# Patient Record
Sex: Female | Born: 1985 | Race: Black or African American | Hispanic: No | Marital: Married | Smoking: Never smoker
Health system: Southern US, Community
[De-identification: ages and names within clinical notes are randomized; demographics above are authoritative.]

## PROBLEM LIST (undated history)

## (undated) ENCOUNTER — Inpatient Hospital Stay: Payer: Self-pay

## (undated) ENCOUNTER — Ambulatory Visit

## (undated) DIAGNOSIS — Z8759 Personal history of other complications of pregnancy, childbirth and the puerperium: Secondary | ICD-10-CM

## (undated) DIAGNOSIS — Z9289 Personal history of other medical treatment: Secondary | ICD-10-CM

## (undated) DIAGNOSIS — D5932 Hereditary hemolytic-uremic syndrome: Secondary | ICD-10-CM

## (undated) DIAGNOSIS — N189 Chronic kidney disease, unspecified: Secondary | ICD-10-CM

## (undated) DIAGNOSIS — Z992 Dependence on renal dialysis: Secondary | ICD-10-CM

## (undated) DIAGNOSIS — E119 Type 2 diabetes mellitus without complications: Secondary | ICD-10-CM

## (undated) DIAGNOSIS — D649 Anemia, unspecified: Secondary | ICD-10-CM

## (undated) DIAGNOSIS — R112 Nausea with vomiting, unspecified: Secondary | ICD-10-CM

## (undated) DIAGNOSIS — T8859XA Other complications of anesthesia, initial encounter: Secondary | ICD-10-CM

## (undated) DIAGNOSIS — I10 Essential (primary) hypertension: Secondary | ICD-10-CM

## (undated) DIAGNOSIS — Z9889 Other specified postprocedural states: Secondary | ICD-10-CM

## (undated) HISTORY — PX: DILATION AND CURETTAGE OF UTERUS: SHX78

## (undated) HISTORY — DX: Essential (primary) hypertension: I10

## (undated) HISTORY — PX: TUBAL LIGATION: SHX77

## (undated) HISTORY — DX: Personal history of other complications of pregnancy, childbirth and the puerperium: Z87.59

## (undated) HISTORY — DX: Chronic kidney disease, unspecified: N18.9

---

## 2013-05-26 DIAGNOSIS — E103219 Type 1 diabetes mellitus with mild nonproliferative diabetic retinopathy with macular edema, unspecified eye: Secondary | ICD-10-CM | POA: Insufficient documentation

## 2013-07-13 DIAGNOSIS — Z8481 Family history of carrier of genetic disease: Secondary | ICD-10-CM | POA: Insufficient documentation

## 2013-12-18 ENCOUNTER — Encounter: Payer: Self-pay | Admitting: Obstetrics and Gynecology

## 2013-12-19 ENCOUNTER — Emergency Department: Payer: Self-pay | Admitting: Emergency Medicine

## 2013-12-25 ENCOUNTER — Encounter: Payer: Self-pay | Admitting: Maternal & Fetal Medicine

## 2013-12-25 LAB — PROTEIN / CREATININE RATIO, URINE
Creatinine, Urine: 216.4 mg/dL — ABNORMAL HIGH (ref 30.0–125.0)
Protein, Random Urine: 59 mg/dL — ABNORMAL HIGH (ref 0–12)
Protein/Creat. Ratio: 273 mg/gCREAT — ABNORMAL HIGH (ref 0–200)

## 2013-12-25 LAB — HEMOGLOBIN A1C: Hemoglobin A1C: 8 % — ABNORMAL HIGH (ref 4.2–6.3)

## 2013-12-25 LAB — COMPREHENSIVE METABOLIC PANEL
Albumin: 3.3 g/dL — ABNORMAL LOW (ref 3.4–5.0)
Alkaline Phosphatase: 65 U/L
Anion Gap: 8 (ref 7–16)
BUN: 10 mg/dL (ref 7–18)
Bilirubin,Total: 0.2 mg/dL (ref 0.2–1.0)
Calcium, Total: 8.7 mg/dL (ref 8.5–10.1)
Chloride: 104 mmol/L (ref 98–107)
Co2: 24 mmol/L (ref 21–32)
Creatinine: 0.59 mg/dL — ABNORMAL LOW (ref 0.60–1.30)
EGFR (African American): >60
EGFR (Non-African Amer.): >60
Glucose: 113 mg/dL — ABNORMAL HIGH (ref 65–99)
Osmolality: 272 (ref 275–301)
Potassium: 3.6 mmol/L (ref 3.5–5.1)
SGOT(AST): 13 U/L — ABNORMAL LOW (ref 15–37)
SGPT (ALT): 14 U/L
Sodium: 136 mmol/L (ref 136–145)
Total Protein: 7.6 g/dL (ref 6.4–8.2)

## 2013-12-25 LAB — TSH: Thyroid Stimulating Horm: 1.83 u[IU]/mL

## 2013-12-26 ENCOUNTER — Encounter: Payer: Self-pay | Admitting: Maternal & Fetal Medicine

## 2014-01-08 ENCOUNTER — Ambulatory Visit: Payer: Self-pay | Admitting: Obstetrics and Gynecology

## 2014-01-25 ENCOUNTER — Ambulatory Visit: Payer: Self-pay | Admitting: Obstetrics and Gynecology

## 2014-02-19 ENCOUNTER — Encounter: Payer: Self-pay | Admitting: Maternal & Fetal Medicine

## 2014-03-20 ENCOUNTER — Encounter: Payer: Self-pay | Admitting: Pediatric Cardiology

## 2014-04-27 DIAGNOSIS — Z8759 Personal history of other complications of pregnancy, childbirth and the puerperium: Secondary | ICD-10-CM

## 2014-04-27 HISTORY — DX: Personal history of other complications of pregnancy, childbirth and the puerperium: Z87.59

## 2014-06-07 ENCOUNTER — Observation Stay: Payer: Self-pay | Admitting: Obstetrics and Gynecology

## 2014-06-17 ENCOUNTER — Observation Stay: Payer: Self-pay | Admitting: Obstetrics and Gynecology

## 2014-06-26 ENCOUNTER — Inpatient Hospital Stay: Payer: Self-pay | Admitting: Obstetrics and Gynecology

## 2014-06-26 DIAGNOSIS — O1400 Mild to moderate pre-eclampsia, unspecified trimester: Secondary | ICD-10-CM

## 2014-08-18 NOTE — Consult Note (Signed)
Referral Information:  Reason for Referral diabetes in pregnancy- PT RESCHEDULED - SKELETON NOTE LEFT IN [place]   Referring Physician [facility]- Dr Rubie Maid   Prenatal Hx 29 yo AAF [phone] with LMP 10/11/13 [facility] 07/18/13 now at 9w 5 d  Pt has type 1 DM diagnosed at age 61 Stopped OCPs this spring as they made her feel poorly  Uses Novolog 70/30 (due to expense of levemir) last HGB A1c was 10, switched to NPH and regular at [facility]   Past Obstetrical Hx 1- female 5 lbs 5 oz spontaneous vaginal delivery 2-TAB 3-TAB 4- TAB 5- TAB 9- current   Perinatal Consult:  LMP 11-Oct-2013   PGyn Hx regular menses  came off OCPs   Past Medical History cont'd Tyoe 1 DM last ophth exam   Soc Hx single   Impression/Recommendations:  Impression NOTE LEFT in [place]  IUP at 9 w 5d  type 1 DM on NPH and regularm elevated hgb A1c at conception   Recommendations Detailed anatomy scan at 18 weeks Fetal echo at 22 weeks Check P/C ratio on spot urine for baseline  Check TSH  Review risk of hypoglycemia and hyperglycemia /DKA  discussneed for consistent diet (Lifestyle referral  ) and insulin use Monitoirng with thrid trimester monthly growth scan, twice weekly NSTs and delivery at 37-39 weeks depending on control   Plan:  Genetic Counseling yes   Prenatal Diagnosis Options Level II [place], fetal echo   Ultrasound at what gestational ages Monthly >24 weeks   Antepartum Testing Twice weekly, Starting at 32 weeks   Delivery Mode Vaginal   Additional Testing Thyroid panel, HbA1C   Delivery at what gestational age [redacted] weeks, earlier if fetopathy   Coding Description: MATERNAL CONDITIONS/HISTORY INDICATION(S).   Abnormal glucose tolerance - if known by anatomy scan.   Diabetes - DM.  Electronic Signatures: Sharyn Creamer (MD)  (Signed 24-Aug-15 15:50)  Authored: Referral, Home Medications, Consult, Impression, Plan, Coding Description   Last Updated: 24-Aug-15 15:50  by Sharyn Creamer (MD)

## 2014-08-18 NOTE — Consult Note (Signed)
Referral Information:  Reason for Referral 29 yo AAF [phone] with LMP 10/11/13 [facility] 07/18/13 now at [redacted]w[redacted]d gestation is referred for recommendations regarding management of type 1 DM in pregnancy   Referring Physician [facility]- Dr Rubie Maid   Prenatal Hx Pt has type 1 DM diagnosed at age 21 Stopped OCPs this spring as they made her feel poorly  Used Novolog 70/30 (due to expense of levemir) last HGB A1c was 10, switched to NPH and regular at [facility] Current regimen is: AM 24N/11R PM 8R HS 10N   Past Obstetrical Hx 1- female 5 lbs 5 oz spontaneous vaginal delivery at [redacted] weeks gestation 2-TAB 3-TAB 4- TAB 5- TAB 9- current   Home Medications: Medication Instructions Status  Prenatal Multivitamins Prenatal Multivitamins oral tablet 1 tab(s) orally once a day Active  HumuLIN N human recombinant 100 units/mL subcutaneous suspension 24 unit(s) subcutaneous once a day before breakfast Active  HumuLIN N human recombinant 100 units/mL subcutaneous suspension 10 unit(s) subcutaneous once a day (at bedtime) Active  HumuLIN R human recombinant 100 units/mL injectable solution 11 unit(s) injectable once a day before breakfast Active  HumuLIN R human recombinant 100 units/mL injectable solution 8 unit(s) injectable once a day before dinner Active   Allergies:   Other -Explain in Comment: Anaphylaxis  Morphine: Itching, Hallucinations  Bactrim: Hives  Vital Signs/Notes:  Nursing Vital Signs: **Vital Signs.:   31-Aug-15 15:52  Vital Signs Type Routine  Temperature Temperature (F) 97.2  Celsius 36.2  Temperature Source oral  Pulse Pulse 87  Respirations Respirations 18  Systolic BP Systolic BP [id number]  Diastolic BP (mmHg) Diastolic BP (mmHg) 68  Pulse Ox % Pulse Ox % 99  Pulse Ox Activity Level  At rest  Oxygen Delivery Room Air/ 21 %   Perinatal Consult:  LMP 11-Oct-2013   PGyn Hx regular menses  came off OCPs   Past Medical History cont'd Type 1 DM - diagnosed at age 42 at  school.  Was hospitalized for initial control.  Has never been admitted subsequently.  No episodes of DKA.  Has seen an internist periodically in [place].  Has not been followed by endocrinology. 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   Soc Hx single, no substances   Review Of Systems:  Fever/Chills No   Cough No   Abdominal Pain No   Diarrhea No   Constipation No   Nausea/Vomiting No   SOB/DOE No   Chest Pain No   Dysuria No   Tolerating Diet Yes   Medications/Allergies Reviewed Medications/Allergies reviewed   Exam:  Today's Weight 164lbs; BMI 26    Additional Lab/Radiology Notes Bedside US today - FHR 163  Blood sugar review: FBSs  100-285  12/12 > 95 2 hr PP breakfast  43-182   4/12 > 120 2 hr PP lunch 80-364  9/12 > 120 2 hr PP dinner 37-150  3/7 > 120 3 episodes of hypoglycemia (< 60) in last 3 weeks   Impression/Recommendations:  Impression 29 yo AAF [phone] with LMP 10/11/13 [facility] 07/18/13 now at [redacted]w[redacted]d gestation is referred for recommendations regarding management of type 1 DM in pregnancy. Hb A1c > 10 at conception.  Poor control on present regimen which patient believes is better control than she has had in  a long time.   Recommendations 1.  Detailed anatomy scan at 18 weeks - she was scheduled here at [facility] for this 2.  Fetal echo at 22 weeks 3.  P/C ratio today on spot urine for baseline  4. TSH today 5. CMP today 6.  Counseled about  hypoglycemia and hyperglycemia/DKA .   7. Because of hypoglycemia, I would not want to increase her insulin.  She believes her control is better now than on previous regimens, but we talked about smoothing out the variability with a steady basal dose and using a short acting insulin.   8.  We discussed the need for consistent  diet.  Lifestyle referral.  We are initiating. 9.  I am referring her to Dr. Gabriel Carina - endocrinologist at [facility] and hope that she can see the patient ASAP.10.  10. ASA 81 mg per day starting now - patient counseled about the safety and rationale. 11. Fetal surveillance  with third trimester monthly growth scan, twice weekly NSTs, weekly AFIs and delivery at 37-39 weeks depending on fetal growth and maternal control.  See below.   Plan:  Genetic Counseling Has appt for first trimester screening this Friday.   Prenatal Diagnosis Options Level II [place], plus fetal echo   Ultrasound at what gestational ages Monthly > 28 weeks   Antepartum Testing Twice weekly, Starting at 32 weeks   Delivery Mode Vaginal   Additional Testing Thyroid panel, HbA1C, P/C ratio, CMP - ordered today   Delivery at what gestational age [redacted] weeks, earlier if fetopathy or poor control   Comment/Plan Thank you for allowing [place] to participate in her care.    Total Time Spent with Patient 45 minutes   >50% of visit spent in couseling/coordination of care yes   Office Use Only [id number]  Level 3 (69min) NEW office consult detailed   Coding Description: MATERNAL CONDITIONS/HISTORY INDICATION(S).   Diabetes - DM.  Electronic Signatures: Dellia Nims (MD)  (Signed 31-Aug-15 17:37)  Authored: Referral, Home Medications, Allergies, Vital Signs/Notes, Consult, Exam, Lab/Radiology Notes, Impression, Plan, Billing, Coding Description   Last Updated: 31-Aug-15 17:37 by Dellia Nims (MD)

## 2014-08-20 LAB — SURGICAL PATHOLOGY

## 2014-09-04 NOTE — H&P (Signed)
L&D Evaluation:  History:  HPI Ms. Kelly Huff is a 29 y.o. [phone] single AAF, LMP 10/12/14, EDD 07/18/14, aat 37.0 weeks who presents for scheduled IOL for Type 1 DM (poorly controlled), and pre-eclampsia (without severe features).   Patient's Medical History Diabetes  Pre-eclampsia (without severe features), headaches,, UTI in pregnancy   Patient's Surgical History D&C  x 4   Medications Pre Natal Vitamins  Aspirin 81 mg, Fiorcet prn, NPH 20U in a.m., 6U in p.m., Humalog slidin scale   Allergies NKDA   Social History none   Family History Non-Contributory   ROS:  ROS All systems were reviewed.  HEENT, CNS, GI, GU, Respiratory, CV, Renal and Musculoskeletal systems were found to be normal.   Exam:  Vital Signs BPs 130s-150s/80s-90s   Urine Protein not completed   General no apparent distress   Mental Status clear   Chest clear   Heart normal sinus rhythm   Abdomen gravid, non-tender   Estimated Fetal Weight Average for gestational age   Fetal Position cephalic   Back no CVAT   Edema 2+  Pitting  up to mid shin bilaterally   Reflexes 2+   Pelvic no external lesions, 2/30/-3   Mebranes Intact   FHT normal rate with no decels, (however 2 decels noted from baseline to 120s lasting 30 secs ~ 1 hr after cytotec placement overnight at midnight)   Fetal Heart Rate 146   Ucx irregular, infrequent, q 15 min   Ucx Pain Scale 5   Skin no rashes   Lymph no lymphadenopathy   Other B+/-/HIV-/ND/NR/RI/VZI/GC-/Cl-/GBS-        Normal 1st trimester screen.  06/26/14: 7.9>9.7/29.3<189    LFTs wnl, uric acid 4.4.  Cr 0.71.  Pending Protein/Cr ratio Last HgbA1c 04/2014: 6.1   Impression:  Impression early labor   Plan:  Plan EFM/NST, monitor contractions and for cervical change, monitor BP, fluids   Comments - Admitted for IOL for pre-eclampsia (without severe features), and Type 1 DM (poorly controlled, with daily BS averaging from 40s to 200s, however HgbA1c  improved since onset of pregnancy).  - Initiate insulin drip protocol while in labor.  Accuchecks q 2 hrs, goal of BS<120. Most recent BS 137.  - Foley bulb placed this a.m., will begin on low dose pitocin.  - PIH labs overall wnl, still pending urine protein studies. Continue to monitor for s/s of severe pre-eclamspia. BPs labile.  - Stadol for pain until epidural desired.   Electronic Signatures: Augusto Gamble (MD)  (Signed [phone] 21:53)  Authored: L&D Evaluation   Last Updated: 02-Mar-16 21:53 by Augusto Gamble (MD)

## 2014-11-13 ENCOUNTER — Encounter: Payer: Self-pay | Admitting: Obstetrics and Gynecology

## 2014-12-09 ENCOUNTER — Other Ambulatory Visit: Payer: Self-pay | Admitting: Obstetrics and Gynecology

## 2015-04-09 ENCOUNTER — Ambulatory Visit: Payer: Self-pay | Admitting: Obstetrics and Gynecology

## 2015-08-13 ENCOUNTER — Emergency Department (HOSPITAL_COMMUNITY): Payer: Self-pay

## 2015-08-13 ENCOUNTER — Encounter (HOSPITAL_COMMUNITY): Payer: Self-pay | Admitting: Emergency Medicine

## 2015-08-13 ENCOUNTER — Emergency Department (HOSPITAL_COMMUNITY)
Admission: EM | Admit: 2015-08-13 | Discharge: 2015-08-13 | Disposition: A | Discharge status: Home / Self Care | Payer: Self-pay | Attending: Emergency Medicine | Admitting: Emergency Medicine

## 2015-08-13 DIAGNOSIS — S92352D Displaced fracture of fifth metatarsal bone, left foot, subsequent encounter for fracture with routine healing: Secondary | ICD-10-CM | POA: Insufficient documentation

## 2015-08-13 DIAGNOSIS — X58XXXD Exposure to other specified factors, subsequent encounter: Secondary | ICD-10-CM | POA: Insufficient documentation

## 2015-08-13 DIAGNOSIS — S92302D Fracture of unspecified metatarsal bone(s), left foot, subsequent encounter for fracture with routine healing: Secondary | ICD-10-CM

## 2015-08-13 DIAGNOSIS — E119 Type 2 diabetes mellitus without complications: Secondary | ICD-10-CM | POA: Insufficient documentation

## 2015-08-13 HISTORY — DX: Type 2 diabetes mellitus without complications: E11.9

## 2015-08-13 MED ORDER — OXYCODONE-ACETAMINOPHEN 5-325 MG PO TABS: 1.0000 | ORAL_TABLET | Freq: Three times a day (TID) | ORAL | Status: DC | PRN

## 2015-08-13 NOTE — ED Notes (Signed)
Left foot fx while in [place] 4/12 and was told she had a fracture. Told to f/u with ortho. Pt reports pain has gotten increasingly worse.

## 2015-08-13 NOTE — Discharge Instructions (Signed)
Please read and follow all provided instructions.  Your diagnoses today include:  1. Fracture of 5th metatarsal, left, with routine healing, subsequent encounter    Tests performed today include:  Vital signs. See below for your results today.   Medications prescribed:   Take as prescribed  You have been prescribed a narcotic medication on an "as needed" basis. Take only as prescribed. Do not drive, operate any machinery or make any important decisions while taking this medication as it is sedating. It may cause constipation take over the counter stool softeners or add fiber to your diet to treat this (Metamucil, Psyllium Fiber, Colace, Miralax) Further refills will need to be obtained from your primary care doctor and will not be prescribed through the Emergency Department. You will test positive on most drug tests while taking this medciation.   Home care instructions:  Follow any educational materials contained in this packet.  Follow-up instructions: Please follow-up with Orthopedics for further evaluation of symptoms and treatment   Return instructions:   Please return to the Emergency Department if you do not get better, if you get worse, or new symptoms OR  - Fever (temperature greater than 101.37F)  - Bleeding that does not stop with holding pressure to the area    -Severe pain (please note that you may be more sore the day after your accident)  - Chest Pain  - Difficulty breathing  - Severe nausea or vomiting  - Inability to tolerate food and liquids  - Passing out  - Skin becoming red around your wounds  - Change in mental status (confusion or lethargy)  - New numbness or weakness     Please return if you have any other emergent concerns.  Additional Information:  Your vital signs today were: BP 153/89 mmHg   Pulse 96   Temp(Src) 98.1 F (36.7 C) (Oral)   Resp 18   Ht 5\' 7"  (1.702 m)   Wt 72.576 kg   BMI 25.05 kg/m2   SpO2 100%   LMP 06/26/2015 If your blood  pressure (BP) was elevated above 135/85 this visit, please have this repeated by your doctor within one month. ---------------

## 2015-08-13 NOTE — ED Provider Notes (Signed)
History  By signing my name below, I, Marlowe Kays, attest that this documentation has been prepared under the direction and in the presence of Shary Decamp, PA-C. Electronically Signed: Marlowe Kays, ED Scribe. 08/13/2015. 1:48 PM.  No chief complaint on file.  The history is provided by the patient and medical records. No language interpreter was used.    HPI Comments:  Kelly Huff is a 30 y.o. female with PMHx of DM who presents to the Emergency Department complaining of a left ankle injury that occurred six days ago in [place]. She states she was visiting and was seen in an ED there and was told the foot was fractured. She rates the pain at 10/10. A splint was applied and crutches were provided and she reports she has been nonweightbearing. Pt has been taking Ibuprofen with minimal relief of the pain. She denies modifying factors. She denies numbness, tingling or weakness of the left foot or LLE, SOB, CP, nausea, vomiting, fever or chills. Pt was told to follow up with an orthopedist but she has not done so yet because her referral is for a specialist in [place] and she needs a local referral.  Past Medical History  Diagnosis Date  . Diabetes mellitus without complication ([place])    History reviewed. No pertinent past surgical history. No family history on file. Social History  Substance Use Topics  . Smoking status: Never Smoker   . Smokeless tobacco: None  . Alcohol Use: No   OB History    No data available     Review of Systems A complete 10 system review of systems was obtained and all systems are negative except as noted in the HPI and PMH.   Allergies  Biaxin; Hydrocodone; and Morphine and related  Home Medications   Prior to Admission medications   Medication Sig Start Date End Date Taking? Authorizing Provider  HYDROcodone-acetaminophen (NORCO) 10-325 MG tablet Take 1 tablet by mouth every 6 (six) hours as needed.   Yes Historical Provider, MD   Triage  Vitals: BP 153/89 mmHg  Pulse 96  Temp(Src) 98.1 F (36.7 C) (Oral)  Resp 18  Ht 5\' 7"  (1.702 m)  Wt 160 lb (72.576 kg)  BMI 25.05 kg/m2  SpO2 100%  Physical Exam  Constitutional: She is oriented to person, place, and time. She appears well-developed and well-nourished.  HENT:  Head: Normocephalic and atraumatic.  Eyes: EOM are normal.  Neck: Normal range of motion.  Cardiovascular: Normal rate and regular rhythm.   Pulmonary/Chest: Effort normal.  Abdominal: Soft.  Musculoskeletal: Normal range of motion.  Left ankle placed in split. Cap refill intact. Motor/sensation intact. TTP along 5th metatarsal.   Neurological: She is alert and oriented to person, place, and time.  Skin: Skin is warm and dry.  Psychiatric: She has a normal mood and affect. Her behavior is normal. Thought content normal.  Nursing note and vitals reviewed.  ED Course  Procedures (including critical care time) DIAGNOSTIC STUDIES: Oxygen Saturation is 100% on RA, normal by my interpretation.   COORDINATION OF CARE: 1:46 PM- Will wait for X-Ray to result and refer to local orthopedist. Pt verbalizes understanding and agrees to plan.  Medications - No data to display  Labs Review Labs Reviewed - No data to display  Imaging Review Dg Foot Complete Left  08/13/2015  CLINICAL DATA:  Fracture of left foot 6 days ago with worsening pain. EXAM: LEFT FOOT - COMPLETE 3+ VIEW COMPARISON:  None. FINDINGS: Fine bony detail obscured  by overlying fiberglass. Fracture involving the base of the fifth metatarsal is evident. No other acute fracture is noted on the current study. No worrisome lytic or sclerotic osseous abnormality. IMPRESSION: Fracture involving the base of the fifth metatarsal. Electronically Signed   By: Misty Stanley M.D.   On: 08/13/2015 13:46   I have personally reviewed and evaluated these images and lab results as part of my medical decision-making.   EKG Interpretation None      MDM  I have  reviewed and evaluated the relevant laboratory values. I have reviewed and evaluated the relevant imaging studies. I have reviewed the relevant previous healthcare records. I obtained HPI from historian.  ED Course:  Assessment: Pt is a 29yF who presents with left foot pain since previous accident. Seen in ED in [place] and had fracture. Here today because she needs referral to Ortho as they referred her to Ortho in [place]. On exam, pt in NAD. Nontoxic/nonseptic appearing. VS. Afebrile. Pt in splint from previous ED. Cap refill intact. Sensation intact. XR shows fx involving base of 5th metatarsal. Continue NWB with crutches. Given analgesia. Plan is to follow up with orthopedics. At time of discharge, Patient is in no acute distress. Vital Signs are stable. Patient is able to ambulate. Patient able to tolerate PO.    Disposition/Plan:  DC Home Additional Verbal discharge instructions given and discussed with patient.  Return precautions given Pt acknowledges and agrees with plan  Supervising Physician Deno Etienne, DO   Final diagnoses:  Fracture of 5th metatarsal, left, with routine healing, subsequent encounter    I personally performed the services described in this documentation, which was scribed in my presence. The recorded information has been reviewed and is accurate.   Shary Decamp, PA-C 08/13/15 [place], DO 08/13/15 1438

## 2015-10-31 ENCOUNTER — Other Ambulatory Visit: Payer: Self-pay | Admitting: Sports Medicine

## 2015-10-31 DIAGNOSIS — S92355D Nondisplaced fracture of fifth metatarsal bone, left foot, subsequent encounter for fracture with routine healing: Secondary | ICD-10-CM

## 2015-11-06 ENCOUNTER — Ambulatory Visit
Admission: RE | Admit: 2015-11-06 | Discharge: 2015-11-06 | Disposition: A | Discharge status: Home / Self Care | Payer: Medicaid Other | Source: Ambulatory Visit | Attending: Sports Medicine | Admitting: Sports Medicine

## 2015-11-06 DIAGNOSIS — S92355D Nondisplaced fracture of fifth metatarsal bone, left foot, subsequent encounter for fracture with routine healing: Secondary | ICD-10-CM

## 2015-11-20 ENCOUNTER — Ambulatory Visit (INDEPENDENT_AMBULATORY_CARE_PROVIDER_SITE_OTHER): Payer: Medicaid Other | Admitting: Obstetrics and Gynecology

## 2015-11-20 ENCOUNTER — Encounter: Payer: Self-pay | Admitting: Obstetrics and Gynecology

## 2015-11-20 VITALS — BP 137/73 | HR 109 | Ht 67.0 in | Wt 161.0 lb

## 2015-11-20 DIAGNOSIS — Z3046 Encounter for surveillance of implantable subdermal contraceptive: Secondary | ICD-10-CM

## 2015-11-20 NOTE — Progress Notes (Signed)
     GYNECOLOGY CLINIC PROCEDURE NOTE  Kelly Huff is a 30 y.o. [phone] here for Nexplanon removal.  Concerning side effects include weight gain, heavier irregular menses, mood changes, and soreness in arm.  Last pap smear was on 12/2013 (?) and was normal.  No other gynecologic concerns. Patient's last menstrual period was 11/06/2015 (approximate).  Nexplanon Removal Patient identified, informed consent performed, consent signed.   Appropriate time out taken. Nexplanon site identified.  Area prepped in usual sterile fashon. One ml of 1% lidocaine was used to anesthetize the area at the distal end of the implant. A small stab incision was made right beside the implant on the distal portion.  The Nexplanon rod was grasped using hemostats and removed without difficulty.  There was minimal blood loss. There were no complications.  2 ml of 1% lidocaine was injected around the incision for post-procedure analgesia.  Steri-strips were applied over the small incision.  A pressure bandage was applied to reduce any bruising.  The patient tolerated the procedure well and was given post procedure instructions.  Patient desires to be hormone free for 1-3 months to allow body to self regulate, and then is unsure of future contraceptive plans (but considering OCPs).   Rubie Maid, MD [facility]

## 2016-01-29 ENCOUNTER — Ambulatory Visit (INDEPENDENT_AMBULATORY_CARE_PROVIDER_SITE_OTHER): Payer: Medicaid Other | Admitting: Obstetrics and Gynecology

## 2016-01-29 ENCOUNTER — Encounter: Payer: Self-pay | Admitting: Obstetrics and Gynecology

## 2016-01-29 VITALS — BP 134/85 | HR 86 | Ht 67.0 in | Wt 179.6 lb

## 2016-01-29 DIAGNOSIS — Z01419 Encounter for gynecological examination (general) (routine) without abnormal findings: Secondary | ICD-10-CM

## 2016-01-29 DIAGNOSIS — E109 Type 1 diabetes mellitus without complications: Secondary | ICD-10-CM

## 2016-01-29 DIAGNOSIS — E8770 Fluid overload, unspecified: Secondary | ICD-10-CM | POA: Insufficient documentation

## 2016-01-29 DIAGNOSIS — Z0001 Encounter for general adult medical examination with abnormal findings: Secondary | ICD-10-CM | POA: Diagnosis not present

## 2016-01-29 DIAGNOSIS — R6889 Other general symptoms and signs: Secondary | ICD-10-CM | POA: Insufficient documentation

## 2016-01-29 DIAGNOSIS — Z113 Encounter for screening for infections with a predominantly sexual mode of transmission: Secondary | ICD-10-CM

## 2016-01-29 DIAGNOSIS — Z3009 Encounter for other general counseling and advice on contraception: Secondary | ICD-10-CM

## 2016-01-29 NOTE — Progress Notes (Signed)
GYNECOLOGY ANNUAL PHYSICAL EXAM PROGRESS NOTE  Subjective:    Kelly Huff is a 30 y.o. [phone] female who presents for an annual exam. The patient is not currently sexually active. The patient wears seatbelts: yes. The patient participates in regular exercise: no. Has the patient ever been transfused or tattooed?: yes, tattoos. The patient reports that there is not domestic violence in her life.   The patient wishes to address the following today:  1. Notes that she needs a referral to another Endocrinologist.  Notes that she was dismissed from previous practice at [facility] due to having to reschedule too many appointments.  2. Patient desires to discuss her options for contraception.  Currently not sexually active, but plans to be in the near future. Also notes that she desires STD testing as she found out that her last partner was unfaithful.   Gynecologic History Menarche age: 89 Patient's last menstrual period was 01/22/2016 (approximate). Contraception: condoms (however uses inconsistently) History of STI's: Denies Last Pap: 12/2013. Results were: normal.  Denies h/o abnormal pap smears.    Obstetric History   G6   P2   T2   P0   A4   L0    SAB4   TAB0   Ectopic0   Multiple0   Live Births2     # Outcome Date GA Lbr Len/2nd Weight Sex Delivery Anes PTL Lv  6 Term 06/2014 [redacted]w[redacted]d    Vag-Spont        Complications: Mild pre-eclampsia  5 TAB           4 TAB           3 TAB           2 TAB           1 Term               Past Medical History:  Diagnosis Date  . Diabetes mellitus without complication [facility])     Past Surgical History:  Procedure Laterality Date  . DILATION AND CURETTAGE OF UTERUS     x 4    Family History  Problem Relation Age of Onset  . Hypertension Mother   . Cancer Mother     Uterine vs cervical (pt unsure),   . Schizophrenia Brother     Social History   Social History  . Marital status: Single    Spouse name: N/A  . Number of  children: N/A  . Years of education: N/A   Occupational History  . Not on file.   Social History Main Topics  . Smoking status: Never Smoker  . Smokeless tobacco: Never Used  . Alcohol use No  . Drug use: No  . Sexual activity: Yes    Birth control/ protection: Implant   Other Topics Concern  . Not on file   Social History Narrative  . No narrative on file    No current outpatient prescriptions on file prior to visit.   No current facility-administered medications on file prior to visit.     Allergies  Allergen Reactions  . Morphine     Other reaction(s): Hallucination  . Sulfamethoxazole-Trimethoprim Hives  . Biaxin [Clarithromycin]   . Hydrocodone Itching  . Morphine And Related     Review of Systems Constitutional: negative for chills, fatigue, fevers and sweats Eyes: negative for irritation, redness and visual disturbance Ears, nose, mouth, throat, and face: negative for hearing loss, nasal congestion, snoring and tinnitus Respiratory: negative for asthma,  cough, sputum Cardiovascular: negative for chest pain, dyspnea, exertional chest pressure/discomfort, irregular heart beat, palpitations and syncope Gastrointestinal: negative for abdominal pain, change in bowel habits, nausea and vomiting Genitourinary: negative for abnormal menstrual periods, genital lesions, sexual problems and vaginal discharge, dysuria and urinary incontinence Integument/breast: negative for breast lump, breast tenderness and nipple discharge Hematologic/lymphatic: negative for bleeding and easy bruising Musculoskeletal:negative for back pain and muscle weakness Neurological: negative for dizziness, headaches, vertigo and weakness Endocrine: negative for diabetic symptoms including polydipsia, polyuria and skin dryness Allergic/Immunologic: negative for hay fever and urticaria       Objective:  Blood pressure 134/85, pulse 86, height 5\' 7"  (1.702 m), weight 179 lb 9.6 oz (81.5 kg),  last menstrual period 01/22/2016. Body mass index is 28.13 kg/m.  General Appearance:    Alert, cooperative, no distress, appears stated age  Head:    Normocephalic, without obvious abnormality, atraumatic  Eyes:    PERRL, conjunctiva/corneas clear, EOM's intact, both eyes  Ears:    Normal external ear canals, both ears  Nose:   Nares normal, septum midline, mucosa normal, no drainage or sinus tenderness  Throat:   Lips, mucosa, and tongue normal; teeth and gums normal  Neck:   Supple, symmetrical, trachea midline, no adenopathy; thyroid: no enlargement/tenderness/nodules; no carotid bruit or JVD  Back:     Symmetric, no curvature, ROM normal, no CVA tenderness  Lungs:     Clear to auscultation bilaterally, respirations unlabored  Chest Wall:    No tenderness or deformity   Heart:    Regular rate and rhythm, S1 and S2 normal, no murmur, rub or gallop  Breast Exam:    No tenderness, masses, or nipple abnormality  Abdomen:     Soft, non-tender, bowel sounds active all four quadrants, no masses, no organomegaly.    Genitalia:    Pelvic:external genitalia normal, vagina without lesions, discharge, or tenderness, rectovaginal septum  normal. Cervix normal in appearance, no cervical motion tenderness, no adnexal masses or tenderness.  Uterus normal size, shape, mobile, regular contours, nontender.  Rectal:    Normal external sphincter.  No hemorrhoids appreciated. Internal exam not done.   Extremities:   Extremities normal, atraumatic, no cyanosis or edema  Pulses:   2+ and symmetric all extremities  Skin:   Skin color, texture, turgor normal, no rashes or lesions  Lymph nodes:   Cervical, supraclavicular, and axillary nodes normal  Neurologic:   CNII-XII intact, normal strength, sensation and reflexes throughout   .  Labs:  No results found for: WBC, HGB, HCT, MCV, PLT  Lab Results  Component Value Date   CREATININE 0.59 (L) 12/25/2013   BUN 10 12/25/2013   NA 136 12/25/2013   K 3.6  12/25/2013   CL 104 12/25/2013   CO2 24 12/25/2013    Lab Results  Component Value Date   ALT 14 12/25/2013   AST 13 (L) 12/25/2013   ALKPHOS 65 12/25/2013   BILITOT 0.2 12/25/2013    Lab Results  Component Value Date   TSH 1.83 12/25/2013     Assessment:   Healthy female exam.   Type 1 DM Desires STD screening Contraception counseling Family h/o cancer   Plan:    - Blood tests: CBC with diff, Comprehensive metabolic panel and [id number]. - Breast self exam technique reviewed and patient encouraged to perform self-exam monthly. - Contraception: currently abstinent, was using condoms but desires to discuss contraception. Education given regarding options for contraception, including injectable contraception, IUD placement, oral contraceptives,  Nuvaring, Implanon. Patient unsure, would like to start OCPs for now until final decision was made.  Given samples of [place]. To begin on Sunday of next menses. - Discussed healthy lifestyle modifications. - Pap smear up to date.  Next pap smear due next year.  - Desires to postpone flu vaccine. May get it at work.  - Referral placed for new Endocrinologist for management of Type 1 DM.  - STD screening performed today at patient's request.  - Patient uncertain which type of cancer her mother has, however is a reproductive cancer, currently having a recurrence requiring a mastectomy.  Discussed genetic cancer testing, encouraged patient to find out which type her mother has to see if her mother or patient is a candidate for testing.     Rubie Maid, MD [facility]

## 2016-01-30 LAB — COMPREHENSIVE METABOLIC PANEL
ALT: 11 IU/L (ref 0–32)
AST: 14 IU/L (ref 0–40)
Albumin/Globulin Ratio: 1.2 (ref 1.2–2.2)
Albumin: 3.9 g/dL (ref 3.5–5.5)
Alkaline Phosphatase: 112 IU/L (ref 39–117)
BUN/Creatinine Ratio: 21 (ref 9–23)
BUN: 20 mg/dL (ref 6–20)
Bilirubin Total: <0.2 mg/dL (ref 0.0–1.2)
CO2: 22 mmol/L (ref 18–29)
Calcium: 9.2 mg/dL (ref 8.7–10.2)
Chloride: 102 mmol/L (ref 96–106)
Creatinine, Ser: 0.95 mg/dL (ref 0.57–1.00)
GFR calc Af Amer: 92 mL/min/{1.73_m2} (ref >59)
GFR calc non Af Amer: 80 mL/min/{1.73_m2} (ref >59)
Globulin, Total: 3.3 g/dL (ref 1.5–4.5)
Glucose: 176 mg/dL — ABNORMAL HIGH (ref 65–99)
Potassium: 4.1 mmol/L (ref 3.5–5.2)
Sodium: 139 mmol/L (ref 134–144)
Total Protein: 7.2 g/dL (ref 6.0–8.5)

## 2016-01-30 LAB — HEPATITIS B SURFACE ANTIGEN: Hepatitis B Surface Ag: NEGATIVE

## 2016-01-30 LAB — HEMOGLOBIN A1C
Est. average glucose Bld gHb Est-mCnc: 212 mg/dL
Hgb A1c MFr Bld: 9 % — ABNORMAL HIGH (ref 4.8–5.6)

## 2016-01-30 LAB — CBC
Hematocrit: 31.1 % — ABNORMAL LOW (ref 34.0–46.6)
Hemoglobin: 10.3 g/dL — ABNORMAL LOW (ref 11.1–15.9)
MCH: 27.7 pg (ref 26.6–33.0)
MCHC: 33.1 g/dL (ref 31.5–35.7)
MCV: 84 fL (ref 79–97)
Platelets: 376 10*3/uL (ref 150–379)
RBC: 3.72 x10E6/uL — ABNORMAL LOW (ref 3.77–5.28)
RDW: 13.3 % (ref 12.3–15.4)
WBC: 5 10*3/uL (ref 3.4–10.8)

## 2016-01-30 LAB — HIV ANTIBODY (ROUTINE TESTING W REFLEX): HIV Screen 4th Generation wRfx: NONREACTIVE

## 2016-01-30 LAB — MICROALBUMIN / CREATININE URINE RATIO
Creatinine, Urine: 139.7 mg/dL
Microalb/Creat Ratio: 633.9 mg/g creat — ABNORMAL HIGH (ref 0.0–30.0)
Microalbumin, Urine: 885.6 ug/mL

## 2016-01-30 LAB — RPR: RPR Ser Ql: NONREACTIVE

## 2016-01-31 ENCOUNTER — Encounter: Payer: Self-pay | Admitting: Obstetrics and Gynecology

## 2016-02-01 LAB — GC/CHLAMYDIA PROBE AMP
Chlamydia trachomatis, NAA: NEGATIVE
Neisseria gonorrhoeae by PCR: NEGATIVE

## 2016-02-04 ENCOUNTER — Telehealth: Payer: Self-pay

## 2016-02-04 DIAGNOSIS — R7309 Other abnormal glucose: Secondary | ICD-10-CM

## 2016-02-04 NOTE — Telephone Encounter (Signed)
Called pt, no answer. LM for pt informing her of information below. Referral placed.

## 2016-02-04 NOTE — Telephone Encounter (Signed)
-----   Message from Rubie Maid, MD sent at 02/02/2016 12:48 AM EDT ----- Please inform patient of the following lab abnormalities:  1) Hgb A1c is elevated (currently at 9.0). Please f/u with referral for new endocrinologist.  2) Hgb is borderline low (she is slightly anemic). Would recommend a daily iron supplement for 2 months.   All other labs, including STD screening is normal.

## 2017-01-05 ENCOUNTER — Ambulatory Visit (INDEPENDENT_AMBULATORY_CARE_PROVIDER_SITE_OTHER): Payer: Medicaid Other | Admitting: Obstetrics and Gynecology

## 2017-01-05 ENCOUNTER — Encounter: Payer: Self-pay | Admitting: Obstetrics and Gynecology

## 2017-01-05 VITALS — BP 139/83 | HR 89 | Ht 67.0 in | Wt 199.4 lb

## 2017-01-05 DIAGNOSIS — Z202 Contact with and (suspected) exposure to infections with a predominantly sexual mode of transmission: Secondary | ICD-10-CM

## 2017-01-05 DIAGNOSIS — B9689 Other specified bacterial agents as the cause of diseases classified elsewhere: Secondary | ICD-10-CM

## 2017-01-05 DIAGNOSIS — N760 Acute vaginitis: Secondary | ICD-10-CM | POA: Diagnosis not present

## 2017-01-05 DIAGNOSIS — N898 Other specified noninflammatory disorders of vagina: Secondary | ICD-10-CM

## 2017-01-05 MED ORDER — METRONIDAZOLE 0.75 % VA GEL: 1.0000 | Freq: Every day | VAGINAL | 0 refills | Status: DC

## 2017-01-05 NOTE — Progress Notes (Signed)
Chief complaint: 1. STD exposure 2. Vaginal discharge  Patient recently broke up with her fianc because of infidelity. Patient now notes vaginal discharge without significant itching burning or odor.  Past medical history, past surgical history, problem list, medications, and allergies are reviewed  Review of Systems  Constitutional: Negative for chills, diaphoresis, fever and malaise/fatigue.  Respiratory: Negative.   Cardiovascular: Negative.   Gastrointestinal: Negative.  Negative for abdominal pain.  Genitourinary: Negative for dysuria and urgency.  Musculoskeletal: Negative.   Skin: Negative for itching and rash.  Neurological: Negative.   Endo/Heme/Allergies: Negative.   Psychiatric/Behavioral: Negative.    OBJECTIVE: BP 139/83   Pulse 89   Ht 5\' 7"  (1.702 m)   Wt 199 lb 6.4 oz (90.4 kg)   LMP 11/29/2016 (Approximate)   BMI 31.23 kg/m  Pleasant female in no acute distress. Slightly anxious. Abdomen: Soft, nontender Pelvic exam: External genitalia-normal BUS-normal Vagina-minimal white secretions; no lesions Cervix-no lesions; no cervical motion tenderness Uterus-midplane, nontender, mobile Adnexa-nonpalpable and nontender Rectovaginal-normal external exam  PROCEDURE: Wet prep KOH-negative for yeast; positive whiff test Normal saline-clue cells present; no significant white blood cells; no Trichomonas   ASSESSMENT: 1. STD exposure 2. Suspected bacterial vaginosis  PLAN: 1. Nu swab plus 2. Wet prep is noted 3. Blood STI screen 4. MetroGel intravaginal daily 5 days 5. Follow-up as needed 6. Questions regarding STDs addressed including signs and symptoms of herpes.  A total of 15 minutes were spent face-to-face with the patient during this encounter and over half of that time dealt with counseling and coordination of care.  Brayton Mars, MD  Note: This dictation was prepared with Dragon dictation along with smaller phrase technology. Any  transcriptional errors that result from this process are unintentional.

## 2017-01-05 NOTE — Patient Instructions (Addendum)
1. STD testing is completed today 2. Wet prep is suspicious for bacterial vaginosis 3. MetroGel cream intravaginal daily for 5 days is prescribed 4. Labs will be made available to patient  Sexually Transmitted Disease A sexually transmitted disease (STD) is a disease or infection that may be passed (transmitted) from person to person, usually during sexual activity. This may happen by way of saliva, semen, blood, vaginal mucus, or urine. Common STDs include:  Gonorrhea.  Chlamydia.  Syphilis.  HIV and AIDS.  Genital herpes.  Hepatitis B and C.  Trichomonas.  Human papillomavirus (HPV).  Pubic lice.  Scabies.  Mites.  Bacterial vaginosis.  What are the causes? An STD may be caused by bacteria, a virus, or parasites. STDs are often transmitted during sexual activity if one person is infected. However, they may also be transmitted through nonsexual means. STDs may be transmitted after:  Sexual intercourse with an infected person.  Sharing sex toys with an infected person.  Sharing needles with an infected person or using unclean piercing or tattoo needles.  Having intimate contact with the genitals, mouth, or rectal areas of an infected person.  Exposure to infected fluids during birth.  What are the signs or symptoms? Different STDs have different symptoms. Some people may not have any symptoms. If symptoms are present, they may include:  Painful or bloody urination.  Pain in the pelvis, abdomen, vagina, anus, throat, or eyes.  A skin rash, itching, or irritation.  Growths, ulcerations, blisters, or sores in the genital and anal areas.  Abnormal vaginal discharge with or without bad odor.  Penile discharge in men.  Fever.  Pain or bleeding during sexual intercourse.  Swollen glands in the groin area.  Yellow skin and eyes (jaundice). This is seen with hepatitis.  Swollen testicles.  Infertility.  Sores and blisters in the mouth.  How is this  diagnosed? To make a diagnosis, your health care provider may:  Take a medical history.  Perform a physical exam.  Take a sample of any discharge to examine.  Swab the throat, cervix, opening to the penis, rectum, or vagina for testing.  Test a sample of your first morning urine.  Perform blood tests.  Perform a Pap test, if this applies.  Perform a colposcopy.  Perform a laparoscopy.  How is this treated? Treatment depends on the STD. Some STDs may be treated but not cured.  Chlamydia, gonorrhea, trichomonas, and syphilis can be cured with antibiotic medicine.  Genital herpes, hepatitis, and HIV can be treated, but not cured, with prescribed medicines. The medicines lessen symptoms.  Genital warts from HPV can be treated with medicine or by freezing, burning (electrocautery), or surgery. Warts may come back.  HPV cannot be cured with medicine or surgery. However, abnormal areas may be removed from the cervix, vagina, or vulva.  If your diagnosis is confirmed, your recent sexual partners need treatment. This is true even if they are symptom-free or have a negative culture or evaluation. They should not have sex until their health care providers say it is okay.  Your health care provider may test you for infection again 3 months after treatment.  How is this prevented? Take these steps to reduce your risk of getting an STD:  Use latex condoms, dental dams, and water-soluble lubricants during sexual activity. Do not use petroleum jelly or oils.  Avoid having multiple sex partners.  Do not have sex with someone who has other sex partners.  Do not have sex with anyone  you do not know or who is at high risk for an STD.  Avoid risky sex practices that can break your skin.  Do not have sex if you have open sores on your mouth or skin.  Avoid drinking too much alcohol or taking illegal drugs. Alcohol and drugs can affect your judgment and put you in a vulnerable  position.  Avoid engaging in oral and anal sex acts.  Get vaccinated for HPV and hepatitis. If you have not received these vaccines in the past, talk to your health care provider about whether one or both might be right for you.  If you are at risk of being infected with HIV, it is recommended that you take a prescription medicine daily to prevent HIV infection. This is called pre-exposure prophylaxis (PrEP). You are considered at risk if: ? You are a man who has sex with other men (MSM). ? You are a heterosexual man or woman and are sexually active with more than one partner. ? You take drugs by injection. ? You are sexually active with a partner who has HIV.  Talk with your health care provider about whether you are at high risk of being infected with HIV. If you choose to begin PrEP, you should first be tested for HIV. You should then be tested every 3 months for as long as you are taking PrEP.  Contact a health care provider if:  See your health care provider.  Tell your sexual partner(s). They should be tested and treated for any STDs.  Do not have sex until your health care provider says it is okay. Get help right away if: Contact your health care provider right away if:  You have severe abdominal pain.  You are a man and notice swelling or pain in your testicles.  You are a woman and notice swelling or pain in your vagina.  This information is not intended to replace advice given to you by your health care provider. Make sure you discuss any questions you have with your health care provider. Document Released: 07/04/2002 Document Revised: 11/01/2015 Document Reviewed: 11/01/2012 Elsevier Interactive Patient Education  2018 [employer].

## 2017-01-06 LAB — HSV(HERPES SIMPLEX VRS) I + II AB-IGG
HSV 1 Glycoprotein G Ab, IgG: 49.7 index — ABNORMAL HIGH (ref 0.00–0.90)
HSV 2 IgG, Type Spec: 12.4 index — ABNORMAL HIGH (ref 0.00–0.90)

## 2017-01-06 LAB — HEPATITIS C ANTIBODY: Hep C Virus Ab: 0.2 s/co ratio (ref 0.0–0.9)

## 2017-01-06 LAB — HIV ANTIBODY (ROUTINE TESTING W REFLEX): HIV Screen 4th Generation wRfx: NONREACTIVE

## 2017-01-06 LAB — HEPATITIS B SURFACE ANTIGEN: Hepatitis B Surface Ag: NEGATIVE

## 2017-01-06 LAB — RPR: RPR Ser Ql: NONREACTIVE

## 2017-01-07 ENCOUNTER — Encounter: Payer: Self-pay | Admitting: Obstetrics and Gynecology

## 2017-01-07 LAB — NUSWAB VAGINITIS PLUS (VG+)
Atopobium vaginae: HIGH Score — AB
BVAB 2: HIGH Score — AB
Candida albicans, NAA: NEGATIVE
Candida glabrata, NAA: NEGATIVE
Chlamydia trachomatis, NAA: NEGATIVE
Megasphaera 1: HIGH Score — AB
Neisseria gonorrhoeae, NAA: NEGATIVE
Trich vag by NAA: NEGATIVE

## 2017-01-19 ENCOUNTER — Encounter: Payer: Medicaid Other | Admitting: Obstetrics and Gynecology

## 2017-01-21 ENCOUNTER — Encounter: Payer: Self-pay | Admitting: Obstetrics and Gynecology

## 2017-01-21 ENCOUNTER — Ambulatory Visit (INDEPENDENT_AMBULATORY_CARE_PROVIDER_SITE_OTHER): Payer: Medicaid Other | Admitting: Obstetrics and Gynecology

## 2017-01-21 VITALS — BP 143/93 | HR 83 | Ht 67.0 in | Wt 198.0 lb

## 2017-01-21 DIAGNOSIS — E109 Type 1 diabetes mellitus without complications: Secondary | ICD-10-CM | POA: Diagnosis not present

## 2017-01-21 DIAGNOSIS — E669 Obesity, unspecified: Secondary | ICD-10-CM | POA: Diagnosis not present

## 2017-01-21 DIAGNOSIS — Z124 Encounter for screening for malignant neoplasm of cervix: Secondary | ICD-10-CM

## 2017-01-21 DIAGNOSIS — N938 Other specified abnormal uterine and vaginal bleeding: Secondary | ICD-10-CM | POA: Diagnosis not present

## 2017-01-21 DIAGNOSIS — Z0000 Encounter for general adult medical examination without abnormal findings: Secondary | ICD-10-CM | POA: Diagnosis not present

## 2017-01-21 DIAGNOSIS — I10 Essential (primary) hypertension: Secondary | ICD-10-CM

## 2017-01-21 DIAGNOSIS — Z01419 Encounter for gynecological examination (general) (routine) without abnormal findings: Secondary | ICD-10-CM | POA: Diagnosis not present

## 2017-01-21 MED ORDER — TRANEXAMIC ACID 650 MG PO TABS: 1300.0000 mg | ORAL_TABLET | Freq: Three times a day (TID) | ORAL | 2 refills | Status: DC

## 2017-01-21 NOTE — Addendum Note (Signed)
Addended by: Donalee Citrin on: 01/21/2017 09:41 AM   Modules accepted: Orders

## 2017-01-21 NOTE — Progress Notes (Signed)
GYNECOLOGY ANNUAL PHYSICAL EXAM PROGRESS NOTE  Subjective:    Kelly Huff is a 31 y.o. [phone] female who presents for an annual exam. The patient is not currently sexually active. The patient wears seatbelts: yes. The patient participates in regular exercise: no. Has the patient ever been transfused or tattooed?: yes, tattoos. The patient reports that there is not domestic violence in her life.   The patient wishes to address the following today:  1. Patient complains of heavy periods, with passage of clots.  This has been ongoing for the past year (after Nexplanon was removed).  Periods also tend to be shorter. Last period also had what looked like "tissue".  Unsure if this could have been a miscarriage. Periods are still regular, occurring ~ every 28-35 days.  Gynecologic History Menarche age: 19 Patient's last menstrual period was 01/14/2017. Contraception: none  History of STI's: Denies Last Pap: 12/2013. Results were: normal.  Denies h/o abnormal pap smears.    Obstetric History   G6   P2   T2   P0   A4   L2    SAB0   TAB4   Ectopic0   Multiple0   Live Births2     # Outcome Date GA Lbr Len/2nd Weight Sex Delivery Anes PTL Lv  6 Term 06/2014 [redacted]w[redacted]d   M Vag-Spont   LIV     Complications: Mild pre-eclampsia  5 TAB           4 TAB           3 TAB           2 TAB           1 Term     M Vag-Spont   LIV      Past Medical History:  Diagnosis Date  . Diabetes mellitus without complication ([place])   . History of pre-eclampsia 2016   mild  . Hypertension     Past Surgical History:  Procedure Laterality Date  . DILATION AND CURETTAGE OF UTERUS     x 4    Family History  Problem Relation Age of Onset  . Hypertension Mother   . Cancer Mother        Uterine vs cervical (pt unsure),   . Schizophrenia Brother     Social History   Social History  . Marital status: Single    Spouse name: N/A  . Number of children: N/A  . Years of education: N/A   Occupational  History  . Not on file.   Social History Main Topics  . Smoking status: Never Smoker  . Smokeless tobacco: Never Used  . Alcohol use No  . Drug use: No  . Sexual activity: Yes    Birth control/ protection: None   Other Topics Concern  . Not on file   Social History Narrative  . No narrative on file    Current Outpatient Prescriptions on File Prior to Visit  Medication Sig Dispense Refill  . metoprolol tartrate (LOPRESSOR) 25 MG tablet Take 25 mg by mouth 2 (two) times daily.    . metroNIDAZOLE (METROGEL VAGINAL) 0.75 % vaginal gel Place 1 Applicatorful vaginally at bedtime. For 5 nights 70 g 0  . NOVOLOG FLEXPEN 100 UNIT/ML FlexPen TAKE 3-10 UNITS UP TO 4 TIMES DAILY AS DIRECTED  12   No current facility-administered medications on file prior to visit.     Allergies  Allergen Reactions  . Morphine     Other reaction(s):  Hallucination  . Sulfamethoxazole-Trimethoprim Hives  . Biaxin [Clarithromycin]   . Hydrocodone Itching  . Morphine And Related     Review of Systems Constitutional: negative for chills, fatigue, fevers and sweats Eyes: negative for irritation, redness and visual disturbance Ears, nose, mouth, throat, and face: negative for hearing loss, nasal congestion, snoring and tinnitus Respiratory: negative for asthma, cough, sputum Cardiovascular: negative for chest pain, dyspnea, exertional chest pressure/discomfort, irregular heart beat, palpitations and syncope Gastrointestinal: negative for abdominal pain, change in bowel habits, nausea and vomiting Genitourinary: positive for abnormal menstrual periods (see HPI). Negative for genital lesions, sexual problems and vaginal discharge, dysuria and urinary incontinence Integument/breast: negative for breast lump, breast tenderness and nipple discharge Hematologic/lymphatic: negative for bleeding and easy bruising Musculoskeletal:negative for back pain and muscle weakness Neurological: negative for dizziness,  headaches, vertigo and weakness Endocrine: negative for diabetic symptoms including polydipsia, polyuria and skin dryness Allergic/Immunologic: negative for hay fever and urticaria       Objective:  Blood pressure (!) 143/93, pulse 83, height 5\' 7"  (1.702 m), weight 198 lb (89.8 kg), last menstrual period 01/14/2017. Body mass index is 31.01 kg/m.  General Appearance:    Alert, cooperative, no distress, appears stated age, mild obesity  Head:    Normocephalic, without obvious abnormality, atraumatic  Eyes:    PERRL, conjunctiva/corneas clear, EOM's intact, both eyes  Ears:    Normal external ear canals, both ears  Nose:   Nares normal, septum midline, mucosa normal, no drainage or sinus tenderness  Throat:   Lips, mucosa, and tongue normal; teeth and gums normal  Neck:   Supple, symmetrical, trachea midline, no adenopathy; thyroid: no enlargement/tenderness/nodules; no carotid bruit or JVD  Back:     Symmetric, no curvature, ROM normal, no CVA tenderness  Lungs:     Clear to auscultation bilaterally, respirations unlabored  Chest Wall:    No tenderness or deformity   Heart:    Regular rate and rhythm, S1 and S2 normal, no murmur, rub or gallop  Breast Exam:    No tenderness, masses, or nipple abnormality  Abdomen:     Soft, non-tender, bowel sounds active all four quadrants, no masses, no organomegaly.    Genitalia:    Pelvic:external genitalia normal, vagina without lesions, discharge, or tenderness, rectovaginal septum  normal. Cervix normal in appearance, no cervical motion tenderness, no adnexal masses or tenderness.  Uterus normal size, shape, mobile, regular contours, nontender.  Rectal:    Normal external sphincter.  No hemorrhoids appreciated. Internal exam not done.   Extremities:   Extremities normal, atraumatic, no cyanosis or edema  Pulses:   2+ and symmetric all extremities  Skin:   Skin color, texture, turgor normal, no rashes or lesions  Lymph nodes:   Cervical,  supraclavicular, and axillary nodes normal  Neurologic:   CNII-XII intact, normal strength, sensation and reflexes throughout   .  Labs:  Lab Results  Component Value Date   WBC 5.0 01/29/2016   HGB 10.3 (L) 01/29/2016   HCT 31.1 (L) 01/29/2016   MCV 84 01/29/2016   PLT 376 01/29/2016    Lab Results  Component Value Date   CREATININE 0.95 01/29/2016   BUN 20 01/29/2016   NA 139 01/29/2016   K 4.1 01/29/2016   CL 102 01/29/2016   CO2 22 01/29/2016    Lab Results  Component Value Date   ALT 11 01/29/2016   AST 14 01/29/2016   ALKPHOS 112 01/29/2016   BILITOT <0.2 01/29/2016  Lab Results  Component Value Date   TSH 1.83 12/25/2013     Assessment:   Routine gynecologic exam.   Type 1 DM HTN Family planning DUB Family h/o cancer   Plan:    - Blood tests: CBC with diff, Comprehensive metabolic panel and Lipid Panel and TSH. - Breast self exam technique reviewed and patient encouraged to perform self-exam monthly. - Contraception: none. She and partner are considering conceiving. Notes that they have not been using protection for the past 6-8 months, is worried about possible problems with conception due to her changing periods, and also wonders if her partner should be tested (not the father of her other 2 children). Discussed workup usually indicated for infertility > 1 year, but patient insistent.  Will give instructions on semen analysis for partner to collect sample. Also advised patient on ovulation app on phone as she does not keep up with her cycles, and timed coitus.  - Discussed healthy lifestyle modifications. - Pap smear performed today. - Desires to postpone flu vaccine here. Will get it at work.  -  Endocrinologist for management of Type 1 DM (Dr. Jackson Latino in [place]). Last HgbA1c 8.5 several months ago. Discussed improving her health could have a positive impact on her fertility.  - Patient uncertain which type of cancer her mother has, however  is a reproductive cancer, currently having a recurrence requiring a mastectomy.  Discussed genetic cancer testing, encouraged patient to find out which type her mother has to see if her mother or patient is a candidate for testing.  - DUB over several months.  Patient desiring conception, declines hormonal management with OCPs. Will prescribe Lysteda.  Will also order HCG level to assess if most recent period could have possibly been a miscarriage due to description of bleeding (lasted only 1-2 days, heavy bleeding, passage of clots and possible "tissue").  HTN managed by PCP.  - Follow up in 3 months if no success with Lysteda, or in 1 year if all resolves for next annual exam.    Rubie Maid, MD [facility]

## 2017-01-26 LAB — IGP, COBASHPV16/18
HPV 16: NEGATIVE
HPV 18: NEGATIVE
HPV other hr types: NEGATIVE
PAP Smear Comment: 0

## 2017-04-13 ENCOUNTER — Inpatient Hospital Stay (HOSPITAL_COMMUNITY)
Admission: EM | Admit: 2017-04-13 | Discharge: 2017-04-16 | DRG: 638 | Disposition: A | Discharge status: Home / Self Care | Payer: Medicaid Other | Attending: Internal Medicine | Admitting: Internal Medicine

## 2017-04-13 ENCOUNTER — Other Ambulatory Visit: Payer: Self-pay

## 2017-04-13 ENCOUNTER — Encounter (HOSPITAL_COMMUNITY): Payer: Self-pay | Admitting: Emergency Medicine

## 2017-04-13 DIAGNOSIS — E138 Other specified diabetes mellitus with unspecified complications: Secondary | ICD-10-CM | POA: Diagnosis not present

## 2017-04-13 DIAGNOSIS — E119 Type 2 diabetes mellitus without complications: Secondary | ICD-10-CM

## 2017-04-13 DIAGNOSIS — E10649 Type 1 diabetes mellitus with hypoglycemia without coma: Secondary | ICD-10-CM | POA: Diagnosis not present

## 2017-04-13 DIAGNOSIS — E876 Hypokalemia: Secondary | ICD-10-CM | POA: Diagnosis present

## 2017-04-13 DIAGNOSIS — Z8481 Family history of carrier of genetic disease: Secondary | ICD-10-CM

## 2017-04-13 DIAGNOSIS — Z794 Long term (current) use of insulin: Secondary | ICD-10-CM

## 2017-04-13 DIAGNOSIS — E870 Hyperosmolality and hypernatremia: Secondary | ICD-10-CM | POA: Diagnosis present

## 2017-04-13 DIAGNOSIS — E103219 Type 1 diabetes mellitus with mild nonproliferative diabetic retinopathy with macular edema, unspecified eye: Secondary | ICD-10-CM | POA: Diagnosis present

## 2017-04-13 DIAGNOSIS — I10 Essential (primary) hypertension: Secondary | ICD-10-CM | POA: Diagnosis present

## 2017-04-13 DIAGNOSIS — Z23 Encounter for immunization: Secondary | ICD-10-CM

## 2017-04-13 DIAGNOSIS — E860 Dehydration: Secondary | ICD-10-CM | POA: Diagnosis present

## 2017-04-13 DIAGNOSIS — R739 Hyperglycemia, unspecified: Secondary | ICD-10-CM | POA: Diagnosis not present

## 2017-04-13 DIAGNOSIS — E103213 Type 1 diabetes mellitus with mild nonproliferative diabetic retinopathy with macular edema, bilateral: Secondary | ICD-10-CM | POA: Diagnosis present

## 2017-04-13 DIAGNOSIS — N179 Acute kidney failure, unspecified: Secondary | ICD-10-CM | POA: Diagnosis not present

## 2017-04-13 DIAGNOSIS — Z9114 Patient's other noncompliance with medication regimen: Secondary | ICD-10-CM

## 2017-04-13 DIAGNOSIS — Z882 Allergy status to sulfonamides status: Secondary | ICD-10-CM

## 2017-04-13 DIAGNOSIS — E1069 Type 1 diabetes mellitus with other specified complication: Principal | ICD-10-CM | POA: Diagnosis present

## 2017-04-13 DIAGNOSIS — E785 Hyperlipidemia, unspecified: Secondary | ICD-10-CM | POA: Diagnosis present

## 2017-04-13 DIAGNOSIS — Z885 Allergy status to narcotic agent status: Secondary | ICD-10-CM

## 2017-04-13 DIAGNOSIS — Z79899 Other long term (current) drug therapy: Secondary | ICD-10-CM

## 2017-04-13 LAB — CBC
HCT: 31.5 % — ABNORMAL LOW (ref 36.0–46.0)
Hemoglobin: 10.9 g/dL — ABNORMAL LOW (ref 12.0–15.0)
MCH: 27.7 pg (ref 26.0–34.0)
MCHC: 34.6 g/dL (ref 30.0–36.0)
MCV: 80.2 fL (ref 78.0–100.0)
Platelets: 328 10*3/uL (ref 150–400)
RBC: 3.93 MIL/uL (ref 3.87–5.11)
RDW: 14.2 % (ref 11.5–15.5)
WBC: 7.3 10*3/uL (ref 4.0–10.5)

## 2017-04-13 LAB — COMPREHENSIVE METABOLIC PANEL
ALT: 10 U/L — ABNORMAL LOW (ref 14–54)
AST: 12 U/L — ABNORMAL LOW (ref 15–41)
Albumin: 3.8 g/dL (ref 3.5–5.0)
Alkaline Phosphatase: 196 U/L — ABNORMAL HIGH (ref 38–126)
Anion gap: 14 (ref 5–15)
BUN: 26 mg/dL — ABNORMAL HIGH (ref 6–20)
CO2: 20 mmol/L — ABNORMAL LOW (ref 22–32)
Calcium: 9 mg/dL (ref 8.9–10.3)
Chloride: 88 mmol/L — ABNORMAL LOW (ref 101–111)
Creatinine, Ser: 2.81 mg/dL — ABNORMAL HIGH (ref 0.44–1.00)
GFR calc Af Amer: 24 mL/min — ABNORMAL LOW (ref >60)
GFR calc non Af Amer: 21 mL/min — ABNORMAL LOW (ref >60)
Glucose, Bld: 789 mg/dL (ref 65–99)
Potassium: 4.7 mmol/L (ref 3.5–5.1)
Sodium: 122 mmol/L — ABNORMAL LOW (ref 135–145)
Total Bilirubin: 1 mg/dL (ref 0.3–1.2)
Total Protein: 8.1 g/dL (ref 6.5–8.1)

## 2017-04-13 LAB — CBG MONITORING, ED: Glucose-Capillary: >600 mg/dL (ref 65–99)

## 2017-04-13 LAB — LIPASE, BLOOD: Lipase: 26 U/L (ref 11–51)

## 2017-04-13 LAB — I-STAT BETA HCG BLOOD, ED (MC, WL, AP ONLY): I-stat hCG, quantitative: <5 m[IU]/mL (ref <5)

## 2017-04-13 MED ORDER — DEXTROSE-NACL 5-0.45 % IV SOLN: INTRAVENOUS | Status: DC

## 2017-04-13 MED ORDER — SODIUM CHLORIDE 0.9 % IV BOLUS (SEPSIS)
1000.0000 mL | Freq: Once | INTRAVENOUS | Status: AC
  Administered 2017-04-13: 1000 mL via INTRAVENOUS

## 2017-04-13 MED ORDER — SODIUM CHLORIDE 0.9 % IV SOLN
INTRAVENOUS | Status: DC
  Administered 2017-04-13: 5 [IU]/h via INTRAVENOUS
  Filled 2017-04-13: qty 1

## 2017-04-13 NOTE — ED Notes (Signed)
Pt's CBG "HI:  Greater than 600."  Informed Cindy, RN.

## 2017-04-13 NOTE — ED Notes (Signed)
Glucose

## 2017-04-13 NOTE — H&P (Signed)
History and Physical    Kelly Huff WJX:[id number] DOB: 1986-01-30 DOA: 04/13/2017  PCP: Lin Landsman, MD  Patient coming from:  home  Chief Complaint:   Nausea, high sugar  HPI: Kelly Huff is a 31 y.o. female with medical history significant of type 1 DM since age 56 years old no previous hospitalizations for DKA in the past and reports she was changed from her lantus/novolog to trulicity only 2 weeks ago.  Her glucose has been high since.  She actually lost her glucometer and is in the process of getting another one but her insurance company is giving problems for paying for it and she might get one of Friday (today is Tuesday).  She denies any fevers.  She has been having a foul taste to her mouth.  No vomiting but naseated.  No urinary symtpoms.  Pt found to have glucose over 700 with normal gap and referred for admission for uncontrolled diabetes.  Review of Systems: As per HPI otherwise 10 point review of systems negative.   Past Medical History:  Diagnosis Date  . Diabetes mellitus without complication ([place])   . History of pre-eclampsia 2016   mild  . Hypertension     Past Surgical History:  Procedure Laterality Date  . DILATION AND CURETTAGE OF UTERUS     x 4     reports that  has never smoked. she has never used smokeless tobacco. She reports that she does not drink alcohol or use drugs.  Allergies  Allergen Reactions  . Morphine     Other reaction(s): Hallucination  . Sulfamethoxazole-Trimethoprim Hives    Family History  Problem Relation Age of Onset  . Hypertension Mother   . Cancer Mother        Uterine vs cervical (pt unsure),   . Schizophrenia Brother     Prior to Admission medications   Medication Sig Start Date End Date Taking? Authorizing Provider  cyclobenzaprine (FLEXERIL) 10 MG tablet Take 10 mg by mouth 3 (three) times daily as needed for muscle spasms.   Yes [provider]  LANTUS SOLOSTAR 100 UNIT/ML Solostar Pen Inject 60  Units into the skin at bedtime. 03/09/17  Yes [provider]  metoprolol tartrate (LOPRESSOR) 25 MG tablet Take 25 mg by mouth 2 (two) times daily.   Yes [provider]  NOVOLOG FLEXPEN 100 UNIT/ML FlexPen TAKE 3-10 UNITS UP TO 4 TIMES DAILY AS DIRECTED 12/24/15  Yes [provider]  ranitidine (ZANTAC) 150 MG capsule Take 150 mg by mouth every evening.   Yes [provider]  metroNIDAZOLE (METROGEL VAGINAL) 0.75 % vaginal gel Place 1 Applicatorful vaginally at bedtime. For 5 nights Patient not taking: Reported on 04/13/2017 01/05/17   Defrancesco, Alanda Slim, MD  tranexamic acid (LYSTEDA) 650 MG TABS tablet Take 2 tablets (1,300 mg total) by mouth 3 (three) times daily. Take during menses for a maximum of five days Patient not taking: Reported on 04/13/2017 01/21/17   Rubie Maid, MD    Physical Exam: Vitals:   04/13/17 1843 04/13/17 2219 04/13/17 2301  BP: (!) 151/103 (!) 151/112 (!) 172/97  Pulse: (!) 116 (!) 110 (!) 104  Resp: '16 13 14  ' Temp: 98 F (36.7 C)    TempSrc: Oral    SpO2: 99% 100% 100%      Constitutional: NAD, calm, comfortable Vitals:   04/13/17 1843 04/13/17 2219 04/13/17 2301  BP: (!) 151/103 (!) 151/112 (!) 172/97  Pulse: (!) 116 (!) 110 (!) 104  Resp: '16 13 14  ' Temp: 98 F (36.7 C)    TempSrc: Oral    SpO2: 99% 100% 100%   Eyes: PERRL, lids and conjunctivae normal ENMT: Mucous membranes are moist. Posterior pharynx clear of any exudate or lesions.Normal dentition.  Neck: normal, supple, no masses, no thyromegaly Respiratory: clear to auscultation bilaterally, no wheezing, no crackles. Normal respiratory effort. No accessory muscle use.  Cardiovascular: Regular rate and rhythm, no murmurs / rubs / gallops. No extremity edema. 2+ pedal pulses. No carotid bruits.  Abdomen: no tenderness, no masses palpated. No hepatosplenomegaly. Bowel sounds positive.  Musculoskeletal: no clubbing / cyanosis. No joint deformity upper and  lower extremities. Good ROM, no contractures. Normal muscle tone.  Skin: no rashes, lesions, ulcers. No induration Neurologic: CN 2-12 grossly intact. Sensation intact, DTR normal. Strength 5/5 in all 4.  Psychiatric: Normal judgment and insight. Alert and oriented x 3. Normal mood.    Labs on Admission: I have personally reviewed following labs and imaging studies  CBC: Recent Labs  Lab 04/13/17 1853  WBC 7.3  HGB 10.9*  HCT 31.5*  MCV 80.2  PLT 182   Basic Metabolic Panel: Recent Labs  Lab 04/13/17 1853  NA 122*  K 4.7  CL 88*  CO2 20*  GLUCOSE 789*  BUN 26*  CREATININE 2.81*  CALCIUM 9.0   GFR: CrCl cannot be calculated (Unknown ideal weight.). Liver Function Tests: Recent Labs  Lab 04/13/17 1853  AST 12*  ALT 10*  ALKPHOS 196*  BILITOT 1.0  PROT 8.1  ALBUMIN 3.8   Recent Labs  Lab 04/13/17 1853  LIPASE 26   No results for input(s): AMMONIA in the last 168 hours. Coagulation Profile: No results for input(s): INR, PROTIME in the last 168 hours. Cardiac Enzymes: No results for input(s): CKTOTAL, CKMB, CKMBINDEX, TROPONINI in the last 168 hours. BNP (last 3 results) No results for input(s): PROBNP in the last 8760 hours. HbA1C: No results for input(s): HGBA1C in the last 72 hours. CBG: Recent Labs  Lab 04/13/17 1849  GLUCAP >600*   Lipid Profile: No results for input(s): CHOL, HDL, LDLCALC, TRIG, CHOLHDL, LDLDIRECT in the last 72 hours. Thyroid Function Tests: No results for input(s): TSH, T4TOTAL, FREET4, T3FREE, THYROIDAB in the last 72 hours. Anemia Panel: No results for input(s): VITAMINB12, FOLATE, FERRITIN, TIBC, IRON, RETICCTPCT in the last 72 hours. Urine analysis: No results found for: COLORURINE, APPEARANCEUR, LABSPEC, PHURINE, GLUCOSEU, HGBUR, BILIRUBINUR, KETONESUR, PROTEINUR, UROBILINOGEN, NITRITE, LEUKOCYTESUR Sepsis Labs: !!!!!!!!!!!!!!!!!!!!!!!!!!!!!!!!!!!!!!!!!!!! '@LABRCNTIP' (procalcitonin:4,lacticidven:4) )No results found for  this or any previous visit (from the past 240 hour(s)).   Radiological Exams on Admission: No results found.  Old chart reviewed Case discussed with edp  Assessment/Plan 31 yo female with recent changes in her diabetic medications comes in with glucose over 700  Principal Problem:   Diabetes ([place])- just switched to trulicity 2 weeks ago and off her insulin.  She was previously on lantus 60 units qhs and novolog with meals.  Will place on insulin drip at 5 units/hour.  ivf at ns 125cc/hour.  Ck hga1c. Not acidotic, with normal gap and normal co2 but with some kidney injury.  Hourly glucose cks.  Perhaps we can also aid in getting her a glucometer before she goes home.  obs on stepdown while on drip.  Can likely transition to ssi in the next couple of hours.  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  She is interested in getting a pump and needs to be set up with an endocrinologist.     DVT prophylaxis:  scds Code Status:  full Family Communication:  husband Disposition Plan:  Per day team Consults called:  none Admission status:  obs   Cassiopeia Florentino A MD [facility]  If 7PM-7AM, please contact night-coverage [email or web address] Password [facility]  04/13/2017, 11:33 PM

## 2017-04-13 NOTE — ED Notes (Signed)
Pt reports taking 10 units of novolog in waiting room

## 2017-04-13 NOTE — ED Provider Notes (Signed)
[facility] EMERGENCY DEPARTMENT Provider Note   CSN: [id number] Arrival date & time: 04/13/17  1755     History   Chief Complaint Chief Complaint  Patient presents with  . Hyperglycemia  . Emesis    HPI Kelly Huff is a 31 y.o. female.  Kelly Huff is a 31 y.o. Female with a history of type one diabetes who presents to the ED leaning of dry mouth and feeling weak.  She is noted to have hyperglycemia in triage.  Patient reports that about 2 weeks ago she was started on a new insulin called Trulicity by her PCP Dr. Jackson Latino.  She reports this is a once weekly medication for diabetes.  She reports for the past 2 weeks since starting this medication she has not been feeling well.  She reports when she eats she feels nauseated and throws up.  She denies having any abdominal pain.  She tells me she has been keeping down liquids, but is unable to eat much of anything.  She reports she no longer has a glucometer and has not been checking her blood sugars.  She reports having a dry mouth.  She also reports she has forgotten to take her metoprolol for the past week.  She denies previous abdominal surgeries.  She denies fevers, hematemesis, diarrhea, urinary symptoms, rashes, chest pain, coughing, shortness of breath, syncope. She has been on novolog and lantus before.    The history is provided by the patient, medical records and the spouse. No language interpreter was used.  Hyperglycemia  Associated symptoms: fatigue, nausea and vomiting   Associated symptoms: no abdominal pain, no chest pain, no dysuria, no fever and no shortness of breath   Emesis   Pertinent negatives include no abdominal pain, no chills, no cough, no diarrhea, no fever and no headaches.    Past Medical History:  Diagnosis Date  . Diabetes mellitus without complication ([place])   . History of pre-eclampsia 2016   mild  . Hypertension     Patient Active Problem List   Diagnosis Date  Noted  . Obesity (BMI 30.0-34.9) 01/21/2017  . Other nonspecific abnormal finding 01/29/2016  . Family history of BRCA gene positive 07/13/2013  . 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 DILATION AND CURETTAGE OF UTERUS     x 4    OB History    Gravida Para Term Preterm AB Living   '6 2 2   4 2   ' SAB TAB Ectopic Multiple Live Births     4     2       Home Medications    Prior to Admission medications   Medication Sig Start Date End Date Taking? Authorizing Provider  cyclobenzaprine (FLEXERIL) 10 MG tablet Take 10 mg by mouth 3 (three) times daily as needed for muscle spasms.   Yes [provider]  LANTUS SOLOSTAR 100 UNIT/ML Solostar Pen Inject 60 Units into the skin at bedtime. 03/09/17  Yes [provider]  metoprolol tartrate (LOPRESSOR) 25 MG tablet Take 25 mg by mouth 2 (two) times daily.   Yes [provider]  NOVOLOG FLEXPEN 100 UNIT/ML FlexPen TAKE 3-10 UNITS UP TO 4 TIMES DAILY AS DIRECTED 12/24/15  Yes [provider]  ranitidine (ZANTAC) 150 MG capsule Take 150 mg by mouth every evening.   Yes [provider]  metroNIDAZOLE (METROGEL VAGINAL) 0.75 %  vaginal gel Place 1 Applicatorful vaginally at bedtime. For 5 nights Patient not taking: Reported on 04/13/2017 01/05/17   Defrancesco, Alanda Slim, MD  tranexamic acid (LYSTEDA) 650 MG TABS tablet Take 2 tablets (1,300 mg total) by mouth 3 (three) times daily. Take during menses for a maximum of five days Patient not taking: Reported on 04/13/2017 01/21/17   Rubie Maid, MD    Family History Family History  Problem Relation Age of Onset  . Hypertension Mother   . Cancer Mother        Uterine vs cervical (pt unsure),   . Schizophrenia Brother     Social History Social History   Tobacco Use  . Smoking status: Never Smoker  . Smokeless tobacco: Never Used  Substance Use  Topics  . Alcohol use: No  . Drug use: No     Allergies   Morphine and Sulfamethoxazole-trimethoprim   Review of Systems Review of Systems  Constitutional: Positive for fatigue. Negative for chills and fever.  HENT: Negative for congestion and sore throat.   Eyes: Negative for visual disturbance.  Respiratory: Negative for cough and shortness of breath.   Cardiovascular: Negative for chest pain.  Gastrointestinal: Positive for nausea and vomiting. Negative for abdominal pain, blood in stool and diarrhea.  Genitourinary: Negative for difficulty urinating, dysuria, flank pain and frequency.  Musculoskeletal: Negative for back pain and neck pain.  Skin: Negative for rash.  Neurological: Negative for headaches.     Physical Exam Updated Vital Signs BP (!) 151/112   Pulse (!) 110   Temp 98 F (36.7 C) (Oral)   Resp 13   SpO2 100%   Physical Exam  Constitutional: She is oriented to person, place, and time. She appears well-developed and well-nourished. No distress.  HENT:  Head: Normocephalic and atraumatic.  Mouth/Throat: Oropharynx is clear and moist.  Eyes: Conjunctivae are normal. Pupils are equal, round, and reactive to light. Right eye exhibits no discharge. Left eye exhibits no discharge.  Neck: Neck supple.  Cardiovascular: Regular rhythm, normal heart sounds and intact distal pulses. Exam reveals no gallop and no friction rub.  No murmur heard. HR 112.   Pulmonary/Chest: Effort normal and breath sounds normal. No respiratory distress. She has no wheezes. She has no rales.  Abdominal: Soft. Bowel sounds are normal. She exhibits no mass. There is no tenderness. There is no rebound and no guarding.  Abdomen is soft nontender to palpation.  Musculoskeletal: She exhibits no edema or tenderness.  Lymphadenopathy:    She has no cervical adenopathy.  Neurological: She is alert and oriented to person, place, and time. Coordination normal.  Skin: Skin is warm and dry. No  rash noted. She is not diaphoretic. No erythema. No pallor.  Psychiatric: She has a normal mood and affect. Her behavior is normal.  Nursing note and vitals reviewed.    ED Treatments / Results  Labs (all labs ordered are listed, but only abnormal results are displayed) Labs Reviewed  COMPREHENSIVE METABOLIC PANEL - Abnormal; Notable for the following components:      Result Value   Sodium 122 (*)    Chloride 88 (*)    CO2 20 (*)    Glucose, Bld 789 (*)    BUN 26 (*)    Creatinine, Ser 2.81 (*)    AST 12 (*)    ALT 10 (*)    Alkaline Phosphatase 196 (*)    GFR calc non Af Amer 21 (*)    GFR calc Af Wyvonnia Lora  24 (*)    All other components within normal limits  CBC - Abnormal; Notable for the following components:   Hemoglobin 10.9 (*)    HCT 31.5 (*)    All other components within normal limits  CBG MONITORING, ED - Abnormal; Notable for the following components:   Glucose-Capillary >600 (*)    All other components within normal limits  LIPASE, BLOOD  URINALYSIS, ROUTINE W REFLEX MICROSCOPIC  I-STAT BETA HCG BLOOD, ED ([facility], [facility], [facility] ONLY)  CBG MONITORING, ED    EKG  EKG Interpretation None       Radiology No results found.  Procedures Procedures (including critical care time)  CRITICAL CARE Performed by: Hanley Hays   Total critical care time: 35  minutes  Critical care time was exclusive of separately billable procedures and treating other patients.  Critical care was necessary to treat or prevent imminent or life-threatening deterioration.  Critical care was time spent personally by me on the following activities: development of treatment plan with patient and/or surrogate as well as nursing, discussions with consultants, evaluation of patient's response to treatment, examination of patient, obtaining history from patient or surrogate, ordering and performing treatments and interventions, ordering and review of laboratory studies, ordering and review of  radiographic studies, pulse oximetry and re-evaluation of patient's condition.   Medications Ordered in ED Medications  dextrose 5 %-0.45 % sodium chloride infusion (not administered)  insulin regular (NOVOLIN R,HUMULIN R) 100 Units in sodium chloride 0.9 % 100 mL (1 Units/mL) infusion (not administered)  sodium chloride 0.9 % bolus 1,000 mL (1,000 mLs Intravenous New Bag/Given 04/13/17 2231)     Initial Impression / Assessment and Plan / ED Course  I have reviewed the triage vital signs and the nursing notes.  Pertinent labs & imaging results that were available during my care of the patient were reviewed by me and considered in my medical decision making (see chart for details).     This  is a 31 y.o. Female with a history of type one diabetes who presents to the ED leaning of dry mouth and feeling weak.  She is noted to have hyperglycemia in triage.  Patient reports that about 2 weeks ago she was started on a new insulin called Trulicity by her PCP Dr. Jackson Latino.  She reports this is a once weekly medication for diabetes.  She reports for the past 2 weeks since starting this medication she has not been feeling well.  She reports when she eats she feels nauseated and throws up.  She denies having any abdominal pain.  She tells me she has been keeping down liquids, but is unable to eat much of anything.  She reports she no longer has a glucometer and has not been checking her blood sugars.  She reports having a dry mouth.  She also reports she has forgotten to take her metoprolol for the past week.  She denies previous abdominal surgeries. She has been on novolog and lantus before.  On exam the patient is afebrile nontoxic-appearing.  She is tachycardic with a heart rate of 112.  Mucous membranes appear slightly dry.  Abdomen is soft and nontender to palpation.  CMP is remarkable for a glucose of 789 with a normal anion gap and a creatinine of 2.81.  CBC and lipase are unremarkable. Patient with  acute renal failure with creatinine of 2.81 and a baseline of 0.95.  Patient also with significant hyperglycemia with a sugar of 789.  We will start  the patient on an insulin drip and provide with fluids.  She will need admission for continued hydration as well as recheck of labs and management of her hyperglycemia.  Patient agrees with plan for admission.  I consulted with [facility] hospitalist Dr. Shanon Brow who accepted the patient for admission.   This patient was discussed with Dr. Oleta Mouse who agrees with assessment and plan.    Final Clinical Impressions(s) / ED Diagnoses   Final diagnoses:  Hyperglycemia  Dehydration  Acute renal failure, unspecified acute renal failure type [facility])    ED Discharge Orders    None       Waynetta Pean, PA-C 04/13/17 2313    Forde Dandy, MD 04/14/17 1323

## 2017-04-13 NOTE — ED Notes (Signed)
Dr. Shanon Brow is at the patients bedside. Per a verbal order, no gluco stabilizer at this time. Please run the insulin at 5 units an hour for 3 hours with hourly checks and notify MD after 3 hours of current glucose level. NO D5 1/2NS when patient drops below 250.
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
## 2017-04-14 ENCOUNTER — Other Ambulatory Visit: Payer: Self-pay

## 2017-04-14 ENCOUNTER — Encounter (HOSPITAL_COMMUNITY): Payer: Self-pay | Admitting: General Practice

## 2017-04-14 DIAGNOSIS — E1069 Type 1 diabetes mellitus with other specified complication: Secondary | ICD-10-CM | POA: Diagnosis present

## 2017-04-14 DIAGNOSIS — Z79899 Other long term (current) drug therapy: Secondary | ICD-10-CM | POA: Diagnosis not present

## 2017-04-14 DIAGNOSIS — E108 Type 1 diabetes mellitus with unspecified complications: Secondary | ICD-10-CM

## 2017-04-14 DIAGNOSIS — Z882 Allergy status to sulfonamides status: Secondary | ICD-10-CM | POA: Diagnosis not present

## 2017-04-14 DIAGNOSIS — E1065 Type 1 diabetes mellitus with hyperglycemia: Secondary | ICD-10-CM | POA: Diagnosis not present

## 2017-04-14 DIAGNOSIS — E1101 Type 2 diabetes mellitus with hyperosmolarity with coma: Secondary | ICD-10-CM | POA: Diagnosis not present

## 2017-04-14 DIAGNOSIS — E10649 Type 1 diabetes mellitus with hypoglycemia without coma: Secondary | ICD-10-CM | POA: Diagnosis not present

## 2017-04-14 DIAGNOSIS — E876 Hypokalemia: Secondary | ICD-10-CM

## 2017-04-14 DIAGNOSIS — Z9114 Patient's other noncompliance with medication regimen: Secondary | ICD-10-CM | POA: Diagnosis not present

## 2017-04-14 DIAGNOSIS — Z23 Encounter for immunization: Secondary | ICD-10-CM | POA: Diagnosis not present

## 2017-04-14 DIAGNOSIS — I10 Essential (primary) hypertension: Secondary | ICD-10-CM | POA: Diagnosis present

## 2017-04-14 DIAGNOSIS — E103213 Type 1 diabetes mellitus with mild nonproliferative diabetic retinopathy with macular edema, bilateral: Secondary | ICD-10-CM | POA: Diagnosis present

## 2017-04-14 DIAGNOSIS — E785 Hyperlipidemia, unspecified: Secondary | ICD-10-CM | POA: Diagnosis present

## 2017-04-14 DIAGNOSIS — E860 Dehydration: Secondary | ICD-10-CM | POA: Diagnosis present

## 2017-04-14 DIAGNOSIS — E870 Hyperosmolality and hypernatremia: Secondary | ICD-10-CM | POA: Diagnosis present

## 2017-04-14 DIAGNOSIS — N179 Acute kidney failure, unspecified: Secondary | ICD-10-CM | POA: Diagnosis present

## 2017-04-14 DIAGNOSIS — Z885 Allergy status to narcotic agent status: Secondary | ICD-10-CM | POA: Diagnosis not present

## 2017-04-14 DIAGNOSIS — E7849 Other hyperlipidemia: Secondary | ICD-10-CM | POA: Diagnosis not present

## 2017-04-14 LAB — BASIC METABOLIC PANEL
Anion gap: 7 (ref 5–15)
Anion gap: 7 (ref 5–15)
BUN: 21 mg/dL — ABNORMAL HIGH (ref 6–20)
BUN: 19 mg/dL (ref 6–20)
CO2: 24 mmol/L (ref 22–32)
CO2: 20 mmol/L — ABNORMAL LOW (ref 22–32)
Calcium: 8.9 mg/dL (ref 8.9–10.3)
Calcium: 8.4 mg/dL — ABNORMAL LOW (ref 8.9–10.3)
Chloride: 103 mmol/L (ref 101–111)
Chloride: 100 mmol/L — ABNORMAL LOW (ref 101–111)
Creatinine, Ser: 2.26 mg/dL — ABNORMAL HIGH (ref 0.44–1.00)
Creatinine, Ser: 1.98 mg/dL — ABNORMAL HIGH (ref 0.44–1.00)
GFR calc Af Amer: 32 mL/min — ABNORMAL LOW (ref >60)
GFR calc Af Amer: 37 mL/min — ABNORMAL LOW (ref >60)
GFR calc non Af Amer: 27 mL/min — ABNORMAL LOW (ref >60)
GFR calc non Af Amer: 32 mL/min — ABNORMAL LOW (ref >60)
Glucose, Bld: 76 mg/dL (ref 65–99)
Glucose, Bld: 255 mg/dL — ABNORMAL HIGH (ref 65–99)
Potassium: 3.2 mmol/L — ABNORMAL LOW (ref 3.5–5.1)
Potassium: 3.4 mmol/L — ABNORMAL LOW (ref 3.5–5.1)
Sodium: 134 mmol/L — ABNORMAL LOW (ref 135–145)
Sodium: 127 mmol/L — ABNORMAL LOW (ref 135–145)

## 2017-04-14 LAB — LIPID PANEL
Cholesterol: 175 mg/dL (ref 0–200)
HDL: 32 mg/dL — ABNORMAL LOW (ref >40)
LDL Cholesterol: 100 mg/dL — ABNORMAL HIGH (ref 0–99)
Total CHOL/HDL Ratio: 5.5 RATIO
Triglycerides: 214 mg/dL — ABNORMAL HIGH (ref <150)
VLDL: 43 mg/dL — ABNORMAL HIGH (ref 0–40)

## 2017-04-14 LAB — CBC
HCT: 28.5 % — ABNORMAL LOW (ref 36.0–46.0)
Hemoglobin: 10.1 g/dL — ABNORMAL LOW (ref 12.0–15.0)
MCH: 27.3 pg (ref 26.0–34.0)
MCHC: 35.4 g/dL (ref 30.0–36.0)
MCV: 77 fL — ABNORMAL LOW (ref 78.0–100.0)
Platelets: 295 10*3/uL (ref 150–400)
RBC: 3.7 MIL/uL — ABNORMAL LOW (ref 3.87–5.11)
RDW: 13.8 % (ref 11.5–15.5)
WBC: 7 10*3/uL (ref 4.0–10.5)

## 2017-04-14 LAB — URINALYSIS, ROUTINE W REFLEX MICROSCOPIC
Bilirubin Urine: NEGATIVE
Glucose, UA: >=500 mg/dL — AB
Ketones, ur: 5 mg/dL — AB
Nitrite: NEGATIVE
Protein, ur: >=300 mg/dL — AB
Specific Gravity, Urine: 1.012 (ref 1.005–1.030)
pH: 5 (ref 5.0–8.0)

## 2017-04-14 LAB — CBG MONITORING, ED
Glucose-Capillary: 106 mg/dL — ABNORMAL HIGH (ref 65–99)
Glucose-Capillary: 125 mg/dL — ABNORMAL HIGH (ref 65–99)
Glucose-Capillary: 130 mg/dL — ABNORMAL HIGH (ref 65–99)
Glucose-Capillary: 195 mg/dL — ABNORMAL HIGH (ref 65–99)
Glucose-Capillary: 230 mg/dL — ABNORMAL HIGH (ref 65–99)
Glucose-Capillary: 244 mg/dL — ABNORMAL HIGH (ref 65–99)
Glucose-Capillary: 267 mg/dL — ABNORMAL HIGH (ref 65–99)
Glucose-Capillary: 419 mg/dL — ABNORMAL HIGH (ref 65–99)
Glucose-Capillary: 78 mg/dL (ref 65–99)

## 2017-04-14 LAB — GLUCOSE, CAPILLARY
Glucose-Capillary: 279 mg/dL — ABNORMAL HIGH (ref 65–99)
Glucose-Capillary: 213 mg/dL — ABNORMAL HIGH (ref 65–99)

## 2017-04-14 LAB — HEMOGLOBIN A1C
Hgb A1c MFr Bld: 9.8 % — ABNORMAL HIGH (ref 4.8–5.6)
Mean Plasma Glucose: 234.56 mg/dL

## 2017-04-14 LAB — MAGNESIUM: Magnesium: 1.7 mg/dL (ref 1.7–2.4)

## 2017-04-14 MED ORDER — ENSURE ENLIVE PO LIQD
237.0000 mL | Freq: Two times a day (BID) | ORAL | Status: DC
  Administered 2017-04-16: 237 mL via ORAL

## 2017-04-14 MED ORDER — METOPROLOL TARTRATE 50 MG PO TABS
50.0000 mg | ORAL_TABLET | Freq: Two times a day (BID) | ORAL | Status: DC
  Administered 2017-04-14 – 2017-04-15 (×2): 50 mg via ORAL
  Filled 2017-04-14 (×2): qty 1

## 2017-04-14 MED ORDER — INSULIN ASPART 100 UNIT/ML ~~LOC~~ SOLN: 3.0000 [IU] | Freq: Three times a day (TID) | SUBCUTANEOUS | Status: DC

## 2017-04-14 MED ORDER — POTASSIUM CHLORIDE CRYS ER 20 MEQ PO TBCR: 40.0000 meq | EXTENDED_RELEASE_TABLET | Freq: Once | ORAL | Status: DC

## 2017-04-14 MED ORDER — METOPROLOL TARTRATE 25 MG PO TABS
25.0000 mg | ORAL_TABLET | Freq: Two times a day (BID) | ORAL | Status: DC
  Administered 2017-04-14: 25 mg via ORAL
  Filled 2017-04-14: qty 1

## 2017-04-14 MED ORDER — INSULIN ASPART 100 UNIT/ML ~~LOC~~ SOLN: 0.0000 [IU] | SUBCUTANEOUS | Status: DC

## 2017-04-14 MED ORDER — ENOXAPARIN SODIUM 40 MG/0.4ML ~~LOC~~ SOLN
40.0000 mg | SUBCUTANEOUS | Status: DC
  Filled 2017-04-14 (×3): qty 0.4

## 2017-04-14 MED ORDER — CYCLOBENZAPRINE HCL 10 MG PO TABS: 10.0000 mg | ORAL_TABLET | Freq: Three times a day (TID) | ORAL | Status: DC | PRN

## 2017-04-14 MED ORDER — INSULIN GLARGINE 100 UNIT/ML ~~LOC~~ SOLN
30.0000 [IU] | Freq: Every day | SUBCUTANEOUS | Status: DC
  Administered 2017-04-14: 30 [IU] via SUBCUTANEOUS
  Filled 2017-04-14 (×2): qty 0.3

## 2017-04-14 MED ORDER — SODIUM CHLORIDE 0.9 % IV SOLN
INTRAVENOUS | Status: DC
  Administered 2017-04-14 – 2017-04-15 (×4): via INTRAVENOUS

## 2017-04-14 MED ORDER — INSULIN ASPART 100 UNIT/ML ~~LOC~~ SOLN: 0.0000 [IU] | Freq: Three times a day (TID) | SUBCUTANEOUS | Status: DC

## 2017-04-14 MED ORDER — INSULIN ASPART 100 UNIT/ML ~~LOC~~ SOLN
0.0000 [IU] | SUBCUTANEOUS | Status: DC
  Administered 2017-04-14: 3 [IU] via SUBCUTANEOUS
  Administered 2017-04-14: 5 [IU] via SUBCUTANEOUS
  Administered 2017-04-15: 3 [IU] via SUBCUTANEOUS
  Administered 2017-04-15: 1 [IU] via SUBCUTANEOUS
  Administered 2017-04-15 (×2): 2 [IU] via SUBCUTANEOUS
  Administered 2017-04-16: 1 [IU] via SUBCUTANEOUS

## 2017-04-14 MED ORDER — INFLUENZA VAC SPLIT QUAD 0.5 ML IM SUSY
0.5000 mL | PREFILLED_SYRINGE | INTRAMUSCULAR | Status: AC
  Administered 2017-04-15: 0.5 mL via INTRAMUSCULAR
  Filled 2017-04-14: qty 0.5

## 2017-04-14 MED ORDER — ONDANSETRON HCL 4 MG PO TABS: 4.0000 mg | ORAL_TABLET | Freq: Four times a day (QID) | ORAL | Status: DC | PRN

## 2017-04-14 MED ORDER — INSULIN GLARGINE 100 UNIT/ML ~~LOC~~ SOLN: 30.0000 [IU] | Freq: Every day | SUBCUTANEOUS | Status: DC

## 2017-04-14 MED ORDER — HYDRALAZINE HCL 25 MG PO TABS: 25.0000 mg | ORAL_TABLET | Freq: Three times a day (TID) | ORAL | Status: DC

## 2017-04-14 MED ORDER — ONDANSETRON HCL 4 MG/2ML IJ SOLN
4.0000 mg | Freq: Four times a day (QID) | INTRAMUSCULAR | Status: DC | PRN
Start: 2017-04-14 — End: 2017-04-16

## 2017-04-14 MED ORDER — METOPROLOL TARTRATE 5 MG/5ML IV SOLN
5.0000 mg | Freq: Once | INTRAVENOUS | Status: AC
  Administered 2017-04-14: 5 mg via INTRAVENOUS
  Filled 2017-04-14: qty 5

## 2017-04-14 MED ORDER — INSULIN GLARGINE 100 UNIT/ML ~~LOC~~ SOLN
45.0000 [IU] | Freq: Every day | SUBCUTANEOUS | Status: DC
  Administered 2017-04-14: 45 [IU] via SUBCUTANEOUS
  Filled 2017-04-14: qty 0.45

## 2017-04-14 NOTE — Progress Notes (Signed)
Inpatient Diabetes Program Recommendations  AACE/ADA: New Consensus Statement on Inpatient Glycemic Control (2015)  Target Ranges:  Prepandial:   less than 140 mg/dL      Peak postprandial:   less than 180 mg/dL (1-2 hours)      Critically ill patients:  140 - 180 mg/dL   Lab Results  Component Value Date   GLUCAP 195 (H) 04/14/2017   HGBA1C 9.8 (H) 04/14/2017    Review of Glycemic Control Results for GARNETT, REKOWSKI (MRN [id number]) as of 04/14/2017 11:59  Ref. Range 04/14/2017 02:13 04/14/2017 03:01 04/14/2017 03:58 04/14/2017 07:17 04/14/2017 09:30  Glucose-Capillary Latest Ref Range: 65 - 99 mg/dL 130 (H) 78 125 (H) 106 (H) 195 (H)   Diabetes history: DM1 Outpatient Diabetes medications: Lantus 60 units qd + Novolog 3-10 units tid meal coverage Current orders for Inpatient glycemic control: Lantus 30 units qd + Novolog correction moderate tid  Inpatient Diabetes Program Recommendations:   Spoke with patient @ length by phone. Patient no longer sees Dr. Mickie Kay, endocrinologist due to being dismissed for missing too many appointments. States she was placed on Trulicity by Dr. Jackson Latino. Patient discontinued her insulin after starting Trulicity due to thinking Trulicity was also an insulin and her CBGs were running lower than usual. Explained to patient that Trulicity was not an insulin and she as a type 1 has to be on insulin. Patient verbalized understanding and agrees to get her glucometer prescription filled and check CBGs more frequently. Reviewed 15:15 rule with patient and patient verbalized understanding. Patient expressed she would like a referral to an endocrinologist. Also reviewed with patient to call her physician if her CBGs are running hypoglycemic and discuss change in insulin dosing rather than stopping insulin. Patient acknowledges understanding. -Referral to endocrinologist -Novolog 3 units tid meal coverage if eats 50% -Decrease Novolog correction to sensitive tid  + hs 0-5  Thank you, [name]. Aissa Lisowski, RN, MSN, CDE  Diabetes Coordinator Inpatient Glycemic Control Team Team Pager [phone] (8am-5pm) 04/14/2017 12:24 PM

## 2017-04-14 NOTE — ED Notes (Signed)
Spoke with dr. Tamala Julian, notified him that pt glucose is now 130. New order to d/c insulin and he will place order for sliding scale insulin and down grade pt to tele bed. Give patient lantus dose as scheduled.

## 2017-04-14 NOTE — ED Notes (Signed)
Spoke with kirby (hosptialist provider) pt glucose is 75, per her orders, continue insulin gtt at 1unit/hr for 1 hour after admin of lantus. Currently awaiting lantus dose from pharmacy.

## 2017-04-14 NOTE — Progress Notes (Signed)
ANTICOAGULATION CONSULT NOTE - Initial Consult  Pharmacy Consult for Lovenox Indication: VTE prophylaxis  Allergies  Allergen Reactions  . Morphine     Other reaction(s): Hallucination  . Sulfamethoxazole-Trimethoprim Hives    Patient Measurements:    Vital Signs: BP: 136/92 (12/19 1500) Pulse Rate: 95 (12/19 1500)  Labs: Recent Labs    04/13/17 1853 04/14/17 0250 04/14/17 1152  HGB 10.9* 10.1*  --   HCT 31.5* 28.5*  --   PLT 328 295  --   CREATININE 2.81* 2.26* 1.98*    CrCl cannot be calculated (Unknown ideal weight.).   Medical History: Past Medical History:  Diagnosis Date  . Diabetes mellitus without complication ([place])   . History of pre-eclampsia 2016   mild  . Hypertension    Will start Lovenox 40 mg SQ daily. nCrCl ~ 45 mL/min. Pharmacy to sign off.   Albertina Parr, PharmD., BCPS Clinical Pharmacist Pager [phone]

## 2017-04-14 NOTE — ED Notes (Signed)
Heart healthy carb modified lunch tray ordered 1031

## 2017-04-14 NOTE — Progress Notes (Signed)
PROGRESS NOTE    Kelly Huff  BWG:[id number] DOB: 1986-01-09 DOA: 04/13/2017 PCP: Lin Landsman, MD   Brief Narrative:   31 y.o. BF PMHx HTN, Preeclampsia, DM type 1 since age 13 years old no previous hospitalizations for DKA in the past and reports she was changed from her lantus/novolog to trulicity only 2 weeks ago.  Her glucose has been high since.  She actually lost her glucometer and is in the process of getting another one but her insurance company is giving problems for paying for it and she might get one of Friday (today is Tuesday).  She denies any fevers.  She has been having a foul taste to her mouth.  No vomiting but naseated.  No urinary symtpoms.  Pt found to have glucose over 700 with normal gap and referred for admission for uncontrolled diabetes.     Subjective: 12/19 A/O 4, negative CP, negative SOB, negative abdominal pain, negative N/V. States has a glucometer waiting at the pharmacy for but needs to pick it up. Misplaced her meter ~ 1 month ago. Was checking her sugars school [employer]). 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 Although AG closed electrolytes improved patient still has acute renal failure. -Patient without endocrine follow-up: See noncompliance with medication. -Increase Lantus to 45 units daily -sensitive SSI (would correspond with patient's NovoLog 3-10 units QAC; Patient does not know parameters) CBG Q 4hr -Lipid panel pending -schedule establish care appointment with Dr.Jeffrey Buddy Duty  Endocrinologist in 1-2 weeks diabetes type 1 uncontrolled with complication  Noncompliance with medication -Patient dismissed from Dr. Mickie Kay, practice endocrinologist due to missing too many appointments(per patient secondary to childbirth and starting school). -diabetic coordinator has spoken with patient at length. Stated placed on tremor this study by Dr. Jackson Latino which is why she discontinued using her insulin  -nutrition consult placed; Uncontrolled diabetic needs training in diabetic diet  Essential hypertension -increase Metoprolol 50 mg BID -Hold ACEI/ARB secondary to acute renal failure. If necessary add Hydralazine -  Acute renal failure -Beginning to resolve with resolution of HHNC -Trend creatinine -Avoid nephrotoxic medication  Hypokalemia -K-Dur 40 mEq     DVT prophylaxis: lovenox Code Status: full Family Communication: family at bedside for discussion of plan care Disposition Plan: expect discharge on 12/20   Consultants:  Diabetic coordinator Diabetic nutritionist   Procedures/Significant Events:  none   I have personally reviewed and interpreted all radiology studies and my findings are as above.  VENTILATOR SETTINGS: none   Cultures one  Antimicrobials:    Devices    LINES / TUBES:      Continuous Infusions: . sodium chloride 125 mL/hr at 04/14/17 0315     Objective: Vitals:   04/14/17 0530 04/14/17 0600 04/14/17 0630 04/14/17 0700  BP:    (!) 154/94  Pulse: 99 99 (!) 105 97  Resp: '16 18 18 12  ' Temp:      TempSrc:      SpO2: 100% 100% 100% 100%    Intake/Output Summary (Last 24 hours) at 04/14/2017 0829 Last data filed at 04/13/2017 2314  Gross per 24 hour  Intake 1000 ml  Output -  Net 1000 ml   There were no vitals filed for this visit.  Examination:  General: A/O 4,No acute respiratory distress Neck:  Negative scars, masses, torticollis, lymphadenopathy, JVD Lungs: Clear to auscultation bilaterally without  wheezes or crackles Cardiovascular: Regular rate and rhythm without murmur gallop or rub normal S1 and S2 Abdomen: negative abdominal pain, nondistended, positive soft, bowel sounds, no rebound, no ascites, no appreciable mass Extremities: No significant cyanosis, clubbing, or edema bilateral lower extremities Skin: Negative rashes, lesions, ulcers Psychiatric:  Negative depression, negative anxiety, negative fatigue, negative mania  Central nervous system:  Cranial nerves II through XII intact, tongue/uvula midline, all extremities muscle strength 5/5, sensation intact throughout,negative dysarthria, negative expressive aphasia, negative receptive aphasia.  .     Data Reviewed: Care during the described time interval was provided by me .  I have reviewed this patient's available data, including medical history, events of note, physical examination, and all test results as part of my evaluation.   CBC: Recent Labs  Lab 04/13/17 1853 04/14/17 0250  WBC 7.3 7.0  HGB 10.9* 10.1*  HCT 31.5* 28.5*  MCV 80.2 77.0*  PLT 328 194   Basic Metabolic Panel: Recent Labs  Lab 04/13/17 1853 04/14/17 0250  NA 122* 134*  K 4.7 3.2*  CL 88* 103  CO2 20* 24  GLUCOSE 789* 76  BUN 26* 21*  CREATININE 2.81* 2.26*  CALCIUM 9.0 8.9   GFR: CrCl cannot be calculated (Unknown ideal weight.). Liver Function Tests: Recent Labs  Lab 04/13/17 1853  AST 12*  ALT 10*  ALKPHOS 196*  BILITOT 1.0  PROT 8.1  ALBUMIN 3.8   Recent Labs  Lab 04/13/17 1853  LIPASE 26   No results for input(s): AMMONIA in the last 168 hours. Coagulation Profile: No results for input(s): INR, PROTIME in the last 168 hours. Cardiac Enzymes: No results for input(s): CKTOTAL, CKMB, CKMBINDEX, TROPONINI in the last 168 hours. BNP (last 3 results) No results for input(s): PROBNP in the last 8760 hours. HbA1C: Recent Labs    04/14/17 0252  HGBA1C 9.8*   CBG: Recent Labs  Lab 04/14/17 0105 04/14/17 0213  04/14/17 0301 04/14/17 0358 04/14/17 0717  GLUCAP 230* 130* 78 125* 106*   Lipid Profile: No results for input(s): CHOL, HDL, LDLCALC, TRIG, CHOLHDL, LDLDIRECT in the last 72 hours. Thyroid Function Tests: No results for input(s): TSH, T4TOTAL, FREET4, T3FREE, THYROIDAB in the last 72 hours. Anemia Panel: No results for input(s): VITAMINB12, FOLATE, FERRITIN, TIBC, IRON, RETICCTPCT in the last 72 hours. Urine analysis: No results found for: COLORURINE, APPEARANCEUR, LABSPEC, PHURINE, GLUCOSEU, HGBUR, BILIRUBINUR, KETONESUR, PROTEINUR, UROBILINOGEN, NITRITE, LEUKOCYTESUR Sepsis Labs: '@[email or web address]' (procalcitonin:4,lacticidven:4)  )No results found for this or any previous visit (from the past 240 hour(s)).       Radiology Studies: No results found.      Scheduled Meds: . insulin aspart  0-15 Units Subcutaneous TID WC  . insulin glargine  30 Units Subcutaneous QHS  . metoprolol tartrate  25 mg Oral BID   Continuous Infusions: . sodium chloride 125 mL/hr at 04/14/17 0315     LOS: 0 days    Time spent: 40 minutes    WOODS, Geraldo Docker, MD [facility] Pager [phone]   If 7PM-7AM, please contact night-coverage [email or web address] Password [email or web address] 04/14/2017, 8:29 AM

## 2017-04-15 DIAGNOSIS — E7849 Other hyperlipidemia: Secondary | ICD-10-CM

## 2017-04-15 LAB — BASIC METABOLIC PANEL
Anion gap: 5 (ref 5–15)
BUN: 16 mg/dL (ref 6–20)
CO2: 21 mmol/L — ABNORMAL LOW (ref 22–32)
Calcium: 8.6 mg/dL — ABNORMAL LOW (ref 8.9–10.3)
Chloride: 112 mmol/L — ABNORMAL HIGH (ref 101–111)
Creatinine, Ser: 1.75 mg/dL — ABNORMAL HIGH (ref 0.44–1.00)
GFR calc Af Amer: 43 mL/min — ABNORMAL LOW (ref >60)
GFR calc non Af Amer: 37 mL/min — ABNORMAL LOW (ref >60)
Glucose, Bld: 45 mg/dL — ABNORMAL LOW (ref 65–99)
Potassium: 3.4 mmol/L — ABNORMAL LOW (ref 3.5–5.1)
Sodium: 138 mmol/L (ref 135–145)

## 2017-04-15 LAB — GLUCOSE, CAPILLARY
Glucose-Capillary: 140 mg/dL — ABNORMAL HIGH (ref 65–99)
Glucose-Capillary: 158 mg/dL — ABNORMAL HIGH (ref 65–99)
Glucose-Capillary: 181 mg/dL — ABNORMAL HIGH (ref 65–99)
Glucose-Capillary: 195 mg/dL — ABNORMAL HIGH (ref 65–99)
Glucose-Capillary: 213 mg/dL — ABNORMAL HIGH (ref 65–99)
Glucose-Capillary: 37 mg/dL — CL (ref 65–99)
Glucose-Capillary: 42 mg/dL — CL (ref 65–99)
Glucose-Capillary: 67 mg/dL (ref 65–99)
Glucose-Capillary: 73 mg/dL (ref 65–99)
Glucose-Capillary: 94 mg/dL (ref 65–99)

## 2017-04-15 LAB — HCG, QUANTITATIVE, PREGNANCY: hCG, Beta Chain, Quant, S: <1 m[IU]/mL (ref <5)

## 2017-04-15 LAB — MAGNESIUM: Magnesium: 1.6 mg/dL — ABNORMAL LOW (ref 1.7–2.4)

## 2017-04-15 MED ORDER — POTASSIUM CHLORIDE CRYS ER 20 MEQ PO TBCR
50.0000 meq | EXTENDED_RELEASE_TABLET | Freq: Once | ORAL | Status: AC
  Administered 2017-04-15: 50 meq via ORAL
  Filled 2017-04-15: qty 1

## 2017-04-15 MED ORDER — INSULIN GLARGINE 100 UNIT/ML ~~LOC~~ SOLN
20.0000 [IU] | Freq: Every day | SUBCUTANEOUS | Status: DC
  Administered 2017-04-15: 20 [IU] via SUBCUTANEOUS
  Filled 2017-04-15: qty 0.2

## 2017-04-15 MED ORDER — METOPROLOL TARTRATE 50 MG PO TABS
75.0000 mg | ORAL_TABLET | Freq: Two times a day (BID) | ORAL | Status: DC
  Administered 2017-04-15 – 2017-04-16 (×2): 75 mg via ORAL
  Filled 2017-04-15 (×2): qty 1

## 2017-04-15 MED ORDER — ATORVASTATIN CALCIUM 20 MG PO TABS
20.0000 mg | ORAL_TABLET | Freq: Every day | ORAL | Status: DC
  Administered 2017-04-15: 20 mg via ORAL
  Filled 2017-04-15: qty 1

## 2017-04-15 MED ORDER — INSULIN GLARGINE 100 UNIT/ML ~~LOC~~ SOLN
40.0000 [IU] | Freq: Every day | SUBCUTANEOUS | Status: DC
  Filled 2017-04-15: qty 0.4

## 2017-04-15 NOTE — Progress Notes (Signed)
Hypoglycemic Event  CBG: 35/42  Treatment: 15 GM carbohydrate snack  Symptoms: Sweaty and Nervous/irritable  Follow-up CBG: Time:0430 CBG Result:67  Follow-up CBG: Time: 0523 CBG Result: 94  Possible Reasons for Event: Medication regimen: novolog at midnight and lantus HS no snack after midnight dose  Comments/MD notified: K.Kirby night coverage notified.     Kelly Huff

## 2017-04-15 NOTE — Progress Notes (Addendum)
Inpatient Diabetes Program Recommendations  AACE/ADA: New Consensus Statement on Inpatient Glycemic Control (2015)  Target Ranges:  Prepandial:   less than 140 mg/dL      Peak postprandial:   less than 180 mg/dL (1-2 hours)      Critically ill patients:  140 - 180 mg/dL   Lab Results  Component Value Date   GLUCAP 140 (H) 04/15/2017   HGBA1C 9.8 (H) 04/14/2017    Review of Glycemic Control Results for Kelly Huff, Kelly Huff (MRN [id number]) as of 04/15/2017 13:23  Ref. Range 04/15/2017 05:23 04/15/2017 06:55 04/15/2017 07:18 04/15/2017 10:20 04/15/2017 12:24  Glucose-Capillary Latest Ref Range: 65 - 99 mg/dL 94 37 (LL) 73 195 (H) 140 (H)    Diabetes history: DM1 Outpatient Diabetes medications: Lantus 60 units qd + Novolog 3-10 units tid meal coverage Current orders for Inpatient glycemic control: Lantus 45 units qd + Novolog correction sensitive q 4 hrs.  Inpatient Diabetes Program Recommendations:    -Decrease Lantus to 20 units daily -Change Novolog correction to tid -Novolog 3 units tid meal coverage if eats 50%  Spoke with patient today and patient states she was taking as Dr. Phylliss Bob office note reveals only Lantus 18 units daily, but increased after going to Dr. Jackson Latino to 60 units daily. Patient does admit to having low CBGs while on Lantus 60 and not taking meal coverage very often due to having lows. Reviewed with patient Lantus is to cover basal insulin needs and Novolog to coverage meal coverage and correction.  Thank you, [name]. Ernesha Ramone, RN, MSN, CDE  Diabetes Coordinator Inpatient Glycemic Control Team Team Pager [phone] (8am-5pm) 04/15/2017 1:43 PM

## 2017-04-15 NOTE — Progress Notes (Signed)
Initial Nutrition Assessment  DOCUMENTATION CODES:   Not applicable  INTERVENTION:   Continue Ensure Enlive po BID, each supplement provides 350 kcal and 20 grams of protein  Provided nutrition education regarding diabetes  NUTRITION DIAGNOSIS:   Unintentional weight loss related to other (see comment), poor appetite(Uncontrolled type I DM) as evidenced by per patient/family report.  GOAL:   Patient will meet greater than or equal to 90% of their needs  MONITOR:   PO intake, Supplement acceptance, Weight trends, I & O's  REASON FOR ASSESSMENT:   Malnutrition Screening Tool, Consult Diet education  ASSESSMENT:   Pt with PMH of diabetes and HTN presents with nausea and high blood sugar   Pt endorses weight loss, chart reveals 7.6% body weight loss in 3 months. Pt reports a fluctuating appetite the past couple of weeks.  Pt with multiple hypoglycemic events. Pt reports she has these often at baseline. Reviewed ways to manage these.   RD consulted for nutrition education regarding uncontrolled type I diabetes. Pt was diagnosed with type I diabetes at 31 years old so received prior education but states it is nice to receive a refresher.   Lab Results  Component Value Date   HGBA1C 9.8 (H) 04/14/2017   RD provided "Type I Diabetes Nutrition Therapy" handout from the Academy of [employer]. Discussed different food groups and their effects on blood sugar, emphasizing carbohydrate-containing foods. Provided list of carbohydrates and recommended serving sizes of common foods.  Discussed importance of controlled and consistent carbohydrate intake throughout the day. Provided examples of ways to balance meals/snacks and encouraged intake of high-fiber, whole grain complex carbohydrates. Teach back method used.  Per chart, pt changed diabetes medication 2 weeks ago and her glucose has been elevated since.   Expect fair compliance. Recommend pt follow-up with  endocrinologist and outpatient RD center to adjust her care plan   Labs reviewed; CBG 35-279, K 3.4, Magnesium 1.6,  Medications reviewed; sliding scale insulin, Lantus  NUTRITION - FOCUSED PHYSICAL EXAM:    Most Recent Value  Orbital Region  No depletion  Upper Arm Region  No depletion  Thoracic and Lumbar Region  No depletion  Buccal Region  No depletion  Temple Region  No depletion  Clavicle Bone Region  No depletion  Clavicle and Acromion Bone Region  No depletion  Scapular Bone Region  No depletion  Dorsal Hand  No depletion  Patellar Region  No depletion  Anterior Thigh Region  No depletion  Posterior Calf Region  No depletion  Edema (RD Assessment)  None       Diet Order:  Diet heart healthy/carb modified Room service appropriate? Yes; Fluid consistency: Thin  EDUCATION NEEDS:   No education needs have been identified at this time  Skin:  Skin Assessment: Reviewed RN Assessment  Last BM:  04/14/17  Height:   Ht Readings from Last 1 Encounters:  04/14/17 5\' 7"  (1.702 m)    Weight:   Wt Readings from Last 1 Encounters:  04/14/17 184 lb 4.9 oz (83.6 kg)    Ideal Body Weight:  61.4 kg  BMI:  Body mass index is 28.87 kg/m.  Estimated Nutritional Needs:   Kcal:  2000-2200  Protein:  100-110 grams  Fluid:  >/= 2 L/d  Parks Ranger, MS, RDN, LDN 04/15/2017 1:01 PM

## 2017-04-15 NOTE — Progress Notes (Signed)
Hypoglycemic Event  CBG: 37  Treatment: 15 GM carbohydrate snack  Symptoms: Sweaty and Shaky  Follow-up CBG: Time:0718 CBG Result:73  Possible Reasons for Event: Inadequate meal intake and Other: patient states this happens just before six every morning  Comments/MD notified: Patient communicated she was feeling her blood sugar level decreased again. Notified incoming nurse.    Maricela Schreur

## 2017-04-15 NOTE — Progress Notes (Signed)
PROGRESS NOTE    Kelly Huff  GNF:[id number] DOB: 02-09-1986 DOA: 04/13/2017 PCP: Lin Landsman, MD   Brief Narrative:   31 y.o. BF PMHx HTN, Preeclampsia, DM type 1 since age 8 years old no previous hospitalizations for DKA in the past and reports she was changed from her lantus/novolog to trulicity only 2 weeks ago.  Her glucose has been high since.  She actually lost her glucometer and is in the process of getting another one but her insurance company is giving problems for paying for it and she might get one of Friday (today is Tuesday).  She denies any fevers.  She has been having a foul taste to her mouth.  No vomiting but naseated.  No urinary symtpoms.  Pt found to have glucose over 700 with normal gap and referred for admission for uncontrolled diabetes.     Subjective: 12/20  A/O 4, negative CP, negative SOB, negative abdominal pain. Negative N/V. States woke up this a.m. shaky positive diaphoresis and she was hypoglycemic. Per patient and husband will wake up when hypoglycemic therefore able to take corrective action.    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 Patient had not been taking as much Lantus as first indicated and had not been taking NovoLog for meal coverage.  -12/20 decrease Lantus to 20 units QHS; if patient has controlled CBG overnight discharge in a.m.  -Sensitive SSI. No meal coverage. -Discuss patient with  Dr.Jeffrey Louanna Raw Endocrinologist who agreed to see patient on to January _0   establish care appointment.  HLD -Lipid panel not within ADA guidelines -Start Lipitor 20 mg daily  Noncompliance with medication -Patient dismissed from Dr. Mickie Kay, practice endocrinologist due to missing too many appointments(per patient secondary to childbirth and starting school). -diabetic coordinator has spoken with patient at length. Stated placed on Trulicity by Dr. Jackson Latino which is why she discontinued using her insulin  -nutrition consult placed; Uncontrolled diabetic needs training in diabetic diet  Essential hypertension -12/20 increase increase Metoprolol 75 mg BID -Hold ACEI/ARB secondary to acute renal failure. If necessary add Hydralazine  Acute renal failure -Beginning to resolve with resolution of HHNC -Trend creatinine Recent Labs  Lab 04/13/17 1853 04/14/17 0250 04/14/17 1152 04/15/17 0348  CREATININE 2.81* 2.26* 1.98* 1.75*  -Avoid nephrotoxic medication  Hypokalemia -K-Dur 37mq     DVT prophylaxis: lovenox Code Status: full Family Communication: family at bedside for discussion of plan care Disposition Plan: expect discharge on 12/20   Consultants:  Diabetic coordinator Diabetic nutritionist   Procedures/Significant Events:  none   I have personally reviewed and interpreted all radiology studies and my findings are as above.  VENTILATOR SETTINGS: none   Cultures one  Antimicrobials:    Devices    LINES / TUBES:      Continuous Infusions: . sodium chloride 125 mL/hr at 04/15/17 0200     Objective: Vitals:   04/14/17 1647 04/14/17 1726 04/14/17 2132 04/15/17 0403  BP: (!) 164/94 (!) 156/96 (!) 148/94 (!) 154/96  Pulse: 99 100 97 (!) 105  Resp: _1 Temp:  98.2 F (36.8 C)  97.7 F (36.5 C) 98.1 F (36.7 C)  TempSrc: Oral  Oral Oral  SpO2: 100%  100% 100%  Weight: 184 lb 4.9 oz (83.6 kg)     Height: _0  (1.702 m)       Intake/Output Summary (Last 24 hours) at 04/15/2017 0851 Last data filed at  04/15/2017 0405 Gross per 24 hour  Intake 1664.45 ml  Output 800 ml  Net 864.45 ml   Filed Weights   04/14/17 1647  Weight: 184 lb 4.9 oz (83.6 kg)    Physical Exam:  General: A/O 4, No acute respiratory distress Neck:  Negative scars, masses, torticollis, lymphadenopathy, JVD Lungs: Clear to auscultation bilaterally without wheezes or crackles Cardiovascular: Regular rate and rhythm without murmur gallop or rub normal S1 and S2 Abdomen: negative abdominal pain, nondistended, positive soft, bowel sounds, no rebound, no ascites, no appreciable mass Extremities: No significant cyanosis, clubbing, or edema bilateral lower extremities Skin: Negative rashes, lesions, ulcers Psychiatric:  Negative depression, negative anxiety, negative fatigue, negative mania  Central nervous system:  Cranial nerves II through XII intact, tongue/uvula midline, all extremities muscle strength 5/5, sensation intact throughout, f negative dysarthria, negative expressive aphasia, negative receptive aphasia. .     Data Reviewed: Care during the described time interval was provided by me .  I have reviewed this patient's available data, including medical history, events of note, physical examination, and all test results as part of my evaluation.   CBC: Recent Labs  Lab 04/13/17 1853 04/14/17 0250  WBC 7.3 7.0  HGB 10.9* 10.1*  HCT 31.5* 28.5*  MCV 80.2 77.0*  PLT 328 119   Basic Metabolic Panel: Recent Labs  Lab 04/13/17 1853 04/14/17 0250 04/14/17 1152 04/15/17 0348  NA 122* 134* 127* 138  K 4.7 3.2* 3.4* 3.4*  CL 88* 103 100* 112*  CO2 20* 24 20* 21*  GLUCOSE 789* 76 255* 45*  BUN 26* 21* 19 16  CREATININE 2.81* 2.26* 1.98* 1.75*  CALCIUM 9.0 8.9 8.4* 8.6*  MG  --   --  1.7 1.6*   GFR: Estimated Creatinine Clearance: 50.8 mL/min (A) (by C-G formula based on SCr of 1.75 mg/dL (H)). Liver Function Tests: Recent Labs  Lab 04/13/17 1853  AST 12*  ALT 10*  ALKPHOS 196*  BILITOT 1.0   PROT 8.1  ALBUMIN 3.8   Recent Labs  Lab 04/13/17 1853  LIPASE 26   No results for input(s): AMMONIA in the last 168 hours. Coagulation Profile: No results for input(s): INR, PROTIME in the last 168 hours. Cardiac Enzymes: No results for input(s): CKTOTAL, CKMB, CKMBINDEX, TROPONINI in the last 168 hours. BNP (last 3 results) No results for input(s): PROBNP in the last 8760 hours. HbA1C: Recent Labs    04/14/17 0252  HGBA1C 9.8*   CBG: Recent Labs  Lab 04/15/17 0400 04/15/17 0427 04/15/17 0523 04/15/17 0655 04/15/17 0718  GLUCAP 42* 67 94 37* 73   Lipid Profile: Recent Labs    04/14/17 1152  CHOL 175  HDL 32*  LDLCALC 100*  TRIG 214*  CHOLHDL 5.5   Thyroid Function Tests: No results for input(s): TSH, T4TOTAL, FREET4, T3FREE, THYROIDAB in the last 72 hours. Anemia Panel: No results for input(s): VITAMINB12, FOLATE, FERRITIN, TIBC, IRON, RETICCTPCT in the last 72 hours. Urine analysis:    Component Value Date/Time   COLORURINE YELLOW 04/13/2017 1846   APPEARANCEUR CLOUDY (A) 04/13/2017 1846   LABSPEC 1.012 04/13/2017 1846   PHURINE 5.0 04/13/2017  1846   GLUCOSEU >=500 (A) 04/13/2017 1846   HGBUR SMALL (A) 04/13/2017 1846   BILIRUBINUR NEGATIVE 04/13/2017 1846   KETONESUR 5 (A) 04/13/2017 1846   PROTEINUR >=300 (A) 04/13/2017 1846   NITRITE NEGATIVE 04/13/2017 1846   LEUKOCYTESUR SMALL (A) 04/13/2017 1846   Sepsis Labs: _0 (procalcitonin:4,lacticidven:4)  )No results found for this or any previous visit (from the past 240 hour(s)).       Radiology Studies: No results found.      Scheduled Meds: . enoxaparin (LOVENOX) injection  40 mg Subcutaneous Q24H  . feeding supplement (ENSURE ENLIVE)  237 mL Oral BID BM  . Influenza vac split quadrivalent PF  0.5 mL Intramuscular Tomorrow-1000  . insulin aspart  0-9 Units Subcutaneous Q4H  . insulin glargine  40 Units Subcutaneous QHS  . metoprolol tartrate  50 mg Oral BID  . potassium  chloride  40 mEq Oral Once   Continuous Infusions: . sodium chloride 125 mL/hr at 04/15/17 0200     LOS: 1 day    Time spent: 40 minutes    Barrett Holthaus, Geraldo Docker, MD [facility] Pager [phone]   If 7PM-7AM, please contact night-coverage [email or web address] Password [facility] 04/15/2017, 8:51 AM

## 2017-04-16 DIAGNOSIS — E785 Hyperlipidemia, unspecified: Secondary | ICD-10-CM

## 2017-04-16 LAB — BASIC METABOLIC PANEL
Anion gap: 3 — ABNORMAL LOW (ref 5–15)
BUN: 14 mg/dL (ref 6–20)
CO2: 22 mmol/L (ref 22–32)
Calcium: 8.6 mg/dL — ABNORMAL LOW (ref 8.9–10.3)
Chloride: 111 mmol/L (ref 101–111)
Creatinine, Ser: 1.7 mg/dL — ABNORMAL HIGH (ref 0.44–1.00)
GFR calc Af Amer: 45 mL/min — ABNORMAL LOW (ref >60)
GFR calc non Af Amer: 39 mL/min — ABNORMAL LOW (ref >60)
Glucose, Bld: 83 mg/dL (ref 65–99)
Potassium: 4 mmol/L (ref 3.5–5.1)
Sodium: 136 mmol/L (ref 135–145)

## 2017-04-16 LAB — GLUCOSE, CAPILLARY
Glucose-Capillary: 110 mg/dL — ABNORMAL HIGH (ref 65–99)
Glucose-Capillary: 128 mg/dL — ABNORMAL HIGH (ref 65–99)
Glucose-Capillary: 154 mg/dL — ABNORMAL HIGH (ref 65–99)
Glucose-Capillary: 35 mg/dL — CL (ref 65–99)
Glucose-Capillary: 63 mg/dL — ABNORMAL LOW (ref 65–99)
Glucose-Capillary: 88 mg/dL (ref 65–99)

## 2017-04-16 LAB — MAGNESIUM: Magnesium: 1.5 mg/dL — ABNORMAL LOW (ref 1.7–2.4)

## 2017-04-16 MED ORDER — INSULIN GLARGINE 100 UNIT/ML ~~LOC~~ SOLN: 18.0000 [IU] | Freq: Every day | SUBCUTANEOUS | Status: DC

## 2017-04-16 MED ORDER — INSULIN ASPART 100 UNIT/ML ~~LOC~~ SOLN
0.0000 [IU] | Freq: Three times a day (TID) | SUBCUTANEOUS | Status: DC
  Administered 2017-04-16: 2 [IU] via SUBCUTANEOUS

## 2017-04-16 MED ORDER — LANTUS SOLOSTAR 100 UNIT/ML ~~LOC~~ SOPN: 20.0000 [IU] | PEN_INJECTOR | Freq: Every day | SUBCUTANEOUS | 0 refills | Status: DC

## 2017-04-16 MED ORDER — METOPROLOL TARTRATE 75 MG PO TABS: 75.0000 mg | ORAL_TABLET | Freq: Two times a day (BID) | ORAL | 0 refills | Status: DC

## 2017-04-16 MED ORDER — NOVOLOG FLEXPEN 100 UNIT/ML ~~LOC~~ SOPN: PEN_INJECTOR | SUBCUTANEOUS | 0 refills | Status: DC

## 2017-04-16 MED ORDER — ATORVASTATIN CALCIUM 20 MG PO TABS: 20.0000 mg | ORAL_TABLET | Freq: Every day | ORAL | 0 refills | Status: DC

## 2017-04-16 MED ORDER — ISOSORB DINITRATE-HYDRALAZINE 20-37.5 MG PO TABS: 1.0000 | ORAL_TABLET | Freq: Two times a day (BID) | ORAL | 0 refills | Status: DC

## 2017-04-16 MED ORDER — ISOSORB DINITRATE-HYDRALAZINE 20-37.5 MG PO TABS
1.0000 | ORAL_TABLET | Freq: Two times a day (BID) | ORAL | Status: DC
  Administered 2017-04-16: 1 via ORAL
  Filled 2017-04-16: qty 1

## 2017-04-16 MED ORDER — INSULIN GLARGINE 100 UNIT/ML ~~LOC~~ SOLN: 20.0000 [IU] | Freq: Every day | SUBCUTANEOUS | Status: DC

## 2017-04-16 NOTE — Discharge Summary (Addendum)
DISCHARGE SUMMARY  Kelly Huff  MR#: [id number]  DOB:1985-08-27  Date of Admission: 04/13/2017 Date of Discharge: 04/16/2017  Attending Physician:Luan Urbani T Betzalel Umbarger  Patient's MVH:QIONG, Zadie Cleverly, MD  Consults: none  Disposition: D/C home   Follow-up Appts: Follow-up Information    Delrae Rend, MD. Schedule an appointment as soon as possible for a visit on 04/28/2017.   Specialty:  Endocrinology Why:   Veto [place] Endocrinologist  @  2 pm Contact information: [address] [place] [address] [phone]        Follow up Follow up.   Why:  NS spoke with scheduler TJ, she stated this is the 1st visit for the patient, Dr. Dellis Filbert would need to "REVIEW" patient before an appt, and records would need to be faxed (year of labs, diagnosis) [id number]. NS alert RN of barrier on 04/14/17 @ 1643. Contact information: [place]       Follow up Follow up.   Why:  NS Spoke with TJ again and suggested the urgency of the appt. NS alert RN of the requested paperwork RN request disclosure from patient. Gave TJ Dr. Seward Speck pager [phone] so the two may connect directly. NS alert RN of barrier on 04/15/17 1444.        Lin Landsman, MD. Schedule an appointment as soon as possible for a visit in 1 week(s).   Specialty:  Family Medicine Contact information: [place] [address] [phone]           Tests Needing Follow-up: -assess CBG control -assess BP control  -check renal function  -check LFTs and lipid levels w/ pt newly started on Lipitor   Discharge Diagnoses: DM1 uncontrolled - Hyperosmolarity w/o coma  HLD HTN Acute renal failure Hypokalemia  Initial presentation: 31 y.o.F w/ a Hxof HTN, Preeclampsia, and DM1 since age 72 who was changed from her lantus/novolog to trulicity 2 weeks prior to admission. Her CBGs had been high since. She then lost her glucometer and was in the process of getting  another one, though she encountered insurance red tape. She presented with an initial CBG >700.    Hospital Course:  DM1 uncontrolled - HONK Transitioned off insulin drip - diabetic coordinator investigated and reported outpt tx plan:  Lantus 60 units qd + Novolog 3-10 units tid meal coverage - suffered numerous hypoglycemic events on Lantus at 60U - 12/20 decreased Lantus to 20 unitsQHS + sensitive SSI w/ no meal coverage - Dr.Lexy Meininger KerrEagleEndocrinologistagreed to see patient on January 2 @1400to  establish care - pt confirms she has CBG meter at home - feels comfortable monitoring her values at home and requests d/c home   HLD Lipid panel not within ADA guidelines - started Lipitor20 mg daily  HTN no ACEI/ARB secondary to acute renal failure - discussed need for better BP control w/ pt, especially in setting of DM - she voiced understanding - begin BiDil as ACEi currently not safe to initiate   Acute renal failure crt improved with resolution of HHNC - will need outpt f/u   Hypokalemia Corrected w/ supplementation   Allergies as of 04/16/2017      Reactions   Morphine    Other reaction(s): Hallucination   Sulfamethoxazole-trimethoprim Hives      Medication List    STOP taking these medications   metroNIDAZOLE 0.75 % vaginal gel Commonly known as:  METROGEL VAGINAL   tranexamic acid 650 MG Tabs tablet Commonly known as:  LYSTEDA  TAKE these medications   atorvastatin 20 MG tablet Commonly known as:  LIPITOR Take 1 tablet (20 mg total) by mouth daily at 6 PM.   cyclobenzaprine 10 MG tablet Commonly known as:  FLEXERIL Take 10 mg by mouth 3 (three) times daily as needed for muscle spasms.   isosorbide-hydrALAZINE 20-37.5 MG tablet Commonly known as:  BIDIL Take 1 tablet by mouth 2 (two) times daily.   LANTUS SOLOSTAR 100 UNIT/ML Solostar Pen Generic drug:  Insulin Glargine Inject 20 Units into the skin at bedtime. What changed:  how much to take     Metoprolol Tartrate 75 MG Tabs Take 75 mg by mouth 2 (two) times daily. What changed:    medication strength  how much to take   NOVOLOG FLEXPEN 100 UNIT/ML FlexPen Generic drug:  insulin aspart 0-9 units injected 3 times daily with meals according to the following scale: CBG 70-120 = 0 units CBG 121-150 = 1 unit CBG 151-200 = 2 units CBG 201-250 = 3 units CBG 251-300 = 5 units CBG 301-350 = 7 units CBG 351-400 = 9 units CBG >400 = call MD for advice What changed:  See the new instructions.   ranitidine 150 MG capsule Commonly known as:  ZANTAC Take 150 mg by mouth every evening.       Day of Discharge BP (!) 157/97   Pulse 96   Temp 98.3 F (36.8 C)   Resp 16   Ht 5\' 7"  (1.702 m)   Wt 83.6 kg (184 lb 4.9 oz)   LMP 04/03/2017   SpO2 100%   BMI 28.87 kg/m   Physical Exam: General: No acute respiratory distress Lungs: Clear to auscultation bilaterally without wheezes or crackles Cardiovascular: Regular rate and rhythm without murmur gallop or rub normal S1 and S2 Abdomen: Nontender, nondistended, soft, bowel sounds positive, no rebound, no ascites, no appreciable mass Extremities: No significant cyanosis, clubbing, or edema bilateral lower extremities  Basic Metabolic Panel: Recent Labs  Lab 04/13/17 1853 04/14/17 0250 04/14/17 1152 04/15/17 0348 04/16/17 0625  NA 122* 134* 127* 138 136  K 4.7 3.2* 3.4* 3.4* 4.0  CL 88* 103 100* 112* 111  CO2 20* 24 20* 21* 22  GLUCOSE 789* 76 255* 45* 83  BUN 26* 21* 19 16 14   CREATININE 2.81* 2.26* 1.98* 1.75* 1.70*  CALCIUM 9.0 8.9 8.4* 8.6* 8.6*  MG  --   --  1.7 1.6* 1.5*    Liver Function Tests: Recent Labs  Lab 04/13/17 1853  AST 12*  ALT 10*  ALKPHOS 196*  BILITOT 1.0  PROT 8.1  ALBUMIN 3.8   Recent Labs  Lab 04/13/17 1853  LIPASE 26    CBC: Recent Labs  Lab 04/13/17 1853 04/14/17 0250  WBC 7.3 7.0  HGB 10.9* 10.1*  HCT 31.5* 28.5*  MCV 80.2 77.0*  PLT 328 295    CBG: Recent Labs   Lab 04/15/17 2017 04/16/17 0022 04/16/17 0427 04/16/17 0733 04/16/17 0816  GLUCAP 181* 128* 110* 63* 88    Time spent in discharge (includes decision making & examination of pt): 35 minutes  04/16/2017, 11:36 AM   Cherene Altes, MD [facility] Office  [phone] Pager [phone]  On-Call/Text Page:      [email or web address]      password [facility]

## 2017-04-16 NOTE — Progress Notes (Signed)
Handoff given to [employer] . Patient for potential discharge today.

## 2017-04-16 NOTE — Plan of Care (Signed)
progressing 

## 2017-04-16 NOTE — Discharge Instructions (Signed)
Insulin Treatment for Diabetes Diabetes (diabetes mellitus) is a long-term (chronic) disease. It occurs when the body does not properly use sugar (glucose) that is released from food after digestion. Glucose levels are controlled by a hormone called insulin, which is made in the pancreas.  If you have type 1 diabetes, the pancreas does not make any insulin, so you must take insulin.  If you have type 2 diabetes, you might need to take insulin along with other medicines. In type 2 diabetes, one or both of these problems may be present: ? The pancreas does not make enough insulin. ? Cells in the body do not respond properly to insulin that the body makes (insulin resistance).  You must use insulin correctly to control your diabetes. You must have some insulin in your body at all times. Insulin treatment varies depending on your type of diabetes, your treatment goals, and your medical history. It is important for you to understand your insulin treatment plan so you can be an active partner in managing your diabetes. How is insulin given? Insulin can only be given through a shot (injection). It is injected using a syringe and needle, an insulin pen, a pump, or a jet injector. Your health care provider will:  Prescribe the amount and type of insulin that you need.  Tell you when you should inject your insulin.  Where on the body should insulin be injected? Insulin is injected into a layer of fatty tissue under the skin. Good places to inject insulin include:  Abdomen. Generally, the abdomen is the best place to inject insulin. However, you should avoid any area that is less than 2 inches (5 cm) from the belly button (navel).  Front and outer area of the upper thighs.  The back of the upper arms.  Upper buttocks.  It is important to:  Give your injection in a slightly different place each time. This helps to prevent irritation and improve absorption.  Avoid injecting into areas that have  scar tissue.  Usually, you will give yourself insulin injections. Others can also be taught how to give you injections. You will use a special type of syringe that is made only for insulin. Some people may have an insulin pump that delivers insulin steadily through a tube (cannula) that is placed under the skin. What are the different types of insulin? The following information is a general guide to different types of insulin. Specifics vary depending on the insulin product that your health care provider prescribes.  Rapid-acting insulin: ? Starts working quickly, in as little as 5 minutes. ? Can last for 4-6 hours, or sometimes longer. ? Works well when taken right before a meal to quickly lower blood glucose.  Short-acting insulin: ? Starts working in about 30 minutes. ? Can last for 6-10 hours. ? Should be taken about 30 minutes before you start eating a meal.  Intermediate-acting insulin: ? Starts working in 1-2 hours. ? Lasts for about 10-18 hours. ? Lowers your blood glucose for a longer period of time but is not as effective for lowering blood glucose right after a meal.  Long-acting insulin: ? Mimics the small amount of insulin that your pancreas usually produces throughout the day. ? Should be used either one or two times a day. ? Is usually used in combination with other types of insulin or other medicines.  Concentrated insulin, or U-500 insulin: ? Contains a higher dose of insulin than most rapid-acting insulins. U-500 insulin has 5 times the amount  of insulin per 1 mL. ? Should only be used with the special U-500 syringe or U-500 insulin pen. It is dangerous to use the wrong type of syringe with this insulin.  What are the side effects of insulin? Possible side effects of insulin treatment include:  Low blood glucose (hypoglycemia).  Weight gain.  High blood glucose (hyperglycemia).  Skin injury or irritation.  Some of these side effects can be caused by using  improper injection technique. It is important to learn to inject insulin properly. What are common terms associated with insulin treatment? Some terms that you might hear include:  Basal insulin, or basal rate. This is the constant amount of insulin that needs to be present in your body to stabilize your blood glucose levels. People who have type 1 diabetes need basal insulin in a steady (continuous) dose 24 hours a day. ? Usually, intermediate-acting or long-acting insulin is used one or two times a day to manage basal insulin levels. ? Medicines that are taken by mouth may also be recommended to manage basal insulin levels.  Prandial insulin. This refers to meal-related insulin. ? Blood glucose rises quickly after a meal (postprandial). Rapid-acting or short-acting insulin can be used right before a meal (preprandial) to quickly lower blood glucose. ? You may be instructed to adjust the amount of prandial insulin that you take depending on how much carbohydrate (starch) is in your meal.  Corrective insulin. This may also be called a correction dose or supplemental dose. This is a small amount of rapid-acting or short-acting insulin that can be used to lower blood glucose if it is too high. You may be instructed to check your blood glucose at certain times of the day and use corrective insulin as needed.  Tight control, or intensive therapy. This means keeping your blood glucose as close to your target as possible, and preventing it from getting too high after meals. People who have tight control of their diabetes have fewer long-term problems caused by diabetes.  General instructions  Talk with your health care provider or pharmacist about the type of insulin you should take and when you should take it. You should know when your insulin peaks and when it wears off. You need this information so you can plan your meals and exercise. You also need to work with your health care provider to:  Check  your blood glucose every day. Your health care provider will tell you how often and when you should do this.  Manage your: ? Weight. ? Blood pressure. ? Cholesterol. ? Stress.  Eat a healthy diet.  Exercise regularly.  This information is not intended to replace advice given to you by your health care provider. Make sure you discuss any questions you have with your health care provider. Document Released: 07/10/2008 Document Revised: 09/19/2015 Document Reviewed: 05/17/2015 Elsevier Interactive Patient Education  [employer].

## 2017-05-04 ENCOUNTER — Other Ambulatory Visit: Payer: Self-pay

## 2017-05-04 ENCOUNTER — Emergency Department (HOSPITAL_COMMUNITY)
Admission: EM | Admit: 2017-05-04 | Discharge: 2017-05-05 | Disposition: A | Discharge status: Home / Self Care | Payer: Medicaid Other | Attending: Emergency Medicine | Admitting: Emergency Medicine

## 2017-05-04 ENCOUNTER — Encounter (HOSPITAL_COMMUNITY): Payer: Self-pay | Admitting: Emergency Medicine

## 2017-05-04 DIAGNOSIS — Z5321 Procedure and treatment not carried out due to patient leaving prior to being seen by health care provider: Secondary | ICD-10-CM | POA: Diagnosis not present

## 2017-05-04 DIAGNOSIS — R109 Unspecified abdominal pain: Secondary | ICD-10-CM | POA: Insufficient documentation

## 2017-05-04 LAB — COMPREHENSIVE METABOLIC PANEL
ALT: 16 U/L (ref 14–54)
AST: 23 U/L (ref 15–41)
Albumin: 3.1 g/dL — ABNORMAL LOW (ref 3.5–5.0)
Alkaline Phosphatase: 118 U/L (ref 38–126)
Anion gap: 7 (ref 5–15)
BUN: 30 mg/dL — ABNORMAL HIGH (ref 6–20)
CO2: 22 mmol/L (ref 22–32)
Calcium: 8.6 mg/dL — ABNORMAL LOW (ref 8.9–10.3)
Chloride: 104 mmol/L (ref 101–111)
Creatinine, Ser: 1.94 mg/dL — ABNORMAL HIGH (ref 0.44–1.00)
GFR calc Af Amer: 38 mL/min — ABNORMAL LOW (ref >60)
GFR calc non Af Amer: 33 mL/min — ABNORMAL LOW (ref >60)
Glucose, Bld: 134 mg/dL — ABNORMAL HIGH (ref 65–99)
Potassium: 4.1 mmol/L (ref 3.5–5.1)
Sodium: 133 mmol/L — ABNORMAL LOW (ref 135–145)
Total Bilirubin: 0.6 mg/dL (ref 0.3–1.2)
Total Protein: 6.8 g/dL (ref 6.5–8.1)

## 2017-05-04 LAB — URINALYSIS, ROUTINE W REFLEX MICROSCOPIC
Bilirubin Urine: NEGATIVE
Glucose, UA: 50 mg/dL — AB
Hgb urine dipstick: NEGATIVE
Ketones, ur: NEGATIVE mg/dL
Nitrite: NEGATIVE
Protein, ur: 100 mg/dL — AB
Specific Gravity, Urine: 1.012 (ref 1.005–1.030)
pH: 6 (ref 5.0–8.0)

## 2017-05-04 LAB — CBC
HCT: 29.6 % — ABNORMAL LOW (ref 36.0–46.0)
Hemoglobin: 9.2 g/dL — ABNORMAL LOW (ref 12.0–15.0)
MCH: 25.6 pg — ABNORMAL LOW (ref 26.0–34.0)
MCHC: 31.1 g/dL (ref 30.0–36.0)
MCV: 82.5 fL (ref 78.0–100.0)
Platelets: 288 10*3/uL (ref 150–400)
RBC: 3.59 MIL/uL — ABNORMAL LOW (ref 3.87–5.11)
RDW: 14.2 % (ref 11.5–15.5)
WBC: 7.9 10*3/uL (ref 4.0–10.5)

## 2017-05-04 LAB — I-STAT BETA HCG BLOOD, ED (MC, WL, AP ONLY): I-stat hCG, quantitative: <5 m[IU]/mL (ref <5)

## 2017-05-04 LAB — LIPASE, BLOOD: Lipase: 33 U/L (ref 11–51)

## 2017-05-04 MED ORDER — FENTANYL CITRATE (PF) 100 MCG/2ML IJ SOLN
50.0000 ug | Freq: Once | INTRAMUSCULAR | Status: AC
  Administered 2017-05-04: 50 ug via NASAL
  Filled 2017-05-04: qty 2

## 2017-05-04 MED ORDER — ONDANSETRON 4 MG PO TBDP
4.0000 mg | ORAL_TABLET | Freq: Once | ORAL | Status: AC | PRN
  Administered 2017-05-04: 4 mg via ORAL
  Filled 2017-05-04: qty 1

## 2017-05-04 NOTE — ED Notes (Signed)
Pt stated that she wanted to leave.  This tech advised the pt that her labs were not ideal and it would be best to stay.  After consulting with Jessica-triage nurse the pt was advised to stay but that we could not move her ahead of others.  PT LWBS

## 2017-05-04 NOTE — ED Notes (Signed)
Lab work, radiology results and vital signs reviewed, no critical results at this time, no change in acuity indicated.  

## 2017-05-04 NOTE — ED Triage Notes (Signed)
Pt presents to ED for RUQ pain starting this morning and worsening throughout the day.  Patient tearful in triage.  Pt c/o a "hot, leaking" sensation.  Pt still has gallbladder and appendix.

## 2017-05-18 ENCOUNTER — Encounter: Payer: Medicaid Other | Attending: Internal Medicine | Admitting: *Deleted

## 2017-05-18 DIAGNOSIS — Z713 Dietary counseling and surveillance: Secondary | ICD-10-CM | POA: Insufficient documentation

## 2017-05-18 DIAGNOSIS — E1065 Type 1 diabetes mellitus with hyperglycemia: Secondary | ICD-10-CM | POA: Insufficient documentation

## 2017-05-18 DIAGNOSIS — E103219 Type 1 diabetes mellitus with mild nonproliferative diabetic retinopathy with macular edema, unspecified eye: Secondary | ICD-10-CM

## 2017-05-25 NOTE — Progress Notes (Signed)
Insulin Pump and / or CGM Evaluation Visit:  Date: 05/18/2017   Appt start time: 1530 end time:  1700.  Assessment:  This patient has DM 1 and their primary concerns today: to learn more about pump and CGM options.  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, full time between both.Patient states knowledge of Carb Counting is 5 on a scale of 1-10 Patient states they are currently testing BG more than 4 times per day Last A1c was 9.5%   Current barriers to managing their diabetes: Patient states:  Time consuming  MEDICATIONS: Basal Insulin: 20 units of Lantus at night via pen  Bolus Insulin: variable units of Novlog at each meal based on BG reading via pen  Total Daily Dose of insulin per day 40 Other diabetes medications: no  Progress Towards Obtaining an Insulin Pump Goal(s):   Patient states their expectations of pump therapy include: better BG control and more information about BG patterns with use of CGM.    Intervention:    Taught difference between delivery of insulin via syringe/pen compared to insulin pump.  Demonstrated improved insulin delivery via pump due to improved accuracy of dose and flexibility of adjusting bolus insulin based on carb intake and BG correction.  Showed patient the following pumps: Medtronic, OmniPod  Showed patient the following CGM: Medtronic, Dexcom and Libre  Demonstrated pump, insulin reservoir and infusion set options, and button pushing for bolus delivery of insulin through the pump  Explained importance of testing BG at least 4 times per day for appropriate correction of high BG and prevention of DKA as applicable.  Emphasized importance of follow up after Pump Start for appropriate pump setting adjustments and on-going training on more advanced features.  Discussed current Continuous Glucose Monitoring options including difference between [place] and other CGM in terms of no  transmitter, so no alarms, but less expensive  Discussed Carb Counting by [employer] as back up to label reading and APPS  Handouts given during visit include:  Introduction to Pump Therapy Handout  DM 1/Pump Support Group Flyer  Insulin Pump and /or CGM Packet from Omni pod and Libre  Carb Counting handout   Monitoring/Evaluation:    Patient does  want to continue with pursuit of Omnipod insulin pump.  Patient does  want to continue with pursuit of Libre CGM to see what cost will be as Medicaid does not cover Dexcom yet.    Patient to contact local [place] so they can start the process of obtaining the pump. Contact information provided to the patient.  Patient instructed to go to that pump web-site to complete any learning module on insulin pump / CGM prior to next visit  Pump training to be done by pump company, I am available for setting adjustments if needed.

## 2017-06-10 ENCOUNTER — Encounter: Payer: Self-pay | Admitting: Obstetrics and Gynecology

## 2017-06-10 ENCOUNTER — Ambulatory Visit (INDEPENDENT_AMBULATORY_CARE_PROVIDER_SITE_OTHER): Payer: Medicaid Other | Admitting: Obstetrics and Gynecology

## 2017-06-10 VITALS — BP 156/87 | HR 92 | Ht 67.0 in | Wt 191.3 lb

## 2017-06-10 DIAGNOSIS — O24011 Pre-existing diabetes mellitus, type 1, in pregnancy, first trimester: Secondary | ICD-10-CM

## 2017-06-10 DIAGNOSIS — Z3687 Encounter for antenatal screening for uncertain dates: Secondary | ICD-10-CM

## 2017-06-10 DIAGNOSIS — N926 Irregular menstruation, unspecified: Secondary | ICD-10-CM

## 2017-06-10 DIAGNOSIS — O09291 Supervision of pregnancy with other poor reproductive or obstetric history, first trimester: Secondary | ICD-10-CM

## 2017-06-10 DIAGNOSIS — O10911 Unspecified pre-existing hypertension complicating pregnancy, first trimester: Secondary | ICD-10-CM

## 2017-06-10 LAB — POCT URINE PREGNANCY: Preg Test, Ur: POSITIVE — AB

## 2017-06-10 MED ORDER — LABETALOL HCL 200 MG PO TABS: 200.0000 mg | ORAL_TABLET | Freq: Two times a day (BID) | ORAL | 3 refills | Status: DC

## 2017-06-10 NOTE — Progress Notes (Signed)
   GYNECOLOGY CLINIC PROGRESS NOTE  Subjective:    Kelly Huff is a 32 y.o. female who presents for evaluation of menstrual irregularity. She believes she could be pregnant. Pregnancy is desired. Sexual Activity: single partner, contraception: none. Current symptoms also include: fatigue, nausea and positive home pregnancy test. Last period was abnormal.  Had 2 cycles in December.    Patient's last menstrual period was 04/23/2017 (lmp unknown). The following portions of the patient's history were reviewed and updated as appropriate: allergies, current medications, past family history, past medical history, past social history, past surgical history and problem list.  Review of Systems Pertinent items noted in HPI and remainder of comprehensive ROS otherwise negative.     Objective:    BP (!) 156/87   Pulse 92   Ht 5\' 7"  (1.702 m)   Wt 191 lb 4.8 oz (86.8 kg)   LMP 04/23/2017 (LMP Unknown)   BMI 29.96 kg/m  General: alert, no distress and no acute distress    Lab Review Urine HCG: positive    Assessment:    Irregular menstruation.    Positive pregnancy test H/o DM Type 1 Pre-existing HTN H/o pre-eclampsia in prior pregnancy  Plan:   - Pregnancy Test: Positive: EDC: unknown as patient with abnormal period in January (2 cycles). Briefly discussed pre-natal care options. Pregnancy, Childbirth and the Newborn book given. Encouraged well-balanced diet, plenty of rest when needed, pre-natal vitamins daily and walking for exercise. Discussed self-help for nausea, avoiding OTC medications until consulting provider or pharmacist, other than Tylenol as needed, minimal caffeine (1-2 cups daily) and avoiding alcohol. She will schedule her initial OB visit in the next month with her PCP or OB provider. Feel free to call with any questions.    - H/o Type I DM.  Patient currently being managed by Dr. Berkley Harvey in [place] (Endocrinologist). - Feb 4th trying to get authorization for  insulin pump. Was admitted to the hospital in December for hyperglycemia. Will also likely need f/u with [facility] during the pregnancy for high risk scans.  - Pre-existing HTN, patient had pre-eclampsia last pregnancy with residual high BPs postpartum.  Is still taking Metoprolol prescribed at that time.  Notes that she did not f/u with a PCP as recommended after postpartum visit.  Will change her Metoprolol to Labetalol. Started at 200 mg BID and will titrate accordingly. Will order baseline PIH labs at NOB intake.  Will also need to begin daily baby aspirin starting at [redacted] weeks gestation. Patient understands need for antenatal surveillance during high risk pregnancy.  - Patient with h/o pre-eclampsia in prior pregnancy, at risk for development again in this pregnancy. Will get baseline PIH labs and to start aspirin at 12 weeks.    A total of 15 minutes were spent face-to-face with the patient during this encounter and over half of that time dealt with counseling and coordination of care.  Rubie Maid, MD [facility]

## 2017-06-12 ENCOUNTER — Encounter: Payer: Self-pay | Admitting: Obstetrics and Gynecology

## 2017-06-12 DIAGNOSIS — O09291 Supervision of pregnancy with other poor reproductive or obstetric history, first trimester: Secondary | ICD-10-CM | POA: Insufficient documentation

## 2017-06-14 ENCOUNTER — Ambulatory Visit (INDEPENDENT_AMBULATORY_CARE_PROVIDER_SITE_OTHER): Payer: Medicaid Other

## 2017-06-14 DIAGNOSIS — Z3687 Encounter for antenatal screening for uncertain dates: Secondary | ICD-10-CM | POA: Diagnosis not present

## 2017-06-23 ENCOUNTER — Telehealth: Payer: Self-pay | Admitting: Obstetrics and Gynecology

## 2017-06-23 MED ORDER — ONDANSETRON 4 MG PO TBDP: 4.0000 mg | ORAL_TABLET | Freq: Four times a day (QID) | ORAL | 0 refills | Status: DC | PRN

## 2017-06-23 NOTE — Telephone Encounter (Signed)
Patient complains of nausea/vomiting for two days. She is unable to keep food or liquids down. She is currently approximately eight weeks pregnant. She also has a history of type one diabetes. Patient notes that her blood sugars have arranged with in the 200 to 300's which is normal for her. She did take her insulin today. I have called in a prescription for Zofran for her nausea and vomiting up early pregnancy, however if her vomiting does not resolve by in the morning with Zofran, patient was instructed that she needs to follow up in the hospital via the emergency room for further management and treatment as her nausea and vomiting may be related to her diabetes and not her pregnancy. Patient notes understanding.

## 2017-06-25 ENCOUNTER — Ambulatory Visit (INDEPENDENT_AMBULATORY_CARE_PROVIDER_SITE_OTHER): Payer: Medicaid Other | Admitting: Obstetrics and Gynecology

## 2017-06-25 VITALS — BP 139/87 | HR 102 | Ht 67.0 in | Wt 188.9 lb

## 2017-06-25 DIAGNOSIS — O24011 Pre-existing diabetes mellitus, type 1, in pregnancy, first trimester: Secondary | ICD-10-CM

## 2017-06-25 DIAGNOSIS — O10911 Unspecified pre-existing hypertension complicating pregnancy, first trimester: Secondary | ICD-10-CM

## 2017-06-25 DIAGNOSIS — O0991 Supervision of high risk pregnancy, unspecified, first trimester: Secondary | ICD-10-CM

## 2017-06-25 NOTE — Progress Notes (Signed)
Kelly Huff presents for NOB nurse interview visit. Pregnancy confirmation done here at [facility]. G- 7.  P-2    . Pregnancy education material explained and given. _No cats in the home. NOB labs ordered. , (sickle cell). HIV labs and Drug screen were explained optional and she did not decline. Drug screen ordered. PNV encouraged. Genetic screening options discussed.unsure.  Pt may discuss with provider. Pt. To follow up with provider in _3_ weeks for NOB physical.  All questions answered.

## 2017-06-25 NOTE — Patient Instructions (Signed)
First Trimester of Pregnancy The first trimester of pregnancy is from week 1 until the end of week 13 (months 1 through 3). During this time, your baby will begin to develop inside you. At 6-8 weeks, the eyes and face are formed, and the heartbeat can be seen on ultrasound. At the end of 12 weeks, all the baby's organs are formed. Prenatal care is all the medical care you receive before the birth of your baby. Make sure you get good prenatal care and follow all of your doctor's instructions. Follow these instructions at home: Medicines  Take over-the-counter and prescription medicines only as told by your doctor. Some medicines are safe and some medicines are not safe during pregnancy.  Take a prenatal vitamin that contains at least 600 micrograms (mcg) of folic acid.  If you have trouble pooping (constipation), take medicine that will make your stool soft (stool softener) if your doctor approves. Eating and drinking  Eat regular, healthy meals.  Your doctor will tell you the amount of weight gain that is right for you.  Avoid raw meat and uncooked cheese.  If you feel sick to your stomach (nauseous) or throw up (vomit): ? Eat 4 or 5 small meals a day instead of 3 large meals. ? Try eating a few soda crackers. ? Drink liquids between meals instead of during meals.  To prevent constipation: ? Eat foods that are high in fiber, like fresh fruits and vegetables, whole grains, and beans. ? Drink enough fluids to keep your pee (urine) clear or pale yellow. Activity  Exercise only as told by your doctor. Stop exercising if you have cramps or pain in your lower belly (abdomen) or low back.  Do not exercise if it is too hot, too humid, or if you are in a place of great height (high altitude).  Try to avoid standing for long periods of time. Move your legs often if you must stand in one place for a long time.  Avoid heavy lifting.  Wear low-heeled shoes. Sit and stand up straight.  You  can have sex unless your doctor tells you not to. Relieving pain and discomfort  Wear a good support bra if your breasts are sore.  Take warm water baths (sitz baths) to soothe pain or discomfort caused by hemorrhoids. Use hemorrhoid cream if your doctor says it is okay.  Rest with your legs raised if you have leg cramps or low back pain.  If you have puffy, bulging veins (varicose veins) in your legs: ? Wear support hose or compression stockings as told by your doctor. ? Raise (elevate) your feet for 15 minutes, 3-4 times a day. ? Limit salt in your food. Prenatal care  Schedule your prenatal visits by the twelfth week of pregnancy.  Write down your questions. Take them to your prenatal visits.  Keep all your prenatal visits as told by your doctor. This is important. Safety  Wear your seat belt at all times when driving.  Make a list of emergency phone numbers. The list should include numbers for family, friends, the hospital, and police and fire departments. General instructions  Ask your doctor for a referral to a local prenatal class. Begin classes no later than at the start of month 6 of your pregnancy.  Ask for help if you need counseling or if you need help with nutrition. Your doctor can give you advice or tell you where to go for help.  Do not use hot tubs, steam rooms, or   saunas.  Do not douche or use tampons or scented sanitary pads.  Do not cross your legs for long periods of time.  Avoid all herbs and alcohol. Avoid drugs that are not approved by your doctor.  Do not use any tobacco products, including cigarettes, chewing tobacco, and electronic cigarettes. If you need help quitting, ask your doctor. You may get counseling or other support to help you quit.  Avoid cat litter boxes and soil used by cats. These carry germs that can cause birth defects in the baby and can cause a loss of your baby (miscarriage) or stillbirth.  Visit your dentist. At home, brush  your teeth with a soft toothbrush. Be gentle when you floss. Contact a doctor if:  You are dizzy.  You have mild cramps or pressure in your lower belly.  You have a nagging pain in your belly area.  You continue to feel sick to your stomach, you throw up, or you have watery poop (diarrhea).  You have a bad smelling fluid coming from your vagina.  You have pain when you pee (urinate).  You have increased puffiness (swelling) in your face, hands, legs, or ankles. Get help right away if:  You have a fever.  You are leaking fluid from your vagina.  You have spotting or bleeding from your vagina.  You have very bad belly cramping or pain.  You gain or lose weight rapidly.  You throw up blood. It may look like coffee grounds.  You are around people who have German measles, fifth disease, or chickenpox.  You have a very bad headache.  You have shortness of breath.  You have any kind of trauma, such as from a fall or a car accident. Summary  The first trimester of pregnancy is from week 1 until the end of week 13 (months 1 through 3).  To take care of yourself and your unborn baby, you will need to eat healthy meals, take medicines only if your doctor tells you to do so, and do activities that are safe for you and your baby.  Keep all follow-up visits as told by your doctor. This is important as your doctor will have to ensure that your baby is healthy and growing well. This information is not intended to replace advice given to you by your health care provider. Make sure you discuss any questions you have with your health care provider. Document Released: 09/30/2007 Document Revised: 04/21/2016 Document Reviewed: 04/21/2016 Elsevier Interactive Patient Education  2017 [employer]  

## 2017-06-26 ENCOUNTER — Observation Stay
Admission: AD | Admit: 2017-06-26 | Discharge: 2017-06-26 | Disposition: A | Discharge status: Home / Self Care | Payer: Medicaid Other | Source: Ambulatory Visit | Attending: Obstetrics and Gynecology | Admitting: Obstetrics and Gynecology

## 2017-06-26 DIAGNOSIS — Z794 Long term (current) use of insulin: Secondary | ICD-10-CM | POA: Insufficient documentation

## 2017-06-26 DIAGNOSIS — D649 Anemia, unspecified: Secondary | ICD-10-CM | POA: Insufficient documentation

## 2017-06-26 DIAGNOSIS — O99011 Anemia complicating pregnancy, first trimester: Principal | ICD-10-CM | POA: Insufficient documentation

## 2017-06-26 DIAGNOSIS — Z3A08 8 weeks gestation of pregnancy: Secondary | ICD-10-CM | POA: Diagnosis not present

## 2017-06-26 DIAGNOSIS — Z79899 Other long term (current) drug therapy: Secondary | ICD-10-CM | POA: Insufficient documentation

## 2017-06-26 DIAGNOSIS — D509 Iron deficiency anemia, unspecified: Secondary | ICD-10-CM | POA: Insufficient documentation

## 2017-06-26 LAB — CBC
HCT: 17.6 % — ABNORMAL LOW (ref 35.0–47.0)
Hemoglobin: 5.9 g/dL — ABNORMAL LOW (ref 12.0–16.0)
MCH: 27.2 pg (ref 26.0–34.0)
MCHC: 33.2 g/dL (ref 32.0–36.0)
MCV: 81.9 fL (ref 80.0–100.0)
Platelets: 162 10*3/uL (ref 150–440)
RBC: 2.15 MIL/uL — ABNORMAL LOW (ref 3.80–5.20)
RDW: 15.7 % — ABNORMAL HIGH (ref 11.5–14.5)
WBC: 6.1 10*3/uL (ref 3.6–11.0)

## 2017-06-26 LAB — MICROSCOPIC EXAMINATION

## 2017-06-26 LAB — URINALYSIS, ROUTINE W REFLEX MICROSCOPIC
Bilirubin, UA: NEGATIVE
Ketones, UA: NEGATIVE
Nitrite, UA: NEGATIVE
Specific Gravity, UA: 1.021 (ref 1.005–1.030)
Urobilinogen, Ur: 1 mg/dL (ref 0.2–1.0)
pH, UA: 8 — ABNORMAL HIGH (ref 5.0–7.5)

## 2017-06-26 LAB — PREPARE RBC (CROSSMATCH)

## 2017-06-26 LAB — ABO/RH: ABO/RH(D): B POS

## 2017-06-26 LAB — GLUCOSE, CAPILLARY: Glucose-Capillary: 157 mg/dL — ABNORMAL HIGH (ref 65–99)

## 2017-06-26 MED ORDER — SODIUM CHLORIDE 0.9 % IV SOLN
INTRAVENOUS | Status: DC
Start: ? — End: 2017-06-26

## 2017-06-26 MED ORDER — SODIUM CHLORIDE 0.9 % IV SOLN
Freq: Once | INTRAVENOUS | Status: AC
  Administered 2017-06-26: 17:00:00 via INTRAVENOUS

## 2017-06-26 MED ORDER — SODIUM CHLORIDE 0.9 % IV SOLN: Freq: Once | INTRAVENOUS | Status: DC

## 2017-06-26 MED ORDER — DIPHENHYDRAMINE HCL 25 MG PO CAPS
25.0000 mg | ORAL_CAPSULE | Freq: Once | ORAL | Status: AC
  Administered 2017-06-26: 25 mg via ORAL
  Filled 2017-06-26: qty 1

## 2017-06-26 MED ORDER — ACETAMINOPHEN 325 MG PO TABS
650.0000 mg | ORAL_TABLET | ORAL | Status: DC | PRN
  Administered 2017-06-26: 650 mg via ORAL
  Filled 2017-06-26 (×2): qty 2

## 2017-06-26 NOTE — Discharge Summary (Signed)
L&D OB Triage Note  SUBJECTIVE Kelly Huff is a 32 y.o. [id number] female at [redacted]w[redacted]d, [place] Estimated Date of Delivery: 02/04/18 who presented to triage with complaints of anemia.  She had presented to [facility] yesterday with complaint of feeling tired often short of breath and occasionally lightheaded.  At that time she was diagnosed with anemia and was recommended that she return for blood transfusion.  They also performed somewhat of a workup regarding  "blood studies ".  When she returned today for her transfusion they suggested that she go to a hospital where obstetric care was delivered and refused to give her the transfusion.  She presented to [facility]  At the time of presentation she had a headache but otherwise had no other symptoms other than feeling generally tired.  An H&H was obtained and found to be 5.9/17.6  Obstetric History   G7   P2   T2   P0   A4   L2    SAB0   TAB4   Ectopic0   Multiple0   Live Births2     # Outcome Date GA Lbr Len/2nd Weight Sex Delivery Anes PTL Lv  7 Current           6 Term 06/2014 [redacted]w[redacted]d   M Vag-Spont   LIV     Complications: Mild pre-eclampsia  5 TAB           4 TAB           3 TAB           2 TAB           1 Term     M Vag-Spont   LIV      Medications Prior to Admission  Medication Sig Dispense Refill Last Dose  . isosorbide-hydrALAZINE (BIDIL) 20-37.5 MG tablet Take 1 tablet by mouth 2 (two) times daily. (Patient not taking: Reported on 06/10/2017) 60 tablet 0 Not Taking  . labetalol (NORMODYNE) 200 MG tablet Take 1 tablet (200 mg total) by mouth 2 (two) times daily. 60 tablet 3 Taking  . LANTUS SOLOSTAR 100 UNIT/ML Solostar Pen Inject 20 Units into the skin at bedtime. 15 mL 0 Taking  . metoprolol tartrate 75 MG TABS Take 75 mg by mouth 2 (two) times daily. (Patient not taking: Reported on 06/25/2017) 60 tablet 0 Not Taking  . NOVOLOG FLEXPEN 100 UNIT/ML FlexPen 0-9 units injected 3 times daily with meals according to the following  scale: CBG 70-120 = 0 units CBG 121-150 = 1 unit CBG 151-200 = 2 units CBG 201-250 = 3 units CBG 251-300 = 5 units CBG 301-350 = 7 units CBG 351-400 = 9 units CBG >400 = call MD for advice 15 mL 0 Taking  . ondansetron (ZOFRAN ODT) 4 MG disintegrating tablet Take 1 tablet (4 mg total) by mouth every 6 (six) hours as needed for nausea. 20 tablet 0 Taking  . ranitidine (ZANTAC) 150 MG capsule Take 150 mg by mouth every evening.   Not Taking     OBJECTIVE  Nursing Evaluation:   BP (!) 145/80   Pulse 90   Temp 99.2 F (37.3 C) (Oral)   Resp 16   LMP 04/30/2017   SpO2 100%    Findings:   Symptomatic anemia-at this time unexplained.     Patient was given 1 unit of packed red cells  ASSESSMENT Impression:  1.  Pregnancy:  [id number] at [redacted]w[redacted]d , EDD Estimated Date of Delivery: 02/04/18 2.  Symptomatic  anemia  PLAN 1. Reassurance given 2. Discharge home with standard labor precautions given to return to L&D or call the office for problems. 3  Plan outpatient workup for anemia during pregnancy.  Consider hematology consult.  Consider iron infusions for IM iron as outpatient. 4.  Patient to return to [facility] on Tuesday. 5.  Patient to resume oral iron as previously prescribed but recommend 3 times daily with meals.

## 2017-06-26 NOTE — Progress Notes (Signed)
Pt. D/C to home with D/C instructions. V/O of F/U appointment and all other D/C instructions including S/S anemia. Pt. Denies C/O and is alert and oriented with aprop. Affect. Color good, skin w&d. No vaginal bleeding or C/O cramping at [redacted] weeks Gestation.

## 2017-06-27 LAB — BPAM RBC
Blood Product Expiration Date: 201903142359
ISSUE DATE / TIME: 201903021610
Unit Type and Rh: 7300

## 2017-06-27 LAB — TYPE AND SCREEN
ABO/RH(D): B POS
Antibody Screen: NEGATIVE
Unit division: 0

## 2017-06-27 LAB — ANTIBODY SCREEN: Antibody Screen: NEGATIVE

## 2017-06-27 LAB — CBC WITH DIFFERENTIAL/PLATELET
Basophils Absolute: 0 10*3/uL (ref 0.0–0.2)
Basos: 0 %
EOS (ABSOLUTE): 0.2 10*3/uL (ref 0.0–0.4)
Eos: 3 %
Hematocrit: 20.7 % — ABNORMAL LOW (ref 34.0–46.6)
Hemoglobin: 6.8 g/dL — CL (ref 11.1–15.9)
Immature Grans (Abs): 0 10*3/uL (ref 0.0–0.1)
Immature Granulocytes: 0 %
Lymphocytes Absolute: 1.3 10*3/uL (ref 0.7–3.1)
Lymphs: 18 %
MCH: 27.9 pg (ref 26.6–33.0)
MCHC: 32.9 g/dL (ref 31.5–35.7)
MCV: 85 fL (ref 79–97)
Monocytes Absolute: 0.4 10*3/uL (ref 0.1–0.9)
Monocytes: 6 %
Neutrophils Absolute: 5.1 10*3/uL (ref 1.4–7.0)
Neutrophils: 73 %
Platelets: 191 10*3/uL (ref 150–379)
RBC: 2.44 x10E6/uL — CL (ref 3.77–5.28)
RDW: 15.5 % — ABNORMAL HIGH (ref 12.3–15.4)
WBC: 7 10*3/uL (ref 3.4–10.8)

## 2017-06-27 LAB — URINE CULTURE

## 2017-06-27 LAB — ABO AND RH: Rh Factor: POSITIVE

## 2017-06-27 LAB — SICKLE CELL SCREEN: Sickle Cell Screen: NEGATIVE

## 2017-06-27 LAB — HIV ANTIBODY (ROUTINE TESTING W REFLEX): HIV Screen 4th Generation wRfx: NONREACTIVE

## 2017-06-27 LAB — RPR: RPR Ser Ql: NONREACTIVE

## 2017-06-27 LAB — RUBELLA SCREEN: Rubella Antibodies, IGG: 1.2 index (ref >0.99)

## 2017-06-27 LAB — HEPATITIS B SURFACE ANTIGEN: Hepatitis B Surface Ag: NEGATIVE

## 2017-06-27 LAB — VARICELLA ZOSTER ANTIBODY, IGG: Varicella zoster IgG: 686 index (ref >165)

## 2017-06-28 ENCOUNTER — Telehealth: Payer: Self-pay | Admitting: Obstetrics and Gynecology

## 2017-06-28 LAB — MONITOR DRUG PROFILE 14(MW)
Amphetamine Scrn, Ur: NEGATIVE ng/mL
BARBITURATE SCREEN URINE: NEGATIVE ng/mL
BENZODIAZEPINE SCREEN, URINE: NEGATIVE ng/mL
Buprenorphine, Urine: NEGATIVE ng/mL
CANNABINOIDS UR QL SCN: NEGATIVE ng/mL
Cocaine (Metab) Scrn, Ur: NEGATIVE ng/mL
Creatinine(Crt), U: 165.9 mg/dL (ref 20.0–300.0)
Fentanyl, Urine: NEGATIVE pg/mL
Meperidine Screen, Urine: NEGATIVE ng/mL
Methadone Screen, Urine: NEGATIVE ng/mL
OXYCODONE+OXYMORPHONE UR QL SCN: NEGATIVE ng/mL
Opiate Scrn, Ur: NEGATIVE ng/mL
Ph of Urine: 7.7 (ref 4.5–8.9)
Phencyclidine Qn, Ur: NEGATIVE ng/mL
Propoxyphene Scrn, Ur: NEGATIVE ng/mL
SPECIFIC GRAVITY: 1.016
Tramadol Screen, Urine: NEGATIVE ng/mL

## 2017-06-28 LAB — GC/CHLAMYDIA PROBE AMP
Chlamydia trachomatis, NAA: NEGATIVE
Neisseria gonorrhoeae by PCR: NEGATIVE

## 2017-06-28 NOTE — Telephone Encounter (Signed)
The patient called and stated that she would like to have Dr. Marcelline Mates or her nurse call her back as soon as possible. No other information was disclosed. Please advise.

## 2017-06-28 NOTE — Progress Notes (Signed)
I have reviewed the record and concur with patient management and plan.  Patient is high risk OB, with h/o Type 1 DM, chronic HTN, h/o pre-eclampsia in prior pregnancy. Will be followed by Endocrinologist, [facility], and [facility].

## 2017-06-29 ENCOUNTER — Encounter: Payer: Self-pay | Admitting: Obstetrics and Gynecology

## 2017-06-29 ENCOUNTER — Ambulatory Visit (INDEPENDENT_AMBULATORY_CARE_PROVIDER_SITE_OTHER): Payer: Medicaid Other | Admitting: Obstetrics and Gynecology

## 2017-06-29 ENCOUNTER — Other Ambulatory Visit: Payer: Self-pay

## 2017-06-29 VITALS — BP 154/79 | HR 87 | Wt 193.1 lb

## 2017-06-29 DIAGNOSIS — O10919 Unspecified pre-existing hypertension complicating pregnancy, unspecified trimester: Secondary | ICD-10-CM | POA: Diagnosis not present

## 2017-06-29 DIAGNOSIS — Z9289 Personal history of other medical treatment: Secondary | ICD-10-CM

## 2017-06-29 DIAGNOSIS — O09291 Supervision of pregnancy with other poor reproductive or obstetric history, first trimester: Secondary | ICD-10-CM

## 2017-06-29 DIAGNOSIS — Z1329 Encounter for screening for other suspected endocrine disorder: Secondary | ICD-10-CM

## 2017-06-29 DIAGNOSIS — O99011 Anemia complicating pregnancy, first trimester: Secondary | ICD-10-CM

## 2017-06-29 DIAGNOSIS — O0991 Supervision of high risk pregnancy, unspecified, first trimester: Secondary | ICD-10-CM

## 2017-06-29 DIAGNOSIS — R8271 Bacteriuria: Secondary | ICD-10-CM

## 2017-06-29 DIAGNOSIS — O24019 Pre-existing diabetes mellitus, type 1, in pregnancy, unspecified trimester: Secondary | ICD-10-CM | POA: Diagnosis not present

## 2017-06-29 DIAGNOSIS — O99891 Other specified diseases and conditions complicating pregnancy: Secondary | ICD-10-CM

## 2017-06-29 DIAGNOSIS — O9989 Other specified diseases and conditions complicating pregnancy, childbirth and the puerperium: Secondary | ICD-10-CM

## 2017-06-29 LAB — POCT URINALYSIS DIPSTICK
Bilirubin, UA: NEGATIVE
Glucose, UA: NEGATIVE
Ketones, UA: NEGATIVE
Leukocytes, UA: NEGATIVE
Nitrite, UA: NEGATIVE
Spec Grav, UA: 1.02 (ref 1.010–1.025)
Urobilinogen, UA: 0.2 E.U./dL
pH, UA: 6 (ref 5.0–8.0)

## 2017-06-29 MED ORDER — ONDANSETRON 4 MG PO TBDP: 4.0000 mg | ORAL_TABLET | Freq: Three times a day (TID) | ORAL | 2 refills | Status: DC | PRN

## 2017-06-29 MED ORDER — NIFEDIPINE ER OSMOTIC RELEASE 30 MG PO TB24: 30.0000 mg | ORAL_TABLET | Freq: Every day | ORAL | 4 refills | Status: DC

## 2017-06-29 MED ORDER — DOCUSATE SODIUM 100 MG PO CAPS: 100.0000 mg | ORAL_CAPSULE | Freq: Two times a day (BID) | ORAL | 2 refills | Status: DC | PRN

## 2017-06-29 MED ORDER — NITROFURANTOIN MONOHYD MACRO 100 MG PO CAPS: 100.0000 mg | ORAL_CAPSULE | Freq: Two times a day (BID) | ORAL | 0 refills | Status: DC

## 2017-06-29 NOTE — Progress Notes (Signed)
GYNECOLOGY PROGRESS NOTE  Subjective:    Patient ID: Kelly Huff, female    DOB: [birth year], 32 y.o.   MRN: [id number]  HPI  Patient is a 32 y.o. [id number] female with Type 1 DM, cHTN in pregnancy, currently at [redacted]w[redacted]d gestation who presents for f/u after recent hospitalization for symptomatic anemia.  Patient was noted to have increased work of breathing with climbing stairs at work on Friday, along with some mild dizziness and noting fatigue in the past few days prior.  She was seen at a hospital in [place], with Hgb of ~ 6.6 noted on labs.  She was told that she needed a blood transfusion and was instructed to go to the nearest hospital with OB care as they did not have one.  Patient came to [facility] and received 1 unit PRBCs.  She today notes feeling a little better.  Still noting some fatigue.  She has been taking her iron pill daily as previously prescribed.   Of note, she also notes that she needs a refill on her Zofran.  States that it has been helping her. Additionally, she is complaining of some mild constipation, and desires a stool softener.  Lastly she notes that the newly prescribed Labetalol for her Graylon Good is causing her to feel lethargic and has GI upset.  She wonders if there is something else she can take.   The following portions of the patient's history were reviewed and updated as appropriate: allergies, current medications, past family history, past medical history, past social history, past surgical history and problem list.  Review of Systems Pertinent items noted in HPI and remainder of comprehensive ROS otherwise negative.   Objective:   Blood pressure (!) 154/79, pulse 87, weight 193 lb 1.6 oz (87.6 kg), last menstrual period 04/30/2017. General appearance: alert and no distress Abdomen: soft, non-tender; bowel sounds normal; no masses,  no organomegaly Remainder of exam deferred.    Labs:  Results for orders placed or performed in visit on 06/29/17  POCT  urinalysis dipstick  Result Value Ref Range   Color, UA yellow    Clarity, UA clear    Glucose, UA neg    Bilirubin, UA neg    Ketones, UA neg    Spec Grav, UA 1.020 1.010 - 1.025   Blood, UA large    pH, UA 6.0 5.0 - 8.0   Protein, UA 2,000+    Urobilinogen, UA 0.2 0.2 or 1.0 E.U./dL   Nitrite, UA neg    Leukocytes, UA Negative Negative   Appearance yellow    Odor      CBC Latest Ref Rng & Units 06/26/2017 06/25/2017 05/04/2017  WBC 3.6 - 11.0 K/uL 6.1 7.0 7.9  Hemoglobin 12.0 - 16.0 g/dL 5.9(L) 6.8(LL) 9.2(L)  Hematocrit 35.0 - 47.0 % 17.6(L) 20.7(L) 29.6(L)  Platelets 150 - 440 K/uL 162 191 288    Assessment:   8 weeks pregnancy Symptomatic anemia s/p blood transfusion cHTN in pregnancy, with h/o pre-eclampsia in prior pregnancy Type 1 DM Proteinuria Asymptomatic bacteriuria Mild constipation   Plan:   - Symptomatic anemia of unknown cause. Patient with no prior history of anemia (except recently noted a Hgb of ~ 9 two months ago when hospitalized for diabetes). Possible causes include anemia of pregnancy, renal disease. S/p 1 unit of PRBCs over the weekend. Repeat H/H ordered today, along with iron studies and Cr to assess renal function. Referral placed to Hematology.  - cHTN in pregnancy, changed from labetalol to Procardia  due to intolerance of medication..  Baseline PIH labs performed, as patient also with h/o pre-eclampsia in prior pregnancy - Type 1 DM, patient still working with endocrinologist to get insuling pump.  Notes blood sugars are improving.  Have been in the high 100s.  Will also refer to [place] when appropriate for fetal sonograms.  - Significant proteinuria noted today.  Unsure if it is due to dehydration and lack of much PO intake with nausea/vomiting (although ketones negative), or renal dysfunction. Labs ordered.  - Patient called in prescription for Macrobid for asymptomatic bacteriuria noted on OB labs (E. Faecalis).  - Patient to return on Friday for  desired genetic testing in pregnancy.  Can also check BP at that time.  - Constipation, prescribed Colace twice daily.    A total of 20 minutes were spent face-to-face with the patient during this encounter and over half of that time involved counseling and coordination of care.    Rubie Maid, MD [facility]

## 2017-06-29 NOTE — Telephone Encounter (Signed)
Pt was called back and she has an appt this evening.

## 2017-06-29 NOTE — Progress Notes (Signed)
Pt is doing a little better since her blood tran.on Saturday June 26, 2017.

## 2017-06-30 ENCOUNTER — Other Ambulatory Visit: Payer: Self-pay | Admitting: Obstetrics and Gynecology

## 2017-06-30 DIAGNOSIS — N189 Chronic kidney disease, unspecified: Secondary | ICD-10-CM

## 2017-06-30 DIAGNOSIS — R7989 Other specified abnormal findings of blood chemistry: Secondary | ICD-10-CM

## 2017-06-30 DIAGNOSIS — O24011 Pre-existing diabetes mellitus, type 1, in pregnancy, first trimester: Secondary | ICD-10-CM

## 2017-06-30 DIAGNOSIS — O10911 Unspecified pre-existing hypertension complicating pregnancy, first trimester: Secondary | ICD-10-CM

## 2017-06-30 DIAGNOSIS — N289 Disorder of kidney and ureter, unspecified: Principal | ICD-10-CM

## 2017-06-30 DIAGNOSIS — D649 Anemia, unspecified: Secondary | ICD-10-CM

## 2017-06-30 LAB — COMPREHENSIVE METABOLIC PANEL
ALT: 8 IU/L (ref 0–32)
AST: 11 IU/L (ref 0–40)
Albumin/Globulin Ratio: 1 — ABNORMAL LOW (ref 1.2–2.2)
Albumin: 3.1 g/dL — ABNORMAL LOW (ref 3.5–5.5)
Alkaline Phosphatase: 102 IU/L (ref 39–117)
BUN/Creatinine Ratio: 13 (ref 9–23)
BUN: 28 mg/dL — ABNORMAL HIGH (ref 6–20)
Bilirubin Total: 0.3 mg/dL (ref 0.0–1.2)
CO2: 19 mmol/L — ABNORMAL LOW (ref 20–29)
Calcium: 8.3 mg/dL — ABNORMAL LOW (ref 8.7–10.2)
Chloride: 103 mmol/L (ref 96–106)
Creatinine, Ser: 2.19 mg/dL — ABNORMAL HIGH (ref 0.57–1.00)
GFR calc Af Amer: 33 mL/min/{1.73_m2} — ABNORMAL LOW (ref >59)
GFR calc non Af Amer: 29 mL/min/{1.73_m2} — ABNORMAL LOW (ref >59)
Globulin, Total: 3 g/dL (ref 1.5–4.5)
Glucose: 217 mg/dL — ABNORMAL HIGH (ref 65–99)
Potassium: 4.7 mmol/L (ref 3.5–5.2)
Sodium: 133 mmol/L — ABNORMAL LOW (ref 134–144)
Total Protein: 6.1 g/dL (ref 6.0–8.5)

## 2017-06-30 LAB — HEMOGLOBIN AND HEMATOCRIT, BLOOD
Hematocrit: 20.5 % — ABNORMAL LOW (ref 34.0–46.6)
Hemoglobin: 6.9 g/dL — CL (ref 11.1–15.9)

## 2017-06-30 LAB — IRON,TIBC AND FERRITIN PANEL
Ferritin: 477 ng/mL — ABNORMAL HIGH (ref 15–150)
Iron Saturation: 21 % (ref 15–55)
Iron: 48 ug/dL (ref 27–159)
Total Iron Binding Capacity: 225 ug/dL — ABNORMAL LOW (ref 250–450)
UIBC: 177 ug/dL (ref 131–425)

## 2017-06-30 LAB — HEMOGLOBIN A1C
Est. average glucose Bld gHb Est-mCnc: 140 mg/dL
Hgb A1c MFr Bld: 6.5 % — ABNORMAL HIGH (ref 4.8–5.6)

## 2017-06-30 LAB — TSH: TSH: 0.371 u[IU]/mL — ABNORMAL LOW (ref 0.450–4.500)

## 2017-06-30 LAB — PROTEIN / CREATININE RATIO, URINE
Creatinine, Urine: 131.7 mg/dL
Protein, Ur: 914.7 mg/dL
Protein/Creat Ratio: 6945 mg/g creat — ABNORMAL HIGH (ref 0–200)

## 2017-07-01 ENCOUNTER — Telehealth: Payer: Self-pay | Admitting: Obstetrics and Gynecology

## 2017-07-01 DIAGNOSIS — O24011 Pre-existing diabetes mellitus, type 1, in pregnancy, first trimester: Secondary | ICD-10-CM

## 2017-07-01 DIAGNOSIS — D649 Anemia, unspecified: Secondary | ICD-10-CM

## 2017-07-01 DIAGNOSIS — O10911 Unspecified pre-existing hypertension complicating pregnancy, first trimester: Secondary | ICD-10-CM

## 2017-07-01 DIAGNOSIS — N289 Disorder of kidney and ureter, unspecified: Secondary | ICD-10-CM

## 2017-07-01 DIAGNOSIS — O26831 Pregnancy related renal disease, first trimester: Secondary | ICD-10-CM

## 2017-07-01 NOTE — Telephone Encounter (Signed)
Patient called requesting to speak with Dr Marcelline Mates directly regarding renal failure and how it affects the baby. Thanks

## 2017-07-02 ENCOUNTER — Other Ambulatory Visit: Payer: Medicaid Other

## 2017-07-02 DIAGNOSIS — O09291 Supervision of pregnancy with other poor reproductive or obstetric history, first trimester: Secondary | ICD-10-CM

## 2017-07-02 DIAGNOSIS — R7989 Other specified abnormal findings of blood chemistry: Secondary | ICD-10-CM

## 2017-07-02 DIAGNOSIS — O10911 Unspecified pre-existing hypertension complicating pregnancy, first trimester: Secondary | ICD-10-CM

## 2017-07-02 NOTE — Telephone Encounter (Signed)
Contacted patient regarding her concerns of her elevated Creatinine.  Answered all questions regarding current kidney function and that her referral to Nephrology has been placed.  Patient wonders if she needs to terminate the pregnancy due to her current health status. Will refer to [facility] to also further weigh in on health status and if medical termination should be considered.   Rubie Maid, MD [facility]

## 2017-07-02 NOTE — Addendum Note (Signed)
Addended by: Edwyna Shell on: 07/02/2017 11:09 AM   Modules accepted: Orders

## 2017-07-03 ENCOUNTER — Encounter: Payer: Self-pay | Admitting: Emergency Medicine

## 2017-07-03 ENCOUNTER — Other Ambulatory Visit: Payer: Self-pay

## 2017-07-03 ENCOUNTER — Emergency Department
Admission: EM | Admit: 2017-07-03 | Discharge: 2017-07-03 | Disposition: A | Discharge status: Home / Self Care | Payer: Medicaid Other | Attending: Emergency Medicine | Admitting: Emergency Medicine

## 2017-07-03 ENCOUNTER — Emergency Department: Payer: Medicaid Other

## 2017-07-03 DIAGNOSIS — Z79899 Other long term (current) drug therapy: Secondary | ICD-10-CM | POA: Diagnosis not present

## 2017-07-03 DIAGNOSIS — O10011 Pre-existing essential hypertension complicating pregnancy, first trimester: Secondary | ICD-10-CM | POA: Insufficient documentation

## 2017-07-03 DIAGNOSIS — Z3A09 9 weeks gestation of pregnancy: Secondary | ICD-10-CM | POA: Insufficient documentation

## 2017-07-03 DIAGNOSIS — O161 Unspecified maternal hypertension, first trimester: Secondary | ICD-10-CM

## 2017-07-03 DIAGNOSIS — Z794 Long term (current) use of insulin: Secondary | ICD-10-CM | POA: Diagnosis not present

## 2017-07-03 DIAGNOSIS — R519 Headache, unspecified: Secondary | ICD-10-CM

## 2017-07-03 DIAGNOSIS — R42 Dizziness and giddiness: Secondary | ICD-10-CM | POA: Diagnosis not present

## 2017-07-03 DIAGNOSIS — E103219 Type 1 diabetes mellitus with mild nonproliferative diabetic retinopathy with macular edema, unspecified eye: Secondary | ICD-10-CM | POA: Diagnosis not present

## 2017-07-03 DIAGNOSIS — O9989 Other specified diseases and conditions complicating pregnancy, childbirth and the puerperium: Secondary | ICD-10-CM | POA: Insufficient documentation

## 2017-07-03 DIAGNOSIS — R51 Headache: Secondary | ICD-10-CM | POA: Diagnosis not present

## 2017-07-03 LAB — THYROID PANEL WITH TSH
Free Thyroxine Index: 2.6 (ref 1.2–4.9)
T3 Uptake Ratio: 20 % — ABNORMAL LOW (ref 24–39)
T4, Total: 13 ug/dL — ABNORMAL HIGH (ref 4.5–12.0)
TSH: 0.332 u[IU]/mL — ABNORMAL LOW (ref 0.450–4.500)

## 2017-07-03 LAB — URINALYSIS, COMPLETE (UACMP) WITH MICROSCOPIC
Bilirubin Urine: NEGATIVE
Glucose, UA: 50 mg/dL — AB
Hgb urine dipstick: NEGATIVE
Ketones, ur: NEGATIVE mg/dL
Leukocytes, UA: NEGATIVE
Nitrite: NEGATIVE
Protein, ur: >=300 mg/dL — AB
Specific Gravity, Urine: 1.008 (ref 1.005–1.030)
pH: 6 (ref 5.0–8.0)

## 2017-07-03 LAB — BASIC METABOLIC PANEL
Anion gap: 7 (ref 5–15)
BUN: 25 mg/dL — ABNORMAL HIGH (ref 6–20)
CO2: 20 mmol/L — ABNORMAL LOW (ref 22–32)
Calcium: 8.3 mg/dL — ABNORMAL LOW (ref 8.9–10.3)
Chloride: 105 mmol/L (ref 101–111)
Creatinine, Ser: 2.2 mg/dL — ABNORMAL HIGH (ref 0.44–1.00)
GFR calc Af Amer: 33 mL/min — ABNORMAL LOW (ref >60)
GFR calc non Af Amer: 28 mL/min — ABNORMAL LOW (ref >60)
Glucose, Bld: 150 mg/dL — ABNORMAL HIGH (ref 65–99)
Potassium: 4.2 mmol/L (ref 3.5–5.1)
Sodium: 132 mmol/L — ABNORMAL LOW (ref 135–145)

## 2017-07-03 LAB — GLUCOSE, CAPILLARY
Glucose-Capillary: 140 mg/dL — ABNORMAL HIGH (ref 65–99)
Glucose-Capillary: 94 mg/dL (ref 65–99)

## 2017-07-03 LAB — HEPATIC FUNCTION PANEL
ALT: 9 U/L — ABNORMAL LOW (ref 14–54)
AST: 20 U/L (ref 15–41)
Albumin: 2.9 g/dL — ABNORMAL LOW (ref 3.5–5.0)
Alkaline Phosphatase: 92 U/L (ref 38–126)
Bilirubin, Direct: <0.1 mg/dL — ABNORMAL LOW (ref 0.1–0.5)
Total Bilirubin: 0.7 mg/dL (ref 0.3–1.2)
Total Protein: 6.8 g/dL (ref 6.5–8.1)

## 2017-07-03 LAB — CBC
HCT: 20.7 % — ABNORMAL LOW (ref 35.0–47.0)
Hemoglobin: 7.2 g/dL — ABNORMAL LOW (ref 12.0–16.0)
MCH: 28.6 pg (ref 26.0–34.0)
MCHC: 34.6 g/dL (ref 32.0–36.0)
MCV: 82.9 fL (ref 80.0–100.0)
Platelets: 182 10*3/uL (ref 150–440)
RBC: 2.5 MIL/uL — ABNORMAL LOW (ref 3.80–5.20)
RDW: 15.6 % — ABNORMAL HIGH (ref 11.5–14.5)
WBC: 8 10*3/uL (ref 3.6–11.0)

## 2017-07-03 LAB — INFLUENZA PANEL BY PCR (TYPE A & B)
Influenza A By PCR: NEGATIVE
Influenza B By PCR: NEGATIVE

## 2017-07-03 LAB — URIC ACID: Uric Acid: 4.6 mg/dL (ref 2.5–7.1)

## 2017-07-03 MED ORDER — MECLIZINE HCL 25 MG PO TABS: 25.0000 mg | ORAL_TABLET | Freq: Three times a day (TID) | ORAL | 1 refills | Status: DC | PRN

## 2017-07-03 MED ORDER — NIFEDIPINE ER OSMOTIC RELEASE 30 MG PO TB24: 60.0000 mg | ORAL_TABLET | Freq: Every day | ORAL | 0 refills | Status: DC

## 2017-07-03 MED ORDER — SODIUM CHLORIDE 0.9 % IV BOLUS (SEPSIS)
1000.0000 mL | Freq: Once | INTRAVENOUS | Status: AC
  Administered 2017-07-03: 1000 mL via INTRAVENOUS

## 2017-07-03 MED ORDER — ACETAMINOPHEN 500 MG PO TABS
1000.0000 mg | ORAL_TABLET | Freq: Once | ORAL | Status: AC
  Administered 2017-07-03: 1000 mg via ORAL
  Filled 2017-07-03: qty 2

## 2017-07-03 MED ORDER — NIFEDIPINE ER 30 MG PO TB24
30.0000 mg | ORAL_TABLET | Freq: Once | ORAL | Status: AC
  Administered 2017-07-03: 30 mg via ORAL
  Filled 2017-07-03: qty 1

## 2017-07-03 MED ORDER — METOCLOPRAMIDE HCL 5 MG/ML IJ SOLN
10.0000 mg | Freq: Once | INTRAMUSCULAR | Status: AC
  Administered 2017-07-03: 10 mg via INTRAVENOUS
  Filled 2017-07-03: qty 2

## 2017-07-03 MED ORDER — MECLIZINE HCL 25 MG PO TABS
25.0000 mg | ORAL_TABLET | Freq: Once | ORAL | Status: AC
  Administered 2017-07-03: 25 mg via ORAL
  Filled 2017-07-03: qty 1

## 2017-07-03 NOTE — Progress Notes (Signed)
Case discussed with Dr. Alfred Levins regarding progressive worsening of renal function Urinalysis shows proteinuria We will obtain ANA, ANCA, complements, urine protein to creatinine ratio Our office will call the patient and her cell phone [phone] for outpatient evaluation.

## 2017-07-03 NOTE — ED Notes (Signed)
Attempted doppler = pt is [redacted] weeks pregnant and unable to obtain fetal heart tones. Pt aware and will try.

## 2017-07-03 NOTE — ED Provider Notes (Signed)
[facility] Emergency Department Provider Note  ____________________________________________  Time seen: Approximately 3:25 PM  I have reviewed the triage vital signs and the nursing notes.   HISTORY  Chief Complaint Headache   HPI Kelly Huff is a 32 y.o. female  [phone] female with Type 1 DM and chronic HTN currently at [redacted] weeks GA who presents for evaluation of HA. Patient reports that she has been having daily HAs for several weeks. She has recently been diagnosed with with elevated creatinine in 03/2017 and anemia. Received a blood transfusion one week ago. Currently on nifedipine for BP control. Patient reports that yesterday while she was at work the headache was severe and associated with vertigo. She thought her BG was elevated and checked but it was 68. She felt better after she ate. She had pre-eclampsia with prior pregnancy. The headache is located on the right side of her head, sharp, constant for most days for several weeks, currently very mild. No changes in visual or visual spots, no fever or chills, no URI symptoms, no diarrhea, no chest pain or shortness of breath, no abdominal pain, no vaginal bleeding. She endorses compliance with her medications. Has been having NBNB emesis this week, total of 3- 4 episodes. She thinks she is dehydrated. She is worried about her kidney function. She has been referred to Nephrology for further evaluation by her ObGYN.  Past Medical History:  Diagnosis Date  . Diabetes mellitus without complication ([place])   . History of pre-eclampsia 2016   mild  . Hypertension     Patient Active Problem List   Diagnosis Date Noted  . History of pre-eclampsia in prior pregnancy, currently pregnant in first trimester 06/12/2017  . Diabetes ([place]) 04/13/2017  . Acute renal failure ([place])   . Dehydration   . Obesity (BMI 30.0-34.9) 01/21/2017  . Other nonspecific abnormal finding 01/29/2016  . Family history of BRCA gene  positive 07/13/2013  . 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 DILATION AND CURETTAGE OF UTERUS     x 4    Prior to Admission medications   Medication Sig Start Date End Date Taking? Authorizing Provider  docusate sodium (COLACE) 100 MG capsule Take 1 capsule (100 mg total) by mouth 2 (two) times daily as needed. 06/29/17   Rubie Maid, MD  ferrous sulfate 325 (65 FE) MG tablet Take 325 mg by mouth 3 (three) times daily.    [provider]  LANTUS SOLOSTAR 100 UNIT/ML Solostar Pen Inject 20 Units into the skin at bedtime. 04/16/17   Cherene Altes, MD  meclizine (ANTIVERT) 25 MG tablet Take 1 tablet (25 mg total) by mouth 3 (three) times daily as needed. 07/03/17   Rudene Re, MD  NIFEdipine (PROCARDIA-XL/ADALAT-CC/NIFEDICAL-XL) 30 MG 24 hr tablet Take 2 tablets (60 mg total) by mouth daily. 07/03/17   Rudene Re, MD  nitrofurantoin, macrocrystal-monohydrate, (MACROBID) 100 MG capsule Take 1 capsule (100 mg total) by mouth 2 (two) times daily. 06/29/17   Rubie Maid, MD  NOVOLOG FLEXPEN 100 UNIT/ML FlexPen 0-9 units injected 3 times daily with meals according to the following scale: CBG 70-120 = 0 units CBG 121-150 = 1 unit CBG 151-200 = 2 units CBG 201-250 = 3 units CBG 251-300 = 5 units CBG 301-350 = 7 units CBG 351-400 = 9 units CBG >400 = call MD for advice 04/16/17   Cherene Altes,  MD  ondansetron (ZOFRAN ODT) 4 MG disintegrating tablet Take 1 tablet (4 mg total) by mouth every 8 (eight) hours as needed for nausea. 06/29/17   Rubie Maid, MD  Prenatal Vit-Fe Fumarate-FA (MULTIVITAMIN-PRENATAL) 27-0.8 MG TABS tablet Take 1 tablet by mouth daily at 12 noon.    [provider]    Allergies Morphine and Sulfamethoxazole-trimethoprim  Family History  Problem Relation Age of Onset  . Hypertension Mother   . Cancer Mother        Uterine vs  cervical (pt unsure),   . Schizophrenia Brother     Social History Social History   Tobacco Use  . Smoking status: Never Smoker  . Smokeless tobacco: Never Used  Substance Use Topics  . Alcohol use: No  . Drug use: No    Review of Systems  Constitutional: Negative for fever. Eyes: Negative for visual changes. ENT: Negative for sore throat. Neck: No neck pain  Cardiovascular: Negative for chest pain. Respiratory: Negative for shortness of breath. Gastrointestinal: Negative for abdominal pain or diarrhea. + vomiting Genitourinary: Negative for dysuria. Musculoskeletal: Negative for back pain. Skin: Negative for rash. Neurological: Negative for  weakness or numbness. + HA Psych: No SI or HI  ____________________________________________   PHYSICAL EXAM:  VITAL SIGNS: ED Triage Vitals  Enc Vitals Group     BP 07/03/17 1242 (!) 142/82     Pulse Rate 07/03/17 1242 99     Resp 07/03/17 1242 16     Temp 07/03/17 1242 99.1 F (37.3 C)     Temp Source 07/03/17 1242 Oral     SpO2 07/03/17 1242 100 %     Weight 07/03/17 1243 193 lb (87.5 kg)     Height 07/03/17 1243 _0  (1.702 m)     Head Circumference --      Peak Flow --      Pain Score 07/03/17 1243 3     Pain Loc --      Pain Edu? --      Excl. in [place]? --     Constitutional: Alert and oriented. Well appearing and in no apparent distress. HEENT:      Head: Normocephalic and atraumatic.         Eyes: Conjunctivae are normal. Sclera is non-icteric.       Mouth/Throat: Mucous membranes are moist.       Neck: Supple with no signs of meningismus. Cardiovascular: Regular rate and rhythm. No murmurs, gallops, or rubs. 2+ symmetrical distal pulses are present in all extremities. No JVD. Respiratory: Normal respiratory effort. Lungs are clear to auscultation bilaterally. No wheezes, crackles, or rhonchi.  Gastrointestinal: Soft, non tender, and non distended with positive bowel sounds. No rebound or  guarding. Musculoskeletal: Nontender with normal range of motion in all extremities. No edema, cyanosis, or erythema of extremities. Neurologic: Normal speech and language. A & O x3, PERRL, EOMI, no nystagmus, CN II-XII intact, motor testing reveals good tone and bulk throughout. There is no evidence of pronator drift or dysmetria. Muscle strength is 5/5 throughout. Sensory examination is intact. Gait is normal. Skin: Skin is warm, dry and intact. No rash noted. Psychiatric: Mood and affect are normal. Speech and behavior are normal.  ____________________________________________   LABS (all labs ordered are listed, but only abnormal results are displayed)  Labs Reviewed  BASIC METABOLIC PANEL - Abnormal; Notable for the following components:      Result Value   Sodium 132 (*)    CO2 20 (*)  Glucose, Bld 150 (*)    BUN 25 (*)    Creatinine, Ser 2.20 (*)    Calcium 8.3 (*)    GFR calc non Af Amer 28 (*)    GFR calc Af Amer 33 (*)    All other components within normal limits  CBC - Abnormal; Notable for the following components:   RBC 2.50 (*)    Hemoglobin 7.2 (*)    HCT 20.7 (*)    RDW 15.6 (*)    All other components within normal limits  URINALYSIS, COMPLETE (UACMP) WITH MICROSCOPIC - Abnormal; Notable for the following components:   Color, Urine YELLOW (*)    APPearance HAZY (*)    Glucose, UA 50 (*)    Protein, ur >=300 (*)    Bacteria, UA RARE (*)    Squamous Epithelial / LPF 0-5 (*)    All other components within normal limits  GLUCOSE, CAPILLARY - Abnormal; Notable for the following components:   Glucose-Capillary 140 (*)    All other components within normal limits  HEPATIC FUNCTION PANEL - Abnormal; Notable for the following components:   Albumin 2.9 (*)    ALT 9 (*)    Bilirubin, Direct <0.1 (*)    All other components within normal limits  INFLUENZA PANEL BY PCR (TYPE A & B)  GLUCOSE, CAPILLARY  SICKLE CELL SCREEN  PROTEIN / CREATININE RATIO, URINE  C3  COMPLEMENT  C4 COMPLEMENT  ANCA TITERS  ANA W/REFLEX IF POSITIVE  CBG MONITORING, ED   ____________________________________________  EKG  ED ECG REPORT I, Rudene Re, the attending physician, personally viewed and interpreted this ECG.  Normal sinus rhythm, rate of 99, normal intervals, normal axis, no ST elevations or depressions. Normal EKG. ____________________________________________  RADIOLOGY  I have personally reviewed the images performed during this visit and I agree with the Radiologist's read.   Interpretation by Radiologist:  Mr Brain Wo Contrast  Result Date: 07/03/2017 CLINICAL DATA:  Headache with dizziness and weakness. Nine weeks pregnant. Type 1 diabetes. Renal failure. EXAM: MRI HEAD WITHOUT CONTRAST MRV HEAD WITHOUT CONTRAST TECHNIQUE: Multiplanar, multiecho pulse sequences of the brain and surrounding structures were obtained without intravenous contrast. Angiographic images of the intracranial venous structures were obtained using MRV technique without intravenous contrast. COMPARISON:  None. FINDINGS: Brain MRI: Brain: No evidence of infarct, hemorrhage, hydrocephalus, collection, or mass. No white matter disease. Vascular: Major flow voids are preserved. Skull and cervical spine: Normal marrow signal. Orbits and sinuses: Negative.  No sinusitis to explain headache. Other: Large bilateral cervical lymph nodes that are nonspecific. MRV: No dural venous sinus thrombosis. The right transverse and sigmoid system is dominant. The deep veins are symmetrically visualized. No evidence of cortical vein thrombosis. IMPRESSION: 1. Normal brain MRI and MRV.  No dural venous sinus thrombosis. 2. Enlarged bilateral cervical nodes. Please correlate with neck exam and for URI symptoms. Electronically Signed   By: Monte Fantasia M.D.   On: 07/03/2017 20:24   Mr Annell Greening Head  Result Date: 07/03/2017 CLINICAL DATA:  Headache with dizziness and weakness. Nine weeks pregnant.  Type 1 diabetes. Renal failure. EXAM: MRI HEAD WITHOUT CONTRAST MRV HEAD WITHOUT CONTRAST TECHNIQUE: Multiplanar, multiecho pulse sequences of the brain and surrounding structures were obtained without intravenous contrast. Angiographic images of the intracranial venous structures were obtained using MRV technique without intravenous contrast. COMPARISON:  None. FINDINGS: Brain MRI: Brain: No evidence of infarct, hemorrhage, hydrocephalus, collection, or mass. No white matter disease. Vascular: Major flow voids are preserved.  Skull and cervical spine: Normal marrow signal. Orbits and sinuses: Negative.  No sinusitis to explain headache. Other: Large bilateral cervical lymph nodes that are nonspecific. MRV: No dural venous sinus thrombosis. The right transverse and sigmoid system is dominant. The deep veins are symmetrically visualized. No evidence of cortical vein thrombosis. IMPRESSION: 1. Normal brain MRI and MRV.  No dural venous sinus thrombosis. 2. Enlarged bilateral cervical nodes. Please correlate with neck exam and for URI symptoms. Electronically Signed   By: Monte Fantasia M.D.   On: 07/03/2017 20:24     ____________________________________________   PROCEDURES  Procedure(s) performed: None Procedures Critical Care performed:  None ____________________________________________   INITIAL IMPRESSION / ASSESSMENT AND PLAN / ED COURSE  32 y.o. female  [phone] female with Type 1 DM and chronic HTN currently at [redacted] weeks GA who presents for evaluation of daily HAs for several weeks. Had more severe episode yesterday associated with vertigo. Patient was worried that she was dehydrated or her anemia was worse which prompted her visit to the ER. Patient is well-appearing, neurologically intact, she is hypertensive with systolics up to 785Y, remaining of her physical exam is within normal limits. We'll increase her dose of nifedipine form 30 daily to 60. We'll give her an extra dose here in the  emergency room.  Labs showing a stable anemia since her transfusion with a hemoglobin of 7.2 (6.9 after transfusion on 3/2). Creatinine has been between 1.7-2.19, currently 2.20, will give IVF. HA is now mild, will give tylenol and reglan. Ddx HA from elevated BP vs migraine, no clinical signs of meningitis, SAH. Neuro intact, no changes in vision, no pain with EOMI, currently mild HA therefore low suspicion for CVT at this time. Will consult obgyn.  _________________________ 3:58 PM on 07/03/2017 -----------------------------------------  Spoke with patient's obgyn, Dr. Marcelline Mates who agrees with plan of increasing nifedipine, hydration, and if patient remains well appearing dc with close outpatient f/u Monday at the office. She requested that I consult nephrology to try to expedite patient getting in for evaluation of new kidney disease. I spoke with Dr. Candiss Norse who requested additional labs and will see patient in the office on Monday.     Clinical Course as of Jul 03 2140  Sat Jul 03, 2017  1733 Patient continues to complain of HA. Slightly orthostatic. 2nd bolus ordered. Will pursue MRI/MRV to rule out CVT.   [CV]    Clinical Course User Index [CV] Alfred Levins, [place], MD     _________________________ 9:43 PM on 07/03/2017 -----------------------------------------  MRI MRV and negative for any acute findings. Patient's vertigo is better with meclizine. HA has resolved. BP improved. We'll provide a prescription. Patient's nifedipine dose was increased per recommendation of OB/GYN. All labs recommended by nephrologist are pending at this time. Patient to follow up with OB/GYN and nephrology on Monday. POCUS showing IUP with FHR 162. Discussed return precautions with patient.  As part of my medical decision making, I reviewed the following data within the [place] notes reviewed and incorporated, Labs reviewed , EKG interpreted , Radiograph reviewed , A consult was  requested and obtained from this/these consultant(s) Obgyn and nephrology, Notes from prior ED visits and [place] Controlled Substance Database    Pertinent labs & imaging results that were available during my care of the patient were reviewed by me and considered in my medical decision making (see chart for details).    ____________________________________________   FINAL CLINICAL IMPRESSION(S) / ED DIAGNOSES  Final diagnoses:  Acute nonintractable headache, unspecified headache type  Vertigo  Elevated blood pressure affecting pregnancy in first trimester, antepartum      NEW MEDICATIONS STARTED DURING THIS VISIT:  ED Discharge Orders        Ordered    meclizine (ANTIVERT) 25 MG tablet  3 times daily PRN     07/03/17 2140    NIFEdipine (PROCARDIA-XL/ADALAT-CC/NIFEDICAL-XL) 30 MG 24 hr tablet  Daily     07/03/17 2140       Note:  This document was prepared using Dragon voice recognition software and may include unintentional dictation errors.    Rudene Re, MD 07/03/17 2328

## 2017-07-03 NOTE — ED Triage Notes (Addendum)
Pt to ed with c/o dizziness/weakness and headache that started last night.  Pt is approx [redacted] weeks pregnant. Denies vaginal bleeding, denies abd pain.

## 2017-07-03 NOTE — Discharge Instructions (Signed)

## 2017-07-03 NOTE — ED Notes (Signed)
CBG 94 

## 2017-07-05 ENCOUNTER — Encounter: Payer: Medicaid Other | Admitting: Oncology

## 2017-07-05 ENCOUNTER — Other Ambulatory Visit: Payer: Self-pay | Admitting: Internal Medicine

## 2017-07-05 DIAGNOSIS — R944 Abnormal results of kidney function studies: Secondary | ICD-10-CM

## 2017-07-05 DIAGNOSIS — I10 Essential (primary) hypertension: Secondary | ICD-10-CM

## 2017-07-05 LAB — C4 COMPLEMENT: Complement C4, Body Fluid: 41 mg/dL (ref 14–44)

## 2017-07-05 LAB — C3 COMPLEMENT: C3 Complement: 148 mg/dL (ref 82–167)

## 2017-07-06 ENCOUNTER — Inpatient Hospital Stay: Payer: Medicaid Other

## 2017-07-06 ENCOUNTER — Inpatient Hospital Stay: Payer: Medicaid Other | Attending: Oncology | Admitting: Internal Medicine

## 2017-07-06 ENCOUNTER — Other Ambulatory Visit: Payer: Self-pay

## 2017-07-06 DIAGNOSIS — D649 Anemia, unspecified: Secondary | ICD-10-CM | POA: Insufficient documentation

## 2017-07-06 DIAGNOSIS — E119 Type 2 diabetes mellitus without complications: Secondary | ICD-10-CM | POA: Diagnosis not present

## 2017-07-06 DIAGNOSIS — O99011 Anemia complicating pregnancy, first trimester: Secondary | ICD-10-CM | POA: Diagnosis present

## 2017-07-06 DIAGNOSIS — D509 Iron deficiency anemia, unspecified: Secondary | ICD-10-CM | POA: Insufficient documentation

## 2017-07-06 DIAGNOSIS — O99019 Anemia complicating pregnancy, unspecified trimester: Secondary | ICD-10-CM | POA: Insufficient documentation

## 2017-07-06 LAB — LACTATE DEHYDROGENASE: LDH: 372 U/L — ABNORMAL HIGH (ref 98–192)

## 2017-07-06 LAB — RETICULOCYTES
RBC.: 3.66 MIL/uL — ABNORMAL LOW (ref 3.80–5.20)
Retic Count, Absolute: 179.3 10*3/uL (ref 19.0–183.0)
Retic Ct Pct: 4.9 % — ABNORMAL HIGH (ref 0.4–3.1)

## 2017-07-06 LAB — SAMPLE TO BLOOD BANK

## 2017-07-06 LAB — IRON AND TIBC
Iron: 55 ug/dL (ref 28–170)
Saturation Ratios: 21 % (ref 10.4–31.8)
TIBC: 263 ug/dL (ref 250–450)
UIBC: 208 ug/dL

## 2017-07-06 LAB — CBC WITH DIFFERENTIAL/PLATELET
Basophils Absolute: 0 10*3/uL (ref 0–0.1)
Basophils Relative: 1 %
Eosinophils Absolute: 0.1 10*3/uL (ref 0–0.7)
Eosinophils Relative: 3 %
HCT: 19.6 % — ABNORMAL LOW (ref 35.0–47.0)
Hemoglobin: 6.8 g/dL — ABNORMAL LOW (ref 12.0–16.0)
Lymphocytes Relative: 20 %
Lymphs Abs: 1.1 10*3/uL (ref 1.0–3.6)
MCH: 28.5 pg (ref 26.0–34.0)
MCHC: 34.6 g/dL (ref 32.0–36.0)
MCV: 82.5 fL (ref 80.0–100.0)
Monocytes Absolute: 0.3 10*3/uL (ref 0.2–0.9)
Monocytes Relative: 5 %
Neutro Abs: 4.2 10*3/uL (ref 1.4–6.5)
Neutrophils Relative %: 71 %
Platelets: 178 10*3/uL (ref 150–440)
RBC: 2.38 MIL/uL — ABNORMAL LOW (ref 3.80–5.20)
RDW: 15.7 % — ABNORMAL HIGH (ref 11.5–14.5)
WBC: 5.8 10*3/uL (ref 3.6–11.0)

## 2017-07-06 LAB — FOLATE: Folate: 47.9 ng/mL (ref >5.9)

## 2017-07-06 LAB — FERRITIN: Ferritin: 447 ng/mL — ABNORMAL HIGH (ref 11–307)

## 2017-07-06 LAB — ANCA TITERS
Atypical P-ANCA titer: <1:20 {titer}
C-ANCA: <1:20 {titer}
P-ANCA: <1:20 {titer}

## 2017-07-06 LAB — ANA W/REFLEX IF POSITIVE: Anti Nuclear Antibody(ANA): NEGATIVE

## 2017-07-06 LAB — SICKLE CELL SCREEN: Sickle Cell Screen: NEGATIVE

## 2017-07-06 LAB — VITAMIN B12: Vitamin B-12: 376 pg/mL (ref 180–914)

## 2017-07-06 MED ORDER — FERROUS FUM-IRON POLYSACCH 162-115.2 MG PO CAPS: 1.0000 | ORAL_CAPSULE | Freq: Every day | ORAL | 6 refills | Status: DC

## 2017-07-06 NOTE — Progress Notes (Signed)
[place] NOTE  Patient Care Team: Lin Landsman, MD as PCP - General (Family Medicine)  CHIEF COMPLAINTS/PURPOSE OF CONSULTATION: Anemia  # Severe anemia [nadir 5.8- Feb 2019]  # March 2019- [redacted] weeks Pregnant [Dr.Cherry/Ob]  # AKI/CKD [name] 2018; Dr.Singh]; IDDM [since age of 71- Dr.Kerr,/Endo]    No history exists.     HISTORY OF PRESENTING ILLNESS:  Kelly Huff 32 y.o.  female IDDM long-standing history with prior poor control-has been referred to [place] for further evaluation recommendations for worsening anemia; of note patient is in her trimester pregnancy 10 weeks.  Patient states that she was admitted to the hospital in December 2018 with nausea vomiting/acute renal failure-noted to have elevated blood sugars.  Patient states this was likely from [place].   Of note patient noted to have worsening hemoglobin over the last few months-approximately 9-10; however more recently hemoglobin has been dropped to a nadir of 5.8 when she received PRBC blood transfusion.  Patient is symptomatic-with exertional dyspnea.   Patient denies any history of vaginal bleeding.  Denies any history of any blood per rectum.  Denies any black colored stools.  Patient also denies any bleeding in urine.  Patient states that she did not have any issues with anemia in the past; and also did not need any transfusions with prior pregnancies.  Patient denies any history of blood clots.  Denies abdominal pain.   ROS: A complete 10 point review of system is done which is negative except mentioned above in history of present illness  MEDICAL HISTORY:  Past Medical History:  Diagnosis Date  . Diabetes mellitus without complication ([place])   . History of pre-eclampsia 2016   mild  . Hypertension     SURGICAL HISTORY: Past Surgical History:  Procedure Laterality Date  . DILATION AND CURETTAGE OF UTERUS     x 4    SOCIAL HISTORY: Social History   Socioeconomic History  .  Marital status: Single    Spouse name: Not on file  . Number of children: Not on file  . Years of education: Not on file  . Highest education level: Not on file  Social Needs  . Financial resource strain: Not on file  . Food insecurity - worry: Not on file  . Food insecurity - inability: Not on file  . Transportation needs - medical: Not on file  . Transportation needs - non-medical: Not on file  Occupational History  . Not on file  Tobacco Use  . Smoking status: Never Smoker  . Smokeless tobacco: Never Used  Substance and Sexual Activity  . Alcohol use: No  . Drug use: No  . Sexual activity: Yes    Birth control/protection: None  Other Topics Concern  . Not on file  Social History Narrative  . Not on file    FAMILY HISTORY: Family History  Problem Relation Age of Onset  . Hypertension Mother   . Cancer Mother        Uterine vs cervical (pt unsure),   . Schizophrenia Brother     ALLERGIES:  is allergic to morphine and sulfamethoxazole-trimethoprim.  MEDICATIONS:  Current Outpatient Medications  Medication Sig Dispense Refill  . docusate sodium (COLACE) 100 MG capsule Take 1 capsule (100 mg total) by mouth 2 (two) times daily as needed. 30 capsule 2  . LANTUS SOLOSTAR 100 UNIT/ML Solostar Pen Inject 20 Units into the skin at bedtime. 15 mL 0  . meclizine (ANTIVERT) 25 MG tablet Take 1 tablet (  25 mg total) by mouth 3 (three) times daily as needed. 30 tablet 1  . NIFEdipine (PROCARDIA-XL/ADALAT-CC/NIFEDICAL-XL) 30 MG 24 hr tablet Take 2 tablets (60 mg total) by mouth daily. 30 tablet 0  . NOVOLOG FLEXPEN 100 UNIT/ML FlexPen 0-9 units injected 3 times daily with meals according to the following scale: CBG 70-120 = 0 units CBG 121-150 = 1 unit CBG 151-200 = 2 units CBG 201-250 = 3 units CBG 251-300 = 5 units CBG 301-350 = 7 units CBG 351-400 = 9 units CBG >400 = call MD for advice 15 mL 0  . ondansetron (ZOFRAN ODT) 4 MG disintegrating tablet Take 1 tablet (4 mg  total) by mouth every 8 (eight) hours as needed for nausea. 30 tablet 2  . Prenatal Vit-Fe Fumarate-FA (MULTIVITAMIN-PRENATAL) 27-0.8 MG TABS tablet Take 1 tablet by mouth daily at 12 noon.    . ferrous fumarate-iron polysaccharide complex (TANDEM) 162-115.2 MG CAPS capsule Take 1 capsule by mouth daily with breakfast. 30 capsule 6  . ferrous sulfate 325 (65 FE) MG tablet Take 325 mg by mouth 3 (three) times daily.     No current facility-administered medications for this visit.       Marland Kitchen  PHYSICAL EXAMINATION: ECOG PERFORMANCE STATUS: 1 - Symptomatic but completely ambulatory  Vitals:   07/06/17 1055 07/06/17 1100  BP: (!) 156/108 (!) 153/99  Pulse:  90  Resp: 20 20  Temp:  97.6 F (36.4 C)   Filed Weights   07/06/17 1055  Weight: 205 lb 0.4 oz (93 kg)    GENERAL: Well-nourished well-developed; Alert, no distress and comfortable.   Alone.  EYES: positive for pallor; No icterus OROPHARYNX: no thrush or ulceration; good dentition  NECK: supple, no masses felt LYMPH:  no palpable lymphadenopathy in the cervical, axillary or inguinal regions LUNGS: clear to auscultation and  No wheeze or crackles HEART/CVS: regular rate & rhythm and no murmurs; No lower extremity edema ABDOMEN: abdomen soft, non-tender and normal bowel sounds Musculoskeletal:no cyanosis of digits and no clubbing  PSYCH: alert & oriented x 3 with fluent speech NEURO: no focal motor/sensory deficits SKIN:  no rashes or significant lesions  LABORATORY DATA:  I have reviewed the data as listed Lab Results  Component Value Date   WBC 5.8 07/06/2017   HGB 6.8 (L) 07/06/2017   HCT 19.6 (L) 07/06/2017   MCV 82.5 07/06/2017   PLT 178 07/06/2017   Recent Labs    05/04/17 1923 06/29/17 1701 07/03/17 1303  NA 133* 133* 132*  K 4.1 4.7 4.2  CL 104 103 105  CO2 22 19* 20*  GLUCOSE 134* 217* 150*  BUN 30* 28* 25*  CREATININE 1.94* 2.19* 2.20*  CALCIUM 8.6* 8.3* 8.3*  GFRNONAA 33* 29* 28*  GFRAA 38* 33* 33*   PROT 6.8 6.1 6.8  ALBUMIN 3.1* 3.1* 2.9*  AST 23 11 20   ALT 16 8 9*  ALKPHOS 118 102 92  BILITOT 0.6 0.3 0.7  BILIDIR  --   --  <0.1*  IBILI  --   --  NOT CALCULATED    RADIOGRAPHIC STUDIES: I have personally reviewed the radiological images as listed and agreed with the findings in the report. Mr Brain Wo Contrast  Result Date: 07/03/2017 CLINICAL DATA:  Headache with dizziness and weakness. Nine weeks pregnant. Type 1 diabetes. Renal failure. EXAM: MRI HEAD WITHOUT CONTRAST MRV HEAD WITHOUT CONTRAST TECHNIQUE: Multiplanar, multiecho pulse sequences of the brain and surrounding structures were obtained without intravenous contrast. Angiographic images  of the intracranial venous structures were obtained using MRV technique without intravenous contrast. COMPARISON:  None. FINDINGS: Brain MRI: Brain: No evidence of infarct, hemorrhage, hydrocephalus, collection, or mass. No white matter disease. Vascular: Major flow voids are preserved. Skull and cervical spine: Normal marrow signal. Orbits and sinuses: Negative.  No sinusitis to explain headache. Other: Large bilateral cervical lymph nodes that are nonspecific. MRV: No dural venous sinus thrombosis. The right transverse and sigmoid system is dominant. The deep veins are symmetrically visualized. No evidence of cortical vein thrombosis. IMPRESSION: 1. Normal brain MRI and MRV.  No dural venous sinus thrombosis. 2. Enlarged bilateral cervical nodes. Please correlate with neck exam and for URI symptoms. Electronically Signed   By: Monte Fantasia M.D.   On: 07/03/2017 20:24   Mr Annell Greening Head  Result Date: 07/03/2017 CLINICAL DATA:  Headache with dizziness and weakness. Nine weeks pregnant. Type 1 diabetes. Renal failure. EXAM: MRI HEAD WITHOUT CONTRAST MRV HEAD WITHOUT CONTRAST TECHNIQUE: Multiplanar, multiecho pulse sequences of the brain and surrounding structures were obtained without intravenous contrast. Angiographic images of the intracranial  venous structures were obtained using MRV technique without intravenous contrast. COMPARISON:  None. FINDINGS: Brain MRI: Brain: No evidence of infarct, hemorrhage, hydrocephalus, collection, or mass. No white matter disease. Vascular: Major flow voids are preserved. Skull and cervical spine: Normal marrow signal. Orbits and sinuses: Negative.  No sinusitis to explain headache. Other: Large bilateral cervical lymph nodes that are nonspecific. MRV: No dural venous sinus thrombosis. The right transverse and sigmoid system is dominant. The deep veins are symmetrically visualized. No evidence of cortical vein thrombosis. IMPRESSION: 1. Normal brain MRI and MRV.  No dural venous sinus thrombosis. 2. Enlarged bilateral cervical nodes. Please correlate with neck exam and for URI symptoms. Electronically Signed   By: Monte Fantasia M.D.   On: 07/03/2017 20:24   US Ob Transvaginal  Result Date: 06/15/2017 ULTRASOUND REPORT Location: [facility] Date of Service:  06/14/2017 Indications: Dating/Viability Findings: Nelda Marseille intrauterine pregnancy is visualized with a CRL consistent with 6 3/[redacted] weeks gestation, giving an (U/S) EDD of 02/04/18. The (U/S) EDD IS NOT consistent with the clinically established (LMP) EDD of 01/28/18. FHR: 157 BPM CRL measurement: 5.8 mm Yolk sac and early anatomy is normal. Right Ovary measures 2.6 x 2.2 x 2.1 cm. It is normal in appearance. Left Ovary measures 3.6 x 2.4 x 2.0 cm. It is normal appearance. There is evidence of a corpus luteal cyst in the left ovary. Survey of the adnexa demonstrates no adnexal masses. There is no free peritoneal fluid in the cul de sac. Impression: 1. 6 3/7 week Viable Singleton Intrauterine pregnancy by U/S. 2. (U/S) EDD IS NOT consistent with Clinically established (LMP) EDD of 01/28/18. 3. EDD should be adjusted to match today's ultrasound. Recommendations: 1.Clinical correlation with the patient's History and Physical Exam. 2. EDD should be adjusted  to match today's ultrasound of 02/04/18. Dario Ave, RDMS I have reviewed this study and agree with documented findings. Rubie Maid, MD [facility]    ASSESSMENT & PLAN:   Iron deficiency anemia during pregnancy # Anemia sec to ? Iron def- ? CKD versus other causes-like hemolysis [PO iron constipation/bloated].  Patient is in first trimester pregnancy-would recommend holding IV iron at this time.  Recommend tandem pills.  Check repeat labs-CBC; LDH heptoglobin folic acid J62; PNH for flow cytometry.   # ? CKD/AKI- ? Poorly related DM vs others-autoimmune workup in progress.  Discussed with Dr. Candiss Norse.  #  IDDM [Dr.Kerr; [facility] physcian; ] Thyroid- TSH slightly low [as per pt okay]   # follow up in 4 weeks/labs-cbc; HOLD TUBE. Discussed with Dr.Singh.   Thank you Dr.Cherry for allowing me to participate in the care of your pleasant patient. Please do not hesitate to contact me with questions or concerns in the interim.  Addendum: Patient's hemoglobin 6.8; LDH 324; haptoglobin-less than 10-suggestive of hemolysis.  Elevated retic.  etiology unclear-check DAT/question empiric steroids.   CC;Dr.Singh; Dr.Cherry; Dr.Kerr; Jacqulynn Cadet   All questions were answered. The patient knows to call the clinic with any problems, questions or concerns.    Cammie Sickle, MD 07/07/2017 7:04 AM

## 2017-07-06 NOTE — Assessment & Plan Note (Addendum)
#   Anemia sec to ? Iron def- ? CKD versus other causes-like hemolysis [PO iron constipation/bloated].  Patient is in first trimester pregnancy-would recommend holding IV iron at this time.  Recommend tandem pills.  Check repeat labs-CBC; LDH heptoglobin folic acid B33; PNH for flow cytometry.   # ? CKD/AKI- ? Poorly related DM vs others-autoimmune workup in progress.  Discussed with Dr. Candiss Norse.  # IDDM [Dr.Kerr; [facility] physcian; ] Thyroid- TSH slightly low [as per pt okay]   # follow up in 4 weeks/labs-cbc; HOLD TUBE. Discussed with Dr.Singh.   Thank you Dr.Cherry for allowing me to participate in the care of your pleasant patient. Please do not hesitate to contact me with questions or concerns in the interim.  Addendum: Patient's hemoglobin 6.8; LDH 324; haptoglobin-less than 10-suggestive of hemolysis.  Elevated retic.  etiology unclear-check DAT/question empiric steroids.   CC;Dr.Singh; Dr.Cherry; Dr.Kerr; Jacqulynn Cadet

## 2017-07-06 NOTE — Progress Notes (Signed)
Patient here for iron def. Anemia. She received 1 unit of blood last week.  Pt reports fatigue and dizziness and shortness of breath with prolonged movement and climbing stairs. Patient reports that she is [redacted] weeks pregnant. She reports a h/o elevated creat level. Patient is a travel CNA. She would like to "resolve the anemia" due to nursing school classes. She starts back to school again in April.

## 2017-07-07 ENCOUNTER — Telehealth: Payer: Self-pay | Admitting: Internal Medicine

## 2017-07-07 ENCOUNTER — Other Ambulatory Visit: Payer: Self-pay | Admitting: *Deleted

## 2017-07-07 ENCOUNTER — Inpatient Hospital Stay: Payer: Medicaid Other

## 2017-07-07 DIAGNOSIS — D590 Drug-induced autoimmune hemolytic anemia: Secondary | ICD-10-CM

## 2017-07-07 DIAGNOSIS — O99019 Anemia complicating pregnancy, unspecified trimester: Principal | ICD-10-CM

## 2017-07-07 DIAGNOSIS — D509 Iron deficiency anemia, unspecified: Secondary | ICD-10-CM

## 2017-07-07 DIAGNOSIS — O99011 Anemia complicating pregnancy, first trimester: Secondary | ICD-10-CM | POA: Diagnosis not present

## 2017-07-07 LAB — RENAL FUNCTION PANEL
Albumin: 3 g/dL — ABNORMAL LOW (ref 3.5–5.0)
Anion gap: 10 (ref 5–15)
BUN: 24 mg/dL — ABNORMAL HIGH (ref 6–20)
CO2: 19 mmol/L — ABNORMAL LOW (ref 22–32)
Calcium: 8.5 mg/dL — ABNORMAL LOW (ref 8.9–10.3)
Chloride: 106 mmol/L (ref 101–111)
Creatinine, Ser: 2.01 mg/dL — ABNORMAL HIGH (ref 0.44–1.00)
GFR calc Af Amer: 36 mL/min — ABNORMAL LOW (ref >60)
GFR calc non Af Amer: 31 mL/min — ABNORMAL LOW (ref >60)
Glucose, Bld: 46 mg/dL — ABNORMAL LOW (ref 65–99)
Phosphorus: 4.1 mg/dL (ref 2.5–4.6)
Potassium: 3.7 mmol/L (ref 3.5–5.1)
Sodium: 135 mmol/L (ref 135–145)

## 2017-07-07 LAB — HAPTOGLOBIN: Haptoglobin: <10 mg/dL — ABNORMAL LOW (ref 34–200)

## 2017-07-07 LAB — PREPARE RBC (CROSSMATCH)

## 2017-07-07 LAB — DAT, POLYSPECIFIC AHG (ARMC ONLY): Polyspecific AHG test: NEGATIVE

## 2017-07-07 NOTE — Telephone Encounter (Signed)
I spoke with patient. She states that she is already eating and her blood sugar is back to normal. She inquired if a blood transfusion can be scheduled tom given her low hgb. Spoke with Dr. Jacinto Reap. Arranged for 1 unit of blood. Orders released and blood bank notified.

## 2017-07-07 NOTE — Telephone Encounter (Signed)
Please inform patient that her blood sugar was low at 46; recommend eating or drinking ASAP if not already.  please inform her rest of her testing is still pending. Will await for further results for new recommendations. Also let her know that I have spoken to Dr.Cherry.

## 2017-07-07 NOTE — Telephone Encounter (Signed)
Spoke to patient regarding concern for hemolysis/possible need for steroids.  Recommend DAT testing.  She will be here around noon [after her appointment with Dr. Candiss Norse; nephrology today].   Please schedule lab appt- THx

## 2017-07-08 ENCOUNTER — Inpatient Hospital Stay: Payer: Medicaid Other

## 2017-07-08 DIAGNOSIS — O99019 Anemia complicating pregnancy, unspecified trimester: Principal | ICD-10-CM

## 2017-07-08 DIAGNOSIS — D509 Iron deficiency anemia, unspecified: Secondary | ICD-10-CM

## 2017-07-08 DIAGNOSIS — O99011 Anemia complicating pregnancy, first trimester: Secondary | ICD-10-CM | POA: Diagnosis not present

## 2017-07-08 LAB — HEMOGLOBINOPATHY EVALUATION
Hgb A2 Quant: 2.6 % (ref 1.8–3.2)
Hgb A: 97.4 % (ref 96.4–98.8)
Hgb C: 0 %
Hgb F Quant: 0 % (ref 0.0–2.0)
Hgb S Quant: 0 %
Hgb Variant: 0 %

## 2017-07-08 LAB — HAPTOGLOBIN: Haptoglobin: <10 mg/dL — ABNORMAL LOW (ref 34–200)

## 2017-07-08 MED ORDER — ACETAMINOPHEN 325 MG PO TABS
650.0000 mg | ORAL_TABLET | Freq: Once | ORAL | Status: AC
  Administered 2017-07-08: 650 mg via ORAL
  Filled 2017-07-08: qty 2

## 2017-07-08 MED ORDER — DIPHENHYDRAMINE HCL 25 MG PO CAPS
25.0000 mg | ORAL_CAPSULE | Freq: Once | ORAL | Status: AC
  Administered 2017-07-08: 25 mg via ORAL
  Filled 2017-07-08: qty 1

## 2017-07-08 MED ORDER — SODIUM CHLORIDE 0.9 % IV SOLN
250.0000 mL | Freq: Once | INTRAVENOUS | Status: AC
  Administered 2017-07-08: 250 mL via INTRAVENOUS
  Filled 2017-07-08: qty 250

## 2017-07-09 ENCOUNTER — Telehealth: Payer: Self-pay | Admitting: Internal Medicine

## 2017-07-09 LAB — PNH PROFILE (-HIGH SENSITIVITY): Viability:: 99

## 2017-07-09 LAB — MATERNIT 21 PLUS CORE, BLOOD
Chromosome 13: NEGATIVE
Chromosome 18: NEGATIVE
Chromosome 21: NEGATIVE
Y Chromosome: NOT DETECTED

## 2017-07-09 LAB — TYPE AND SCREEN
ABO/RH(D): B POS
Antibody Screen: NEGATIVE
Unit division: 0
Unit division: 0

## 2017-07-09 LAB — BPAM RBC
Blood Product Expiration Date: 201903262359
Blood Product Expiration Date: 201904052359
ISSUE DATE / TIME: 201903141008
Unit Type and Rh: 1700
Unit Type and Rh: 7300

## 2017-07-09 LAB — HEMOGLOBIN FREE, PLASMA: Hgb, Plasma: 1.2 mg/dL (ref 0.0–4.9)

## 2017-07-09 NOTE — Telephone Encounter (Signed)
I spoke to patient regarding hemolysis as the cause of her anemia.  DAT negative-however I would recommend a trial of prednisone    Patient concerned regarding trying steroids because of first trimester pregnancy [10 weeks]; also poorly controlled blood sugars; weight gain. [Question risk of cleft palate/preterm/low birthweight-data is unclear-regarding the use of steroids in pregnancy especially first trimester]  She states that she will speak to her OB/and also to her endocrinologist-if she is interested in proceeding with steroids.   I spoke to patient's endocrinologist [id number] kindly agreed to manage her blood sugars if she went on steroids.  Also left a message for Dr. Marcelline Mates.

## 2017-07-10 LAB — HEMOSIDERIN, URINE: Hemosiderin Qual, Ur: NEGATIVE

## 2017-07-13 ENCOUNTER — Encounter: Payer: Self-pay | Admitting: Obstetrics and Gynecology

## 2017-07-14 ENCOUNTER — Ambulatory Visit
Admission: RE | Admit: 2017-07-14 | Discharge: 2017-07-14 | Disposition: A | Discharge status: Home / Self Care | Payer: Medicaid Other | Source: Ambulatory Visit | Attending: Internal Medicine | Admitting: Internal Medicine

## 2017-07-14 DIAGNOSIS — I10 Essential (primary) hypertension: Secondary | ICD-10-CM

## 2017-07-14 DIAGNOSIS — R944 Abnormal results of kidney function studies: Secondary | ICD-10-CM

## 2017-07-19 ENCOUNTER — Ambulatory Visit
Admission: RE | Admit: 2017-07-19 | Discharge: 2017-07-19 | Disposition: A | Discharge status: Home / Self Care | Payer: Medicaid Other | Source: Ambulatory Visit | Attending: Maternal & Fetal Medicine | Admitting: Maternal & Fetal Medicine

## 2017-07-19 VITALS — BP 142/82 | HR 98 | Temp 97.9°F | Resp 20 | Ht 67.0 in | Wt 190.8 lb

## 2017-07-19 DIAGNOSIS — Z794 Long term (current) use of insulin: Secondary | ICD-10-CM

## 2017-07-19 DIAGNOSIS — IMO0001 Reserved for inherently not codable concepts without codable children: Secondary | ICD-10-CM

## 2017-07-19 DIAGNOSIS — E1022 Type 1 diabetes mellitus with diabetic chronic kidney disease: Secondary | ICD-10-CM | POA: Insufficient documentation

## 2017-07-19 DIAGNOSIS — Z8249 Family history of ischemic heart disease and other diseases of the circulatory system: Secondary | ICD-10-CM | POA: Diagnosis not present

## 2017-07-19 DIAGNOSIS — Z79899 Other long term (current) drug therapy: Secondary | ICD-10-CM | POA: Insufficient documentation

## 2017-07-19 DIAGNOSIS — O24011 Pre-existing diabetes mellitus, type 1, in pregnancy, first trimester: Secondary | ICD-10-CM | POA: Diagnosis present

## 2017-07-19 DIAGNOSIS — O10211 Pre-existing hypertensive chronic kidney disease complicating pregnancy, first trimester: Secondary | ICD-10-CM | POA: Diagnosis present

## 2017-07-19 DIAGNOSIS — O10019 Pre-existing essential hypertension complicating pregnancy, unspecified trimester: Secondary | ICD-10-CM

## 2017-07-19 DIAGNOSIS — E119 Type 2 diabetes mellitus without complications: Secondary | ICD-10-CM

## 2017-07-19 DIAGNOSIS — Z3A11 11 weeks gestation of pregnancy: Secondary | ICD-10-CM | POA: Diagnosis not present

## 2017-07-19 DIAGNOSIS — N189 Chronic kidney disease, unspecified: Secondary | ICD-10-CM

## 2017-07-19 LAB — GLUCOSE, CAPILLARY: Glucose-Capillary: 121 mg/dL — ABNORMAL HIGH (ref 65–99)

## 2017-07-19 NOTE — Progress Notes (Signed)
MFM Consultation  32 yo [place] at [place] 3 days by  US performed at [facility]; measurements were consistent with 6 weeks 3 days and EDD of 02/04/18).  She is referred for consultation due to history of Type 1 DM and Chronic Kidney disease. She has had IDDM since age 21 and has recently been diagnosed with CKD.  In 03/2017 she was admitted to [facility] with hyperglycemia--she reports starting a new medication, Trulicity, which she took weekly.  She did not check blood glucose values for approx 2 weeks and was noted to have blood glucose of 600 as well as Cr of 2.8mg /dL.  In 2017 Cr was 0.95 and p:c ratio 273, GFR was 92 mL/min/1.73.    History is also significant for chronic HTN (on procardia 30mg  bid presently)  Her most recent labs: 07/03/17: GFR 33 07/14/17: Normal renal US and normal renal artery dopplers  She is currently on an insulin (novolog) pump and maintained on basal rate and boluses with meals.  She sees her Endocrinologist Insurance claims handler in [place] for adjustments/evaluation.  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     Past medical history  IDDM, as above  Vertigo---diagnosed due to 'room spinning', 06/2017--neg head MRI   Past Surgical History:  Procedure Laterality Date  . DILATION AND CURETTAGE OF UTERUS     x 4    OB History  Gravida Para Term Preterm AB Living  7 2 2   4 2   SAB TAB Ectopic Multiple Live Births    0     2    # Outcome Date GA Lbr Len/2nd Weight Sex Delivery Anes PTL Lv  7 Current           6 Term 06/2014 [redacted]w[redacted]d   M Vag-Spont   LIV     Complications: Mild pre-eclampsia  5 AB           4 AB           3 AB           2 AB           1 Term     M Vag-Spont   LIV   Current Outpatient Medications on File Prior to Encounter  Medication Sig Dispense Refill  . meclizine (ANTIVERT) 25  MG tablet Take 1 tablet (25 mg total) by mouth 3 (three) times daily as needed. 30 tablet 1  . NIFEdipine (PROCARDIA-XL/ADALAT-CC/NIFEDICAL-XL) 30 MG 24 hr tablet Take 2 tablets (60 mg total) by mouth daily. 30 tablet 0  . NOVOLOG FLEXPEN 100 UNIT/ML FlexPen 0-9 units injected 3 times daily with meals according to the following scale: CBG 70-120 = 0 units CBG 121-150 = 1 unit CBG 151-200 = 2 units CBG 201-250 = 3 units CBG 251-300 = 5 units CBG 301-350 = 7 units CBG 351-400 = 9 units CBG >400 = call MD for advice 15 mL 0  . Prenatal Vit-Fe Fumarate-FA (MULTIVITAMIN-PRENATAL) 27-0.8 MG TABS tablet Take 1 tablet by mouth daily at 12 noon.    . docusate sodium (COLACE) 100 MG capsule Take 1 capsule (100 mg total) by mouth 2 (two) times daily as needed. (Patient not taking: Reported on 07/19/2017) 30 capsule 2  . ferrous fumarate-iron polysaccharide complex (TANDEM) 162-115.2 MG CAPS capsule Take 1 capsule by mouth daily with  breakfast. (Patient not taking: Reported on 07/19/2017) 30 capsule 6  . ferrous sulfate 325 (65 FE) MG tablet Take 325 mg by mouth 3 (three) times daily.    Marland Kitchen LANTUS SOLOSTAR 100 UNIT/ML Solostar Pen Inject 20 Units into the skin at bedtime. (Patient not taking: Reported on 07/19/2017) 15 mL 0  . ondansetron (ZOFRAN ODT) 4 MG disintegrating tablet Take 1 tablet (4 mg total) by mouth every 8 (eight) hours as needed for nausea. (Patient not taking: Reported on 07/19/2017) 30 tablet 2   No current facility-administered medications on file prior to encounter.    Family history: Mother --HTN, Father, Brothers all alive and well, sister with paranoid schitzophrenia, no family history of birth defects.   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 No tobacco, etoh, ilicit drug use.   Allergies  Allergen Reactions  . Morphine     Other reaction(s): Hallucination  . Sulfamethoxazole-Trimethoprim Hives    Lab Results  Component Value Date    WBC 5.8 07/06/2017   HGB 6.8 (L) 07/06/2017   HCT 19.6 (L) 07/06/2017   MCV 82.5 07/06/2017   PLT 178 07/06/2017    Lab Results  Component Value Date   FOLATE 47.9 07/06/2017   Lab Results  Component Value Date   IRON 55 07/06/2017   TIBC 263 07/06/2017   FERRITIN 447 (H) 07/06/2017   Negative ANA, C-ANCA, P-ANCA, DAT, PNH panel  Lab Results  Component Value Date   RETICCTPCT 4.9 (H) 07/06/2017    Lab Results  Component Value Date   CREATININE 2.01 (H) 07/07/2017   GFR 33 on 07/03/17  Lab Results  Component Value Date   ALT 9 (L) 07/03/2017   AST 20 07/03/2017   ALKPHOS 92 07/03/2017   BILITOT 0.7 07/03/2017   Lab Results  Component Value Date   TSH 0.332 (L) 07/02/2017   Lab Results  Component Value Date   HGBA1C 6.5 (H) 06/29/2017   Type 1 DM with Renal Insufficiency/CKD Concerns related to diabetes in pregnancy are largely due to complications associated with poor glycemic control.  Early fetal concerns are related to possible fetal malformations---cardiac malformations are the most common major malformation associated with poor glycemic control. Later in gestation, hyperglycemia may lead to fetal macrosomia, increased operative/delivery birth injury, intrauterine fetal demise and post natal metabolic derangements/need for neonatal intensive care unit management of hypoglycemia. Close monitoring and management of glycemic control can decrease these risks.    We addressed the increase in pregnancy related risks (iugr, preeclampsia) due to renal insufficiency.  Her renal disease is considered moderate to severe; a Cr>=2 and GFR less than 60  Place her at higher risk for worsening renal disease in pregnancy (~20%), although these changes may not be permanent.  Hypertension/preeclampsia may mediate these deteriorations and poorly controlled hypertension may worsen the progression to end stage renal disease.  Moderate to severe renal disease further increased the risk for   for preterm preeclampsia and/or IUGR (~40-60%).  Given these concerns, we even addressed continuation of pregnancy---she has considered this but strongly desires to continue.   We reviewed testing approaches during pregnancy and importance of postprandial targets to avoid many of the DM related fetal adverse outcomes. Fasting and postprandial 1 or 2 hour checks. I gave her  goals of <100, 130 and 120 mg/dL respectively, for these time points.    Currently, she does not have wide variations--hypo/hyperglycemic episodes but is working on improving postprandial glycemic control.  Recommendations:  --She will continue to follow up with her  Endocrinologist weekly for insulin pump adjustments.    --We will see her for comanagement (she strongly desires to remain with your practice) but also addressed low threshold for transfer to tertiary care setting if warranted (eg. should she develop early preterm preeclampsia or worsening renal failure)  --Ophthalmology examination--she is planning follow up next week.   --Detailed fetal anatomic survey 18 weeks (scheduled). We have also scheduled a follow up MFM consultation to review glycemic management and available labs at that time.   --Recommend follow up creatinine/metabolic panel  and Thyroid panel in second trimester  Fetal echo ~20-22 weeks with pediatric cardiology Follow up growth ~28 and 32-34 weeks Antenatal testing weekly at 32 weeks and twice weekly at 34 weeks. (may be indicated sooner) Plan delivery 37 th week, sooner if clinically indicated.   Anemia Normal hemoglobin electrophoresis, no h/o bleeding. Neg DAT, Neg PNH. No evidence of hemolysis, FE normal Has seen Hemagology (Dr. Cheri Fowler poss due to CKD S/p 2 u PRBC Continue close surveillance --S/p 2u PRBCs during pregnancy, unclear etiology.  She has follow up with hematology--advised to continue iron replacement.

## 2017-07-20 ENCOUNTER — Other Ambulatory Visit: Payer: Self-pay | Admitting: Internal Medicine

## 2017-07-20 ENCOUNTER — Telehealth: Payer: Self-pay | Admitting: Internal Medicine

## 2017-07-20 DIAGNOSIS — O99019 Anemia complicating pregnancy, unspecified trimester: Principal | ICD-10-CM

## 2017-07-20 DIAGNOSIS — D509 Iron deficiency anemia, unspecified: Secondary | ICD-10-CM

## 2017-07-20 NOTE — Telephone Encounter (Signed)
Please inform patient that I would recommend checking CBC on a weekly basis; CBC/hold tube/LDH-starting either today or tomorrow; next week; and also on April 9 the patient is supposed to see me again.  Please order labs [[place] or [place] is okay]/schedule. Thx

## 2017-07-20 NOTE — Telephone Encounter (Signed)
Orders have been entered 

## 2017-07-21 ENCOUNTER — Encounter: Payer: Self-pay | Admitting: Obstetrics and Gynecology

## 2017-07-21 ENCOUNTER — Ambulatory Visit (INDEPENDENT_AMBULATORY_CARE_PROVIDER_SITE_OTHER): Payer: Medicaid Other | Admitting: Obstetrics and Gynecology

## 2017-07-21 VITALS — BP 125/83 | HR 118 | Ht 67.0 in | Wt 189.0 lb

## 2017-07-21 DIAGNOSIS — O0991 Supervision of high risk pregnancy, unspecified, first trimester: Secondary | ICD-10-CM

## 2017-07-21 DIAGNOSIS — O99891 Other specified diseases and conditions complicating pregnancy: Secondary | ICD-10-CM

## 2017-07-21 DIAGNOSIS — O24011 Pre-existing diabetes mellitus, type 1, in pregnancy, first trimester: Secondary | ICD-10-CM

## 2017-07-21 DIAGNOSIS — E663 Overweight: Secondary | ICD-10-CM

## 2017-07-21 DIAGNOSIS — O9989 Other specified diseases and conditions complicating pregnancy, childbirth and the puerperium: Secondary | ICD-10-CM

## 2017-07-21 DIAGNOSIS — D631 Anemia in chronic kidney disease: Secondary | ICD-10-CM

## 2017-07-21 DIAGNOSIS — R8271 Bacteriuria: Secondary | ICD-10-CM

## 2017-07-21 DIAGNOSIS — O10911 Unspecified pre-existing hypertension complicating pregnancy, first trimester: Secondary | ICD-10-CM

## 2017-07-21 DIAGNOSIS — O1211 Gestational proteinuria, first trimester: Secondary | ICD-10-CM

## 2017-07-21 DIAGNOSIS — O09299 Supervision of pregnancy with other poor reproductive or obstetric history, unspecified trimester: Secondary | ICD-10-CM

## 2017-07-21 DIAGNOSIS — R7989 Other specified abnormal findings of blood chemistry: Secondary | ICD-10-CM

## 2017-07-21 DIAGNOSIS — N189 Chronic kidney disease, unspecified: Secondary | ICD-10-CM

## 2017-07-21 LAB — POCT URINALYSIS DIPSTICK
Bilirubin, UA: NEGATIVE
Glucose, UA: NEGATIVE
Ketones, UA: NEGATIVE
Nitrite, UA: NEGATIVE
Spec Grav, UA: 1.015 (ref 1.010–1.025)
Urobilinogen, UA: 0.2 E.U./dL
pH, UA: 7 (ref 5.0–8.0)

## 2017-07-21 MED ORDER — ASPIRIN EC 81 MG PO TBEC: 81.0000 mg | DELAYED_RELEASE_TABLET | Freq: Every day | ORAL | 2 refills | Status: DC

## 2017-07-21 NOTE — Progress Notes (Signed)
ROB-Pt is doing well.

## 2017-07-21 NOTE — Patient Instructions (Signed)
Food Basics for Chronic Kidney Disease When your kidneys are not working well, they cannot remove waste and excess substances from your blood as effectively as they did before. This can lead to a buildup and imbalance of these substances, which can worsen kidney damage and affect how your body functions. Certain foods lead to a buildup of these substances in the body. By changing your diet as recommended by your diet and nutrition specialist (dietitian) or health care provider, you could help prevent further kidney damage and delay or prevent the need for dialysis. What are tips for following this plan? General instructions  Work with your health care provider and dietitian to develop a meal plan that is right for you. Foods you can eat, limit, or avoid will be different for each person depending on the stage of kidney disease and any other existing health conditions.  Talk with your health care provider about whether you should take a vitamin and mineral supplement.  Use standard measuring cups and spoons to measure servings of foods. Use a kitchen scale to measure portions of protein foods.  If directed by your health care provider, avoid drinking too much fluid. Measure and count all liquids, including water, ice, soups, flavored gelatin, and frozen desserts such as popsicles or ice cream. Reading food labels  Check the amount of sodium in foods. Choose foods that have less than 300 milligrams (mg) per serving.  Check the ingredient list for phosphorus or potassium-based additives or preservatives.  Check the amount of saturated and trans fat. Limit or avoid these fats as told by your dietitian. Shopping  Avoid buying foods that are: ? Processed, frozen, or prepackaged. ? Calcium-enriched or fortified.  Do not buy foods that have salt or sodium listed among the first five ingredients.  Do not buy canned vegetables. Cooking  Replace animal proteins, such as meat, fish, eggs, or dairy,  with plant proteins from beans, nuts, and soy. ? Use soy milk instead of cow's milk. ? Add beans or tofu to soups, casseroles, or pasta dishes instead of meat.  Soak vegetables, such as potatoes, before cooking to reduce potassium. To do this: ? Peel and cut into small pieces. ? Soak in warm water for at least 2 hours. For every 1 cup of vegetables, use 10 cups of water. ? Drain and rinse with warm water. ? Boil for at least 5 minutes. Meal planning  Limit the amount of protein from plant and animal sources you eat each day.  Do not add salt to food when cooking or before eating.  Eat meals and snacks at around the same time each day. If you have diabetes:  If you have diabetes (diabetes mellitus) and chronic kidney disease, it is important to keep your blood glucose in the target range recommended by your health care provider. Follow your diabetes management plan. This may include: ? Checking your blood glucose regularly. ? Taking oral medicines, insulin, or both. ? Exercising for at least 30 minutes on 5 or more days each week, or as told by your health care provider. ? Tracking how many servings of carbohydrates you eat at each meal.  You may be given specific guidelines on how much of certain foods and nutrients you may eat, depending on your stage of kidney disease and whether you have high blood pressure (hypertension). Follow your meal plan as told by your dietitian. What nutrients should be limited? The items listed are not a complete list. Talk with your dietitian about  what dietary choices are best for you. Potassium Potassium affects how steadily your heart beats. If too much potassium builds up in your blood, it can cause an irregular heartbeat or even a heart attack. You may need to eat less potassium, depending on your blood potassium levels and the stage of kidney disease. Talk to your dietitian about how much potassium you may have each day. You may need to limit or  avoid foods that are high in potassium, such as:  Milk and soy milk.  Fruits, such as bananas, papaya, apricots, nectarines, melon, prunes, raisins, kiwi, and oranges.  Vegetables, such as potatoes, sweet potatoes, yams, tomatoes, leafy greens, beets, okra, avocado, pumpkin, and winter squash.  White and lima beans.  Phosphorus Phosphorus is a mineral found in your bones. A balance between calcium and phosphorous is needed to build and maintain healthy bones. Too much phosphorus pulls calcium from your bones. This can make your bones weak and more likely to break. Too much phosphorus can also make your skin itch. You may need to eat less phosphorus depending on your blood phosphorus levels and the stage of kidney disease. Talk to your dietitian about how much potassium you may have each day. You may need to take medicine to lower your blood phosphorus levels if diet changes do not help. You may need to limit or avoid foods that are high in phosphorus, such as:  Milk and dairy products.  Dried beans and peas.  Tofu, soy milk, and other soy-based meat replacements.  Colas.  Nuts and peanut butter.  Meat, poultry, and fish.  Bran cereals and oatmeals.  Protein Protein helps you to make and keep muscle. It also helps in the repair of your body's cells and tissues. One of the natural breakdown products of protein is a waste product called urea. When your kidneys are not working properly, they cannot remove wastes, such as urea, like they did before you developed chronic kidney disease. Reducing how much protein you eat can help prevent a buildup of urea in your blood. Depending on your stage of kidney disease, you may need to limit foods that are high in protein. Sources of animal protein include:  Meat (all types).  Fish and seafood.  Poultry.  Eggs.  Dairy.  Other protein foods include:  Beans and legumes.  Nuts and nut butter.  Soy and tofu.  Sodium Sodium, which is  found in salt, helps maintain a healthy balance of fluids in your body. Too much sodium can increase your blood pressure and have a negative effect on the function of your heart and lungs. Too much sodium can also cause your body to retain too much fluid, making your kidneys work harder. Most people should have less than 2,300 milligrams (mg) of sodium each day. If you have hypertension, you may need to limit your sodium to 1,500 mg each day. Talk to your dietitian about how much sodium you may have each day. You may need to limit or avoid foods that are high in sodium, such as:  Salt seasonings.  Soy sauce.  Cured and processed meats.  Salted crackers and snack foods.  Fast food.  Canned soups and most canned foods.  Pickled foods.  Vegetable juice.  Boxed mixes or ready-to-eat boxed meals and side dishes.  Bottled dressings, sauces, and marinades.  Summary  Chronic kidney disease can lead to a buildup and imbalance of waste and excess substances in the body. Certain foods lead to a buildup of  as:  · Salt seasonings.  · Soy sauce.  · Cured and processed meats.  · Salted crackers and snack foods.  · Fast food.  · Canned soups and most canned foods.  · Pickled foods.  · Vegetable juice.  · Boxed mixes or ready-to-eat boxed meals and side dishes.  · Bottled dressings, sauces, and marinades.    Summary  · Chronic kidney disease can lead to a buildup and imbalance of waste and excess substances in the body. Certain foods lead to a buildup of these substances. By adjusting your intake of these foods, you could help prevent more kidney damage and delay or prevent the need for dialysis.  · Food adjustments are different for each person with chronic kidney disease. Work with a dietitian to set up nutrient goals and a meal plan that is right for you.  · If you have diabetes and chronic kidney disease, it is important to keep your blood glucose in the target range recommended by your health care provider.  This information is not intended to replace advice given to you by your health care provider. Make sure you discuss any questions you have with your health care provider.  Document Released: 07/04/2002 Document Revised: 04/08/2016 Document Reviewed: 04/08/2016  Elsevier Interactive Patient Education © 2018 [employer]

## 2017-07-22 NOTE — Progress Notes (Signed)
OBSTETRIC INITIAL PRENATAL VISIT  Subjective:    Kelly Huff is being seen today for her first obstetrical visit.  This is a planned pregnancy. She is a [id number] female at [redacted]w[redacted]d gestation, Estimated Date of Delivery: 02/04/18 with last menstrual period 04/30/2017, inconsistent with 6 week sono. Her obstetrical history is significant for Type 1 DM, cHTN, chronic renal insufficiency, severe anemia requiring blood transfusion in early pregnancy, h/o pre-eclampsia in prior pregnancy. Relationship with FOB: significant other, living together. Patient does intend to breast feed. Pregnancy history fully reviewed.    OB History  Gravida Para Term Preterm AB Living  7 2 2  0 4 2  SAB TAB Ectopic Multiple Live Births  0 0 0 0 2    # Outcome Date GA Lbr Len/2nd Weight Sex Delivery Anes PTL Lv  7 Current           6 Term 06/2014 [redacted]w[redacted]d   M Vag-Spont   LIV     Complications: Mild pre-eclampsia  5 AB           4 AB           3 AB           2 AB           1 Term     M Vag-Spont   LIV    Gynecologic History:  Last pap smear was 12/2016.  Results were normal.  Denies h/o abnormal pap smears iin the past.  Denies history of STIs.    Past Medical History:  Diagnosis Date  . Diabetes mellitus without complication ([place])   . History of pre-eclampsia 2016   mild  . Hypertension      Family History  Problem Relation Age of Onset  . Hypertension Mother   . Cancer Mother        Uterine vs cervical (pt unsure),   . Schizophrenia Brother      Past Surgical History:  Procedure Laterality Date  . DILATION AND CURETTAGE OF UTERUS     x 4     Social History   Socioeconomic History  . Marital status: Single    Spouse name: Not on file  . Number of children: Not on file  . Years of education: Not on file  . Highest education level: Not on file  Occupational History  . Not on file  Social Needs  . Financial resource strain: Not on file  . Food insecurity:    Worry: Not on file      Inability: Not on file  . Transportation needs:    Medical: Not on file    Non-medical: Not on file  Tobacco Use  . Smoking status: Never Smoker  . Smokeless tobacco: Never Used  Substance and Sexual Activity  . Alcohol use: No  . Drug use: No  . Sexual activity: Yes    Birth control/protection: None  Lifestyle  . Physical activity:    Days per week: Not on file    Minutes per session: Not on file  . Stress: Not on file  Relationships  . Social connections:    Talks on phone: Not on file    Gets together: Not on file    Attends religious service: Not on file    Active member of club or organization: Not on file    Attends meetings of clubs or organizations: Not on file    Relationship status: Not on file  . Intimate partner violence:  Fear of current or ex partner: Not on file    Emotionally abused: Not on file    Physically abused: Not on file    Forced sexual activity: Not on file  Other Topics Concern  . Not on file  Social History Narrative  . Not on file     Current Outpatient Medications on File Prior to Visit  Medication Sig Dispense Refill  . cyclobenzaprine (FLEXERIL) 10 MG tablet Take 10 mg by mouth 3 (three) times daily as needed for muscle spasms.    . meclizine (ANTIVERT) 25 MG tablet Take 1 tablet (25 mg total) by mouth 3 (three) times daily as needed. 30 tablet 1  . NIFEdipine (PROCARDIA-XL/ADALAT-CC/NIFEDICAL-XL) 30 MG 24 hr tablet Take 2 tablets (60 mg total) by mouth daily. 30 tablet 0  . NOVOLOG FLEXPEN 100 UNIT/ML FlexPen 0-9 units injected 3 times daily with meals according to the following scale: CBG 70-120 = 0 units CBG 121-150 = 1 unit CBG 151-200 = 2 units CBG 201-250 = 3 units CBG 251-300 = 5 units CBG 301-350 = 7 units CBG 351-400 = 9 units CBG >400 = call MD for advice 15 mL 0  . Prenatal Vit-Fe Fumarate-FA (MULTIVITAMIN-PRENATAL) 27-0.8 MG TABS tablet Take 1 tablet by mouth daily at 12 noon.    . docusate sodium (COLACE) 100 MG  capsule Take 1 capsule (100 mg total) by mouth 2 (two) times daily as needed. (Patient not taking: Reported on 07/19/2017) 30 capsule 2  . ferrous fumarate-iron polysaccharide complex (TANDEM) 162-115.2 MG CAPS capsule Take 1 capsule by mouth daily with breakfast. (Patient not taking: Reported on 07/19/2017) 30 capsule 6  . ferrous sulfate 325 (65 FE) MG tablet Take 325 mg by mouth 3 (three) times daily.    Marland Kitchen LANTUS SOLOSTAR 100 UNIT/ML Solostar Pen Inject 20 Units into the skin at bedtime. (Patient not taking: Reported on 07/19/2017) 15 mL 0  . ondansetron (ZOFRAN ODT) 4 MG disintegrating tablet Take 1 tablet (4 mg total) by mouth every 8 (eight) hours as needed for nausea. (Patient not taking: Reported on 07/19/2017) 30 tablet 2   No current facility-administered medications on file prior to visit.     Allergies  Allergen Reactions  . Morphine     Other reaction(s): Hallucination  . Sulfamethoxazole-Trimethoprim Hives     Review of Systems General:Not Present- Fever, Weight Loss and Weight Gain. Skin:Not Present- Rash. HEENT:Not Present- Blurred Vision, Headache and Bleeding Gums. Respiratory:Not Present- Cough. Present - dyspnea on exertion Breast:Not Present- Breast Mass. Cardiovascular:Not Present- Chest Pain, Fainting / Blacking Out and Shortness of Breath. Gastrointestinal:Not Present- Abdominal Pain, Constipation, Nausea and Vomiting (however has improved significantly over past few weeks with use of Zofran). Female Genitourinary:Not Present- Frequency, Painful Urination, Pelvic Pain, Vaginal Bleeding, Vaginal Discharge, Contractions, regular, Fetal Movements Decreased, Urinary Complaints and Vaginal Fluid. Musculoskeletal:Not Present- Back Pain and Leg Cramps. Neurological:Not Present- Dizziness. Psychiatric:Not Present- Depression.     Objective:   Blood pressure 125/83, pulse (!) 118, weight 189 lb (85.7 kg), last menstrual period 04/30/2017.  Body mass index is  29.6 kg/m.  General Appearance:    Alert, cooperative, no distress, appears stated age, overweight  Head:    Normocephalic, without obvious abnormality, atraumatic  Eyes:    PERRL, conjunctiva/corneas clear, EOM's intact, both eyes  Ears:    Normal external ear canals, both ears  Nose:   Nares normal, septum midline, mucosa normal, no drainage or sinus tenderness  Throat:   Lips, mucosa, and tongue  normal; teeth and gums normal  Neck:   Supple, symmetrical, trachea midline, no adenopathy; thyroid: no enlargement/tenderness/nodules; no carotid bruit or JVD  Back:     Symmetric, no curvature, ROM normal, no CVA tenderness  Lungs:     Clear to auscultation bilaterally, respirations unlabored  Chest Wall:    No tenderness or deformity   Heart:    Regular rate and rhythm, S1 and S2 normal, no murmur, rub or gallop  Breast Exam:    No tenderness, masses, or nipple abnormality  Abdomen:     Soft, non-tender, bowel sounds active all four quadrants, no masses, no organomegaly.  FHT 162  bpm.  Genitalia:    Pelvic:external genitalia normal, vagina without lesions, discharge, or tenderness, rectovaginal septum  normal. Cervix normal in appearance, no cervical motion tenderness, no adnexal masses or tenderness.  Pregnancy positive findings: uterine enlargement: 12 wk size, nontender.   Rectal:    Normal external sphincter.  No hemorrhoids appreciated. Internal exam not done.   Extremities:   Extremities normal, atraumatic, no cyanosis or edema  Pulses:   2+ and symmetric all extremities  Skin:   Skin color, texture, turgor normal, no rashes or lesions  Lymph nodes:   Cervical, supraclavicular, and axillary nodes normal  Neurologic:   CNII-XII intact, normal strength, sensation and reflexes throughout    Assessment:    Pregnancy, supervision, high-risk, first trimester Type 1 diabetes mellitus affecting pregnancy in first trimester, antepartum Chronic renal impairment, unspecified CKD stage Chronic  hypertension with exacerbation during pregnancy in first trimester History of pre-eclampsia in prior pregnancy, currently pregnant Asymptomatic bacteriuria during pregnancy in first trimester Anemia due to chronic kidney disease, unspecified CKD stage Abnormal thyroid blood test Overweight (BMI 25.0-29.9)  Plan:   1. Pregnancy at 11.5 weeks (high risk) - Initial labs reviewed. - Pap smear up to date - Prenatal vitamins encouraged. - Problem list reviewed and updated. - New OB counseling:  The patient has been given an overview regarding routine prenatal care. - Prenatal testing, optional genetic testing, and ultrasound use in pregnancy were reviewed.  Patient has had normal MaterniT21 testing. Will need spina bifida screen after 16 weeks.  - Benefits of Breast Feeding were discussed. The patient is encouraged to consider nursing her baby post partum. - Patient has been seen by [facility] yesterday.  Notes that discussion was had on high risk pregnancy, options of termination vs continuing pregnancy under strict supervision were given. Patient does not desire to terminate.  Understands the high risk nature of her pregnancy.  Will continue to f/u with [facility] for now, and if patient's condition worsens, will be transferred to [facility] for remainder of her care. Patient will have her ultrasounds performed at [facility], beginning at 16-18 weeks for detailed anatomy scan.    2. Type 1 diabetes mellitus affecting pregnancy in first trimester, antepartum - Patient recently started on Insulin pump earlier this month. Is currently being managed by Endocrinologist. Notes that her blood sugars have been much better controlled, all less than 120s.  Most recent HgbA1c has decreased from 9.8 to 6.5.  - Patient will need to begin growth scans and antenatal testing in 3rd trimester - Will likely need to be delivered by no later than [redacted] weeks gestation if not sooner due to  medical comorbidites (CKD, CHTN, Type 1 DM).   3. Chronic renal impairment, unspecified CKD stage - Patient has been seen by Nephrologist.  No new recommendations at this time  as patient's Creatinine level has stabilized. Will follow up if condition continues to worsen.  - Will follow Creatinine each trimester, or more frequently if condition worsens.   4. Chronic hypertension with exacerbation during pregnancy in first trimester - CHTN developed over past 2 years after last pregnancy was complicated pre-eclampsia. Was placed on Metoprolol after last pregnancy but did not have any further follow up.  - Baseline PIH labs have been reviewed.  - Patient already with significant proteinuria secondary to chronic kidney disease. - Was changed from Labetalol to Procardia earlier in the pregnancy due to side effects. Currently BPs better controlled.  - Patient has not yet initiated daily aspirin 81 mg.  Will prescribe.   5. History of pre-eclampsia in prior pregnancy, currently pregnant - Baseline PIH labs have been reviewed. - Discussed that patient was at high risk for reoccurrence during current pregnancy, and possibly at even an earlier gestation. Will monitor closely during late second and into 3rd trimester.   6. Asymptomatic bacteriuria during pregnancy in first trimester - Treated with Macrobid. Will need repeat urine culture.   7. Anemia due to chronic kidney disease, unspecified CKD stage - Patient has been seen by Nephrologist and Hematologist.  Patient notes that Hematologist recommends corticosteroids to help treat the anemia.  Patient has been very hesitant thus far due to the risk of congenital defects (cleft lip/palate risk increases slightly when used in 1st trimester), weight gain, as well as interference with her blood sugars as she is just getting diabetes under control. Patient currently notes that she only feels her anemia with exertion (going up and down stairs at work) but not at  rest.  - Patient has received a blood transfusion (1 unit) earlier during the pregnancy (around 7 weeks).  - Discussed risks vs benefits of corticosteriods in pregnancy, vs requiring future transfusions in pregnancy.  Patient desires to get out of the first trimester before considering use of steroids. Will revisit conversation in second trimester. Will recheck levels in 2nd trimester.  - She is not taking iron therapy anymore due to constipation.  I encouraged patient that although her anemia mainly stems from her kidney disease, she is still at risk for iron deficiency due to the pregnancy. Encouraged to decrease use to once daily (previously prescribed for 3 x daily) to relieve side effects.   8. Abnormal thyroid blood test - Patient with abnormal thyroid studies on NOB labs, however had significant nausea/vomiting during early pregnancy as well.  Will repeat levels.    9. Overweight (BMI 25.0-29.9)  - Recommendations regarding diet, weight gain, and exercise in pregnancy were given. - Patient given referral to Nutritionist as she desires assistance with creating a healthy diet during the pregnancy that takes into account her other medical comorbidities.   Follow up in 2 weeks.  Discussed need for closer monitoring currently, will likely have visits q 2-3 weeks until more stable in the pregnancy.    The patient has Medicaid.  [facility] Medicaid Risk Screening Form completed today.    >50% of 30 min visit spent on counseling and coordination of care.     Rubie Maid, MD [facility]

## 2017-07-27 ENCOUNTER — Encounter: Payer: Self-pay | Admitting: Dietician

## 2017-07-27 ENCOUNTER — Inpatient Hospital Stay: Payer: Medicaid Other | Attending: Internal Medicine

## 2017-07-27 ENCOUNTER — Encounter: Payer: Medicaid Other | Attending: Obstetrics and Gynecology | Admitting: Dietician

## 2017-07-27 VITALS — BP 136/78 | Ht 67.0 in | Wt 192.1 lb

## 2017-07-27 DIAGNOSIS — Z3A11 11 weeks gestation of pregnancy: Secondary | ICD-10-CM | POA: Insufficient documentation

## 2017-07-27 DIAGNOSIS — O10311 Pre-existing hypertensive heart and chronic kidney disease complicating pregnancy, first trimester: Secondary | ICD-10-CM | POA: Insufficient documentation

## 2017-07-27 DIAGNOSIS — E1022 Type 1 diabetes mellitus with diabetic chronic kidney disease: Secondary | ICD-10-CM | POA: Insufficient documentation

## 2017-07-27 DIAGNOSIS — O99012 Anemia complicating pregnancy, second trimester: Secondary | ICD-10-CM | POA: Diagnosis present

## 2017-07-27 DIAGNOSIS — I1310 Hypertensive heart and chronic kidney disease without heart failure, with stage 1 through stage 4 chronic kidney disease, or unspecified chronic kidney disease: Secondary | ICD-10-CM | POA: Diagnosis not present

## 2017-07-27 DIAGNOSIS — D589 Hereditary hemolytic anemia, unspecified: Secondary | ICD-10-CM | POA: Diagnosis present

## 2017-07-27 DIAGNOSIS — R8271 Bacteriuria: Secondary | ICD-10-CM

## 2017-07-27 DIAGNOSIS — N189 Chronic kidney disease, unspecified: Secondary | ICD-10-CM | POA: Diagnosis not present

## 2017-07-27 DIAGNOSIS — O10911 Unspecified pre-existing hypertension complicating pregnancy, first trimester: Secondary | ICD-10-CM | POA: Insufficient documentation

## 2017-07-27 DIAGNOSIS — D509 Iron deficiency anemia, unspecified: Secondary | ICD-10-CM

## 2017-07-27 DIAGNOSIS — O99019 Anemia complicating pregnancy, unspecified trimester: Secondary | ICD-10-CM

## 2017-07-27 DIAGNOSIS — E119 Type 2 diabetes mellitus without complications: Secondary | ICD-10-CM | POA: Diagnosis not present

## 2017-07-27 DIAGNOSIS — O24011 Pre-existing diabetes mellitus, type 1, in pregnancy, first trimester: Secondary | ICD-10-CM | POA: Insufficient documentation

## 2017-07-27 DIAGNOSIS — O9989 Other specified diseases and conditions complicating pregnancy, childbirth and the puerperium: Secondary | ICD-10-CM

## 2017-07-27 DIAGNOSIS — O99891 Other specified diseases and conditions complicating pregnancy: Secondary | ICD-10-CM

## 2017-07-27 LAB — CBC WITH DIFFERENTIAL/PLATELET
Basophils Absolute: 0 10*3/uL (ref 0–0.1)
Basophils Relative: 1 %
Eosinophils Absolute: 0.2 10*3/uL (ref 0–0.7)
Eosinophils Relative: 2 %
HCT: 19.9 % — ABNORMAL LOW (ref 35.0–47.0)
Hemoglobin: 6.9 g/dL — ABNORMAL LOW (ref 12.0–16.0)
Lymphocytes Relative: 12 %
Lymphs Abs: 1 10*3/uL (ref 1.0–3.6)
MCH: 29.4 pg (ref 26.0–34.0)
MCHC: 34.7 g/dL (ref 32.0–36.0)
MCV: 84.9 fL (ref 80.0–100.0)
Monocytes Absolute: 0.5 10*3/uL (ref 0.2–0.9)
Monocytes Relative: 6 %
Neutro Abs: 6.9 10*3/uL — ABNORMAL HIGH (ref 1.4–6.5)
Neutrophils Relative %: 79 %
Platelets: 213 10*3/uL (ref 150–440)
RBC: 2.35 MIL/uL — ABNORMAL LOW (ref 3.80–5.20)
RDW: 14.9 % — ABNORMAL HIGH (ref 11.5–14.5)
WBC: 8.6 10*3/uL (ref 3.6–11.0)

## 2017-07-27 LAB — LACTATE DEHYDROGENASE: LDH: 302 U/L — ABNORMAL HIGH (ref 98–192)

## 2017-07-27 LAB — SAMPLE TO BLOOD BANK

## 2017-07-27 NOTE — Patient Instructions (Signed)
  Check blood sugars  before each meal and 2 hr after each meal  every day and record  Bring blood sugar records to the next appointment  Eat 3 meals/day and 3 snacks/day  Space meals and snacks  2-3 hours apart  Eat 30 grams carbohydrates for breakfast and 45 grams carbohydrates for lunch and for supper + protein  Eat 15 grams carbohydrates/snack + protein  Avoid sugar sweetened drinks (soda, tea, coffee, sports drinks, juices)  Limit intake of sweets and fried foods  Complete 3 Day Food Record and estimate carb grams-bring to next appt with dietitian  Carry fast acting glucose and a snack at all times  Rotate  sites for Omnipod insulin pump  Take Novolog 15-20 min. before meals and snacks when able  Carry medical alert ID at all times  Walk 20 min. daily if permitted  by MD  Return for appointment/classes on:  08-11-17

## 2017-07-27 NOTE — Progress Notes (Signed)
Diabetes Self-Management Education  Visit Type: First/Initial  Appt. Start Time: 1500 Appt. End Time: 1600  07/27/2017  Kelly Huff, identified by name and date of birth, is a 32 y.o. 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   ASSESSMENT  Blood pressure 136/78, height 5\' 7"  (1.702 m), weight 192 lb 1.6 oz (87.1 kg), last menstrual period 04/30/2017. Body mass index is 30.09 kg/m.  Diabetes Self-Management Education - 07/27/17 1630      Visit Information   Visit Type  First/Initial      Initial Visit   Diabetes Type  Type 1 complicated by pregnancy      Health Coping   How would you rate your overall health?  Poor      Psychosocial Assessment   Patient Belief/Attitude about Diabetes  Motivated to manage diabetes    Self-care barriers  None    Self-management support  Doctor's office;Family    Other persons present  Patient    Patient Concerns  Nutrition/Meal planning;Glycemic Control;Weight Control;Medication become more fit, prevent complications    Special Needs  None    Preferred Learning Style  Hands on    Learning Readiness  Ready    What is the last grade level you completed in school?  presently in nursing school      Pre-Education Assessment   Patient understands the diabetes disease and treatment process.  Demonstrates understanding / competency    Patient understands incorporating nutritional management into lifestyle.  Needs Review    Patient undertands incorporating physical activity into lifestyle.  Needs Review    Patient understands using medications safely.  Demonstrates understanding / competency    Patient understands monitoring blood glucose, interpreting and using results  Demonstrates understanding / competency    Patient understands prevention, detection, and treatment of acute complications.  Demonstrates understanding / competency    Patient understands prevention, detection, and treatment of chronic  complications.  Demonstrates understanding / competency    Patient understands how to develop strategies to address psychosocial issues.  Demonstrates understanding / competency    Patient understands how to develop strategies to promote health/change behavior.  Demonstrates understanding / competency      Complications   Last HgB A1C per patient/outside source  6.5 % 06-29-17    How often do you check your blood sugar?  > 4 times/day    Postprandial Blood glucose range (mg/dL)  <70 x2 past 2-3 wk since starting on Omnipod pump-30's x1 and 50's x1    Have you had a dilated eye exam in the past 12 months?  No 06-2016    Have you had a dental exam in the past 12 months?  No 5 years ago    Are you checking your feet?  Yes    How many days per week are you checking your feet?  7      Dietary Intake   Breakfast  eats breakfast at 6-7a=pt carb counts using ICR 1:10 (wears Omnipod pump with Novolog-started pump 3 wks ago)  often eats only banana with fruit juice for breakfast     Snack (morning)  eats fruit or crackers with peanut butter for snack about 9-10a    Lunch  eats lunch at 11a-12 p    Snack (afternoon)  none    Dinner  eats supper 5p-8p    Snack (evening)  eats snack at 8p (cold cereal and milk)    Beverage(s)  drinks water 4-5x/day, crystal light 6-7x/day and milk  2-3x/day      Exercise   Exercise Type  ADL's      Patient Education   Previous Diabetes Education Yes (please comment)    Nutrition management  Food label reading, portion sizes and measuring food;Carbohydrate counting-advised to eat 30 grams carbs/breakfast and 45 grams carbs/lunch and supper + protein; eat 15 grams carbs/snack + protein; eat 3 meals and 3 snacks/day-eat meals and snacks every 2-3 hours    Medications Reviewed patients medication for diabetes, action, purpose, timing of dose and side effects.;Taught/reviewed insulin injection, site rotation, insulin storage and needle disposal. uses Novolog in Omnipod pump  with basal rate 12a=0.7, ICR 1:10, ISF 35, target BG 95-120, active insulin time 4 hours    Monitoring Identified appropriate SMBG and/or A1C goals.;Taught/discussed recording of test results and interpretation of SMBG.;Purpose and frequency of SMBG.    Acute complications Taught treatment of hypoglycemia - the 15 rule.;Discussed and identified patients' treatment of hyperglycemia; reviewed how to manage high BG's with insulin pump-always have back up Novolog and Lantus  pens in case of pump failure.    Chronic complications Relationship between chronic complications and blood glucose control    Preconception care Pregnancy and GDM  Role of pre-pregnancy blood glucose control on the development of the fetus;Reviewed with patient blood glucose goals with pregnancy;Role of family planning for patients with diabetes; discussed how pregnancy can cause worsening complications during pregnancy   Personal strategies to promote health  Lifestyle issues that need to be addressed for better diabetes care;Helped patient develop diabetes management plan for (enter comment)      Outcomes   Expected Outcomes  Demonstrated limited interest in learning.  Expect minimal changes       Individualized Plan for Diabetes Self-Management Training:   Learning Objective:  Patient will have a greater understanding of diabetes self-management. Patient education plan is to attend individual and/or group sessions per assessed needs and concerns.   Plan:   Patient Instructions   Check blood sugars  before each meal and 2 hr after each meal  every day and record  Bring blood sugar records to the next appointment  Eat 3 meals/day and 3 snacks/day  Space meals and snacks  2-3 hours apart  Eat 30 grams carbohydrates for breakfast and 45 grams carbohydrates for lunch and for supper + protein  Eat 15 grams carbohydrates/snack + protein  Avoid sugar sweetened drinks (soda, sweetened  Tea/ coffee, sports drinks, fruit  juices)  Limit intake of sweets and fried foods  Complete 3 Day Food Record and estimate carb grams-bring to next appt with dietitian  Carry fast acting glucose and a snack at all times  Rotate  sites for Omnipod insulin pump  Take Novolog 15-20 min. before meals and snacks when able  Carry medical alert ID at all times  Walk 20 min. daily if permitted  by MD  Return for appointment/classes on:  08-11-17   Expected Outcomes:  Demonstrated limited interest in learning.  Expect minimal changes  Education material provided: Diabetes and Mothers To Be booklet, A1C handout, medical alert ID card, coupon for medical alert  ID necklace  If problems or questions, patient to contact team via:  [phone]  Future DSME appointment:  08-11-17

## 2017-07-28 ENCOUNTER — Ambulatory Visit
Admission: RE | Admit: 2017-07-28 | Discharge: 2017-07-28 | Disposition: A | Discharge status: Home / Self Care | Payer: Medicaid Other | Source: Ambulatory Visit | Attending: Internal Medicine | Admitting: Internal Medicine

## 2017-07-28 ENCOUNTER — Other Ambulatory Visit: Payer: Self-pay | Admitting: *Deleted

## 2017-07-28 ENCOUNTER — Telehealth: Payer: Self-pay | Admitting: Internal Medicine

## 2017-07-28 DIAGNOSIS — O99019 Anemia complicating pregnancy, unspecified trimester: Principal | ICD-10-CM

## 2017-07-28 DIAGNOSIS — Z3A00 Weeks of gestation of pregnancy not specified: Secondary | ICD-10-CM | POA: Insufficient documentation

## 2017-07-28 DIAGNOSIS — D509 Iron deficiency anemia, unspecified: Secondary | ICD-10-CM | POA: Insufficient documentation

## 2017-07-28 LAB — PREPARE RBC (CROSSMATCH)

## 2017-07-28 MED ORDER — ACETAMINOPHEN 325 MG PO TABS
ORAL_TABLET | ORAL | Status: AC
  Filled 2017-07-28: qty 2

## 2017-07-28 MED ORDER — SODIUM CHLORIDE 0.9 % IV SOLN
250.0000 mL | Freq: Once | INTRAVENOUS | Status: AC
  Administered 2017-07-28: 250 mL via INTRAVENOUS

## 2017-07-28 MED ORDER — DIPHENHYDRAMINE HCL 25 MG PO CAPS
25.0000 mg | ORAL_CAPSULE | Freq: Once | ORAL | Status: AC
  Administered 2017-07-28: 25 mg via ORAL

## 2017-07-28 MED ORDER — ACETAMINOPHEN 325 MG PO TABS
650.0000 mg | ORAL_TABLET | Freq: Once | ORAL | Status: AC
  Administered 2017-07-28: 650 mg via ORAL

## 2017-07-28 MED ORDER — DIPHENHYDRAMINE HCL 25 MG PO CAPS
ORAL_CAPSULE | ORAL | Status: AC
  Filled 2017-07-28: qty 1

## 2017-07-28 NOTE — Telephone Encounter (Signed)
Contacted patient with an apt. Patient declined apt in cancer center. She works at a school. She states that due to her schedule unable to receive any blood unit until after 3pm. She would be able to come today after 2pm. I explained to be able to receive blood in the outpatient cancer center the last apts for the day are around 1:30pm. She states that Dr. Cherry/Dr. B said that she could always get blood in [facility] to accomodates her schedule. I reassured patient that I would f/u on the details of this request.  I contacted Cindy in [place] and also blood bank. Patient may be accommodated after 2pm in [facility] today. Pt would need to arrive in medical mall at 2pm and pt would need to check in at registration first. Spoke with patient. She is agreeable. Blood orders entered. Per blood bank, she should be able to get blood ready for patient today as long as no antibodies are identified.

## 2017-07-28 NOTE — Telephone Encounter (Signed)
Colette, please schedule pt for 1 unit of blood tomorrow. Contact patient with apt.

## 2017-07-28 NOTE — Telephone Encounter (Signed)
Heather/Brooke- Please inform pt that her hemoglobin is 6.9; recommend 1 unit of PRBC transfusion. Follow up as planned next week.

## 2017-07-28 NOTE — Progress Notes (Signed)
Blood transfusion complete , pt. Tolerated well , IV left hand d/c"d . Pt discharged.

## 2017-07-28 NOTE — Progress Notes (Signed)
Pt has insulin pump 

## 2017-07-29 ENCOUNTER — Ambulatory Visit: Payer: Medicaid Other

## 2017-07-29 LAB — BPAM RBC
Blood Product Expiration Date: 201904192359
ISSUE DATE / TIME: 201904031431
Unit Type and Rh: 1700

## 2017-07-29 LAB — TYPE AND SCREEN
ABO/RH(D): B POS
Antibody Screen: NEGATIVE
Unit division: 0

## 2017-08-03 ENCOUNTER — Ambulatory Visit: Payer: Medicaid Other | Admitting: Internal Medicine

## 2017-08-03 ENCOUNTER — Other Ambulatory Visit: Payer: Medicaid Other

## 2017-08-04 ENCOUNTER — Inpatient Hospital Stay (HOSPITAL_BASED_OUTPATIENT_CLINIC_OR_DEPARTMENT_OTHER): Payer: Medicaid Other | Admitting: Internal Medicine

## 2017-08-04 ENCOUNTER — Inpatient Hospital Stay: Payer: Medicaid Other

## 2017-08-04 ENCOUNTER — Encounter: Payer: Medicaid Other | Admitting: Obstetrics and Gynecology

## 2017-08-04 ENCOUNTER — Other Ambulatory Visit: Payer: Self-pay | Admitting: *Deleted

## 2017-08-04 ENCOUNTER — Encounter: Payer: Self-pay | Admitting: Internal Medicine

## 2017-08-04 VITALS — BP 149/89 | HR 90 | Temp 97.1°F | Resp 16 | Wt 193.2 lb

## 2017-08-04 DIAGNOSIS — O99012 Anemia complicating pregnancy, second trimester: Secondary | ICD-10-CM

## 2017-08-04 DIAGNOSIS — D509 Iron deficiency anemia, unspecified: Secondary | ICD-10-CM

## 2017-08-04 DIAGNOSIS — E119 Type 2 diabetes mellitus without complications: Secondary | ICD-10-CM | POA: Diagnosis not present

## 2017-08-04 DIAGNOSIS — D589 Hereditary hemolytic anemia, unspecified: Secondary | ICD-10-CM

## 2017-08-04 DIAGNOSIS — O99019 Anemia complicating pregnancy, unspecified trimester: Principal | ICD-10-CM

## 2017-08-04 DIAGNOSIS — N189 Chronic kidney disease, unspecified: Secondary | ICD-10-CM

## 2017-08-04 DIAGNOSIS — D599 Acquired hemolytic anemia, unspecified: Secondary | ICD-10-CM | POA: Insufficient documentation

## 2017-08-04 LAB — CBC WITH DIFFERENTIAL/PLATELET
Basophils Absolute: 0.1 10*3/uL (ref 0–0.1)
Basophils Relative: 1 %
Eosinophils Absolute: 0.2 10*3/uL (ref 0–0.7)
Eosinophils Relative: 3 %
HCT: 19.7 % — ABNORMAL LOW (ref 35.0–47.0)
Hemoglobin: 7.1 g/dL — ABNORMAL LOW (ref 12.0–16.0)
Lymphocytes Relative: 13 %
Lymphs Abs: 1.1 10*3/uL (ref 1.0–3.6)
MCH: 30.1 pg (ref 26.0–34.0)
MCHC: 36 g/dL (ref 32.0–36.0)
MCV: 83.6 fL (ref 80.0–100.0)
Monocytes Absolute: 0.4 10*3/uL (ref 0.2–0.9)
Monocytes Relative: 5 %
Neutro Abs: 6.7 10*3/uL — ABNORMAL HIGH (ref 1.4–6.5)
Neutrophils Relative %: 78 %
Platelets: 172 10*3/uL (ref 150–440)
RBC: 2.35 MIL/uL — ABNORMAL LOW (ref 3.80–5.20)
RDW: 14.8 % — ABNORMAL HIGH (ref 11.5–14.5)
WBC: 8.5 10*3/uL (ref 3.6–11.0)

## 2017-08-04 LAB — SAMPLE TO BLOOD BANK

## 2017-08-04 LAB — LACTATE DEHYDROGENASE: LDH: 288 U/L — ABNORMAL HIGH (ref 98–192)

## 2017-08-04 MED ORDER — FOLIC ACID 1 MG PO TABS: 1.0000 mg | ORAL_TABLET | Freq: Every day | ORAL | 1 refills | Status: DC

## 2017-08-04 MED ORDER — PREDNISONE 20 MG PO TABS: ORAL_TABLET | ORAL | 0 refills | Status: DC

## 2017-08-04 NOTE — Assessment & Plan Note (Addendum)
#  Hemolytic anemia- [elevated LDH ~350; low haptoglobin]; DAT negative x2/PNH negative/no evidence of extravascular hemolysis.    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  I recommend prednisone 60 mg a day times 2 weeks; and taper based upon hemoglobin response.  Recommend adding folic acid 1 mg a day.  #Discussed the multiple side effects-elevated blood sugars [my biggest concern]; recommend calling her endocrinologist prior to starting the steroids [patient has appointment on April 12].  Also discussed regarding fluid retention/elevated blood pressure/anxiety. However given the risks versus benefits-I think is reasonable to give a trial of prednisone.  # ? CKD/AKI-followed by Dr. Candiss Norse.  # IDDM [Dr.Kerr; [facility] physcian; ]-see discussion above.  I had spoke with Dr. Buddy Duty my plan for steroids previously.   # follow up in 3 weeks/ hold tube; weekly cbc/hold tube.   CC;Dr.Singh; Dr.Cherry; Dr.Kerr; Jacqulynn Cadet; Dr. Diamantina Monks

## 2017-08-04 NOTE — Patient Instructions (Signed)
#   Watch out for blood sugars while on prednisone; please inform your endocrinologist prior to starting prednisone-to help adjust her insulin

## 2017-08-04 NOTE — Progress Notes (Signed)
[place] [facility] [place] NOTE  Patient Care Team: Lin Landsman, MD as PCP - General (Family Medicine)  CHIEF COMPLAINTS/PURPOSE OF CONSULTATION: Anemia  # Severe anemia [nadir 5.8- Feb 2019]; HEMOLYTIC [? Auto-immune; DAT x2 NEG];   # March 2019- [redacted] weeks Pregnant [Dr.Cherry/Ob]  # AKI/CKD [dec 2018; Dr.Singh]; IDDM [since age of 41- Dr.Kerr,/Endo]    No history exists.     HISTORY OF PRESENTING ILLNESS:  Kelly Huff 32 y.o.  female with long-standing history of diabetes insulin-dependent/chronic kidney disease/and also currently pregnant [starting 14th week April 12th]-is here for follow-up of her anemia.  Patient was diagnosed with hemolytic anemia at last visit.  I recommended starting prednisone-however after lengthy discussion patient declined starting medication given the concerns of side effects.  Patient had at least 2 blood transfusions in the last 1 month.  Her last blood transfusion was approximately a week ago.   Patient continues to complain of mild shortness of breath; overall energy levels improved.  Patient denies any unusual swelling of the legs.  No nausea no vomiting.   ROS: A complete 10 point review of system is done which is negative except mentioned above in history of present illness  MEDICAL HISTORY:  Past Medical History:  Diagnosis Date  . Chronic kidney disease   . Diabetes mellitus without complication ([place])   . History of pre-eclampsia 2016   mild  . Hypertension     SURGICAL HISTORY: Past Surgical History:  Procedure Laterality Date  . DILATION AND CURETTAGE OF UTERUS     x 4    SOCIAL HISTORY: Social History   Socioeconomic History  . Marital status: Single    Spouse name: Not on file  . Number of children: Not on file  . Years of education: Not on file  . Highest education level: Not on file  Occupational History  . Not on file  Social Needs  . Financial resource strain: Not on file  . Food insecurity:     Worry: Not on file    Inability: Not on file  . Transportation needs:    Medical: Not on file    Non-medical: Not on file  Tobacco Use  . Smoking status: Never Smoker  . Smokeless tobacco: Never Used  Substance and Sexual Activity  . Alcohol use: No  . Drug use: No  . Sexual activity: Yes    Birth control/protection: None  Lifestyle  . Physical activity:    Days per week: Not on file    Minutes per session: Not on file  . Stress: Not on file  Relationships  . Social connections:    Talks on phone: Not on file    Gets together: Not on file    Attends religious service: Not on file    Active member of club or organization: Not on file    Attends meetings of clubs or organizations: Not on file    Relationship status: Not on file  . Intimate partner violence:    Fear of current or ex partner: Not on file    Emotionally abused: Not on file    Physically abused: Not on file    Forced sexual activity: Not on file  Other Topics Concern  . Not on file  Social History Narrative  . Not on file    FAMILY HISTORY: Family History  Problem Relation Age of Onset  . Hypertension Mother   . Cancer Mother        Uterine vs cervical (pt  unsure),   . Schizophrenia Brother     ALLERGIES:  is allergic to morphine; sulfamethoxazole-trimethoprim; and trulicity [dulaglutide].  MEDICATIONS:  Current Outpatient Medications  Medication Sig Dispense Refill  . aspirin EC 81 MG tablet Take 1 tablet (81 mg total) by mouth daily. Take after 12 weeks for prevention of preeclampssia later in pregnancy 300 tablet 2  . NIFEdipine (PROCARDIA-XL/ADALAT-CC/NIFEDICAL-XL) 30 MG 24 hr tablet Take 2 tablets (60 mg total) by mouth daily. (Patient taking differently: Take 30 mg by mouth 2 (two) times daily. ) 30 tablet 0  . NOVOLOG FLEXPEN 100 UNIT/ML FlexPen 0-9 units injected 3 times daily with meals according to the following scale: CBG 70-120 = 0 units CBG 121-150 = 1 unit CBG 151-200 = 2 units CBG  201-250 = 3 units CBG 251-300 = 5 units CBG 301-350 = 7 units CBG 351-400 = 9 units CBG >400 = call MD for advice 15 mL 0  . ondansetron (ZOFRAN ODT) 4 MG disintegrating tablet Take 1 tablet (4 mg total) by mouth every 8 (eight) hours as needed for nausea. 30 tablet 2  . Prenatal Vit-Fe Fumarate-FA (MULTIVITAMIN-PRENATAL) 27-0.8 MG TABS tablet Take 1 tablet by mouth daily at 12 noon.    . cyclobenzaprine (FLEXERIL) 10 MG tablet Take 10 mg by mouth 3 (three) times daily as needed for muscle spasms.    . folic acid (FOLVITE) 1 MG tablet Take 1 tablet (1 mg total) by mouth daily. 90 tablet 1  . meclizine (ANTIVERT) 25 MG tablet Take 1 tablet (25 mg total) by mouth 3 (three) times daily as needed. (Patient not taking: Reported on 07/27/2017) 30 tablet 1  . predniSONE (DELTASONE) 20 MG tablet Take 3 pills once a day x 2 weeks; and will taper based on response to hemoglobin. 80 tablet 0   No current facility-administered medications for this visit.       Marland Kitchen  PHYSICAL EXAMINATION: ECOG PERFORMANCE STATUS: 1 - Symptomatic but completely ambulatory  Vitals:   08/04/17 1405 08/04/17 1408  BP:  (!) 149/89  Pulse:  90  Resp: 16 16  Temp: 98.1 F (36.7 C) (!) 97.1 F (36.2 C)   Filed Weights   08/04/17 1405  Weight: 193 lb 3.2 oz (87.6 kg)    GENERAL: Well-nourished well-developed; Alert, no distress and comfortable.   Alone.  EYES: positive for pallor; No icterus OROPHARYNX: no thrush or ulceration; good dentition  NECK: supple, no masses felt LYMPH:  no palpable lymphadenopathy in the cervical, axillary or inguinal regions LUNGS: clear to auscultation and  No wheeze or crackles HEART/CVS: regular rate & rhythm and no murmurs; No lower extremity edema ABDOMEN: abdomen soft, non-tender and normal bowel sounds Musculoskeletal:no cyanosis of digits and no clubbing  PSYCH: alert & oriented x 3 with fluent speech NEURO: no focal motor/sensory deficits SKIN:  no rashes or significant  lesions  LABORATORY DATA:  I have reviewed the data as listed Lab Results  Component Value Date   WBC 8.5 08/04/2017   HGB 7.1 (L) 08/04/2017   HCT 19.7 (L) 08/04/2017   MCV 83.6 08/04/2017   PLT 172 08/04/2017   Recent Labs    05/04/17 1923 06/29/17 1701 07/03/17 1303 07/07/17 1210  NA 133* 133* 132* 135  K 4.1 4.7 4.2 3.7  CL 104 103 105 106  CO2 22 19* 20* 19*  GLUCOSE 134* 217* 150* 46*  BUN 30* 28* 25* 24*  CREATININE 1.94* 2.19* 2.20* 2.01*  CALCIUM 8.6* 8.3* 8.3* 8.5*  GFRNONAA 33* 29* 28* 31*  GFRAA 38* 33* 33* 36*  PROT 6.8 6.1 6.8  --   ALBUMIN 3.1* 3.1* 2.9* 3.0*  AST 23 11 20   --   ALT 16 8 9*  --   ALKPHOS 118 102 92  --   BILITOT 0.6 0.3 0.7  --   BILIDIR  --   --  <0.1*  --   IBILI  --   --  NOT CALCULATED  --     RADIOGRAPHIC STUDIES: I have personally reviewed the radiological images as listed and agreed with the findings in the report. US Renal Artery Duplex Complete  Result Date: 07/14/2017 CLINICAL DATA:  Primary hypertension EXAM: RENAL DUPLEX DOPPLER ULTRASOUND COMPARISON:  None. FINDINGS: Right Kidney: Length: 11.4 cm. Echogenicity within normal limits. No mass or hydronephrosis visualized. Left Kidney: Length: 12.3 cm. Echogenicity within normal limits. No mass or hydronephrosis visualized. Bladder:  Within normal limits. RENAL DUPLEX ULTRASOUND Right Renal Artery Velocities: Origin:  172 cm/sec Mid:  115 cm/sec [place]:  133 cm/sec Interlobar:  73 cm/sec Arcuate:  25 cm/sec Left Renal Artery Velocities: Origin:  103 cm/sec Mid:  131 cm/sec [place]:  100 cm/sec Interlobar:  68 cm/sec Arcuate:  31 cm/sec Aortic Velocity:  147 cm/sec Right Renal-Aortic Ratios: Origin: 1.2 Mid:  0.8 [place]: 0.9 Interlobar: 0.5 Arcuate: 0.2 Left Renal-Aortic Ratios: Origin: 0.7 Mid: 0.9 Hilum: 0.7 Interlobar: 0.5 Arcuate: 0.2 Renal veins are patent bilaterally. Doppler analysis demonstrates low resistance arterial waveforms bilaterally. IMPRESSION: No evidence of significant  renal artery stenosis. Electronically Signed   By: Marybelle Killings M.D.   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  I recommend prednisone 60 mg a day times 2 weeks; and taper based upon hemoglobin response.  Recommend adding folic acid 1 mg a day.  #Discussed the multiple side effects-elevated blood sugars [my biggest concern]; recommend calling her endocrinologist prior to starting the steroids [patient has appointment on April 12].  Also discussed regarding fluid retention/elevated blood pressure/anxiety. However given the risks versus benefits-I think is reasonable to give a trial of prednisone.  # ? CKD/AKI-followed by Dr. Candiss Norse.  # IDDM [Dr.Kerr; [facility] physcian; ]-see discussion above.  I had spoke with Dr. Buddy Duty my plan for steroids previously.   # follow up in 3 weeks/ hold tube; weekly cbc/hold tube.   CC;Dr.Singh; Dr.Cherry; Dr.Kerr; Jacqulynn Cadet; Dr. Diamantina Monks   All questions were answered. The patient knows to call the clinic with any problems, questions or concerns.    Cammie Sickle, MD 08/04/2017 4:13 PM

## 2017-08-05 ENCOUNTER — Other Ambulatory Visit: Payer: Self-pay

## 2017-08-05 ENCOUNTER — Other Ambulatory Visit: Payer: Medicaid Other

## 2017-08-05 ENCOUNTER — Ambulatory Visit (INDEPENDENT_AMBULATORY_CARE_PROVIDER_SITE_OTHER): Payer: Medicaid Other | Admitting: Obstetrics and Gynecology

## 2017-08-05 ENCOUNTER — Encounter: Payer: Self-pay | Admitting: Obstetrics and Gynecology

## 2017-08-05 VITALS — BP 156/90 | HR 108 | Wt 195.2 lb

## 2017-08-05 DIAGNOSIS — O99891 Other specified diseases and conditions complicating pregnancy: Secondary | ICD-10-CM

## 2017-08-05 DIAGNOSIS — O0992 Supervision of high risk pregnancy, unspecified, second trimester: Secondary | ICD-10-CM

## 2017-08-05 DIAGNOSIS — D631 Anemia in chronic kidney disease: Secondary | ICD-10-CM

## 2017-08-05 DIAGNOSIS — O09291 Supervision of pregnancy with other poor reproductive or obstetric history, first trimester: Secondary | ICD-10-CM

## 2017-08-05 DIAGNOSIS — N189 Chronic kidney disease, unspecified: Secondary | ICD-10-CM

## 2017-08-05 DIAGNOSIS — O10911 Unspecified pre-existing hypertension complicating pregnancy, first trimester: Secondary | ICD-10-CM

## 2017-08-05 DIAGNOSIS — R7989 Other specified abnormal findings of blood chemistry: Secondary | ICD-10-CM

## 2017-08-05 DIAGNOSIS — O9989 Other specified diseases and conditions complicating pregnancy, childbirth and the puerperium: Secondary | ICD-10-CM

## 2017-08-05 DIAGNOSIS — R8271 Bacteriuria: Secondary | ICD-10-CM

## 2017-08-05 LAB — POCT URINALYSIS DIPSTICK
Bilirubin, UA: NEGATIVE
Ketones, UA: NEGATIVE
Leukocytes, UA: NEGATIVE
Nitrite, UA: NEGATIVE
Spec Grav, UA: 1.015 (ref 1.010–1.025)
Urobilinogen, UA: 0.2 E.U./dL
pH, UA: 6 (ref 5.0–8.0)

## 2017-08-05 NOTE — Progress Notes (Signed)
ROB-pt is having some cramping.

## 2017-08-05 NOTE — Progress Notes (Signed)
ROB: Patient got steroid injections and is getting them daily.  This likely causing significant glucosuria.  BUN/creatinine TSH today.  Move [facility] referral to sooner date.  Urine culture today.  afp next visit

## 2017-08-06 LAB — COMPREHENSIVE METABOLIC PANEL
ALT: 10 IU/L (ref 0–32)
AST: 18 IU/L (ref 0–40)
Albumin/Globulin Ratio: 1 — ABNORMAL LOW (ref 1.2–2.2)
Albumin: 3.2 g/dL — ABNORMAL LOW (ref 3.5–5.5)
Alkaline Phosphatase: 121 IU/L — ABNORMAL HIGH (ref 39–117)
BUN/Creatinine Ratio: 15 (ref 9–23)
BUN: 36 mg/dL — ABNORMAL HIGH (ref 6–20)
Bilirubin Total: <0.2 mg/dL (ref 0.0–1.2)
CO2: 16 mmol/L — ABNORMAL LOW (ref 20–29)
Calcium: 8.7 mg/dL (ref 8.7–10.2)
Chloride: 100 mmol/L (ref 96–106)
Creatinine, Ser: 2.41 mg/dL — ABNORMAL HIGH (ref 0.57–1.00)
GFR calc Af Amer: 30 mL/min/{1.73_m2} — ABNORMAL LOW (ref >59)
GFR calc non Af Amer: 26 mL/min/{1.73_m2} — ABNORMAL LOW (ref >59)
Globulin, Total: 3.1 g/dL (ref 1.5–4.5)
Glucose: 216 mg/dL — ABNORMAL HIGH (ref 65–99)
Potassium: 4.5 mmol/L (ref 3.5–5.2)
Sodium: 131 mmol/L — ABNORMAL LOW (ref 134–144)
Total Protein: 6.3 g/dL (ref 6.0–8.5)

## 2017-08-06 LAB — THYROID PANEL WITH TSH
Free Thyroxine Index: 3.2 (ref 1.2–4.9)
T3 Uptake Ratio: 20 % — ABNORMAL LOW (ref 24–39)
T4, Total: 16.1 ug/dL — ABNORMAL HIGH (ref 4.5–12.0)
TSH: 0.044 u[IU]/mL — ABNORMAL LOW (ref 0.450–4.500)

## 2017-08-11 ENCOUNTER — Ambulatory Visit: Payer: Medicaid Other | Admitting: Dietician

## 2017-08-11 ENCOUNTER — Inpatient Hospital Stay: Payer: Medicaid Other

## 2017-08-11 DIAGNOSIS — O99019 Anemia complicating pregnancy, unspecified trimester: Principal | ICD-10-CM

## 2017-08-11 DIAGNOSIS — O99012 Anemia complicating pregnancy, second trimester: Secondary | ICD-10-CM | POA: Diagnosis not present

## 2017-08-11 DIAGNOSIS — D509 Iron deficiency anemia, unspecified: Secondary | ICD-10-CM

## 2017-08-11 LAB — CBC WITH DIFFERENTIAL/PLATELET
Basophils Absolute: 0 10*3/uL (ref 0–0.1)
Basophils Relative: 0 %
Eosinophils Absolute: 0 10*3/uL (ref 0–0.7)
Eosinophils Relative: 0 %
HCT: 21 % — ABNORMAL LOW (ref 35.0–47.0)
Hemoglobin: 7.5 g/dL — ABNORMAL LOW (ref 12.0–16.0)
Lymphocytes Relative: 4 %
Lymphs Abs: 0.9 10*3/uL — ABNORMAL LOW (ref 1.0–3.6)
MCH: 30.2 pg (ref 26.0–34.0)
MCHC: 35.5 g/dL (ref 32.0–36.0)
MCV: 85.2 fL (ref 80.0–100.0)
Monocytes Absolute: 1 10*3/uL — ABNORMAL HIGH (ref 0.2–0.9)
Monocytes Relative: 4 %
Neutro Abs: 21.8 10*3/uL — ABNORMAL HIGH (ref 1.4–6.5)
Neutrophils Relative %: 92 %
Platelets: 218 10*3/uL (ref 150–440)
RBC: 2.47 MIL/uL — ABNORMAL LOW (ref 3.80–5.20)
RDW: 15.3 % — ABNORMAL HIGH (ref 11.5–14.5)
WBC: 23.8 10*3/uL — ABNORMAL HIGH (ref 3.6–11.0)

## 2017-08-11 LAB — LACTATE DEHYDROGENASE: LDH: 454 U/L — ABNORMAL HIGH (ref 98–192)

## 2017-08-11 LAB — SAMPLE TO BLOOD BANK

## 2017-08-12 ENCOUNTER — Observation Stay
Admission: EM | Admit: 2017-08-12 | Discharge: 2017-08-12 | Disposition: A | Discharge status: Home / Self Care | Payer: Medicaid Other | Attending: Obstetrics and Gynecology | Admitting: Obstetrics and Gynecology

## 2017-08-12 ENCOUNTER — Telehealth: Payer: Self-pay | Admitting: Internal Medicine

## 2017-08-12 ENCOUNTER — Encounter: Payer: Self-pay | Admitting: Obstetrics and Gynecology

## 2017-08-12 ENCOUNTER — Telehealth: Payer: Self-pay | Admitting: *Deleted

## 2017-08-12 DIAGNOSIS — Z3A15 15 weeks gestation of pregnancy: Secondary | ICD-10-CM

## 2017-08-12 DIAGNOSIS — Z79899 Other long term (current) drug therapy: Secondary | ICD-10-CM | POA: Diagnosis not present

## 2017-08-12 DIAGNOSIS — Z3A14 14 weeks gestation of pregnancy: Secondary | ICD-10-CM | POA: Diagnosis not present

## 2017-08-12 DIAGNOSIS — Z7982 Long term (current) use of aspirin: Secondary | ICD-10-CM | POA: Insufficient documentation

## 2017-08-12 DIAGNOSIS — O162 Unspecified maternal hypertension, second trimester: Secondary | ICD-10-CM | POA: Diagnosis present

## 2017-08-12 MED ORDER — LABETALOL HCL 100 MG PO TABS
200.0000 mg | ORAL_TABLET | Freq: Once | ORAL | Status: AC
  Administered 2017-08-12: 200 mg via ORAL

## 2017-08-12 MED ORDER — LABETALOL HCL 100 MG PO TABS
ORAL_TABLET | ORAL | Status: AC
  Filled 2017-08-12: qty 2

## 2017-08-12 NOTE — Telephone Encounter (Signed)
FYI - I spoke to patient regarding Hb- 7.5; slightly improved from 7.1; although LDH is slightly elevated at 450.  Continue taking prednisone 60 mg a day.   #Leg swelling recommend leg elevation while resting/compression stockings.  #Keep lab appointments as planned

## 2017-08-12 NOTE — Telephone Encounter (Signed)
md attempted to reach patient - no answer. md left msg.

## 2017-08-12 NOTE — Telephone Encounter (Signed)
Called pt/to discuss results of the blood work.  Left a voicemail to call us back

## 2017-08-12 NOTE — Progress Notes (Signed)
Marya Amsler RN, charge nurse in ED notified of patient's arrival to [facility], elevated BP's treated with PO Labetalol and plan to send patient to ED to be evaluated further.

## 2017-08-12 NOTE — OB Triage Note (Signed)
Pt wheeled down to ED lobby to be seen by ED.

## 2017-08-12 NOTE — Progress Notes (Signed)
Dr. Amalia Hailey notified of patient's arrival in triage stating she was told by the doctor's office to "come to the hospital" for elevated blood pressures.  Patient arrived to the floor without going through the ED. RN's unaware of gestational age of 14w 6d upon arrival. Once it was apparent that she was less than 20 weeks, doppler heart tones obtained as charted.  BP's elevated, meeting criteria for treatment. Orders received for PO labetalol, then to send patient to the ED for further evaluation.

## 2017-08-12 NOTE — Telephone Encounter (Signed)
-----   Message from Secundino Ginger sent at 08/12/2017 12:02 PM EDT ----- Regarding: lab results Contact: [phone] Patient called for lab results. Will you call her please.

## 2017-08-13 ENCOUNTER — Other Ambulatory Visit: Payer: Self-pay

## 2017-08-13 ENCOUNTER — Telehealth: Payer: Self-pay | Admitting: *Deleted

## 2017-08-13 ENCOUNTER — Observation Stay
Admission: EM | Admit: 2017-08-13 | Discharge: 2017-08-16 | Disposition: A | Discharge status: Home / Self Care | Payer: Medicaid Other | Attending: Obstetrics and Gynecology | Admitting: Obstetrics and Gynecology

## 2017-08-13 DIAGNOSIS — N189 Chronic kidney disease, unspecified: Secondary | ICD-10-CM | POA: Diagnosis not present

## 2017-08-13 DIAGNOSIS — D631 Anemia in chronic kidney disease: Secondary | ICD-10-CM | POA: Diagnosis not present

## 2017-08-13 DIAGNOSIS — E103219 Type 1 diabetes mellitus with mild nonproliferative diabetic retinopathy with macular edema, unspecified eye: Secondary | ICD-10-CM | POA: Diagnosis not present

## 2017-08-13 DIAGNOSIS — O10212 Pre-existing hypertensive chronic kidney disease complicating pregnancy, second trimester: Secondary | ICD-10-CM | POA: Diagnosis not present

## 2017-08-13 DIAGNOSIS — R748 Abnormal levels of other serum enzymes: Secondary | ICD-10-CM | POA: Insufficient documentation

## 2017-08-13 DIAGNOSIS — R778 Other specified abnormalities of plasma proteins: Secondary | ICD-10-CM

## 2017-08-13 DIAGNOSIS — O1202 Gestational edema, second trimester: Secondary | ICD-10-CM | POA: Insufficient documentation

## 2017-08-13 DIAGNOSIS — E1022 Type 1 diabetes mellitus with diabetic chronic kidney disease: Secondary | ICD-10-CM | POA: Insufficient documentation

## 2017-08-13 DIAGNOSIS — O24912 Unspecified diabetes mellitus in pregnancy, second trimester: Secondary | ICD-10-CM | POA: Insufficient documentation

## 2017-08-13 DIAGNOSIS — N183 Chronic kidney disease, stage 3 unspecified: Secondary | ICD-10-CM | POA: Diagnosis present

## 2017-08-13 DIAGNOSIS — O99412 Diseases of the circulatory system complicating pregnancy, second trimester: Secondary | ICD-10-CM | POA: Diagnosis not present

## 2017-08-13 DIAGNOSIS — D72829 Elevated white blood cell count, unspecified: Secondary | ICD-10-CM | POA: Insufficient documentation

## 2017-08-13 DIAGNOSIS — O99012 Anemia complicating pregnancy, second trimester: Secondary | ICD-10-CM | POA: Diagnosis not present

## 2017-08-13 DIAGNOSIS — I10 Essential (primary) hypertension: Secondary | ICD-10-CM | POA: Diagnosis present

## 2017-08-13 DIAGNOSIS — O10919 Unspecified pre-existing hypertension complicating pregnancy, unspecified trimester: Secondary | ICD-10-CM | POA: Diagnosis present

## 2017-08-13 DIAGNOSIS — D599 Acquired hemolytic anemia, unspecified: Secondary | ICD-10-CM | POA: Diagnosis present

## 2017-08-13 DIAGNOSIS — O10912 Unspecified pre-existing hypertension complicating pregnancy, second trimester: Secondary | ICD-10-CM

## 2017-08-13 DIAGNOSIS — Z3A15 15 weeks gestation of pregnancy: Secondary | ICD-10-CM | POA: Diagnosis not present

## 2017-08-13 DIAGNOSIS — R7989 Other specified abnormal findings of blood chemistry: Secondary | ICD-10-CM | POA: Diagnosis present

## 2017-08-13 DIAGNOSIS — R000 Tachycardia, unspecified: Secondary | ICD-10-CM | POA: Insufficient documentation

## 2017-08-13 DIAGNOSIS — O99019 Anemia complicating pregnancy, unspecified trimester: Secondary | ICD-10-CM

## 2017-08-13 DIAGNOSIS — O99119 Other diseases of the blood and blood-forming organs and certain disorders involving the immune mechanism complicating pregnancy, unspecified trimester: Secondary | ICD-10-CM | POA: Insufficient documentation

## 2017-08-13 DIAGNOSIS — Z794 Long term (current) use of insulin: Secondary | ICD-10-CM | POA: Insufficient documentation

## 2017-08-13 DIAGNOSIS — D509 Iron deficiency anemia, unspecified: Secondary | ICD-10-CM | POA: Insufficient documentation

## 2017-08-13 DIAGNOSIS — E10649 Type 1 diabetes mellitus with hypoglycemia without coma: Secondary | ICD-10-CM | POA: Diagnosis not present

## 2017-08-13 DIAGNOSIS — D649 Anemia, unspecified: Secondary | ICD-10-CM | POA: Diagnosis not present

## 2017-08-13 DIAGNOSIS — R609 Edema, unspecified: Secondary | ICD-10-CM

## 2017-08-13 DIAGNOSIS — O24012 Pre-existing diabetes mellitus, type 1, in pregnancy, second trimester: Secondary | ICD-10-CM | POA: Diagnosis not present

## 2017-08-13 DIAGNOSIS — I340 Nonrheumatic mitral (valve) insufficiency: Secondary | ICD-10-CM | POA: Diagnosis not present

## 2017-08-13 LAB — URINALYSIS, COMPLETE (UACMP) WITH MICROSCOPIC
Bilirubin Urine: NEGATIVE
Glucose, UA: 150 mg/dL — AB
Ketones, ur: NEGATIVE mg/dL
Leukocytes, UA: NEGATIVE
Nitrite: NEGATIVE
Protein, ur: >=300 mg/dL — AB
Specific Gravity, Urine: 1.014 (ref 1.005–1.030)
pH: 5 (ref 5.0–8.0)

## 2017-08-13 LAB — CBC
HCT: 20.7 % — ABNORMAL LOW (ref 35.0–47.0)
Hemoglobin: 7 g/dL — ABNORMAL LOW (ref 12.0–16.0)
MCH: 29.6 pg (ref 26.0–34.0)
MCHC: 33.9 g/dL (ref 32.0–36.0)
MCV: 87.3 fL (ref 80.0–100.0)
Platelets: 186 10*3/uL (ref 150–440)
RBC: 2.38 MIL/uL — ABNORMAL LOW (ref 3.80–5.20)
RDW: 16.6 % — ABNORMAL HIGH (ref 11.5–14.5)
WBC: 23.5 10*3/uL — ABNORMAL HIGH (ref 3.6–11.0)

## 2017-08-13 LAB — COMPREHENSIVE METABOLIC PANEL
ALT: 19 U/L (ref 14–54)
AST: 26 U/L (ref 15–41)
Albumin: 2.6 g/dL — ABNORMAL LOW (ref 3.5–5.0)
Alkaline Phosphatase: 78 U/L (ref 38–126)
Anion gap: 7 (ref 5–15)
BUN: 53 mg/dL — ABNORMAL HIGH (ref 6–20)
CO2: 17 mmol/L — ABNORMAL LOW (ref 22–32)
Calcium: 8.2 mg/dL — ABNORMAL LOW (ref 8.9–10.3)
Chloride: 110 mmol/L (ref 101–111)
Creatinine, Ser: 2.1 mg/dL — ABNORMAL HIGH (ref 0.44–1.00)
GFR calc Af Amer: 35 mL/min — ABNORMAL LOW (ref >60)
GFR calc non Af Amer: 30 mL/min — ABNORMAL LOW (ref >60)
Glucose, Bld: 133 mg/dL — ABNORMAL HIGH (ref 65–99)
Potassium: 4 mmol/L (ref 3.5–5.1)
Sodium: 134 mmol/L — ABNORMAL LOW (ref 135–145)
Total Bilirubin: 0.4 mg/dL (ref 0.3–1.2)
Total Protein: 6.1 g/dL — ABNORMAL LOW (ref 6.5–8.1)

## 2017-08-13 LAB — TROPONIN I
Troponin I: 0.22 ng/mL (ref <0.03)
Troponin I: 0.15 ng/mL (ref <0.03)

## 2017-08-13 LAB — GLUCOSE, CAPILLARY: Glucose-Capillary: 144 mg/dL — ABNORMAL HIGH (ref 65–99)

## 2017-08-13 LAB — PREPARE RBC (CROSSMATCH)

## 2017-08-13 MED ORDER — INSULIN ASPART 100 UNIT/ML FLEXPEN: 67.0000 [IU] | PEN_INJECTOR | Freq: Every day | SUBCUTANEOUS | Status: DC

## 2017-08-13 MED ORDER — FOLIC ACID 1 MG PO TABS
1.0000 mg | ORAL_TABLET | Freq: Every day | ORAL | Status: DC
  Administered 2017-08-14 – 2017-08-16 (×3): 1 mg via ORAL
  Filled 2017-08-13 (×4): qty 1

## 2017-08-13 MED ORDER — INSULIN ASPART 100 UNIT/ML ~~LOC~~ SOLN
0.0000 [IU] | Freq: Three times a day (TID) | SUBCUTANEOUS | Status: DC
  Administered 2017-08-15: 9 [IU] via SUBCUTANEOUS
  Filled 2017-08-13: qty 1

## 2017-08-13 MED ORDER — LABETALOL HCL 5 MG/ML IV SOLN
10.0000 mg | INTRAVENOUS | Status: DC | PRN
  Filled 2017-08-13: qty 4

## 2017-08-13 MED ORDER — ONDANSETRON 4 MG PO TBDP
4.0000 mg | ORAL_TABLET | Freq: Three times a day (TID) | ORAL | Status: DC | PRN
  Filled 2017-08-13: qty 1

## 2017-08-13 MED ORDER — PREDNISONE 20 MG PO TABS
40.0000 mg | ORAL_TABLET | Freq: Every day | ORAL | Status: DC
  Filled 2017-08-13: qty 2

## 2017-08-13 MED ORDER — NIFEDIPINE ER 30 MG PO TB24
30.0000 mg | ORAL_TABLET | Freq: Two times a day (BID) | ORAL | Status: DC
  Administered 2017-08-14 – 2017-08-16 (×5): 30 mg via ORAL
  Filled 2017-08-13 (×11): qty 1

## 2017-08-13 MED ORDER — ZOLPIDEM TARTRATE 5 MG PO TABS: 5.0000 mg | ORAL_TABLET | Freq: Every evening | ORAL | Status: DC | PRN

## 2017-08-13 MED ORDER — ASPIRIN EC 81 MG PO TBEC
81.0000 mg | DELAYED_RELEASE_TABLET | Freq: Every day | ORAL | Status: DC
  Administered 2017-08-14 – 2017-08-16 (×3): 81 mg via ORAL
  Filled 2017-08-13 (×4): qty 1

## 2017-08-13 MED ORDER — PRENATAL MULTIVITAMIN CH
1.0000 | ORAL_TABLET | Freq: Every day | ORAL | Status: DC
  Administered 2017-08-14 – 2017-08-16 (×2): 1 via ORAL
  Filled 2017-08-13 (×3): qty 1

## 2017-08-13 MED ORDER — INSULIN PUMP
SUBCUTANEOUS | Status: AC
  Administered 2017-08-13: via SUBCUTANEOUS
  Filled 2017-08-13: qty 1

## 2017-08-13 MED ORDER — PREDNISONE 20 MG PO TABS
20.0000 mg | ORAL_TABLET | Freq: Two times a day (BID) | ORAL | Status: DC
  Administered 2017-08-13 – 2017-08-16 (×6): 20 mg via ORAL
  Filled 2017-08-13 (×6): qty 1

## 2017-08-13 MED ORDER — SODIUM CHLORIDE 0.9 % IV SOLN
Freq: Once | INTRAVENOUS | Status: AC
  Administered 2017-08-13: 22:00:00 via INTRAVENOUS

## 2017-08-13 MED ORDER — METHYLDOPA 250 MG PO TABS
250.0000 mg | ORAL_TABLET | Freq: Two times a day (BID) | ORAL | Status: DC
  Administered 2017-08-13: 250 mg via ORAL
  Filled 2017-08-13: qty 1

## 2017-08-13 MED ORDER — DOCUSATE SODIUM 100 MG PO CAPS
100.0000 mg | ORAL_CAPSULE | Freq: Every day | ORAL | Status: DC
  Administered 2017-08-14 – 2017-08-16 (×3): 100 mg via ORAL
  Filled 2017-08-13 (×3): qty 1

## 2017-08-13 MED ORDER — INSULIN ASPART 100 UNIT/ML ~~LOC~~ SOLN
0.0000 [IU] | Freq: Every day | SUBCUTANEOUS | Status: DC
  Administered 2017-08-15: 4 [IU] via SUBCUTANEOUS
  Filled 2017-08-13: qty 1

## 2017-08-13 MED ORDER — CALCIUM CARBONATE ANTACID 500 MG PO CHEW
2.0000 | CHEWABLE_TABLET | ORAL | Status: DC | PRN
  Filled 2017-08-13: qty 2

## 2017-08-13 MED ORDER — LABETALOL HCL 200 MG PO TABS
200.0000 mg | ORAL_TABLET | Freq: Two times a day (BID) | ORAL | Status: DC
  Administered 2017-08-13 – 2017-08-14 (×3): 200 mg via ORAL
  Filled 2017-08-13 (×4): qty 1

## 2017-08-13 MED ORDER — ACETAMINOPHEN 325 MG PO TABS
650.0000 mg | ORAL_TABLET | Freq: Four times a day (QID) | ORAL | Status: DC | PRN
  Filled 2017-08-13: qty 2

## 2017-08-13 NOTE — Telephone Encounter (Signed)
Called Dr Janese Banks early this AM stating that the prednisone is giving her palpatations and increased bp. Per VO Dr Janese Banks after discussing with Dr Rogue Bussing  Patient is to reduce prednisone to 40 mg daily. I attempted to call patient with these directions and had to leave a voice mail for her to return my call and I also left the directions t o reduce her dose to 40 mg (22 tabs) per day

## 2017-08-13 NOTE — ED Provider Notes (Addendum)
[facility] Emergency Department Provider Note  Time seen: 5:27 PM  I have reviewed the triage vital signs and the nursing notes.   HISTORY  Chief Complaint Hypertension    HPI Kelly Huff is a 32 y.o. female with a past medical history of CKD, diabetes, hypertension, now [redacted] weeks pregnant presents to the emergency department for worsening hypertension, anemia and lower extremity swelling.  According to the patient she is approximately [redacted] weeks pregnant, this is her fifth pregnancy she has had 2 children as well as 2 abortions.  States with her last pregnancy she had issues with lower extremity swelling and high blood pressure very late in the pregnancy but no issues early in the pregnancy.  States she has been anemic all of her life but has never required blood transfusion until this pregnancy she has now had 3 blood transfusions during this pregnancy.  Patient is currently taking prednisone, is diabetic using an insulin pump but states her blood sugars have been well controlled.  [facility] but has recently been referred to [facility] high risk but is not yet had her first appointment.  Patient states she is taking her medications as prescribed but continues to have significant hypertension currently 189/109, is tachycardic around 115-130 and is expensing lower extremity edema.  Patient denies any chest pain.  Denies any shortness of breath.  Overall appears well, calm, cooperative, no distress largely negative review of systems.   Past Medical History:  Diagnosis Date  . Chronic kidney disease   . Diabetes mellitus without complication ([place])   . History of pre-eclampsia 2016   mild  . Hypertension     Patient Active Problem List   Diagnosis Date Noted  . Labor and delivery, indication for care 08/12/2017  . 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 Iron deficiency anemia during pregnancy 07/06/2017  . History of pre-eclampsia in  prior pregnancy, currently pregnant in first trimester 06/12/2017  . Diabetes ([place]) 04/13/2017  . Acute renal failure ([place])   . Dehydration   . Other nonspecific abnormal finding 01/29/2016  . Family history of BRCA gene positive 07/13/2013  . 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 DILATION AND CURETTAGE OF UTERUS     x 4    Prior to Admission medications   Medication Sig Start Date End Date Taking? Authorizing Provider  aspirin EC 81 MG tablet Take 1 tablet (81 mg total) by mouth daily. Take after 12 weeks for prevention of preeclampssia later in pregnancy 07/21/17   Rubie Maid, MD  cyclobenzaprine (FLEXERIL) 10 MG tablet Take 10 mg by mouth 3 (three) times daily as needed for muscle spasms.    [provider]  folic acid (FOLVITE) 1 MG tablet Take 1 tablet (1 mg total) by mouth daily. 08/04/17   Cammie Sickle, MD  meclizine (ANTIVERT) 25 MG tablet Take 1 tablet (25 mg total) by mouth 3 (three) times daily as needed. Patient not taking: Reported on 07/27/2017 07/03/17   Rudene Re, MD  NIFEdipine (PROCARDIA-XL/ADALAT-CC/NIFEDICAL-XL) 30 MG 24 hr tablet Take 2 tablets (60 mg total) by mouth daily. Patient taking differently: Take 30 mg by mouth 2 (two) times daily.  07/03/17   Rudene Re, MD  NOVOLOG FLEXPEN 100 UNIT/ML FlexPen 0-9 units injected 3 times daily with meals according to the following scale: CBG 70-120 = 0 units CBG 121-150 = 1  unit CBG 151-200 = 2 units CBG 201-250 = 3 units CBG 251-300 = 5 units CBG 301-350 = 7 units CBG 351-400 = 9 units CBG >400 = call MD for advice 04/16/17   Cherene Altes, MD  ondansetron (ZOFRAN ODT) 4 MG disintegrating tablet Take 1 tablet (4 mg total) by mouth every 8 (eight) hours as needed for nausea. Patient not taking: Reported on 08/12/2017 06/29/17   Rubie Maid, MD  predniSONE (DELTASONE) 20 MG tablet  Take 3 pills once a day x 2 weeks; and will taper based on response to hemoglobin. 08/04/17   Cammie Sickle, MD  Prenatal Vit-Fe Fumarate-FA (MULTIVITAMIN-PRENATAL) 27-0.8 MG TABS tablet Take 1 tablet by mouth daily at 12 noon.    [provider]    Allergies  Allergen Reactions  . Morphine     Other reaction(s): Hallucination  . Sulfamethoxazole-Trimethoprim Hives  . Trulicity [Dulaglutide]     Family History  Problem Relation Age of Onset  . Hypertension Mother   . Cancer Mother        Uterine vs cervical (pt unsure),   . Schizophrenia Brother     Social History Social History   Tobacco Use  . Smoking status: Never Smoker  . Smokeless tobacco: Never Used  Substance Use Topics  . Alcohol use: No  . Drug use: No    Review of Systems Constitutional: Negative for fever. Eyes: Negative for visual complaints ENT: Negative for recent illness/congestion Cardiovascular: Negative for chest pain.  Does feel palpitations at times which she describes as her heart beating hard in her chest. Respiratory: Negative for shortness of breath. Gastrointestinal: Negative for abdominal pain, vomiting  Genitourinary: Negative for urinary compaints Musculoskeletal: Lower extremity edema Skin: Negative for skin complaints  Neurological: Negative for headache All other ROS negative  ____________________________________________   PHYSICAL EXAM:  VITAL SIGNS: ED Triage Vitals  Enc Vitals Group     BP 08/13/17 1708 (!) 189/109     Pulse Rate 08/13/17 1708 (!) 127     Resp 08/13/17 1708 18     Temp 08/13/17 1708 99 F (37.2 C)     Temp Source 08/13/17 1708 Oral     SpO2 08/13/17 1708 100 %     Weight 08/13/17 1716 202 lb (91.6 kg)     Height 08/13/17 1716 _0  (1.702 m)     Head Circumference --      Peak Flow --      Pain Score 08/13/17 1716 0     Pain Loc --      Pain Edu? --      Excl. in [place]? --     Constitutional: Alert and oriented. Well appearing and in  no distress. Eyes: Normal exam ENT   Head: Normocephalic and atraumatic.   Mouth/Throat: Mucous membranes are moist. Cardiovascular: Normal rate, regular rhythm. Respiratory: Normal respiratory effort without tachypnea nor retractions. Breath sounds are clear Gastrointestinal: Soft and nontender. No distention.   Musculoskeletal: Nontender with normal range of motion in all extremities.  1+ lower extremity edema, equal bilaterally. Neurologic:  Normal speech and language. No gross focal neurologic deficits  Skin:  Skin is warm, dry and intact.  Psychiatric: Mood and affect are normal.   ____________________________________________    EKG  EKG reviewed and interpreted by myself shows sinus tachycardia 108 bpm, narrow QRS, normal axis, normal intervals, no concerning ST changes.  ____________________________________________   INITIAL IMPRESSION / ASSESSMENT AND PLAN / ED COURSE  Pertinent labs &  imaging results that were available during my care of the patient were reviewed by me and considered in my medical decision making (see chart for details).  Patient presents to the emergency department for elevated blood pressure lower extremity edema and anemia.  We will check labs including LFTs, CBC, troponin.  Will obtain an EKG.  We will discussed with OB/GYN once results are known.  Differential at this time would include hypertension of pregnancy, help syndrome, cardiomyopathy of pregnancy, peripheral edema.  Labs have resulted showing a significant leukocytosis likely related to her current prednisone use.  Hemoglobin is 7.0.  Discussed patient with Dr. Marcelline Mates will be/GYN, she states that her goal is a hemoglobin of 7-8 and does not recommend transfusion at this time.  Patient's creatinine is 2.1 which is largely baseline for the patient.  Her troponin is elevated 0.22.  Given the elevated troponin and continued elevated blood pressure I believe the patient warrants admission to the  hospital for further workup and possible echo cardiogram.  I discussed the patient with medicine who will be consulting, I will discussed the patient with Dr. Marcelline Mates for admission.  Dr. Marcelline Mates recommends starting methyldopa 250 mg twice daily, have ordered this for the patient.  CRITICAL CARE Performed by: Harvest Dark   Total critical care time: 30 minutes  Critical care time was exclusive of separately billable procedures and treating other patients.  Critical care was necessary to treat or prevent imminent or life-threatening deterioration.  Critical care was time spent personally by me on the following activities: development of treatment plan with patient and/or surrogate as well as nursing, discussions with consultants, evaluation of patient's response to treatment, examination of patient, obtaining history from patient or surrogate, ordering and performing treatments and interventions, ordering and review of laboratory studies, ordering and review of radiographic studies, pulse oximetry and re-evaluation of patient's condition.   ____________________________________________   FINAL CLINICAL IMPRESSION(S) / ED DIAGNOSES  Elevated troponin Peripheral edema Uncontrolled hypertension    Harvest Dark, MD 08/13/17 Junious Dresser    Harvest Dark, MD 08/22/17 2044

## 2017-08-13 NOTE — H&P (Addendum)
Reason for Consult: Uncontrolled HTN in pregnancy, elevated troponin Referring Physician: Dr. Harvest Dark (ER Physician)   HPI:  Kelly Huff is an 32 y.o. [phone] female currently at 50w0dgestation, with Estimated Date of Delivery: 02/04/18 who presented to the Emergency Room with complaints of elevated blood pressure for the past several days, lower extremity edema and anemia. She has a PMH that includes Type 1 DM, chronic renal disease, and anemia (of undetermined cause, but suspected to be hemolytic), s/p 3 blood transfusions this pregnancy so far.  Of note, patient states she is taking her blood pressure medications as prescribed.  Denies any SOB or chest pain.  She was also seen yesterday on Labor and Delivery for the same complaint, was given a dose of PO Labetalol, and was instructed to complete her triage in the Emergency Room, however patient left prior to being seen.  She does have a history of pre-eclampsia in prior pregnancy.   Pertinent Gynecological History: Menses: None. Patient is currently pregnant Blood transfusions: history of 3 blood transfusions during current pregnancy Patient's last menstrual period was 04/30/2017.     OB History  Gravida Para Term Preterm AB Living  '7 2 2   4 2  ' SAB TAB Ectopic Multiple Live Births    0     2    # Outcome Date GA Lbr Len/2nd Weight Sex Delivery Anes PTL Lv  7 Current           6 Term 06/2014 337w0d M Vag-Spont   LIV     Complications: Mild pre-eclampsia  5 AB           4 AB           3 AB           2 AB           1 Term     M Vag-Spont   LIV    Past Medical History:  Diagnosis Date  . Chronic kidney disease   . Diabetes mellitus without complication ([place]  . History of pre-eclampsia 2016   mild  . Hypertension     Past Surgical History:  Procedure Laterality Date  . DILATION AND CURETTAGE OF UTERUS     x 4    Family History  Problem Relation Age of Onset  . Hypertension Mother   . Cancer Mother         Uterine vs cervical (pt unsure),   . Schizophrenia Brother     Social History:  reports that she has never smoked. She has never used smokeless tobacco. She reports that she does not drink alcohol or use drugs.  Allergies:  Allergies  Allergen Reactions  . Morphine     Other reaction(s): Hallucination  . Sulfamethoxazole-Trimethoprim Hives  . Trulicity [Dulaglutide]     Medications: Prior to Admission:  No current facility-administered medications on file prior to encounter.    Current Outpatient Medications on File Prior to Encounter  Medication Sig Dispense Refill  . aspirin EC 81 MG tablet Take 1 tablet (81 mg total) by mouth daily. Take after 12 weeks for prevention of preeclampssia later in pregnancy 30272ablet 2  . folic acid (FOLVITE) 1 MG tablet Take 1 tablet (1 mg total) by mouth daily. 90 tablet 1  . NIFEdipine (PROCARDIA-XL/ADALAT-CC/NIFEDICAL-XL) 30 MG 24 hr tablet Take 2 tablets (60 mg total) by mouth daily. (Patient taking differently: Take 30 mg by mouth 2 (two) times daily. ) 30 tablet  0  . NOVOLOG FLEXPEN 100 UNIT/ML FlexPen 0-9 units injected 3 times daily with meals according to the following scale: CBG 70-120 = 0 units CBG 121-150 = 1 unit CBG 151-200 = 2 units CBG 201-250 = 3 units CBG 251-300 = 5 units CBG 301-350 = 7 units CBG 351-400 = 9 units CBG >400 = call MD for advice 15 mL 0  . predniSONE (DELTASONE) 20 MG tablet Take 3 pills once a day x 2 weeks; and will taper based on response to hemoglobin. 80 tablet 0  . Prenatal Vit-Fe Fumarate-FA (MULTIVITAMIN-PRENATAL) 27-0.8 MG TABS tablet Take 1 tablet by mouth daily at 12 noon.    . meclizine (ANTIVERT) 25 MG tablet Take 1 tablet (25 mg total) by mouth 3 (three) times daily as needed. (Patient not taking: Reported on 07/27/2017) 30 tablet 1  . ondansetron (ZOFRAN ODT) 4 MG disintegrating tablet Take 1 tablet (4 mg total) by mouth every 8 (eight) hours as needed for nausea. (Patient not taking: Reported  on 08/12/2017) 30 tablet 2    Review of Systems  Constitutional: Positive for malaise/fatigue. Negative for chills, fever and weight loss.  HENT: Negative.   Eyes: Negative.   Respiratory: Negative for cough, shortness of breath and wheezing.   Cardiovascular: Positive for palpitations and leg swelling. Negative for chest pain, orthopnea, claudication and PND.  Gastrointestinal: Negative.   Genitourinary: Negative.   Musculoskeletal: Negative.   Skin: Negative.  Negative for itching and rash.  Neurological: Negative.   Endo/Heme/Allergies: Negative.   Psychiatric/Behavioral: Negative.     Blood pressure (!) 163/90, pulse (!) 112, temperature 99 F (37.2 C), temperature source Oral, resp. rate (!) 7, height '5\' 7"'  (1.702 m), weight 202 lb (91.6 kg), last menstrual period 04/30/2017, SpO2 100 %. Physical Exam  Constitutional: She is oriented to person, place, and time. She appears well-developed and well-nourished. No distress.  HENT:  Head: Normocephalic and atraumatic.  Nose: Nose normal.  Mouth/Throat: Oropharynx is clear and moist.  Eyes: Pupils are equal, round, and reactive to light. EOM are normal. Right eye exhibits no discharge. Left eye exhibits no discharge. No scleral icterus.  Neck: Normal range of motion. Neck supple. No thyromegaly present.  Cardiovascular: Normal rate and normal heart sounds. Exam reveals no gallop and no friction rub.  No murmur heard. Tachycardia present  Respiratory: Effort normal and breath sounds normal. No respiratory distress. She has no wheezes. She exhibits no tenderness.  GI: Soft. Bowel sounds are normal. She exhibits no distension. There is no tenderness.  Gravid  Genitourinary:  Genitourinary Comments: Deferred  Musculoskeletal: Normal range of motion. She exhibits no edema, tenderness or deformity.  Lower extremity swelling, without edema  Neurological: She is alert and oriented to person, place, and time.  Skin: Skin is warm and dry.   Psychiatric: She has a normal mood and affect. Her behavior is normal. Thought content normal.    Results for orders placed or performed during the hospital encounter of 08/13/17 (from the past 48 hour(s))  CBC     Status: Abnormal   Collection Time: 08/13/17  5:26 PM  Result Value Ref Range   WBC 23.5 (H) 3.6 - 11.0 K/uL   RBC 2.38 (L) 3.80 - 5.20 MIL/uL   Hemoglobin 7.0 (L) 12.0 - 16.0 g/dL   HCT 20.7 (L) 35.0 - 47.0 %   MCV 87.3 80.0 - 100.0 fL   MCH 29.6 26.0 - 34.0 pg   MCHC 33.9 32.0 - 36.0 g/dL  RDW 16.6 (H) 11.5 - 14.5 %   Platelets 186 150 - 440 K/uL    Comment: Performed at [facility], [address] [place], [place] [address]  Comprehensive metabolic panel     Status: Abnormal   Collection Time: 08/13/17  5:26 PM  Result Value Ref Range   Sodium 134 (L) 135 - 145 mmol/L   Potassium 4.0 3.5 - 5.1 mmol/L   Chloride 110 101 - 111 mmol/L   CO2 17 (L) 22 - 32 mmol/L   Glucose, Bld 133 (H) 65 - 99 mg/dL   BUN 53 (H) 6 - 20 mg/dL   Creatinine, Ser 2.10 (H) 0.44 - 1.00 mg/dL   Calcium 8.2 (L) 8.9 - 10.3 mg/dL   Total Protein 6.1 (L) 6.5 - 8.1 g/dL   Albumin 2.6 (L) 3.5 - 5.0 g/dL   AST 26 15 - 41 U/L   ALT 19 14 - 54 U/L   Alkaline Phosphatase 78 38 - 126 U/L   Total Bilirubin 0.4 0.3 - 1.2 mg/dL   GFR calc non Af Amer 30 (L) >60 mL/min   GFR calc Af Amer 35 (L) >60 mL/min    Comment: (NOTE) The eGFR has been calculated using the CKD EPI equation. This calculation has not been validated in all clinical situations. eGFR's persistently <60 mL/min signify possible Chronic Kidney Disease.    Anion gap 7 5 - 15    Comment: Performed at [facility], [address] [place], [place] [address]  Urinalysis, Complete w Microscopic     Status: Abnormal   Collection Time: 08/13/17  5:26 PM  Result Value Ref Range   Color, Urine YELLOW (A) YELLOW   APPearance HAZY (A) CLEAR   Specific Gravity, Urine 1.014 1.005 - 1.030   pH 5.0 5.0 - 8.0   Glucose,  UA 150 (A) NEGATIVE mg/dL   Hgb urine dipstick MODERATE (A) NEGATIVE   Bilirubin Urine NEGATIVE NEGATIVE   Ketones, ur NEGATIVE NEGATIVE mg/dL   Protein, ur >=300 (A) NEGATIVE mg/dL   Nitrite NEGATIVE NEGATIVE   Leukocytes, UA NEGATIVE NEGATIVE   RBC / HPF 6-30 0 - 5 RBC/hpf   WBC, UA 0-5 0 - 5 WBC/hpf   Bacteria, UA RARE (A) NONE SEEN   Squamous Epithelial / LPF 0-5 (A) NONE SEEN   Mucus PRESENT    Granular Casts, UA PRESENT     Comment: Performed at [facility], [address] [place], [place] [address]  Troponin I     Status: Abnormal   Collection Time: 08/13/17  5:26 PM  Result Value Ref Range   Troponin I 0.22 (HH) <0.03 ng/mL    Comment: CRITICAL RESULT CALLED TO, READ BACK BY AND VERIFIED WITH KAILEY WALKER AT 1810 ON 08/13/2017 JJB Performed at [facility], [address] [place], [place] [address]      EKG: .normal EKG, normal sinus rhythm, there are no previous tracings available for comparison    Assessment/Plan: 1. Uncontrolled HTN in pregnancy - patient currently on Procardia 30 mg BID for HTN, was previously controlled until several days ago when she began noting elevations in BP and tachycardia.  This also coincides with her recently initiating prednisone course for her anemia.  Elevated BPs may be secondary to steroid course.  Patient is too early in gestation for pre-eclampsia/HEELP. However today also with elevated Troponin.  Patient currently without complaints of chest pain or SOB.  EKG with no significant findings concerning for cardiac event.  Will admit and monitor with serial enzymes. Hospitalist also consulted.  She is currently taking Procardia at max dosing, will add Methyldopa 250 mg BID to regimen and titrate up as needed.  Patient has tried Labetalol in early pregnancy and discontinued to side effects, however will use IV dosing prn for severe range BPs that persist despite use of oral medications.  2. Type 1 DM - patient currently  being managed with insulin pump (sees Endocrinologist for management of DM during current pregnancy). Blood sugars have been elevated over the past week due to prednisone dosing. Will adjust insulin coverage accordingly. Continue to monitor blood sugars 4 x daily. Hospitalist can also assist with management while inpatient.  Diabetic diet ordered.  3. Anemia - currently undergoing workup for suspected hemolytic anemia.  Patient has received 3 blood transfusions during the pregnancy to date. Current Hgb is at 7 (which has been the norm for her during the pregnancy).  Is asymptomatic except with moderate exertion at work (i.e. Copy stairs). Has recently started on prednisone course to help with anemia.  Will transfuse as needed, however patient currently at baseline.  4. CKD - patient followed by Nephrology, with no current recommendations during the pregnancy. Will continue to monitor patient's renal function during the pregnancy, but has remained relatively stable since ~ [redacted] weeks gestation. Most recent Cr today is 2.10. We continue to expect changes in Creatinine with advancing gestational age and will manage accordingly.  5. Pregnancy at 16 weeks - patient is currently pre-viable. Will manage pregnancy accordingly. Will check FHT daily. Has appointment with [facility] on Monday due to complicated medical history in pregnancy.  6. DVT prophylaxis - patient is ambulatory, but will place Ted hose.    Rubie Maid, MD [facility] 08/13/2017     Addendum 10:27 PM  Hospitalist has evaluated the patient, given her a dose of Labetalol PO which she tolerated.  Will discontinue Methyldopa and remain on labetalol 200 mg BID. Will also help with patient's palpitations.   Rubie Maid, MD [facility]

## 2017-08-13 NOTE — Consult Note (Signed)
[facility] NAME: Kelly Huff    MR#:  [id number]  DATE OF BIRTH:  10/30/1985  DATE OF ADMISSION:  08/13/2017  PRIMARY CARE PHYSICIAN: Lin Landsman, MD   REQUESTING/REFERRING PHYSICIAN: Dr. Harvest Dark  CHIEF COMPLAINT:   Chief Complaint  Patient presents with  . Hypertension    HISTORY OF PRESENT ILLNESS:  Kelly Huff  is a 32 y.o. female with a known history of insulin-dependent diabetes mellitus, hypertension, CKD stage III, anemia during this pregnancy requiring blood transfusions was brought in secondary to worsening weakness and also palpitations. Patient is well educated, going to nursing school.  She has had 2 prior pregnancies without any anemia or complications.  She is 15 weeks according to her recent ultrasound.  She has had transfusions for hemoglobin as low as 5.8 during this pregnancy.  Has been following up with hematology.  In spite of recent transfusion her blood counts did not improve, her haptoglobin was low and LDH was elevated so there was a concern for hemolytic anemia and she was just started on prednisone about a week ago.  Patient feels like she is being having palpitations since starting prednisone.  Her heart rate has been elevated, blood pressures are elevated and also sugars are elevated.  In the ER she is tachycardic with heart rate between 120-130 range, blood pressure has been significantly elevated.  Hemoglobin is at 7.0.  She was asked to decrease her prednisone to 40 mg instead of 60 mg, just  2 days ago due to complaining of palpitations.  Her troponin is noted to be elevated at 0.22.  Medical consult requested for the same.  Being admitted to OB/GYN service  PAST MEDICAL HISTORY:   Past Medical History:  Diagnosis Date  . Chronic kidney disease   . Diabetes mellitus without complication ([place])   . History of pre-eclampsia 2016   mild  . Hypertension     PAST SURGICAL  HISTOIRY:   Past Surgical History:  Procedure Laterality Date  . DILATION AND CURETTAGE OF UTERUS     x 4    SOCIAL HISTORY:   Social History   Tobacco Use  . Smoking status: Never Smoker  . Smokeless tobacco: Never Used  Substance Use Topics  . Alcohol use: No    FAMILY HISTORY:   Family History  Problem Relation Age of Onset  . Hypertension Mother   . Cancer Mother        Uterine vs cervical (pt unsure),   . Schizophrenia Brother     DRUG ALLERGIES:   Allergies  Allergen Reactions  . Morphine     Other reaction(s): Hallucination  . Sulfamethoxazole-Trimethoprim Hives  . Trulicity [Dulaglutide]     REVIEW OF SYSTEMS:   Review of Systems  Constitutional: Positive for malaise/fatigue. Negative for chills, fever and weight loss.  HENT: Negative for ear discharge, ear pain, hearing loss and nosebleeds.   Eyes: Negative for blurred vision, double vision and photophobia.  Respiratory: Negative for cough, hemoptysis, shortness of breath and wheezing.   Cardiovascular: Positive for palpitations, orthopnea and leg swelling. Negative for chest pain.  Gastrointestinal: Negative for abdominal pain, constipation, diarrhea, melena, nausea and vomiting.  Genitourinary: Negative for dysuria, frequency, hematuria and urgency.  Musculoskeletal: Negative for back pain, myalgias and neck pain.  Skin: Negative for rash.  Neurological: Negative for dizziness, tingling, sensory change, speech change, focal weakness and headaches.  Endo/Heme/Allergies: Does not bruise/bleed easily.  Psychiatric/Behavioral: Negative  for depression.   MEDICATIONS AT HOME:   Prior to Admission medications   Medication Sig Start Date End Date Taking? Authorizing Provider  aspirin EC 81 MG tablet Take 1 tablet (81 mg total) by mouth daily. Take after 12 weeks for prevention of preeclampssia later in pregnancy 07/21/17  Yes Rubie Maid, MD  folic acid (FOLVITE) 1 MG tablet Take 1 tablet (1 mg total)  by mouth daily. 08/04/17  Yes Cammie Sickle, MD  NIFEdipine (PROCARDIA-XL/ADALAT-CC/NIFEDICAL-XL) 30 MG 24 hr tablet Take 2 tablets (60 mg total) by mouth daily. Patient taking differently: Take 30 mg by mouth 2 (two) times daily.  07/03/17  Yes Alfred Levins, [place], MD  NOVOLOG FLEXPEN 100 UNIT/ML FlexPen 0-9 units injected 3 times daily with meals according to the following scale: CBG 70-120 = 0 units CBG 121-150 = 1 unit CBG 151-200 = 2 units CBG 201-250 = 3 units CBG 251-300 = 5 units CBG 301-350 = 7 units CBG 351-400 = 9 units CBG >400 = call MD for advice 04/16/17  Yes Cherene Altes, MD  predniSONE (DELTASONE) 20 MG tablet Take 3 pills once a day x 2 weeks; and will taper based on response to hemoglobin. 08/04/17  Yes Cammie Sickle, MD  Prenatal Vit-Fe Fumarate-FA (MULTIVITAMIN-PRENATAL) 27-0.8 MG TABS tablet Take 1 tablet by mouth daily at 12 noon.   Yes [provider]  meclizine (ANTIVERT) 25 MG tablet Take 1 tablet (25 mg total) by mouth 3 (three) times daily as needed. Patient not taking: Reported on 07/27/2017 07/03/17   Rudene Re, MD  ondansetron (ZOFRAN ODT) 4 MG disintegrating tablet Take 1 tablet (4 mg total) by mouth every 8 (eight) hours as needed for nausea. Patient not taking: Reported on 08/12/2017 06/29/17   Rubie Maid, MD      VITAL SIGNS:  Blood pressure (!) 144/77, pulse (!) 113, temperature 99 F (37.2 C), temperature source Oral, resp. rate (!) 22, height 5\' 7"  (1.702 m), weight 91.6 kg (202 lb), last menstrual period 04/30/2017, SpO2 100 %.  PHYSICAL EXAMINATION:   Physical Exam  GENERAL:  32 y.o.-year-old obese patient lying in the bed with no acute distress.  EYES: Pupils equal, round, reactive to light and accommodation. No scleral icterus. Extraocular muscles intact.  HEENT: Head atraumatic, normocephalic. Oropharynx and nasopharynx clear.  NECK:  Supple, no jugular venous distention. No thyroid enlargement, no tenderness.   LUNGS: Normal breath sounds bilaterally, no wheezing, rales,rhonchi or crepitation. No use of accessory muscles of respiration.  CARDIOVASCULAR: S1, S2 normal. Rapid heart rate. No  rubs, or gallops. 2/6 systolic murmur present ABDOMEN: Soft, nontender, nondistended. Bowel sounds present. No organomegaly or mass.  EXTREMITIES: No pedal edema, cyanosis, or clubbing.  NEUROLOGIC: Cranial nerves II through XII are intact. Muscle strength 5/5 in all extremities. Sensation intact. Gait not checked.  PSYCHIATRIC: The patient is alert and oriented x 3.  SKIN: No obvious rash, lesion, or ulcer.   LABORATORY PANEL:   CBC Recent Labs  Lab 08/13/17 1726  WBC 23.5*  HGB 7.0*  HCT 20.7*  PLT 186   ------------------------------------------------------------------------------------------------------------------  Chemistries  Recent Labs  Lab 08/13/17 1726  NA 134*  K 4.0  CL 110  CO2 17*  GLUCOSE 133*  BUN 53*  CREATININE 2.10*  CALCIUM 8.2*  AST 26  ALT 19  ALKPHOS 78  BILITOT 0.4   ------------------------------------------------------------------------------------------------------------------  Cardiac Enzymes Recent Labs  Lab 08/13/17 1726  TROPONINI 0.22*   ------------------------------------------------------------------------------------------------------------------  RADIOLOGY:  No results found.  EKG:   Orders placed or performed during the hospital encounter of 08/13/17  . ED EKG  . ED EKG    IMPRESSION AND PLAN:   Kelly Huff  is a 32 y.o. female with a known history of insulin-dependent diabetes mellitus, hypertension, CKD stage III, anemia during this pregnancy requiring blood transfusions was brought in secondary to worsening weakness and also palpitations  1. Palpitations and elevated troponin-elevated troponin could be demand ischemia from her anemia, palpitations and also for clearance from CKD. -EKG without any acute ST-T changes. -Patient  denies any active chest pain. -admit, tele monitor, troponin trend- due to anemia- hold off aspirin and heparin for now - ECHO, cards consult - labetolol added -Heart rate persistently elevated-we will advised transfer to medical service and transfer to telemetry  2. Hypertension- too early for pre-eclampsia. Likely from recent prednisone use -On alpha methyldopa and nifedipine. -Labetalol added  3.  Anemia-since this pregnancy over the last 3 months requiring transfusions. -Recent thoughts about possible hemolytic anemia and started on prednisone. -Continue prednisone, hematology consulted -Due to demand ischemia and palpitations-we will transfuse 1 unit for now.  Would advise maintaining hb around 8  4. Leukocytosis-likely from recent steroids.  Urine analysis with no infection  5.  Diabetes mellitus-sliding scale insulin and continue home medication  6.  CKD-stable with baseline creatinine around 2  7.  DVT prophylaxis-teds and SCDs   All the records are reviewed and case discussed with Consulting provider. Management plans discussed with the patient, family and they are in agreement.  CODE STATUS:  Full Code  TOTAL TIME TAKING CARE OF THIS PATIENT: 55 minutes.    Gladstone Lighter M.D on 08/13/2017 at 8:46 PM  Between 7am to 6pm - Pager - [phone]  After 6pm go to [email or web address] - password [email or web address] [facility]  Office  [phone]  CC: Primary care Physician: Lin Landsman, MD

## 2017-08-13 NOTE — ED Triage Notes (Addendum)
Pt states that she has been on prednisone for the past week for low hgb, pt states that she was seen yesterday for swelling, pt cont to have worsening swelling, palpitations and htn. Pt is [redacted] weeks pregnant and reports that she has had 3 units of blood

## 2017-08-13 NOTE — Telephone Encounter (Signed)
Patient returned my call and still concerned about her bp and heart rate, I advised she call her OB or PCP

## 2017-08-14 ENCOUNTER — Observation Stay (HOSPITAL_BASED_OUTPATIENT_CLINIC_OR_DEPARTMENT_OTHER)
Admit: 2017-08-14 | Discharge: 2017-08-14 | Disposition: A | Discharge status: Home / Self Care | Payer: Medicaid Other | Attending: Internal Medicine | Admitting: Internal Medicine

## 2017-08-14 DIAGNOSIS — R000 Tachycardia, unspecified: Secondary | ICD-10-CM

## 2017-08-14 DIAGNOSIS — O99012 Anemia complicating pregnancy, second trimester: Secondary | ICD-10-CM | POA: Diagnosis not present

## 2017-08-14 DIAGNOSIS — N183 Chronic kidney disease, stage 3 unspecified: Secondary | ICD-10-CM | POA: Diagnosis present

## 2017-08-14 DIAGNOSIS — R778 Other specified abnormalities of plasma proteins: Secondary | ICD-10-CM | POA: Diagnosis present

## 2017-08-14 DIAGNOSIS — I10 Essential (primary) hypertension: Secondary | ICD-10-CM

## 2017-08-14 DIAGNOSIS — Z3A15 15 weeks gestation of pregnancy: Secondary | ICD-10-CM

## 2017-08-14 DIAGNOSIS — R079 Chest pain, unspecified: Secondary | ICD-10-CM | POA: Diagnosis not present

## 2017-08-14 DIAGNOSIS — D649 Anemia, unspecified: Secondary | ICD-10-CM | POA: Diagnosis not present

## 2017-08-14 DIAGNOSIS — N189 Chronic kidney disease, unspecified: Secondary | ICD-10-CM | POA: Diagnosis not present

## 2017-08-14 DIAGNOSIS — R748 Abnormal levels of other serum enzymes: Secondary | ICD-10-CM | POA: Diagnosis not present

## 2017-08-14 DIAGNOSIS — R7989 Other specified abnormal findings of blood chemistry: Secondary | ICD-10-CM | POA: Diagnosis present

## 2017-08-14 DIAGNOSIS — E109 Type 1 diabetes mellitus without complications: Secondary | ICD-10-CM | POA: Diagnosis not present

## 2017-08-14 LAB — CBC WITH DIFFERENTIAL/PLATELET
Band Neutrophils: 4 %
Basophils Absolute: 0 10*3/uL (ref 0–0.1)
Basophils Relative: 0 %
Blasts: 0 %
Eosinophils Absolute: 0 10*3/uL (ref 0–0.7)
Eosinophils Relative: 0 %
HCT: 22.2 % — ABNORMAL LOW (ref 35.0–47.0)
Hemoglobin: 7.7 g/dL — ABNORMAL LOW (ref 12.0–16.0)
Lymphocytes Relative: 8 %
Lymphs Abs: 1.3 10*3/uL (ref 1.0–3.6)
MCH: 30.2 pg (ref 26.0–34.0)
MCHC: 34.7 g/dL (ref 32.0–36.0)
MCV: 87.2 fL (ref 80.0–100.0)
Metamyelocytes Relative: 1 %
Monocytes Absolute: 0.2 10*3/uL (ref 0.2–0.9)
Monocytes Relative: 1 %
Myelocytes: 1 %
Neutro Abs: 14.9 10*3/uL — ABNORMAL HIGH (ref 1.4–6.5)
Neutrophils Relative %: 85 %
Other: 0 %
Platelets: 146 10*3/uL — ABNORMAL LOW (ref 150–440)
Promyelocytes Relative: 0 %
RBC: 2.55 MIL/uL — ABNORMAL LOW (ref 3.80–5.20)
RDW: 16 % — ABNORMAL HIGH (ref 11.5–14.5)
WBC: 16.4 10*3/uL — ABNORMAL HIGH (ref 3.6–11.0)
nRBC: 0 /100 WBC

## 2017-08-14 LAB — GLUCOSE, CAPILLARY
Glucose-Capillary: 173 mg/dL — ABNORMAL HIGH (ref 65–99)
Glucose-Capillary: 101 mg/dL — ABNORMAL HIGH (ref 65–99)

## 2017-08-14 LAB — ECHOCARDIOGRAM COMPLETE
Height: 67 in
Weight: 3232 oz

## 2017-08-14 LAB — LACTATE DEHYDROGENASE: LDH: 434 U/L — ABNORMAL HIGH (ref 98–192)

## 2017-08-14 LAB — RETICULOCYTES
RBC.: 2.74 MIL/uL — ABNORMAL LOW (ref 3.80–5.20)
Retic Count, Absolute: 219.2 10*3/uL — ABNORMAL HIGH (ref 19.0–183.0)
Retic Ct Pct: 8 % — ABNORMAL HIGH (ref 0.4–3.1)

## 2017-08-14 LAB — TROPONIN I: Troponin I: 0.12 ng/mL (ref <0.03)

## 2017-08-14 MED ORDER — SODIUM CHLORIDE 0.9% FLUSH
3.0000 mL | INTRAVENOUS | Status: AC | PRN
  Administered 2017-08-14: 3 mL

## 2017-08-14 MED ORDER — SODIUM CHLORIDE 0.9% FLUSH
3.0000 mL | INTRAVENOUS | Status: AC | PRN
  Administered 2017-08-15: 3 mL

## 2017-08-14 NOTE — Consult Note (Signed)
ELECTROPHYSIOLOGY Cardiology NOTE  Patient ID: Kelly Huff, MRN: [id number], DOB/AGE: 1985/11/19 32 y.o. Admit date: 08/13/2017 Date of Consult: 08/14/2017  Primary Physician: Lin Landsman, MD Primary Cardiologist: Kelly Huff is a 32 y.o. female who is being seen today for the evaluation of elevated troponin and tachycardia at the request of Dr Posey Pronto.      HPI Kelly Huff is a 32 y.o. female  [place] P2-0-4-2 presented with complaints of edema, palpitations worsening hypertension and anemia.  The latter has been thought possibly to be related to hemolysis for which she was 4/10  started on prednisone.  Her symptoms have all occurred in the context of her prednisone.  They were mitigated initially by down titration from 60--40 but then returned "with a vengeance ".  She has history of type 1 diabetes.  On arrival her blood pressure was 189/109 with heart rates in the 120 range.  Since initiation of the prednisone blood pressures have been elevated significantly.  Her heart rates are also significantly higher.  V  She was noted to have positive troponins which are gradually decreasing.   I discussed with Dr. Marcelline Mates who does not feel that pulmonary embolism is a concern   Past Medical History:  Diagnosis Date  . Chronic kidney disease   . Diabetes mellitus without complication ([place])   . History of pre-eclampsia 2016   mild  . Hypertension       Surgical History:  Past Surgical History:  Procedure Laterality Date  . DILATION AND CURETTAGE OF UTERUS     x 4     Home Meds: Prior to Admission medications   Medication Sig Start Date End Date Taking? Authorizing Provider  aspirin EC 81 MG tablet Take 1 tablet (81 mg total) by mouth daily. Take after 12 weeks for prevention of preeclampssia later in pregnancy 07/21/17  Yes Rubie Maid, MD  folic acid (FOLVITE) 1 MG tablet Take 1 tablet (1 mg total) by mouth daily. 08/04/17  Yes Cammie Sickle, MD    NIFEdipine (PROCARDIA-XL/ADALAT-CC/NIFEDICAL-XL) 30 MG 24 hr tablet Take 2 tablets (60 mg total) by mouth daily. Patient taking differently: Take 30 mg by mouth 2 (two) times daily.  07/03/17  Yes Alfred Levins, [place], MD  NOVOLOG FLEXPEN 100 UNIT/ML FlexPen 0-9 units injected 3 times daily with meals according to the following scale: CBG 70-120 = 0 units CBG 121-150 = 1 unit CBG 151-200 = 2 units CBG 201-250 = 3 units CBG 251-300 = 5 units CBG 301-350 = 7 units CBG 351-400 = 9 units CBG >400 = call MD for advice 04/16/17  Yes Cherene Altes, MD  predniSONE (DELTASONE) 20 MG tablet Take 3 pills once a day x 2 weeks; and will taper based on response to hemoglobin. 08/04/17  Yes Cammie Sickle, MD  Prenatal Vit-Fe Fumarate-FA (MULTIVITAMIN-PRENATAL) 27-0.8 MG TABS tablet Take 1 tablet by mouth daily at 12 noon.   Yes [provider]  meclizine (ANTIVERT) 25 MG tablet Take 1 tablet (25 mg total) by mouth 3 (three) times daily as needed. Patient not taking: Reported on 07/27/2017 07/03/17   Rudene Re, MD  ondansetron (ZOFRAN ODT) 4 MG disintegrating tablet Take 1 tablet (4 mg total) by mouth every 8 (eight) hours as needed for nausea. Patient not taking: Reported on 08/12/2017 06/29/17   Rubie Maid, MD    Inpatient Medications:  . aspirin EC  81 mg Oral Daily  . docusate sodium  100 mg Oral Daily  .  folic acid  1 mg Oral Daily  . insulin aspart  0-5 Units Subcutaneous QHS  . insulin aspart  0-9 Units Subcutaneous TID WC  . insulin pump   Subcutaneous 1 day or 1 dose  . labetalol  200 mg Oral BID  . NIFEdipine  30 mg Oral BID  . predniSONE  20 mg Oral BID WC  . prenatal multivitamin  1 tablet Oral Q1200      Allergies:  Allergies  Allergen Reactions  . Morphine     Other reaction(s): Hallucination  . Sulfamethoxazole-Trimethoprim Hives  . Trulicity [Dulaglutide]     Social History   Socioeconomic History  . Marital status: Single    Spouse name: Not on  file  . Number of children: Not on file  . Years of education: Not on file  . Highest education level: Not on file  Occupational History  . Not on file  Social Needs  . Financial resource strain: Not on file  . Food insecurity:    Worry: Not on file    Inability: Not on file  . Transportation needs:    Medical: Not on file    Non-medical: Not on file  Tobacco Use  . Smoking status: Never Smoker  . Smokeless tobacco: Never Used  Substance and Sexual Activity  . Alcohol use: No  . Drug use: No  . Sexual activity: Yes    Birth control/protection: None  Lifestyle  . Physical activity:    Days per week: Not on file    Minutes per session: Not on file  . Stress: Not on file  Relationships  . Social connections:    Talks on phone: Not on file    Gets together: Not on file    Attends religious service: Not on file    Active member of club or organization: Not on file    Attends meetings of clubs or organizations: Not on file    Relationship status: Not on file  . Intimate partner violence:    Fear of current or ex partner: Not on file    Emotionally abused: Not on file    Physically abused: Not on file    Forced sexual activity: Not on file  Other Topics Concern  . Not on file  Social History Narrative  . Not on file     Family History  Problem Relation Age of Onset  . Hypertension Mother   . Cancer Mother        Uterine vs cervical (pt unsure),   . Schizophrenia Brother      ROS:  Please see the history of present illness.     All other systems reviewed and negative.    Physical Exam:  Blood pressure (!) 142/85, pulse (!) 103, temperature 98.4 F (36.9 C), temperature source Oral, resp. rate 20, height 5\' 7"  (1.702 m), weight 202 lb (91.6 kg), last menstrual period 04/30/2017, SpO2 100 %. General: Well developed, well nourished female in no acute distress. Head: Normocephalic, atraumatic, sclera non-icteric, no xanthomas, nares are without discharge. EENT:  normal Lymph Nodes:  none Back: without scoliosis/kyphosis   no CVA tendersness Neck: Negative for carotid bruits. JVD not elevated. broad right carotid pulsation  Lungs: Clear bilaterally to auscultation without wheezes, rales, or rhonchi. Breathing is unlabored. Heart: Rapid but RRR with S1 S2.  2/6 systolic murmur , rubs, or gallops appreciated. Abdomen: Soft, non-tender, non-distended with normoactive bowel sounds. No hepatomegaly. No rebound/guarding. No obvious abdominal masses. Msk:  Strength and  tone appear normal for age. Extremities: No clubbing or cyanosis.  2+  edema.  Distal pedal pulses are 2+ and equal bilaterally. Skin: Warm and Dry Neuro: Alert and oriented X 3. CN III-XII intact Grossly normal sensory and motor function . Psych:  Responds to questions appropriately with a normal affect.      Labs: Cardiac Enzymes Recent Labs    08/13/17 1726 08/13/17 2319 08/14/17 0447  TROPONINI 0.22* 0.15* 0.12*   CBC Lab Results  Component Value Date   WBC 16.4 (H) 08/14/2017   HGB 7.7 (L) 08/14/2017   HCT 22.2 (L) 08/14/2017   MCV 87.2 08/14/2017   PLT 146 (L) 08/14/2017   PROTIME: No results for input(s): LABPROT, INR in the last 72 hours. Chemistry  Recent Labs  Lab 08/13/17 1726  NA 134*  K 4.0  CL 110  CO2 17*  BUN 53*  CREATININE 2.10*  CALCIUM 8.2*  PROT 6.1*  BILITOT 0.4  ALKPHOS 78  ALT 19  AST 26  GLUCOSE 133*   Lipids Lab Results  Component Value Date   CHOL 175 04/14/2017   HDL 32 (L) 04/14/2017   LDLCALC 100 (H) 04/14/2017   TRIG 214 (H) 04/14/2017   BNP No results found for: PROBNP Thyroid Function Tests: No results for input(s): TSH, T4TOTAL, T3FREE, THYROIDAB in the last 72 hours.  Invalid input(s): FREET3    Miscellaneous No results found for: DDIMER  Radiology/Studies:  No results found.  EKG: Personally reviewed sinus rhythm with appropriate looking P wave   Assessment and Plan:  Troponin  elevation  Tachycardia-sinus   Pregnancy-15 weeks   hypertension  Renal insufficiency acute  Anemia-severe question hemolytic  Steroid therapy for the above  Question carotid artery aneurysm   The patient has an elevated troponin which is trending down.  It is unlikely that she has cardiac disease and is much more likely to be related to demand in the context of her hypertension, tachycardia, and severe anemia.  She had dyspnea.  This was not impressive to Dr. Marcelline Mates.  I discussed with her the possibility of pulmonary embolism; she thinks it is unlikely.  She notes that D-dimers are typically elevated so were not so useful here.  Her renal function and pregnancy makes imaging problematic.  Temporally, the blood pressure and heart rate seem related to the introduction of the prednisone.  Labetalol has helped both.  She also has significant edema which will be watched I guess as the concerns of worsening renal function.  Echo is pending.  She has a broad carotid pulse.  It may be related, I guess, to superficial path; however, it is quite unusual.  Have ordered a carotid vascular study.  We will review it but otherwise we will see her as needed.    Virl Axe

## 2017-08-14 NOTE — Progress Notes (Signed)
Antenatal Progress Note  Subjective:     Patient ID: Kelly Huff is a 32 y.o. female at [redacted]w[redacted]d gestation, Estimated Date of Delivery: 02/04/18 by last menstrual period of 04/30/2017 consistent with 1st trimester sono who was admitted for uncontrolled cHTN in pregnancy, palpitations and leg swelling.  Also with PMH of CKD, Type 1 DM.  HD# 2.   Subjective:  Patient denies major complaints today.  Continues to deny chest pain. Received blood transfusion of 1 unit PRBCs overnight.   Review of Systems Denies pelvic pain, vaginal bleeding.     Objective:   Vitals:   08/13/17 2340 08/14/17 0130 08/14/17 0445 08/14/17 0738  BP: 134/87 134/77 (!) 149/83 (!) 146/75  Pulse: (!) 101 (!) 108 (!) 108 97  Resp: 20 18 18 17   Temp: 98.5 F (36.9 C) 98.3 F (36.8 C) 98.1 F (36.7 C) 98.1 F (36.7 C)  TempSrc: Oral Oral Oral Oral  SpO2: 100% 100% 100% 100%  Weight:      Height:        General appearance: alert and no distress Lungs: clear to auscultation bilaterally Heart: regular rhythm, S1, S2 normal, no murmur, click, rub or gallop. Borderline tachycardia Abdomen: soft, non-tender; bowel sounds normal; no masses,  no organomegaly. Gravid.  Pelvic: deferred Extremities: extremities normal, atraumatic, no cyanosis or pitting edema. Mild swelling of bilateral lower extremities.   FHT appreciated: 147 bpm.   Labs:  Results for orders placed or performed during the hospital encounter of 08/13/17  CBC  Result Value Ref Range   WBC 23.5 (H) 3.6 - 11.0 K/uL   RBC 2.38 (L) 3.80 - 5.20 MIL/uL   Hemoglobin 7.0 (L) 12.0 - 16.0 g/dL   HCT 20.7 (L) 35.0 - 47.0 %   MCV 87.3 80.0 - 100.0 fL   MCH 29.6 26.0 - 34.0 pg   MCHC 33.9 32.0 - 36.0 g/dL   RDW 16.6 (H) 11.5 - 14.5 %   Platelets 186 150 - 440 K/uL  Comprehensive metabolic panel  Result Value Ref Range   Sodium 134 (L) 135 - 145 mmol/L   Potassium 4.0 3.5 - 5.1 mmol/L   Chloride 110 101 - 111 mmol/L   CO2 17 (L) 22 - 32 mmol/L   Glucose, Bld 133 (H) 65 - 99 mg/dL   BUN 53 (H) 6 - 20 mg/dL   Creatinine, Ser 2.10 (H) 0.44 - 1.00 mg/dL   Calcium 8.2 (L) 8.9 - 10.3 mg/dL   Total Protein 6.1 (L) 6.5 - 8.1 g/dL   Albumin 2.6 (L) 3.5 - 5.0 g/dL   AST 26 15 - 41 U/L   ALT 19 14 - 54 U/L   Alkaline Phosphatase 78 38 - 126 U/L   Total Bilirubin 0.4 0.3 - 1.2 mg/dL   GFR calc non Af Amer 30 (L) >60 mL/min   GFR calc Af Amer 35 (L) >60 mL/min   Anion gap 7 5 - 15  Urinalysis, Complete w Microscopic  Result Value Ref Range   Color, Urine YELLOW (A) YELLOW   APPearance HAZY (A) CLEAR   Specific Gravity, Urine 1.014 1.005 - 1.030   pH 5.0 5.0 - 8.0   Glucose, UA 150 (A) NEGATIVE mg/dL   Hgb urine dipstick MODERATE (A) NEGATIVE   Bilirubin Urine NEGATIVE NEGATIVE   Ketones, ur NEGATIVE NEGATIVE mg/dL   Protein, ur >=300 (A) NEGATIVE mg/dL   Nitrite NEGATIVE NEGATIVE   Leukocytes, UA NEGATIVE NEGATIVE   RBC / HPF 6-30 0 - 5  RBC/hpf   WBC, UA 0-5 0 - 5 WBC/hpf   Bacteria, UA RARE (A) NONE SEEN   Squamous Epithelial / LPF 0-5 (A) NONE SEEN   Mucus PRESENT    Granular Casts, UA PRESENT   Troponin I  Result Value Ref Range   Troponin I 0.22 (HH) <0.03 ng/mL  Troponin I  Result Value Ref Range   Troponin I 0.15 (HH) <0.03 ng/mL  Troponin I  Result Value Ref Range   Troponin I 0.12 (HH) <0.03 ng/mL  Glucose, capillary  Result Value Ref Range   Glucose-Capillary 144 (H) 65 - 99 mg/dL  CBC with Differential/Platelet  Result Value Ref Range   WBC 16.4 (H) 3.6 - 11.0 K/uL   RBC 2.55 (L) 3.80 - 5.20 MIL/uL   Hemoglobin 7.7 (L) 12.0 - 16.0 g/dL   HCT 22.2 (L) 35.0 - 47.0 %   MCV 87.2 80.0 - 100.0 fL   MCH 30.2 26.0 - 34.0 pg   MCHC 34.7 32.0 - 36.0 g/dL   RDW 16.0 (H) 11.5 - 14.5 %   Platelets 146 (L) 150 - 440 K/uL   Neutrophils Relative % 85 %   Lymphocytes Relative 8 %   Monocytes Relative 1 %   Eosinophils Relative 0 %   Basophils Relative 0 %   Band Neutrophils 4 %   Metamyelocytes Relative 1 %    Myelocytes 1 %   Promyelocytes Relative 0 %   Blasts 0 %   nRBC 0 0 /100 WBC   Other 0 %   Neutro Abs 14.9 (H) 1.4 - 6.5 K/uL   Lymphs Abs 1.3 1.0 - 3.6 K/uL   Monocytes Absolute 0.2 0.2 - 0.9 K/uL   Eosinophils Absolute 0.0 0 - 0.7 K/uL   Basophils Absolute 0.0 0 - 0.1 K/uL  ECHOCARDIOGRAM COMPLETE  Result Value Ref Range   Weight 3,232 oz   Height 67 in   BP 146/75 mmHg  Type and screen [place]  Result Value Ref Range   ABO/RH(D) B POS    Antibody Screen NEG    Sample Expiration 08/16/2017    Unit Number [id number]    Blood Component Type RED CELLS,LR    Unit division 00    Status of Unit ISSUED,FINAL    Transfusion Status OK TO TRANSFUSE    Crossmatch Result      Compatible Performed at [facility], [address], [place], [place] [address]   Prepare RBC  Result Value Ref Range   Order Confirmation      ORDER PROCESSED BY BLOOD BANK Performed at [facility], [address], [place], [place] [address]   BPAM [facility]  Result Value Ref Range   ISSUE DATE / TIME [id number]    Blood Product Unit Number [id number]    PRODUCT CODE [id number]    Unit Type and Rh 7300    Blood Product Expiration Date [id number]     Assessment/Plan:  32 y.o. female [redacted]w[redacted]d, Estimated Date of Delivery: 02/04/18 with:     1. Uncontrolled HTN in pregnancy - patient currently on Procardia 30 mg BID for HTN, was initiated on Labetalol 200 mg BID last night. Also received one dose of Methyldopa in the ER last night prior to admission but was discontinued as patient was able to tolerate the Labetalol without side effects (had issues with labetalol in early pregnancy and had to discontinue use).  BPs better controlled today, in acceptable range for cHTN in pregnancy. Will continue  current regimen. Likely will need to continue current regimen as long as she is on the prednisone as this has led to some elevations in her BP since initiation.   2.  Elevated troponins - likely due to cardiac strain from elevated BPs and chronic anemia. Troponins are trending down (currently 0.12, down from 0.22 at admission). Cardiology consulted.  Is s/p echocardiogram this morning, pending results.  Has had no cardiac events thus far noted on telemetry. Remains asymptomatic.   3. Type 1 DM - patient currently being managed with insulin pump (sees Endocrinologist for management of DM during current pregnancy). Blood sugars have been elevated over the past week due to prednisone dosing. Will adjust insulin coverage accordingly. Continue to monitor blood sugars 4 x daily. Hospitalist assisting with management while inpatient.  Currently on diabetic diet.  4. Anemia - currently undergoing workup for suspected hemolytic anemia with Hematology as outpatient.  Patient has received 3 blood transfusions during the pregnancy to date, now s/p 4th transfusion of 1 unit PRBCs.  Current Hgb is at 7.7 (up from 7.0 on admission)  Is asymptomatic except with moderate exertion at work (i.e. Copy stairs). Continue course of prednisone.   5. CKD - patient followed by Nephrology, with no current active recommendations during the pregnancy thus far. Will continue to monitor patient's renal function during the pregnancy, but has remained relatively stable since ~ [redacted] weeks gestation. Most recent Cr this admission is 2.10. We continue to expect changes in Creatinine with advancing gestational age and will manage accordingly.  No issues with urine output.   6. Pregnancy at 15 weeks - patient is currently pre-viable. Will manage pregnancy accordingly. Will check FHT daily. Has appointment with [facility] on Monday due to complicated medical history in pregnancy.   7. DVT prophylaxis - patient is very ambulatory, also has Ted hose.     Rubie Maid, MD [facility]

## 2017-08-14 NOTE — Consult Note (Signed)
Hematology/Oncology Consult note [facility] [phone] [phone]  Patient Care Team: Kelly Landsman, MD as PCP - General (Family Medicine)   Name of the patient: Kelly Huff  [id number]  [birth year]    Reason for referral- anemia   Referring physician- Dr. Marcelline Huff  Date of visit: 08/14/2017    History of presenting illness- Patient is a 32 yr old african [place] female currently [redacted] weeks pregnant. She sees Dr. Rogue Huff as an outpatient. He has been following her for her anemia since march 2019. Her hb since then has been around 7 but at times has dropped to 5-6. Extensive anemia work up including iron studies, b12, folate PNH normal. She was found to have elevated LDH and low haptoglobin. Therefore hemolytic anemia was suspected. She chose not to start steroids until she enters second trimester and started taking her steroids about a week ago.   She also has a h/o CKD for which she sees DR. Candiss Huff as an outpatient. She has type 1DM and did not have any issues with her prior 2 pregnancies. She was admitted to hospital in dec 2018 when her creatinine was found to be 2. She has had work up done by Dr. Candiss Huff. She has not had kidney biopsy. Cause of her CKD is unclear possible due to DM. However patient states that she follows up with endocrinology as well and her blood sugars are well controlled and her hb A1c was close to 6. However UA checked yesterday still shows significant proteinuria and microscopic hematuria. She has hypoalbuminemia, HTN and edema  ECOG PS- 0  Pain scale- 0   Review of systems- Review of Systems  Constitutional: Negative for chills, fever, malaise/fatigue and weight loss.  HENT: Negative for congestion, ear discharge and nosebleeds.   Eyes: Negative for blurred vision.  Respiratory: Negative for cough, hemoptysis, sputum production, shortness of breath and wheezing.   Cardiovascular: Negative for chest pain,  palpitations, orthopnea and claudication.  Gastrointestinal: Negative for abdominal pain, blood in stool, constipation, diarrhea, heartburn, melena, nausea and vomiting.  Genitourinary: Negative for dysuria, flank pain, frequency, hematuria and urgency.  Musculoskeletal: Negative for back pain, joint pain and myalgias.  Skin: Negative for rash.  Neurological: Negative for dizziness, tingling, focal weakness, seizures, weakness and headaches.  Endo/Heme/Allergies: Does not bruise/bleed easily.  Psychiatric/Behavioral: Negative for depression and suicidal ideas. The patient does not have insomnia.     Allergies  Allergen Reactions  . Morphine     Other reaction(s): Hallucination  . Sulfamethoxazole-Trimethoprim Hives  . Trulicity [Dulaglutide]     Patient Active Problem List   Diagnosis Date Noted  . Elevated troponin 08/14/2017  . CKD (chronic kidney disease) stage 3, GFR 30-59 ml/min (HCC) 08/14/2017  . Chronic hypertension affecting pregnancy 08/13/2017  . Labor and delivery, indication for care 08/12/2017  . 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 Iron deficiency anemia during pregnancy 07/06/2017  . History of pre-eclampsia in prior pregnancy, currently pregnant in first trimester 06/12/2017  . Diabetes ([place]) 04/13/2017  . Acute renal failure ([place])   . Dehydration   . Other nonspecific abnormal finding 01/29/2016  . Family history of BRCA gene positive 07/13/2013  . 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 Chronic kidney disease   . Diabetes mellitus without complication ([place])   . History of pre-eclampsia 2016   mild  . Hypertension  Past Surgical History:  Procedure Laterality Date  . DILATION AND CURETTAGE OF UTERUS     x 4    Social History   Socioeconomic History  . Marital status: Single    Spouse name: Not on file  . Number of children: Not on file    . Years of education: Not on file  . Highest education level: Not on file  Occupational History  . Not on file  Social Needs  . Financial resource strain: Not on file  . Food insecurity:    Worry: Not on file    Inability: Not on file  . Transportation needs:    Medical: Not on file    Non-medical: Not on file  Tobacco Use  . Smoking status: Never Smoker  . Smokeless tobacco: Never Used  Substance and Sexual Activity  . Alcohol use: No  . Drug use: No  . Sexual activity: Yes    Birth control/protection: None  Lifestyle  . Physical activity:    Days per week: Not on file    Minutes per session: Not on file  . Stress: Not on file  Relationships  . Social connections:    Talks on phone: Not on file    Gets together: Not on file    Attends religious service: Not on file    Active member of club or organization: Not on file    Attends meetings of clubs or organizations: Not on file    Relationship status: Not on file  . Intimate partner violence:    Fear of current or ex partner: Not on file    Emotionally abused: Not on file    Physically abused: Not on file    Forced sexual activity: Not on file  Other Topics Concern  . Not on file  Social History Narrative  . Not on file     Family History  Problem Relation Age of Onset  . Hypertension Mother   . Cancer Mother        Uterine vs cervical (pt unsure),   . Schizophrenia Brother      Current Facility-Administered Medications:  .  acetaminophen (TYLENOL) tablet 650 mg, 650 mg, Oral, Q6H PRN, Rubie Maid, MD .  aspirin EC tablet 81 mg, 81 mg, Oral, Daily, Rubie Maid, MD, 81 mg at 08/14/17 1005 .  calcium carbonate (TUMS - dosed in mg elemental calcium) chewable tablet 400 mg of elemental calcium, 2 tablet, Oral, Q4H PRN, Rubie Maid, MD .  docusate sodium (COLACE) capsule 100 mg, 100 mg, Oral, Daily, Rubie Maid, MD, 100 mg at 08/14/17 1005 .  folic acid (FOLVITE) tablet 1 mg, 1 mg, Oral, Daily, Rubie Maid, MD, 1 mg at 08/14/17 1005 .  insulin aspart (novoLOG) injection 0-5 Units, 0-5 Units, Subcutaneous, QHS, Kalisetti, Radhika, MD .  insulin aspart (novoLOG) injection 0-9 Units, 0-9 Units, Subcutaneous, TID WC, Kalisetti, Radhika, MD .  insulin pump, , Subcutaneous, 1 day or 1 dose, Rubie Maid, MD .  labetalol (NORMODYNE) tablet 200 mg, 200 mg, Oral, BID, Gladstone Lighter, MD, 200 mg at 08/14/17 1005 .  labetalol (NORMODYNE,TRANDATE) injection 10 mg, 10 mg, Intravenous, Q2H PRN, Rubie Maid, MD .  NIFEdipine (PROCARDIA-XL/ADALAT CC) 24 hr tablet 30 mg, 30 mg, Oral, BID, Rubie Maid, MD, 30 mg at 08/14/17 0705 .  ondansetron (ZOFRAN-ODT) disintegrating tablet 4 mg, 4 mg, Oral, Q8H PRN, Rubie Maid, MD .  predniSONE (DELTASONE) tablet 20 mg, 20 mg, Oral, BID WC, Rubie Maid, MD, 20 mg  at 08/14/17 0736 .  prenatal multivitamin tablet 1 tablet, 1 tablet, Oral, Q1200, Rubie Maid, MD, 1 tablet at 08/14/17 1328 .  sodium chloride flush (NS) 0.9 % injection 3 mL, 3 mL, Intracatheter, PRN, Charlaine Dalton R, MD .  sodium chloride flush (NS) 0.9 % injection 3 mL, 3 mL, Intracatheter, PRN, Charlaine Dalton R, MD .  zolpidem (AMBIEN) tablet 5 mg, 5 mg, Oral, QHS PRN, Rubie Maid, MD   Physical exam:  Vitals:   08/13/17 2340 08/14/17 0130 08/14/17 0445 08/14/17 0738  BP: 134/87 134/77 (!) 149/83 (!) 146/75  Pulse: (!) 101 (!) 108 (!) 108 97  Resp: '20 18 18 17  ' Temp: 98.5 F (36.9 C) 98.3 F (36.8 C) 98.1 F (36.7 C) 98.1 F (36.7 C)  TempSrc: Oral Oral Oral Oral  SpO2: 100% 100% 100% 100%  Weight:      Height:       Physical Exam  Constitutional: She is oriented to person, place, and time.  HENT:  Head: Normocephalic and atraumatic.  Eyes: Pupils are equal, round, and reactive to light. EOM are normal.  Neck: Normal range of motion.  Cardiovascular: Regular rhythm and normal heart sounds.  tachycardic  Pulmonary/Chest: Effort normal and breath sounds normal.    Abdominal: Soft. Bowel sounds are normal.  gravid  Musculoskeletal: She exhibits edema (b/l pitting +1).  Neurological: She is alert and oriented to person, place, and time.  Skin: Skin is warm and dry.       CMP Latest Ref Rng & Units 08/13/2017  Glucose 65 - 99 mg/dL 133(H)  BUN 6 - 20 mg/dL 53(H)  Creatinine 0.44 - 1.00 mg/dL 2.10(H)  Sodium 135 - 145 mmol/L 134(L)  Potassium 3.5 - 5.1 mmol/L 4.0  Chloride 101 - 111 mmol/L 110  CO2 22 - 32 mmol/L 17(L)  Calcium 8.9 - 10.3 mg/dL 8.2(L)  Total Protein 6.5 - 8.1 g/dL 6.1(L)  Total Bilirubin 0.3 - 1.2 mg/dL 0.4  Alkaline Phos 38 - 126 U/L 78  AST 15 - 41 U/L 26  ALT 14 - 54 U/L 19   CBC Latest Ref Rng & Units 08/14/2017  WBC 3.6 - 11.0 K/uL 16.4(H)  Hemoglobin 12.0 - 16.0 g/dL 7.7(L)  Hematocrit 35.0 - 47.0 % 22.2(L)  Platelets 150 - 440 K/uL 146(L)    '@IMAGES' @  No results found.  Assessment and plan- Patient is a 32 y.o. female with normocytic anemia type 1 DM and CKD  I have reviewed patients prior anemia work up. I have also spoken to Dr. Rogue Huff over the phone today. On review of prior and current labs- there is some component of hemolysis given elevated LDH and low haptoglobin in the past. hapto from today pending. However she has a negative coombs, normal bilirubin and her retic count although high, her RPI today is 2 which is normal response for her degree of anemia.   There may be some component of hemolysis contributing to her anemia. She has been started on steroids a week ago and I would give it 1 more week to see if she responds to it. Continue current dose of steroids and supportive transfusions if hb <7.  I do feel that her anemia is also due to her chronic kidney disease. She has persistent proteinuria, hypoalbuminemia, microscopic hematuria, edema and HTN which needs to be evaluated by nephrology especially since her blood sugars are relatively well controlled per patient.   Procrit has been used in anemia  of chronic kidney disease  but does carry risks in pregnancy which I discussed with the patient. She will discuss this further with Dr. Rogue Huff. EPO levels from today are pending  Patient is seeing [facility] on Monday  Patient had elevated troponin of 0.22 which is coming down to 0.12 today. Cardiology has been consulted   Thank you for this kind referral and the opportunity to participate in the care of this patient   Visit Diagnosis 1. Hypertension, unspecified type   2. Elevated troponin   3. Peripheral edema   4. Iron deficiency anemia during pregnancy     Dr. Randa Evens, MD, MPH [facility] [phone] 08/14/2017 2:45 PM

## 2017-08-14 NOTE — Progress Notes (Signed)
[place] at [facility]                                                                                                                                                                                  Patient Demographics   Kelly Huff, is a 32 y.o. female, DOB - [birth year], HDQ:[id number]  Admit date - 08/13/2017   Admitting Physician Rubie Maid, MD  Outpatient Primary MD for the patient is Lin Landsman, MD   LOS - 0  Subjective: Pt feeling better, palpations improved    Review of Systems:   CONSTITUTIONAL: No documented fever. No fatigue, weakness. No weight gain, no weight loss.  EYES: No blurry or double vision.  ENT: No tinnitus. No postnasal drip. No redness of the oropharynx.  RESPIRATORY: No cough, no wheeze, no hemoptysis. No dyspnea.  CARDIOVASCULAR: No chest pain. No orthopnea. No palpitations. No syncope.  GASTROINTESTINAL: No nausea, no vomiting or diarrhea. No abdominal pain. No melena or hematochezia.  GENITOURINARY: No dysuria or hematuria.  ENDOCRINE: No polyuria or nocturia. No heat or cold intolerance.  HEMATOLOGY: No anemia. No bruising. No bleeding.  INTEGUMENTARY: No rashes. No lesions.  MUSCULOSKELETAL: No arthritis. No swelling. No gout.  NEUROLOGIC: No numbness, tingling, or ataxia. No seizure-type activity.  PSYCHIATRIC: No anxiety. No insomnia. No ADD.    Vitals:   Vitals:   08/13/17 2340 08/14/17 0130 08/14/17 0445 08/14/17 0738  BP: 134/87 134/77 (!) 149/83 (!) 146/75  Pulse: (!) 101 (!) 108 (!) 108 97  Resp: 20 18 18 17   Temp: 98.5 F (36.9 C) 98.3 F (36.8 C) 98.1 F (36.7 C) 98.1 F (36.7 C)  TempSrc: Oral Oral Oral Oral  SpO2: 100% 100% 100% 100%  Weight:      Height:        Wt Readings from Last 3 Encounters:  08/13/17 91.6 kg (202 lb)  08/05/17 88.5 kg (195 lb 3.2 oz)  08/04/17 87.6 kg (193 lb 3.2 oz)     Intake/Output Summary (Last 24 hours) at 08/14/2017 1432 Last data filed at 08/14/2017  1200 Gross per 24 hour  Intake 950 ml  Output 1600 ml  Net -650 ml    Physical Exam:   GENERAL: Pleasant-appearing in no apparent distress.  HEAD, EYES, EARS, NOSE AND THROAT: Atraumatic, normocephalic. Extraocular muscles are intact. Pupils equal and reactive to light. Sclerae anicteric. No conjunctival injection. No oro-pharyngeal erythema.  NECK: Supple. There is no jugular venous distention. No bruits, no lymphadenopathy, no thyromegaly.  HEART: Regular rate and rhythm,. No murmurs, no rubs, no clicks.  LUNGS: Clear to auscultation bilaterally. No rales or rhonchi. No wheezes.  ABDOMEN: Soft, flat, nontender,  nondistended. Has good bowel sounds. No hepatosplenomegaly appreciated.  EXTREMITIES: No evidence of any cyanosis, clubbing, or peripheral edema.  +2 pedal and radial pulses bilaterally.  NEUROLOGIC: The patient is alert, awake, and oriented x3 with no focal motor or sensory deficits appreciated bilaterally.  SKIN: Moist and warm with no rashes appreciated.  Psych: Not anxious, depressed LN: No inguinal LN enlargement    Antibiotics   Anti-infectives (From admission, onward)   None      Medications   Scheduled Meds: . aspirin EC  81 mg Oral Daily  . docusate sodium  100 mg Oral Daily  . folic acid  1 mg Oral Daily  . insulin aspart  0-5 Units Subcutaneous QHS  . insulin aspart  0-9 Units Subcutaneous TID WC  . insulin pump   Subcutaneous 1 day or 1 dose  . labetalol  200 mg Oral BID  . NIFEdipine  30 mg Oral BID  . predniSONE  20 mg Oral BID WC  . prenatal multivitamin  1 tablet Oral Q1200   Continuous Infusions: PRN Meds:.acetaminophen, calcium carbonate, labetalol, ondansetron, sodium chloride flush, sodium chloride flush, zolpidem   Data Review:   Micro Results No results found for this or any previous visit (from the past 240 hour(s)).  Radiology Reports No results found.   CBC Recent Labs  Lab 08/11/17 1146 08/13/17 1726 08/14/17 0447  WBC  23.8* 23.5* 16.4*  HGB 7.5* 7.0* 7.7*  HCT 21.0* 20.7* 22.2*  PLT 218 186 146*  MCV 85.2 87.3 87.2  MCH 30.2 29.6 30.2  MCHC 35.5 33.9 34.7  RDW 15.3* 16.6* 16.0*  LYMPHSABS 0.9*  --  1.3  MONOABS 1.0*  --  0.2  EOSABS 0.0  --  0.0  BASOSABS 0.0  --  0.0    Chemistries  Recent Labs  Lab 08/13/17 1726  NA 134*  K 4.0  CL 110  CO2 17*  GLUCOSE 133*  BUN 53*  CREATININE 2.10*  CALCIUM 8.2*  AST 26  ALT 19  ALKPHOS 78  BILITOT 0.4   ------------------------------------------------------------------------------------------------------------------ estimated creatinine clearance is 44.3 mL/min (A) (by C-G formula based on SCr of 2.1 mg/dL (H)). ------------------------------------------------------------------------------------------------------------------ No results for input(s): HGBA1C in the last 72 hours. ------------------------------------------------------------------------------------------------------------------ No results for input(s): CHOL, HDL, LDLCALC, TRIG, CHOLHDL, LDLDIRECT in the last 72 hours. ------------------------------------------------------------------------------------------------------------------ No results for input(s): TSH, T4TOTAL, T3FREE, THYROIDAB in the last 72 hours.  Invalid input(s): FREET3 ------------------------------------------------------------------------------------------------------------------ Recent Labs    08/14/17 1158  RETICCTPCT 8.0*    Coagulation profile No results for input(s): INR, PROTIME in the last 168 hours.  No results for input(s): DDIMER in the last 72 hours.  Cardiac Enzymes Recent Labs  Lab 08/13/17 1726 08/13/17 2319 08/14/17 0447  TROPONINI 0.22* 0.15* 0.12*   ------------------------------------------------------------------------------------------------------------------ Invalid input(s): POCBNP    Assessment & Plan   1. Uncontrolled HTN in pregnancy -  Continue labetalol and  procardia   2. Elevated troponins - likely due to cardiac strain from elevated BPs and chronic anemia. Troponins are trending down (currently 0.12, down from 0.22 at admission). Cardiology consulted.    Echocardiogram done results currently pending  3. Type 1 DM - patient currently being managed with insulin pump (sees Endocrinologist for management of DM during current pregnancy).  Blood sugars currently stable  4. Anemia - currently undergoing workup for suspected hemolytic anemia with Hematology as outpatient. Continue prednisone hematology evaluation pending   5. CKD -continue to monitor  6. Pregnancy at 15 weeks - patient is  currently pre-viable. Will manage pregnancy accordingly. Will check FHT daily.Has appointment with [facility] on Monday due to complicated medical history in pregnancy.  7. DVT prophylaxis - patient is very ambulatory, also has Ted hose.           Code Status Orders  (From admission, onward)        Start     Ordered   08/13/17 1955  Full code  Continuous     08/13/17 2007    Code Status History    Date Active Date Inactive Code Status Order ID Comments User Context   04/14/2017 0151 04/16/2017 1622 Full Code [id number]  Phillips Grout, MD ED             DVT Prophylaxis SCDs  Lab Results  Component Value Date   PLT 146 (L) 08/14/2017     Time Spent in minutes   73min Greater than 50% of time spent in care coordination and counseling patient regarding the condition and plan of care.   Dustin Flock M.D on 08/14/2017 at 2:32 PM  Between 7am to 6pm - Pager - [phone]  After 6pm go to [email or web address] - Proofreader  [employer]   Office  [phone]

## 2017-08-15 DIAGNOSIS — D649 Anemia, unspecified: Secondary | ICD-10-CM | POA: Diagnosis not present

## 2017-08-15 DIAGNOSIS — O99012 Anemia complicating pregnancy, second trimester: Secondary | ICD-10-CM | POA: Diagnosis not present

## 2017-08-15 DIAGNOSIS — E109 Type 1 diabetes mellitus without complications: Secondary | ICD-10-CM | POA: Diagnosis not present

## 2017-08-15 DIAGNOSIS — N189 Chronic kidney disease, unspecified: Secondary | ICD-10-CM | POA: Diagnosis not present

## 2017-08-15 LAB — CBC
HCT: 19.2 % — ABNORMAL LOW (ref 35.0–47.0)
Hemoglobin: 6.6 g/dL — ABNORMAL LOW (ref 12.0–16.0)
MCH: 30.2 pg (ref 26.0–34.0)
MCHC: 34.4 g/dL (ref 32.0–36.0)
MCV: 87.6 fL (ref 80.0–100.0)
Platelets: 134 10*3/uL — ABNORMAL LOW (ref 150–440)
RBC: 2.19 MIL/uL — ABNORMAL LOW (ref 3.80–5.20)
RDW: 16.1 % — ABNORMAL HIGH (ref 11.5–14.5)
WBC: 18.5 10*3/uL — ABNORMAL HIGH (ref 3.6–11.0)

## 2017-08-15 LAB — GLUCOSE, CAPILLARY
Glucose-Capillary: 161 mg/dL — ABNORMAL HIGH (ref 65–99)
Glucose-Capillary: 39 mg/dL — CL (ref 65–99)
Glucose-Capillary: 45 mg/dL — ABNORMAL LOW (ref 65–99)
Glucose-Capillary: 139 mg/dL — ABNORMAL HIGH (ref 65–99)
Glucose-Capillary: 164 mg/dL — ABNORMAL HIGH (ref 65–99)
Glucose-Capillary: 374 mg/dL — ABNORMAL HIGH (ref 65–99)
Glucose-Capillary: 331 mg/dL — ABNORMAL HIGH (ref 65–99)
Glucose-Capillary: 304 mg/dL — ABNORMAL HIGH (ref 65–99)

## 2017-08-15 LAB — BASIC METABOLIC PANEL
Anion gap: 3 — ABNORMAL LOW (ref 5–15)
BUN: 54 mg/dL — ABNORMAL HIGH (ref 6–20)
CO2: 19 mmol/L — ABNORMAL LOW (ref 22–32)
Calcium: 7.7 mg/dL — ABNORMAL LOW (ref 8.9–10.3)
Chloride: 112 mmol/L — ABNORMAL HIGH (ref 101–111)
Creatinine, Ser: 2.3 mg/dL — ABNORMAL HIGH (ref 0.44–1.00)
GFR calc Af Amer: 31 mL/min — ABNORMAL LOW (ref >60)
GFR calc non Af Amer: 27 mL/min — ABNORMAL LOW (ref >60)
Glucose, Bld: 112 mg/dL — ABNORMAL HIGH (ref 65–99)
Potassium: 4.1 mmol/L (ref 3.5–5.1)
Sodium: 134 mmol/L — ABNORMAL LOW (ref 135–145)

## 2017-08-15 LAB — HEMOGLOBIN AND HEMATOCRIT, BLOOD
HCT: 23.9 % — ABNORMAL LOW (ref 35.0–47.0)
Hemoglobin: 8.2 g/dL — ABNORMAL LOW (ref 12.0–16.0)

## 2017-08-15 LAB — ERYTHROPOIETIN: Erythropoietin: 7 m[IU]/mL (ref 2.6–18.5)

## 2017-08-15 LAB — PREPARE RBC (CROSSMATCH)

## 2017-08-15 MED ORDER — SODIUM CHLORIDE 0.9 % IV SOLN: Freq: Once | INTRAVENOUS | Status: DC

## 2017-08-15 MED ORDER — FAMOTIDINE 20 MG PO TABS
10.0000 mg | ORAL_TABLET | Freq: Two times a day (BID) | ORAL | Status: DC
  Administered 2017-08-15 – 2017-08-16 (×3): 10 mg via ORAL
  Filled 2017-08-15 (×3): qty 1

## 2017-08-15 MED ORDER — METOCLOPRAMIDE HCL 5 MG PO TABS
10.0000 mg | ORAL_TABLET | Freq: Three times a day (TID) | ORAL | Status: DC
  Administered 2017-08-15 – 2017-08-16 (×4): 10 mg via ORAL
  Filled 2017-08-15: qty 1
  Filled 2017-08-15: qty 2
  Filled 2017-08-15: qty 1
  Filled 2017-08-15: qty 2

## 2017-08-15 MED ORDER — LABETALOL HCL 300 MG PO TABS
300.0000 mg | ORAL_TABLET | Freq: Three times a day (TID) | ORAL | Status: DC
  Administered 2017-08-15 (×3): 300 mg via ORAL
  Filled 2017-08-15 (×6): qty 1

## 2017-08-15 NOTE — Progress Notes (Addendum)
Antenatal Progress Note  Subjective:     Patient ID: Kelly Huff is a 32 y.o. female at [redacted]w[redacted]d gestation, Estimated Date of Delivery: 02/04/18 by last menstrual period of 04/30/2017 consistent with 1st trimester sono who was admitted for uncontrolled cHTN in pregnancy, palpitations and leg swelling, and elevated troponin level.  Also with PMH of CKD, Type 1 DM.  HD# 2.   Subjective:  Patient notes feeling bloated, but overall doing ok. Is ambulating, voiding without difficulty, and tolerating diet. Does note an episode of hypoglycemia this morning.   Review of Systems Denies pelvic pain, vaginal bleeding.     Objective:   Vitals:   08/14/17 2300 08/14/17 2315 08/15/17 0317 08/15/17 0759  BP: (!) 166/84 132/63 (!) 154/77 (!) 152/88  Pulse: 97 97 95 97  Resp: 20 20 18 18   Temp: 98.6 F (37 C)  98.3 F (36.8 C) 98.2 F (36.8 C)  TempSrc: Oral  Oral Oral  SpO2: 100%  100% 100%  Weight:      Height:        General appearance: alert and no distress Lungs: clear to auscultation bilaterally Heart: regular rate and rhythm, S1, S2 normal, no murmur, click, rub or gallop.  Abdomen: soft, non-tender; bowel sounds normal; no masses,  no organomegaly. Gravid.  Pelvic: deferred Extremities: extremities normal, atraumatic, no cyanosis or pitting edema. Mild swelling of bilateral lower extremities. Ted hose on.   FHT appreciated: 152 bpm.   Labs:   CBC Latest Ref Rng & Units 08/15/2017 08/14/2017 08/13/2017  WBC 3.6 - 11.0 K/uL 18.5(H) 16.4(H) 23.5(H)  Hemoglobin 12.0 - 16.0 g/dL 6.6(L) 7.7(L) 7.0(L)  Hematocrit 35.0 - 47.0 % 19.2(L) 22.2(L) 20.7(L)  Platelets 150 - 440 K/uL 134(L) 146(L) 186   Results for ISABELLY, KOBLER (MRN [id number]) as of 08/15/2017 10:13  Ref. Range 08/13/2017 17:26 08/15/2017 05:04  COMPREHENSIVE METABOLIC PANEL Unknown Rpt (A)   Sodium Latest Ref Range: 135 - 145 mmol/L 134 (L) 134 (L)  Potassium Latest Ref Range: 3.5 - 5.1 mmol/L 4.0 4.1  Chloride Latest  Ref Range: 101 - 111 mmol/L 110 112 (H)  CO2 Latest Ref Range: 22 - 32 mmol/L 17 (L) 19 (L)  Glucose Latest Ref Range: 65 - 99 mg/dL 133 (H) 112 (H)  BUN Latest Ref Range: 6 - 20 mg/dL 53 (H) 54 (H)  Creatinine Latest Ref Range: 0.44 - 1.00 mg/dL 2.10 (H) 2.30 (H)  Calcium Latest Ref Range: 8.9 - 10.3 mg/dL 8.2 (L) 7.7 (L)  Anion gap Latest Ref Range: 5 - 15  7 3  (L)  Alkaline Phosphatase Latest Ref Range: 38 - 126 U/L 78   Albumin Latest Ref Range: 3.5 - 5.0 g/dL 2.6 (L)   AST Latest Ref Range: 15 - 41 U/L 26   ALT Latest Ref Range: 14 - 54 U/L 19   Total Protein Latest Ref Range: 6.5 - 8.1 g/dL 6.1 (L)   Total Bilirubin Latest Ref Range: 0.3 - 1.2 mg/dL 0.4   GFR, Est Non African American Latest Ref Range: >60 mL/min 30 (L) 27 (L)  GFR, Est African American Latest Ref Range: >60 mL/min 35 (L) 31 (L)     08/13/2017 EKG: normal EKG, normal sinus rhythm.  08/14/2017 Echocardiogram:  Study Conclusions  - Left ventricle: The cavity size was normal. Systolic function was   normal. The estimated ejection fraction was in the range of 60%   to 65%. Wall motion was normal; there were no regional wall   motion abnormalities.  Doppler parameters are consistent with   abnormal left ventricular relaxation (grade 1 diastolic   dysfunction). - Mitral valve: There was mild regurgitation. - Left atrium: The atrium was normal in size. - Right ventricle: Systolic function was normal. - Pulmonary arteries: Systolic pressure was within the normal   range.   Assessment/Plan:  32 y.o. female [redacted]w[redacted]d, Estimated Date of Delivery: 02/04/18 with:     1. Chronic HTN in pregnancy - patient currently on Procardia 30 mg BID for HTN and initiated on Labetalol 200 mg BID during this admission. BPs overall better controlled (was 180s/100s at admission, now down to 140s-150s/80s-90s).  Goal range is to keep BPs less than 160s/100s during pregnancy, preferably 150s/90s or less. Continue current management, and can  titrate Labetalol up as needed. Prednisone likely contributing to some elevations in BP as elevations coincided with initiation of steroids. BPs were previously well controlled.    2. Elevated troponins - likely due to cardiac strain from elevated BPs and chronic anemia. Troponins trended downward over 24 hrs. Cardiology on board, is s/p overall normal echocardiogram (EF 65%, with mild mitral regurgitation which is a common finding in pregnancy).   Has had no cardiac events thus far noted on telemetry. Remains asymptomatic.  Tachycardia overall well controlled, likely with Labetalol.  Cardiologist recommends a carotid vascular study due to findings of a broad carotid pulse.  Unsure if this will be done today as it has not been ordered as urgent or stat over this weekend. May likely be done tomorrow.   3. Type 1 DM - patient currently being managed with insulin pump (sees Endocrinologist for management of DM during current pregnancy). Had hypoglycemic episode this morning, however better now.  Continue to monitor blood sugars closely.  Currently on diabetic diet.  4. Anemia - currently undergoing workup for suspected hemolytic anemia with Hematology as outpatient.  Patient has received several blood transfusion during the pregnancy so far  (3 prior to current admission). Received one unit of PRBCs on day of admission with Hgb improving to 7.7 (up from 7.0 on admission).  Today however, Hgb is now back down to 6.6.  Unsure if this is related to hemolysis, or if patient was hemo-concentrated and the initial CBC was not true value.  Is asymptomatic except with moderate exertion. Will see if recommendations for an additional unit are warranted by Hematology/Cardiology. Hematology also considering recommendations for Procrit, however can have risks in pregnancy. Awaiting further lab evaluation, including epo levels.  5. CKD - patient followed by Nephrology, with no current active recommendations during the  pregnancy thus far.  Creatinine has been slightly up and down over the past 2 weeks but remains at relative level (was 2.41 on 4/11, 2.10 on admission of 4/19, and today is 2.30).  No issues with urine output, and electrolytes remain relatively stable (borderline low sodium and borderline elevated chloride).  Calcium has decreased from 8.2 to 7.7, may benefit from some oral supplementation.   6. Pregnancy at 15 weeks - patient is currently pre-viable. Will manage pregnancy accordingly. Continue to check FHT daily. Is scheduled for next appointment with [facility] on Monday due to complicated medical history in pregnancy, however if still inpatient, can be seen on the floor.   7. DVT prophylaxis - patient is very ambulatory, also has Ted hose.     Rubie Maid, MD [facility]

## 2017-08-15 NOTE — Progress Notes (Signed)
[place] at [facility]                                                                                                                                                                                  Patient Demographics   Kelly Huff, is a 32 y.o. female, DOB - 18-Jul-1985, XTG:[id number]  Admit date - 08/13/2017   Admitting Physician Rubie Maid, MD  Outpatient Primary MD for the patient is Lin Landsman, MD   LOS - 0  Subjective: She had had a hypoglycemia earlier this morning now sugars improved.  Hemoglobin did drop again    Review of Systems:   CONSTITUTIONAL: No documented fever.  Positive fatigue, weakness. No weight gain, no weight loss.  EYES: No blurry or double vision.  ENT: No tinnitus. No postnasal drip. No redness of the oropharynx.  RESPIRATORY: No cough, no wheeze, no hemoptysis. No dyspnea.  CARDIOVASCULAR: No chest pain. No orthopnea. No palpitations. No syncope.  GASTROINTESTINAL: No nausea, no vomiting or diarrhea. No abdominal pain. No melena or hematochezia.  GENITOURINARY: No dysuria or hematuria.  ENDOCRINE: No polyuria or nocturia. No heat or cold intolerance.  HEMATOLOGY: No anemia. No bruising. No bleeding.  INTEGUMENTARY: No rashes. No lesions.  MUSCULOSKELETAL: No arthritis. No swelling. No gout.  NEUROLOGIC: No numbness, tingling, or ataxia. No seizure-type activity.  PSYCHIATRIC: No anxiety. No insomnia. No ADD.    Vitals:   Vitals:   08/15/17 0317 08/15/17 0759 08/15/17 1130 08/15/17 1210  BP: (!) 154/77 (!) 152/88 (!) 165/86 (!) 161/82  Pulse: 95 97 91 92  Resp: 18 18 18 16   Temp: 98.3 F (36.8 C) 98.2 F (36.8 C) 97.7 F (36.5 C)   TempSrc: Oral Oral Oral   SpO2: 100% 100% 100% 100%  Weight:      Height:        Wt Readings from Last 3 Encounters:  08/13/17 91.6 kg (202 lb)  08/05/17 88.5 kg (195 lb 3.2 oz)  08/04/17 87.6 kg (193 lb 3.2 oz)     Intake/Output Summary (Last 24 hours) at 08/15/2017  1348 Last data filed at 08/15/2017 1130 Gross per 24 hour  Intake 20 ml  Output 2300 ml  Net -2280 ml    Physical Exam:   GENERAL: Pleasant-appearing in no apparent distress.  HEAD, EYES, EARS, NOSE AND THROAT: Atraumatic, normocephalic. Extraocular muscles are intact. Pupils equal and reactive to light. Sclerae anicteric. No conjunctival injection. No oro-pharyngeal erythema.  NECK: Supple. There is no jugular venous distention. No bruits, no lymphadenopathy, no thyromegaly.  HEART: Regular rate and rhythm,. No murmurs, no rubs, no clicks.  LUNGS: Clear to auscultation bilaterally. No rales or  rhonchi. No wheezes.  ABDOMEN: Soft, flat, nontender, nondistended. Has good bowel sounds. No hepatosplenomegaly appreciated.  EXTREMITIES: No evidence of any cyanosis, clubbing, or peripheral edema.  +2 pedal and radial pulses bilaterally.  NEUROLOGIC: The patient is alert, awake, and oriented x3 with no focal motor or sensory deficits appreciated bilaterally.  SKIN: Moist and warm with no rashes appreciated.  Psych: Not anxious, depressed LN: No inguinal LN enlargement    Antibiotics   Anti-infectives (From admission, onward)   None      Medications   Scheduled Meds: . aspirin EC  81 mg Oral Daily  . docusate sodium  100 mg Oral Daily  . famotidine  10 mg Oral BID  . folic acid  1 mg Oral Daily  . insulin aspart  0-5 Units Subcutaneous QHS  . insulin aspart  0-9 Units Subcutaneous TID WC  . labetalol  300 mg Oral TID  . metoCLOPramide  10 mg Oral TID AC  . NIFEdipine  30 mg Oral BID  . predniSONE  20 mg Oral BID WC  . prenatal multivitamin  1 tablet Oral Q1200   Continuous Infusions: . sodium chloride     PRN Meds:.acetaminophen, calcium carbonate, labetalol, ondansetron, sodium chloride flush, zolpidem   Data Review:   Micro Results No results found for this or any previous visit (from the past 240 hour(s)).  Radiology Reports No results found.   CBC Recent Labs   Lab 08/11/17 1146 08/13/17 1726 08/14/17 0447 08/15/17 0504  WBC 23.8* 23.5* 16.4* 18.5*  HGB 7.5* 7.0* 7.7* 6.6*  HCT 21.0* 20.7* 22.2* 19.2*  PLT 218 186 146* 134*  MCV 85.2 87.3 87.2 87.6  MCH 30.2 29.6 30.2 30.2  MCHC 35.5 33.9 34.7 34.4  RDW 15.3* 16.6* 16.0* 16.1*  LYMPHSABS 0.9*  --  1.3  --   MONOABS 1.0*  --  0.2  --   EOSABS 0.0  --  0.0  --   BASOSABS 0.0  --  0.0  --     Chemistries  Recent Labs  Lab 08/13/17 1726 08/15/17 0504  NA 134* 134*  K 4.0 4.1  CL 110 112*  CO2 17* 19*  GLUCOSE 133* 112*  BUN 53* 54*  CREATININE 2.10* 2.30*  CALCIUM 8.2* 7.7*  AST 26  --   ALT 19  --   ALKPHOS 78  --   BILITOT 0.4  --    ------------------------------------------------------------------------------------------------------------------ estimated creatinine clearance is 40.4 mL/min (A) (by C-G formula based on SCr of 2.3 mg/dL (H)). ------------------------------------------------------------------------------------------------------------------ No results for input(s): HGBA1C in the last 72 hours. ------------------------------------------------------------------------------------------------------------------ No results for input(s): CHOL, HDL, LDLCALC, TRIG, CHOLHDL, LDLDIRECT in the last 72 hours. ------------------------------------------------------------------------------------------------------------------ No results for input(s): TSH, T4TOTAL, T3FREE, THYROIDAB in the last 72 hours.  Invalid input(s): FREET3 ------------------------------------------------------------------------------------------------------------------ Recent Labs    08/14/17 1158  RETICCTPCT 8.0*    Coagulation profile No results for input(s): INR, PROTIME in the last 168 hours.  No results for input(s): DDIMER in the last 72 hours.  Cardiac Enzymes Recent Labs  Lab 08/13/17 1726 08/13/17 2319 08/14/17 0447  TROPONINI 0.22* 0.15* 0.12*    ------------------------------------------------------------------------------------------------------------------ Invalid input(s): POCBNP    Assessment & Plan   1. Uncontrolled HTN in pregnancy -  I will increase labetalol to 300 mg 3 times daily   2. Elevated troponins - likely due to cardiac strain from elevated BPs and chronic anemia. Troponins are trending down (currently 0.12, down from 0.22 at admission). Cardiology consulted.    Echocardiogram  shows normal EF  3. Type 1 DM - patient currently being managed with insulin pump (sees Endocrinologist for management of DM during current pregnancy).  Blood glucose labile I have encouraged patient to eat snack before going to bed tonight  4. Anemia - currently undergoing workup for suspected hemolytic anemia with Hematology  Hemoglobin did drop again we will transfuse 1 more unit  5. CKD -continue to monitor  6. Pregnancy at 15 weeks - patient is currently pre-viable. Will manage pregnancy accordingly. Will check FHT daily.  [facility] will see here in the hospital tomorrow  7. DVT prophylaxis - patient is very ambulatory, also has Ted hose.           Code Status Orders  (From admission, onward)        Start     Ordered   08/13/17 1955  Full code  Continuous     08/13/17 2007    Code Status History    Date Active Date Inactive Code Status Order ID Comments User Context   04/14/2017 0151 04/16/2017 1622 Full Code [id number]  Phillips Grout, MD ED             DVT Prophylaxis SCDs  Lab Results  Component Value Date   PLT 134 (L) 08/15/2017     Time Spent in minutes   50min Greater than 50% of time spent in care coordination and counseling patient regarding the condition and plan of care.   Dustin Flock M.D on 08/15/2017 at 1:48 PM  Between 7am to 6pm - Pager - [phone]  After 6pm go to [email or web address] - Proofreader  [employer]   Office  [phone]

## 2017-08-15 NOTE — Progress Notes (Signed)
Will see again in am following carotid study  Continue meds per Dr Marcelline Mates

## 2017-08-15 NOTE — Progress Notes (Signed)
Hematology/Oncology Consult note [facility]  Telephone:([phone] [phone]  Patient Care Team: Lin Landsman, MD as PCP - General (Family Medicine)   Name of the patient: Kelly Huff  [id number]  [birth year]   Date of visit: 08/15/2017   Interval history- she feels well other than some abdominal bloating. Leg swelling is better. She does not report any chest pain or sob. Blood sugars were low- 30's-40's this morning  Review of systems- Review of Systems  Constitutional: Negative for chills, fever, malaise/fatigue and weight loss.  HENT: Negative for congestion, ear discharge and nosebleeds.   Eyes: Negative for blurred vision.  Respiratory: Negative for cough, hemoptysis, sputum production, shortness of breath and wheezing.   Cardiovascular: Negative for chest pain, palpitations, orthopnea and claudication.  Gastrointestinal: Negative for abdominal pain, blood in stool, constipation, diarrhea, heartburn, melena, nausea and vomiting.       Abdominal bloating  Genitourinary: Negative for dysuria, flank pain, frequency, hematuria and urgency.  Musculoskeletal: Negative for back pain, joint pain and myalgias.  Skin: Negative for rash.  Neurological: Negative for dizziness, tingling, focal weakness, seizures, weakness and headaches.  Endo/Heme/Allergies: Does not bruise/bleed easily.  Psychiatric/Behavioral: Negative for depression and suicidal ideas. The patient does not have insomnia.        Allergies  Allergen Reactions  . Morphine     Other reaction(s): Hallucination  . Sulfamethoxazole-Trimethoprim Hives  . Trulicity [Dulaglutide]      Past Medical History:  Diagnosis Date  . Chronic kidney disease   . Diabetes mellitus without complication ([place])   . History of pre-eclampsia 2016   mild  . Hypertension      Past Surgical History:  Procedure Laterality Date  . DILATION AND CURETTAGE OF UTERUS     x 4    Social  History   Socioeconomic History  . Marital status: Single    Spouse name: Not on file  . Number of children: Not on file  . Years of education: Not on file  . Highest education level: Not on file  Occupational History  . Not on file  Social Needs  . Financial resource strain: Not on file  . Food insecurity:    Worry: Not on file    Inability: Not on file  . Transportation needs:    Medical: Not on file    Non-medical: Not on file  Tobacco Use  . Smoking status: Never Smoker  . Smokeless tobacco: Never Used  Substance and Sexual Activity  . Alcohol use: No  . Drug use: No  . Sexual activity: Yes    Birth control/protection: None  Lifestyle  . Physical activity:    Days per week: Not on file    Minutes per session: Not on file  . Stress: Not on file  Relationships  . Social connections:    Talks on phone: Not on file    Gets together: Not on file    Attends religious service: Not on file    Active member of club or organization: Not on file    Attends meetings of clubs or organizations: Not on file    Relationship status: Not on file  . Intimate partner violence:    Fear of current or ex partner: Not on file    Emotionally abused: Not on file    Physically abused: Not on file    Forced sexual activity: Not on file  Other Topics Concern  . Not on file  Social History Narrative  .  Not on file    Family History  Problem Relation Age of Onset  . Hypertension Mother   . Cancer Mother        Uterine vs cervical (pt unsure),   . Schizophrenia Brother      Current Facility-Administered Medications:  .  0.9 %  sodium chloride infusion, , Intravenous, Once, Dustin Flock, MD .  acetaminophen (TYLENOL) tablet 650 mg, 650 mg, Oral, Q6H PRN, Rubie Maid, MD .  aspirin EC tablet 81 mg, 81 mg, Oral, Daily, Rubie Maid, MD, 81 mg at 08/15/17 1053 .  calcium carbonate (TUMS - dosed in mg elemental calcium) chewable tablet 400 mg of elemental calcium, 2 tablet, Oral,  Q4H PRN, Rubie Maid, MD .  docusate sodium (COLACE) capsule 100 mg, 100 mg, Oral, Daily, Rubie Maid, MD, 100 mg at 08/15/17 1051 .  famotidine (PEPCID) tablet 10 mg, 10 mg, Oral, BID, Rubie Maid, MD, 10 mg at 08/15/17 1409 .  folic acid (FOLVITE) tablet 1 mg, 1 mg, Oral, Daily, Rubie Maid, MD, 1 mg at 08/15/17 1053 .  insulin aspart (novoLOG) injection 0-5 Units, 0-5 Units, Subcutaneous, QHS, Kalisetti, Radhika, MD .  insulin aspart (novoLOG) injection 0-9 Units, 0-9 Units, Subcutaneous, TID WC, Gladstone Lighter, MD, 9 Units at 08/15/17 1713 .  labetalol (NORMODYNE) tablet 300 mg, 300 mg, Oral, TID, Dustin Flock, MD, 300 mg at 08/15/17 1705 .  labetalol (NORMODYNE,TRANDATE) injection 10 mg, 10 mg, Intravenous, Q2H PRN, Rubie Maid, MD .  metoCLOPramide (REGLAN) tablet 10 mg, 10 mg, Oral, TID AC, Rubie Maid, MD, 10 mg at 08/15/17 1705 .  NIFEdipine (PROCARDIA-XL/ADALAT CC) 24 hr tablet 30 mg, 30 mg, Oral, BID, Rubie Maid, MD, 30 mg at 08/15/17 2020 .  ondansetron (ZOFRAN-ODT) disintegrating tablet 4 mg, 4 mg, Oral, Q8H PRN, Rubie Maid, MD .  predniSONE (DELTASONE) tablet 20 mg, 20 mg, Oral, BID WC, Rubie Maid, MD, 20 mg at 08/15/17 1053 .  prenatal multivitamin tablet 1 tablet, 1 tablet, Oral, Q1200, Rubie Maid, MD, 1 tablet at 08/14/17 1328 .  zolpidem (AMBIEN) tablet 5 mg, 5 mg, Oral, QHS PRN, Rubie Maid, MD  Physical exam:  Vitals:   08/15/17 1210 08/15/17 1418 08/15/17 1930 08/15/17 2000  BP: (!) 161/82 (!) 152/92 (!) 145/87   Pulse: 92 99 100 94  Resp: 16 18 20    Temp: 97.7 F (36.5 C) 98.2 F (36.8 C) 98.2 F (36.8 C)   TempSrc: Oral Oral Oral   SpO2: 100% 100% 100%   Weight:      Height:       Physical Exam  Constitutional: She is oriented to person, place, and time.  HENT:  Head: Normocephalic and atraumatic.  Eyes: Pupils are equal, round, and reactive to light. EOM are normal.  Neck: Normal range of motion.  Cardiovascular: Regular  rhythm and normal heart sounds.  tachycardic  Pulmonary/Chest: Effort normal and breath sounds normal.  Abdominal: Soft. Bowel sounds are normal.  Gravid  Musculoskeletal: She exhibits edema (trace. improved).  Neurological: She is alert and oriented to person, place, and time.  Skin: Skin is warm and dry.     CMP Latest Ref Rng & Units 08/15/2017  Glucose 65 - 99 mg/dL 112(H)  BUN 6 - 20 mg/dL 54(H)  Creatinine 0.44 - 1.00 mg/dL 2.30(H)  Sodium 135 - 145 mmol/L 134(L)  Potassium 3.5 - 5.1 mmol/L 4.1  Chloride 101 - 111 mmol/L 112(H)  CO2 22 - 32 mmol/L 19(L)  Calcium 8.9 - 10.3 mg/dL  7.7(L)  Total Protein 6.5 - 8.1 g/dL -  Total Bilirubin 0.3 - 1.2 mg/dL -  Alkaline Phos 38 - 126 U/L -  AST 15 - 41 U/L -  ALT 14 - 54 U/L -   CBC Latest Ref Rng & Units 08/15/2017  WBC 3.6 - 11.0 K/uL -  Hemoglobin 12.0 - 16.0 g/dL 8.2(L)  Hematocrit 35.0 - 47.0 % 23.9(L)  Platelets 150 - 440 K/uL -    Assessment and plan- Patient is a 32 y.o. 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 Hb dropped to 6.6 this morning and she is received 1 unit of prbc transfusion. She continues to be on prednisone 40 mg daily. Continue steroids. Haptoglobin pending. EPO level is 7. IVIG can help with hemolytic anemia and is safe in pregnancy. This is certainly a consideration at this time. I will defer this decision to Dr. Rogue Bussing in am.   2. 24 hour urine for urine protein may help to quantify degree of proteinuria since she has some features of nephritic syndrome.   Visit Diagnosis 1. Hypertension, unspecified type   2. Elevated troponin   3. Peripheral edema   4. Iron deficiency anemia during pregnancy      Dr. Randa Evens, MD, MPH [facility] [phone] 08/15/2017 9:54 PM

## 2017-08-16 ENCOUNTER — Observation Stay: Payer: Medicaid Other

## 2017-08-16 ENCOUNTER — Ambulatory Visit: Payer: Medicaid Other

## 2017-08-16 DIAGNOSIS — D599 Acquired hemolytic anemia, unspecified: Secondary | ICD-10-CM | POA: Diagnosis not present

## 2017-08-16 DIAGNOSIS — N183 Chronic kidney disease, stage 3 (moderate): Secondary | ICD-10-CM | POA: Diagnosis not present

## 2017-08-16 DIAGNOSIS — O99112 Other diseases of the blood and blood-forming organs and certain disorders involving the immune mechanism complicating pregnancy, second trimester: Secondary | ICD-10-CM | POA: Diagnosis not present

## 2017-08-16 DIAGNOSIS — D510 Vitamin B12 deficiency anemia due to intrinsic factor deficiency: Secondary | ICD-10-CM

## 2017-08-16 DIAGNOSIS — O10912 Unspecified pre-existing hypertension complicating pregnancy, second trimester: Secondary | ICD-10-CM | POA: Diagnosis not present

## 2017-08-16 DIAGNOSIS — O24012 Pre-existing diabetes mellitus, type 1, in pregnancy, second trimester: Secondary | ICD-10-CM | POA: Diagnosis not present

## 2017-08-16 DIAGNOSIS — E109 Type 1 diabetes mellitus without complications: Secondary | ICD-10-CM | POA: Diagnosis not present

## 2017-08-16 DIAGNOSIS — N189 Chronic kidney disease, unspecified: Secondary | ICD-10-CM | POA: Diagnosis not present

## 2017-08-16 LAB — BASIC METABOLIC PANEL
Anion gap: 5 (ref 5–15)
BUN: 56 mg/dL — ABNORMAL HIGH (ref 6–20)
CO2: 19 mmol/L — ABNORMAL LOW (ref 22–32)
Calcium: 8 mg/dL — ABNORMAL LOW (ref 8.9–10.3)
Chloride: 111 mmol/L (ref 101–111)
Creatinine, Ser: 2.17 mg/dL — ABNORMAL HIGH (ref 0.44–1.00)
GFR calc Af Amer: 33 mL/min — ABNORMAL LOW (ref >60)
GFR calc non Af Amer: 29 mL/min — ABNORMAL LOW (ref >60)
Glucose, Bld: 59 mg/dL — ABNORMAL LOW (ref 65–99)
Potassium: 3.9 mmol/L (ref 3.5–5.1)
Sodium: 135 mmol/L (ref 135–145)

## 2017-08-16 LAB — BPAM RBC
Blood Product Expiration Date: 201905092359
Blood Product Expiration Date: 201905142359
ISSUE DATE / TIME: 201904192317
ISSUE DATE / TIME: 201904211136
Unit Type and Rh: 7300
Unit Type and Rh: 7300

## 2017-08-16 LAB — TYPE AND SCREEN
ABO/RH(D): B POS
Antibody Screen: NEGATIVE
Unit division: 0
Unit division: 0

## 2017-08-16 LAB — GLUCOSE, CAPILLARY
Glucose-Capillary: 36 mg/dL — CL (ref 65–99)
Glucose-Capillary: 94 mg/dL (ref 65–99)
Glucose-Capillary: 47 mg/dL — ABNORMAL LOW (ref 65–99)
Glucose-Capillary: 64 mg/dL — ABNORMAL LOW (ref 65–99)
Glucose-Capillary: 86 mg/dL (ref 65–99)
Glucose-Capillary: 59 mg/dL — ABNORMAL LOW (ref 65–99)
Glucose-Capillary: 90 mg/dL (ref 65–99)
Glucose-Capillary: 136 mg/dL — ABNORMAL HIGH (ref 65–99)

## 2017-08-16 LAB — CBC
HCT: 23.5 % — ABNORMAL LOW (ref 35.0–47.0)
Hemoglobin: 8.1 g/dL — ABNORMAL LOW (ref 12.0–16.0)
MCH: 29.9 pg (ref 26.0–34.0)
MCHC: 34.5 g/dL (ref 32.0–36.0)
MCV: 86.5 fL (ref 80.0–100.0)
Platelets: 128 10*3/uL — ABNORMAL LOW (ref 150–440)
RBC: 2.71 MIL/uL — ABNORMAL LOW (ref 3.80–5.20)
RDW: 16.5 % — ABNORMAL HIGH (ref 11.5–14.5)
WBC: 18.9 10*3/uL — ABNORMAL HIGH (ref 3.6–11.0)

## 2017-08-16 LAB — PATHOLOGIST SMEAR REVIEW

## 2017-08-16 LAB — HAPTOGLOBIN: Haptoglobin: <10 mg/dL — ABNORMAL LOW (ref 34–200)

## 2017-08-16 MED ORDER — LABETALOL HCL 300 MG PO TABS: 300.0000 mg | ORAL_TABLET | Freq: Three times a day (TID) | ORAL | 1 refills | Status: DC

## 2017-08-16 MED ORDER — INSULIN PUMP
Freq: Every day | SUBCUTANEOUS | Status: DC
  Administered 2017-08-16: 14:00:00 1 via SUBCUTANEOUS
  Filled 2017-08-16: qty 1

## 2017-08-16 MED ORDER — IMMUNE GLOBULIN (HUMAN) 10 GM/100ML IV SOLN
1.0000 g/kg | INTRAVENOUS | Status: DC
  Administered 2017-08-16: 90 g via INTRAVENOUS
  Filled 2017-08-16: qty 900
  Filled 2017-08-16: qty 600

## 2017-08-16 MED ORDER — LABETALOL HCL 100 MG PO TABS
300.0000 mg | ORAL_TABLET | Freq: Three times a day (TID) | ORAL | Status: DC
  Administered 2017-08-16: 300 mg via ORAL
  Filled 2017-08-16 (×2): qty 3

## 2017-08-16 MED ORDER — METOCLOPRAMIDE HCL 10 MG PO TABS: 10.0000 mg | ORAL_TABLET | Freq: Three times a day (TID) | ORAL | 3 refills | Status: DC

## 2017-08-16 NOTE — Plan of Care (Signed)
Patient's vital signs stable; CBG ranged from 331 to 36 this shift (see CBG results for details); saline lock intact; site clear;  Voiding; tolerating modified carb controlled diabetic diet well; good po fluids; son at bedside during night; patient slept well at very long intervals tonight.

## 2017-08-16 NOTE — Progress Notes (Signed)
Kelly Huff   DOB:05/24/1985   QI#:[id number]    Subjective: Hemolytic anemia  Patient status post PRBC blood transfusion hemoglobin 8.1.  Complaints of mild shortness of breath with exertion.  Overall improved since admission.  No swelling in the legs.  Palpitations improved.  Objective:  Vitals:   08/16/17 1417 08/16/17 1450  BP: (!) 163/93 (!) 151/86  Pulse: (!) 103 99  Resp:    Temp:    SpO2: 100% 100%     Intake/Output Summary (Last 24 hours) at 08/16/2017 1717 Last data filed at 08/16/2017 1353 Gross per 24 hour  Intake 1200 ml  Output 3100 ml  Net -1900 ml    GENERAL Alert, no distress and comfortable.  EYES: no pallor or icterus OROPHARYNX: no thrush or ulceration. NECK: supple, no masses felt LYMPH:  no palpable lymphadenopathy in the cervical, axillary or inguinal regions LUNGS: decreased breath sounds to auscultation at bases and  No wheeze or crackles HEART/CVS: regular rate & rhythm and no murmurs; No lower extremity edema ABDOMEN: abdomen soft, tender  on deep palpation. and normal bowel sounds Musculoskeletal:no cyanosis of digits and no clubbing  PSYCH: alert & oriented x 3 with fluent speech NEURO: no focal motor/sensory deficits SKIN:  no rashes or significant lesions   Labs:  Lab Results  Component Value Date   WBC 18.9 (H) 08/16/2017   HGB 8.1 (L) 08/16/2017   HCT 23.5 (L) 08/16/2017   MCV 86.5 08/16/2017   PLT 128 (L) 08/16/2017   NEUTROABS 14.9 (H) 08/14/2017    Lab Results  Component Value Date   NA 135 08/16/2017   K 3.9 08/16/2017   CL 111 08/16/2017   CO2 19 (L) 08/16/2017    Studies:  No results found.  Assessment & Plan:   #32 year old female patient with severe anemia/chronic kidney disease; insulin-dependent diabetes second trimester pregnancy is admitted the hospital for worsening anemia  #Worsening of chronic anemia-the etiology is likely multifactorial/secondary to chronic kidney disease; and also hemolysis [less than  10 haptoglobin/mildly elevated LDH].  Interestingly patient's [place] test again come back negative.  However I do not suspect any nonimmune because of hemolysis.  I do not suspect any rapid acute hemolysis-given the absence of elevated bilirubin.  Poor tolerance to steroids 60 mg a day of prednisone [leg swelling/palpitations; started approximately week ago].  I recommend continued prednisone at 40 mg a day; however I would recommend starting IVIG 1 g/kg infusion x1 today; and based on hemoglobin plan 1 infusion again tomorrow.  Patient understands treatments are long/and there is a rare side effects of renal dysfunction headaches infusion reaction etc.  Generally thought to be safe in pregnancy.  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  Followed by hospitalists service.  #Platelets slightly low at 128-?  Immune related phenomenon; I do not suspect any microangiopathic hemolytic anemia.  Await possible response from IVIG.   #The above plan of care was discussed with the patient in detail.  She agrees.  Also informed Dr.Cherry.   Cammie Sickle, MD 08/16/2017  5:17 PM

## 2017-08-16 NOTE — Discharge Summary (Signed)
L&D OB Triage Note  SUBJECTIVE Kelly Huff is a 32 y.o. [id number] female at [redacted]w[redacted]d, EDD Estimated Date of Delivery: 02/04/18 who presented to triage with complaints of increased blood pressure and swelling..   OB History  Gravida Para Term Preterm AB Living  7 2 2  0 4 2  SAB TAB Ectopic Multiple Live Births  0 0 0 0 2    # Outcome Date GA Lbr Len/2nd Weight Sex Delivery Anes PTL Lv  7 Current           6 Term 06/2014 [redacted]w[redacted]d   M Vag-Spont   LIV     Complications: Mild pre-eclampsia  5 AB           4 AB           3 AB           2 AB           1 Term     M Vag-Spont   LIV    Medications Prior to Admission  Medication Sig Dispense Refill Last Dose  . aspirin EC 81 MG tablet Take 1 tablet (81 mg total) by mouth daily. Take after 12 weeks for prevention of preeclampssia later in pregnancy 300 tablet 2 08/13/2017 at 0800  . folic acid (FOLVITE) 1 MG tablet Take 1 tablet (1 mg total) by mouth daily. 90 tablet 1 08/13/2017 at 0800  . NIFEdipine (PROCARDIA-XL/ADALAT-CC/NIFEDICAL-XL) 30 MG 24 hr tablet Take 2 tablets (60 mg total) by mouth daily. (Patient taking differently: Take 30 mg by mouth 2 (two) times daily. ) 30 tablet 0 08/13/2017 at 1700  . NOVOLOG FLEXPEN 100 UNIT/ML FlexPen 0-9 units injected 3 times daily with meals according to the following scale: CBG 70-120 = 0 units CBG 121-150 = 1 unit CBG 151-200 = 2 units CBG 201-250 = 3 units CBG 251-300 = 5 units CBG 301-350 = 7 units CBG 351-400 = 9 units CBG >400 = call MD for advice 15 mL 0 As directed at As directed  . predniSONE (DELTASONE) 20 MG tablet Take 3 pills once a day x 2 weeks; and will taper based on response to hemoglobin. 80 tablet 0 08/13/2017 at 0800  . Prenatal Vit-Fe Fumarate-FA (MULTIVITAMIN-PRENATAL) 27-0.8 MG TABS tablet Take 1 tablet by mouth daily at 12 noon.   08/13/2017 at 0800  . meclizine (ANTIVERT) 25 MG tablet Take 1 tablet (25 mg total) by mouth 3 (three) times daily as needed. (Patient not taking:  Reported on 07/27/2017) 30 tablet 1 Not Taking at Unknown time  . ondansetron (ZOFRAN ODT) 4 MG disintegrating tablet Take 1 tablet (4 mg total) by mouth every 8 (eight) hours as needed for nausea. (Patient not taking: Reported on 08/12/2017) 30 tablet 2 Not Taking at Unknown time     OBJECTIVE  Nursing Evaluation:   BP (!) 175/88   Pulse (!) 107   LMP 04/30/2017    Findings:   Patient was found to be [redacted] weeks pregnant and had presented directly to the emergency department.  NST was performed and has been reviewed by me.  NST INTERPRETATION: Fetal heart tones noted appropriate for gestational age.  Mode: Doppler Baseline Rate (A): 155 bpm(fht)              ASSESSMENT Impression:  1.  Pregnancy:  [id number] at [redacted]w[redacted]d , EDD Estimated Date of Delivery: 02/04/18 2.   Hypertension patient triaged -acutely treated for hypertension and taken to the emergency department to be  seen.  PLAN 1.  Patient triaged to the emergency department after finding that she was previable.

## 2017-08-16 NOTE — Progress Notes (Signed)
Antenatal Progress Note  Subjective:     Patient ID: Kelly Huff is a 32 y.o. female at [redacted]w[redacted]d gestation, Estimated Date of Delivery: 02/04/18 by last menstrual period of 04/30/2017 consistent with 1st trimester sono who was admitted for uncontrolled cHTN in pregnancy, palpitations and leg swelling, and elevated troponin level.  Also with PMH of CKD, Type 1 DM.  HD# 3.   Subjective:  Patient reports she had an episode of hypoglycemia overnight.  She was hyperglycemic (blood sugar 330) last evening due to being without her insulin pump for several hours (had an empty cartridge and took several hours to get refilled).  She then received 4 units based on sliding scale until her cartridge arrived, then patient self-dosed an additional 9 units to cover remaining needs. Had hypoglycemic episode (47) at approximately 4 a.m. She was given juice which improved blood sugars.   Patient also states that she is ready to go home. 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   Review of Systems: Denies pelvic pain, vaginal bleeding.     Objective:   Vitals:   08/15/17 2000 08/15/17 2330 08/16/17 0430 08/16/17 0733  BP:  (!) 144/76 (!) 146/75 (!) 142/82  Pulse: 94 97 90 100  Resp:  20 20 18   Temp:  98.6 F (37 C) 98.5 F (36.9 C) 98.7 F (37.1 C)  TempSrc:  Oral Oral Oral  SpO2:  100% 100% 100%  Weight:      Height:        General appearance: alert and no distress Lungs: clear to auscultation bilaterally Heart: regular rate and rhythm, S1, S2 normal, no murmur, click, rub or gallop.  Abdomen: soft, non-tender; bowel sounds normal; no masses,  no organomegaly. Gravid.  Pelvic: deferred Extremities: extremities normal, atraumatic, no cyanosis or edema.  Ted hose on.   FHT appreciated: 158 bpm.   Labs:   CBC Latest Ref Rng & Units 08/16/2017 08/15/2017 08/15/2017  WBC 3.6 - 11.0 K/uL 18.9(H) - 18.5(H)  Hemoglobin 12.0 - 16.0 g/dL 8.1(L)  8.2(L) 6.6(L)  Hematocrit 35.0 - 47.0 % 23.5(L) 23.9(L) 19.2(L)  Platelets 150 - 440 K/uL 128(L) - 134(L)     Results for Kelly Huff (MRN [id number]) as of 08/16/2017 09:59  Ref. Range 08/16/2017 04:48  Sodium Latest Ref Range: 135 - 145 mmol/L 135  Potassium Latest Ref Range: 3.5 - 5.1 mmol/L 3.9  Chloride Latest Ref Range: 101 - 111 mmol/L 111  CO2 Latest Ref Range: 22 - 32 mmol/L 19 (L)  Glucose Latest Ref Range: 65 - 99 mg/dL 59 (L)  BUN Latest Ref Range: 6 - 20 mg/dL 56 (H)  Creatinine Latest Ref Range: 0.44 - 1.00 mg/dL 2.17 (H)  Calcium Latest Ref Range: 8.9 - 10.3 mg/dL 8.0 (L)  Anion gap Latest Ref Range: 5 - 15  5  GFR, Est Non African American Latest Ref Range: >60 mL/min 29 (L)  GFR, Est African American Latest Ref Range: >60 mL/min 33 (L)     08/13/2017 EKG: normal EKG, normal sinus rhythm.  08/14/2017 Echocardiogram:  Study Conclusions  - Left ventricle: The cavity size was normal. Systolic function was   normal. The estimated ejection fraction was in the range of 60%   to 65%. Wall motion was normal; there were no regional wall   motion abnormalities. Doppler parameters are consistent with   abnormal left ventricular relaxation (grade 1 diastolic   dysfunction). - Mitral valve: There was mild regurgitation. -  Left atrium: The atrium was normal in size. - Right ventricle: Systolic function was normal. - Pulmonary arteries: Systolic pressure was within the normal   range.   Assessment/Plan:  32 y.o. female [redacted]w[redacted]d, Estimated Date of Delivery: 02/04/18 with:     1. Chronic HTN in pregnancy - patient currently on Procardia 30 mg BID for HTN and initiated on Labetalol  during this admission. Currently Labetalol at 300 mg BID. BPs overall better controlled (was 180s/100s at admission, now down to 140s-150s/80s-90s).  Goal range is to keep BPs less than 160s/100s during pregnancy, preferably 150s/90s or less. Continue current management, and can titrate Labetalol  up as needed. Prednisone likely contributing to some elevations in BP as elevations coincided with initiation of steroids. BPs were previously well controlled on Procardia alone.    2. Elevated troponins - likely due to cardiac strain from elevated BPs and chronic anemia. Troponins trended downward over 24 hrs. Cardiology on board, is s/p overall normal echocardiogram (EF 65%, with mild mitral regurgitation which is a common finding in pregnancy).   Continues to have uneventful telemetry activity.  Remains asymptomatic.  Tachycardia overall well controlled, likely with Labetalol.  Cardiologist recommends a carotid vascular study due to findings of a broad carotid pulse.  To be performed this morning.   3. Type 1 DM - patient currently being managed with insulin pump (sees Endocrinologist for management of DM during current pregnancy). Had hypoglycemic episode this morning, preceded by hyperglycemia due to being without insulin pump. Patient has been seen by diabetes coordinator this morning. Recommends discontinuation of previously ordered sliding scale.  Continue to monitor blood sugars closely.  Currently on diabetic diet.  4. Anemia - currently undergoing workup for suspected hemolytic anemia with Hematology as outpatient.  Patient has received several blood transfusion during the pregnancy so far  (3 prior to current admission). Has since received 2 units during current admission. Hgb up to 8.  Continues to be relatively asymptomatic except with moderate exertion. Continue Prednisone. Hematology desires to start IVIG this morning.  Will require transfer to Medicine floor. Will arrange.  Hematology also considering recommendations for Procrit, however can have risks in pregnancy. Awaiting further lab evaluation, including epo levels.  5. CKD - patient followed by Nephrology, with no current active recommendations during the pregnancy thus far, will continue to closely monitor.  Creatinine has been  slightly up and down over the past 2 weeks but remains at relatively steady level.   6. Pregnancy at 15 weeks - patient is currently pre-viable. Will manage pregnancy accordingly. Continue to check FHT daily. For [facility] consultation today.   7. DVT prophylaxis - patient is very ambulatory, also has Ted hose.    Dispo: possible d/c home this evening, or in a.m. after IVIG treatment as blood count has remained stable and BPs better controlled. Will await final cardiology recommendations after carotid study.   Rubie Maid, MD [facility]

## 2017-08-16 NOTE — Progress Notes (Signed)
Diabetic Nurse Educator here to talk to patient and develop plan for continued care.  Insulin pump contract signed and BS worksheet given.

## 2017-08-16 NOTE — Progress Notes (Signed)
Talked with nurse in [facility] and Ultrasound tech on upcoming evaluations for today.  Both departments verified appointments and that they would take place today.  Pt verb u/o.

## 2017-08-16 NOTE — Progress Notes (Addendum)
Inpatient Diabetes Program Recommendations  Diabetes Treatment Program Recommendations  ADA Standards of Care 2019 Diabetes in Pregnancy Target Glucose Ranges:  Fasting: 60 - 90 mg/dL Preprandial: 60 - 105 mg/dL 1 hr postprandial: Less than 140mg /dL (from first bite of meal) 2 hr postprandial: Less than 120 mg/dL (from first bite of meal)    Results for SHARLON, PFOHL (MRN [id number]) as of 08/16/2017 07:34  Ref. Range 08/15/2017 08:09 08/15/2017 08:14 08/15/2017 09:13 08/15/2017 11:28 08/15/2017 17:03 08/15/2017 22:03 08/15/2017 23:26 08/16/2017 04:31 08/16/2017 05:10 08/16/2017 07:35  Glucose-Capillary Latest Ref Range: 65 - 99 mg/dL 45 (L) 39 (LL) 139 (H) 164 (H) 374 (H)  Novolog 9 units 331 (H)  Novolog 4 units  304 (H) 36 (LL) 94 47 (L)   Review of Glycemic Control  Diabetes history: DM1 (makes no insulin; requires basal, correction, and meal coverage insulin) Outpatient Diabetes medications: Insulin Pump with Novolog  Current orders for Inpatient glycemic control: Novolog 0-9 units TID with meals, Novolog 0-5 units QHS  Inpatient Diabetes Program Recommendations:  Insulin Pump: Please order Insulin Pump order set with CBGs and insulin pump frequecny of 5 times per day (fasting, 2 hours post prandial, and 2am). Correction (SSI): Please discontinue Novolog correction scale since patient is using her insulin pump for glycemic control.  NOTE: Spoke with Coralyn Mark, RN regarding glycemic control. Was informed patient's insulin pump pod ran out yesterday afternoon and patient went several hours without her insulin pump and glucose went up to 374 mg/dl by 17:03 on 08/15/17 so SQ insulin injection of 9 units was given. Patient resumed her insulin pump around 8pm last night and glucose noted to be 36 mg/dl this morning at 4:31 and 47 mg/dl at 7:35 am today. Noted fasting glucose low yesterday morning as well. Patient will likely need to have early morning basal rates adjusted which will need to come  directly from her Endocrinologist. Recommend discontinuing Novolog correction scale since patient has new pod and is using her insulin pump for glycemic control at this time. Will plan to talk with patient today.  Addendum 08/16/17@10 :40-Spoke with patient regarding diabetes and home regimen for diabetes management.  Patient states that she is followed by Dr. Buddy Duty for diabetes management and she last seen Dr. Buddy Duty this past Thursday on 08/12/17. Patient uses an Omni Pod insulin pump with Novolog insulin as an outpatient and is currently using insulin pump for inpatient glycemic control. Patient states that she has been on the Onmi Pod insulin pump for about 1 month and prior to that she was doing SQ insulin injections. Patient confirms that yesterday her pod ran out of insulin and she went a few hours without her insulin until her fiance brought in her new insulin pump pod and she restarted her insulin pump around 8pm last night. Inquired about hypoglycemia as an outpatient and patient states that she does not typically have hypoglycemia at home but feels that her glucose is getting low here at times because her carbohydrates are being limited. Patient was able to find insulin pump settings which were reviewed and are as follow:   Basal insulin  12A 1.35 units/hour Total daily basal insulin: 32.4 units/24 hours  Carb Coverage 1:4 1 unit for every 4 grams of carbohydrates  Insulin Sensitivity 1:10 1 unit drops blood glucose 10 mg/dl  Patient reports that when she seen Dr. Buddy Duty on 08/12/17, her basal rate was increased by 50% to current rate of 1.35 units/hour. Explained that due to early morning hypoglycemia for the  past 2 days, she may need to have basal rates adjusted during times her glucose is getting low. Discussed insulin pump policy while inpatient and explained that Inpatient Diabetes team will follow while inpatient and if she continues to experience hypoglycemia she will need to call Dr. Buddy Duty for  recommendations for insulin pump setting changes.  Explained that [place] will be checked by nursing staff 5 times per day (2am, 6am, 2 hour post prandial) with the hospital glucometer and informed patient she could also check with her personal glucometer if she would like as well. Patient reports that she is being transferred to room 114 today.  Patient verbalized understanding of information discussed and states that she does not have any further questions related to diabetes at this time. Talked with Shirlean Mylar, RN that currently has patient; printed insulin pump contract and flow sheet and asked that the contract be read and signed by patient and placed in chart. Explained that the printed insulin pump flow sheet was for patient to use at bedside and RNs will use to document in Epic. Also talked with Meghan, RN on 1C that will be receiving patient when she transfers to room 114.   NURSING: Once insulin pump order set is ordered please print off the Patient insulin pump contract and flow sheet. The insulin pump contract should be signed by the patient and then placed in the chart. The patient insulin pump flow sheet will be completed by the patient at the bedside and the RN caring for the patient will use the patient's flow sheet to document in the [facility]. RN will need to complete the Nursing Insulin Pump Flowsheet at least once a shift. Patient will need to keep extra insulin pump supplies at the bedside at all times.    Thanks, Barnie Alderman, RN, MSN, CDE Diabetes Coordinator Inpatient Diabetes Program [phone] (Team Pager from 8am to 5pm)

## 2017-08-16 NOTE — Progress Notes (Signed)
[place] [facility]                                                                                                                                                                                  Patient Demographics   Kelly Huff, is a 32 y.o. female, DOB - [birth year], SAY:[id number]  Admit date - 08/13/2017   Admitting Physician Rubie Maid, MD  Outpatient Primary MD for the patient is Lin Landsman, MD   LOS - 0  Subjective: Patient is feeling better and noted to have hypoglycemia  Review of Systems:   CONSTITUTIONAL: No documented fever.  Positive fatigue, weakness. No weight gain, no weight loss.  EYES: No blurry or double vision.  ENT: No tinnitus. No postnasal drip. No redness of the oropharynx.  RESPIRATORY: No cough, no wheeze, no hemoptysis. No dyspnea.  CARDIOVASCULAR: No chest pain. No orthopnea. No palpitations. No syncope.  GASTROINTESTINAL: No nausea, no vomiting or diarrhea. No abdominal pain. No melena or hematochezia.  GENITOURINARY: No dysuria or hematuria.  ENDOCRINE: No polyuria or nocturia. No heat or cold intolerance.  HEMATOLOGY: No anemia. No bruising. No bleeding.  INTEGUMENTARY: No rashes. No lesions.  MUSCULOSKELETAL: No arthritis. No swelling. No gout.  NEUROLOGIC: No numbness, tingling, or ataxia. No seizure-type activity.  PSYCHIATRIC: No anxiety. No insomnia. No ADD.    Vitals:   Vitals:   08/15/17 2330 08/16/17 0430 08/16/17 0733 08/16/17 1107  BP: (!) 144/76 (!) 146/75 (!) 142/82 (!) 160/89  Pulse: 97 90 100 (!) 101  Resp: 20 20 18    Temp: 98.6 F (37 C) 98.5 F (36.9 C) 98.7 F (37.1 C) 97.8 F (36.6 C)  TempSrc: Oral Oral Oral Oral  SpO2: 100% 100% 100% 100%  Weight:      Height:        Wt Readings from Last 3 Encounters:  08/13/17 91.6 kg (202 lb)  08/05/17 88.5 kg (195 lb 3.2 oz)  08/04/17 87.6 kg (193 lb 3.2 oz)     Intake/Output Summary (Last 24 hours) at 08/16/2017 1145 Last data  filed at 08/16/2017 1050 Gross per 24 hour  Intake 1280 ml  Output 3100 ml  Net -1820 ml    Physical Exam:   GENERAL: Pleasant-appearing in no apparent distress.  HEAD, EYES, EARS, NOSE AND THROAT: Atraumatic, normocephalic. Extraocular muscles are intact. Pupils equal and reactive to light. Sclerae anicteric. No conjunctival injection. No oro-pharyngeal erythema.  NECK: Supple. There is no jugular venous distention. No bruits, no lymphadenopathy, no thyromegaly.  HEART: Regular rate and rhythm,. No murmurs, no rubs, no clicks.  LUNGS: Clear to auscultation bilaterally. No rales or rhonchi. No wheezes.  ABDOMEN:  Soft, flat, nontender, nondistended. Has good bowel sounds. No hepatosplenomegaly appreciated.  EXTREMITIES: No evidence of any cyanosis, clubbing, or peripheral edema.  +2 pedal and radial pulses bilaterally.  NEUROLOGIC: The patient is alert, awake, and oriented x3 with no focal motor or sensory deficits appreciated bilaterally.  SKIN: Moist and warm with no rashes appreciated.  Psych: Not anxious, depressed LN: No inguinal LN enlargement    Antibiotics   Anti-infectives (From admission, onward)   None      Medications   Scheduled Meds: . aspirin EC  81 mg Oral Daily  . docusate sodium  100 mg Oral Daily  . famotidine  10 mg Oral BID  . folic acid  1 mg Oral Daily  . insulin pump   Subcutaneous 5 X Daily  . labetalol  300 mg Oral TID  . metoCLOPramide  10 mg Oral TID AC  . NIFEdipine  30 mg Oral BID  . predniSONE  20 mg Oral BID WC  . prenatal multivitamin  1 tablet Oral Q1200   Continuous Infusions: . sodium chloride    . IMMUNE GLOBULIN 10% (HUMAN) IV - For Fluid Restriction Only     PRN Meds:.acetaminophen, calcium carbonate, labetalol, ondansetron, zolpidem   Data Review:   Micro Results No results found for this or any previous visit (from the past 240 hour(s)).  Radiology Reports No results found.   CBC Recent Labs  Lab 08/11/17 1146  08/13/17 1726 08/14/17 0447 08/15/17 0504 08/15/17 1830 08/16/17 0448  WBC 23.8* 23.5* 16.4* 18.5*  --  18.9*  HGB 7.5* 7.0* 7.7* 6.6* 8.2* 8.1*  HCT 21.0* 20.7* 22.2* 19.2* 23.9* 23.5*  PLT 218 186 146* 134*  --  128*  MCV 85.2 87.3 87.2 87.6  --  86.5  MCH 30.2 29.6 30.2 30.2  --  29.9  MCHC 35.5 33.9 34.7 34.4  --  34.5  RDW 15.3* 16.6* 16.0* 16.1*  --  16.5*  LYMPHSABS 0.9*  --  1.3  --   --   --   MONOABS 1.0*  --  0.2  --   --   --   EOSABS 0.0  --  0.0  --   --   --   BASOSABS 0.0  --  0.0  --   --   --     Chemistries  Recent Labs  Lab 08/13/17 1726 08/15/17 0504 08/16/17 0448  NA 134* 134* 135  K 4.0 4.1 3.9  CL 110 112* 111  CO2 17* 19* 19*  GLUCOSE 133* 112* 59*  BUN 53* 54* 56*  CREATININE 2.10* 2.30* 2.17*  CALCIUM 8.2* 7.7* 8.0*  AST 26  --   --   ALT 19  --   --   ALKPHOS 78  --   --   BILITOT 0.4  --   --    ------------------------------------------------------------------------------------------------------------------ estimated creatinine clearance is 42.8 mL/min (A) (by C-G formula based on SCr of 2.17 mg/dL (H)). ------------------------------------------------------------------------------------------------------------------ No results for input(s): HGBA1C in the last 72 hours. ------------------------------------------------------------------------------------------------------------------ No results for input(s): CHOL, HDL, LDLCALC, TRIG, CHOLHDL, LDLDIRECT in the last 72 hours. ------------------------------------------------------------------------------------------------------------------ No results for input(s): TSH, T4TOTAL, T3FREE, THYROIDAB in the last 72 hours.  Invalid input(s): FREET3 ------------------------------------------------------------------------------------------------------------------ Recent Labs    08/14/17 1158  RETICCTPCT 8.0*    Coagulation profile No results for input(s): INR, PROTIME in the last 168  hours.  No results for input(s): DDIMER in the last 72 hours.  Cardiac Enzymes Recent Labs  Lab 08/13/17  1726 08/13/17 2319 08/14/17 0447  TROPONINI 0.22* 0.15* 0.12*   ------------------------------------------------------------------------------------------------------------------ Invalid input(s): POCBNP    Assessment & Plan   1. Uncontrolled HTN in pregnancy -  Continue labetalol 300 3 times daily she will need to go home on that blood pressure heart rate stable Continue Procardia  2. Elevated troponins - likely due to cardiac strain from elevated BPs and chronic anemia.  Normal ejection fraction no further work-up needed  3. Type 1 DM - patient currently being managed with insulin pump (sees Endocrinologist for management of DM during current pregnancy).  Patient states that she will be managing her pump now we will not give her any sliding scale insulin  4. 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 CKD -continue to monitor  6. Pregnancy at 15 weeks -per her primary OB doctor  7. DVT prophylaxis - patient is very ambulatory, also has Ted hose.           Code Status Orders  (From admission, onward)        Start     Ordered   08/13/17 1955  Full code  Continuous     08/13/17 2007    Code Status History    Date Active Date Inactive Code Status Order ID Comments User Context   04/14/2017 0151 04/16/2017 1622 Full Code [id number]  Phillips Grout, MD ED             DVT Prophylaxis SCDs  Lab Results  Component Value Date   PLT 128 (L) 08/16/2017     Time Spent in minutes   39min Greater than 50% of time spent in care coordination and counseling patient regarding the condition and plan of care.   Dustin Flock M.D on 08/16/2017 at 11:45 AM  Between 7am to 6pm - Pager - [phone]  After 6pm go to [email or web address] - [employer]  [employer]   Office  [phone]

## 2017-08-17 ENCOUNTER — Telehealth: Payer: Self-pay | Admitting: Internal Medicine

## 2017-08-17 ENCOUNTER — Telehealth: Payer: Self-pay | Admitting: Obstetrics and Gynecology

## 2017-08-17 LAB — DAT, POLYSPECIFIC AHG (ARMC ONLY): Polyspecific AHG test: NEGATIVE

## 2017-08-17 NOTE — Telephone Encounter (Signed)
The patient called and stated that she would like to speak with Dr. Marcelline Mates or her nurse in regards to her HCG levels being off/too high. Please advise.

## 2017-08-17 NOTE — Telephone Encounter (Signed)
Spoke with Dr. Marcelline Mates regarding the patient.  Patient is very complicated with multiple medical issues-severe anemia -multifactorial including hemolytic anemia [?]/CKD; brittle diabetes IDDM; chronic kidney disease unclear etiology.  Patient currently on prednisone/also status post IVIG x1 [1gm/k on 4/22].  I would also like to reach out to high risk maternal fetal medicine/[facility]-regarding getting II opinion with [facility] hematology. Pt sees MFM on 4/25.

## 2017-08-18 ENCOUNTER — Encounter: Payer: Self-pay | Admitting: Obstetrics and Gynecology

## 2017-08-18 ENCOUNTER — Telehealth: Payer: Self-pay | Admitting: Obstetrics and Gynecology

## 2017-08-18 ENCOUNTER — Inpatient Hospital Stay: Payer: Medicaid Other

## 2017-08-18 NOTE — Telephone Encounter (Signed)
Pt was called and informed that her message was given to Dr. Marcelline Mates and she was going to review her labs. Pt stated that she was scheduled to come in tomorrow for an appt.

## 2017-08-18 NOTE — Telephone Encounter (Signed)
Please see another encounter.

## 2017-08-18 NOTE — Telephone Encounter (Signed)
I'm not sure if this message was for this patient or if there was a misunderstanding of what labs the patient was asking for.  She has not undergone any labs recently that involve an HCG level.  I will f/u with her tomorrow during her visit.

## 2017-08-18 NOTE — Telephone Encounter (Signed)
The patient called and stated that she sent a message back yesterday in regards to her HCG levels and would like to speak with a nurse as soon as possible being that the patient is in a lot of discomfort. Please advise.

## 2017-08-19 ENCOUNTER — Institutional Professional Consult (permissible substitution): Payer: Medicaid Other

## 2017-08-19 ENCOUNTER — Other Ambulatory Visit: Payer: Self-pay | Admitting: Obstetrics and Gynecology

## 2017-08-19 ENCOUNTER — Ambulatory Visit
Admit: 2017-08-19 | Discharge: 2017-08-19 | Disposition: A | Discharge status: Home / Self Care | Payer: Medicaid Other | Attending: Obstetrics and Gynecology | Admitting: Obstetrics and Gynecology

## 2017-08-19 ENCOUNTER — Ambulatory Visit
Admission: RE | Admit: 2017-08-19 | Discharge: 2017-08-19 | Disposition: A | Discharge status: Home / Self Care | Payer: Medicaid Other | Source: Ambulatory Visit | Attending: Obstetrics and Gynecology | Admitting: Obstetrics and Gynecology

## 2017-08-19 ENCOUNTER — Encounter: Payer: Medicaid Other | Admitting: Obstetrics and Gynecology

## 2017-08-19 VITALS — BP 177/99 | HR 98 | Wt 212.6 lb

## 2017-08-19 DIAGNOSIS — D599 Acquired hemolytic anemia, unspecified: Secondary | ICD-10-CM | POA: Diagnosis not present

## 2017-08-19 DIAGNOSIS — I129 Hypertensive chronic kidney disease with stage 1 through stage 4 chronic kidney disease, or unspecified chronic kidney disease: Secondary | ICD-10-CM | POA: Diagnosis not present

## 2017-08-19 DIAGNOSIS — O161 Unspecified maternal hypertension, first trimester: Secondary | ICD-10-CM

## 2017-08-19 DIAGNOSIS — D589 Hereditary hemolytic anemia, unspecified: Secondary | ICD-10-CM | POA: Diagnosis not present

## 2017-08-19 DIAGNOSIS — R0602 Shortness of breath: Secondary | ICD-10-CM | POA: Diagnosis not present

## 2017-08-19 DIAGNOSIS — R7989 Other specified abnormal findings of blood chemistry: Secondary | ICD-10-CM | POA: Insufficient documentation

## 2017-08-19 DIAGNOSIS — O10919 Unspecified pre-existing hypertension complicating pregnancy, unspecified trimester: Secondary | ICD-10-CM | POA: Diagnosis not present

## 2017-08-19 DIAGNOSIS — O10212 Pre-existing hypertensive chronic kidney disease complicating pregnancy, second trimester: Secondary | ICD-10-CM | POA: Insufficient documentation

## 2017-08-19 DIAGNOSIS — Z3A15 15 weeks gestation of pregnancy: Secondary | ICD-10-CM

## 2017-08-19 DIAGNOSIS — E1022 Type 1 diabetes mellitus with diabetic chronic kidney disease: Secondary | ICD-10-CM | POA: Diagnosis not present

## 2017-08-19 DIAGNOSIS — R748 Abnormal levels of other serum enzymes: Secondary | ICD-10-CM | POA: Insufficient documentation

## 2017-08-19 DIAGNOSIS — E0590 Thyrotoxicosis, unspecified without thyrotoxic crisis or storm: Secondary | ICD-10-CM | POA: Insufficient documentation

## 2017-08-19 DIAGNOSIS — N189 Chronic kidney disease, unspecified: Secondary | ICD-10-CM | POA: Insufficient documentation

## 2017-08-19 DIAGNOSIS — O24019 Pre-existing diabetes mellitus, type 1, in pregnancy, unspecified trimester: Secondary | ICD-10-CM

## 2017-08-19 DIAGNOSIS — O99012 Anemia complicating pregnancy, second trimester: Secondary | ICD-10-CM | POA: Diagnosis not present

## 2017-08-19 DIAGNOSIS — O99282 Endocrine, nutritional and metabolic diseases complicating pregnancy, second trimester: Secondary | ICD-10-CM | POA: Diagnosis not present

## 2017-08-19 DIAGNOSIS — O24012 Pre-existing diabetes mellitus, type 1, in pregnancy, second trimester: Secondary | ICD-10-CM | POA: Insufficient documentation

## 2017-08-19 DIAGNOSIS — O162 Unspecified maternal hypertension, second trimester: Secondary | ICD-10-CM | POA: Insufficient documentation

## 2017-08-19 DIAGNOSIS — M3119 Other thrombotic microangiopathy: Secondary | ICD-10-CM | POA: Insufficient documentation

## 2017-08-19 NOTE — Discharge Summary (Signed)
Physician Discharge Summary  Patient ID: Kelly Huff MRN: [id number] DOB/AGE: 34/13/1987 34 y.o.  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 [phone] female  Who presented to the Emergency Room at  [redacted] weeks gestation with complaint of peripheral swelling, tachycardia, and elevated blood pressures, symptoms that had been ongoing for approximately 1 week since starting her prednisone. She had a PMH significant for CKD, hemolytic anemia (had recently initiated prednisone this week for treatment), chronic hypertension in pregnancy (on Procardia 30 mg BID), and Type 1 DM (on insulin pump).  Upon initial evaluation her blood pressure was noted tobe 189/109, she was tachycardia around 115-130, and mild lower extremity edema.  Her labs were significant for an elevated troponin of 0.22. She also was noted to have a Hgb of 7.0.  She was admitted to the Gynecology service and consulted by the Hospitalist and Cardiologist.  Patient's troponins trended downward by early HD#2.  The patient had an echocardiogram which noted a normal ejection fraction (65%) with only mild mitral regurgitation present.  She received a blood transfusion of 1 unit of PRBCs on recommendations of the Hematology service who was also consulted, which improved Hgb to 7.7. Patient managed her blood sugars with her insulin pump. She was initiated on Labetalol for better control of her blood pressures.  Her prednisone  course was continued throughout her hospitalization. On HD#3, her anemia worsened (trended downward to 6.6), and recommendations were for her to receive a second unit of PRBCs which she received.  Cardiologist recommended carotid doppler studies due to findings of a broad carotid pulse, which was performed on HD#4.  She was also recommended to begin a course of IVIG on HD#4.  After completion of her IVIG treatment, she was able to be discharged home.   Consults: cardiology, hematology/oncology and Internal medicine  Significant Diagnostic Studies: labs, and cardiac graphics: Echocardiogram: normal  Results for orders placed or performed during the hospital encounter of 08/13/17  CBC  Result Value Ref Range   WBC 23.5 (H) 3.6 - 11.0 K/uL   RBC 2.38 (L) 3.80 - 5.20 MIL/uL   Hemoglobin 7.0 (L) 12.0 - 16.0 g/dL   HCT 20.7 (L) 35.0 - 47.0 %   MCV 87.3 80.0 - 100.0 fL   MCH 29.6 26.0 - 34.0 pg   MCHC 33.9 32.0 - 36.0 g/dL   RDW 16.6 (H) 11.5 - 14.5 %   Platelets 186 150 - 440 K/uL  Comprehensive metabolic panel  Result Value Ref Range   Sodium 134 (L) 135 - 145 mmol/L   Potassium 4.0 3.5 - 5.1 mmol/L   Chloride 110 101 - 111 mmol/L   CO2 17 (L) 22 - 32 mmol/L   Glucose, Bld 133 (H) 65 - 99 mg/dL   BUN 53 (H) 6 - 20 mg/dL   Creatinine, Ser 2.10 (H) 0.44 - 1.00 mg/dL   Calcium 8.2 (L) 8.9 - 10.3 mg/dL   Total Protein 6.1 (L) 6.5 - 8.1 g/dL   Albumin 2.6 (L) 3.5 - 5.0 g/dL  AST 26 15 - 41 U/L   ALT 19 14 - 54 U/L   Alkaline Phosphatase 78 38 - 126 U/L   Total Bilirubin 0.4 0.3 - 1.2 mg/dL   GFR calc non Af Amer 30 (L) >60 mL/min   GFR calc Af Amer 35 (L) >60 mL/min   Anion gap 7 5 - 15  Urinalysis, Complete w Microscopic  Result Value Ref Range   Color, Urine YELLOW (A) YELLOW   APPearance HAZY (A) CLEAR   Specific Gravity, Urine 1.014 1.005 - 1.030   pH 5.0 5.0 - 8.0   Glucose, UA 150 (A) NEGATIVE mg/dL   Hgb urine dipstick MODERATE (A) NEGATIVE   Bilirubin Urine NEGATIVE  NEGATIVE   Ketones, ur NEGATIVE NEGATIVE mg/dL   Protein, ur >=300 (A) NEGATIVE mg/dL   Nitrite NEGATIVE NEGATIVE   Leukocytes, UA NEGATIVE NEGATIVE   RBC / HPF 6-30 0 - 5 RBC/hpf   WBC, UA 0-5 0 - 5 WBC/hpf   Bacteria, UA RARE (A) NONE SEEN   Squamous Epithelial / LPF 0-5 (A) NONE SEEN   Mucus PRESENT    Granular Casts, UA PRESENT   Troponin I  Result Value Ref Range   Troponin I 0.22 (HH) <0.03 ng/mL  Troponin I  Result Value Ref Range   Troponin I 0.15 (HH) <0.03 ng/mL  Troponin I  Result Value Ref Range   Troponin I 0.12 (HH) <0.03 ng/mL  Glucose, capillary  Result Value Ref Range   Glucose-Capillary 144 (H) 65 - 99 mg/dL  CBC with Differential/Platelet  Result Value Ref Range   WBC 16.4 (H) 3.6 - 11.0 K/uL   RBC 2.55 (L) 3.80 - 5.20 MIL/uL   Hemoglobin 7.7 (L) 12.0 - 16.0 g/dL   HCT 22.2 (L) 35.0 - 47.0 %   MCV 87.2 80.0 - 100.0 fL   MCH 30.2 26.0 - 34.0 pg   MCHC 34.7 32.0 - 36.0 g/dL   RDW 16.0 (H) 11.5 - 14.5 %   Platelets 146 (L) 150 - 440 K/uL   Neutrophils Relative % 85 %   Lymphocytes Relative 8 %   Monocytes Relative 1 %   Eosinophils Relative 0 %   Basophils Relative 0 %   Band Neutrophils 4 %   Metamyelocytes Relative 1 %   Myelocytes 1 %   Promyelocytes Relative 0 %   Blasts 0 %   nRBC 0 0 /100 WBC   Other 0 %   Neutro Abs 14.9 (H) 1.4 - 6.5 K/uL   Lymphs Abs 1.3 1.0 - 3.6 K/uL   Monocytes Absolute 0.2 0.2 - 0.9 K/uL   Eosinophils Absolute 0.0 0 - 0.7 K/uL   Basophils Absolute 0.0 0 - 0.1 K/uL  Pathologist smear review  Result Value Ref Range   Path Review      Smear review shows normocytic anemia, leukocytosis and mild thrombocytopenia. Negative for increases schistocytes. Mild left shift in granulocytes and hypersegmented neutrophils. Dr. Luana Shu.  Lactate dehydrogenase  Result Value Ref Range   LDH 434 (H) 98 - 192 U/L  Reticulocytes  Result Value Ref Range   Retic Ct Pct 8.0 (H) 0.4 - 3.1 %   RBC. 2.74 (L) 3.80 - 5.20 MIL/uL   Retic  Count, Absolute 219.2 (H) 19.0 - 183.0 K/uL  Haptoglobin  Result Value Ref Range   Haptoglobin <10 (L) 34 - 200 mg/dL  Erythropoietin  Result Value Ref Range   Erythropoietin 7.0 2.6 - 18.5 mIU/mL  Glucose, capillary  Result  Value Ref Range   Glucose-Capillary 173 (H) 65 - 99 mg/dL  CBC  Result Value Ref Range   WBC 18.5 (H) 3.6 - 11.0 K/uL   RBC 2.19 (L) 3.80 - 5.20 MIL/uL   Hemoglobin 6.6 (L) 12.0 - 16.0 g/dL   HCT 19.2 (L) 35.0 - 47.0 %   MCV 87.6 80.0 - 100.0 fL   MCH 30.2 26.0 - 34.0 pg   MCHC 34.4 32.0 - 36.0 g/dL   RDW 16.1 (H) 11.5 - 14.5 %   Platelets 134 (L) 150 - 440 K/uL  Basic metabolic panel  Result Value Ref Range   Sodium 134 (L) 135 - 145 mmol/L   Potassium 4.1 3.5 - 5.1 mmol/L   Chloride 112 (H) 101 - 111 mmol/L   CO2 19 (L) 22 - 32 mmol/L   Glucose, Bld 112 (H) 65 - 99 mg/dL   BUN 54 (H) 6 - 20 mg/dL   Creatinine, Ser 2.30 (H) 0.44 - 1.00 mg/dL   Calcium 7.7 (L) 8.9 - 10.3 mg/dL   GFR calc non Af Amer 27 (L) >60 mL/min   GFR calc Af Amer 31 (L) >60 mL/min   Anion gap 3 (L) 5 - 15  Glucose, capillary  Result Value Ref Range   Glucose-Capillary 101 (H) 65 - 99 mg/dL  Glucose, capillary  Result Value Ref Range   Glucose-Capillary 161 (H) 65 - 99 mg/dL  Glucose, capillary  Result Value Ref Range   Glucose-Capillary 45 (L) 65 - 99 mg/dL  Glucose, capillary  Result Value Ref Range   Glucose-Capillary 39 (LL) 65 - 99 mg/dL   Comment 1 Notify RN   Glucose, capillary  Result Value Ref Range   Glucose-Capillary 139 (H) 65 - 99 mg/dL  Glucose, capillary  Result Value Ref Range   Glucose-Capillary 164 (H) 65 - 99 mg/dL  CBC  Result Value Ref Range   WBC 18.9 (H) 3.6 - 11.0 K/uL   RBC 2.71 (L) 3.80 - 5.20 MIL/uL   Hemoglobin 8.1 (L) 12.0 - 16.0 g/dL   HCT 23.5 (L) 35.0 - 47.0 %   MCV 86.5 80.0 - 100.0 fL   MCH 29.9 26.0 - 34.0 pg   MCHC 34.5 32.0 - 36.0 g/dL   RDW 16.5 (H) 11.5 - 14.5 %   Platelets 128 (L) 150 - 440 K/uL  Basic metabolic panel   Result Value Ref Range   Sodium 135 135 - 145 mmol/L   Potassium 3.9 3.5 - 5.1 mmol/L   Chloride 111 101 - 111 mmol/L   CO2 19 (L) 22 - 32 mmol/L   Glucose, Bld 59 (L) 65 - 99 mg/dL   BUN 56 (H) 6 - 20 mg/dL   Creatinine, Ser 2.17 (H) 0.44 - 1.00 mg/dL   Calcium 8.0 (L) 8.9 - 10.3 mg/dL   GFR calc non Af Amer 29 (L) >60 mL/min   GFR calc Af Amer 33 (L) >60 mL/min   Anion gap 5 5 - 15  Hemoglobin and hematocrit, blood  Result Value Ref Range   Hemoglobin 8.2 (L) 12.0 - 16.0 g/dL   HCT 23.9 (L) 35.0 - 47.0 %  Glucose, capillary  Result Value Ref Range   Glucose-Capillary 374 (H) 65 - 99 mg/dL  Glucose, capillary  Result Value Ref Range   Glucose-Capillary 331 (H) 65 - 99 mg/dL   Comment 1 Notify RN    Comment 2 Document in Chart    Comment 3 Call MD NNP PA CNM  Glucose, capillary  Result Value Ref Range   Glucose-Capillary 304 (H) 65 - 99 mg/dL  Glucose, capillary  Result Value Ref Range   Glucose-Capillary 36 (LL) 65 - 99 mg/dL   Comment 1 Notify RN    Comment 2 Document in Chart    Comment 3 Call MD NNP PA CNM   Glucose, capillary  Result Value Ref Range   Glucose-Capillary 94 65 - 99 mg/dL  Glucose, capillary  Result Value Ref Range   Glucose-Capillary 47 (L) 65 - 99 mg/dL  Glucose, capillary  Result Value Ref Range   Glucose-Capillary 64 (L) 65 - 99 mg/dL   Comment 1 Notify RN   Glucose, capillary  Result Value Ref Range   Glucose-Capillary 86 65 - 99 mg/dL   Comment 1 Notify RN   Glucose, capillary  Result Value Ref Range   Glucose-Capillary 59 (L) 65 - 99 mg/dL  Glucose, capillary  Result Value Ref Range   Glucose-Capillary 90 65 - 99 mg/dL  Glucose, capillary  Result Value Ref Range   Glucose-Capillary 136 (H) 65 - 99 mg/dL  ECHOCARDIOGRAM COMPLETE  Result Value Ref Range   Weight 3,232 oz   Height 67 in   BP 146/75 mmHg  Type and screen [place]  Result Value Ref Range   ABO/RH(D) B POS    Antibody Screen NEG    Sample  Expiration 08/16/2017    Unit Number [id number]    Blood Component Type RED CELLS,LR    Unit division 00    Status of Unit ISSUED,FINAL    Transfusion Status OK TO TRANSFUSE    Crossmatch Result Compatible    Unit Number [id number]    Blood Component Type RED CELLS,LR    Unit division 00    Status of Unit ISSUED,FINAL    Transfusion Status OK TO TRANSFUSE    Crossmatch Result      Compatible Performed at [facility], [address], [place], [place] [address]   Prepare [facility]  Result Value Ref Range   Order Confirmation      ORDER PROCESSED BY BLOOD BANK Performed at [facility], [address], [place], [place] [address]   DAT, polyspecific, AHG [facility] only)  Result Value Ref Range   Polyspecific AHG test      NEG Performed at [facility], [address] [place], [place] [address]    Antibody ID,T [place]      NO ANTIBODY DEMONSTRATED IN ELU[place] Performed at [facility], [address] [place], [place] [address]   Prepare RBC  Result Value Ref Range   Order Confirmation      ORDER PROCESSED BY BLOOD BANK Performed at [facility], [place], [place], [place] [address]   BPAM [facility]  Result Value Ref Range   ISSUE DATE / TIME [id number]    Blood Product Unit Number [id number]    PRODUCT CODE [id number]    Unit Type and Rh 7300    Blood Product Expiration Date [id number]    ISSUE DATE / TIME [id number]    Blood Product Unit Number [id number]    PRODUCT CODE [id number]    Unit Type and Rh 7300    Blood Product Expiration Date [id number]       Imaging:   08/13/2017 EKG: normal EKG, normal sinus rhythm, there are no previous tracings available for comparison.  Echocardiogram Study Conclusions (08/14/2017)  - Left ventricle: The cavity size was normal. Systolic function was  normal. The estimated ejection fraction was in the range of 60%   to 65%. Wall motion was normal; there were no regional  wall   motion abnormalities. Doppler parameters are consistent with   abnormal left ventricular relaxation (grade 1 diastolic   dysfunction). - Mitral valve: There was mild regurgitation. - Left atrium: The atrium was normal in size. - Right ventricle: Systolic function was normal. - Pulmonary arteries: Systolic pressure was within the normal   range.    08/16/2017 Carotid Dopplers RIGHT CAROTID ARTERY: No focal plaque identified. No evidence of carotid stenosis in the right neck.  RIGHT VERTEBRAL ARTERY: Antegrade flow with normal waveform and velocity.  LEFT CAROTID ARTERY: No focal plaque identified. No evidence of carotid stenosis in the left neck.  LEFT VERTEBRAL ARTERY: Antegrade flow with normal waveform and velocity.  IMPRESSION: Normal carotid duplex ultrasound demonstrating no evidence of bilateral carotid plaque or stenosis.    Treatments: cardiac meds: labetolol and nifedipine, insulin: Humalog, and blood transfusion, 2 units PRBCs, and IVIG; corticosteroids  Discharge Exam: Blood pressure (!) 151/86, pulse 99, temperature 97.8 F (36.6 C), temperature source Oral, resp. rate 18, height 5\' 7"  (1.702 m), weight 202 lb (91.6 kg), last menstrual period 04/30/2017, SpO2 100 %. General appearance: alert and no distress Neck: no adenopathy, no carotid bruit, no JVD, supple, symmetrical, trachea midline and thyroid not enlarged, symmetric, no tenderness/mass/nodules Resp: clear to auscultation bilaterally Chest wall: no tenderness Cardio: regular rate and rhythm, S1, S2 normal, no murmur, click, rub or gallop GI: soft, non-tender; bowel sounds normal; no masses,  no organomegaly Pelvic: deferred Extremities: extremities normal, atraumatic, no cyanosis or edema Pulses: 2+ and symmetric Skin: Skin color, texture, turgor normal. No rashes or lesions Neurologic: Grossly normal  Disposition:   Discharge Instructions    Complete patient signature process for  consent form   Complete by:  Aug 13, 2017    Practitioner attestation of consent   Complete by:  Aug 13, 2017    I, the ordering practitioner, attest that I have discussed with the patient the benefits, risks, side effects, alternatives, likelihood of achieving goals and potential problems during recovery for the procedure listed.   Procedure:  Blood Product(s)   Care order/instruction   Complete by:  As directed    Transfuse Parameters     Allergies as of 08/16/2017      Reactions   Morphine    Other reaction(s): Hallucination   Sulfamethoxazole-trimethoprim Hives   Trulicity [dulaglutide]       Medication List    STOP taking these medications   meclizine 25 MG tablet Commonly known as:  ANTIVERT   ondansetron 4 MG disintegrating tablet Commonly known as:  ZOFRAN ODT     TAKE these medications   aspirin EC 81 MG tablet Take 1 tablet (81 mg total) by mouth daily. Take after 12 weeks for prevention of preeclampssia later in pregnancy   folic acid 1 MG tablet Commonly known as:  FOLVITE Take 1 tablet (1 mg total) by mouth daily.   labetalol 300 MG tablet Commonly known as:  NORMODYNE Take 1 tablet (300 mg total) by mouth 3 (three) times daily.   metoCLOPramide 10 MG tablet Commonly known as:  REGLAN Take 1 tablet (10 mg total) by mouth 3 (three) times daily before meals.   multivitamin-prenatal 27-0.8 MG Tabs tablet Take 1 tablet by mouth daily at 12 noon.   NIFEdipine 30 MG 24 hr tablet Commonly known as:  PROCARDIA-XL/ADALAT-CC/NIFEDICAL-XL Take 2  tablets (60 mg total) by mouth daily. What changed:    how much to take  when to take this   NOVOLOG FLEXPEN 100 UNIT/ML FlexPen Generic drug:  insulin aspart 0-9 units injected 3 times daily with meals according to the following scale: CBG 70-120 = 0 units CBG 121-150 = 1 unit CBG 151-200 = 2 units CBG 201-250 = 3 units CBG 251-300 = 5 units CBG 301-350 = 7 units CBG 351-400 = 9 units CBG >400 = call MD for  advice   predniSONE 20 MG tablet Commonly known as:  DELTASONE Take 3 pills once a day x 2 weeks; and will taper based on response to hemoglobin.      Follow-up Information    Cammie Sickle, MD Follow up.   Specialties:  Internal Medicine, Oncology Why:  Followup for IVIG Infusion outpatient Please call for follow up appointment  Contact information: [place] [address] [phone]        Rubie Maid, MD Follow up.   Specialties:  Obstetrics and Gynecology, Radiology Why:  Already has appointment this week.  Contact information: [address] [place] [address] [phone]        [place] Follow up.   Specialty:  Perinatology Why:  Has appointment on Thursday, 08/19/2017 3PM Contact information: [place] [phone]          Signed: Rubie Maid 08/19/2017, 10:16 PM

## 2017-08-19 NOTE — Progress Notes (Signed)
MFM Follow up Consult  CC: Shortness of breath HPI:  32 y.o. [id number] @ [redacted]w[redacted]d (by [redacted]w[redacted]d [place] on 06/14/17). Pregnancy c/b: IDDM (on pump), abnormal TFTs (no medication), HTN (requiring 2 agents), hemolytic anemia diagnosed with onset of the pregnancy after an evaluation for dizziness (s/p 5 single unit transfusions, prednisone and IVIG ), elevated troponin with normal EKG and maternal echocardiogram, chronic kidney disease (diagnosed with acute kidney disease after starting Trulicity in December 6333 but without recovery; creatinine remains >2).  Presents for follow up visit to review her medications and multiple co-morbidities, the majority of which have been diagnosed or exacerbated since her pregnancy.  Her main complaint is her shortness of breath and inability to walk, lie flat or climb stairs without being uncomfortable breathing.     PMHx: Patient  has a past medical history of Chronic kidney disease, Diabetes mellitus without complication ([place]) on pump, History of pre-eclampsia without severe features (2016), Hypertension, Hyperthyroidism, hemolytic anemia and elevated troponin levels.  PSHx: She  has a past surgical history that includes Dilation and curettage of uterus x 2 (elective abortions) Medications:  Labetalol 300mg  TID Procardia 30mg  XL BID Folic acid 1 mg PNV ASA 81 mg daily Reglan (PRN N/V) Flexeril (PRN) Insulin pump Prednisone 20mg  BID Allergies: is allergic to morphine; sulfamethoxazole-trimethoprim; and trulicity [dulaglutide]. OBHx:  2004 NVD (spontaneous labor) at 73 weeks, female, 5#5oz 2006 TAB (D&C) 2008 TAB (D&C) 2016 NVD (IOL at 37 weeks for diabetes and HTN), female 5#15oz 2019 current (different FOB) GYNHx: History of abnormal pap smears: No.       FHx: family history includes Cancer in her mother; Hypertension in her mother; Schizophrenia in her brother. Soc Hx:  reports that she has never smoked. She has never used smokeless tobacco. She reports that she does not  drink alcohol or use drugs.     FOB is involved with the pregnancy  Objective:  BP (!) 177/99 (BP Location: Right Arm)   Pulse 98   Wt 212 lb 9.6 oz (96.4 kg)   LMP 04/30/2017   SpO2 100%   BMI 33.30 kg/m   Repeat BP 170/101 General: Well nourished, well developed female in mild discomfort, cannot lie flat for [place].  Skin:  Warm and dry, increase in acne since pregnancy, LE edema (unable to wear normal shoes) Cardiovascular:Regular rate and rhythm, mild tachycardia. Respiratory:  Clear to auscultation bilateral. Normal respiratory effort Abdomen: soft, NT, gravid Neuro/Psych:  Normal mood and affect.   [place] today demonstrates single IUP with unremarkable posterior placenta, composite biometry c/w 17 weeks 0 days.  A1C 4.7 08/12/17 (Dr. Cindra Eves office; [facility], [facility]) Creatinine 2.17 (4/22) H/H 8.1/23.5 (4/22) Plts 128 (4/22)  Erythropoietin 7 (nl) 4/20 Haptoglobin <10 (low) 4/20 LDH 434 (high) 4/20 DAT neg (4/20) Peripheral smear:  Normocytic anemia, mild thrombocytopenia, negative for increase in schistocytes, mild left shift in granulocytes and hypersegmented neutrophils Troponin I 0.12 (high) 4/20 (down from 0.22 on 4/19) TSH 0.044 (low) 4/11 T4, total 16.1 (high) T3 uptake ratio 20 (low) Free thyroxine index 3.2 (nl)  Also underwent iron, TIBC, ferritin, folate, B12 which were normal in March, 2019. ANA and C4 and C3 were also negative March, 2019  Hemoglobin electrophoresis was normal  Cell free fetal DNA screen was normal  Perinatal info:  B POS/ R imm / TDaP not yet given / RPR NR / HIV NR/ HBsAg neg  Assessment & Plan:  32 y.o. [id number] @ [redacted]w[redacted]d (by [redacted]w[redacted]d [place]) with multiple medical issues.  1.  HTN - recently increased labetalol to 300mg  TID and procardia to 30mg  XL BID - BP 170/102 and 177/99 today.  - recommend evaluation of proteinuria with 24hr urine or p/c ratio  - s/p normal maternal echo and EKG  - elevated troponins have been attributed to her  kidney disease and HTN but given increasing shortness of breath, recommend repeat evaluation  - continue ASA 2.  IDDM - well controlled on insulin pump  - cared for by Dr. Delrae Rend, [place], [facility] (phone # [phone])  - recommend detailed anatomy US at 18 weeks  - recommend fetal echo at 22 weeks  - serial [place] for growth after 28 weeks  - did not ask about most recent optho exam  - had a maternal echo 3.  Abnormal TFTs  - has not previously been diagnosed with hyperthyroidism  - Thyroid stimulating immunoglobulin, thyrotropin receptor Ab were done by Dr. Buddy Duty and were negative  - may be contributing to some of her symptoms - consider trial of methimazole given she is in her 2nd trimester (already on beta-blocker) 4.  Hemolytic anemia (autoimmune?)  - has had an evaluation by Hematology at [facility]- started on prednisone and recently started on IVIG (this past Saturday).  Normal hemoglobin electrophoresis and iron studies, has low haptoglobin and elevated LDH, negative complement levels.  - s/p 5 single unit blood transfusions.  Presented with hemoglobin of 5.9.  - plt now also low at 128 5.  Chronic kidney disease with creatinine >2  - diagnosed with acute kidney disease in December after being prescribed Trulicity by PCP for diabetes.  Patient experienced hypoglycemia so stopped the insulin she was taking at the time and experienced BS in the 700s - was admitted and diagnosed with kidney failure which hasn't completely resolved.  Diabetes control much improved since starting on a pump in February.  - chronic kidney disease can worsen during pregnancy and can be associated with fetal growth restriction, HTN and development of preeclampsia, any of which may lead to preterm delivery or stillbirth 6.  Elevated troponin  - had carotid duplex and echocardiogram and EKG which were normal; attributed to HTN and chronic kidney disease by cardiology at [facility]  Given complicated medical  history requiring multiple subspecialists, and onset occurring with the diagnosis of the pregnancy, bedside US performed to rule out molar pregnancy.  No sonographic evidence of mole and cell free fetal DNA was reported to be normal.  Recommend admission to [facility] for further evaluation of HTN, kidney disease, thyroid disease and hemolytic anemia.  Also recommend further evaluation of shortness of breath.  We discussed Ms. Barksdale's desire for this pregnancy; if not deemed a threat to maternal life and her multiple medical issues may be managed, she would very much like to continue the pregnancy.  However, she is very pragmatic and would consider termination if recommended or the pregnancy was considered a threat to her life.

## 2017-08-20 ENCOUNTER — Other Ambulatory Visit: Payer: Self-pay | Admitting: Obstetrics and Gynecology

## 2017-08-20 MED ORDER — NIFEDIPINE ER OSMOTIC RELEASE 30 MG PO TB24: 30.0000 mg | ORAL_TABLET | Freq: Two times a day (BID) | ORAL | 6 refills | Status: DC

## 2017-08-23 ENCOUNTER — Ambulatory Visit: Payer: Medicaid Other

## 2017-08-24 ENCOUNTER — Telehealth: Payer: Self-pay | Admitting: Dietician

## 2017-08-24 NOTE — Telephone Encounter (Signed)
Called patient, as we have not heard back from her since she cancelled her 08/11/17 RD appointment. She is unable to reschedule at this time. Will notify referring provider.

## 2017-08-25 ENCOUNTER — Other Ambulatory Visit: Payer: Self-pay | Admitting: *Deleted

## 2017-08-25 ENCOUNTER — Inpatient Hospital Stay: Payer: Medicaid Other | Admitting: Internal Medicine

## 2017-08-25 ENCOUNTER — Encounter: Payer: Self-pay | Admitting: Dietician

## 2017-08-25 ENCOUNTER — Inpatient Hospital Stay: Payer: Medicaid Other

## 2017-08-25 DIAGNOSIS — D5932 Hereditary hemolytic-uremic syndrome: Secondary | ICD-10-CM

## 2017-08-25 HISTORY — DX: Hereditary hemolytic-uremic syndrome: D59.32

## 2017-08-25 MED ORDER — LIDOCAINE HCL 1 % IJ SOLN
0.50 | INTRAMUSCULAR | Status: DC
Start: ? — End: 2017-08-25

## 2017-08-25 MED ORDER — NIFEDIPINE ER OSMOTIC RELEASE 90 MG PO TB24
90.00 | ORAL_TABLET | ORAL | Status: DC
Start: 2017-08-25 — End: 2017-08-25

## 2017-08-25 MED ORDER — GLUCAGON HCL RDNA (DIAGNOSTIC) 1 MG IJ SOLR
1.00 | INTRAMUSCULAR | Status: DC
Start: ? — End: 2017-08-25

## 2017-08-25 MED ORDER — DOCUSATE SODIUM 100 MG PO CAPS
100.00 | ORAL_CAPSULE | ORAL | Status: DC
Start: ? — End: 2017-08-25

## 2017-08-25 MED ORDER — MELATONIN 3 MG PO TABS
3.00 | ORAL_TABLET | ORAL | Status: DC
Start: 2017-08-25 — End: 2017-08-25

## 2017-08-25 MED ORDER — GENERIC EXTERNAL MEDICATION
60.00 | Status: DC
Start: 2017-08-26 — End: 2017-08-25

## 2017-08-25 MED ORDER — POLYETHYLENE GLYCOL 3350 17 G PO PACK
17.00 | PACK | ORAL | Status: DC
Start: 2017-08-26 — End: 2017-08-25

## 2017-08-25 MED ORDER — MAGNESIUM HYDROXIDE 400 MG/5ML PO SUSP
15.00 | ORAL | Status: DC
Start: ? — End: 2017-08-25

## 2017-08-25 MED ORDER — PANTOPRAZOLE SODIUM 40 MG PO TBEC
40.00 | DELAYED_RELEASE_TABLET | ORAL | Status: DC
Start: 2017-08-26 — End: 2017-08-25

## 2017-08-25 MED ORDER — PNV PRENATAL PLUS MULTIVITAMIN 27-1 MG PO TABS
1.00 | ORAL_TABLET | ORAL | Status: DC
Start: 2017-08-26 — End: 2017-08-25

## 2017-08-25 MED ORDER — DEXTROSE 50 % IV SOLN
12.50 | INTRAVENOUS | Status: DC
Start: ? — End: 2017-08-25

## 2017-08-25 MED ORDER — ALBUMIN HUMAN 5 % IV SOLN
500.00 | INTRAVENOUS | Status: DC
Start: ? — End: 2017-08-25

## 2017-08-25 MED ORDER — AZITHROMYCIN 250 MG PO TABS
250.00 | ORAL_TABLET | ORAL | Status: DC
Start: 2017-08-26 — End: 2017-08-25

## 2017-08-25 MED ORDER — LABETALOL HCL 200 MG PO TABS
800.00 | ORAL_TABLET | ORAL | Status: DC
Start: 2017-08-25 — End: 2017-08-25

## 2017-08-25 MED ORDER — GENERIC EXTERNAL MEDICATION
1.00 | Status: DC
Start: ? — End: 2017-08-25

## 2017-08-25 MED ORDER — GENERIC EXTERNAL MEDICATION
Status: DC
Start: ? — End: 2017-08-25

## 2017-08-25 MED ORDER — HYDRALAZINE HCL 10 MG PO TABS
10.00 | ORAL_TABLET | ORAL | Status: DC
Start: ? — End: 2017-08-25

## 2017-08-25 MED ORDER — METHYLDOPA 250 MG PO TABS
250.00 | ORAL_TABLET | ORAL | Status: DC
Start: 2017-08-25 — End: 2017-08-25

## 2017-08-25 NOTE — Assessment & Plan Note (Deleted)
#  Hemolytic anemia- [elevated LDH ~350; low haptoglobin]; DAT negative x2/PNH negative/no evidence of extravascular hemolysis.    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  I recommend prednisone 60 mg a day times 2 weeks; and taper based upon hemoglobin response.  Recommend adding folic acid 1 mg a day.  #Discussed the multiple side effects-elevated blood sugars [my biggest concern]; recommend calling her endocrinologist prior to starting the steroids [patient has appointment on April 12].  Also discussed regarding fluid retention/elevated blood pressure/anxiety. However given the risks versus benefits-I think is reasonable to give a trial of prednisone.  # ? CKD/AKI-followed by Dr. Candiss Norse.  # IDDM [Dr.Kerr; [facility] physcian; ]-see discussion above.  I had spoke with Dr. Buddy Duty my plan for steroids previously.   # follow up in 3 weeks/ hold tube; weekly cbc/hold tube.   CC;Dr.Singh; Dr.Cherry; Dr.Kerr; Jacqulynn Cadet; Dr. Diamantina Monks

## 2017-08-25 NOTE — Progress Notes (Signed)
Sent discharge letter to referring provider, as patient is unable to complete the program at this time.

## 2017-08-25 NOTE — Progress Notes (Deleted)
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     HISTORY OF PRESENTING ILLNESS:  Kelly Huff 32 y.o.  female with long-standing history of diabetes insulin-dependent/chronic kidney disease/and also currently pregnant [starting 14th week April 12th]-is here for follow-up of her anemia.  Patient was diagnosed with hemolytic anemia at last visit.  I recommended starting prednisone-however after lengthy discussion patient declined starting medication given the concerns of side effects.  Patient had at least 2 blood transfusions in the last 1 month.  Her last blood transfusion was approximately a week ago.   Patient continues to complain of mild shortness of breath; overall energy levels improved.  Patient denies any unusual swelling of the legs.  No nausea no vomiting.   ROS: A complete 10 point review of system is done which is negative except mentioned above in history of present illness  MEDICAL HISTORY:  Past Medical History:  Diagnosis Date  . Chronic kidney disease   . Diabetes mellitus without complication ([place])   . History of pre-eclampsia 2016   mild  . Hypertension     SURGICAL HISTORY: Past Surgical History:  Procedure Laterality Date  . DILATION AND CURETTAGE OF UTERUS     x 4    SOCIAL HISTORY: Social History   Socioeconomic History  . Marital status: Single    Spouse name: Not on file  . Number of children: Not on file  . Years of education: Not on file  . Highest education level: Not on file  Occupational History  . Not on file  Social Needs  . Financial resource strain: Not on file  . Food insecurity:     Worry: Not on file    Inability: Not on file  . Transportation needs:    Medical: Not on file    Non-medical: Not on file  Tobacco Use  . Smoking status: Never Smoker  . Smokeless tobacco: Never Used  Substance and Sexual Activity  . Alcohol use: No  . Drug use: No  . Sexual activity: Yes    Birth control/protection: None  Lifestyle  . Physical activity:    Days per week: Not on file    Minutes per session: Not on file  . Stress: Not on file  Relationships  . Social connections:    Talks on phone: Not on file    Gets together: Not on file    Attends religious service: Not on file    Active member of club or organization: Not on file    Attends meetings of clubs or organizations: Not on file    Relationship status: Not on file  . Intimate partner violence:    Fear of current or ex partner: Not on file    Emotionally abused: Not on file    Physically abused: Not on file    Forced sexual activity: Not on file  Other Topics Concern  . Not on file  Social History Narrative  . Not on file    FAMILY HISTORY: Family History  Problem Relation Age of Onset  . Hypertension Mother   . Cancer Mother        Uterine vs cervical (pt  unsure),   . Schizophrenia Brother     ALLERGIES:  is allergic to morphine; sulfamethoxazole-trimethoprim; and trulicity [dulaglutide].  MEDICATIONS:  Current Outpatient Medications  Medication Sig Dispense Refill  . aspirin EC 81 MG tablet Take 1 tablet (81 mg total) by mouth daily. Take after 12 weeks for prevention of preeclampssia later in pregnancy 518 tablet 2  . folic acid (FOLVITE) 1 MG tablet Take 1 tablet (1 mg total) by mouth daily. 90 tablet 1  . labetalol (NORMODYNE) 300 MG tablet Take 1 tablet (300 mg total) by mouth 3 (three) times daily. 100 tablet 1  . metoCLOPramide (REGLAN) 10 MG tablet Take 1 tablet (10 mg total) by mouth 3 (three) times daily before meals. 90 tablet 3  . NIFEdipine (PROCARDIA-XL/ADALAT-CC/NIFEDICAL-XL) 30 MG 24  hr tablet Take 1 tablet (30 mg total) by mouth 2 (two) times daily. 60 tablet 6  . NOVOLOG FLEXPEN 100 UNIT/ML FlexPen 0-9 units injected 3 times daily with meals according to the following scale: CBG 70-120 = 0 units CBG 121-150 = 1 unit CBG 151-200 = 2 units CBG 201-250 = 3 units CBG 251-300 = 5 units CBG 301-350 = 7 units CBG 351-400 = 9 units CBG >400 = call MD for advice 15 mL 0  . predniSONE (DELTASONE) 20 MG tablet Take 3 pills once a day x 2 weeks; and will taper based on response to hemoglobin. 80 tablet 0  . Prenatal Vit-Fe Fumarate-FA (MULTIVITAMIN-PRENATAL) 27-0.8 MG TABS tablet Take 1 tablet by mouth daily at 12 noon.     No current facility-administered medications for this visit.       Marland Kitchen  PHYSICAL EXAMINATION: ECOG PERFORMANCE STATUS: 1 - Symptomatic but completely ambulatory  There were no vitals filed for this visit. There were no vitals filed for this visit.  GENERAL: Well-nourished well-developed; Alert, no distress and comfortable.   Alone.  EYES: positive for pallor; No icterus OROPHARYNX: no thrush or ulceration; good dentition  NECK: supple, no masses felt LYMPH:  no palpable lymphadenopathy in the cervical, axillary or inguinal regions LUNGS: clear to auscultation and  No wheeze or crackles HEART/CVS: regular rate & rhythm and no murmurs; No lower extremity edema ABDOMEN: abdomen soft, non-tender and normal bowel sounds Musculoskeletal:no cyanosis of digits and no clubbing  PSYCH: alert & oriented x 3 with fluent speech NEURO: no focal motor/sensory deficits SKIN:  no rashes or significant lesions  LABORATORY DATA:  I have reviewed the data as listed Lab Results  Component Value Date   WBC 18.9 (H) 08/16/2017   HGB 8.1 (L) 08/16/2017   HCT 23.5 (L) 08/16/2017   MCV 86.5 08/16/2017   PLT 128 (L) 08/16/2017   Recent Labs    07/03/17 1303 07/07/17 1210 08/05/17 1622 08/13/17 1726 08/15/17 0504 08/16/17 0448  NA 132* 135 131* 134* 134* 135   K 4.2 3.7 4.5 4.0 4.1 3.9  CL 105 106 100 110 112* 111  CO2 20* 19* 16* 17* 19* 19*  GLUCOSE 150* 46* 216* 133* 112* 59*  BUN 25* 24* 36* 53* 54* 56*  CREATININE 2.20* 2.01* 2.41* 2.10* 2.30* 2.17*  CALCIUM 8.3* 8.5* 8.7 8.2* 7.7* 8.0*  GFRNONAA 28* 31* 26* 30* 27* 29*  GFRAA 33* 36* 30* 35* 31* 33*  PROT 6.8  --  6.3 6.1*  --   --   ALBUMIN 2.9* 3.0* 3.2* 2.6*  --   --   AST 20  --  18 26  --   --  ALT 9*  --  10 19  --   --   ALKPHOS 92  --  121* 78  --   --   BILITOT 0.7  --  <0.2 0.4  --   --   BILIDIR <0.1*  --   --   --   --   --   IBILI NOT CALCULATED  --   --   --   --   --     RADIOGRAPHIC STUDIES: I have personally reviewed the radiological images as listed and agreed with the findings in the report. US Carotid Bilateral  Result Date: 08/16/2017 CLINICAL DATA:  Uncontrolled hypertension during pregnancy and diabetes. EXAM: BILATERAL CAROTID DUPLEX ULTRASOUND TECHNIQUE: Pearline Cables scale imaging, color Doppler and duplex ultrasound were performed of bilateral carotid and vertebral arteries in the neck. COMPARISON:  None. FINDINGS: Criteria: Quantification of carotid stenosis is based on velocity parameters that correlate the residual internal carotid diameter with NASCET-based stenosis levels, using the diameter of the distal internal carotid lumen as the denominator for stenosis measurement. The following velocity measurements were obtained: RIGHT ICA:  118/35 cm/sec CCA:  409/81 cm/sec SYSTOLIC ICA/CCA RATIO:  0.9 ECA:  177 cm/sec LEFT ICA:  108/33 cm/sec CCA:  191/47 cm/sec SYSTOLIC ICA/CCA RATIO:  0.8 ECA:  173 cm/sec RIGHT CAROTID ARTERY: No focal plaque identified. No evidence of carotid stenosis in the right neck. RIGHT VERTEBRAL ARTERY: Antegrade flow with normal waveform and velocity. LEFT CAROTID ARTERY: No focal plaque identified. No evidence of carotid stenosis in the left neck. LEFT VERTEBRAL ARTERY: Antegrade flow with normal waveform and velocity. IMPRESSION: Normal carotid  duplex ultrasound demonstrating no evidence of bilateral carotid plaque or stenosis. Electronically Signed   By: Aletta Edouard M.D.   On: 08/16/2017 20:39   [place] Mfm Ob Limited  Result Date: 08/19/2017 ----------------------------------------------------------------------  OBSTETRICS REPORT                      (Signed Final 08/19/2017 10:36 am) ---------------------------------------------------------------------- PATIENT INFO:  ID #:       [id number]                          D.O.B.:  1985-07-24 (31 yrs)  Name:       Kelly Huff                Visit Date: 08/19/2017 10:16 am ---------------------------------------------------------------------- PERFORMED BY:  Performed By:     Marco Collie           Ref. Address:     [address],                                                             [place], [place]  [address]  Referred By:      Rubie Maid MD ---------------------------------------------------------------------- SERVICE(S) PROVIDED:   [place] MFM OB LIMITED                                    [phone]  ---------------------------------------------------------------------- INDICATIONS:   [redacted] weeks gestation of pregnancy                Z3A.15   Type I diabetes   HTN   Hemolytic anemia   Chronic kidney disease   Hyperthyroidism   Elevated troponin  ---------------------------------------------------------------------- FETAL EVALUATION:  Num Of Fetuses:     1  Fetal Heart         160  Rate(bpm):  Cardiac Activity:   Present  Presentation:       Variable  Placenta:           Fundal Grade  Amniotic Fluid  AFI FV:      Within normal limits ---------------------------------------------------------------------- BIOMETRY:  BPD:        36  mm     G. Age:  17w 0d         92  %    CI:        74.68   %    70 - 86                                                          FL/HC:      17.7   %     13.3 - 16.5  HC:      132.2  mm     G. Age:  16w 6d         83  %                                                          FL/BPD:     65.0   %  FL:       23.4  mm     G. Age:  17w 0d         86  %  HUM:      22.7  mm     G. Age:  17w 0d         85  % ---------------------------------------------------------------------- GESTATIONAL AGE:  LMP:           15w 6d        Date:  04/30/17                 EDD:   02/04/18  U/S Today:     17w 0d                                        EDD:   01/27/18  Best:          15w 6d     Det. By:  Previous Ultrasound      EDD:   02/04/18                                      (  06/14/17) ---------------------------------------------------------------------- CERVIX UTERUS ADNEXA:  Cervix  Length:            3.5  cm. ---------------------------------------------------------------------- IMPRESSION:  Single intrauterine pregnancy with a best estimated  gestational age of [redacted] weeks 6 days.  Dating is based on  earliest available ultrasound (periods were described as  abnormal) performed at [facility] on 06/24/2017;  measurements were reported as 6 weeks 3 days.  Limited exam performed to assess biometry and evaluate  placenta given multiple medical issues that have arisen since  the onset of pregnancy.  Composite measurements are consistent with 17 weeks 0  days.  Placenta appears unremarkable.  No obvious  sonographic evidence of molar pregnancy.  Findings were discussed. ----------------------------------------------------------------------                  Wynona Neat, MD Electronically Signed Final Report   08/19/2017 10:36 am ----------------------------------------------------------------------   ASSESSMENT & PLAN:   No problem-specific Assessment & Plan notes found for this encounter.   All questions were answered. The patient knows to call the clinic with any problems, questions or concerns.    Cammie Sickle, MD 08/25/2017 8:20 AM

## 2017-08-26 MED ORDER — GENERIC EXTERNAL MEDICATION
Status: DC
Start: ? — End: 2017-08-26

## 2017-08-27 ENCOUNTER — Telehealth: Payer: Self-pay | Admitting: Obstetrics and Gynecology

## 2017-08-27 NOTE — Telephone Encounter (Signed)
I spoke with Ms. Kelly Huff and notified her that her appointments at [facility] on Thursday have been cancelled. She will be seen in [place] for all of her care moving forward due to her complex medical history.Her next visit in [place] is Tuesday 5/7.  Kelly Finlay, MS, CGC

## 2017-08-30 ENCOUNTER — Telehealth: Payer: Self-pay | Admitting: *Deleted

## 2017-08-30 ENCOUNTER — Other Ambulatory Visit: Payer: Self-pay | Admitting: *Deleted

## 2017-08-30 DIAGNOSIS — D593 Hemolytic-uremic syndrome, unspecified: Secondary | ICD-10-CM

## 2017-08-30 NOTE — Telephone Encounter (Signed)
Order letter from [employer] from [employer] and handed to Vickki Muff, RN

## 2017-08-30 NOTE — Telephone Encounter (Signed)
Patient called and states doctor at [facility] wants [place] to draw labs on her twice a week and fax results to them. She needs a return call to set up appts for this.  [phone]

## 2017-08-30 NOTE — Telephone Encounter (Addendum)
Hassan Rowan. We need more information on this. 1. Who is the ordering provider. 2. What labs do they want.  We need to have the ordering provider call Dr. Rogue Bussing directly to further discuss per Dr. Jacinto Reap

## 2017-08-30 NOTE — Telephone Encounter (Signed)
Orders received from [facility]. V/o obtained for weekly cbc labs from Dr. Bradd Canary. Patient needs cbc twice a week x 1 year for dx: D59.3 which needed to be drawn-starting today per orders from Dr. Mariam Dollar.  I contacted the patient. She declined lab visit today. Asked the the lab apt be scheduled tomorrow afternoon. She will go to [facility] for a f/u apt and would like to come after this apt for the lab draw.  Pt prefers to come on Tuesdays and Fridays.   Apts provided for 1 month's apt.

## 2017-08-31 ENCOUNTER — Inpatient Hospital Stay: Payer: Medicaid Other | Attending: Internal Medicine

## 2017-08-31 ENCOUNTER — Encounter: Payer: Self-pay | Admitting: Obstetrics and Gynecology

## 2017-09-02 ENCOUNTER — Ambulatory Visit: Payer: Medicaid Other

## 2017-09-02 ENCOUNTER — Other Ambulatory Visit: Payer: Medicaid Other

## 2017-09-03 ENCOUNTER — Inpatient Hospital Stay: Payer: Medicaid Other

## 2017-09-04 ENCOUNTER — Other Ambulatory Visit: Payer: Self-pay | Admitting: Obstetrics and Gynecology

## 2017-09-06 ENCOUNTER — Ambulatory Visit: Payer: Medicaid Other

## 2017-09-07 ENCOUNTER — Inpatient Hospital Stay: Payer: Medicaid Other

## 2017-09-10 ENCOUNTER — Inpatient Hospital Stay: Payer: Medicaid Other

## 2017-09-14 ENCOUNTER — Inpatient Hospital Stay: Payer: Medicaid Other

## 2017-09-17 ENCOUNTER — Telehealth: Payer: Self-pay | Admitting: *Deleted

## 2017-09-17 ENCOUNTER — Inpatient Hospital Stay: Payer: Medicaid Other

## 2017-09-17 NOTE — Telephone Encounter (Signed)
Appointments cnl. Patient is receiving care at [facility] [place]

## 2017-09-17 NOTE — Telephone Encounter (Signed)
-----   Message from Wilburn Cornelia sent at 09/17/2017 10:41 AM EDT ----- Regarding: cancel all labs for MAY Contact: [phone] FYI-pt called to cancel ALL labs in May-having labs drawn elsewhere. thx!

## 2017-09-21 ENCOUNTER — Inpatient Hospital Stay: Payer: Medicaid Other

## 2017-09-24 ENCOUNTER — Inpatient Hospital Stay: Payer: Medicaid Other

## 2017-10-05 ENCOUNTER — Encounter: Payer: Self-pay | Admitting: Obstetrics and Gynecology

## 2017-11-02 ENCOUNTER — Other Ambulatory Visit: Payer: Medicaid Other

## 2017-11-02 ENCOUNTER — Ambulatory Visit: Payer: Medicaid Other | Admitting: Internal Medicine

## 2017-11-09 DIAGNOSIS — E109 Type 1 diabetes mellitus without complications: Secondary | ICD-10-CM

## 2017-11-09 DIAGNOSIS — O119 Pre-existing hypertension with pre-eclampsia, unspecified trimester: Secondary | ICD-10-CM

## 2017-11-19 ENCOUNTER — Other Ambulatory Visit: Payer: Self-pay | Admitting: Internal Medicine

## 2017-12-30 ENCOUNTER — Telehealth: Payer: Self-pay | Admitting: *Deleted

## 2017-12-30 NOTE — Telephone Encounter (Signed)
Patient called and states that her physician Dr Mariam Dollar in [facility] wants her to start getting her [facility] infusions in [place]. Asking for this to be arranged. Please advise

## 2017-12-30 NOTE — Telephone Encounter (Signed)
Colette/Kelly Huff, Dr. B would need to see the patient in the office before adding any treatment options. If she desires to transfer her care to [facility] then she would still have to be established with them before MD could enter the treatment plans.

## 2017-12-30 NOTE — Telephone Encounter (Signed)
Dr. B will need to see her one day in the office next week to discuss tx options. She will not be getting treatment that day. Dr. B will need to put tx plan orders in and get this authorized. We will also need to coordinate the timing of tx's.  Colette, please schedule office visit next week.

## 2017-12-30 NOTE — Telephone Encounter (Signed)
She said in her message, Kelly Huff

## 2018-01-03 ENCOUNTER — Other Ambulatory Visit: Payer: Self-pay

## 2018-01-03 ENCOUNTER — Telehealth: Payer: Self-pay | Admitting: Internal Medicine

## 2018-01-03 ENCOUNTER — Encounter: Payer: Self-pay | Admitting: Internal Medicine

## 2018-01-03 ENCOUNTER — Inpatient Hospital Stay: Payer: Medicaid Other

## 2018-01-03 ENCOUNTER — Other Ambulatory Visit: Payer: Self-pay | Admitting: Internal Medicine

## 2018-01-03 ENCOUNTER — Inpatient Hospital Stay: Payer: Medicaid Other | Attending: Internal Medicine | Admitting: Internal Medicine

## 2018-01-03 DIAGNOSIS — D5939 Other hemolytic-uremic syndrome: Secondary | ICD-10-CM

## 2018-01-03 DIAGNOSIS — Z5112 Encounter for antineoplastic immunotherapy: Secondary | ICD-10-CM | POA: Diagnosis present

## 2018-01-03 DIAGNOSIS — Z794 Long term (current) use of insulin: Secondary | ICD-10-CM | POA: Diagnosis not present

## 2018-01-03 DIAGNOSIS — I129 Hypertensive chronic kidney disease with stage 1 through stage 4 chronic kidney disease, or unspecified chronic kidney disease: Secondary | ICD-10-CM | POA: Insufficient documentation

## 2018-01-03 DIAGNOSIS — D593 Hemolytic-uremic syndrome, unspecified: Secondary | ICD-10-CM | POA: Insufficient documentation

## 2018-01-03 DIAGNOSIS — E1122 Type 2 diabetes mellitus with diabetic chronic kidney disease: Secondary | ICD-10-CM

## 2018-01-03 DIAGNOSIS — N189 Chronic kidney disease, unspecified: Secondary | ICD-10-CM

## 2018-01-03 LAB — CBC WITH DIFFERENTIAL/PLATELET
Basophils Absolute: 0.1 10*3/uL (ref 0–0.1)
Basophils Relative: 1 %
Eosinophils Absolute: 0.2 10*3/uL (ref 0–0.7)
Eosinophils Relative: 4 %
HCT: 29.9 % — ABNORMAL LOW (ref 35.0–47.0)
Hemoglobin: 9.8 g/dL — ABNORMAL LOW (ref 12.0–16.0)
Lymphocytes Relative: 18 %
Lymphs Abs: 1 10*3/uL (ref 1.0–3.6)
MCH: 27.1 pg (ref 26.0–34.0)
MCHC: 32.8 g/dL (ref 32.0–36.0)
MCV: 82.7 fL (ref 80.0–100.0)
Monocytes Absolute: 0.3 10*3/uL (ref 0.2–0.9)
Monocytes Relative: 6 %
Neutro Abs: 3.8 10*3/uL (ref 1.4–6.5)
Neutrophils Relative %: 71 %
Platelets: 352 10*3/uL (ref 150–440)
RBC: 3.61 MIL/uL — ABNORMAL LOW (ref 3.80–5.20)
RDW: 16 % — ABNORMAL HIGH (ref 11.5–14.5)
WBC: 5.4 10*3/uL (ref 3.6–11.0)

## 2018-01-03 LAB — COMPREHENSIVE METABOLIC PANEL
ALT: 10 U/L (ref 0–44)
AST: 13 U/L — ABNORMAL LOW (ref 15–41)
Albumin: 3.1 g/dL — ABNORMAL LOW (ref 3.5–5.0)
Alkaline Phosphatase: 112 U/L (ref 38–126)
Anion gap: 8 (ref 5–15)
BUN: 34 mg/dL — ABNORMAL HIGH (ref 6–20)
CO2: 22 mmol/L (ref 22–32)
Calcium: 8.5 mg/dL — ABNORMAL LOW (ref 8.9–10.3)
Chloride: 105 mmol/L (ref 98–111)
Creatinine, Ser: 5.23 mg/dL — ABNORMAL HIGH (ref 0.44–1.00)
GFR calc Af Amer: 11 mL/min — ABNORMAL LOW (ref >60)
GFR calc non Af Amer: 10 mL/min — ABNORMAL LOW (ref >60)
Glucose, Bld: 207 mg/dL — ABNORMAL HIGH (ref 70–99)
Potassium: 4.8 mmol/L (ref 3.5–5.1)
Sodium: 135 mmol/L (ref 135–145)
Total Bilirubin: 0.5 mg/dL (ref 0.3–1.2)
Total Protein: 6.3 g/dL — ABNORMAL LOW (ref 6.5–8.1)

## 2018-01-03 LAB — LACTATE DEHYDROGENASE: LDH: 140 U/L (ref 98–192)

## 2018-01-03 NOTE — Assessment & Plan Note (Addendum)
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  With significant improvement of her hematologic parameters-normal hemoglobin/platelet count.  Renal function-no significant improvement [see discussion below].  I will reach out to Dr.Arepally re: pt's continuity of care.   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  However patient not needing dialysis.  Defer to nephrology regarding patient's questions regarding kidney transplantation.  #Patient awaiting infusion of ecluzimab every 2 weeks at [facility] on September 12; will plan next infusion with [place] on September 26  # ecluzimab infusions every 2 weeks; follow-up with me/labs in 2 months; labs today.

## 2018-01-03 NOTE — Progress Notes (Signed)
[place] NOTE  Patient Care Team: Lin Landsman, MD as PCP - General (Family Medicine)  CHIEF COMPLAINTS/PURPOSE OF CONSULTATION: Atypical HUS  # ATYPICAL HEMOLYTIC UREMIC SYNDROME [April2019-Dx; [facility]; Dr.Arepally; s/p Plex x1; April 26th- Ecluzimab 1200 mg q 2W  # AKI/CKD [dec 2018; Dr.Singh; Currently [facility]]; IDDM [since age of 52- Dr.Kerr,/Endo]    No history exists.     HISTORY OF PRESENTING ILLNESS:  Kelly Huff 32 y.o.  female with long-standing history of diabetes insulin-dependent/chronic kidney disease/and also recently diagnosed atypical hemolytic uremic syndrome-currently on Ecluzimab infusion is here for follow-up.  Patient's pregnancy was complicated by preeclampsia-admit to the hospital on November 09, 2016 at [facility].  Patient underwent emergent low transverse C-section on July 16.  Patient's creatinine peaked at 4.2 with a protein creatinine ratio of 10.5 g.  Patient's baby is currently in the NICU at [facility].  She is questing transfer of her ecluzimab infusions locally to [facility].  Appetite is good.  No weight loss.  No nausea no vomiting.  No swelling in the legs.  Review of Systems  Constitutional: Negative for chills, diaphoresis, fever, malaise/fatigue and weight loss.  HENT: Negative for nosebleeds and sore throat.   Eyes: Negative for double vision.  Respiratory: Negative for cough, hemoptysis, sputum production, shortness of breath and wheezing.   Cardiovascular: Negative for chest pain, palpitations, orthopnea and leg swelling.  Gastrointestinal: Negative for abdominal pain, blood in stool, constipation, diarrhea, heartburn, melena, nausea and vomiting.  Genitourinary: Negative for dysuria, frequency and urgency.  Musculoskeletal: Negative for back pain and joint pain.  Skin: Negative.  Negative for itching and rash.  Neurological: Negative for dizziness, tingling, focal weakness, weakness and headaches.  Endo/Heme/Allergies: Does not  bruise/bleed easily.  Psychiatric/Behavioral: Negative for depression. The patient is not nervous/anxious and does not have insomnia.     MEDICAL HISTORY:  Past Medical History:  Diagnosis Date  . Chronic kidney disease   . Diabetes mellitus without complication ([place])   . History of pre-eclampsia 2016   mild  . Hypertension     SURGICAL HISTORY: Past Surgical History:  Procedure Laterality Date  . DILATION AND CURETTAGE OF UTERUS     x 4    SOCIAL HISTORY: Social History   Socioeconomic History  . Marital status: Single    Spouse name: Not on file  . Number of children: Not on file  . Years of education: Not on file  . Highest education level: Not on file  Occupational History  . Not on file  Social Needs  . Financial resource strain: Not on file  . Food insecurity:    Worry: Not on file    Inability: Not on file  . Transportation needs:    Medical: Not on file    Non-medical: Not on file  Tobacco Use  . Smoking status: Never Smoker  . Smokeless tobacco: Never Used  Substance and Sexual Activity  . Alcohol use: No  . Drug use: No  . Sexual activity: Yes    Birth control/protection: None  Lifestyle  . Physical activity:    Days per week: Not on file    Minutes per session: Not on file  . Stress: Not on file  Relationships  . Social connections:    Talks on phone: Not on file    Gets together: Not on file    Attends religious service: Not on file    Active member of club or organization: Not on file    Attends  meetings of clubs or organizations: Not on file    Relationship status: Not on file  . Intimate partner violence:    Fear of current or ex partner: Not on file    Emotionally abused: Not on file    Physically abused: Not on file    Forced sexual activity: Not on file  Other Topics Concern  . Not on file  Social History Narrative  . Not on file    FAMILY HISTORY: Family History  Problem Relation Age of Onset  . Hypertension Mother   .  Cancer Mother        Uterine vs cervical (pt unsure),   . Schizophrenia Brother     ALLERGIES:  is allergic to morphine; sulfamethoxazole-trimethoprim; and trulicity [dulaglutide].  MEDICATIONS:  Current Outpatient Medications  Medication Sig Dispense Refill  . calcium acetate (PHOSLO) 667 MG capsule Take 1 capsule by mouth 3 (three) times daily.    Marland Kitchen labetalol (NORMODYNE) 300 MG tablet Take 1 tablet (300 mg total) by mouth 3 (three) times daily. 100 tablet 1  . NIFEdipine (PROCARDIA-XL/ADALAT-CC/NIFEDICAL-XL) 30 MG 24 hr tablet Take 1 tablet (30 mg total) by mouth 2 (two) times daily. 60 tablet 6  . NOVOLOG FLEXPEN 100 UNIT/ML FlexPen 0-9 units injected 3 times daily with meals according to the following scale: CBG 70-120 = 0 units CBG 121-150 = 1 unit CBG 151-200 = 2 units CBG 201-250 = 3 units CBG 251-300 = 5 units CBG 301-350 = 7 units CBG 351-400 = 9 units CBG >400 = call MD for advice 15 mL 0  . Prenatal Vit-Fe Fumarate-FA (MULTIVITAMIN-PRENATAL) 27-0.8 MG TABS tablet Take 1 tablet by mouth daily at 12 noon.    . cyclobenzaprine (FLEXERIL) 10 MG tablet TAKE 1 TABLET BY MOUTH 3 TIMES A DAY FOR 10 DAYS (Patient not taking: Reported on 01/03/2018) 30 tablet 3   No current facility-administered medications for this visit.       Marland Kitchen  PHYSICAL EXAMINATION: ECOG PERFORMANCE STATUS: 1 - Symptomatic but completely ambulatory  Vitals:   01/03/18 1002  BP: 132/85  Pulse: 83  Resp: 20  Temp: 97.6 F (36.4 C)   Filed Weights   01/03/18 1002  Weight: 196 lb (88.9 kg)    Physical Exam  Constitutional: She is oriented to person, place, and time and well-developed, well-nourished, and in no distress.  HENT:  Head: Normocephalic and atraumatic.  Mouth/Throat: Oropharynx is clear and moist. No oropharyngeal exudate.  Eyes: Pupils are equal, round, and reactive to light.  Neck: Normal range of motion. Neck supple.  Cardiovascular: Normal rate and regular rhythm.  Pulmonary/Chest:  No respiratory distress. She has no wheezes.  Abdominal: Soft. Bowel sounds are normal. She exhibits no distension and no mass. There is no tenderness. There is no rebound and no guarding.  Musculoskeletal: Normal range of motion. She exhibits no edema or tenderness.  Neurological: She is alert and oriented to person, place, and time.  Skin: Skin is warm.  Psychiatric: Affect normal.  ;  LABORATORY DATA:  I have reviewed the data as listed Lab Results  Component Value Date   WBC 5.4 01/03/2018   HGB 9.8 (L) 01/03/2018   HCT 29.9 (L) 01/03/2018   MCV 82.7 01/03/2018   PLT 352 01/03/2018   Recent Labs    07/03/17 1303  08/05/17 1622 08/13/17 1726 08/15/17 0504 08/16/17 0448 01/03/18 1240  NA 132*   < > 131* 134* 134* 135 135  K 4.2   < >  4.5 4.0 4.1 3.9 4.8  CL 105   < > 100 110 112* 111 105  CO2 20*   < > 16* 17* 19* 19* 22  GLUCOSE 150*   < > 216* 133* 112* 59* 207*  BUN 25*   < > 36* 53* 54* 56* 34*  CREATININE 2.20*   < > 2.41* 2.10* 2.30* 2.17* 5.23*  CALCIUM 8.3*   < > 8.7 8.2* 7.7* 8.0* 8.5*  GFRNONAA 28*   < > 26* 30* 27* 29* 10*  GFRAA 33*   < > 30* 35* 31* 33* 11*  PROT 6.8  --  6.3 6.1*  --   --  6.3*  ALBUMIN 2.9*   < > 3.2* 2.6*  --   --  3.1*  AST 20  --  18 26  --   --  13*  ALT 9*  --  10 19  --   --  10  ALKPHOS 92  --  121* 78  --   --  112  BILITOT 0.7  --  <0.2 0.4  --   --  0.5  BILIDIR <0.1*  --   --   --   --   --   --   IBILI NOT CALCULATED  --   --   --   --   --   --    < > = values in this interval not displayed.    RADIOGRAPHIC STUDIES: I have personally reviewed the radiological images as listed and agreed with the findings in the report. No results found.  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  With significant improvement of her hematologic parameters-normal hemoglobin/platelet count.  Renal  function-no significant improvement [see discussion below].  I will reach out to Dr.Arepally re: pt's continuity of care.   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  However patient not needing dialysis.  Defer to nephrology regarding patient's questions regarding kidney transplantation.  #Patient awaiting infusion of ecluzimab every 2 weeks at [facility] on September 12; will plan next infusion with [place] on September 26  # ecluzimab infusions every 2 weeks; follow-up with me/labs in 2 months; labs today.   All questions were answered. The patient knows to call the clinic with any problems, questions or concerns.    Cammie Sickle, MD 01/03/2018 1:28 PM

## 2018-01-03 NOTE — Progress Notes (Signed)
Matt-thank you; I was able to find soliris; the orders are in.  Patient received loading doses at [facility]; so she will continue the maintenance dosing. Pt will get bext dose on 9/12 at [facility]; and starting 9/26 will need infusion scheduled in [place].   # Patient would like/prefers to have infusions done in [place]. Thanks

## 2018-01-03 NOTE — Progress Notes (Signed)
ALERT: A disease instance has been permanently removed from this patient's pathway record and replaced with a new disease instance. Information on the new disease instance will be transmitted in a separate message.  Disease Being Removed: [Other Dx]  Reason for Removal: Reason not listed 

## 2018-01-03 NOTE — Progress Notes (Signed)
START OFF PATHWAY REGIMEN - [Other Dx]   [id number]:Eculizumab Maintenance every 14 days ([facility]; **2 cycles per order sheet**):   A cycle is every 14 days:     Eculizumab   **Always confirm dose/schedule in your pharmacy ordering system**  Patient Characteristics: Intent of Therapy: Non-Curative / Palliative Intent, Discussed with Patient

## 2018-01-03 NOTE — Telephone Encounter (Signed)
Heather/Brooke-once orders are in for eculizumab; can you please please inform authorization folks. thx

## 2018-01-04 NOTE — Progress Notes (Signed)
Called and spoke with [facility] infusion. Patient received: soliris 900mg  on 4/26, 5/3, 5/14 soliris 1200mg  on 5/30, 6/14, 6/27, 7/12 soliris 1500mg  on 7/25, 8/8, 8/23

## 2018-01-04 NOTE — Telephone Encounter (Signed)
msg sent to PA team to initiate PA

## 2018-01-05 NOTE — Telephone Encounter (Addendum)
No Josem Kaufmann is required for her infusion

## 2018-01-20 ENCOUNTER — Inpatient Hospital Stay: Payer: Medicaid Other

## 2018-01-20 VITALS — BP 131/82 | HR 88 | Temp 97.0°F | Resp 18 | Wt 196.0 lb

## 2018-01-20 DIAGNOSIS — D5939 Other hemolytic-uremic syndrome: Secondary | ICD-10-CM

## 2018-01-20 DIAGNOSIS — D593 Hemolytic-uremic syndrome: Secondary | ICD-10-CM

## 2018-01-20 DIAGNOSIS — Z5112 Encounter for antineoplastic immunotherapy: Secondary | ICD-10-CM | POA: Diagnosis not present

## 2018-01-20 MED ORDER — SODIUM CHLORIDE 0.9 % IV SOLN
Freq: Once | INTRAVENOUS | Status: AC
  Administered 2018-01-20: 10:00:00 via INTRAVENOUS
  Filled 2018-01-20: qty 250

## 2018-01-20 MED ORDER — SODIUM CHLORIDE 0.9 % IV SOLN
1200.0000 mg | Freq: Once | INTRAVENOUS | Status: AC
  Administered 2018-01-20: 1200 mg via INTRAVENOUS
  Filled 2018-01-20: qty 120

## 2018-02-01 ENCOUNTER — Ambulatory Visit (INDEPENDENT_AMBULATORY_CARE_PROVIDER_SITE_OTHER): Payer: Medicaid Other | Admitting: Obstetrics and Gynecology

## 2018-02-01 ENCOUNTER — Encounter: Payer: Self-pay | Admitting: Obstetrics and Gynecology

## 2018-02-01 VITALS — BP 169/110 | HR 97 | Ht 67.0 in | Wt 192.2 lb

## 2018-02-01 DIAGNOSIS — Z8759 Personal history of other complications of pregnancy, childbirth and the puerperium: Secondary | ICD-10-CM | POA: Diagnosis not present

## 2018-02-01 DIAGNOSIS — O99345 Other mental disorders complicating the puerperium: Secondary | ICD-10-CM | POA: Diagnosis not present

## 2018-02-01 DIAGNOSIS — D593 Hemolytic-uremic syndrome: Secondary | ICD-10-CM

## 2018-02-01 DIAGNOSIS — I10 Essential (primary) hypertension: Secondary | ICD-10-CM | POA: Diagnosis not present

## 2018-02-01 DIAGNOSIS — F53 Postpartum depression: Secondary | ICD-10-CM

## 2018-02-01 DIAGNOSIS — N183 Chronic kidney disease, stage 3 unspecified: Secondary | ICD-10-CM

## 2018-02-01 DIAGNOSIS — E1022 Type 1 diabetes mellitus with diabetic chronic kidney disease: Secondary | ICD-10-CM

## 2018-02-01 DIAGNOSIS — D5939 Other hemolytic-uremic syndrome: Secondary | ICD-10-CM

## 2018-02-01 NOTE — Progress Notes (Signed)
GYNECOLOGY PROGRESS NOTE  Subjective:    Patient ID: Kelly Huff, female    DOB: 1986-02-06, 32 y.o.   MRN: [id number]  HPI  Patient is a 32 y.o. [id number] female who presents for evaluation for postpartum depression.  She is currently approximately 3 months postpartum status post primary  cesarean section for cHTN with superimposed preeclampsia, chronic kidney disease, atypical hemolytic uremic syndrome, and type 1 diabetes.  Delivery occurred at approximately 27 gestational weeks.  Patient notes that she continues to feel episodes of sadness and tearfulness even though her preterm infant is now at home with her (previously thought her feelings were attributable to her baby having a long stay in the NICU).  She notes that she has a good support system, however sometimes has difficulty relying on them.  She notes that she lacks pleasure in doing things that she ordinarily used to do.  She goes on to state that sometimes she does not feel like taking care of herself or the baby or her other children.  She denies any thoughts of harming herself or anyone else.  EPDS score today is 21, increased from score of 18 at her postpartum visit.  At the time of her assessment during her postpartum visit patient declined medical therapy for depression.  The following portions of the patient's history were reviewed and updated as appropriate: allergies, current medications, past family history, past medical history, past social history, past surgical history and problem list.  Review of Systems Pertinent items noted in HPI and remainder of comprehensive ROS otherwise negative.   Objective:   Blood pressure (!) 169/110, pulse 97, height 5\' 7"  (1.702 m), weight 192 lb 3.2 oz (87.2 kg), last menstrual period 04/30/2017. General appearance: alert and no distress Abdomen: soft, non-tender; bowel sounds normal; no masses,  no organomegaly. Incision site well healed.  Pscyhological: Patient with normal  thought content, normal judgment, normal speech   Assessment:   Postpartum depression Chronic hypertension (uncontrolled) Chronic kidney disease Atypical hemolytic uremic syndrome Type 1 diabetes  Plan:   1.  Postpartum depression -lengthy discussion had with patient regarding her symptoms and postpartum depression.  I again discussed with her treatment options, including: oral antidepressant medications, with or without counseling, as well as the new FDA approved postpartum IV treatment Zulresso (offered at certain facilities).  Patient notes that she would like to think over her options with regards to medication treatments (as she is still very leary about oral antidepressants), but would like to begin initiate counseling.  Answered all questions to patient's satisfaction.  We will go ahead and place referral for counseling.  We will also check back with patient in 1 week to see if she is made a decision regarding initiation of medication.  Handouts given.  Will need to verify if patient is a candidate for the IV treatments due to her other medical history.  Also discussed that if symptoms worsen before next week to notify MD immediately or go to the emergency room for symptoms. 2. Chronic hypertension -with superimposed preeclampsia during recent pregnancy.  Patient notes that she is not really compliant with her blood pressure medications.  Notes that she last took her labetalol last week.  States that she last took her Procardia yesterday.  Strongly encourage patient to become more compliant with her medications as she is at increased risk for strokes and MI.  Patient notes that she has side effects when she takes the labetalol.  Discussed at least remaining compliant with  the Procardia, and to discuss with her PCP or her remaining on labetalol or if she can be changed to a different medication as she is not currently breast-feeding. 3.  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 4.  Type 1 diabetes -patient currently followed by endocrinology.  Is on insulin pump.    A total of 25 minutes were spent face-to-face with the patient during this encounter and over half of that time involved counseling and coordination of care.   Rubie Maid, MD [facility]

## 2018-02-01 NOTE — Patient Instructions (Addendum)
Perinatal Depression When a woman feels excessive sadness, anger, or anxiety during pregnancy or during the first 12 months after she gives birth, she has a condition called perinatal depression. Depression can interfere with work, school, relationships, and other everyday activities. If it is not managed properly, it can also cause problems in the mother and her baby. Sometimes, perinatal depression is left untreated because symptoms are thought to be normal mood swings during and right after pregnancy. If you have symptoms of depression, it is important to talk with your health care provider. What are the causes? The exact cause of this condition is not known. Hormonal changes during and after pregnancy may play a role in causing perinatal depression. What increases the risk? You are more likely to develop this condition if:  You have a personal or family history of depression, anxiety, or mood disorders.  You experience a stressful life event during pregnancy, such as the death of a loved one.  You have a lot of regular life stress.  You do not have support from family members or loved ones, or you are in an abusive relationship.  What are the signs or symptoms? Symptoms of this condition include:  Feeling sad or hopeless.  Feelings of guilt.  Feeling irritable or overwhelmed.  Changes in your appetite.  Lack of energy or motivation.  Sleep problems.  Difficulty concentrating or completing tasks.  Loss of interest in hobbies or relationships.  Headaches or stomach problems that do not go away.  How is this diagnosed? This condition is diagnosed based on a physical exam and mental evaluation. In some cases, your health care provider may use a depression screening tool. These tools include a list of questions that can help a health care provider diagnose depression. Your health care provider may refer you to a mental health expert who specializes in depression. How is this  treated? This condition may be treated with:  Medicines. Your health care provider will only give you medicines that have been proven safe for pregnancy and breastfeeding.  Talk therapy with a mental health professional to help change your patterns of thinking (cognitive behavioral therapy).  Support groups.  Brain stimulation or light therapies.  Stress reduction therapies, such as mindfulness.  Follow these instructions at home: Lifestyle  Do not use any products that contain nicotine or tobacco, such as cigarettes and e-cigarettes. If you need help quitting, ask your health care provider.  Do not use alcohol when you are pregnant. After your baby is born, limit alcohol intake to no more than 1 drink a day. One drink equals 12 oz of beer, 5 oz of wine, or 1 oz of hard liquor.  Consider joining a support group for new mothers. Ask your health care provider for recommendations.  Take good care of yourself. Make sure you: ? Get plenty of sleep. If you are having trouble sleeping, talk with your health care provider. ? Eat a healthy diet. This includes plenty of fruits and vegetables, whole grains, and lean proteins. ? Exercise regularly, as told by your health care provider. Ask your health care provider what exercises are safe for you. General instructions  Take over-the-counter and prescription medicines only as told by your health care provider.  Talk with your partner or family members about your feelings during pregnancy. Share any concerns or anxieties that you may have.  Ask for help with tasks or chores when you need it. Ask friends and family members to provide meals, watch your children,  or help with cleaning.  Keep all follow-up visits as told by your health care provider. This is important. Contact a health care provider if:  You (or people close to you) notice that you have any symptoms of depression.  You have depression and your symptoms get worse.  You  experience side effects from medicines, such as nausea or sleep problems. Get help right away if:  You feel like hurting yourself, your baby, or someone else. If you ever feel like you may hurt yourself or others, or have thoughts about taking your own life, get help right away. You can go to your nearest emergency department or call:  Your local emergency services (911 in the [place]  A suicide crisis helpline, such as the [place] at 5163151441. This is open 24 hours a day.  Summary  Perinatal depression is when a woman feels excessive sadness, anger, or anxiety during pregnancy or during the first 12 months after she gives birth.  If perinatal depression is not treated, it can lead to health problems for the mother and her baby.  This condition is treated with medicines, talk therapy, stress reduction therapies, or a combination of two or more treatments.  Talk with your partner or family members about your feelings. Do not be afraid to ask for help. This information is not intended to replace advice given to you by your health care provider. Make sure you discuss any questions you have with your health care provider. Document Released: 06/10/2016 Document Revised: 06/10/2016 Document Reviewed: 06/10/2016 Elsevier Interactive Patient Education  2018 [employer].   Sertraline tablets What is this medicine? SERTRALINE (SER tra leen) is used to treat depression. It may also be used to treat obsessive compulsive disorder, panic disorder, post-trauma stress, premenstrual dysphoric disorder (PMDD) or social anxiety. This medicine may be used for other purposes; ask your health care provider or pharmacist if you have questions. COMMON BRAND NAME(S): Zoloft What should I tell my health care provider before I take this medicine? They need to know if you have any of these conditions: -bleeding disorders -bipolar disorder or a family history of bipolar  disorder -glaucoma -heart disease -high blood pressure -history of irregular heartbeat -history of low levels of calcium, magnesium, or potassium in the blood -if you often drink alcohol -liver disease -receiving electroconvulsive therapy -seizures -suicidal thoughts, plans, or attempt; a previous suicide attempt by you or a family member -take medicines that treat or prevent blood clots -thyroid disease -an unusual or allergic reaction to sertraline, other medicines, foods, dyes, or preservatives -pregnant or trying to get pregnant -breast-feeding How should I use this medicine? Take this medicine by mouth with a glass of water. Follow the directions on the prescription label. You can take it with or without food. Take your medicine at regular intervals. Do not take your medicine more often than directed. Do not stop taking this medicine suddenly except upon the advice of your doctor. Stopping this medicine too quickly may cause serious side effects or your condition may worsen. A special MedGuide will be given to you by the pharmacist with each prescription and refill. Be sure to read this information carefully each time. Talk to your pediatrician regarding the use of this medicine in children. While this drug may be prescribed for children as young as 7 years for selected conditions, precautions do apply. Overdosage: If you think you have taken too much of this medicine contact a poison control center or emergency room at  once. NOTE: This medicine is only for you. Do not share this medicine with others. What if I miss a dose? If you miss a dose, take it as soon as you can. If it is almost time for your next dose, take only that dose. Do not take double or extra doses. What may interact with this medicine? Do not take this medicine with any of the following medications: -cisapride -dofetilide -dronedarone -linezolid -MAOIs like Carbex, Eldepryl, Marplan, Nardil, and  Parnate -methylene blue (injected into a vein) -pimozide -thioridazine This medicine may also interact with the following medications: -alcohol -amphetamines -aspirin and aspirin-like medicines -certain medicines for depression, anxiety, or psychotic disturbances -certain medicines for fungal infections like ketoconazole, fluconazole, posaconazole, and itraconazole -certain medicines for irregular heart beat like flecainide, quinidine, propafenone -certain medicines for migraine headaches like almotriptan, eletriptan, frovatriptan, naratriptan, rizatriptan, sumatriptan, zolmitriptan -certain medicines for sleep -certain medicines for seizures like carbamazepine, valproic acid, phenytoin -certain medicines that treat or prevent blood clots like warfarin, enoxaparin, dalteparin -cimetidine -digoxin -diuretics -fentanyl -isoniazid -lithium -NSAIDs, medicines for pain and inflammation, like ibuprofen or naproxen -other medicines that prolong the QT interval (cause an abnormal heart rhythm) -rasagiline -safinamide -supplements like St. John's wort, kava kava, valerian -tolbutamide -tramadol -tryptophan This list may not describe all possible interactions. Give your health care provider a list of all the medicines, herbs, non-prescription drugs, or dietary supplements you use. Also tell them if you smoke, drink alcohol, or use illegal drugs. Some items may interact with your medicine. What should I watch for while using this medicine? Tell your doctor if your symptoms do not get better or if they get worse. Visit your doctor or health care professional for regular checks on your progress. Because it may take several weeks to see the full effects of this medicine, it is important to continue your treatment as prescribed by your doctor. Patients and their families should watch out for new or worsening thoughts of suicide or depression. Also watch out for sudden changes in feelings such as  feeling anxious, agitated, panicky, irritable, hostile, aggressive, impulsive, severely restless, overly excited and hyperactive, or not being able to sleep. If this happens, especially at the beginning of treatment or after a change in dose, call your health care professional. Dennis Bast may get drowsy or dizzy. Do not drive, use machinery, or do anything that needs mental alertness until you know how this medicine affects you. Do not stand or sit up quickly, especially if you are an older patient. This reduces the risk of dizzy or fainting spells. Alcohol may interfere with the effect of this medicine. Avoid alcoholic drinks. Your mouth may get dry. Chewing sugarless gum or sucking hard candy, and drinking plenty of water may help. Contact your doctor if the problem does not go away or is severe. What side effects may I notice from receiving this medicine? Side effects that you should report to your doctor or health care professional as soon as possible: -allergic reactions like skin rash, itching or hives, swelling of the face, lips, or tongue -anxious -black, tarry stools -changes in vision -confusion -elevated mood, decreased need for sleep, racing thoughts, impulsive behavior -eye pain -fast, irregular heartbeat -feeling faint or lightheaded, falls -feeling agitated, angry, or irritable -hallucination, loss of contact with reality -loss of balance or coordination -loss of memory -painful or prolonged erections -restlessness, pacing, inability to keep still -seizures -stiff muscles -suicidal thoughts or other mood changes -trouble sleeping -unusual bleeding or bruising -unusually weak or  tired -vomiting Side effects that usually do not require medical attention (report to your doctor or health care professional if they continue or are bothersome): -change in appetite or weight -change in sex drive or performance -diarrhea -increased sweating -indigestion, nausea -tremors This list may  not describe all possible side effects. Call your doctor for medical advice about side effects. You may report side effects to FDA at 1-800-FDA-1088. Where should I keep my medicine? Keep out of the reach of children. Store at room temperature between 15 and 30 degrees C (59 and 86 degrees F). Throw away any unused medicine after the expiration date. NOTE: This sheet is a summary. It may not cover all possible information. If you have questions about this medicine, talk to your doctor, pharmacist, or health care provider.  2018 Elsevier/Gold Standard (2016-04-17 14:17:49)

## 2018-02-01 NOTE — Progress Notes (Signed)
Pt is present today due to postpartum depression. Pt had a EDPS= 21.

## 2018-02-02 ENCOUNTER — Encounter: Payer: Self-pay | Admitting: Obstetrics and Gynecology

## 2018-02-03 ENCOUNTER — Inpatient Hospital Stay: Payer: Medicaid Other | Attending: Internal Medicine

## 2018-02-03 VITALS — BP 166/117 | HR 89 | Temp 98.0°F

## 2018-02-03 DIAGNOSIS — D593 Hemolytic-uremic syndrome: Secondary | ICD-10-CM | POA: Insufficient documentation

## 2018-02-03 DIAGNOSIS — Z5112 Encounter for antineoplastic immunotherapy: Secondary | ICD-10-CM | POA: Diagnosis present

## 2018-02-03 DIAGNOSIS — D5939 Other hemolytic-uremic syndrome: Secondary | ICD-10-CM

## 2018-02-03 MED ORDER — SODIUM CHLORIDE 0.9 % IV SOLN
Freq: Once | INTRAVENOUS | Status: AC
  Administered 2018-02-03: 11:00:00 via INTRAVENOUS
  Filled 2018-02-03: qty 250

## 2018-02-03 MED ORDER — SODIUM CHLORIDE 0.9 % IV SOLN
1200.0000 mg | Freq: Once | INTRAVENOUS | Status: AC
  Administered 2018-02-03: 1200 mg via INTRAVENOUS
  Filled 2018-02-03: qty 120

## 2018-02-03 NOTE — Progress Notes (Signed)
Dr. Rogue Bussing is aware of high blood pressure today of 166/117. Ordered to proceed with treatment. Patient took prescribed labetalol medication. Will recheck VS before she leaves.

## 2018-02-17 ENCOUNTER — Inpatient Hospital Stay: Payer: Medicaid Other

## 2018-02-23 ENCOUNTER — Telehealth: Payer: Self-pay | Admitting: Internal Medicine

## 2018-02-23 ENCOUNTER — Inpatient Hospital Stay: Payer: Medicaid Other

## 2018-02-23 VITALS — BP 158/95 | HR 82 | Temp 96.3°F | Resp 18 | Wt 195.4 lb

## 2018-02-23 DIAGNOSIS — D593 Hemolytic-uremic syndrome: Secondary | ICD-10-CM

## 2018-02-23 DIAGNOSIS — Z5112 Encounter for antineoplastic immunotherapy: Secondary | ICD-10-CM | POA: Diagnosis not present

## 2018-02-23 DIAGNOSIS — D5939 Other hemolytic-uremic syndrome: Secondary | ICD-10-CM

## 2018-02-23 MED ORDER — SODIUM CHLORIDE 0.9 % IV SOLN
Freq: Once | INTRAVENOUS | Status: AC
  Administered 2018-02-23: 10:00:00 via INTRAVENOUS
  Filled 2018-02-23: qty 250

## 2018-02-23 MED ORDER — SODIUM CHLORIDE 0.9 % IV SOLN
1200.0000 mg | Freq: Once | INTRAVENOUS | Status: AC
  Administered 2018-02-23: 1200 mg via INTRAVENOUS
  Filled 2018-02-23: qty 120

## 2018-02-23 NOTE — Telephone Encounter (Signed)
Call regarding elevated blood pressures; question stressful situation.  Okay with infusion today.  Recommend checking blood pressures at home/bring the log of blood pressures at next visit in 1 week.

## 2018-02-23 NOTE — Progress Notes (Signed)
B/p 168/110 pt states she took home B/P medication "about 20 minutes ago" : per Dr. Rogue Bussing okay to proceed with treatment at this time, pt to keep a log of daily B/P and bring to next appt. Pt aware and verbalizes understanding.

## 2018-03-03 ENCOUNTER — Inpatient Hospital Stay: Payer: Medicaid Other

## 2018-03-03 ENCOUNTER — Other Ambulatory Visit: Payer: Self-pay | Admitting: Internal Medicine

## 2018-03-03 ENCOUNTER — Inpatient Hospital Stay: Payer: Medicaid Other | Admitting: Internal Medicine

## 2018-03-03 DIAGNOSIS — O99019 Anemia complicating pregnancy, unspecified trimester: Principal | ICD-10-CM

## 2018-03-03 DIAGNOSIS — D509 Iron deficiency anemia, unspecified: Secondary | ICD-10-CM

## 2018-03-03 NOTE — Progress Notes (Deleted)
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     HISTORY OF PRESENTING ILLNESS:  Kelly Huff 32 y.o.  female with long-standing history of diabetes insulin-dependent/chronic kidney disease/and also recently diagnosed atypical hemolytic uremic syndrome-currently on Ecluzimab infusion is here for follow-up.  Patient's pregnancy was complicated by preeclampsia-admit to the hospital on November 09, 2016 at [facility].  Patient underwent emergent low transverse C-section on July 16.  Patient's creatinine peaked at 4.2 with a protein creatinine ratio of 10.5 g.  Patient's baby is currently in the NICU at [facility].  She is questing transfer of her ecluzimab infusions locally to [facility].  Appetite is good.  No weight loss.  No nausea no vomiting.  No swelling in the legs.  Review of Systems  Constitutional: Negative for chills, diaphoresis, fever, malaise/fatigue and weight loss.  HENT: Negative for nosebleeds and sore throat.   Eyes: Negative for double vision.  Respiratory: Negative for cough, hemoptysis, sputum production, shortness of breath and wheezing.   Cardiovascular: Negative for chest pain, palpitations, orthopnea and leg swelling.  Gastrointestinal: Negative for abdominal pain, blood in stool, constipation, diarrhea, heartburn, melena, nausea and vomiting.  Genitourinary: Negative for dysuria, frequency and urgency.  Musculoskeletal: Negative for back pain and joint pain.  Skin: Negative.  Negative for itching and rash.  Neurological: Negative for dizziness, tingling, focal weakness, weakness and headaches.  Endo/Heme/Allergies: Does not  bruise/bleed easily.  Psychiatric/Behavioral: Negative for depression. The patient is not nervous/anxious and does not have insomnia.     MEDICAL HISTORY:  Past Medical History:  Diagnosis Date  . Chronic kidney disease   . Diabetes mellitus without complication ([place])   . History of pre-eclampsia 2016   mild  . Hypertension     SURGICAL HISTORY: Past Surgical History:  Procedure Laterality Date  . CESAREAN SECTION    . DILATION AND CURETTAGE OF UTERUS     x 4    SOCIAL HISTORY: Social History   Socioeconomic History  . Marital status: Single    Spouse name: Not on file  . Number of children: Not on file  . Years of education: Not on file  . Highest education level: Not on file  Occupational History  . Not on file  Social Needs  . Financial resource strain: Not on file  . Food insecurity:    Worry: Not on file    Inability: Not on file  . Transportation needs:    Medical: Not on file    Non-medical: Not on file  Tobacco Use  . Smoking status: Never Smoker  . Smokeless tobacco: Never Used  Substance and Sexual Activity  . Alcohol use: No  . Drug use: No  . Sexual activity: Yes    Birth control/protection: None, Surgical    Comment: tubial  Lifestyle  . Physical activity:    Days per week: Not on file    Minutes per session: Not on file  . Stress: Not on file  Relationships  . Social connections:    Talks on phone: Not on file    Gets together: Not on file    Attends religious service: Not on file    Active  member of club or organization: Not on file    Attends meetings of clubs or organizations: Not on file    Relationship status: Not on file  . Intimate partner violence:    Fear of current or ex partner: Not on file    Emotionally abused: Not on file    Physically abused: Not on file    Forced sexual activity: Not on file  Other Topics Concern  . Not on file  Social History Narrative  . Not on file    FAMILY HISTORY: Family History  Problem  Relation Age of Onset  . Hypertension Mother   . Cancer Mother        Uterine vs cervical (pt unsure),   . Schizophrenia Brother     ALLERGIES:  is allergic to morphine; sulfamethoxazole-trimethoprim; and trulicity [dulaglutide].  MEDICATIONS:  Current Outpatient Medications  Medication Sig Dispense Refill  . calcium acetate (PHOSLO) 667 MG capsule Take 1 capsule by mouth 3 (three) times daily.    . cyclobenzaprine (FLEXERIL) 10 MG tablet TAKE 1 TABLET BY MOUTH 3 TIMES A DAY FOR 10 DAYS 30 tablet 3  . labetalol (NORMODYNE) 300 MG tablet Take 1 tablet (300 mg total) by mouth 3 (three) times daily. 100 tablet 1  . NIFEdipine (PROCARDIA-XL/ADALAT-CC/NIFEDICAL-XL) 30 MG 24 hr tablet Take 1 tablet (30 mg total) by mouth 2 (two) times daily. 60 tablet 6  . NOVOLOG FLEXPEN 100 UNIT/ML FlexPen 0-9 units injected 3 times daily with meals according to the following scale: CBG 70-120 = 0 units CBG 121-150 = 1 unit CBG 151-200 = 2 units CBG 201-250 = 3 units CBG 251-300 = 5 units CBG 301-350 = 7 units CBG 351-400 = 9 units CBG >400 = call MD for advice 15 mL 0  . Prenatal Vit-Fe Fumarate-FA (MULTIVITAMIN-PRENATAL) 27-0.8 MG TABS tablet Take 1 tablet by mouth daily at 12 noon.     No current facility-administered medications for this visit.       Marland Kitchen  PHYSICAL EXAMINATION: ECOG PERFORMANCE STATUS: 1 - Symptomatic but completely ambulatory  There were no vitals filed for this visit. There were no vitals filed for this visit.  Physical Exam  Constitutional: She is oriented to person, place, and time and well-developed, well-nourished, and in no distress.  HENT:  Head: Normocephalic and atraumatic.  Mouth/Throat: Oropharynx is clear and moist. No oropharyngeal exudate.  Eyes: Pupils are equal, round, and reactive to light.  Neck: Normal range of motion. Neck supple.  Cardiovascular: Normal rate and regular rhythm.  Pulmonary/Chest: No respiratory distress. She has no wheezes.   Abdominal: Soft. Bowel sounds are normal. She exhibits no distension and no mass. There is no tenderness. There is no rebound and no guarding.  Musculoskeletal: Normal range of motion. She exhibits no edema or tenderness.  Neurological: She is alert and oriented to person, place, and time.  Skin: Skin is warm.  Psychiatric: Affect normal.  ;  LABORATORY DATA:  I have reviewed the data as listed Lab Results  Component Value Date   WBC 5.4 01/03/2018   HGB 9.8 (L) 01/03/2018   HCT 29.9 (L) 01/03/2018   MCV 82.7 01/03/2018   PLT 352 01/03/2018   Recent Labs    07/03/17 1303  08/05/17 1622 08/13/17 1726 08/15/17 0504 08/16/17 0448 01/03/18 1240  NA 132*   < > 131* 134* 134* 135 135  K 4.2   < > 4.5 4.0 4.1 3.9 4.8  CL 105   < > 100 110  112* 111 105  CO2 20*   < > 16* 17* 19* 19* 22  GLUCOSE 150*   < > 216* 133* 112* 59* 207*  BUN 25*   < > 36* 53* 54* 56* 34*  CREATININE 2.20*   < > 2.41* 2.10* 2.30* 2.17* 5.23*  CALCIUM 8.3*   < > 8.7 8.2* 7.7* 8.0* 8.5*  GFRNONAA 28*   < > 26* 30* 27* 29* 10*  GFRAA 33*   < > 30* 35* 31* 33* 11*  PROT 6.8  --  6.3 6.1*  --   --  6.3*  ALBUMIN 2.9*   < > 3.2* 2.6*  --   --  3.1*  AST 20  --  18 26  --   --  13*  ALT 9*  --  10 19  --   --  10  ALKPHOS 92  --  121* 78  --   --  112  BILITOT 0.7  --  <0.2 0.4  --   --  0.5  BILIDIR <0.1*  --   --   --   --   --   --   IBILI NOT CALCULATED  --   --   --   --   --   --    < > = values in this interval not displayed.    RADIOGRAPHIC STUDIES: I have personally reviewed the radiological images as listed and agreed with the findings in the report. No results found.  ASSESSMENT & PLAN:   No problem-specific Assessment & Plan notes found for this encounter.   All questions were answered. The patient knows to call the clinic with any problems, questions or concerns.    Cammie Sickle, MD 03/03/2018 8:21 AM

## 2018-03-03 NOTE — Assessment & Plan Note (Deleted)
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  With significant improvement of her hematologic parameters-normal hemoglobin/platelet count.  Renal function-no significant improvement [see discussion below].   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  However patient not needing dialysis.  Defer to nephrology regarding patient's questions regarding kidney transplantation.  # ecluzimab infusions every 2 weeks; follow-up with me/labs in 2 months; labs today.

## 2018-03-10 ENCOUNTER — Inpatient Hospital Stay: Payer: Medicaid Other | Attending: Internal Medicine

## 2018-03-10 ENCOUNTER — Other Ambulatory Visit: Payer: Self-pay

## 2018-03-10 ENCOUNTER — Inpatient Hospital Stay: Payer: Medicaid Other

## 2018-03-10 ENCOUNTER — Inpatient Hospital Stay (HOSPITAL_BASED_OUTPATIENT_CLINIC_OR_DEPARTMENT_OTHER): Payer: Medicaid Other | Admitting: Internal Medicine

## 2018-03-10 VITALS — BP 148/90 | HR 106 | Temp 97.8°F | Resp 20 | Ht 67.0 in | Wt 194.0 lb

## 2018-03-10 DIAGNOSIS — Z5112 Encounter for antineoplastic immunotherapy: Secondary | ICD-10-CM | POA: Diagnosis present

## 2018-03-10 DIAGNOSIS — D593 Hemolytic-uremic syndrome: Secondary | ICD-10-CM | POA: Diagnosis present

## 2018-03-10 DIAGNOSIS — D599 Acquired hemolytic anemia, unspecified: Secondary | ICD-10-CM

## 2018-03-10 DIAGNOSIS — N185 Chronic kidney disease, stage 5: Secondary | ICD-10-CM | POA: Insufficient documentation

## 2018-03-10 DIAGNOSIS — D5939 Other hemolytic-uremic syndrome: Secondary | ICD-10-CM

## 2018-03-10 DIAGNOSIS — I120 Hypertensive chronic kidney disease with stage 5 chronic kidney disease or end stage renal disease: Secondary | ICD-10-CM | POA: Insufficient documentation

## 2018-03-10 DIAGNOSIS — E1122 Type 2 diabetes mellitus with diabetic chronic kidney disease: Secondary | ICD-10-CM | POA: Diagnosis not present

## 2018-03-10 DIAGNOSIS — O99019 Anemia complicating pregnancy, unspecified trimester: Principal | ICD-10-CM

## 2018-03-10 DIAGNOSIS — D509 Iron deficiency anemia, unspecified: Secondary | ICD-10-CM

## 2018-03-10 LAB — CBC WITH DIFFERENTIAL/PLATELET
Abs Immature Granulocytes: 0.02 10*3/uL (ref 0.00–0.07)
Basophils Absolute: 0.1 10*3/uL (ref 0.0–0.1)
Basophils Relative: 1 %
Eosinophils Absolute: 0.2 10*3/uL (ref 0.0–0.5)
Eosinophils Relative: 3 %
HCT: 30.3 % — ABNORMAL LOW (ref 36.0–46.0)
Hemoglobin: 10 g/dL — ABNORMAL LOW (ref 12.0–15.0)
Immature Granulocytes: 0 %
Lymphocytes Relative: 23 %
Lymphs Abs: 1.3 10*3/uL (ref 0.7–4.0)
MCH: 26.6 pg (ref 26.0–34.0)
MCHC: 33 g/dL (ref 30.0–36.0)
MCV: 80.6 fL (ref 80.0–100.0)
Monocytes Absolute: 0.4 10*3/uL (ref 0.1–1.0)
Monocytes Relative: 7 %
Neutro Abs: 3.7 10*3/uL (ref 1.7–7.7)
Neutrophils Relative %: 66 %
Platelets: 331 10*3/uL (ref 150–400)
RBC: 3.76 MIL/uL — ABNORMAL LOW (ref 3.87–5.11)
RDW: 14.3 % (ref 11.5–15.5)
WBC: 5.6 10*3/uL (ref 4.0–10.5)
nRBC: 0 % (ref 0.0–0.2)

## 2018-03-10 LAB — COMPREHENSIVE METABOLIC PANEL
ALT: 9 U/L (ref 0–44)
AST: 12 U/L — ABNORMAL LOW (ref 15–41)
Albumin: 3.5 g/dL (ref 3.5–5.0)
Alkaline Phosphatase: 156 U/L — ABNORMAL HIGH (ref 38–126)
Anion gap: 9 (ref 5–15)
BUN: 59 mg/dL — ABNORMAL HIGH (ref 6–20)
CO2: 23 mmol/L (ref 22–32)
Calcium: 8.9 mg/dL (ref 8.9–10.3)
Chloride: 104 mmol/L (ref 98–111)
Creatinine, Ser: 7 mg/dL — ABNORMAL HIGH (ref 0.44–1.00)
GFR calc Af Amer: 8 mL/min — ABNORMAL LOW (ref >60)
GFR calc non Af Amer: 7 mL/min — ABNORMAL LOW (ref >60)
Glucose, Bld: 116 mg/dL — ABNORMAL HIGH (ref 70–99)
Potassium: 4.2 mmol/L (ref 3.5–5.1)
Sodium: 136 mmol/L (ref 135–145)
Total Bilirubin: 0.3 mg/dL (ref 0.3–1.2)
Total Protein: 7.3 g/dL (ref 6.5–8.1)

## 2018-03-10 LAB — SAMPLE TO BLOOD BANK

## 2018-03-10 MED ORDER — SODIUM CHLORIDE 0.9 % IV SOLN
1200.0000 mg | Freq: Once | INTRAVENOUS | Status: AC
  Administered 2018-03-10: 1200 mg via INTRAVENOUS
  Filled 2018-03-10: qty 120

## 2018-03-10 MED ORDER — SODIUM CHLORIDE 0.9 % IV SOLN
Freq: Once | INTRAVENOUS | Status: AC
  Administered 2018-03-10: 10:00:00 via INTRAVENOUS
  Filled 2018-03-10: qty 250

## 2018-03-10 NOTE — Progress Notes (Signed)
[place] NOTE  Patient Care Team: Lin Landsman, MD as PCP - General (Family Medicine) Bowen, Elsworth Soho, FNP as Consulting Physician (Family Medicine)  CHIEF COMPLAINTS/PURPOSE OF CONSULTATION: Atypical HUS  # ATYPICAL HEMOLYTIC UREMIC SYNDROME [April 2019-Dx; [facility]; Dr.Arepally;s/p Plex x1; April 26th- Ecluzimab 1200 mg q 2W  # AKI/CKD [dec 2018; Dr.Singh; Currently [facility]]; IDDM [since age of 57- Dr.Kerr,/Endo]  # WORK UP for HUS [[facility]] Phospholipase A2 Receptor AB/ S Phospholipase A2 Receptor IFA-NEGATIVE    No history exists.     HISTORY OF PRESENTING ILLNESS:  Kelly Huff 32 y.o.  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  Patient states that she has been recently been evaluated by nephrology at [facility] have been managing her hypertension/renal disease.  Patient is currently not on dialysis.  No nausea no vomiting no swelling the legs.  No headaches.  Review of Systems  Constitutional: Negative for chills, diaphoresis, fever, malaise/fatigue and weight loss.  HENT: Negative for nosebleeds and sore throat.   Eyes: Negative for double vision.  Respiratory: Negative for cough, hemoptysis, sputum production, shortness of breath and wheezing.   Cardiovascular: Negative for chest pain, palpitations, orthopnea and leg swelling.  Gastrointestinal: Negative for abdominal pain, blood in stool, constipation, diarrhea, heartburn, melena, nausea and vomiting.  Genitourinary: Negative for dysuria, frequency and urgency.  Musculoskeletal: Negative for back pain and joint pain.  Skin: Negative.  Negative for itching and rash.  Neurological: Negative for dizziness, tingling, focal weakness, weakness and headaches.  Endo/Heme/Allergies: Does not bruise/bleed easily.  Psychiatric/Behavioral: Negative for depression. The patient is not nervous/anxious and does not have insomnia.     MEDICAL HISTORY:   Past Medical History:  Diagnosis Date  . Chronic kidney disease   . Diabetes mellitus without complication ([place])   . History of pre-eclampsia 2016   mild  . Hypertension     SURGICAL HISTORY: Past Surgical History:  Procedure Laterality Date  . CESAREAN SECTION    . DILATION AND CURETTAGE OF UTERUS     x 4    SOCIAL HISTORY: Social History   Socioeconomic History  . Marital status: Single    Spouse name: Not on file  . Number of children: Not on file  . Years of education: Not on file  . Highest education level: Not on file  Occupational History  . Not on file  Social Needs  . Financial resource strain: Not on file  . Food insecurity:    Worry: Not on file    Inability: Not on file  . Transportation needs:    Medical: Not on file    Non-medical: Not on file  Tobacco Use  . Smoking status: Never Smoker  . Smokeless tobacco: Never Used  Substance and Sexual Activity  . Alcohol use: No  . Drug use: No  . Sexual activity: Yes    Birth control/protection: None, Surgical    Comment: tubial  Lifestyle  . Physical activity:    Days per week: Not on file    Minutes per session: Not on file  . Stress: Not on file  Relationships  . Social connections:    Talks on phone: Not on file    Gets together: Not on file    Attends religious service: Not on file    Active member of club or organization: Not on file    Attends meetings of clubs or organizations: Not on file    Relationship status: Not  on file  . Intimate partner violence:    Fear of current or ex partner: Not on file    Emotionally abused: Not on file    Physically abused: Not on file    Forced sexual activity: Not on file  Other Topics Concern  . Not on file  Social History Narrative  . Not on file    FAMILY HISTORY: Family History  Problem Relation Age of Onset  . Hypertension Mother   . Cancer Mother        Uterine vs cervical (pt unsure),   . Schizophrenia Brother     ALLERGIES:  is  allergic to morphine; sulfamethoxazole-trimethoprim; and trulicity [dulaglutide].  MEDICATIONS:  Current Outpatient Medications  Medication Sig Dispense Refill  . calcium acetate (PHOSLO) 667 MG capsule Take 1 capsule by mouth 3 (three) times daily.    . cyclobenzaprine (FLEXERIL) 10 MG tablet TAKE 1 TABLET BY MOUTH 3 TIMES A DAY FOR 10 DAYS 30 tablet 3  . metoprolol succinate (TOPROL-XL) 25 MG 24 hr tablet Take 25 mg by mouth daily.  11  . NIFEdipine (PROCARDIA-XL/ADALAT-CC/NIFEDICAL-XL) 30 MG 24 hr tablet Take 1 tablet (30 mg total) by mouth 2 (two) times daily. 60 tablet 6  . NOVOLOG FLEXPEN 100 UNIT/ML FlexPen 0-9 units injected 3 times daily with meals according to the following scale: CBG 70-120 = 0 units CBG 121-150 = 1 unit CBG 151-200 = 2 units CBG 201-250 = 3 units CBG 251-300 = 5 units CBG 301-350 = 7 units CBG 351-400 = 9 units CBG >400 = call MD for advice 15 mL 0  . Prenatal Vit-Fe Fumarate-FA (MULTIVITAMIN-PRENATAL) 27-0.8 MG TABS tablet Take 1 tablet by mouth daily at 12 noon.     No current facility-administered medications for this visit.    Facility-Administered Medications Ordered in Other Visits  Medication Dose Route Frequency Provider Last Rate Last Dose  . eculizumab (SOLIRIS) 1,200 mg in sodium chloride 0.9 % 120 mL infusion  1,200 mg Intravenous Once Charlaine Dalton R, MD          .  PHYSICAL EXAMINATION: ECOG PERFORMANCE STATUS: 1 - Symptomatic but completely ambulatory  Vitals:   03/10/18 0906  BP: (!) 148/90  Pulse: (!) 106  Resp: 20  Temp: 97.8 F (36.6 C)   Filed Weights   03/10/18 0906  Weight: 194 lb (88 kg)    Physical Exam  Constitutional: She is oriented to person, place, and time and well-developed, well-nourished, and in no distress.  Patient is alone.  HENT:  Head: Normocephalic and atraumatic.  Mouth/Throat: Oropharynx is clear and moist. No oropharyngeal exudate.  Eyes: Pupils are equal, round, and reactive to light.   Neck: Normal range of motion. Neck supple.  Cardiovascular: Normal rate and regular rhythm.  Pulmonary/Chest: No respiratory distress. She has no wheezes.  Abdominal: Soft. Bowel sounds are normal. She exhibits no distension and no mass. There is no tenderness. There is no rebound and no guarding.  Musculoskeletal: Normal range of motion. She exhibits no edema or tenderness.  Neurological: She is alert and oriented to person, place, and time.  Skin: Skin is warm.  Psychiatric: Affect normal.  ;  LABORATORY DATA:  I have reviewed the data as listed Lab Results  Component Value Date   WBC 5.6 03/10/2018   HGB 10.0 (L) 03/10/2018   HCT 30.3 (L) 03/10/2018   MCV 80.6 03/10/2018   PLT 331 03/10/2018   Recent Labs    07/03/17 1303  08/13/17 1726  08/16/17 0448 01/03/18 1240 03/10/18 0842  NA 132*   < > 134*   < > 135 135 136  K 4.2   < > 4.0   < > 3.9 4.8 4.2  CL 105   < > 110   < > 111 105 104  CO2 20*   < > 17*   < > 19* 22 23  GLUCOSE 150*   < > 133*   < > 59* 207* 116*  BUN 25*   < > 53*   < > 56* 34* 59*  CREATININE 2.20*   < > 2.10*   < > 2.17* 5.23* 7.00*  CALCIUM 8.3*   < > 8.2*   < > 8.0* 8.5* 8.9  GFRNONAA 28*   < > 30*   < > 29* 10* 7*  GFRAA 33*   < > 35*   < > 33* 11* 8*  PROT 6.8   < > 6.1*  --   --  6.3* 7.3  ALBUMIN 2.9*   < > 2.6*  --   --  3.1* 3.5  AST 20   < > 26  --   --  13* 12*  ALT 9*   < > 19  --   --  10 9  ALKPHOS 92   < > 78  --   --  112 156*  BILITOT 0.7   < > 0.4  --   --  0.5 0.3  BILIDIR <0.1*  --   --   --   --   --   --   IBILI NOT CALCULATED  --   --   --   --   --   --    < > = values in this interval not displayed.    RADIOGRAPHIC STUDIES: I have personally reviewed the radiological images as listed and agreed with the findings in the report. No results found.  ASSESSMENT & PLAN:   HUS (hemolytic uremic syndrome), atypical ([place]) # Atypical hemolytic uremic syndrome [diagnosed with [facility]]-on Ecluzimab infusions.  # Proceed with  eculizumab infusion today.  Hemoglobin 10.0.  LDH normal.  #Also recommend Procrit 40,000 units every 2 weeks per [facility]; however hold because of poorly controlled blood pressures.  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 Worsening- creatinine- 7.0/ GFR- 8.  Dec 4th- Nephology; dec 12th transplant eval.   #Poorly controlled blood pressures/managed by [facility] nephrology.  # DISPOSITION: # Infusion today. # Ecluzimab infusions/labs- cbc/bmp [11/28;12/12; 12/27 ] # Follow-up with MD /labs-cbc/bmp/infusion-01/10 -Dr.B   All questions were answered. The patient knows to call the clinic with any problems, questions or concerns.    Cammie Sickle, MD 03/10/2018 10:21 AM

## 2018-03-10 NOTE — Assessment & Plan Note (Addendum)
#   Atypical hemolytic uremic syndrome [diagnosed with [facility]]-on Ecluzimab infusions.  # Proceed with eculizumab infusion today.  Hemoglobin 10.0.  LDH normal.  #Also recommend Procrit 40,000 units every 2 weeks per [facility]; however hold because of poorly controlled blood pressures.  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 Worsening- creatinine- 7.0/ GFR- 8.  Dec 4th- Nephology; dec 12th transplant eval.   #Poorly controlled blood pressures/managed by [facility] nephrology.  # DISPOSITION: # Infusion today. # Ecluzimab infusions/labs- cbc/bmp [11/28;12/12; 12/27 ] # Follow-up with MD /labs-cbc/bmp/infusion-01/10 -Dr.B

## 2018-03-10 NOTE — Progress Notes (Signed)
Creatinine: 7.00. MD, Dr. Rogue Bussing, notified and already aware. Per MD order: proceed with scheduled treatment today.

## 2018-03-22 ENCOUNTER — Telehealth: Payer: Self-pay | Admitting: Obstetrics and Gynecology

## 2018-03-22 NOTE — Telephone Encounter (Signed)
The patient called upset stating that she has called multiple times in regards to her needing a post partum infusion for 60 minutes. The patient stated that she never heard anything back from anyone. The front staff has done sent the messages back and advised the patient that clinical staff will reach out to the patient as soon as they possibly can. No other information was disclosed. Please advise.

## 2018-03-22 NOTE — Telephone Encounter (Signed)
Pt was called and spoke about wanting to get post-partum infusion. Pt was informed that [facility] was working on getting her referral to [facility] for her infusions. Pt was also informed of AC concerns about the medication and her kidney issues. Pt stated that was going to get a kidney scan and diagnostic test next week.

## 2018-03-22 NOTE — Telephone Encounter (Signed)
The patient called extremely concerned not having heard anything from Dr Marcelline Mates or her nurse in regards to her 60 hr post partum infusion. The patient stated that she would like a call back with at least an update today if possible. I informed the patient that someone will reach out to her as soon As they possibly can and I also apologized to the patient for the delay. The patient was more at ease by the end of the conversation. No other information was disclosed. Please advise.   *The patient also wanted to know if a spouse could be in the room with her during this infusion as well.*

## 2018-03-23 ENCOUNTER — Other Ambulatory Visit: Payer: Self-pay

## 2018-03-23 ENCOUNTER — Inpatient Hospital Stay: Payer: Medicaid Other

## 2018-03-23 VITALS — BP 169/96 | HR 95 | Resp 18

## 2018-03-23 DIAGNOSIS — D5939 Other hemolytic-uremic syndrome: Secondary | ICD-10-CM

## 2018-03-23 DIAGNOSIS — Z5112 Encounter for antineoplastic immunotherapy: Secondary | ICD-10-CM | POA: Diagnosis not present

## 2018-03-23 DIAGNOSIS — D593 Hemolytic-uremic syndrome: Secondary | ICD-10-CM

## 2018-03-23 DIAGNOSIS — D599 Acquired hemolytic anemia, unspecified: Secondary | ICD-10-CM

## 2018-03-23 LAB — CBC WITH DIFFERENTIAL/PLATELET
Abs Immature Granulocytes: 0.01 10*3/uL (ref 0.00–0.07)
Basophils Absolute: 0 10*3/uL (ref 0.0–0.1)
Basophils Relative: 1 %
Eosinophils Absolute: 0.1 10*3/uL (ref 0.0–0.5)
Eosinophils Relative: 3 %
HCT: 25.9 % — ABNORMAL LOW (ref 36.0–46.0)
Hemoglobin: 8.3 g/dL — ABNORMAL LOW (ref 12.0–15.0)
Immature Granulocytes: 0 %
Lymphocytes Relative: 25 %
Lymphs Abs: 1.1 10*3/uL (ref 0.7–4.0)
MCH: 26 pg (ref 26.0–34.0)
MCHC: 32 g/dL (ref 30.0–36.0)
MCV: 81.2 fL (ref 80.0–100.0)
Monocytes Absolute: 0.3 10*3/uL (ref 0.1–1.0)
Monocytes Relative: 7 %
Neutro Abs: 2.7 10*3/uL (ref 1.7–7.7)
Neutrophils Relative %: 64 %
Platelets: 270 10*3/uL (ref 150–400)
RBC: 3.19 MIL/uL — ABNORMAL LOW (ref 3.87–5.11)
RDW: 14 % (ref 11.5–15.5)
WBC: 4.3 10*3/uL (ref 4.0–10.5)
nRBC: 0 % (ref 0.0–0.2)

## 2018-03-23 LAB — SAMPLE TO BLOOD BANK

## 2018-03-23 LAB — BASIC METABOLIC PANEL
Anion gap: 11 (ref 5–15)
BUN: 57 mg/dL — ABNORMAL HIGH (ref 6–20)
CO2: 20 mmol/L — ABNORMAL LOW (ref 22–32)
Calcium: 8 mg/dL — ABNORMAL LOW (ref 8.9–10.3)
Chloride: 106 mmol/L (ref 98–111)
Creatinine, Ser: 7.59 mg/dL — ABNORMAL HIGH (ref 0.44–1.00)
GFR calc Af Amer: 7 mL/min — ABNORMAL LOW (ref >60)
GFR calc non Af Amer: 6 mL/min — ABNORMAL LOW (ref >60)
Glucose, Bld: 94 mg/dL (ref 70–99)
Potassium: 3.8 mmol/L (ref 3.5–5.1)
Sodium: 137 mmol/L (ref 135–145)

## 2018-03-23 MED ORDER — SODIUM CHLORIDE 0.9 % IV SOLN
Freq: Once | INTRAVENOUS | Status: AC
  Administered 2018-03-23: 11:00:00 via INTRAVENOUS
  Filled 2018-03-23: qty 250

## 2018-03-23 MED ORDER — SODIUM CHLORIDE 0.9 % IV SOLN
1200.0000 mg | Freq: Once | INTRAVENOUS | Status: AC
  Administered 2018-03-23: 1200 mg via INTRAVENOUS
  Filled 2018-03-23: qty 120

## 2018-03-23 NOTE — Telephone Encounter (Signed)
Please see another phone encounter.  

## 2018-04-01 NOTE — Telephone Encounter (Signed)
AC responded to pt via mychart.

## 2018-04-06 ENCOUNTER — Inpatient Hospital Stay: Payer: Medicaid Other

## 2018-04-06 ENCOUNTER — Inpatient Hospital Stay: Payer: Medicaid Other | Attending: Internal Medicine

## 2018-04-06 VITALS — BP 129/75 | HR 97 | Temp 96.2°F | Resp 18 | Ht 67.0 in | Wt 192.0 lb

## 2018-04-06 DIAGNOSIS — N184 Chronic kidney disease, stage 4 (severe): Secondary | ICD-10-CM | POA: Diagnosis present

## 2018-04-06 DIAGNOSIS — Z5112 Encounter for antineoplastic immunotherapy: Secondary | ICD-10-CM | POA: Insufficient documentation

## 2018-04-06 DIAGNOSIS — D593 Hemolytic-uremic syndrome: Secondary | ICD-10-CM | POA: Insufficient documentation

## 2018-04-06 DIAGNOSIS — D5939 Other hemolytic-uremic syndrome: Secondary | ICD-10-CM

## 2018-04-06 DIAGNOSIS — D599 Acquired hemolytic anemia, unspecified: Secondary | ICD-10-CM

## 2018-04-06 LAB — SAMPLE TO BLOOD BANK

## 2018-04-06 LAB — BASIC METABOLIC PANEL
Anion gap: 11 (ref 5–15)
BUN: 66 mg/dL — ABNORMAL HIGH (ref 6–20)
CO2: 22 mmol/L (ref 22–32)
Calcium: 8.5 mg/dL — ABNORMAL LOW (ref 8.9–10.3)
Chloride: 104 mmol/L (ref 98–111)
Creatinine, Ser: 7.48 mg/dL — ABNORMAL HIGH (ref 0.44–1.00)
GFR calc Af Amer: 7 mL/min — ABNORMAL LOW (ref >60)
GFR calc non Af Amer: 6 mL/min — ABNORMAL LOW (ref >60)
Glucose, Bld: 165 mg/dL — ABNORMAL HIGH (ref 70–99)
Potassium: 4.4 mmol/L (ref 3.5–5.1)
Sodium: 137 mmol/L (ref 135–145)

## 2018-04-06 LAB — CBC WITH DIFFERENTIAL/PLATELET
Abs Immature Granulocytes: 0.02 10*3/uL (ref 0.00–0.07)
Basophils Absolute: 0 10*3/uL (ref 0.0–0.1)
Basophils Relative: 1 %
Eosinophils Absolute: 0.2 10*3/uL (ref 0.0–0.5)
Eosinophils Relative: 3 %
HCT: 25.4 % — ABNORMAL LOW (ref 36.0–46.0)
Hemoglobin: 8.3 g/dL — ABNORMAL LOW (ref 12.0–15.0)
Immature Granulocytes: 0 %
Lymphocytes Relative: 19 %
Lymphs Abs: 1.1 10*3/uL (ref 0.7–4.0)
MCH: 26.3 pg (ref 26.0–34.0)
MCHC: 32.7 g/dL (ref 30.0–36.0)
MCV: 80.4 fL (ref 80.0–100.0)
Monocytes Absolute: 0.4 10*3/uL (ref 0.1–1.0)
Monocytes Relative: 6 %
Neutro Abs: 3.9 10*3/uL (ref 1.7–7.7)
Neutrophils Relative %: 71 %
Platelets: 322 10*3/uL (ref 150–400)
RBC: 3.16 MIL/uL — ABNORMAL LOW (ref 3.87–5.11)
RDW: 14.6 % (ref 11.5–15.5)
WBC: 5.6 10*3/uL (ref 4.0–10.5)
nRBC: 0 % (ref 0.0–0.2)

## 2018-04-06 MED ORDER — SODIUM CHLORIDE 0.9 % IV SOLN
1200.0000 mg | Freq: Once | INTRAVENOUS | Status: AC
  Administered 2018-04-06: 1200 mg via INTRAVENOUS
  Filled 2018-04-06: qty 120

## 2018-04-06 MED ORDER — SODIUM CHLORIDE 0.9 % IV SOLN
Freq: Once | INTRAVENOUS | Status: AC
  Administered 2018-04-06: 10:00:00 via INTRAVENOUS
  Filled 2018-04-06: qty 250

## 2018-04-07 ENCOUNTER — Other Ambulatory Visit: Payer: Medicaid Other

## 2018-04-07 ENCOUNTER — Ambulatory Visit: Payer: Medicaid Other

## 2018-04-22 ENCOUNTER — Other Ambulatory Visit: Payer: Self-pay

## 2018-04-22 ENCOUNTER — Inpatient Hospital Stay: Payer: Medicaid Other

## 2018-04-22 VITALS — BP 127/84 | HR 90 | Temp 96.7°F | Resp 18 | Wt 190.0 lb

## 2018-04-22 DIAGNOSIS — D5939 Other hemolytic-uremic syndrome: Secondary | ICD-10-CM

## 2018-04-22 DIAGNOSIS — D509 Iron deficiency anemia, unspecified: Secondary | ICD-10-CM

## 2018-04-22 DIAGNOSIS — O99019 Anemia complicating pregnancy, unspecified trimester: Secondary | ICD-10-CM

## 2018-04-22 DIAGNOSIS — N183 Chronic kidney disease, stage 3 unspecified: Secondary | ICD-10-CM

## 2018-04-22 DIAGNOSIS — D593 Hemolytic-uremic syndrome: Secondary | ICD-10-CM

## 2018-04-22 DIAGNOSIS — Z5112 Encounter for antineoplastic immunotherapy: Secondary | ICD-10-CM | POA: Diagnosis not present

## 2018-04-22 DIAGNOSIS — D599 Acquired hemolytic anemia, unspecified: Secondary | ICD-10-CM

## 2018-04-22 LAB — CBC WITH DIFFERENTIAL/PLATELET
Abs Immature Granulocytes: 0.01 10*3/uL (ref 0.00–0.07)
Basophils Absolute: 0 10*3/uL (ref 0.0–0.1)
Basophils Relative: 1 %
Eosinophils Absolute: 0.3 10*3/uL (ref 0.0–0.5)
Eosinophils Relative: 5 %
HCT: 24.3 % — ABNORMAL LOW (ref 36.0–46.0)
Hemoglobin: 8.1 g/dL — ABNORMAL LOW (ref 12.0–15.0)
Immature Granulocytes: 0 %
Lymphocytes Relative: 21 %
Lymphs Abs: 1.1 10*3/uL (ref 0.7–4.0)
MCH: 26.9 pg (ref 26.0–34.0)
MCHC: 33.3 g/dL (ref 30.0–36.0)
MCV: 80.7 fL (ref 80.0–100.0)
Monocytes Absolute: 0.5 10*3/uL (ref 0.1–1.0)
Monocytes Relative: 9 %
Neutro Abs: 3.3 10*3/uL (ref 1.7–7.7)
Neutrophils Relative %: 64 %
Platelets: 310 10*3/uL (ref 150–400)
RBC: 3.01 MIL/uL — ABNORMAL LOW (ref 3.87–5.11)
RDW: 14.8 % (ref 11.5–15.5)
WBC: 5.1 10*3/uL (ref 4.0–10.5)
nRBC: 0 % (ref 0.0–0.2)

## 2018-04-22 LAB — BASIC METABOLIC PANEL
Anion gap: 11 (ref 5–15)
BUN: 51 mg/dL — ABNORMAL HIGH (ref 6–20)
CO2: 20 mmol/L — ABNORMAL LOW (ref 22–32)
Calcium: 8 mg/dL — ABNORMAL LOW (ref 8.9–10.3)
Chloride: 106 mmol/L (ref 98–111)
Creatinine, Ser: 8.23 mg/dL — ABNORMAL HIGH (ref 0.44–1.00)
GFR calc Af Amer: 7 mL/min — ABNORMAL LOW (ref >60)
GFR calc non Af Amer: 6 mL/min — ABNORMAL LOW (ref >60)
Glucose, Bld: 198 mg/dL — ABNORMAL HIGH (ref 70–99)
Potassium: 4.1 mmol/L (ref 3.5–5.1)
Sodium: 137 mmol/L (ref 135–145)

## 2018-04-22 LAB — SAMPLE TO BLOOD BANK

## 2018-04-22 MED ORDER — EPOETIN ALFA 40000 UNIT/ML IJ SOLN
40000.0000 [IU] | Freq: Once | INTRAMUSCULAR | Status: AC
  Administered 2018-04-22: 40000 [IU] via SUBCUTANEOUS
  Filled 2018-04-22: qty 1

## 2018-04-22 MED ORDER — SODIUM CHLORIDE 0.9 % IV SOLN
Freq: Once | INTRAVENOUS | Status: AC
  Administered 2018-04-22: 10:00:00 via INTRAVENOUS
  Filled 2018-04-22: qty 250

## 2018-04-22 MED ORDER — SODIUM CHLORIDE 0.9 % IV SOLN
1200.0000 mg | Freq: Once | INTRAVENOUS | Status: AC
  Administered 2018-04-22: 1200 mg via INTRAVENOUS
  Filled 2018-04-22: qty 120

## 2018-04-22 NOTE — Progress Notes (Signed)
Patient in clinic today for [place] treatment. Pt inquired if [facility] sent over procrit orders. hgb 8.1. BP stable - 127/84. Per Dr. Aletha Halim last note. [facility] had recommended 40,000 units; however Dr. B recommended holding the procrit due to hypertension. I spoke with Ander Purpura, NP, who also paged Dr. Rogue Bussing. Patient's care discussed with Dr. Jacinto Reap- proceed with procrit today. Will add procrit again in 2 weeks on patient's schedule. RN will fax patient's current labs to nephrology and [facility] transplant Team. Creatinine continues to be elevated. (8.23). patient made aware of kidney function levels and was encouraged to contact her nephrologist to further discuss abn creatinine levels. Patient very tearful and she expressed that she is "optimistic that she can avoid a dialysis shunt." "I'm hoping to have a transplant in the next few months, but we are still trying to find a donor." Emotional support provided to patient. Pt gave verbal understanding of the plan of care.

## 2018-04-29 ENCOUNTER — Encounter (HOSPITAL_COMMUNITY): Payer: Self-pay

## 2018-05-05 ENCOUNTER — Other Ambulatory Visit: Payer: Self-pay

## 2018-05-05 DIAGNOSIS — D509 Iron deficiency anemia, unspecified: Secondary | ICD-10-CM

## 2018-05-05 DIAGNOSIS — O99019 Anemia complicating pregnancy, unspecified trimester: Principal | ICD-10-CM

## 2018-05-06 ENCOUNTER — Inpatient Hospital Stay: Payer: Medicaid Other

## 2018-05-06 ENCOUNTER — Inpatient Hospital Stay: Payer: Medicaid Other | Attending: Internal Medicine

## 2018-05-06 ENCOUNTER — Inpatient Hospital Stay (HOSPITAL_BASED_OUTPATIENT_CLINIC_OR_DEPARTMENT_OTHER): Payer: Medicaid Other | Admitting: Internal Medicine

## 2018-05-06 ENCOUNTER — Encounter: Payer: Self-pay | Admitting: Internal Medicine

## 2018-05-06 VITALS — BP 168/96 | HR 89 | Temp 97.1°F | Resp 16 | Wt 190.2 lb

## 2018-05-06 DIAGNOSIS — O99019 Anemia complicating pregnancy, unspecified trimester: Secondary | ICD-10-CM

## 2018-05-06 DIAGNOSIS — D593 Hemolytic-uremic syndrome: Secondary | ICD-10-CM | POA: Diagnosis present

## 2018-05-06 DIAGNOSIS — D5939 Other hemolytic-uremic syndrome: Secondary | ICD-10-CM

## 2018-05-06 DIAGNOSIS — N185 Chronic kidney disease, stage 5: Secondary | ICD-10-CM | POA: Diagnosis not present

## 2018-05-06 DIAGNOSIS — D631 Anemia in chronic kidney disease: Secondary | ICD-10-CM | POA: Insufficient documentation

## 2018-05-06 DIAGNOSIS — I120 Hypertensive chronic kidney disease with stage 5 chronic kidney disease or end stage renal disease: Secondary | ICD-10-CM | POA: Diagnosis not present

## 2018-05-06 DIAGNOSIS — Z5112 Encounter for antineoplastic immunotherapy: Secondary | ICD-10-CM | POA: Diagnosis present

## 2018-05-06 DIAGNOSIS — D509 Iron deficiency anemia, unspecified: Secondary | ICD-10-CM

## 2018-05-06 LAB — CBC WITH DIFFERENTIAL/PLATELET
Abs Immature Granulocytes: 0.02 10*3/uL (ref 0.00–0.07)
Basophils Absolute: 0 10*3/uL (ref 0.0–0.1)
Basophils Relative: 1 %
Eosinophils Absolute: 0.2 10*3/uL (ref 0.0–0.5)
Eosinophils Relative: 3 %
HCT: 25.4 % — ABNORMAL LOW (ref 36.0–46.0)
Hemoglobin: 8.1 g/dL — ABNORMAL LOW (ref 12.0–15.0)
Immature Granulocytes: 0 %
Lymphocytes Relative: 17 %
Lymphs Abs: 0.9 10*3/uL (ref 0.7–4.0)
MCH: 26.4 pg (ref 26.0–34.0)
MCHC: 31.9 g/dL (ref 30.0–36.0)
MCV: 82.7 fL (ref 80.0–100.0)
Monocytes Absolute: 0.4 10*3/uL (ref 0.1–1.0)
Monocytes Relative: 8 %
Neutro Abs: 3.7 10*3/uL (ref 1.7–7.7)
Neutrophils Relative %: 71 %
Platelets: 271 10*3/uL (ref 150–400)
RBC: 3.07 MIL/uL — ABNORMAL LOW (ref 3.87–5.11)
RDW: 17 % — ABNORMAL HIGH (ref 11.5–15.5)
WBC: 5.2 10*3/uL (ref 4.0–10.5)
nRBC: 0 % (ref 0.0–0.2)

## 2018-05-06 LAB — BASIC METABOLIC PANEL
Anion gap: 9 (ref 5–15)
BUN: 62 mg/dL — ABNORMAL HIGH (ref 6–20)
CO2: 21 mmol/L — ABNORMAL LOW (ref 22–32)
Calcium: 7.6 mg/dL — ABNORMAL LOW (ref 8.9–10.3)
Chloride: 110 mmol/L (ref 98–111)
Creatinine, Ser: 8.95 mg/dL — ABNORMAL HIGH (ref 0.44–1.00)
GFR calc Af Amer: 6 mL/min — ABNORMAL LOW (ref >60)
GFR calc non Af Amer: 5 mL/min — ABNORMAL LOW (ref >60)
Glucose, Bld: 177 mg/dL — ABNORMAL HIGH (ref 70–99)
Potassium: 4.5 mmol/L (ref 3.5–5.1)
Sodium: 140 mmol/L (ref 135–145)

## 2018-05-06 LAB — SAMPLE TO BLOOD BANK

## 2018-05-06 MED ORDER — SODIUM CHLORIDE 0.9 % IV SOLN
1200.0000 mg | Freq: Once | INTRAVENOUS | Status: AC
  Administered 2018-05-06: 1200 mg via INTRAVENOUS
  Filled 2018-05-06: qty 120

## 2018-05-06 MED ORDER — SODIUM CHLORIDE 0.9 % IV SOLN
Freq: Once | INTRAVENOUS | Status: AC
  Administered 2018-05-06: 10:00:00 via INTRAVENOUS
  Filled 2018-05-06: qty 250

## 2018-05-06 NOTE — Addendum Note (Signed)
Addended by: Sandria Bales B on: 05/06/2018 04:27 PM   Modules accepted: Orders

## 2018-05-06 NOTE — Patient Instructions (Signed)
#  Please take your blood pressure medications as recommended on a regular basis.

## 2018-05-06 NOTE — Progress Notes (Signed)
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     HISTORY OF PRESENTING ILLNESS:  Kelly Huff 33 y.o.  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  In the interim patient has been evaluated at [facility] nephrology just 3 days ago.  Patient understands that her kidney function is declining steadily GFR around 6-7.  She is awaiting her vascular evaluation for fistula placement.  Nephrology office blood pressure there was 120s over 80s.   Patient today states that she forgot to take her blood pressure medication ~distracted by her children.   Denies any worsening swelling in the legs.  Denies any nausea vomiting.  Review of Systems  Constitutional: Positive for malaise/fatigue. Negative for chills, diaphoresis, fever and weight loss.  HENT: Negative for nosebleeds and sore throat.   Eyes: Negative for double vision.  Respiratory: Negative for cough, hemoptysis, sputum production, shortness of breath and wheezing.   Cardiovascular: Negative for chest pain, palpitations, orthopnea and leg swelling.  Gastrointestinal: Negative for abdominal pain, blood in stool, constipation, diarrhea, heartburn, melena, nausea and vomiting.  Genitourinary: Negative for dysuria, frequency and urgency.  Musculoskeletal:  Negative for back pain and joint pain.  Skin: Negative.  Negative for itching and rash.  Neurological: Negative for dizziness, tingling, focal weakness, weakness and headaches.  Endo/Heme/Allergies: Does not bruise/bleed easily.  Psychiatric/Behavioral: Negative for depression. The patient is not nervous/anxious and does not have insomnia.     MEDICAL HISTORY:  Past Medical History:  Diagnosis Date  . Chronic kidney disease   . Diabetes mellitus without complication ([place])   . History of pre-eclampsia 2016   mild  . Hypertension     SURGICAL HISTORY: Past Surgical History:  Procedure Laterality Date  . CESAREAN SECTION    . DILATION AND CURETTAGE OF UTERUS     x 4    SOCIAL HISTORY: Social History   Socioeconomic History  . Marital status: Single    Spouse name: Not on file  . Number of children: Not on file  . Years of education: Not on file  . Highest education level: Not on file  Occupational History  . Not on file  Social Needs  . Financial resource strain: Not on file  . Food insecurity:    Worry: Not on file    Inability: Not on file  . Transportation needs:    Medical: Not on file    Non-medical: Not on file  Tobacco Use  . Smoking status: Never Smoker  . Smokeless tobacco: Never Used  Substance and Sexual Activity  . Alcohol use: No  . Drug use: No  . Sexual activity: Yes    Birth control/protection: None, Surgical    Comment: tubial  Lifestyle  . Physical activity:    Days per week: Not on file    Minutes per session: Not on file  . Stress: Not on file  Relationships  .  Social connections:    Talks on phone: Not on file    Gets together: Not on file    Attends religious service: Not on file    Active member of club or organization: Not on file    Attends meetings of clubs or organizations: Not on file    Relationship status: Not on file  . Intimate partner violence:    Fear of current or ex partner: Not on file    Emotionally abused: Not on  file    Physically abused: Not on file    Forced sexual activity: Not on file  Other Topics Concern  . Not on file  Social History Narrative  . Not on file    FAMILY HISTORY: Family History  Problem Relation Age of Onset  . Hypertension Mother   . Cancer Mother        Uterine vs cervical (pt unsure),   . Schizophrenia Brother     ALLERGIES:  is allergic to morphine; sulfamethoxazole-trimethoprim; and trulicity [dulaglutide].  MEDICATIONS:  Current Outpatient Medications  Medication Sig Dispense Refill  . calcium acetate (PHOSLO) 667 MG capsule Take 1 capsule by mouth 3 (three) times daily.    . cyclobenzaprine (FLEXERIL) 10 MG tablet TAKE 1 TABLET BY MOUTH 3 TIMES A DAY FOR 10 DAYS 30 tablet 3  . metoprolol succinate (TOPROL-XL) 25 MG 24 hr tablet Take 25 mg by mouth daily.  11  . NIFEdipine (PROCARDIA-XL/ADALAT-CC/NIFEDICAL-XL) 30 MG 24 hr tablet Take 1 tablet (30 mg total) by mouth 2 (two) times daily. 60 tablet 6  . NOVOLOG FLEXPEN 100 UNIT/ML FlexPen 0-9 units injected 3 times daily with meals according to the following scale: CBG 70-120 = 0 units CBG 121-150 = 1 unit CBG 151-200 = 2 units CBG 201-250 = 3 units CBG 251-300 = 5 units CBG 301-350 = 7 units CBG 351-400 = 9 units CBG >400 = call MD for advice 15 mL 0   No current facility-administered medications for this visit.       Marland Kitchen  PHYSICAL EXAMINATION: ECOG PERFORMANCE STATUS: 1 - Symptomatic but completely ambulatory  Vitals:   05/06/18 0944  BP: (!) 168/96  Pulse: 89  Resp: 16  Temp: (!) 97.1 F (36.2 C)   Filed Weights   05/06/18 0944  Weight: 190 lb 3.2 oz (86.3 kg)    Physical Exam  Constitutional: She is oriented to person, place, and time and well-developed, well-nourished, and in no distress.  Patient is alone.  HENT:  Head: Normocephalic and atraumatic.  Mouth/Throat: Oropharynx is clear and moist. No oropharyngeal exudate.  Eyes: Pupils are equal, round, and reactive to light.  Neck:  Normal range of motion. Neck supple.  Cardiovascular: Normal rate and regular rhythm.  Pulmonary/Chest: No respiratory distress. She has no wheezes.  Abdominal: Soft. Bowel sounds are normal. She exhibits no distension and no mass. There is no abdominal tenderness. There is no rebound and no guarding.  Musculoskeletal: Normal range of motion.        General: No tenderness or edema.  Neurological: She is alert and oriented to person, place, and time.  Skin: Skin is warm.  Psychiatric: Affect normal.  ;  LABORATORY DATA:  I have reviewed the data as listed Lab Results  Component Value Date   WBC 5.2 05/06/2018   HGB 8.1 (L) 05/06/2018   HCT 25.4 (L) 05/06/2018   MCV 82.7 05/06/2018   PLT 271 05/06/2018   Recent Labs    07/03/17 1303  08/13/17  1726  01/03/18 1240 03/10/18 0842  04/06/18 0852 04/22/18 0842 05/06/18 0923  NA 132*   < > 134*   < > 135 136   < > 137 137 140  K 4.2   < > 4.0   < > 4.8 4.2   < > 4.4 4.1 4.5  CL 105   < > 110   < > 105 104   < > 104 106 110  CO2 20*   < > 17*   < > 22 23   < > 22 20* 21*  GLUCOSE 150*   < > 133*   < > 207* 116*   < > 165* 198* 177*  BUN 25*   < > 53*   < > 34* 59*   < > 66* 51* 62*  CREATININE 2.20*   < > 2.10*   < > 5.23* 7.00*   < > 7.48* 8.23* 8.95*  CALCIUM 8.3*   < > 8.2*   < > 8.5* 8.9   < > 8.5* 8.0* 7.6*  GFRNONAA 28*   < > 30*   < > 10* 7*   < > 6* 6* 5*  GFRAA 33*   < > 35*   < > 11* 8*   < > 7* 7* 6*  PROT 6.8   < > 6.1*  --  6.3* 7.3  --   --   --   --   ALBUMIN 2.9*   < > 2.6*  --  3.1* 3.5  --   --   --   --   AST 20   < > 26  --  13* 12*  --   --   --   --   ALT 9*   < > 19  --  10 9  --   --   --   --   ALKPHOS 92   < > 78  --  112 156*  --   --   --   --   BILITOT 0.7   < > 0.4  --  0.5 0.3  --   --   --   --   BILIDIR <0.1*  --   --   --   --   --   --   --   --   --   IBILI NOT CALCULATED  --   --   --   --   --   --   --   --   --    < > = values in this interval not displayed.    RADIOGRAPHIC STUDIES: I  have personally reviewed the radiological images as listed and agreed with the findings in the report. No results found.  ASSESSMENT & PLAN:   HUS (hemolytic uremic syndrome), atypical ([place]) # Atypical hemolytic uremic syndrome [diagnosed with [facility]]-on Ecluzimab infusions.  # Proceed with eculizumab infusion today.  Also awaiting reevaluation at [facility] end of the month ~regarding duration of eculizumab infusion.   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 Procrit 40,000 units every 2 weeks per [facility]; however hold because of poorly controlled blood pressures [see discussion below]  #Chronic kidney disease stage V-however not needing dialysis; being monitored closely by [facility] nephrology. Worsening- creatinine- 7.0/ GFR- 8.   #Poorly controlled blood pressures/managed by [facility] nephrology; patient noncompliant with her medications.  She did not take her medication this morning.  Patient is currently on Procardia metoprolol.  Awaiting vascular for fistula/ kidney  transplant eval.    # DISPOSITION: # Infusion today; HOLD procrit. # 1 week- procrit # in 2 weeks- labs-cbc/bmp/hold tube; ecluzimab/procrit # in 4 weeks-Follow-up with MD /labs-cbc/bmp/eculuzimab/procrit -Dr.B   All questions were answered. The patient knows to call the clinic with any problems, questions or concerns.    Cammie Sickle, MD 05/06/2018 1:20 PM

## 2018-05-06 NOTE — Assessment & Plan Note (Addendum)
#   Atypical hemolytic uremic syndrome [diagnosed with [facility]]-on Ecluzimab infusions.  # Proceed with eculizumab infusion today.  Also awaiting reevaluation at [facility] end of the month ~regarding duration of eculizumab infusion.   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 Procrit 40,000 units every 2 weeks per [facility]; however hold because of poorly controlled blood pressures [see discussion below]  #Chronic kidney disease stage V-however not needing dialysis; being monitored closely by [facility] nephrology. Worsening- creatinine- 7.0/ GFR- 8.   #Poorly controlled blood pressures/managed by [facility] nephrology; patient noncompliant with her medications.  She did not take her medication this morning.  Patient is currently on Procardia metoprolol.  Awaiting vascular for fistula/ kidney transplant eval.    # DISPOSITION: # Infusion today; HOLD procrit. # 1 week- procrit # in 2 weeks- labs-cbc/bmp/hold tube; ecluzimab/procrit # in 4 weeks-Follow-up with MD /labs-cbc/bmp/eculuzimab/procrit -Dr.B

## 2018-05-12 ENCOUNTER — Ambulatory Visit: Payer: Medicaid Other | Admitting: Obstetrics and Gynecology

## 2018-05-12 ENCOUNTER — Encounter: Payer: Self-pay | Admitting: Obstetrics and Gynecology

## 2018-05-12 VITALS — BP 142/86 | HR 89 | Ht 67.0 in | Wt 188.9 lb

## 2018-05-12 NOTE — Progress Notes (Unsigned)
Pt is present today to desires a mammogram.

## 2018-05-12 NOTE — Progress Notes (Unsigned)
Cancelled visit. Menstrual cycle began today.  Needs pap smear and mammogram for preparation to be on kidney transplant list (family h/o BRCA gene positive in mother). To reschedule in 2 weeks.

## 2018-05-13 ENCOUNTER — Inpatient Hospital Stay: Payer: Medicaid Other

## 2018-05-20 ENCOUNTER — Inpatient Hospital Stay: Payer: Medicaid Other

## 2018-05-20 ENCOUNTER — Encounter: Payer: Self-pay | Admitting: Internal Medicine

## 2018-05-20 ENCOUNTER — Inpatient Hospital Stay (HOSPITAL_BASED_OUTPATIENT_CLINIC_OR_DEPARTMENT_OTHER): Payer: Medicaid Other | Admitting: Internal Medicine

## 2018-05-20 VITALS — BP 156/88 | HR 92 | Temp 96.6°F | Resp 16 | Wt 186.6 lb

## 2018-05-20 DIAGNOSIS — D5939 Other hemolytic-uremic syndrome: Secondary | ICD-10-CM

## 2018-05-20 DIAGNOSIS — D593 Hemolytic-uremic syndrome: Secondary | ICD-10-CM

## 2018-05-20 DIAGNOSIS — N185 Chronic kidney disease, stage 5: Secondary | ICD-10-CM

## 2018-05-20 DIAGNOSIS — D631 Anemia in chronic kidney disease: Secondary | ICD-10-CM | POA: Diagnosis not present

## 2018-05-20 DIAGNOSIS — N183 Chronic kidney disease, stage 3 unspecified: Secondary | ICD-10-CM

## 2018-05-20 DIAGNOSIS — I120 Hypertensive chronic kidney disease with stage 5 chronic kidney disease or end stage renal disease: Secondary | ICD-10-CM | POA: Diagnosis not present

## 2018-05-20 DIAGNOSIS — Z5112 Encounter for antineoplastic immunotherapy: Secondary | ICD-10-CM | POA: Diagnosis not present

## 2018-05-20 DIAGNOSIS — D599 Acquired hemolytic anemia, unspecified: Secondary | ICD-10-CM

## 2018-05-20 LAB — CBC WITH DIFFERENTIAL/PLATELET
Abs Immature Granulocytes: 0.02 10*3/uL (ref 0.00–0.07)
Basophils Absolute: 0 10*3/uL (ref 0.0–0.1)
Basophils Relative: 1 %
Eosinophils Absolute: 0.2 10*3/uL (ref 0.0–0.5)
Eosinophils Relative: 3 %
HCT: 28.2 % — ABNORMAL LOW (ref 36.0–46.0)
Hemoglobin: 9.1 g/dL — ABNORMAL LOW (ref 12.0–15.0)
Immature Granulocytes: 0 %
Lymphocytes Relative: 20 %
Lymphs Abs: 1.2 10*3/uL (ref 0.7–4.0)
MCH: 26 pg (ref 26.0–34.0)
MCHC: 32.3 g/dL (ref 30.0–36.0)
MCV: 80.6 fL (ref 80.0–100.0)
Monocytes Absolute: 0.3 10*3/uL (ref 0.1–1.0)
Monocytes Relative: 6 %
Neutro Abs: 4.1 10*3/uL (ref 1.7–7.7)
Neutrophils Relative %: 70 %
Platelets: 347 10*3/uL (ref 150–400)
RBC: 3.5 MIL/uL — ABNORMAL LOW (ref 3.87–5.11)
RDW: 16 % — ABNORMAL HIGH (ref 11.5–15.5)
WBC: 5.8 10*3/uL (ref 4.0–10.5)
nRBC: 0 % (ref 0.0–0.2)

## 2018-05-20 LAB — COMPREHENSIVE METABOLIC PANEL
ALT: 10 U/L (ref 0–44)
AST: 12 U/L — ABNORMAL LOW (ref 15–41)
Albumin: 4.1 g/dL (ref 3.5–5.0)
Alkaline Phosphatase: 179 U/L — ABNORMAL HIGH (ref 38–126)
Anion gap: 13 (ref 5–15)
BUN: 68 mg/dL — ABNORMAL HIGH (ref 6–20)
CO2: 19 mmol/L — ABNORMAL LOW (ref 22–32)
Calcium: 8.2 mg/dL — ABNORMAL LOW (ref 8.9–10.3)
Chloride: 105 mmol/L (ref 98–111)
Creatinine, Ser: 9.6 mg/dL — ABNORMAL HIGH (ref 0.44–1.00)
GFR calc Af Amer: 6 mL/min — ABNORMAL LOW (ref >60)
GFR calc non Af Amer: 5 mL/min — ABNORMAL LOW (ref >60)
Glucose, Bld: 266 mg/dL — ABNORMAL HIGH (ref 70–99)
Potassium: 4.1 mmol/L (ref 3.5–5.1)
Sodium: 137 mmol/L (ref 135–145)
Total Bilirubin: 0.6 mg/dL (ref 0.3–1.2)
Total Protein: 8 g/dL (ref 6.5–8.1)

## 2018-05-20 LAB — SAMPLE TO BLOOD BANK

## 2018-05-20 MED ORDER — SODIUM CHLORIDE 0.9 % IV SOLN
1200.0000 mg | Freq: Once | INTRAVENOUS | Status: AC
  Administered 2018-05-20: 1200 mg via INTRAVENOUS
  Filled 2018-05-20: qty 120

## 2018-05-20 MED ORDER — SODIUM CHLORIDE 0.9 % IV SOLN
Freq: Once | INTRAVENOUS | Status: AC
  Administered 2018-05-20: 10:00:00 via INTRAVENOUS
  Filled 2018-05-20: qty 250

## 2018-05-20 MED ORDER — EPOETIN ALFA 40000 UNIT/ML IJ SOLN
40000.0000 [IU] | Freq: Once | INTRAMUSCULAR | Status: AC
  Administered 2018-05-20: 40000 [IU] via SUBCUTANEOUS
  Filled 2018-05-20: qty 1

## 2018-05-20 NOTE — Assessment & Plan Note (Addendum)
#   Atypical hemolytic uremic syndrome [diagnosed with [facility]]-on Ecluzimab infusions.stable  # Proceed with eculizumab infusion today.  Also awaiting reevaluation at [facility] on Jan 27th ~regarding duration of eculizumab infusion.   #Anemia-hemoglobin 9.1;  predominant CKD/has no active hemolysis noted. Procrit 40,000 units every 2 weeks.  Stable  #Chronic kidney disease stage V-however not needing dialysis; being monitored closely by [facility] nephrology.  Worsening creatinine- 9.6. s/p vascular; awaiting fistula in feb.   #P hypertension managed by [facility] nephrology-stable continue on Procardia metoprolol.  # DISPOSITION: # Infusion today; Procrit today.  # in 2 weeks- labs-cbc/bmp/ ecluzimab/procrit # in 4 weeks-Follow-up with MD /labs-cbc/bmp/LDHeculuzimab/procrit -Dr.B  Cc; Nephrology/Dr.Arepally.

## 2018-05-20 NOTE — Progress Notes (Signed)
[place] NOTE  Patient Care Team: Rubie Maid, MD as PCP - General (Obstetrics and Gynecology) [name], FNP as Consulting Physician (Family Medicine) Azzie Glatter, MD as Consulting Physician (Internal Medicine) Sherryll Burger, MD as Consulting Physician (Nephrology)  CHIEF COMPLAINTS/PURPOSE OF CONSULTATION: Atypical HUS  # ATYPICAL HEMOLYTIC UREMIC SYNDROME [April 2019-Dx; [facility]; Dr.Arepally;s/p Plex x1; April 26th- Ecluzimab 1200 mg q 2W  # AKI/CKD [dec 2018; Dr.Singh; Currently [facility]]; IDDM [since age of 34- Dr.Kerr,/Endo]  # WORK UP for HUS [[facility]] Phospholipase A2 Receptor AB/ S Phospholipase A2 Receptor IFA-NEGATIVE    No history exists.     HISTORY OF PRESENTING ILLNESS:  Kelly Huff 33 y.o.  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  Patient continues to be off dialysis.  She states to be compliant with her blood pressure medications.  Denies any swelling in the legs.  Denies any nausea vomiting or headaches.  Chronic mild fatigue.  Review of Systems  Constitutional: Positive for malaise/fatigue. Negative for chills, diaphoresis, fever and weight loss.  HENT: Negative for nosebleeds and sore throat.   Eyes: Negative for double vision.  Respiratory: Negative for cough, hemoptysis, sputum production, shortness of breath and wheezing.   Cardiovascular: Negative for chest pain, palpitations, orthopnea and leg swelling.  Gastrointestinal: Negative for abdominal pain, blood in stool, constipation, diarrhea, heartburn, melena, nausea and vomiting.  Genitourinary: Negative for dysuria, frequency and urgency.  Musculoskeletal: Negative for back pain and joint pain.  Skin: Negative.  Negative for itching and rash.  Neurological: Negative for dizziness, tingling, focal weakness, weakness and headaches.  Endo/Heme/Allergies: Does not bruise/bleed easily.   Psychiatric/Behavioral: Negative for depression. The patient is not nervous/anxious and does not have insomnia.     MEDICAL HISTORY:  Past Medical History:  Diagnosis Date  . Chronic kidney disease   . Diabetes mellitus without complication ([place])   . History of pre-eclampsia 2016   mild  . Hypertension     SURGICAL HISTORY: Past Surgical History:  Procedure Laterality Date  . CESAREAN SECTION    . DILATION AND CURETTAGE OF UTERUS     x 4    SOCIAL HISTORY: Social History   Socioeconomic History  . Marital status: Single    Spouse name: Not on file  . Number of children: Not on file  . Years of education: Not on file  . Highest education level: Not on file  Occupational History  . Not on file  Social Needs  . Financial resource strain: Not on file  . Food insecurity:    Worry: Not on file    Inability: Not on file  . Transportation needs:    Medical: Not on file    Non-medical: Not on file  Tobacco Use  . Smoking status: Never Smoker  . Smokeless tobacco: Never Used  Substance and Sexual Activity  . Alcohol use: No  . Drug use: No  . Sexual activity: Yes    Birth control/protection: None, Surgical    Comment: tubial  Lifestyle  . Physical activity:    Days per week: Not on file    Minutes per session: Not on file  . Stress: Not on file  Relationships  . Social connections:    Talks on phone: Not on file    Gets together: Not on file    Attends religious service: Not on file    Active member of club or organization: Not on file  Attends meetings of clubs or organizations: Not on file    Relationship status: Not on file  . Intimate partner violence:    Fear of current or ex partner: Not on file    Emotionally abused: Not on file    Physically abused: Not on file    Forced sexual activity: Not on file  Other Topics Concern  . Not on file  Social History Narrative  . Not on file    FAMILY HISTORY: Family History  Problem Relation Age of Onset   . Hypertension Mother   . Cancer Mother        Uterine vs cervical (pt unsure),   . Schizophrenia Brother   . Breast cancer Neg Hx     ALLERGIES:  is allergic to morphine; sulfamethoxazole-trimethoprim; and trulicity [dulaglutide].  MEDICATIONS:  Current Outpatient Medications  Medication Sig Dispense Refill  . calcium acetate (PHOSLO) 667 MG capsule Take 1 capsule by mouth 3 (three) times daily.    . cyclobenzaprine (FLEXERIL) 10 MG tablet TAKE 1 TABLET BY MOUTH 3 TIMES A DAY FOR 10 DAYS 30 tablet 3  . metoprolol succinate (TOPROL-XL) 25 MG 24 hr tablet Take 25 mg by mouth daily.  11  . NIFEdipine (PROCARDIA-XL/ADALAT-CC/NIFEDICAL-XL) 30 MG 24 hr tablet Take 1 tablet (30 mg total) by mouth 2 (two) times daily. 60 tablet 6  . NOVOLOG FLEXPEN 100 UNIT/ML FlexPen 0-9 units injected 3 times daily with meals according to the following scale: CBG 70-120 = 0 units CBG 121-150 = 1 unit CBG 151-200 = 2 units CBG 201-250 = 3 units CBG 251-300 = 5 units CBG 301-350 = 7 units CBG 351-400 = 9 units CBG >400 = call MD for advice 15 mL 0  . paricalcitol (ZEMPLAR) 1 MCG capsule Take 1 mcg by mouth daily.     No current facility-administered medications for this visit.       Marland Kitchen  PHYSICAL EXAMINATION: ECOG PERFORMANCE STATUS: 1 - Symptomatic but completely ambulatory  Vitals:   05/20/18 0847  BP: (!) 156/88  Pulse: 92  Resp: 16  Temp: (!) 96.6 F (35.9 C)   Filed Weights   05/20/18 0847  Weight: 186 lb 9.6 oz (84.6 kg)    Physical Exam  Constitutional: She is oriented to person, place, and time and well-developed, well-nourished, and in no distress.  Patient is alone.  HENT:  Head: Normocephalic and atraumatic.  Mouth/Throat: Oropharynx is clear and moist. No oropharyngeal exudate.  Eyes: Pupils are equal, round, and reactive to light.  Neck: Normal range of motion. Neck supple.  Cardiovascular: Normal rate and regular rhythm.  Pulmonary/Chest: No respiratory distress. She  has no wheezes.  Abdominal: Soft. Bowel sounds are normal. She exhibits no distension and no mass. There is no abdominal tenderness. There is no rebound and no guarding.  Musculoskeletal: Normal range of motion.        General: No tenderness or edema.  Neurological: She is alert and oriented to person, place, and time.  Skin: Skin is warm.  Psychiatric: Affect normal.  ;  LABORATORY DATA:  I have reviewed the data as listed Lab Results  Component Value Date   WBC 4.9 06/08/2018   HGB 8.5 (L) 06/08/2018   HCT 27.0 (L) 06/08/2018   MCV 80.6 06/08/2018   PLT 294 06/08/2018   Recent Labs    07/03/17 1303  01/03/18 1240 03/10/18 0842  05/06/18 0923 05/20/18 0824 06/08/18 0907  NA 132*   < > 135 136   < >  140 137 138  K 4.2   < > 4.8 4.2   < > 4.5 4.1 3.9  CL 105   < > 105 104   < > 110 105 101  CO2 20*   < > 22 23   < > 21* 19* 31  GLUCOSE 150*   < > 207* 116*   < > 177* 266* 95  BUN 25*   < > 34* 59*   < > 62* 68* 26*  CREATININE 2.20*   < > 5.23* 7.00*   < > 8.95* 9.60* 5.39*  CALCIUM 8.3*   < > 8.5* 8.9   < > 7.6* 8.2* 7.9*  GFRNONAA 28*   < > 10* 7*   < > 5* 5* 10*  GFRAA 33*   < > 11* 8*   < > 6* 6* 11*  PROT 6.8   < > 6.3* 7.3  --   --  8.0  --   ALBUMIN 2.9*   < > 3.1* 3.5  --   --  4.1  --   AST 20   < > 13* 12*  --   --  12*  --   ALT 9*   < > 10 9  --   --  10  --   ALKPHOS 92   < > 112 156*  --   --  179*  --   BILITOT 0.7   < > 0.5 0.3  --   --  0.6  --   BILIDIR <0.1*  --   --   --   --   --   --   --   IBILI NOT CALCULATED  --   --   --   --   --   --   --    < > = values in this interval not displayed.    RADIOGRAPHIC STUDIES: I have personally reviewed the radiological images as listed and agreed with the findings in the report. Mm 3d Screen Breast Bilateral  Result Date: 06/15/2018 CLINICAL DATA:  Screening. EXAM: DIGITAL SCREENING BILATERAL MAMMOGRAM WITH TOMO AND CAD COMPARISON:  None. ACR Breast Density Category c: The breast tissue is  heterogeneously dense, which may obscure small masses FINDINGS: There are no findings suspicious for malignancy. Images were processed with CAD. IMPRESSION: No mammographic evidence of malignancy. A result letter of this screening mammogram will be mailed directly to the patient. RECOMMENDATION: Screening mammogram at age 44. (Code:SM-B-40A) BI-RADS CATEGORY  1: Negative. Electronically Signed   By: Dorise Bullion III M.D   On: 06/15/2018 13:29    ASSESSMENT & PLAN:   HUS (hemolytic uremic syndrome), atypical ([place]) # Atypical hemolytic uremic syndrome [diagnosed with [facility]]-on Ecluzimab infusions.stable  # Proceed with eculizumab infusion today.  Also awaiting reevaluation at [facility] on Jan 27th ~regarding duration of eculizumab infusion.   #Anemia-hemoglobin 9.1;  predominant CKD/has no active hemolysis noted. Procrit 40,000 units every 2 weeks.  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  Worsening creatinine- 9.6. s/p vascular; awaiting fistula in feb.   #P hypertension managed by [facility] nephrology-stable continue on Procardia metoprolol.  # DISPOSITION: # Infusion today; Procrit today.  # in 2 weeks- labs-cbc/bmp/ ecluzimab/procrit # in 4 weeks-Follow-up with MD /labs-cbc/bmp/LDHeculuzimab/procrit -Dr.B  Cc; Nephrology/Dr.Arepally.    All questions were answered. The patient knows to call the clinic with any problems, questions or concerns.    Cammie Sickle, MD 06/19/2018  6:02 PM

## 2018-06-01 ENCOUNTER — Encounter: Payer: Medicaid Other | Admitting: Obstetrics and Gynecology

## 2018-06-03 ENCOUNTER — Telehealth: Payer: Self-pay | Admitting: *Deleted

## 2018-06-03 ENCOUNTER — Ambulatory Visit: Payer: Medicaid Other | Admitting: Internal Medicine

## 2018-06-03 ENCOUNTER — Inpatient Hospital Stay: Payer: Medicaid Other

## 2018-06-03 MED ORDER — DEXTROSE 50 % IV SOLN
12.50 | INTRAVENOUS | Status: DC
Start: ? — End: 2018-06-03

## 2018-06-03 MED ORDER — HEPARIN SODIUM (PORCINE) 1000 UNIT/ML IJ SOLN
INTRAMUSCULAR | Status: DC
Start: ? — End: 2018-06-03

## 2018-06-03 MED ORDER — LIDOCAINE HCL 1 % IJ SOLN
.50 | INTRAMUSCULAR | Status: DC
Start: ? — End: 2018-06-03

## 2018-06-03 MED ORDER — METOPROLOL SUCCINATE ER 25 MG PO TB24
25.00 | ORAL_TABLET | ORAL | Status: DC
Start: 2018-06-04 — End: 2018-06-03

## 2018-06-03 MED ORDER — ACETAMINOPHEN 325 MG PO TABS
650.00 | ORAL_TABLET | ORAL | Status: DC
Start: ? — End: 2018-06-03

## 2018-06-03 MED ORDER — NIFEDIPINE ER OSMOTIC RELEASE 60 MG PO TB24
60.00 | ORAL_TABLET | ORAL | Status: DC
Start: 2018-06-03 — End: 2018-06-03

## 2018-06-03 MED ORDER — RENAL 1 MG PO CAPS
1.00 | ORAL_CAPSULE | ORAL | Status: DC
Start: 2018-06-04 — End: 2018-06-03

## 2018-06-03 MED ORDER — GENERIC EXTERNAL MEDICATION
4.00 | Status: DC
Start: ? — End: 2018-06-03

## 2018-06-03 MED ORDER — PARICALCITOL 1 MCG PO CAPS
1.00 | ORAL_CAPSULE | ORAL | Status: DC
Start: 2018-06-04 — End: 2018-06-03

## 2018-06-03 MED ORDER — CALCIUM ACETATE (PHOS BINDER) 667 MG PO CAPS
667.00 | ORAL_CAPSULE | ORAL | Status: DC
Start: 2018-06-03 — End: 2018-06-03

## 2018-06-03 MED ORDER — CYCLOBENZAPRINE HCL 10 MG PO TABS
10.00 | ORAL_TABLET | ORAL | Status: DC
Start: ? — End: 2018-06-03

## 2018-06-03 MED ORDER — GLUCAGON HCL RDNA (DIAGNOSTIC) 1 MG IJ SOLR
1.00 | INTRAMUSCULAR | Status: DC
Start: ? — End: 2018-06-03

## 2018-06-03 MED ORDER — GENERIC EXTERNAL MEDICATION
0.00 | Status: DC
Start: ? — End: 2018-06-03

## 2018-06-03 MED ORDER — SENNOSIDES-DOCUSATE SODIUM 8.6-50 MG PO TABS
2.00 | ORAL_TABLET | ORAL | Status: DC
Start: ? — End: 2018-06-03

## 2018-06-03 NOTE — Telephone Encounter (Signed)
Incoming call from [place]. Patient currently hospitalized at [facility] for complications of diabetes. She will be discharged this week. Nurse requesting to r/s her solaris treatment to next week

## 2018-06-07 ENCOUNTER — Inpatient Hospital Stay: Payer: Medicaid Other

## 2018-06-08 ENCOUNTER — Inpatient Hospital Stay: Payer: Medicaid Other | Attending: Internal Medicine

## 2018-06-08 ENCOUNTER — Inpatient Hospital Stay: Payer: Medicaid Other

## 2018-06-08 ENCOUNTER — Telehealth: Payer: Self-pay | Admitting: *Deleted

## 2018-06-08 VITALS — BP 161/102 | HR 92 | Temp 98.6°F | Resp 20

## 2018-06-08 DIAGNOSIS — Z8481 Family history of carrier of genetic disease: Secondary | ICD-10-CM

## 2018-06-08 DIAGNOSIS — D5939 Other hemolytic-uremic syndrome: Secondary | ICD-10-CM

## 2018-06-08 DIAGNOSIS — Z992 Dependence on renal dialysis: Secondary | ICD-10-CM | POA: Diagnosis not present

## 2018-06-08 DIAGNOSIS — N186 End stage renal disease: Secondary | ICD-10-CM | POA: Insufficient documentation

## 2018-06-08 DIAGNOSIS — D631 Anemia in chronic kidney disease: Secondary | ICD-10-CM | POA: Insufficient documentation

## 2018-06-08 DIAGNOSIS — Z5112 Encounter for antineoplastic immunotherapy: Secondary | ICD-10-CM | POA: Diagnosis present

## 2018-06-08 DIAGNOSIS — D593 Hemolytic-uremic syndrome: Secondary | ICD-10-CM | POA: Diagnosis present

## 2018-06-08 DIAGNOSIS — I120 Hypertensive chronic kidney disease with stage 5 chronic kidney disease or end stage renal disease: Secondary | ICD-10-CM | POA: Insufficient documentation

## 2018-06-08 LAB — BASIC METABOLIC PANEL
Anion gap: 6 (ref 5–15)
BUN: 26 mg/dL — ABNORMAL HIGH (ref 6–20)
CO2: 31 mmol/L (ref 22–32)
Calcium: 7.9 mg/dL — ABNORMAL LOW (ref 8.9–10.3)
Chloride: 101 mmol/L (ref 98–111)
Creatinine, Ser: 5.39 mg/dL — ABNORMAL HIGH (ref 0.44–1.00)
GFR calc Af Amer: 11 mL/min — ABNORMAL LOW (ref >60)
GFR calc non Af Amer: 10 mL/min — ABNORMAL LOW (ref >60)
Glucose, Bld: 95 mg/dL (ref 70–99)
Potassium: 3.9 mmol/L (ref 3.5–5.1)
Sodium: 138 mmol/L (ref 135–145)

## 2018-06-08 LAB — CBC WITH DIFFERENTIAL/PLATELET
Abs Immature Granulocytes: 0.01 10*3/uL (ref 0.00–0.07)
Basophils Absolute: 0.1 10*3/uL (ref 0.0–0.1)
Basophils Relative: 1 %
Eosinophils Absolute: 0.3 10*3/uL (ref 0.0–0.5)
Eosinophils Relative: 5 %
HCT: 27 % — ABNORMAL LOW (ref 36.0–46.0)
Hemoglobin: 8.5 g/dL — ABNORMAL LOW (ref 12.0–15.0)
Immature Granulocytes: 0 %
Lymphocytes Relative: 21 %
Lymphs Abs: 1 10*3/uL (ref 0.7–4.0)
MCH: 25.4 pg — ABNORMAL LOW (ref 26.0–34.0)
MCHC: 31.5 g/dL (ref 30.0–36.0)
MCV: 80.6 fL (ref 80.0–100.0)
Monocytes Absolute: 0.4 10*3/uL (ref 0.1–1.0)
Monocytes Relative: 8 %
Neutro Abs: 3.2 10*3/uL (ref 1.7–7.7)
Neutrophils Relative %: 65 %
Platelets: 294 10*3/uL (ref 150–400)
RBC: 3.35 MIL/uL — ABNORMAL LOW (ref 3.87–5.11)
RDW: 16.2 % — ABNORMAL HIGH (ref 11.5–15.5)
WBC: 4.9 10*3/uL (ref 4.0–10.5)
nRBC: 0 % (ref 0.0–0.2)

## 2018-06-08 LAB — LACTATE DEHYDROGENASE: LDH: 254 U/L — ABNORMAL HIGH (ref 98–192)

## 2018-06-08 MED ORDER — SODIUM CHLORIDE 0.9 % IV SOLN
Freq: Once | INTRAVENOUS | Status: AC
  Administered 2018-06-08: 10:00:00 via INTRAVENOUS
  Filled 2018-06-08: qty 250

## 2018-06-08 MED ORDER — SODIUM CHLORIDE 0.9 % IV SOLN
1200.0000 mg | Freq: Once | INTRAVENOUS | Status: AC
  Administered 2018-06-08: 1200 mg via INTRAVENOUS
  Filled 2018-06-08: qty 120

## 2018-06-08 NOTE — Telephone Encounter (Signed)
Dr. Rogue Bussing - (per Magda Paganini in chemotherapy) pt stated that she is now receiving procrit at dialysis.

## 2018-06-15 ENCOUNTER — Encounter (HOSPITAL_COMMUNITY): Payer: Self-pay

## 2018-06-15 ENCOUNTER — Ambulatory Visit
Admission: RE | Admit: 2018-06-15 | Discharge: 2018-06-15 | Disposition: A | Discharge status: Home / Self Care | Payer: Medicaid Other | Source: Ambulatory Visit | Attending: Obstetrics and Gynecology | Admitting: Obstetrics and Gynecology

## 2018-06-15 DIAGNOSIS — Z1231 Encounter for screening mammogram for malignant neoplasm of breast: Secondary | ICD-10-CM | POA: Diagnosis present

## 2018-06-15 DIAGNOSIS — Z8481 Family history of carrier of genetic disease: Secondary | ICD-10-CM | POA: Insufficient documentation

## 2018-06-16 ENCOUNTER — Telehealth: Payer: Self-pay | Admitting: *Deleted

## 2018-06-16 NOTE — Telephone Encounter (Signed)
msg sent to scheduling team to r/s pt's infusion to next Wed - 06/22/2018 per pt's request. Asked scheduling desk to contact patient with NEW apts.  labs- md cbc/bmp/ ecluzimab   Patient is not due for her treatment tomorrow for treatment. patient is off schedule with her chemo due to her recent hosptilazation. Last treatment was given last week and her tx cycle is every 2 weeks.   I contacted the patient to see what her dialysis schedule would be next week to coordinate this. (she is unable to do any apts on Tues and thursday due to dialysis.) She prefers chemo apt to be next Wednesday.

## 2018-06-17 ENCOUNTER — Inpatient Hospital Stay: Payer: Medicaid Other

## 2018-06-17 ENCOUNTER — Inpatient Hospital Stay: Payer: Medicaid Other | Admitting: Internal Medicine

## 2018-06-22 ENCOUNTER — Inpatient Hospital Stay: Payer: Medicaid Other

## 2018-06-22 ENCOUNTER — Other Ambulatory Visit: Payer: Self-pay

## 2018-06-22 ENCOUNTER — Inpatient Hospital Stay (HOSPITAL_BASED_OUTPATIENT_CLINIC_OR_DEPARTMENT_OTHER): Payer: Medicaid Other | Admitting: Internal Medicine

## 2018-06-22 VITALS — BP 142/89 | HR 109 | Temp 97.9°F | Resp 18 | Ht 67.0 in | Wt 184.0 lb

## 2018-06-22 DIAGNOSIS — I120 Hypertensive chronic kidney disease with stage 5 chronic kidney disease or end stage renal disease: Secondary | ICD-10-CM | POA: Diagnosis not present

## 2018-06-22 DIAGNOSIS — D593 Hemolytic-uremic syndrome: Secondary | ICD-10-CM

## 2018-06-22 DIAGNOSIS — N186 End stage renal disease: Secondary | ICD-10-CM | POA: Diagnosis not present

## 2018-06-22 DIAGNOSIS — Z5112 Encounter for antineoplastic immunotherapy: Secondary | ICD-10-CM | POA: Diagnosis not present

## 2018-06-22 DIAGNOSIS — D599 Acquired hemolytic anemia, unspecified: Secondary | ICD-10-CM

## 2018-06-22 DIAGNOSIS — D5939 Other hemolytic-uremic syndrome: Secondary | ICD-10-CM

## 2018-06-22 DIAGNOSIS — Z992 Dependence on renal dialysis: Secondary | ICD-10-CM

## 2018-06-22 DIAGNOSIS — D631 Anemia in chronic kidney disease: Secondary | ICD-10-CM | POA: Diagnosis not present

## 2018-06-22 DIAGNOSIS — N183 Chronic kidney disease, stage 3 unspecified: Secondary | ICD-10-CM

## 2018-06-22 LAB — CBC WITH DIFFERENTIAL/PLATELET
Abs Immature Granulocytes: 0.01 10*3/uL (ref 0.00–0.07)
Basophils Absolute: 0 10*3/uL (ref 0.0–0.1)
Basophils Relative: 0 %
Eosinophils Absolute: 0.2 10*3/uL (ref 0.0–0.5)
Eosinophils Relative: 4 %
HCT: 25 % — ABNORMAL LOW (ref 36.0–46.0)
Hemoglobin: 8.3 g/dL — ABNORMAL LOW (ref 12.0–15.0)
Immature Granulocytes: 0 %
Lymphocytes Relative: 26 %
Lymphs Abs: 1.2 10*3/uL (ref 0.7–4.0)
MCH: 26.5 pg (ref 26.0–34.0)
MCHC: 33.2 g/dL (ref 30.0–36.0)
MCV: 79.9 fL — ABNORMAL LOW (ref 80.0–100.0)
Monocytes Absolute: 0.4 10*3/uL (ref 0.1–1.0)
Monocytes Relative: 8 %
Neutro Abs: 2.8 10*3/uL (ref 1.7–7.7)
Neutrophils Relative %: 62 %
Platelets: 233 10*3/uL (ref 150–400)
RBC: 3.13 MIL/uL — ABNORMAL LOW (ref 3.87–5.11)
RDW: 15.8 % — ABNORMAL HIGH (ref 11.5–15.5)
WBC: 4.6 10*3/uL (ref 4.0–10.5)
nRBC: 0 % (ref 0.0–0.2)

## 2018-06-22 LAB — BASIC METABOLIC PANEL
Anion gap: 9 (ref 5–15)
BUN: 24 mg/dL — ABNORMAL HIGH (ref 6–20)
CO2: 27 mmol/L (ref 22–32)
Calcium: 8.2 mg/dL — ABNORMAL LOW (ref 8.9–10.3)
Chloride: 100 mmol/L (ref 98–111)
Creatinine, Ser: 6.06 mg/dL — ABNORMAL HIGH (ref 0.44–1.00)
GFR calc Af Amer: 10 mL/min — ABNORMAL LOW (ref >60)
GFR calc non Af Amer: 8 mL/min — ABNORMAL LOW (ref >60)
Glucose, Bld: 127 mg/dL — ABNORMAL HIGH (ref 70–99)
Potassium: 3.8 mmol/L (ref 3.5–5.1)
Sodium: 136 mmol/L (ref 135–145)

## 2018-06-22 LAB — LACTATE DEHYDROGENASE: LDH: 252 U/L — ABNORMAL HIGH (ref 98–192)

## 2018-06-22 MED ORDER — SODIUM CHLORIDE 0.9 % IV SOLN
1200.0000 mg | Freq: Once | INTRAVENOUS | Status: AC
  Administered 2018-06-22: 1200 mg via INTRAVENOUS
  Filled 2018-06-22: qty 120

## 2018-06-22 MED ORDER — EPOETIN ALFA 40000 UNIT/ML IJ SOLN
40000.0000 [IU] | Freq: Once | INTRAMUSCULAR | Status: AC
  Administered 2018-06-22: 40000 [IU] via SUBCUTANEOUS
  Filled 2018-06-22: qty 1

## 2018-06-22 MED ORDER — SODIUM CHLORIDE 0.9 % IV SOLN
Freq: Once | INTRAVENOUS | Status: AC
  Administered 2018-06-22: 10:00:00 via INTRAVENOUS
  Filled 2018-06-22: qty 250

## 2018-06-22 NOTE — Progress Notes (Signed)
HR 109, ok to proceed

## 2018-06-22 NOTE — Assessment & Plan Note (Addendum)
#   Atypical hemolytic uremic syndrome [diagnosed with [facility]]-on Ecluzimab infusions. STABLE.   # Proceed with eculizumab infusion today.  Hemoglobin 8.1 [multifactorial see discussion below].  Patient interested in switching over to Ultimoris infusions given convenience every 8 weeks infusion.  Will check with [facility] rheumatology.  #Anemia-hemoglobin 8.1;/ ESRD. Procrit 40,000 units every 2 weeks.  Stable proceed with procrit today.  Recommend-manage Procrit and IV iron infusions through dialysis.Patient does not recall her nephrologist/she will call us with the information.    # ESRD- on HD [Tues/Thur/sat]- [facility]- [place].  Patient interested in peritoneal/defer to nephrology.  # hypertension managed by [facility] nephrology-stable continue on Procardia metoprolol.  # DISPOSITION: # Infusion today; Procrit today.  # in 2 weeks- labs-cbc/LDH/ecluzimab/procrit # in 4 weeks-Follow-up with MD /labs-cbc/LDHeculuzimab/procrit -Dr.B  Addendum: Discussed with Dr.Arepally, agrees with switching over to Ultimoris; will try to get insurance approval. Will discuss with pharmacy.    Cc: Dr.Arapally over to

## 2018-06-22 NOTE — Progress Notes (Signed)
[place] NOTE  Patient Care Team: Rubie Maid, MD as PCP - General (Obstetrics and Gynecology) [name], FNP as Consulting Physician (Family Medicine) Azzie Glatter, MD as Consulting Physician (Internal Medicine) Sherryll Burger, MD as Consulting Physician (Nephrology)  CHIEF COMPLAINTS/PURPOSE OF CONSULTATION: Atypical HUS  # ATYPICAL HEMOLYTIC UREMIC SYNDROME [April 2019-Dx; [facility]; Dr.Arepally;s/p Plex x1; April 26th- Ecluzimab 1200 mg q 2W  # AKI/CKD [dec 2018;FEB 2020- hemodialysis [[facility] in [place]; Tuesday Thursday Saturday]  # IDDM [since age of 51- Dr.Kerr,/Endo]  # WORK UP for HUS [[facility]] Phospholipase A2 Receptor AB/ S Phospholipase A2 Receptor IFA-NEGATIVE    No history exists.     HISTORY OF PRESENTING ILLNESS:  Kelly Huff 33 y.o.  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  Patient was recently evaluated by hematology at [facility].  Patient in the interim also started on hemodialysis [[facility] in [place]; Tuesday Thursday Saturday].   Patient interested in kidney transplantation.  Awaiting donor/evaluation.  Patient states that she received IV iron in dialysis yesterday.  She is not sure if she is getting Procrit.  Review of Systems  Constitutional: Positive for malaise/fatigue. Negative for chills, diaphoresis, fever and weight loss.  HENT: Negative for nosebleeds and sore throat.   Eyes: Negative for double vision.  Respiratory: Negative for cough, hemoptysis, sputum production, shortness of breath and wheezing.   Cardiovascular: Negative for chest pain, palpitations, orthopnea and leg swelling.  Gastrointestinal: Negative for abdominal pain, blood in stool, constipation, diarrhea, heartburn, melena, nausea and vomiting.  Genitourinary: Negative for dysuria, frequency and urgency.  Musculoskeletal: Negative for back pain and joint pain.  Skin:  Negative.  Negative for itching and rash.  Neurological: Negative for dizziness, tingling, focal weakness, weakness and headaches.  Endo/Heme/Allergies: Does not bruise/bleed easily.  Psychiatric/Behavioral: Negative for depression. The patient is not nervous/anxious and does not have insomnia.     MEDICAL HISTORY:  Past Medical History:  Diagnosis Date  . Chronic kidney disease   . Diabetes mellitus without complication ([place])   . History of pre-eclampsia 2016   mild  . Hypertension     SURGICAL HISTORY: Past Surgical History:  Procedure Laterality Date  . CESAREAN SECTION    . DILATION AND CURETTAGE OF UTERUS     x 4    SOCIAL HISTORY: Social History   Socioeconomic History  . Marital status: Single    Spouse name: Not on file  . Number of children: Not on file  . Years of education: Not on file  . Highest education level: Not on file  Occupational History  . Not on file  Social Needs  . Financial resource strain: Not on file  . Food insecurity:    Worry: Not on file    Inability: Not on file  . Transportation needs:    Medical: Not on file    Non-medical: Not on file  Tobacco Use  . Smoking status: Never Smoker  . Smokeless tobacco: Never Used  Substance and Sexual Activity  . Alcohol use: No  . Drug use: No  . Sexual activity: Yes    Birth control/protection: None, Surgical    Comment: tubial  Lifestyle  . Physical activity:    Days per week: Not on file    Minutes per session: Not on file  . Stress: Not on file  Relationships  . Social connections:    Talks on phone: Not on file    Gets together:  Not on file    Attends religious service: Not on file    Active member of club or organization: Not on file    Attends meetings of clubs or organizations: Not on file    Relationship status: Not on file  . Intimate partner violence:    Fear of current or ex partner: Not on file    Emotionally abused: Not on file    Physically abused: Not on file     Forced sexual activity: Not on file  Other Topics Concern  . Not on file  Social History Narrative  . Not on file    FAMILY HISTORY: Family History  Problem Relation Age of Onset  . Hypertension Mother   . Cancer Mother        Uterine vs cervical (pt unsure),   . Schizophrenia Brother   . Breast cancer Neg Hx     ALLERGIES:  is allergic to morphine; sulfamethoxazole-trimethoprim; and trulicity [dulaglutide].  MEDICATIONS:  Current Outpatient Medications  Medication Sig Dispense Refill  . B Complex-C-Folic Acid ([place] CAPS) 1 MG CAPS Take 1 mg by mouth daily.    . calcium acetate (PHOSLO) 667 MG capsule Take 1 capsule by mouth 3 (three) times daily.    . clobetasol ointment (TEMOVATE) 6.50 % Apply 1 application topically 2 (two) times daily as needed for itching.    . cyclobenzaprine (FLEXERIL) 10 MG tablet TAKE 1 TABLET BY MOUTH 3 TIMES A DAY FOR 10 DAYS 30 tablet 3  . metoprolol succinate (TOPROL-XL) 25 MG 24 hr tablet Take 25 mg by mouth daily.  11  . NIFEdipine (PROCARDIA-XL/ADALAT-CC/NIFEDICAL-XL) 30 MG 24 hr tablet Take 1 tablet (30 mg total) by mouth 2 (two) times daily. 60 tablet 6  . NOVOLOG FLEXPEN 100 UNIT/ML FlexPen 0-9 units injected 3 times daily with meals according to the following scale: CBG 70-120 = 0 units CBG 121-150 = 1 unit CBG 151-200 = 2 units CBG 201-250 = 3 units CBG 251-300 = 5 units CBG 301-350 = 7 units CBG 351-400 = 9 units CBG >400 = call MD for advice 15 mL 0  . paricalcitol (ZEMPLAR) 1 MCG capsule Take 1 mcg by mouth daily.     No current facility-administered medications for this visit.       Marland Kitchen  PHYSICAL EXAMINATION: ECOG PERFORMANCE STATUS: 1 - Symptomatic but completely ambulatory  Vitals:   06/22/18 0858  BP: (!) 142/89  Pulse: (!) 109  Resp: 18  Temp: 97.9 F (36.6 C)   Filed Weights   06/22/18 0858  Weight: 184 lb (83.5 kg)    Physical Exam  Constitutional: She is oriented to person, place, and time and well-developed,  well-nourished, and in no distress.  Patient is alone.  HENT:  Head: Normocephalic and atraumatic.  Mouth/Throat: Oropharynx is clear and moist. No oropharyngeal exudate.  Eyes: Pupils are equal, round, and reactive to light.  Neck: Normal range of motion. Neck supple.  Cardiovascular: Normal rate and regular rhythm.  Pulmonary/Chest: No respiratory distress. She has no wheezes.  Abdominal: Soft. Bowel sounds are normal. She exhibits no distension and no mass. There is no abdominal tenderness. There is no rebound and no guarding.  Musculoskeletal: Normal range of motion.        General: No tenderness or edema.  Neurological: She is alert and oriented to person, place, and time.  Skin: Skin is warm.  Psychiatric: Affect normal.  ;  LABORATORY DATA:  I have reviewed the data as listed Lab  Results  Component Value Date   WBC 4.6 06/22/2018   HGB 8.3 (L) 06/22/2018   HCT 25.0 (L) 06/22/2018   MCV 79.9 (L) 06/22/2018   PLT 233 06/22/2018   Recent Labs    07/03/17 1303  01/03/18 1240 03/10/18 0842  05/20/18 0824 06/08/18 0907 06/22/18 0845  NA 132*   < > 135 136   < > 137 138 136  K 4.2   < > 4.8 4.2   < > 4.1 3.9 3.8  CL 105   < > 105 104   < > 105 101 100  CO2 20*   < > 22 23   < > 19* 31 27  GLUCOSE 150*   < > 207* 116*   < > 266* 95 127*  BUN 25*   < > 34* 59*   < > 68* 26* 24*  CREATININE 2.20*   < > 5.23* 7.00*   < > 9.60* 5.39* 6.06*  CALCIUM 8.3*   < > 8.5* 8.9   < > 8.2* 7.9* 8.2*  GFRNONAA 28*   < > 10* 7*   < > 5* 10* 8*  GFRAA 33*   < > 11* 8*   < > 6* 11* 10*  PROT 6.8   < > 6.3* 7.3  --  8.0  --   --   ALBUMIN 2.9*   < > 3.1* 3.5  --  4.1  --   --   AST 20   < > 13* 12*  --  12*  --   --   ALT 9*   < > 10 9  --  10  --   --   ALKPHOS 92   < > 112 156*  --  179*  --   --   BILITOT 0.7   < > 0.5 0.3  --  0.6  --   --   BILIDIR <0.1*  --   --   --   --   --   --   --   IBILI NOT CALCULATED  --   --   --   --   --   --   --    < > = values in this interval not  displayed.    RADIOGRAPHIC STUDIES: I have personally reviewed the radiological images as listed and agreed with the findings in the report. Mm 3d Screen Breast Bilateral  Result Date: 06/15/2018 CLINICAL DATA:  Screening. EXAM: DIGITAL SCREENING BILATERAL MAMMOGRAM WITH TOMO AND CAD COMPARISON:  None. ACR Breast Density Category c: The breast tissue is heterogeneously dense, which may obscure small masses FINDINGS: There are no findings suspicious for malignancy. Images were processed with CAD. IMPRESSION: No mammographic evidence of malignancy. A result letter of this screening mammogram will be mailed directly to the patient. RECOMMENDATION: Screening mammogram at age 77. (Code:SM-B-40A) BI-RADS CATEGORY  1: Negative. Electronically Signed   By: Dorise Bullion III M.D   On: 06/15/2018 13:29    ASSESSMENT & PLAN:   HUS (hemolytic uremic syndrome), atypical ([place]) # Atypical hemolytic uremic syndrome [diagnosed with [facility]]-on Ecluzimab infusions. STABLE.   # Proceed with eculizumab infusion today.  Hemoglobin 8.1 [multifactorial see discussion below].  Patient interested in switching over to Ultimoris infusions given convenience every 8 weeks infusion.  Will check with [facility] rheumatology.  #Anemia-hemoglobin 8.1;/ ESRD. Procrit 40,000 units every 2 weeks.  Stable proceed with procrit today.  Recommend-manage Procrit and IV iron infusions through dialysis.Patient does not  recall her nephrologist/she will call us with the information.    # ESRD- on HD [Tues/Thur/sat]- [facility]- [place].  Patient interested in peritoneal/defer to nephrology.  # hypertension managed by [facility] nephrology-stable continue on Procardia metoprolol.  # DISPOSITION: # Infusion today; Procrit today.  # in 2 weeks- labs-cbc/LDH/ecluzimab/procrit # in 4 weeks-Follow-up with MD /labs-cbc/LDHeculuzimab/procrit -Dr.B  Addendum: Discussed with Dr.Arepally, agrees with switching over to Ultimoris; will try to get insurance  approval. Will discuss with pharmacy.    Cc: Dr.Arapally over to   All questions were answered. The patient knows to call the clinic with any problems, questions or concerns.    Cammie Sickle, MD 06/22/2018 10:58 AM

## 2018-06-30 DIAGNOSIS — Z01818 Encounter for other preprocedural examination: Secondary | ICD-10-CM | POA: Insufficient documentation

## 2018-07-06 ENCOUNTER — Inpatient Hospital Stay: Payer: Medicaid Other | Attending: Internal Medicine

## 2018-07-06 ENCOUNTER — Inpatient Hospital Stay: Payer: Medicaid Other

## 2018-07-06 ENCOUNTER — Other Ambulatory Visit: Payer: Self-pay

## 2018-07-06 VITALS — BP 161/97 | HR 96 | Temp 96.8°F | Resp 18 | Ht 67.0 in | Wt 185.0 lb

## 2018-07-06 DIAGNOSIS — I120 Hypertensive chronic kidney disease with stage 5 chronic kidney disease or end stage renal disease: Secondary | ICD-10-CM | POA: Insufficient documentation

## 2018-07-06 DIAGNOSIS — D599 Acquired hemolytic anemia, unspecified: Secondary | ICD-10-CM

## 2018-07-06 DIAGNOSIS — Z5112 Encounter for antineoplastic immunotherapy: Secondary | ICD-10-CM | POA: Diagnosis present

## 2018-07-06 DIAGNOSIS — D593 Hemolytic-uremic syndrome: Secondary | ICD-10-CM | POA: Diagnosis present

## 2018-07-06 DIAGNOSIS — D631 Anemia in chronic kidney disease: Secondary | ICD-10-CM | POA: Diagnosis not present

## 2018-07-06 DIAGNOSIS — N186 End stage renal disease: Secondary | ICD-10-CM | POA: Insufficient documentation

## 2018-07-06 DIAGNOSIS — E1122 Type 2 diabetes mellitus with diabetic chronic kidney disease: Secondary | ICD-10-CM | POA: Insufficient documentation

## 2018-07-06 DIAGNOSIS — D5939 Other hemolytic-uremic syndrome: Secondary | ICD-10-CM

## 2018-07-06 DIAGNOSIS — N183 Chronic kidney disease, stage 3 unspecified: Secondary | ICD-10-CM

## 2018-07-06 LAB — BASIC METABOLIC PANEL
Anion gap: 8 (ref 5–15)
BUN: 24 mg/dL — ABNORMAL HIGH (ref 6–20)
CO2: 26 mmol/L (ref 22–32)
Calcium: 8.3 mg/dL — ABNORMAL LOW (ref 8.9–10.3)
Chloride: 101 mmol/L (ref 98–111)
Creatinine, Ser: 5.41 mg/dL — ABNORMAL HIGH (ref 0.44–1.00)
GFR calc Af Amer: 11 mL/min — ABNORMAL LOW (ref >60)
GFR calc non Af Amer: 10 mL/min — ABNORMAL LOW (ref >60)
Glucose, Bld: 154 mg/dL — ABNORMAL HIGH (ref 70–99)
Potassium: 3.5 mmol/L (ref 3.5–5.1)
Sodium: 135 mmol/L (ref 135–145)

## 2018-07-06 LAB — CBC WITH DIFFERENTIAL/PLATELET
Abs Immature Granulocytes: 0.02 10*3/uL (ref 0.00–0.07)
Basophils Absolute: 0 10*3/uL (ref 0.0–0.1)
Basophils Relative: 1 %
Eosinophils Absolute: 0.3 10*3/uL (ref 0.0–0.5)
Eosinophils Relative: 5 %
HCT: 24.9 % — ABNORMAL LOW (ref 36.0–46.0)
Hemoglobin: 8.3 g/dL — ABNORMAL LOW (ref 12.0–15.0)
Immature Granulocytes: 0 %
Lymphocytes Relative: 24 %
Lymphs Abs: 1.3 10*3/uL (ref 0.7–4.0)
MCH: 26.2 pg (ref 26.0–34.0)
MCHC: 33.3 g/dL (ref 30.0–36.0)
MCV: 78.5 fL — ABNORMAL LOW (ref 80.0–100.0)
Monocytes Absolute: 0.4 10*3/uL (ref 0.1–1.0)
Monocytes Relative: 7 %
Neutro Abs: 3.4 10*3/uL (ref 1.7–7.7)
Neutrophils Relative %: 63 %
Platelets: 217 10*3/uL (ref 150–400)
RBC: 3.17 MIL/uL — ABNORMAL LOW (ref 3.87–5.11)
RDW: 16.8 % — ABNORMAL HIGH (ref 11.5–15.5)
WBC: 5.3 10*3/uL (ref 4.0–10.5)
nRBC: 0 % (ref 0.0–0.2)

## 2018-07-06 MED ORDER — SODIUM CHLORIDE 0.9 % IV SOLN
Freq: Once | INTRAVENOUS | Status: AC
  Administered 2018-07-06: 09:00:00 via INTRAVENOUS
  Filled 2018-07-06: qty 250

## 2018-07-06 MED ORDER — EPOETIN ALFA 40000 UNIT/ML IJ SOLN
40000.0000 [IU] | Freq: Once | INTRAMUSCULAR | Status: AC
  Administered 2018-07-06: 40000 [IU] via SUBCUTANEOUS

## 2018-07-06 MED ORDER — SODIUM CHLORIDE 0.9 % IV SOLN
1200.0000 mg | Freq: Once | INTRAVENOUS | Status: AC
  Administered 2018-07-06: 1200 mg via INTRAVENOUS
  Filled 2018-07-06: qty 120

## 2018-07-10 ENCOUNTER — Encounter (HOSPITAL_COMMUNITY): Payer: Self-pay | Admitting: Emergency Medicine

## 2018-07-10 ENCOUNTER — Emergency Department (HOSPITAL_COMMUNITY)
Admission: EM | Admit: 2018-07-10 | Discharge: 2018-07-10 | Disposition: A | Discharge status: Home / Self Care | Payer: Medicaid Other | Attending: Emergency Medicine | Admitting: Emergency Medicine

## 2018-07-10 ENCOUNTER — Emergency Department (HOSPITAL_COMMUNITY): Payer: Medicaid Other

## 2018-07-10 ENCOUNTER — Other Ambulatory Visit: Payer: Self-pay

## 2018-07-10 DIAGNOSIS — N186 End stage renal disease: Secondary | ICD-10-CM | POA: Diagnosis not present

## 2018-07-10 DIAGNOSIS — I120 Hypertensive chronic kidney disease with stage 5 chronic kidney disease or end stage renal disease: Secondary | ICD-10-CM | POA: Diagnosis not present

## 2018-07-10 DIAGNOSIS — R109 Unspecified abdominal pain: Secondary | ICD-10-CM | POA: Diagnosis present

## 2018-07-10 DIAGNOSIS — E109 Type 1 diabetes mellitus without complications: Secondary | ICD-10-CM | POA: Insufficient documentation

## 2018-07-10 DIAGNOSIS — K529 Noninfective gastroenteritis and colitis, unspecified: Secondary | ICD-10-CM | POA: Insufficient documentation

## 2018-07-10 LAB — CBC
HCT: 29.4 % — ABNORMAL LOW (ref 36.0–46.0)
Hemoglobin: 9.4 g/dL — ABNORMAL LOW (ref 12.0–15.0)
MCH: 25.5 pg — ABNORMAL LOW (ref 26.0–34.0)
MCHC: 32 g/dL (ref 30.0–36.0)
MCV: 79.7 fL — ABNORMAL LOW (ref 80.0–100.0)
Platelets: 315 10*3/uL (ref 150–400)
RBC: 3.69 MIL/uL — ABNORMAL LOW (ref 3.87–5.11)
RDW: 17.9 % — ABNORMAL HIGH (ref 11.5–15.5)
WBC: 7.6 10*3/uL (ref 4.0–10.5)
nRBC: 0 % (ref 0.0–0.2)

## 2018-07-10 LAB — LIPASE, BLOOD: Lipase: 23 U/L (ref 11–51)

## 2018-07-10 LAB — COMPREHENSIVE METABOLIC PANEL
ALT: 12 U/L (ref 0–44)
AST: 17 U/L (ref 15–41)
Albumin: 3.2 g/dL — ABNORMAL LOW (ref 3.5–5.0)
Alkaline Phosphatase: 124 U/L (ref 38–126)
Anion gap: 14 (ref 5–15)
BUN: 43 mg/dL — ABNORMAL HIGH (ref 6–20)
CO2: 21 mmol/L — ABNORMAL LOW (ref 22–32)
Calcium: 9.1 mg/dL (ref 8.9–10.3)
Chloride: 100 mmol/L (ref 98–111)
Creatinine, Ser: 8.84 mg/dL — ABNORMAL HIGH (ref 0.44–1.00)
GFR calc Af Amer: 6 mL/min — ABNORMAL LOW (ref >60)
GFR calc non Af Amer: 5 mL/min — ABNORMAL LOW (ref >60)
Glucose, Bld: 194 mg/dL — ABNORMAL HIGH (ref 70–99)
Potassium: 3.6 mmol/L (ref 3.5–5.1)
Sodium: 135 mmol/L (ref 135–145)
Total Bilirubin: 0.5 mg/dL (ref 0.3–1.2)
Total Protein: 6.4 g/dL — ABNORMAL LOW (ref 6.5–8.1)

## 2018-07-10 LAB — URINALYSIS, ROUTINE W REFLEX MICROSCOPIC
Bilirubin Urine: NEGATIVE
Glucose, UA: NEGATIVE mg/dL
Ketones, ur: NEGATIVE mg/dL
Leukocytes,Ua: NEGATIVE
Nitrite: NEGATIVE
Protein, ur: 100 mg/dL — AB
Specific Gravity, Urine: 1.017 (ref 1.005–1.030)
pH: 6 (ref 5.0–8.0)

## 2018-07-10 LAB — I-STAT BETA HCG BLOOD, ED (MC, WL, AP ONLY): I-stat hCG, quantitative: <5 m[IU]/mL (ref <5)

## 2018-07-10 MED ORDER — SODIUM CHLORIDE 0.9 % IV BOLUS
500.0000 mL | Freq: Once | INTRAVENOUS | Status: AC
  Administered 2018-07-10: 500 mL via INTRAVENOUS

## 2018-07-10 MED ORDER — ALUM & MAG HYDROXIDE-SIMETH 200-200-20 MG/5ML PO SUSP
30.0000 mL | Freq: Once | ORAL | Status: AC
  Administered 2018-07-10: 30 mL via ORAL
  Filled 2018-07-10: qty 30

## 2018-07-10 MED ORDER — DICYCLOMINE HCL 20 MG PO TABS: 20.0000 mg | ORAL_TABLET | Freq: Two times a day (BID) | ORAL | 0 refills | Status: DC

## 2018-07-10 MED ORDER — ONDANSETRON 4 MG PO TBDP: 4.0000 mg | ORAL_TABLET | Freq: Three times a day (TID) | ORAL | 0 refills | Status: DC | PRN

## 2018-07-10 MED ORDER — FENTANYL CITRATE (PF) 100 MCG/2ML IJ SOLN
50.0000 ug | Freq: Once | INTRAMUSCULAR | Status: AC
  Administered 2018-07-10: 50 ug via INTRAVENOUS
  Filled 2018-07-10: qty 2

## 2018-07-10 MED ORDER — DICYCLOMINE HCL 10 MG PO CAPS
10.0000 mg | ORAL_CAPSULE | Freq: Once | ORAL | Status: AC
  Administered 2018-07-10: 10 mg via ORAL
  Filled 2018-07-10: qty 1

## 2018-07-10 MED ORDER — LIDOCAINE VISCOUS HCL 2 % MT SOLN
15.0000 mL | Freq: Once | OROMUCOSAL | Status: AC
  Administered 2018-07-10: 15 mL via ORAL
  Filled 2018-07-10: qty 15

## 2018-07-10 MED ORDER — ACETAMINOPHEN 325 MG PO TABS
650.0000 mg | ORAL_TABLET | Freq: Once | ORAL | Status: AC
  Administered 2018-07-10: 650 mg via ORAL
  Filled 2018-07-10: qty 2

## 2018-07-10 NOTE — Discharge Instructions (Addendum)
You were evaluated in the Emergency Department and after careful evaluation, we did not find any emergent condition requiring admission or further testing in the hospital.  Your symptoms today seem to be due to a virus causing inflammation of the small intestines.  Please use the medications provided to help with your symptoms, progress your diet slowly as discussed.  Keep your dialysis session for tomorrow.  Please return to the Emergency Department if you experience any worsening of your condition.  We encourage you to follow up with a primary care provider.  Thank you for allowing [place] to be a part of your care.

## 2018-07-10 NOTE — ED Provider Notes (Signed)
[facility] Emergency Department Provider Note MRN:  [id number]  Arrival date & time: 07/10/18     Chief Complaint   Abdominal Pain   History of Present Illness   Kelly Huff is a 33 y.o. year-old female with a history of ESRD, type 1 diabetes presenting to the ED with chief complaint of abdominal pain.  2 days of diffuse crampy abdominal pain, most notable in the left upper quadrant as well as the suprapubic region.  Gradual onset, not going away.  Associated with nausea, one episode nonbloody nonbilious emesis.  Several episodes of watery diarrhea.  No recent antibiotics.  No recent travel.  Denies chest pain or shortness of breath.  No exacerbating relieving factors.  Symptoms are constant, moderate in severity.  No exacerbating or alleviating factors.  Review of Systems  A complete 10 system review of systems was obtained and all systems are negative except as noted in the HPI and PMH.   Patient's Health History    Past Medical History:  Diagnosis Date  . Chronic kidney disease   . Diabetes mellitus without complication ([place])   . History of pre-eclampsia 2016   mild  . Hypertension     Past Surgical History:  Procedure Laterality Date  . CESAREAN SECTION    . DILATION AND CURETTAGE OF UTERUS     x 4    Family History  Problem Relation Age of Onset  . Hypertension Mother   . Cancer Mother        Uterine vs cervical (pt unsure),   . Schizophrenia Brother   . Breast cancer Neg Hx     Social History   Socioeconomic History  . Marital status: Single    Spouse name: Not on file  . Number of children: Not on file  . Years of education: Not on file  . Highest education level: Not on file  Occupational History  . Not on file  Social Needs  . Financial resource strain: Not on file  . Food insecurity:    Worry: Not on file    Inability: Not on file  . Transportation needs:    Medical: Not on file    Non-medical: Not on file  Tobacco Use  .  Smoking status: Never Smoker  . Smokeless tobacco: Never Used  Substance and Sexual Activity  . Alcohol use: No  . Drug use: No  . Sexual activity: Yes    Birth control/protection: None, Surgical    Comment: tubial  Lifestyle  . Physical activity:    Days per week: Not on file    Minutes per session: Not on file  . Stress: Not on file  Relationships  . Social connections:    Talks on phone: Not on file    Gets together: Not on file    Attends religious service: Not on file    Active member of club or organization: Not on file    Attends meetings of clubs or organizations: Not on file    Relationship status: Not on file  . Intimate partner violence:    Fear of current or ex partner: Not on file    Emotionally abused: Not on file    Physically abused: Not on file    Forced sexual activity: Not on file  Other Topics Concern  . Not on file  Social History Narrative  . Not on file     Physical Exam  Vital Signs and Nursing Notes reviewed Vitals:   07/10/18 0731  07/10/18 0830  BP: (!) 192/108 (!) 178/110  Pulse: (!) 110 89  Resp: 18 16  Temp:    SpO2: 100% 100%    CONSTITUTIONAL: Well-appearing, NAD NEURO:  Alert and oriented x 3, no focal deficits EYES:  eyes equal and reactive ENT/NECK:  no LAD, no JVD CARDIO: Regular rate, well-perfused, normal S1 and S2 PULM:  CTAB no wheezing or rhonchi GI/GU:  normal bowel sounds, non-distended, mild diffuse tenderness to palpation MSK/SPINE:  No gross deformities, no edema SKIN:  no rash, atraumatic PSYCH:  Appropriate speech and behavior  Diagnostic and Interventional Summary    Labs Reviewed  COMPREHENSIVE METABOLIC PANEL - Abnormal; Notable for the following components:      Result Value   CO2 21 (*)    Glucose, Bld 194 (*)    BUN 43 (*)    Creatinine, Ser 8.84 (*)    Total Protein 6.4 (*)    Albumin 3.2 (*)    GFR calc non Af Amer 5 (*)    GFR calc Af Amer 6 (*)    All other components within normal limits  CBC  - Abnormal; Notable for the following components:   RBC 3.69 (*)    Hemoglobin 9.4 (*)    HCT 29.4 (*)    MCV 79.7 (*)    MCH 25.5 (*)    RDW 17.9 (*)    All other components within normal limits  URINALYSIS, ROUTINE W REFLEX MICROSCOPIC - Abnormal; Notable for the following components:   APPearance HAZY (*)    Hgb urine dipstick SMALL (*)    Protein, ur 100 (*)    Bacteria, UA RARE (*)    All other components within normal limits  LIPASE, BLOOD  I-STAT BETA HCG BLOOD, ED ([facility], [facility], [facility] ONLY)    CT ABDOMEN PELVIS WO CONTRAST  Final Result      Medications  sodium chloride 0.9 % bolus 500 mL (0 mLs Intravenous Stopped 07/10/18 0827)  alum & mag hydroxide-simeth (MAALOX/MYLANTA) 200-200-20 MG/5ML suspension 30 mL (30 mLs Oral Given 07/10/18 0720)    And  lidocaine (XYLOCAINE) 2 % viscous mouth solution 15 mL (15 mLs Oral Given 07/10/18 0720)  dicyclomine (BENTYL) capsule 10 mg (10 mg Oral Given 07/10/18 0721)  fentaNYL (SUBLIMAZE) injection 50 mcg (50 mcg Intravenous Given 07/10/18 0836)     Procedures Critical Care  ED Course and Medical Decision Making  I have reviewed the triage vital signs and the nursing notes.  Pertinent labs & imaging results that were available during my care of the patient were reviewed by me and considered in my medical decision making (see below for details).  Suspect benign etiology of abdominal crampy discomfort, likely viral GI illness given the nausea, diarrhea, soft abdomen, normal vital signs.  Shared decision making utilized with patient giving the most notable tenderness being in the left upper quadrant.  Will attempt symptom control and hold on CT imaging at this time.  Clinical Course as of Jul 09 1144  Sun Jul 10, 2018  0821 Patient with little to no improvement after attempted symptomatic management, exam seems to have worsened, now with right lower quadrant tenderness.  CT abdomen pelvis pending, cannot use IV contrast.  Patient is thin would  likely benefit from p.o. contrast.   [MB]    Clinical Course User Index [MB] Maudie Flakes, MD    CT reveals focal area of circumferential swelling of the jejunum, radiology comments on favoring hematoma versus infectious process.  Patient denies any trauma to her abdomen.  Discussed this with radiology who clarifies that the findings are fairly nonspecific.  Given patient's recent vomiting and diarrhea, favoring viral process.  Tolerating p.o. here in the ED and appropriate for discharge with strict return precautions.  Patient is euvolemic and with a potassium of 3.4 and is appropriate for dialysis tomorrow.  After the discussed management above, the patient was determined to be safe for discharge.  The patient was in agreement with this plan and all questions regarding their care were answered.  ED return precautions were discussed and the patient will return to the ED with any significant worsening of condition.  Barth Kirks. Sedonia Small, [place] [email or web address]  Final Clinical Impressions(s) / ED Diagnoses     ICD-10-CM   1. Enteritis K52.9     ED Discharge Orders         Ordered    dicyclomine (BENTYL) 20 MG tablet  2 times daily     07/10/18 1146    ondansetron (ZOFRAN ODT) 4 MG disintegrating tablet  Every 8 hours PRN     07/10/18 1146             Maudie Flakes, MD 07/10/18 1148

## 2018-07-10 NOTE — ED Triage Notes (Signed)
Pt here for abdominal pain, diarrhea, and vomiting since Friday. Pt states she had dialysis on Wednesday and called dialysis yesterday to see if she should come in. Since her illness they rescheduled her for Monday.

## 2018-07-10 NOTE — ED Triage Notes (Signed)
C/o abd. Pain onset Friday states she has used over the counter meds without relief. States she went to dialysis on Thurs, states she didn't feel good after dialysis was taken off 45 mins early . States she vomited x 2 has had several episodes of diarrhea

## 2018-07-10 NOTE — ED Notes (Signed)
Nurse drawing labs. 

## 2018-07-10 NOTE — ED Notes (Addendum)
CT aware pt has finished oral contrast, CT scheduled at 1000 Pt ok to use restroom

## 2018-07-10 NOTE — ED Notes (Signed)
ED Provider at bedside. 

## 2018-07-18 ENCOUNTER — Other Ambulatory Visit: Payer: Self-pay | Admitting: Internal Medicine

## 2018-07-18 DIAGNOSIS — N19 Unspecified kidney failure: Secondary | ICD-10-CM | POA: Insufficient documentation

## 2018-07-18 NOTE — Progress Notes (Signed)
2700 

## 2018-07-19 ENCOUNTER — Encounter: Payer: Medicaid Other | Admitting: Obstetrics and Gynecology

## 2018-07-19 ENCOUNTER — Other Ambulatory Visit: Payer: Self-pay

## 2018-07-20 ENCOUNTER — Inpatient Hospital Stay (HOSPITAL_BASED_OUTPATIENT_CLINIC_OR_DEPARTMENT_OTHER): Payer: Medicaid Other | Admitting: Internal Medicine

## 2018-07-20 ENCOUNTER — Encounter: Payer: Self-pay | Admitting: Internal Medicine

## 2018-07-20 ENCOUNTER — Inpatient Hospital Stay: Payer: Medicaid Other

## 2018-07-20 ENCOUNTER — Other Ambulatory Visit: Payer: Self-pay

## 2018-07-20 VITALS — BP 174/114 | HR 96 | Resp 18

## 2018-07-20 DIAGNOSIS — D593 Hemolytic-uremic syndrome: Secondary | ICD-10-CM

## 2018-07-20 DIAGNOSIS — E1122 Type 2 diabetes mellitus with diabetic chronic kidney disease: Secondary | ICD-10-CM

## 2018-07-20 DIAGNOSIS — O99019 Anemia complicating pregnancy, unspecified trimester: Secondary | ICD-10-CM

## 2018-07-20 DIAGNOSIS — I120 Hypertensive chronic kidney disease with stage 5 chronic kidney disease or end stage renal disease: Secondary | ICD-10-CM

## 2018-07-20 DIAGNOSIS — D509 Iron deficiency anemia, unspecified: Secondary | ICD-10-CM

## 2018-07-20 DIAGNOSIS — D631 Anemia in chronic kidney disease: Secondary | ICD-10-CM | POA: Diagnosis not present

## 2018-07-20 DIAGNOSIS — D5939 Other hemolytic-uremic syndrome: Secondary | ICD-10-CM

## 2018-07-20 DIAGNOSIS — N19 Unspecified kidney failure: Secondary | ICD-10-CM

## 2018-07-20 DIAGNOSIS — Z5112 Encounter for antineoplastic immunotherapy: Secondary | ICD-10-CM | POA: Diagnosis not present

## 2018-07-20 DIAGNOSIS — N186 End stage renal disease: Secondary | ICD-10-CM

## 2018-07-20 LAB — BASIC METABOLIC PANEL
Anion gap: 11 (ref 5–15)
BUN: 15 mg/dL (ref 6–20)
CO2: 25 mmol/L (ref 22–32)
Calcium: 8.7 mg/dL — ABNORMAL LOW (ref 8.9–10.3)
Chloride: 98 mmol/L (ref 98–111)
Creatinine, Ser: 5.67 mg/dL — ABNORMAL HIGH (ref 0.44–1.00)
GFR calc Af Amer: 10 mL/min — ABNORMAL LOW (ref >60)
GFR calc non Af Amer: 9 mL/min — ABNORMAL LOW (ref >60)
Glucose, Bld: 365 mg/dL — ABNORMAL HIGH (ref 70–99)
Potassium: 3.9 mmol/L (ref 3.5–5.1)
Sodium: 134 mmol/L — ABNORMAL LOW (ref 135–145)

## 2018-07-20 LAB — SAMPLE TO BLOOD BANK

## 2018-07-20 LAB — CBC WITH DIFFERENTIAL/PLATELET
Abs Immature Granulocytes: 0.02 10*3/uL (ref 0.00–0.07)
Basophils Absolute: 0 10*3/uL (ref 0.0–0.1)
Basophils Relative: 1 %
Eosinophils Absolute: 0.2 10*3/uL (ref 0.0–0.5)
Eosinophils Relative: 3 %
HCT: 28.1 % — ABNORMAL LOW (ref 36.0–46.0)
Hemoglobin: 9 g/dL — ABNORMAL LOW (ref 12.0–15.0)
Immature Granulocytes: 0 %
Lymphocytes Relative: 23 %
Lymphs Abs: 1.4 10*3/uL (ref 0.7–4.0)
MCH: 25.8 pg — ABNORMAL LOW (ref 26.0–34.0)
MCHC: 32 g/dL (ref 30.0–36.0)
MCV: 80.5 fL (ref 80.0–100.0)
Monocytes Absolute: 0.4 10*3/uL (ref 0.1–1.0)
Monocytes Relative: 7 %
Neutro Abs: 4.2 10*3/uL (ref 1.7–7.7)
Neutrophils Relative %: 66 %
Platelets: 371 10*3/uL (ref 150–400)
RBC: 3.49 MIL/uL — ABNORMAL LOW (ref 3.87–5.11)
RDW: 17.2 % — ABNORMAL HIGH (ref 11.5–15.5)
WBC: 6.3 10*3/uL (ref 4.0–10.5)
nRBC: 0 % (ref 0.0–0.2)

## 2018-07-20 MED ORDER — SODIUM CHLORIDE 0.9 % IV SOLN
2700.0000 mg | Freq: Once | INTRAVENOUS | Status: AC
  Administered 2018-07-20: 2700 mg via INTRAVENOUS
  Filled 2018-07-20: qty 270

## 2018-07-20 MED ORDER — SODIUM CHLORIDE 0.9 % IV SOLN
Freq: Once | INTRAVENOUS | Status: AC
  Administered 2018-07-20: 11:00:00 via INTRAVENOUS
  Filled 2018-07-20: qty 250

## 2018-07-20 NOTE — Progress Notes (Signed)
[facility] NOTE  Patient Care Team: Rubie Maid, MD as PCP - General (Obstetrics and Gynecology) [name], FNP as Consulting Physician (Family Medicine) Azzie Glatter, MD as Consulting Physician (Internal Medicine) Sherryll Burger, MD as Consulting Physician (Nephrology)  CHIEF COMPLAINTS/PURPOSE OF CONSULTATION: Atypical HUS  # ATYPICAL HEMOLYTIC UREMIC SYNDROME [April 2019-Dx; [facility]; Dr.Arepally;s/p Plex x1; April 26th- Ecluzimab 1200 mg q 2W; march 25th 2020- Ultomiris q  8 weeks.   # AKI/CKD [name] 2018;FEB 2020- hemodialysis [[facility] in [place]; Tuesday Thursday Saturday]  # IDDM [since age of 54- Dr.Kerr,/Endo]  # WORK UP for HUS [[facility]] Phospholipase A2 Receptor AB/ S Phospholipase A2 Receptor IFA-NEGATIVE    No history exists.     HISTORY OF PRESENTING ILLNESS:  Kelly Huff 33 y.o.  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  Patient continues to get hemodialysis.  She denies any headaches or leg swelling.  She states that she did not take her blood pressure medication this morning.    Review of Systems  Constitutional: Positive for malaise/fatigue. Negative for chills, diaphoresis, fever and weight loss.  HENT: Negative for nosebleeds and sore throat.   Eyes: Negative for double vision.  Respiratory: Negative for cough, hemoptysis, sputum production, shortness of breath and wheezing.   Cardiovascular: Negative for chest pain, palpitations, orthopnea and leg swelling.  Gastrointestinal: Negative for abdominal pain, blood in stool, constipation, diarrhea, heartburn, melena, nausea and vomiting.  Genitourinary: Negative for dysuria, frequency and urgency.  Musculoskeletal: Negative for back pain and joint pain.  Skin: Negative.  Negative for itching and rash.  Neurological: Negative for dizziness, tingling, focal weakness, weakness and headaches.   Endo/Heme/Allergies: Does not bruise/bleed easily.  Psychiatric/Behavioral: Negative for depression. The patient is not nervous/anxious and does not have insomnia.     MEDICAL HISTORY:  Past Medical History:  Diagnosis Date  . Chronic kidney disease   . Diabetes mellitus without complication ([place])   . History of pre-eclampsia 2016   mild  . Hypertension     SURGICAL HISTORY: Past Surgical History:  Procedure Laterality Date  . CESAREAN SECTION    . DILATION AND CURETTAGE OF UTERUS     x 4    SOCIAL HISTORY: Social History   Socioeconomic History  . Marital status: Single    Spouse name: Not on file  . Number of children: Not on file  . Years of education: Not on file  . Highest education level: Not on file  Occupational History  . Not on file  Social Needs  . Financial resource strain: Not on file  . Food insecurity:    Worry: Not on file    Inability: Not on file  . Transportation needs:    Medical: Not on file    Non-medical: Not on file  Tobacco Use  . Smoking status: Never Smoker  . Smokeless tobacco: Never Used  Substance and Sexual Activity  . Alcohol use: No  . Drug use: No  . Sexual activity: Yes    Birth control/protection: None, Surgical    Comment: tubial  Lifestyle  . Physical activity:    Days per week: Not on file    Minutes per session: Not on file  . Stress: Not on file  Relationships  . Social connections:    Talks on phone: Not on file    Gets together: Not on file    Attends religious service: Not on file    Active  member of club or organization: Not on file    Attends meetings of clubs or organizations: Not on file    Relationship status: Not on file  . Intimate partner violence:    Fear of current or ex partner: Not on file    Emotionally abused: Not on file    Physically abused: Not on file    Forced sexual activity: Not on file  Other Topics Concern  . Not on file  Social History Narrative  . Not on file    FAMILY  HISTORY: Family History  Problem Relation Age of Onset  . Hypertension Mother   . Cancer Mother        Uterine vs cervical (pt unsure),   . Schizophrenia Brother   . Breast cancer Neg Hx     ALLERGIES:  is allergic to morphine; sulfamethoxazole-trimethoprim; and trulicity [dulaglutide].  MEDICATIONS:  Current Outpatient Medications  Medication Sig Dispense Refill  . calcium acetate (PHOSLO) 667 MG capsule Take 1 capsule by mouth 3 (three) times daily.    . clobetasol ointment (TEMOVATE) 1.61 % Apply 1 application topically 2 (two) times daily as needed for itching.    . cyclobenzaprine (FLEXERIL) 10 MG tablet TAKE 1 TABLET BY MOUTH 3 TIMES A DAY FOR 10 DAYS (Patient taking differently: Take 10 mg by mouth 3 (three) times daily as needed for muscle spasms. ) 30 tablet 3  . dicyclomine (BENTYL) 20 MG tablet Take 1 tablet (20 mg total) by mouth 2 (two) times daily. 20 tablet 0  . metoprolol succinate (TOPROL-XL) 25 MG 24 hr tablet Take 25 mg by mouth daily.  11  . NIFEdipine (PROCARDIA-XL/ADALAT-CC/NIFEDICAL-XL) 30 MG 24 hr tablet Take 1 tablet (30 mg total) by mouth 2 (two) times daily. (Patient taking differently: Take 60 mg by mouth 2 (two) times daily. ) 60 tablet 6  . NOVOLOG FLEXPEN 100 UNIT/ML FlexPen 0-9 units injected 3 times daily with meals according to the following scale: CBG 70-120 = 0 units CBG 121-150 = 1 unit CBG 151-200 = 2 units CBG 201-250 = 3 units CBG 251-300 = 5 units CBG 301-350 = 7 units CBG 351-400 = 9 units CBG >400 = call MD for advice 15 mL 0  . ondansetron (ZOFRAN ODT) 4 MG disintegrating tablet Take 1 tablet (4 mg total) by mouth every 8 (eight) hours as needed for nausea or vomiting. 20 tablet 0  . paricalcitol (ZEMPLAR) 1 MCG capsule Take 1 mcg by mouth daily.     No current facility-administered medications for this visit.       Marland Kitchen  PHYSICAL EXAMINATION: ECOG PERFORMANCE STATUS: 1 - Symptomatic but completely ambulatory  Vitals:   07/20/18 0859   BP: (!) 163/111  Pulse: (!) 103  Resp: 16   Filed Weights   07/20/18 0859  Weight: 182 lb 12.8 oz (82.9 kg)    Physical Exam  Constitutional: She is oriented to person, place, and time and well-developed, well-nourished, and in no distress.  Patient is alone.  HENT:  Head: Normocephalic and atraumatic.  Mouth/Throat: Oropharynx is clear and moist. No oropharyngeal exudate.  Eyes: Pupils are equal, round, and reactive to light.  Neck: Normal range of motion. Neck supple.  Cardiovascular: Normal rate and regular rhythm.  Pulmonary/Chest: No respiratory distress. She has no wheezes.  Abdominal: Soft. Bowel sounds are normal. She exhibits no distension and no mass. There is no abdominal tenderness. There is no rebound and no guarding.  Musculoskeletal: Normal range of motion.  General: No tenderness or edema.  Neurological: She is alert and oriented to person, place, and time.  Skin: Skin is warm.  Psychiatric: Affect normal.  ;  LABORATORY DATA:  I have reviewed the data as listed Lab Results  Component Value Date   WBC 6.3 07/20/2018   HGB 9.0 (L) 07/20/2018   HCT 28.1 (L) 07/20/2018   MCV 80.5 07/20/2018   PLT 371 07/20/2018   Recent Labs    03/10/18 0842  05/20/18 0824  07/06/18 0825 07/10/18 0554 07/20/18 0825  NA 136   < > 137   < > 135 135 134*  K 4.2   < > 4.1   < > 3.5 3.6 3.9  CL 104   < > 105   < > 101 100 98  CO2 23   < > 19*   < > 26 21* 25  GLUCOSE 116*   < > 266*   < > 154* 194* 365*  BUN 59*   < > 68*   < > 24* 43* 15  CREATININE 7.00*   < > 9.60*   < > 5.41* 8.84* 5.67*  CALCIUM 8.9   < > 8.2*   < > 8.3* 9.1 8.7*  GFRNONAA 7*   < > 5*   < > 10* 5* 9*  GFRAA 8*   < > 6*   < > 11* 6* 10*  PROT 7.3  --  8.0  --   --  6.4*  --   ALBUMIN 3.5  --  4.1  --   --  3.2*  --   AST 12*  --  12*  --   --  17  --   ALT 9  --  10  --   --  12  --   ALKPHOS 156*  --  179*  --   --  124  --   BILITOT 0.3  --  0.6  --   --  0.5  --    < > = values in  this interval not displayed.    RADIOGRAPHIC STUDIES: I have personally reviewed the radiological images as listed and agreed with the findings in the report. Ct Abdomen Pelvis Wo Contrast  Result Date: 07/10/2018 CLINICAL DATA:  34 year old female with acute abdominal and pelvic pain, started 3 days ago during dialysis. EXAM: CT ABDOMEN AND PELVIS WITHOUT CONTRAST TECHNIQUE: Multidetector CT imaging of the abdomen and pelvis was performed following the standard protocol without IV contrast. COMPARISON:  None. FINDINGS: Please note that parenchymal abnormalities may be missed without intravenous contrast. Lower chest: Small pericardial effusion is noted. Hepatobiliary: The liver and gallbladder are unremarkable. No biliary dilatation. Pancreas: Unremarkable Spleen: Unremarkable Adrenals/Urinary Tract: The kidneys, adrenal glands and bladder are unremarkable. Stomach/Bowel: There is diffuse circumferential wall thickening of the proximal jejunum (approximately 15 cm in length). No other bowel abnormalities are identified. There is no evidence of bowel obstruction or pneumoperitoneum. The appendix is normal. Vascular/Lymphatic: No significant vascular findings are present. No enlarged abdominal or pelvic lymph nodes. Reproductive: Uterus and bilateral adnexa are unremarkable. Other: A trace amount of free pelvic fluid is noted. No focal collection or abdominal wall hernia. Musculoskeletal: No acute or suspicious abnormalities. Chronic appearing changes involving the LEFT bony pelvis noted. IMPRESSION: 1. Diffuse circumferential wall thickening of the proximal jejunum without bowel obstruction or other bowel abnormalities. Favor intramural hematoma given history but enteritis/infection or nonspecific inflammation not excluded. 2. Small pericardial effusion and trace pelvic fluid. Electronically  Signed   By: Margarette Canada M.D.   On: 07/10/2018 10:56    ASSESSMENT & PLAN:   HUS (hemolytic uremic syndrome),  atypical ([place]) # Atypical hemolytic uremic syndrome -on Ecluzimab infusions. Hb ~9/ stable.  #However given patient's preference/cut on the visits to the clinic I would recommend switching to longer acting infusions. Recommend over to Ultimoris infusions given convenience every 8 weeks infusion.  Infusion #1 today loading dose; again in 2 weeks.  Thereafter stop every 8 weeks.  Discussed with Dr. Mariam Dollar; in agreement.  #Anemia-hemoglobin 8.1;/ ESRD.  Discussed with the dialysis center patient is getting Aranesp 100 mcg weekly [last 3/20].  Defer to nephrology/ dialysis for further management  # ESRD- on HD [Tues/Thur/sat]- [facility]- [place]-clinically stable.  # hypertension-poorly controlled labile; defer to nephrology.  Increase the patient to talk to her nephrologist regarding optimization of her blood pressure medications.  # # Educated the patient regarding novel coronavirus-modes of transmission/risks; and measures to avoid infection.   # DISPOSITION: # Treatment today # Ultomiris in 2 weeks [2 hours] # follow up in 10 weeks [from now]- MD/labs-cbc.cmp/ldh; Ultomiris-Dr.B   All questions were answered. The patient knows to call the clinic with any problems, questions or concerns.    Cammie Sickle, MD 07/21/2018 8:15 AM

## 2018-07-20 NOTE — Assessment & Plan Note (Addendum)
#   Atypical hemolytic uremic syndrome -on Ecluzimab infusions. Hb ~9/ stable.  #However given patient's preference/cut on the visits to the clinic I would recommend switching to longer acting infusions. Recommend over to Ultimoris infusions given convenience every 8 weeks infusion.  Infusion #1 today loading dose; again in 2 weeks.  Thereafter stop every 8 weeks.  Discussed with Dr. Mariam Dollar; in agreement.  #Anemia-hemoglobin 8.1;/ ESRD.  Discussed with the dialysis center patient is getting Aranesp 100 mcg weekly [last 3/20].  Defer to nephrology/ dialysis for further management  # ESRD- on HD [Tues/Thur/sat]- [facility]- [place]-clinically stable.  # hypertension-poorly controlled labile; defer to nephrology.  Increase the patient to talk to her nephrologist regarding optimization of her blood pressure medications.  # # Educated the patient regarding novel coronavirus-modes of transmission/risks; and measures to avoid infection.   # DISPOSITION: # Treatment today # Ultomiris in 2 weeks [2 hours] # follow up in 10 weeks [from now]- MD/labs-cbc.cmp/ldh; Ultomiris-Dr.B

## 2018-07-27 ENCOUNTER — Encounter (HOSPITAL_COMMUNITY): Payer: Self-pay | Admitting: Emergency Medicine

## 2018-07-27 ENCOUNTER — Inpatient Hospital Stay (HOSPITAL_COMMUNITY)
Admission: EM | Admit: 2018-07-27 | Discharge: 2018-07-29 | DRG: 391 | Disposition: A | Discharge status: Home / Self Care | Payer: Medicaid Other | Attending: Internal Medicine | Admitting: Internal Medicine

## 2018-07-27 ENCOUNTER — Other Ambulatory Visit: Payer: Self-pay

## 2018-07-27 ENCOUNTER — Emergency Department (HOSPITAL_COMMUNITY): Payer: Medicaid Other

## 2018-07-27 DIAGNOSIS — A09 Infectious gastroenteritis and colitis, unspecified: Principal | ICD-10-CM | POA: Diagnosis present

## 2018-07-27 DIAGNOSIS — Z885 Allergy status to narcotic agent status: Secondary | ICD-10-CM

## 2018-07-27 DIAGNOSIS — I120 Hypertensive chronic kidney disease with stage 5 chronic kidney disease or end stage renal disease: Secondary | ICD-10-CM | POA: Diagnosis present

## 2018-07-27 DIAGNOSIS — D593 Hemolytic-uremic syndrome, unspecified: Secondary | ICD-10-CM | POA: Diagnosis present

## 2018-07-27 DIAGNOSIS — D5939 Other hemolytic-uremic syndrome: Secondary | ICD-10-CM | POA: Diagnosis present

## 2018-07-27 DIAGNOSIS — M898X9 Other specified disorders of bone, unspecified site: Secondary | ICD-10-CM | POA: Diagnosis present

## 2018-07-27 DIAGNOSIS — N186 End stage renal disease: Secondary | ICD-10-CM | POA: Diagnosis present

## 2018-07-27 DIAGNOSIS — R739 Hyperglycemia, unspecified: Secondary | ICD-10-CM

## 2018-07-27 DIAGNOSIS — E860 Dehydration: Secondary | ICD-10-CM | POA: Diagnosis present

## 2018-07-27 DIAGNOSIS — Z8249 Family history of ischemic heart disease and other diseases of the circulatory system: Secondary | ICD-10-CM

## 2018-07-27 DIAGNOSIS — I160 Hypertensive urgency: Secondary | ICD-10-CM

## 2018-07-27 DIAGNOSIS — I10 Essential (primary) hypertension: Secondary | ICD-10-CM

## 2018-07-27 DIAGNOSIS — Z888 Allergy status to other drugs, medicaments and biological substances status: Secondary | ICD-10-CM

## 2018-07-27 DIAGNOSIS — Z809 Family history of malignant neoplasm, unspecified: Secondary | ICD-10-CM

## 2018-07-27 DIAGNOSIS — E103299 Type 1 diabetes mellitus with mild nonproliferative diabetic retinopathy without macular edema, unspecified eye: Secondary | ICD-10-CM | POA: Diagnosis present

## 2018-07-27 DIAGNOSIS — I152 Hypertension secondary to endocrine disorders: Secondary | ICD-10-CM

## 2018-07-27 DIAGNOSIS — K529 Noninfective gastroenteritis and colitis, unspecified: Secondary | ICD-10-CM | POA: Diagnosis not present

## 2018-07-27 DIAGNOSIS — Z9641 Presence of insulin pump (external) (internal): Secondary | ICD-10-CM | POA: Diagnosis present

## 2018-07-27 DIAGNOSIS — E1065 Type 1 diabetes mellitus with hyperglycemia: Secondary | ICD-10-CM | POA: Diagnosis present

## 2018-07-27 DIAGNOSIS — Z818 Family history of other mental and behavioral disorders: Secondary | ICD-10-CM

## 2018-07-27 DIAGNOSIS — E1159 Type 2 diabetes mellitus with other circulatory complications: Secondary | ICD-10-CM

## 2018-07-27 DIAGNOSIS — E1069 Type 1 diabetes mellitus with other specified complication: Secondary | ICD-10-CM | POA: Diagnosis present

## 2018-07-27 DIAGNOSIS — E103219 Type 1 diabetes mellitus with mild nonproliferative diabetic retinopathy with macular edema, unspecified eye: Secondary | ICD-10-CM | POA: Diagnosis present

## 2018-07-27 DIAGNOSIS — N2581 Secondary hyperparathyroidism of renal origin: Secondary | ICD-10-CM | POA: Diagnosis present

## 2018-07-27 DIAGNOSIS — Z794 Long term (current) use of insulin: Secondary | ICD-10-CM

## 2018-07-27 DIAGNOSIS — E1022 Type 1 diabetes mellitus with diabetic chronic kidney disease: Secondary | ICD-10-CM | POA: Diagnosis present

## 2018-07-27 DIAGNOSIS — Z882 Allergy status to sulfonamides status: Secondary | ICD-10-CM

## 2018-07-27 DIAGNOSIS — Z992 Dependence on renal dialysis: Secondary | ICD-10-CM

## 2018-07-27 LAB — COMPREHENSIVE METABOLIC PANEL
ALT: 14 U/L (ref 0–44)
AST: 17 U/L (ref 15–41)
Albumin: 2.9 g/dL — ABNORMAL LOW (ref 3.5–5.0)
Alkaline Phosphatase: 145 U/L — ABNORMAL HIGH (ref 38–126)
Anion gap: 10 (ref 5–15)
BUN: 20 mg/dL (ref 6–20)
CO2: 23 mmol/L (ref 22–32)
Calcium: 8.7 mg/dL — ABNORMAL LOW (ref 8.9–10.3)
Chloride: 99 mmol/L (ref 98–111)
Creatinine, Ser: 6.4 mg/dL — ABNORMAL HIGH (ref 0.44–1.00)
GFR calc Af Amer: 9 mL/min — ABNORMAL LOW (ref >60)
GFR calc non Af Amer: 8 mL/min — ABNORMAL LOW (ref >60)
Glucose, Bld: 492 mg/dL — ABNORMAL HIGH (ref 70–99)
Potassium: 4.4 mmol/L (ref 3.5–5.1)
Sodium: 132 mmol/L — ABNORMAL LOW (ref 135–145)
Total Bilirubin: 0.7 mg/dL (ref 0.3–1.2)
Total Protein: 6.1 g/dL — ABNORMAL LOW (ref 6.5–8.1)

## 2018-07-27 LAB — CBC
HCT: 34.3 % — ABNORMAL LOW (ref 36.0–46.0)
Hemoglobin: 11 g/dL — ABNORMAL LOW (ref 12.0–15.0)
MCH: 25.9 pg — ABNORMAL LOW (ref 26.0–34.0)
MCHC: 32.1 g/dL (ref 30.0–36.0)
MCV: 80.9 fL (ref 80.0–100.0)
Platelets: 177 10*3/uL (ref 150–400)
RBC: 4.24 MIL/uL (ref 3.87–5.11)
RDW: 18.5 % — ABNORMAL HIGH (ref 11.5–15.5)
WBC: 9.4 10*3/uL (ref 4.0–10.5)
nRBC: 0 % (ref 0.0–0.2)

## 2018-07-27 LAB — CBG MONITORING, ED
Glucose-Capillary: 440 mg/dL — ABNORMAL HIGH (ref 70–99)
Glucose-Capillary: 282 mg/dL — ABNORMAL HIGH (ref 70–99)

## 2018-07-27 LAB — LIPASE, BLOOD: Lipase: 29 U/L (ref 11–51)

## 2018-07-27 LAB — LACTIC ACID, PLASMA: Lactic Acid, Venous: 1.4 mmol/L (ref 0.5–1.9)

## 2018-07-27 MED ORDER — DICYCLOMINE HCL 10 MG/ML IM SOLN
20.0000 mg | Freq: Once | INTRAMUSCULAR | Status: AC
  Administered 2018-07-27: 20 mg via INTRAMUSCULAR
  Filled 2018-07-27: qty 2

## 2018-07-27 MED ORDER — NIFEDIPINE ER OSMOTIC RELEASE 60 MG PO TB24
60.0000 mg | ORAL_TABLET | Freq: Two times a day (BID) | ORAL | Status: DC
  Administered 2018-07-28 – 2018-07-29 (×3): 60 mg via ORAL
  Filled 2018-07-27 (×3): qty 1

## 2018-07-27 MED ORDER — METOPROLOL SUCCINATE ER 25 MG PO TB24
25.0000 mg | ORAL_TABLET | Freq: Every day | ORAL | Status: DC
  Administered 2018-07-28 – 2018-07-29 (×2): 25 mg via ORAL
  Filled 2018-07-27 (×2): qty 1

## 2018-07-27 MED ORDER — HYDROMORPHONE HCL 1 MG/ML IJ SOLN
0.5000 mg | INTRAMUSCULAR | Status: AC | PRN
  Administered 2018-07-28 (×3): 0.5 mg via INTRAVENOUS
  Filled 2018-07-27 (×3): qty 1

## 2018-07-27 MED ORDER — INSULIN ASPART 100 UNIT/ML ~~LOC~~ SOLN
5.0000 [IU] | Freq: Once | SUBCUTANEOUS | Status: AC
  Administered 2018-07-27: 5 [IU] via SUBCUTANEOUS

## 2018-07-27 MED ORDER — DICYCLOMINE HCL 20 MG PO TABS: 20.0000 mg | ORAL_TABLET | Freq: Two times a day (BID) | ORAL | 0 refills | Status: DC

## 2018-07-27 MED ORDER — ONDANSETRON HCL 4 MG PO TABS: 4.0000 mg | ORAL_TABLET | Freq: Four times a day (QID) | ORAL | Status: DC | PRN

## 2018-07-27 MED ORDER — ONDANSETRON HCL 4 MG/2ML IJ SOLN
4.0000 mg | Freq: Once | INTRAMUSCULAR | Status: AC
  Administered 2018-07-27: 4 mg via INTRAVENOUS
  Filled 2018-07-27: qty 2

## 2018-07-27 MED ORDER — INSULIN ASPART 100 UNIT/ML ~~LOC~~ SOLN: 0.0000 [IU] | SUBCUTANEOUS | Status: DC

## 2018-07-27 MED ORDER — ACETAMINOPHEN 650 MG RE SUPP: 650.0000 mg | Freq: Four times a day (QID) | RECTAL | Status: DC | PRN

## 2018-07-27 MED ORDER — AMOXICILLIN-POT CLAVULANATE 500-125 MG PO TABS: 1.0000 | ORAL_TABLET | Freq: Every day | ORAL | 0 refills | Status: DC

## 2018-07-27 MED ORDER — METOCLOPRAMIDE HCL 5 MG/ML IJ SOLN
5.0000 mg | Freq: Three times a day (TID) | INTRAMUSCULAR | Status: DC
  Administered 2018-07-28 – 2018-07-29 (×4): 5 mg via INTRAVENOUS
  Filled 2018-07-27 (×4): qty 2

## 2018-07-27 MED ORDER — SODIUM CHLORIDE 0.9 % IV BOLUS
500.0000 mL | Freq: Once | INTRAVENOUS | Status: AC
  Administered 2018-07-27: 500 mL via INTRAVENOUS

## 2018-07-27 MED ORDER — LABETALOL HCL 5 MG/ML IV SOLN
5.0000 mg | Freq: Once | INTRAVENOUS | Status: AC
  Administered 2018-07-27: 5 mg via INTRAVENOUS
  Filled 2018-07-27: qty 4

## 2018-07-27 MED ORDER — FENTANYL CITRATE (PF) 100 MCG/2ML IJ SOLN
50.0000 ug | Freq: Once | INTRAMUSCULAR | Status: AC
  Administered 2018-07-27: 50 ug via INTRAVENOUS
  Filled 2018-07-27: qty 2

## 2018-07-27 MED ORDER — ACETAMINOPHEN 325 MG PO TABS
650.0000 mg | ORAL_TABLET | Freq: Four times a day (QID) | ORAL | Status: DC | PRN
  Administered 2018-07-28 – 2018-07-29 (×2): 650 mg via ORAL
  Filled 2018-07-27 (×2): qty 2

## 2018-07-27 MED ORDER — ONDANSETRON HCL 4 MG/2ML IJ SOLN
4.0000 mg | Freq: Four times a day (QID) | INTRAMUSCULAR | Status: DC | PRN
  Administered 2018-07-28 – 2018-07-29 (×2): 4 mg via INTRAVENOUS
  Filled 2018-07-27 (×2): qty 2

## 2018-07-27 MED ORDER — METOCLOPRAMIDE HCL 5 MG/ML IJ SOLN
10.0000 mg | Freq: Once | INTRAMUSCULAR | Status: AC
  Administered 2018-07-27: 10 mg via INTRAVENOUS
  Filled 2018-07-27: qty 2

## 2018-07-27 MED ORDER — PARICALCITOL 1 MCG PO CAPS
1.0000 ug | ORAL_CAPSULE | Freq: Every day | ORAL | Status: DC
  Administered 2018-07-28: 1 ug via ORAL
  Filled 2018-07-27: qty 1

## 2018-07-27 MED ORDER — HYDRALAZINE HCL 20 MG/ML IJ SOLN: 5.0000 mg | INTRAMUSCULAR | Status: DC | PRN

## 2018-07-27 MED ORDER — HEPARIN SODIUM (PORCINE) 5000 UNIT/ML IJ SOLN
5000.0000 [IU] | Freq: Three times a day (TID) | INTRAMUSCULAR | Status: DC
  Administered 2018-07-28 – 2018-07-29 (×4): 5000 [IU] via SUBCUTANEOUS
  Filled 2018-07-27 (×3): qty 1

## 2018-07-27 MED ORDER — ONDANSETRON 4 MG PO TBDP: 4.0000 mg | ORAL_TABLET | Freq: Three times a day (TID) | ORAL | 0 refills | Status: DC | PRN

## 2018-07-27 MED ORDER — AMOXICILLIN-POT CLAVULANATE 500-125 MG PO TABS
1.0000 | ORAL_TABLET | Freq: Once | ORAL | Status: AC
  Administered 2018-07-27: 500 mg via ORAL
  Filled 2018-07-27: qty 1

## 2018-07-27 MED ORDER — SODIUM CHLORIDE 0.9 % IV SOLN
1.5000 g | INTRAVENOUS | Status: DC
  Administered 2018-07-28: 1.5 g via INTRAVENOUS
  Filled 2018-07-27 (×2): qty 1.5

## 2018-07-27 MED ORDER — OXYCODONE HCL 5 MG PO TABS
5.0000 mg | ORAL_TABLET | ORAL | Status: DC | PRN
  Administered 2018-07-28 – 2018-07-29 (×3): 5 mg via ORAL
  Filled 2018-07-27 (×2): qty 1

## 2018-07-27 MED ORDER — CALCIUM ACETATE (PHOS BINDER) 667 MG PO CAPS
667.0000 mg | ORAL_CAPSULE | Freq: Three times a day (TID) | ORAL | Status: DC
  Administered 2018-07-29 (×2): 667 mg via ORAL
  Filled 2018-07-27 (×3): qty 1

## 2018-07-27 MED ORDER — SODIUM CHLORIDE 0.9 % IV BOLUS
500.0000 mL | Freq: Once | INTRAVENOUS | Status: AC
  Administered 2018-07-28: 500 mL via INTRAVENOUS

## 2018-07-27 MED ORDER — METOPROLOL TARTRATE 25 MG PO TABS
25.0000 mg | ORAL_TABLET | Freq: Once | ORAL | Status: AC
  Administered 2018-07-27: 25 mg via ORAL
  Filled 2018-07-27: qty 1

## 2018-07-27 NOTE — H&P (Signed)
History and Physical    Kelly Huff GHW:[id number] DOB: April 08, 1986 DOA: 07/27/2018  PCP: Rubie Maid, MD  Patient coming from: Home  I have personally briefly reviewed patient's old medical records in [place]  Chief Complaint: Abdominal pain with nausea, vomiting, diarrhea  HPI: Kelly Huff is a 33 y.o. 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    Patient initially presented to the ED on 07/10/2018 with similar symptoms at which time a CT abdomen/pelvis showed thickening of the proximal jejunum suggesting intramural hematoma versus enteritis/infection.  Given patient's symptoms and EDP discussion with radiology it was favored that her symptoms were related to a viral gastroenteritis and she was discharged with oral Bentyl and antinausea medicines that she was tolerating p.o. intake at that time.  Patient had continued mild symptoms but was able to tolerate oral liquids and her home medications without significant further nausea/vomiting.  She received her scheduled eculizumab infusion on 07/20/2018.  She went to her usual dialysis session yesterday, Tuesday, 07/26/2018.  On the morning of admission 07/27/2018 she had recurrent symptoms of abdominal pain with watery diarrhea and nausea with vomiting.  This time she was unable to maintain adequate oral intake or keep her home medications down, therefore she presented to the ED for further evaluation.  She denies any associated fevers, chest pain, dyspnea, cough.  She continues to make urine and denies any dysuria.  ED Course:  Initial vitals in the ED showed BP 193/140, pulse 100, RR 23, temp 97.6 Fahrenheit, SPO2 97% on room air.  Labs are notable for WBC 9.4, hemoglobin 11.0, platelets 177, potassium 4.4, serum glucose  492, BUN 20, creatinine 6.4, AST 17, ALT 14, alk phos 145, T bili 0.7 acid 1.4.  CT abdomen/pelvis without contrast was obtained which showed interval progression of inflammatory changes of the small bowel with involvement of more distal segments in the left hemi-abdomen.  Patient was given IV NS 500 mL, IV labetalol 5 mg once, Lopressor 25 mg p.o. once, IM Bentyl, IV Zofran and Reglan, and oral Augmentin.  Patient had continued nausea with vomiting and due to inability to maintain adequate oral intake or take home medications the hospital service was consulted to admit for further evaluation and management.  Review of Systems: As per HPI otherwise 10 point review of systems negative.    Past Medical History:  Diagnosis Date  . Chronic kidney disease   . Diabetes mellitus without complication ([place])   . History of pre-eclampsia 2016   mild  . Hypertension     Past Surgical History:  Procedure Laterality Date  . CESAREAN SECTION    . DILATION AND CURETTAGE OF UTERUS     x 4     reports that she has never smoked. She has never used smokeless tobacco. She reports that she does not drink alcohol or use drugs.  Allergies  Allergen Reactions  . Morphine     Other reaction(s): Hallucination  . Sulfamethoxazole-Trimethoprim Hives  . Trulicity [Dulaglutide]     Family History  Problem Relation Age of Onset  . Hypertension Mother   . Cancer Mother        Uterine vs cervical (pt unsure),   . Schizophrenia Brother   . Breast cancer Neg Hx      Prior to Admission medications   Medication  Sig Start Date End Date Taking? Authorizing Provider  calcium acetate (PHOSLO) 667 MG capsule Take 667 mg by mouth 3 (three) times daily.  11/26/17 11/26/18 Yes [provider]  clobetasol ointment (TEMOVATE) 6.19 % Apply 1 application topically 2 (two) times daily as needed for itching. 03/07/18  Yes [provider]  cyclobenzaprine (FLEXERIL) 10 MG tablet TAKE 1 TABLET BY MOUTH 3  TIMES A DAY FOR 10 DAYS Patient taking differently: Take 10 mg by mouth 3 (three) times daily as needed for muscle spasms.  09/08/17  Yes Rubie Maid, MD  Insulin Human (INSULIN PUMP) SOLN Inject into the skin continuous. Novolog   Yes [provider]  metoprolol succinate (TOPROL-XL) 25 MG 24 hr tablet Take 25 mg by mouth daily. 02/14/18  Yes [provider]  NIFEdipine (PROCARDIA-XL/ADALAT-CC/NIFEDICAL-XL) 30 MG 24 hr tablet Take 1 tablet (30 mg total) by mouth 2 (two) times daily. Patient taking differently: Take 60 mg by mouth 2 (two) times daily.  08/20/17  Yes Rubie Maid, MD  paricalcitol (ZEMPLAR) 1 MCG capsule Take 1 mcg by mouth daily.   Yes [provider]  amoxicillin-clavulanate (AUGMENTIN) 500-125 MG tablet Take 1 tablet (500 mg total) by mouth daily. 07/27/18   Domenic Moras, PA-C  dicyclomine (BENTYL) 20 MG tablet Take 1 tablet (20 mg total) by mouth 2 (two) times daily. 07/27/18   Domenic Moras, PA-C  NOVOLOG FLEXPEN 100 UNIT/ML FlexPen 0-9 units injected 3 times daily with meals according to the following scale: CBG 70-120 = 0 units CBG 121-150 = 1 unit CBG 151-200 = 2 units CBG 201-250 = 3 units CBG 251-300 = 5 units CBG 301-350 = 7 units CBG 351-400 = 9 units CBG >400 = call MD for advice Patient not taking: Reported on 07/27/2018 04/16/17   Cherene Altes, MD  ondansetron (ZOFRAN ODT) 4 MG disintegrating tablet Take 1 tablet (4 mg total) by mouth every 8 (eight) hours as needed for nausea or vomiting. 07/27/18   Domenic Moras, PA-C    Physical Exam: Vitals:   07/27/18 2044 07/27/18 2130 07/27/18 2136 07/27/18 2210  BP:  (!) 191/132 (!) 191/132 (!) 178/119  Pulse: (!) 101 (!) 103 (!) 110 (!) 104  Resp: (!) '24 17  18  ' Temp:    97.8 F (36.6 C)  TempSrc:    Oral  SpO2: 100% 99%  100%  Weight:      Height:        Constitutional: Resting supine in bed, calm, somewhat uncomfortable Eyes: PERRL, lids and conjunctivae normal ENMT: Mucous membranes  are moist. Posterior pharynx clear of any exudate or lesions.Normal dentition.  Neck: normal, supple, no masses. Respiratory: clear to auscultation bilaterally, no wheezing, no crackles. Normal respiratory effort. No accessory muscle use.  Cardiovascular: Regular rate and rhythm, no murmurs / rubs / gallops. No extremity edema.  HD catheter in place right chest wall. Abdomen: Tender to palpation in LLQ, LUQ, and epigastric regions. No hepatosplenomegaly. Bowel sounds diminished.  Musculoskeletal: no clubbing / cyanosis. No joint deformity upper and lower extremities. Good ROM, no contractures. Normal muscle tone.  Skin: Insulin pump in place  RUE. no rashes, lesions, ulcers. No induration Neurologic: CN 2-12 grossly intact. Sensation intact, Strength 5/5 in all 4.  Psychiatric: Normal judgment and insight. Alert and oriented x 3. Normal mood.     Labs on Admission: I have personally reviewed following labs and imaging studies  CBC: Recent Labs  Lab 07/27/18 1440  WBC 9.4  HGB  11.0*  HCT 34.3*  MCV 80.9  PLT 378   Basic Metabolic Panel: Recent Labs  Lab 07/27/18 1440  NA 132*  K 4.4  CL 99  CO2 23  GLUCOSE 492*  BUN 20  CREATININE 6.40*  CALCIUM 8.7*   GFR: Estimated Creatinine Clearance: 13.7 mL/min (A) (by C-G formula based on SCr of 6.4 mg/dL (H)). Liver Function Tests: Recent Labs  Lab 07/27/18 1440  AST 17  ALT 14  ALKPHOS 145*  BILITOT 0.7  PROT 6.1*  ALBUMIN 2.9*   Recent Labs  Lab 07/27/18 1440  LIPASE 29   No results for input(s): AMMONIA in the last 168 hours. Coagulation Profile: No results for input(s): INR, PROTIME in the last 168 hours. Cardiac Enzymes: No results for input(s): CKTOTAL, CKMB, CKMBINDEX, TROPONINI in the last 168 hours. BNP (last 3 results) No results for input(s): PROBNP in the last 8760 hours. HbA1C: No results for input(s): HGBA1C in the last 72 hours. CBG: Recent Labs  Lab 07/27/18 1435 07/27/18 1714  GLUCAP 440*  282*   Lipid Profile: No results for input(s): CHOL, HDL, LDLCALC, TRIG, CHOLHDL, LDLDIRECT in the last 72 hours. Thyroid Function Tests: No results for input(s): TSH, T4TOTAL, FREET4, T3FREE, THYROIDAB in the last 72 hours. Anemia Panel: No results for input(s): VITAMINB12, FOLATE, FERRITIN, TIBC, IRON, RETICCTPCT in the last 72 hours. Urine analysis:    Component Value Date/Time   COLORURINE YELLOW 07/10/2018 0554   APPEARANCEUR HAZY (A) 07/10/2018 0554   APPEARANCEUR Clear 06/25/2017 1042   LABSPEC 1.017 07/10/2018 0554   PHURINE 6.0 07/10/2018 0554   GLUCOSEU NEGATIVE 07/10/2018 0554   HGBUR SMALL (A) 07/10/2018 0554   BILIRUBINUR NEGATIVE 07/10/2018 0554   BILIRUBINUR neg 08/05/2017 1514   BILIRUBINUR Negative 06/25/2017 1042   KETONESUR NEGATIVE 07/10/2018 0554   PROTEINUR 100 (A) 07/10/2018 0554   UROBILINOGEN 0.2 08/05/2017 1514   NITRITE NEGATIVE 07/10/2018 0554   LEUKOCYTESUR NEGATIVE 07/10/2018 0554    Radiological Exams on Admission: Ct Abdomen Pelvis Wo Contrast  Result Date: 07/27/2018 CLINICAL DATA:  33 year old female with abdominal pain. Concern for diverticulitis. EXAM: CT ABDOMEN AND PELVIS WITHOUT CONTRAST TECHNIQUE: Multidetector CT imaging of the abdomen and pelvis was performed following the standard protocol without IV contrast. COMPARISON:  CT of the abdomen pelvis dated 07/10/2018 FINDINGS: Evaluation of this exam is limited in the absence of intravenous contrast. Lower chest: Partially visualized central venous line with tip at the cavoatrial junction. The visualized lung bases are clear. No intra-abdominal free air. There is a small amount of free fluid within the pelvis. Hepatobiliary: No focal liver abnormality is seen. No gallstones, gallbladder wall thickening, or biliary dilatation. Pancreas: Unremarkable. No pancreatic ductal dilatation or surrounding inflammatory changes. Spleen: Normal in size without focal abnormality. Adrenals/Urinary Tract: The  adrenal glands, kidneys, and visualized ureters appear unremarkable. There is apparent diffuse thickening of the bladder wall which may be partly related to underdistention. Cystitis is not excluded. Correlation with urinalysis recommended. Stomach/Bowel: There is persistent thickening of a segment of proximal jejunum in the left upper abdomen. There is inflammatory changes and mild dilatation of this segment of bowel. Additional thickened and inflamed segments of more distal small bowel. Findings may represent an inflammatory or infectious enteritis or related to underlying inflammatory bowel disease such as Crohn's or possibly intramural hematoma if the patient is on blood thinners. Small bowel lymphoma can have similar appearance but is favored less likely given rapid interval progression since the study of 07/10/2018. There  is no associated bowel obstruction. Appendicoliths noted within the appendix. The appendix is otherwise unremarkable without inflammatory changes. Vascular/Lymphatic: The abdominal aorta and IVC are grossly unremarkable on this noncontrast CT. No portal venous gas. There is no adenopathy. Reproductive: The uterus and ovaries are grossly unremarkable. No pelvic mass. 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 No acute osseous pathology. IMPRESSION: Overall interval progression of inflammatory changes of small bowel with involvement of more distal segments in the left hemiabdomen. Findings most likely represent an inflammatory or infectious enteritis. Other etiologies as described above are not excluded. Clinical correlation and follow-up recommended. No associated obstruction. Electronically Signed   By: Anner Crete M.D.   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   Enteritis: Patient with recurrent symptoms of nausea, vomiting, abdominal pain, diarrhea with worsening interval findings of small bowel involvement on repeat CT imaging.  Suspect symptoms may be related to eculizumab infusion versus infectious process.  Inflammatory bowel disease is also possibility given history of autoimmune disease.  Given continued symptoms and worsening changes on CT scanning, will add antibiotic therapy.  She has not had further episodes of diarrhea while in the ED. -N.p.o. except for sips with meds -IV antiemetics as needed -Start IV Unasyn, renally dosed -Give additional IV normal saline 500 mL, will hold further maintenance fluids due to ESRD -If having further episodes of frequent watery diarrhea, consider checking for C. difficile/GI pathogen panel  Type 1 diabetes: Patient uses an insulin pump as an outpatient.  Serum glucose was 492 on admission.  CBG 5 units NovoLog improved to 282. -Sensitive SSI every 4 hours while n.p.o. -Hypoglycemia protocol -Resume home insulin pump when eating  Hypertension: Uncontrolled on admission secondary to keep home medications down and pain. -Restart home metoprolol and nifedipine -IV hydralazine as needed  ESRD on TTS HD: Appears euvolemic to mildly dehydrated on exam.  No emergent need for dialysis. -Will need nephrology consult in a.m. for regular dialysis if remaining in hospital  Atypical hemolytic uremic syndrome: Follows with oncology, Dr. Rogue Bussing, and receiving eculizumab infusion treatments with last dose on 07/20/2018.  She is planned to switch to Ravulizumab next  treatment. -Follow-up with oncology outpatient  DVT prophylaxis: Subcutaneous heparin Code Status: Full code, confirmed with patient Family Communication: None present at bedside on admission Disposition Plan: Likely discharge to home pending clinical progress and ability maintain adequate oral intake Consults called: None Admission status: Observation   Zada Finders MD [facility] Pager [phone]  If 7PM-7AM, please contact night-coverage [email or web address]  07/27/2018, 11:35 PM

## 2018-07-27 NOTE — ED Notes (Signed)
Pt unable to take PO BP meds due to nausea.

## 2018-07-27 NOTE — ED Notes (Signed)
ED TO INPATIENT HANDOFF REPORT  ED Nurse Name and Phone #: Suezanne Jacquet [phone]  S Name/Age/Gender Kelly Huff 33 y.o. female Room/Bed: 017C/017C  Code Status   Code Status: Prior  Home/SNF/Other Home Patient oriented to: self, place, time and situation Is this baseline? Yes   Triage Complete: Triage complete  Chief Complaint abd pain  Triage Note Pt arrives via EMS from home with reports of abd pain, N/V/D. Pt was here 3/15 and discharged home with meds. Took meds for a week then symptoms happened again. Pt is HD pt, TThSat. 4 mg zofran given by EMS   Allergies Allergies  Allergen Reactions  . Morphine     Other reaction(s): Hallucination  . Sulfamethoxazole-Trimethoprim Hives  . Trulicity [Dulaglutide]     Level of Care/Admitting Diagnosis ED Disposition    ED Disposition Condition [facility] Area: [place] [[id number]]  Level of Care: Med-Surg [16]  I expect the patient will be discharged within 24 hours: Yes  LOW acuity---Tx typically complete <24 hrs---ACUTE conditions typically can be evaluated <24 hours---LABS likely to return to acceptable levels <24 hours---IS near functional baseline---EXPECTED to return to current living arrangement---NOT newly hypoxic: Meets criteria for 5C-Observation unit  Diagnosis: Enteritis [700174]  Admitting Physician: Lenore Cordia [[id number]]  Attending Physician: Lenore Cordia [[id number]]  PT Class (Do Not Modify): Observation [104]  PT Acc Code (Do Not Modify): Observation [10022]       B Medical/Surgery History Past Medical History:  Diagnosis Date  . Chronic kidney disease   . Diabetes mellitus without complication ([place])   . History of pre-eclampsia 2016   mild  . Hypertension    Past Surgical History:  Procedure Laterality Date  . CESAREAN SECTION    . DILATION AND CURETTAGE OF UTERUS     x 4     A IV Location/Drains/Wounds Patient Lines/Drains/Airways Status   Active  Line/Drains/Airways    Name:   Placement date:   Placement time:   Site:   Days:   Peripheral IV 07/27/18 Left Antecubital   07/27/18    1441    Antecubital   less than 1          Intake/Output Last 24 hours  Intake/Output Summary (Last 24 hours) at 07/27/2018 2258 Last data filed at 07/27/2018 1641 Gross per 24 hour  Intake 500 ml  Output -  Net 500 ml    Labs/Imaging Results for orders placed or performed during the hospital encounter of 07/27/18 (from the past 48 hour(s))  CBG monitoring, ED     Status: Abnormal   Collection Time: 07/27/18  2:35 PM  Result Value Ref Range   Glucose-Capillary 440 (H) 70 - 99 mg/dL   Comment 1 Notify RN    Comment 2 Document in Chart   Lipase, blood     Status: None   Collection Time: 07/27/18  2:40 PM  Result Value Ref Range   Lipase 29 11 - 51 U/L    Comment: Performed at [facility], [place] [address], [place],  [address]  Comprehensive metabolic panel     Status: Abnormal   Collection Time: 07/27/18  2:40 PM  Result Value Ref Range   Sodium 132 (L) 135 - 145 mmol/L   Potassium 4.4 3.5 - 5.1 mmol/L   Chloride 99 98 - 111 mmol/L   CO2 23 22 - 32 mmol/L   Glucose, Bld 492 (H) 70 - 99 mg/dL   BUN 20  6 - 20 mg/dL   Creatinine, Ser 6.40 (H) 0.44 - 1.00 mg/dL   Calcium 8.7 (L) 8.9 - 10.3 mg/dL   Total Protein 6.1 (L) 6.5 - 8.1 g/dL   Albumin 2.9 (L) 3.5 - 5.0 g/dL   AST 17 15 - 41 U/L   ALT 14 0 - 44 U/L   Alkaline Phosphatase 145 (H) 38 - 126 U/L   Total Bilirubin 0.7 0.3 - 1.2 mg/dL   GFR calc non Af Amer 8 (L) >60 mL/min   GFR calc Af Amer 9 (L) >60 mL/min   Anion gap 10 5 - 15    Comment: Performed at [facility] [address], [place], [place] [address]  CBC     Status: Abnormal   Collection Time: 07/27/18  2:40 PM  Result Value Ref Range   WBC 9.4 4.0 - 10.5 K/uL   RBC 4.24 3.87 - 5.11 MIL/uL   Hemoglobin 11.0 (L) 12.0 - 15.0 g/dL   HCT 34.3 (L) 36.0 - 46.0 %   MCV 80.9 80.0 - 100.0 fL   MCH 25.9 (L)  26.0 - 34.0 pg   MCHC 32.1 30.0 - 36.0 g/dL   RDW 18.5 (H) 11.5 - 15.5 %   Platelets 177 150 - 400 K/uL   nRBC 0.0 0.0 - 0.2 %    Comment: Performed at [facility], [place] [address], [place], [place] [address]  Lactic acid, plasma     Status: None   Collection Time: 07/27/18  3:47 PM  Result Value Ref Range   Lactic Acid, Venous 1.4 0.5 - 1.9 mmol/L    Comment: Performed at [facility] [address] [place],  [address]  CBG monitoring, ED     Status: Abnormal   Collection Time: 07/27/18  5:14 PM  Result Value Ref Range   Glucose-Capillary 282 (H) 70 - 99 mg/dL   Comment 1 Notify RN    Comment 2 Document in Chart    Ct Abdomen Pelvis Wo Contrast  Result Date: 07/27/2018 CLINICAL DATA:  33 year old female with abdominal pain. Concern for diverticulitis. EXAM: CT ABDOMEN AND PELVIS WITHOUT CONTRAST TECHNIQUE: Multidetector CT imaging of the abdomen and pelvis was performed following the standard protocol without IV contrast. COMPARISON:  CT of the abdomen pelvis dated 07/10/2018 FINDINGS: Evaluation of this exam is limited in the absence of intravenous contrast. Lower chest: Partially visualized central venous line with tip at the cavoatrial junction. The visualized lung bases are clear. No intra-abdominal free air. There is a small amount of free fluid within the pelvis. Hepatobiliary: No focal liver abnormality is seen. No gallstones, gallbladder wall thickening, or biliary dilatation. Pancreas: Unremarkable. No pancreatic ductal dilatation or surrounding inflammatory changes. Spleen: Normal in size without focal abnormality. Adrenals/Urinary Tract: The adrenal glands, kidneys, and visualized ureters appear unremarkable. There is apparent diffuse thickening of the bladder wall which may be partly related to underdistention. Cystitis is not excluded. Correlation with urinalysis recommended. Stomach/Bowel: There is persistent thickening of a segment of proximal jejunum in the  left upper abdomen. There is inflammatory changes and mild dilatation of this segment of bowel. Additional thickened and inflamed segments of more distal small bowel. Findings may represent an inflammatory or infectious enteritis or related to underlying inflammatory bowel disease such as Crohn's or possibly intramural hematoma if the patient is on blood thinners. Small bowel lymphoma can have similar appearance but is favored less likely given rapid interval progression since the study  of 07/10/2018. There is no associated bowel obstruction. Appendicoliths noted within the appendix. The appendix is otherwise unremarkable without inflammatory changes. Vascular/Lymphatic: The abdominal aorta and IVC are grossly unremarkable on this noncontrast CT. No portal venous gas. There is no adenopathy. Reproductive: The uterus and ovaries are grossly unremarkable. No pelvic mass. 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 No acute osseous pathology. IMPRESSION: Overall interval progression of inflammatory changes of small bowel with involvement of more distal segments in the left hemiabdomen. Findings most likely represent an inflammatory or infectious enteritis. Other etiologies as described above are not excluded. Clinical correlation and follow-up recommended. No associated obstruction. Electronically Signed   By: Anner Crete M.D.   On: 07/27/2018 19:19    Pending Labs Unresulted Labs (From admission, onward)    Start     Ordered   07/27/18 1437  Urinalysis, Routine w reflex microscopic  ONCE - STAT,   STAT     07/27/18 1436          Vitals/Pain Today's Vitals   07/27/18 2130 07/27/18 2132 07/27/18 2136 07/27/18 2210  BP: (!) 191/132  (!) 191/132 (!) 178/119  Pulse: (!) 103  (!) 110 (!) 104  Resp: 17   18  Temp:    97.8 F (36.6 C)  TempSrc:    Oral  SpO2: 99%   100%  Weight:      Height:      PainSc:  Asleep  2      Isolation Precautions No active isolations  Medications Medications  dicyclomine (BENTYL) injection 20 mg (20 mg Intramuscular Given 07/27/18 1540)  labetalol (NORMODYNE,TRANDATE) injection 5 mg (5 mg Intravenous Given 07/27/18 1539)  ondansetron (ZOFRAN) injection 4 mg (4 mg Intravenous Given 07/27/18 1540)  fentaNYL (SUBLIMAZE) injection 50 mcg (50 mcg Intravenous Given 07/27/18 1539)  insulin aspart (novoLOG) injection 5 Units (5 Units Subcutaneous Given 07/27/18 1552)  sodium chloride 0.9 % bolus 500 mL (0 mLs Intravenous Stopped 07/27/18 1641)  metoprolol tartrate (LOPRESSOR) tablet 25 mg (25 mg Oral Given 07/27/18 2136)  fentaNYL (SUBLIMAZE) injection 50 mcg (50 mcg Intravenous Given 07/27/18 1808)  metoCLOPramide (REGLAN) injection 10 mg (10 mg Intravenous Given 07/27/18 1808)  amoxicillin-clavulanate (AUGMENTIN) 500-125 MG per tablet 500 mg (500 mg Oral Given 07/27/18 2137)  fentaNYL (SUBLIMAZE) injection 50 mcg (50 mcg Intravenous Given 07/27/18 2044)    Mobility walks Low fall risk   Focused Assessments GI   R Recommendations: See Admitting Provider Note  Report given to:   Additional Notes:  Pt failed PO challenge. Not able to keep fluids / medications down.

## 2018-07-27 NOTE — ED Provider Notes (Signed)
Patient seen/examined in the Emergency Department in conjunction with Midlevel Provider Rona Ravens Patient reports diffuse abdominal pain with vomiting and diarrhea.  She reports this is similar to episode last month. Exam : Alert, appears uncomfortable.  Mild diffuse tenderness.  Abdomen is soft.  Bowel sounds noted throughout abdomen. Plan: Plan is to treat pain and reassess.  Patient had CT scan on March 15 that revealed probable enteritis. Reports to me that is similar to that episode.   Ripley Fraise, MD 07/27/18 [phone]

## 2018-07-27 NOTE — ED Triage Notes (Signed)
Pt arrives via EMS from home with reports of abd pain, N/V/D. Pt was here 3/15 and discharged home with meds. Took meds for a week then symptoms happened again. Pt is HD pt, TThSat. 4 mg zofran given by EMS

## 2018-07-27 NOTE — Discharge Instructions (Signed)
You have been evaluated for your abdominal pain.  Your enteritis has worsen from prior.  Please take antibiotic as prescribed, take zofran as needed for nausea and take dicyclomine for pain.  Call and follow up with gastroenterologist promptly for further evaluation as your symptoms may be due to inflammatory bowel disease.  Return promptly if your condition worsen or if you have other concerns.  Your blood pressure is high today, please take your medication and have it recheck.

## 2018-07-27 NOTE — ED Provider Notes (Addendum)
[facility] EMERGENCY DEPARTMENT Provider Note   CSN: [id number] Arrival date & time: 07/27/18  1425    History   Chief Complaint Chief Complaint  Patient presents with  . Abdominal Pain    HPI Kelly Huff is a 33 y.o. female.     The history is provided by the patient and medical records. No language interpreter was used.  Abdominal Pain     33 year old female with history of insulin-dependent diabetes, end-stage renal disease on T-Th-Sat HD, uncontrolled hypertension brought here via EMS from home for evaluation of abdominal pain.  Patient reports several weeks ago she developed diffuse stabbing abdominal pain.  She was seen in the ED and a CT scan shows evidence of enteritis.  Patient discharged home with Bentyl.  She mentioned medication did help and she was symptom-free until today has symptoms return.  States she is having diffuse moderate stabbing abdominal pain with persistent nausea vomiting and diarrhea.  Vomit and diarrhea has been more than 10 episodes of nonbloody nonbilious content.  Symptoms felt similar to her recent enteritis.  Does not complain of any fever no URI symptom.  She denies any recent travel or recent sick contact.  Her last dialysis session was yesterday.  She voiced concern that her blood pressure is high and her blood sugar is high because she is unable to keep any of her medication down.  She denies any recent antibiotic use.  Past Medical History:  Diagnosis Date  . Chronic kidney disease   . Diabetes mellitus without complication ([place])   . History of pre-eclampsia 2016   mild  . Hypertension     Patient Active Problem List   Diagnosis Date Noted  . 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 Elevated troponin 08/14/2017  . CKD (chronic kidney disease) stage 3, GFR 30-59 ml/min ([facility]) 08/14/2017  . Chronic hypertension affecting pregnancy 08/13/2017  . Labor and delivery,  indication for care 08/12/2017  . 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 Iron deficiency anemia during pregnancy 07/06/2017  . History of pre-eclampsia in prior pregnancy, currently pregnant in first trimester 06/12/2017  . Diabetes ([place]) 04/13/2017  . Acute renal failure ([place])   . Dehydration   . Other nonspecific abnormal finding 01/29/2016  . Family history of BRCA gene positive 07/13/2013  . 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 CESAREAN SECTION    . DILATION AND CURETTAGE OF UTERUS     x 4     OB History    Gravida  5   Para  2   Term  2   Preterm      AB  2   Living  2     SAB      TAB  0   Ectopic      Multiple      Live Births  2            Home Medications    Prior to Admission medications   Medication Sig Start Date End Date Taking? Authorizing Provider  calcium acetate (PHOSLO) 667 MG capsule Take 1 capsule by mouth 3 (three) times daily. 11/26/17 11/26/18  [provider]  clobetasol ointment (TEMOVATE) 7.01 % Apply 1 application topically 2 (two) times daily as needed for itching. 03/07/18   [provider]  cyclobenzaprine (FLEXERIL) 10 MG tablet TAKE 1  TABLET BY MOUTH 3 TIMES A DAY FOR 10 DAYS Patient taking differently: Take 10 mg by mouth 3 (three) times daily as needed for muscle spasms.  09/08/17   Rubie Maid, MD  dicyclomine (BENTYL) 20 MG tablet Take 1 tablet (20 mg total) by mouth 2 (two) times daily. 07/10/18   Maudie Flakes, MD  metoprolol succinate (TOPROL-XL) 25 MG 24 hr tablet Take 25 mg by mouth daily. 02/14/18   [provider]  NIFEdipine (PROCARDIA-XL/ADALAT-CC/NIFEDICAL-XL) 30 MG 24 hr tablet Take 1 tablet (30 mg total) by mouth 2 (two) times daily. Patient taking differently: Take 60 mg by mouth 2 (two) times daily.  08/20/17   Rubie Maid, MD  NOVOLOG FLEXPEN 100 UNIT/ML FlexPen 0-9  units injected 3 times daily with meals according to the following scale: CBG 70-120 = 0 units CBG 121-150 = 1 unit CBG 151-200 = 2 units CBG 201-250 = 3 units CBG 251-300 = 5 units CBG 301-350 = 7 units CBG 351-400 = 9 units CBG >400 = call MD for advice 04/16/17   Cherene Altes, MD  ondansetron (ZOFRAN ODT) 4 MG disintegrating tablet Take 1 tablet (4 mg total) by mouth every 8 (eight) hours as needed for nausea or vomiting. 07/10/18   Maudie Flakes, MD  paricalcitol (ZEMPLAR) 1 MCG capsule Take 1 mcg by mouth daily.    [provider]    Family History Family History  Problem Relation Age of Onset  . Hypertension Mother   . Cancer Mother        Uterine vs cervical (pt unsure),   . Schizophrenia Brother   . Breast cancer Neg Hx     Social History Social History   Tobacco Use  . Smoking status: Never Smoker  . Smokeless tobacco: Never Used  Substance Use Topics  . Alcohol use: No  . Drug use: No     Allergies   Morphine; Sulfamethoxazole-trimethoprim; and Trulicity [dulaglutide]   Review of Systems Review of Systems  Gastrointestinal: Positive for abdominal pain.  All other systems reviewed and are negative.    Physical Exam Updated Vital Signs BP (!) 193/140   Pulse (!) 107   Resp (!) 23   Ht '5\' 7"'  (1.702 m)   Wt 83.1 kg   SpO2 97%   BMI 28.69 kg/m   Physical Exam Vitals signs and nursing note reviewed.  Constitutional:      General: She is not in acute distress.    Appearance: She is well-developed.     Comments: Patient appears uncomfortable.  HENT:     Head: Atraumatic.  Eyes:     Conjunctiva/sclera: Conjunctivae normal.  Neck:     Musculoskeletal: Neck supple.  Cardiovascular:     Rate and Rhythm: Tachycardia present.     Heart sounds: Normal heart sounds.  Pulmonary:     Effort: Pulmonary effort is normal.     Breath sounds: Normal breath sounds.  Abdominal:     General: Abdomen is flat. Bowel sounds are decreased.      Palpations: Abdomen is soft.     Tenderness: There is generalized abdominal tenderness and tenderness in the left lower quadrant.     Hernia: No hernia is present.  Skin:    Findings: No rash.  Neurological:     Mental Status: She is alert.      ED Treatments / Results  Labs (all labs ordered are listed, but only abnormal results are displayed) Labs Reviewed  COMPREHENSIVE METABOLIC PANEL -  Abnormal; Notable for the following components:      Result Value   Sodium 132 (*)    Glucose, Bld 492 (*)    Creatinine, Ser 6.40 (*)    Calcium 8.7 (*)    Total Protein 6.1 (*)    Albumin 2.9 (*)    Alkaline Phosphatase 145 (*)    GFR calc non Af Amer 8 (*)    GFR calc Af Amer 9 (*)    All other components within normal limits  CBC - Abnormal; Notable for the following components:   Hemoglobin 11.0 (*)    HCT 34.3 (*)    MCH 25.9 (*)    RDW 18.5 (*)    All other components within normal limits  CBG MONITORING, ED - Abnormal; Notable for the following components:   Glucose-Capillary 440 (*)    All other components within normal limits  CBG MONITORING, ED - Abnormal; Notable for the following components:   Glucose-Capillary 282 (*)    All other components within normal limits  LIPASE, BLOOD  LACTIC ACID, PLASMA  URINALYSIS, ROUTINE W REFLEX MICROSCOPIC  I-STAT BETA HCG BLOOD, ED ([facility], [facility], [facility] ONLY)    EKG None  Radiology Ct Abdomen Pelvis Wo Contrast  Result Date: 07/27/2018 CLINICAL DATA:  33 year old female with abdominal pain. Concern for diverticulitis. EXAM: CT ABDOMEN AND PELVIS WITHOUT CONTRAST TECHNIQUE: Multidetector CT imaging of the abdomen and pelvis was performed following the standard protocol without IV contrast. COMPARISON:  CT of the abdomen pelvis dated 07/10/2018 FINDINGS: Evaluation of this exam is limited in the absence of intravenous contrast. Lower chest: Partially visualized central venous line with tip at the cavoatrial junction. The visualized lung bases  are clear. No intra-abdominal free air. There is a small amount of free fluid within the pelvis. Hepatobiliary: No focal liver abnormality is seen. No gallstones, gallbladder wall thickening, or biliary dilatation. Pancreas: Unremarkable. No pancreatic ductal dilatation or surrounding inflammatory changes. Spleen: Normal in size without focal abnormality. Adrenals/Urinary Tract: The adrenal glands, kidneys, and visualized ureters appear unremarkable. There is apparent diffuse thickening of the bladder wall which may be partly related to underdistention. Cystitis is not excluded. Correlation with urinalysis recommended. Stomach/Bowel: There is persistent thickening of a segment of proximal jejunum in the left upper abdomen. There is inflammatory changes and mild dilatation of this segment of bowel. Additional thickened and inflamed segments of more distal small bowel. Findings may represent an inflammatory or infectious enteritis or related to underlying inflammatory bowel disease such as Crohn's or possibly intramural hematoma if the patient is on blood thinners. Small bowel lymphoma can have similar appearance but is favored less likely given rapid interval progression since the study of 07/10/2018. There is no associated bowel obstruction. Appendicoliths noted within the appendix. The appendix is otherwise unremarkable without inflammatory changes. Vascular/Lymphatic: The abdominal aorta and IVC are grossly unremarkable on this noncontrast CT. No portal venous gas. There is no adenopathy. Reproductive: The uterus and ovaries are grossly unremarkable. No pelvic mass. 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 No acute osseous pathology. IMPRESSION: Overall interval progression of inflammatory changes of small bowel with involvement of more distal segments in the left hemiabdomen. Findings most likely represent an inflammatory or  infectious enteritis. Other etiologies as described above are not excluded. Clinical correlation and follow-up recommended. No associated obstruction. Electronically Signed   By: Anner Crete M.D.   On: 07/27/2018 19:19    Procedures .  Critical Care Performed by: Domenic Moras, PA-C Authorized by: Domenic Moras, PA-C   Critical care provider statement:    Critical care time (minutes):  45   Critical care was time spent personally by me on the following activities:  Discussions with consultants, evaluation of patient's response to treatment, examination of patient, ordering and performing treatments and interventions, ordering and review of laboratory studies, ordering and review of radiographic studies, pulse oximetry, re-evaluation of patient's condition, obtaining history from patient or surrogate and review of old charts   (including critical care time)  Medications Ordered in ED Medications  dicyclomine (BENTYL) injection 20 mg (20 mg Intramuscular Given 07/27/18 1540)  labetalol (NORMODYNE,TRANDATE) injection 5 mg (5 mg Intravenous Given 07/27/18 1539)  ondansetron (ZOFRAN) injection 4 mg (4 mg Intravenous Given 07/27/18 1540)  fentaNYL (SUBLIMAZE) injection 50 mcg (50 mcg Intravenous Given 07/27/18 1539)  insulin aspart (novoLOG) injection 5 Units (5 Units Subcutaneous Given 07/27/18 1552)  sodium chloride 0.9 % bolus 500 mL (0 mLs Intravenous Stopped 07/27/18 1641)  metoprolol tartrate (LOPRESSOR) tablet 25 mg (25 mg Oral Given 07/27/18 2136)  fentaNYL (SUBLIMAZE) injection 50 mcg (50 mcg Intravenous Given 07/27/18 1808)  metoCLOPramide (REGLAN) injection 10 mg (10 mg Intravenous Given 07/27/18 1808)  amoxicillin-clavulanate (AUGMENTIN) 500-125 MG per tablet 500 mg (500 mg Oral Given 07/27/18 2137)  fentaNYL (SUBLIMAZE) injection 50 mcg (50 mcg Intravenous Given 07/27/18 2044)     Initial Impression / Assessment and Plan / ED Course  I have reviewed the triage vital signs and the nursing notes.   Pertinent labs & imaging results that were available during my care of the patient were reviewed by me and considered in my medical decision making (see chart for details).        BP (!) 178/119 (BP Location: Right Arm)   Pulse (!) 104   Temp 97.8 F (36.6 C) (Oral)   Resp 18   Ht '5\' 7"'  (1.702 m)   Wt 83.1 kg   SpO2 100%   BMI 28.69 kg/m    Final Clinical Impressions(s) / ED Diagnoses   Final diagnoses:  Enteritis  Hypertensive urgency  Hyperglycemia    ED Discharge Orders         Ordered    dicyclomine (BENTYL) 20 MG tablet  2 times daily     07/27/18 2206    ondansetron (ZOFRAN ODT) 4 MG disintegrating tablet  Every 8 hours PRN     07/27/18 2206    amoxicillin-clavulanate (AUGMENTIN) 500-125 MG tablet  Daily     07/27/18 2206         3:25 PM Patient here with abdominal pain and associate nausea vomiting diarrhea.  Symptoms similar to abdominal pain she had several weeks prior when she had a CT scan that showed evidence of enteritis.  She is unable to keep any of her medication down therefore she is currently hypertensive with blood pressure 193/140, is tachycardic, and also hyperglycemic with a CBG 492 but normal anion gap.  She has normal lipase and normal WBC.  4:23 PM Lactic acid normal at 1.4.  Patient is afebrile.  On reexamination, her pain has improved greatly after initial treatment.  Currently awaits urinalysis.  Patient received a small bolus of IV fluid of 500cc  8:33 PM Pain has reeturned.  CT scan showed overall interval progressions of inflammatory changes of the small bowel involvement of more distal segment of the left hemiabdomen.  Finding most likely represents an inflammatory or infectious enteritis.  Patient  does have history of type 1 diabetes which make me suspicious of autoimmune disease such as inflammatory bowel disease like Crohn's causing the symptom.  However, due to potential infectious etiology as well, will start patient on antibiotic.  I  have discussed with pharmacist who recommend Augmentin 500-147m once daily for 10 days.  Will provide first dose here, if patient able to tolerate p.o. will discharge.  Patient will need to follow-up closely with gastroenterologist for further evaluation.  10:03 PM Unfortunately pt vomited up her medication and unable to tolerates PO.  Will consult for admission.   10:36 PM Appreciate consulation from [facility] Dr. PPosey Prontowho agrees to see and admit pt for further management of her condition.    TDomenic Moras PA-C 07/27/18 2244    WRipley Fraise MD 07/27/18 2247  ADDENDUM: CC time added   TDomenic Moras PA-C 08/17/18 1916    WRipley Fraise MD 08/19/18 1544

## 2018-07-27 NOTE — ED Notes (Signed)
Attempted report 

## 2018-07-28 ENCOUNTER — Encounter (HOSPITAL_COMMUNITY): Payer: Self-pay

## 2018-07-28 DIAGNOSIS — Z8249 Family history of ischemic heart disease and other diseases of the circulatory system: Secondary | ICD-10-CM | POA: Diagnosis not present

## 2018-07-28 DIAGNOSIS — E1022 Type 1 diabetes mellitus with diabetic chronic kidney disease: Secondary | ICD-10-CM | POA: Diagnosis present

## 2018-07-28 DIAGNOSIS — Z794 Long term (current) use of insulin: Secondary | ICD-10-CM | POA: Diagnosis not present

## 2018-07-28 DIAGNOSIS — Z809 Family history of malignant neoplasm, unspecified: Secondary | ICD-10-CM | POA: Diagnosis not present

## 2018-07-28 DIAGNOSIS — Z885 Allergy status to narcotic agent status: Secondary | ICD-10-CM | POA: Diagnosis not present

## 2018-07-28 DIAGNOSIS — E103299 Type 1 diabetes mellitus with mild nonproliferative diabetic retinopathy without macular edema, unspecified eye: Secondary | ICD-10-CM | POA: Diagnosis present

## 2018-07-28 DIAGNOSIS — I120 Hypertensive chronic kidney disease with stage 5 chronic kidney disease or end stage renal disease: Secondary | ICD-10-CM | POA: Diagnosis present

## 2018-07-28 DIAGNOSIS — Z818 Family history of other mental and behavioral disorders: Secondary | ICD-10-CM | POA: Diagnosis not present

## 2018-07-28 DIAGNOSIS — Z888 Allergy status to other drugs, medicaments and biological substances status: Secondary | ICD-10-CM | POA: Diagnosis not present

## 2018-07-28 DIAGNOSIS — E860 Dehydration: Secondary | ICD-10-CM | POA: Diagnosis present

## 2018-07-28 DIAGNOSIS — N2581 Secondary hyperparathyroidism of renal origin: Secondary | ICD-10-CM | POA: Diagnosis present

## 2018-07-28 DIAGNOSIS — E1069 Type 1 diabetes mellitus with other specified complication: Secondary | ICD-10-CM | POA: Diagnosis present

## 2018-07-28 DIAGNOSIS — D593 Hemolytic-uremic syndrome: Secondary | ICD-10-CM | POA: Diagnosis present

## 2018-07-28 DIAGNOSIS — Z9641 Presence of insulin pump (external) (internal): Secondary | ICD-10-CM | POA: Diagnosis present

## 2018-07-28 DIAGNOSIS — Z992 Dependence on renal dialysis: Secondary | ICD-10-CM | POA: Diagnosis not present

## 2018-07-28 DIAGNOSIS — E1065 Type 1 diabetes mellitus with hyperglycemia: Secondary | ICD-10-CM | POA: Diagnosis present

## 2018-07-28 DIAGNOSIS — I160 Hypertensive urgency: Secondary | ICD-10-CM | POA: Diagnosis present

## 2018-07-28 DIAGNOSIS — N186 End stage renal disease: Secondary | ICD-10-CM

## 2018-07-28 DIAGNOSIS — A09 Infectious gastroenteritis and colitis, unspecified: Secondary | ICD-10-CM | POA: Diagnosis present

## 2018-07-28 DIAGNOSIS — M898X9 Other specified disorders of bone, unspecified site: Secondary | ICD-10-CM | POA: Diagnosis present

## 2018-07-28 DIAGNOSIS — K529 Noninfective gastroenteritis and colitis, unspecified: Secondary | ICD-10-CM | POA: Diagnosis present

## 2018-07-28 DIAGNOSIS — Z882 Allergy status to sulfonamides status: Secondary | ICD-10-CM | POA: Diagnosis not present

## 2018-07-28 LAB — GLUCOSE, CAPILLARY
Glucose-Capillary: 171 mg/dL — ABNORMAL HIGH (ref 70–99)
Glucose-Capillary: 177 mg/dL — ABNORMAL HIGH (ref 70–99)
Glucose-Capillary: 212 mg/dL — ABNORMAL HIGH (ref 70–99)
Glucose-Capillary: 201 mg/dL — ABNORMAL HIGH (ref 70–99)
Glucose-Capillary: 117 mg/dL — ABNORMAL HIGH (ref 70–99)
Glucose-Capillary: 127 mg/dL — ABNORMAL HIGH (ref 70–99)
Glucose-Capillary: 129 mg/dL — ABNORMAL HIGH (ref 70–99)

## 2018-07-28 LAB — RENAL FUNCTION PANEL
Albumin: 2.5 g/dL — ABNORMAL LOW (ref 3.5–5.0)
Anion gap: 7 (ref 5–15)
BUN: 29 mg/dL — ABNORMAL HIGH (ref 6–20)
CO2: 24 mmol/L (ref 22–32)
Calcium: 8.4 mg/dL — ABNORMAL LOW (ref 8.9–10.3)
Chloride: 104 mmol/L (ref 98–111)
Creatinine, Ser: 7.65 mg/dL — ABNORMAL HIGH (ref 0.44–1.00)
GFR calc Af Amer: 7 mL/min — ABNORMAL LOW (ref >60)
GFR calc non Af Amer: 6 mL/min — ABNORMAL LOW (ref >60)
Glucose, Bld: 170 mg/dL — ABNORMAL HIGH (ref 70–99)
Phosphorus: 4.7 mg/dL — ABNORMAL HIGH (ref 2.5–4.6)
Potassium: 4.2 mmol/L (ref 3.5–5.1)
Sodium: 135 mmol/L (ref 135–145)

## 2018-07-28 LAB — URINALYSIS, ROUTINE W REFLEX MICROSCOPIC
Bilirubin Urine: NEGATIVE
Glucose, UA: 150 mg/dL — AB
Ketones, ur: NEGATIVE mg/dL
Leukocytes,Ua: NEGATIVE
Nitrite: NEGATIVE
Protein, ur: 100 mg/dL — AB
Specific Gravity, Urine: 1.024 (ref 1.005–1.030)
pH: 5 (ref 5.0–8.0)

## 2018-07-28 LAB — CBC
HCT: 31.5 % — ABNORMAL LOW (ref 36.0–46.0)
Hemoglobin: 9.9 g/dL — ABNORMAL LOW (ref 12.0–15.0)
MCH: 25.6 pg — ABNORMAL LOW (ref 26.0–34.0)
MCHC: 31.4 g/dL (ref 30.0–36.0)
MCV: 81.4 fL (ref 80.0–100.0)
Platelets: 162 10*3/uL (ref 150–400)
RBC: 3.87 MIL/uL (ref 3.87–5.11)
RDW: 18.8 % — ABNORMAL HIGH (ref 11.5–15.5)
WBC: 8.7 10*3/uL (ref 4.0–10.5)
nRBC: 0 % (ref 0.0–0.2)

## 2018-07-28 LAB — MRSA PCR SCREENING: MRSA by PCR: NEGATIVE

## 2018-07-28 MED ORDER — INSULIN PUMP: Freq: Three times a day (TID) | SUBCUTANEOUS | Status: DC

## 2018-07-28 MED ORDER — DOXERCALCIFEROL 4 MCG/2ML IV SOLN
INTRAVENOUS | Status: AC
  Administered 2018-07-28: 3 ug via INTRAVENOUS
  Filled 2018-07-28: qty 2

## 2018-07-28 MED ORDER — DARBEPOETIN ALFA 100 MCG/0.5ML IJ SOSY
PREFILLED_SYRINGE | INTRAMUSCULAR | Status: AC
  Administered 2018-07-28: 100 ug via INTRAVENOUS
  Filled 2018-07-28: qty 0.5

## 2018-07-28 MED ORDER — DOXERCALCIFEROL 4 MCG/2ML IV SOLN
3.0000 ug | INTRAVENOUS | Status: DC
  Administered 2018-07-28: 15:00:00 3 ug via INTRAVENOUS

## 2018-07-28 MED ORDER — SODIUM CHLORIDE 0.9 % IV SOLN
INTRAVENOUS | Status: DC | PRN
  Administered 2018-07-28: 1000 mL via INTRAVENOUS

## 2018-07-28 MED ORDER — SODIUM CHLORIDE 0.9 % IV SOLN
1.5000 g | Freq: Two times a day (BID) | INTRAVENOUS | Status: DC
  Administered 2018-07-28 (×2): 1.5 g via INTRAVENOUS
  Filled 2018-07-28 (×6): qty 1.5

## 2018-07-28 MED ORDER — DIPHENHYDRAMINE HCL 50 MG/ML IJ SOLN
12.5000 mg | Freq: Three times a day (TID) | INTRAMUSCULAR | Status: DC | PRN
  Administered 2018-07-28: 12.5 mg via INTRAVENOUS
  Filled 2018-07-28: qty 1

## 2018-07-28 MED ORDER — OXYCODONE HCL 5 MG PO TABS
ORAL_TABLET | ORAL | Status: AC
  Administered 2018-07-28: 5 mg via ORAL
  Filled 2018-07-28: qty 1

## 2018-07-28 MED ORDER — SODIUM CHLORIDE 0.9 % IV SOLN
125.0000 mg | INTRAVENOUS | Status: DC
  Administered 2018-07-28: 125 mg via INTRAVENOUS
  Filled 2018-07-28 (×2): qty 10

## 2018-07-28 MED ORDER — RENA-VITE PO TABS
1.0000 | ORAL_TABLET | Freq: Every day | ORAL | Status: DC
  Administered 2018-07-28: 1 via ORAL
  Filled 2018-07-28: qty 1

## 2018-07-28 MED ORDER — CHLORHEXIDINE GLUCONATE CLOTH 2 % EX PADS
6.0000 | MEDICATED_PAD | Freq: Every day | CUTANEOUS | Status: DC
  Administered 2018-07-28 – 2018-07-29 (×2): 6 via TOPICAL

## 2018-07-28 MED ORDER — DARBEPOETIN ALFA 100 MCG/0.5ML IJ SOSY
100.0000 ug | PREFILLED_SYRINGE | INTRAMUSCULAR | Status: DC
  Administered 2018-07-28: 15:00:00 100 ug via INTRAVENOUS
  Filled 2018-07-28: qty 0.5

## 2018-07-28 NOTE — Progress Notes (Signed)
Progress Note    Kelly Huff  QZE:[id number] DOB: [birth year]  DOA: 07/27/2018 PCP: Rubie Maid, MD    Brief Narrative:     Medical records reviewed and are as summarized below:  Kelly Huff is an 33 y.o. 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  Suspect symptoms may be related to eculizumab infusion versus infectious process.  Inflammatory bowel disease is also possibility given history of autoimmune disease.   IV abx as she did not tolerate PO abx in ER -IV antiemetics as needed -If having further episodes of frequent watery diarrhea, consider checking for C. difficile/GI pathogen panel -clear diet for now -may need GI consult-- will hold and monitor for improvement for now  Type 1 diabetes: Patient uses an insulin pump as an outpatient. -resume pump as she needs insulin for her type 1 DM -diabetic coordinator consult  Hypertension: Uncontrolled on admission secondary to keep home medications down and pain. -Restart home metoprolol and nifedipine -IV hydralazine as needed  ESRD on TTS HD: -HD due today, renal consulted  Atypical hemolytic uremic syndrome: Follows with oncology, Dr. Rogue Bussing, and receiving eculizumab infusion treatments with last dose on 07/20/2018.  She is planned to switch to Ravulizumab  next treatment. -Follow-up with oncology outpatient   Family Communication/Anticipated D/C date and plan/Code Status   DVT prophylaxis: heparin Code Status: Full Code.  Family Communication:  Disposition Plan: will change to inpatient as will be here on IV therapies > 2 midnights   Medical Consultants:    renal    Subjective:   Change to clear diet and monitor symptoms  Objective:    Vitals:   07/27/18 2210 07/27/18 2340 07/28/18 0459 07/28/18 0932  BP: (!) 178/119 (!) 145/113 139/88 (!) 171/111  Pulse: (!) 104 (!) 111 97 99  Resp: 18 16 16 18   Temp: 97.8 F (36.6 C) 98.4 F (36.9 C) 98.4 F (36.9 C) 98.5 F (36.9 C)  TempSrc: Oral Oral Oral Oral  SpO2: 100% 100% 100% 99%  Weight:      Height:        Intake/Output Summary (Last 24 hours) at 07/28/2018 0939 Last data filed at 07/28/2018 0555 Gross per 24 hour  Intake 913.65 ml  Output 25 ml  Net 888.65 ml   Filed Weights   07/27/18 1431  Weight: 83.1 kg    Exam: In bed, wearing mask +BS- increased from normal No LE Edema A+Ox3  Data Reviewed:   I have personally reviewed following labs and imaging studies:  Labs: Labs show the following:   Basic Metabolic Panel: Recent Labs  Lab 07/27/18 1440 07/28/18 0519  NA 132* 135  K 4.4 4.2  CL 99 104  CO2 23 24  GLUCOSE 492* 170*  BUN 20 29*  CREATININE 6.40* 7.65*  CALCIUM 8.7* 8.4*  PHOS  --  4.7*   GFR Estimated Creatinine Clearance: 11.5 mL/min (A) (by C-G  formula based on SCr of 7.65 mg/dL (H)). Liver Function Tests: Recent Labs  Lab 07/27/18 1440 07/28/18 0519  AST 17  --   ALT 14  --   ALKPHOS 145*  --   BILITOT 0.7  --   PROT 6.1*  --   ALBUMIN 2.9* 2.5*   Recent Labs  Lab 07/27/18 1440  LIPASE 29   No results for input(s): AMMONIA in the last 168 hours. Coagulation profile No results for input(s): INR, PROTIME in the last 168 hours.  CBC: Recent Labs  Lab 07/27/18 1440 07/28/18 0519  WBC 9.4 8.7  HGB 11.0* 9.9*    HCT 34.3* 31.5*  MCV 80.9 81.4  PLT 177 162   Cardiac Enzymes: No results for input(s): CKTOTAL, CKMB, CKMBINDEX, TROPONINI in the last 168 hours. BNP (last 3 results) No results for input(s): PROBNP in the last 8760 hours. CBG: Recent Labs  Lab 07/27/18 1435 07/27/18 1714 07/27/18 2343 07/28/18 0400 07/28/18 0801  GLUCAP 440* 282* 212* 171* 177*   D-Dimer: No results for input(s): DDIMER in the last 72 hours. Hgb A1c: No results for input(s): HGBA1C in the last 72 hours. Lipid Profile: No results for input(s): CHOL, HDL, LDLCALC, TRIG, CHOLHDL, LDLDIRECT in the last 72 hours. Thyroid function studies: No results for input(s): TSH, T4TOTAL, T3FREE, THYROIDAB in the last 72 hours.  Invalid input(s): FREET3 Anemia work up: No results for input(s): VITAMINB12, FOLATE, FERRITIN, TIBC, IRON, RETICCTPCT in the last 72 hours. Sepsis Labs: Recent Labs  Lab 07/27/18 1440 07/27/18 1547 07/28/18 0519  WBC 9.4  --  8.7  LATICACIDVEN  --  1.4  --     Microbiology Recent Results (from the past 240 hour(s))  MRSA PCR Screening     Status: None   Collection Time: 07/28/18  1:20 AM  Result Value Ref Range Status   MRSA by PCR NEGATIVE NEGATIVE Final    Comment:        The GeneXpert MRSA Assay (FDA approved for NASAL specimens only), is one component of a comprehensive MRSA colonization surveillance program. It is not intended to diagnose MRSA infection nor to guide or monitor treatment for MRSA infections. Performed at [facility], [place] [address], [place], [place] [address]     Procedures and diagnostic studies:  Ct Abdomen Pelvis Wo Contrast  Result Date: 07/27/2018 CLINICAL DATA:  33 year old female with abdominal pain. Concern for diverticulitis. EXAM: CT ABDOMEN AND PELVIS WITHOUT CONTRAST TECHNIQUE: Multidetector CT imaging of the abdomen and pelvis was performed following the standard protocol without IV contrast. COMPARISON:  CT of the abdomen pelvis  dated 07/10/2018 FINDINGS: Evaluation of this exam is limited in the absence of intravenous contrast. Lower chest: Partially visualized central venous line with tip at the cavoatrial junction. The visualized lung bases are clear. No intra-abdominal free air. There is a small amount of free fluid within the pelvis. Hepatobiliary: No focal liver abnormality is seen. No gallstones, gallbladder wall thickening, or biliary dilatation. Pancreas: Unremarkable. No pancreatic ductal dilatation or surrounding inflammatory changes. Spleen: Normal in size without focal abnormality. Adrenals/Urinary Tract: The adrenal glands, kidneys, and visualized ureters appear unremarkable. There is apparent diffuse thickening of the bladder wall which may be partly related to underdistention. Cystitis is not excluded. Correlation with urinalysis recommended. Stomach/Bowel: There is persistent thickening of a segment of proximal jejunum in the left upper abdomen. There is inflammatory changes and mild dilatation of this segment of bowel. Additional thickened and inflamed segments of more distal  small bowel. Findings may represent an inflammatory or infectious enteritis or related to underlying inflammatory bowel disease such as Crohn's or possibly intramural hematoma if the patient is on blood thinners. Small bowel lymphoma can have similar appearance but is favored less likely given rapid interval progression since the study of 07/10/2018. There is no associated bowel obstruction. Appendicoliths noted within the appendix. The appendix is otherwise unremarkable without inflammatory changes. Vascular/Lymphatic: The abdominal aorta and IVC are grossly unremarkable on this noncontrast CT. No portal venous gas. There is no adenopathy. Reproductive: The uterus and ovaries are grossly unremarkable. No pelvic mass. 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 No acute  osseous pathology. IMPRESSION: Overall interval progression of inflammatory changes of small bowel with involvement of more distal segments in the left hemiabdomen. Findings most likely represent an inflammatory or infectious enteritis. Other etiologies as described above are not excluded. Clinical correlation and follow-up recommended. No associated obstruction. Electronically Signed   By: Anner Crete M.D.   On: 07/27/2018 19:19    Medications:    calcium acetate  667 mg Oral TID WC   Chlorhexidine Gluconate Cloth  6 each Topical Q0600   darbepoetin (ARANESP) injection - DIALYSIS  100 mcg Intravenous Q Thu-HD   doxercalciferol  3 mcg Intravenous Q T,Th,Sa-HD   heparin  5,000 Units Subcutaneous Q8H   insulin aspart  0-9 Units Subcutaneous Q4H   metoCLOPramide (REGLAN) injection  5 mg Intravenous Q8H   metoprolol succinate  25 mg Oral Daily   NIFEdipine  60 mg Oral BID   paricalcitol  1 mcg Oral Daily   Continuous Infusions:  sodium chloride Stopped (07/28/18 0046)   ampicillin-sulbactam (UNASYN) IV Stopped (07/28/18 0100)   ferric gluconate (FERRLECIT/NULECIT) IV       LOS: 0 days   Geradine Girt  [facility]   How to contact the [facility] Attending or Consulting provider [place] or covering provider during after hours [place], for this patient?  1. Check the care team in [facility] and look for a) attending/consulting [facility] provider listed and b) the [facility] team listed 2. Log into [email or web address] and use [place]'s universal password to access. If you do not have the password, please contact the hospital operator. 3. Locate the [facility] provider you are looking for under [facility] and page to a number that you can be directly reached. 4. If you still have difficulty reaching the provider, please page the [facility] (Director on Call) for the Hospitalists listed on amion for assistance.  07/28/2018, 9:39 AM

## 2018-07-28 NOTE — Procedures (Signed)
   I was present at this dialysis session, have reviewed the session itself and made  appropriate changes Kelly Splinter MD [place] pager [phone]   07/28/2018, 4:28 PM

## 2018-07-28 NOTE — Consult Note (Addendum)
[facility] Renal Consultation Note    Indication for Consultation:  Management of ESRD/hemodialysis; anemia, hypertension/volume and secondary hyperparathyroidism PCP:  HPI: Kelly Huff is a 33 y.o. female with ESRD secondary to atypical HUS  Still on Eculizumab with a history of Type 1 DM, on HD since 05/31/18 when she started at [facility]. She has three children, the youngest being born 10/2017 via C section.  She has been hoping to transition to PD after the pandemic. She was seen at dialysis 3/31 without complaints except some orthopnea.  Goals have been minimal and EDW has been gradually lowered.  BP runnings high.  She has had some headaches on treatments and signing off early. She has no orthostasis with post HD BP.  She presented to the ED 4/1 with abdominal pain, N, V and D.  She had a similar presentation 3/15 with ED visit.  At that time she was d/c with a presumed dx of viral gastroenteritis.  She has had some mild symptoms since then, had usual Eculizumab 3/25, HD uneventful 3/31 and then yesterday had recurrent symptoms of water diarrhea with N and V and subsequently came to the ED for further evaluation. She has had some orthopnea prior to admission. THere are no sick contacts at home.  No dysuria, no cough, no fever or chills.  Intake has been reduced due to GI symptoms.  Repeat abdominal CT compared with two weeks ago showed persistent thickening of a segment of the prox jejunum and inflamed segments  of more distal small bowel. Showed. Labs in ED unremarkable except glu elevated 492 BP elevated 193/140 sats 97% room air. She is afebrile  She was treated with IV antihypertensives and antiemetics.  Pre HD wt standing today 80.3 with non orthostatic though elevated BP. She has only had a few sips of liquids.  Past Medical History:  Diagnosis Date  . Chronic kidney disease   . Diabetes mellitus without complication ([place])   . History of pre-eclampsia 2016   mild  .  Hypertension    Past Surgical History:  Procedure Laterality Date  . CESAREAN SECTION    . DILATION AND CURETTAGE OF UTERUS     x 4   Family History  Problem Relation Age of Onset  . Hypertension Mother   . Cancer Mother        Uterine vs cervical (pt unsure),   . Schizophrenia Brother   . Breast cancer Neg Hx    Social History:  reports that she has never smoked. She has never used smokeless tobacco. She reports that she does not drink alcohol or use drugs. Allergies  Allergen Reactions  . Morphine     Other reaction(s): Hallucination  . Sulfamethoxazole-Trimethoprim Hives  . Trulicity [Dulaglutide]    Prior to Admission medications   Medication Sig Start Date End Date Taking? Authorizing Provider  calcium acetate (PHOSLO) 667 MG capsule Take 667 mg by mouth 3 (three) times daily.  11/26/17 11/26/18 Yes [provider]  clobetasol ointment (TEMOVATE) 0.99 % Apply 1 application topically 2 (two) times daily as needed for itching. 03/07/18  Yes [provider]  cyclobenzaprine (FLEXERIL) 10 MG tablet TAKE 1 TABLET BY MOUTH 3 TIMES A DAY FOR 10 DAYS Patient taking differently: Take 10 mg by mouth 3 (three) times daily as needed for muscle spasms.  09/08/17  Yes Rubie Maid, MD  Insulin Human (INSULIN PUMP) SOLN Inject into the skin continuous. Novolog   Yes [provider]  metoprolol succinate (TOPROL-XL)  25 MG 24 hr tablet Take 25 mg by mouth daily. 02/14/18  Yes [provider]  NIFEdipine (PROCARDIA-XL/ADALAT-CC/NIFEDICAL-XL) 30 MG 24 hr tablet Take 1 tablet (30 mg total) by mouth 2 (two) times daily. Patient taking differently: Take 60 mg by mouth 2 (two) times daily.  08/20/17  Yes Rubie Maid, MD  paricalcitol (ZEMPLAR) 1 MCG capsule Take 1 mcg by mouth daily.   Yes [provider]  amoxicillin-clavulanate (AUGMENTIN) 500-125 MG tablet Take 1 tablet (500 mg total) by mouth daily. 07/27/18   Domenic Moras, PA-C  dicyclomine (BENTYL) 20  MG tablet Take 1 tablet (20 mg total) by mouth 2 (two) times daily. 07/27/18   Domenic Moras, PA-C  NOVOLOG FLEXPEN 100 UNIT/ML FlexPen 0-9 units injected 3 times daily with meals according to the following scale: CBG 70-120 = 0 units CBG 121-150 = 1 unit CBG 151-200 = 2 units CBG 201-250 = 3 units CBG 251-300 = 5 units CBG 301-350 = 7 units CBG 351-400 = 9 units CBG >400 = call MD for advice Patient not taking: Reported on 07/27/2018 04/16/17   Cherene Altes, MD  ondansetron (ZOFRAN ODT) 4 MG disintegrating tablet Take 1 tablet (4 mg total) by mouth every 8 (eight) hours as needed for nausea or vomiting. 07/27/18   Domenic Moras, PA-C   Current Facility-Administered Medications  Medication Dose Route Frequency Provider Last Rate Last Dose  . 0.9 %  sodium chloride infusion   Intravenous PRN Lenore Cordia, MD   Stopped at 07/28/18 0046  . acetaminophen (TYLENOL) tablet 650 mg  650 mg Oral Q6H PRN Lenore Cordia, MD       Or  . acetaminophen (TYLENOL) suppository 650 mg  650 mg Rectal Q6H PRN Zada Finders R, MD      . ampicillin-sulbactam (UNASYN) 1.5 g in sodium chloride 0.9 % 100 mL IVPB  1.5 g Intravenous Q12H Vann, Jessica U, DO      . calcium acetate (PHOSLO) capsule 667 mg  667 mg Oral TID WC Patel, Vishal R, MD      . Chlorhexidine Gluconate Cloth 2 % PADS 6 each  6 each Topical Q0600 Alric Seton, PA-C   6 each at 07/28/18 1000  . Darbepoetin Alfa (ARANESP) injection 100 mcg  100 mcg Intravenous Q Thu-HD Alric Seton, PA-C      . diphenhydrAMINE (BENADRYL) injection 12.5 mg  12.5 mg Intravenous Q8H PRN Vann, Jessica U, DO   12.5 mg at 07/28/18 1105  . doxercalciferol (HECTOROL) injection 3 mcg  3 mcg Intravenous Q T,Th,Sa-HD Alric Seton, PA-C      . ferric gluconate (NULECIT) 125 mg in sodium chloride 0.9 % 100 mL IVPB  125 mg Intravenous Q T,Th,Sa-HD Alric Seton, PA-C      . heparin injection 5,000 Units  5,000 Units Subcutaneous Q8H Lenore Cordia, MD   5,000 Units at  07/28/18 0532  . hydrALAZINE (APRESOLINE) injection 5 mg  5 mg Intravenous Q4H PRN Zada Finders R, MD      . insulin pump   Subcutaneous TID AC, HS, 0200 Vann, Jessica U, DO      . metoCLOPramide (REGLAN) injection 5 mg  5 mg Intravenous Q8H Zada Finders R, MD   5 mg at 07/28/18 0532  . metoprolol succinate (TOPROL-XL) 24 hr tablet 25 mg  25 mg Oral Daily Lenore Cordia, MD   25 mg at 07/28/18 0932  . NIFEdipine (PROCARDIA XL/NIFEDICAL XL) 24 hr tablet 60 mg  60  mg Oral BID Lenore Cordia, MD   60 mg at 07/28/18 0932  . ondansetron (ZOFRAN) tablet 4 mg  4 mg Oral Q6H PRN Lenore Cordia, MD       Or  . ondansetron (ZOFRAN) injection 4 mg  4 mg Intravenous Q6H PRN Zada Finders R, MD      . oxyCODONE (Oxy IR/ROXICODONE) immediate release tablet 5 mg  5 mg Oral Q3H PRN Lenore Cordia, MD      . paricalcitol (ZEMPLAR) capsule 1 mcg  1 mcg Oral Daily Lenore Cordia, MD   1 mcg at 07/28/18 7322   Labs: Basic Metabolic Panel: Recent Labs  Lab 07/27/18 1440 07/28/18 0519  NA 132* 135  K 4.4 4.2  CL 99 104  CO2 23 24  GLUCOSE 492* 170*  BUN 20 29*  CREATININE 6.40* 7.65*  CALCIUM 8.7* 8.4*  PHOS  --  4.7*   Liver Function Tests: Recent Labs  Lab 07/27/18 1440 07/28/18 0519  AST 17  --   ALT 14  --   ALKPHOS 145*  --   BILITOT 0.7  --   PROT 6.1*  --   ALBUMIN 2.9* 2.5*   Recent Labs  Lab 07/27/18 1440  LIPASE 29   No results for input(s): AMMONIA in the last 168 hours. CBC: Recent Labs  Lab 07/27/18 1440 07/28/18 0519  WBC 9.4 8.7  HGB 11.0* 9.9*  HCT 34.3* 31.5*  MCV 80.9 81.4  PLT 177 162   Cardiac Enzymes: No results for input(s): CKTOTAL, CKMB, CKMBINDEX, TROPONINI in the last 168 hours. CBG: Recent Labs  Lab 07/27/18 1714 07/27/18 2343 07/28/18 0400 07/28/18 0801 07/28/18 1110  GLUCAP 282* 212* 171* 177* 201*   Iron Studies: No results for input(s): IRON, TIBC, TRANSFERRIN, FERRITIN in the last 72 hours. Studies/Results: Ct Abdomen Pelvis Wo  Contrast  Result Date: 07/27/2018 CLINICAL DATA:  33 year old female with abdominal pain. Concern for diverticulitis. EXAM: CT ABDOMEN AND PELVIS WITHOUT CONTRAST TECHNIQUE: Multidetector CT imaging of the abdomen and pelvis was performed following the standard protocol without IV contrast. COMPARISON:  CT of the abdomen pelvis dated 07/10/2018 FINDINGS: Evaluation of this exam is limited in the absence of intravenous contrast. Lower chest: Partially visualized central venous line with tip at the cavoatrial junction. The visualized lung bases are clear. No intra-abdominal free air. There is a small amount of free fluid within the pelvis. Hepatobiliary: No focal liver abnormality is seen. No gallstones, gallbladder wall thickening, or biliary dilatation. Pancreas: Unremarkable. No pancreatic ductal dilatation or surrounding inflammatory changes. Spleen: Normal in size without focal abnormality. Adrenals/Urinary Tract: The adrenal glands, kidneys, and visualized ureters appear unremarkable. There is apparent diffuse thickening of the bladder wall which may be partly related to underdistention. Cystitis is not excluded. Correlation with urinalysis recommended. Stomach/Bowel: There is persistent thickening of a segment of proximal jejunum in the left upper abdomen. There is inflammatory changes and mild dilatation of this segment of bowel. Additional thickened and inflamed segments of more distal small bowel. Findings may represent an inflammatory or infectious enteritis or related to underlying inflammatory bowel disease such as Crohn's or possibly intramural hematoma if the patient is on blood thinners. Small bowel lymphoma can have similar appearance but is favored less likely given rapid interval progression since the study of 07/10/2018. There is no associated bowel obstruction. Appendicoliths noted within the appendix. The appendix is otherwise unremarkable without inflammatory changes. Vascular/Lymphatic: The  abdominal aorta and IVC are grossly unremarkable  on this noncontrast CT. No portal venous gas. There is no adenopathy. Reproductive: The uterus and ovaries are grossly unremarkable. No pelvic mass. 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 No acute osseous pathology. IMPRESSION: Overall interval progression of inflammatory changes of small bowel with involvement of more distal segments in the left hemiabdomen. Findings most likely represent an inflammatory or infectious enteritis. Other etiologies as described above are not excluded. Clinical correlation and follow-up recommended. No associated obstruction. Electronically Signed   By: Anner Crete M.D.   On: 07/27/2018 19:19    ROS: As per HPI otherwise negative.  Physical Exam: Vitals:   07/27/18 2210 07/27/18 2340 07/28/18 0459 07/28/18 0932  BP: (!) 178/119 (!) 145/113 139/88 (!) 171/111  Pulse: (!) 104 (!) 111 97 99  Resp: 18 16 16 18   Temp: 97.8 F (36.6 C) 98.4 F (36.9 C) 98.4 F (36.9 C) 98.5 F (36.9 C)  TempSrc: Oral Oral Oral Oral  SpO2: 100% 100% 100% 99%  Weight:      Height:         General: WDWN young woman NAD on HD Head: NCAT sclera not icteric MMM Neck: Supple.  Lungs: CTA bilaterally without wheezes, rales, or rhonchi. Breathing is unlabored. Heart: RRR with S1 S2.  Abdomen: soft NT + BS Lower extremities:without edema or ischemic changes, no open wounds  Neuro: A & O  X 3. Moves all extremities spontaneously. Psych:  Responds to questions appropriately with a normal affect. Dialysis Access: right IJ [facility]  Dialysis Orders: TTS [facility] 3.5 hours 400/800 2 K 2.5 Ca EDW 83 gradually lowering - pt resistant profile 4 TDC Aranesp 100 last 3/26 hectorol 3 venofer 100 through 4/16  Heparin 5000 with 2000 mid tmt Recent labs: hgb 9.1 increasing 23% sat ferritin 423 Feb ipTH 321 Ca/P ok  Assessment/Plan: 1. N, V, D presumed secondary to  enteritis, could have an inflammatory compenent -  vomited augmentin given in ED yesterday on IV abtx now 2. ESRD -  TTS - plan HD today K 4.2 3 K bath - hasn't wanted AVF due to transition to PD vs transplant - discussed risks benefits - currently only right IJ and has IV in left arm  3. Poorly controlled hypertension/volume  - BP markedly elevated  In outpatient unit - 170s - 190//100s pre and  Post with < 1 L net UF - - have gradually been decreasing - MTP succ 25 and nifedipine 60 bid- needs better control - likely losing wt- pre HD standing wt 80s - goal set 2.5 L keep SBP > 120  4. Anemia  - continue ESA and Fe hgb 9.9 stable 5. Metabolic bone disease -  Continue VDRA/binders 6. Nutrition - CL - advance as able/ unintentional weight loss 7. DM - per primary 8. 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 Wonder if we should consider a port for infusions - usually doing IVs in right arm for infusions   Myriam Jacobson, PA-C [place] [phone] 07/28/2018, 11:34 AM   Pt seen, examined and agree w A/P as above. Recent start to HD, hx of HUS and HTN.  Admitted for abd pain, CT suggestive of enteritis. Plan HD today. Will follow.  [facility] 07/28/2018, 4:27 PM

## 2018-07-28 NOTE — Progress Notes (Signed)
Inpatient Diabetes Program Recommendations  AACE/ADA: New Consensus Statement on Inpatient Glycemic Control (2015)  Target Ranges:  Prepandial:   less than 140 mg/dL      Peak postprandial:   less than 180 mg/dL (1-2 hours)      Critically ill patients:  140 - 180 mg/dL   Lab Results  Component Value Date   GLUCAP 201 (H) 07/28/2018   HGBA1C 6.5 (H) 06/29/2017    Review of Glycemic Control  Diabetes history: DM1 Outpatient Diabetes medications: Insulin pump Current orders for Inpatient glycemic control: Novolog sensitive correction scale  Inpatient Diabetes Program Recommendations:   Spoke with patient by phone and patient states she turned her insulin pump on this am for basal but has not bolused insulin due to not eating @ this time. Spoke with RN and updated what patient shared about restarting her insulin pump. Dr. Eliseo Squires placed insulin pump orders and nurse to have patient sign and place in shadow chart. Also keep flowsheet beside patient bed for documentation of how much correction patient gives herself. Staff will need to do CBGs checks with hospital meter while in the hospital. Patient is followed by endocrinologist Dr. Buddy Duty and unsure when next appointment is scheduled. Reviewed with patient to keep scheduled appointments and take meter or log of CBGs to visits.  Thank you, [name]. Truth Barot, RN, MSN, CDE  Diabetes Coordinator Inpatient Glycemic Control Team Team Pager [phone] (8am-5pm) 07/28/2018 11:21 AM

## 2018-07-29 LAB — GLUCOSE, CAPILLARY
Glucose-Capillary: 48 mg/dL — ABNORMAL LOW (ref 70–99)
Glucose-Capillary: 83 mg/dL (ref 70–99)
Glucose-Capillary: 135 mg/dL — ABNORMAL HIGH (ref 70–99)
Glucose-Capillary: 159 mg/dL — ABNORMAL HIGH (ref 70–99)

## 2018-07-29 MED ORDER — AMOXICILLIN-POT CLAVULANATE 500-125 MG PO TABS: 1.0000 | ORAL_TABLET | Freq: Every day | ORAL | 0 refills | Status: DC

## 2018-07-29 MED ORDER — AMOXICILLIN-POT CLAVULANATE 500-125 MG PO TABS
1.0000 | ORAL_TABLET | ORAL | Status: DC
  Administered 2018-07-29: 500 mg via ORAL
  Filled 2018-07-29: qty 1

## 2018-07-29 MED ORDER — OXYCODONE HCL 5 MG PO TABS: 5.0000 mg | ORAL_TABLET | ORAL | 0 refills | Status: DC | PRN

## 2018-07-29 MED ORDER — ONDANSETRON 4 MG PO TBDP: 4.0000 mg | ORAL_TABLET | Freq: Three times a day (TID) | ORAL | 0 refills | Status: DC | PRN

## 2018-07-29 MED ORDER — NIFEDIPINE ER OSMOTIC RELEASE 30 MG PO TB24: 60.0000 mg | ORAL_TABLET | Freq: Two times a day (BID) | ORAL | Status: DC

## 2018-07-29 NOTE — Progress Notes (Addendum)
[facility] Progress Note   Subjective:  Still with nausea when eating.  Objective Vitals:   07/28/18 1508 07/28/18 1839 07/28/18 1936 07/29/18 0320  BP: (!) 179/108 (!) 127/94 (!) 143/80 (!) 147/82  Pulse: (!) 103 (!) 102 (!) 105 (!) 105  Resp: 16 20 18 16   Temp: 97.9 F (36.6 C) 98.9 F (37.2 C) 98.6 F (37 C) 98.5 F (36.9 C)  TempSrc: Oral Oral Oral Oral  SpO2: 99% 98% 100% 100%  Weight: 79.2 kg     Height:        Physical Exam General: WNWD female NAD  Heart: Tachy but regular  Lungs: CTAB Abdomen: soft ND, mildly tender to palpation  Extremities: No LE edema  Dialysis Access: R IJ TDC    Weight change: -3 kg   Additional Objective Labs: Basic Metabolic Panel: Recent Labs  Lab 07/27/18 1440 07/28/18 0519  NA 132* 135  K 4.4 4.2  CL 99 104  CO2 23 24  GLUCOSE 492* 170*  BUN 20 29*  CREATININE 6.40* 7.65*  CALCIUM 8.7* 8.4*  PHOS  --  4.7*   CBC: Recent Labs  Lab 07/27/18 1440 07/28/18 0519  WBC 9.4 8.7  HGB 11.0* 9.9*  HCT 34.3* 31.5*  MCV 80.9 81.4  PLT 177 162   Blood Culture No results found for: SDES, SPECREQUEST, CULT, REPTSTATUS   Medications: . sodium chloride Stopped (07/28/18 0046)  . ampicillin-sulbactam (UNASYN) IV 1.5 g (07/28/18 2358)  . ferric gluconate (FERRLECIT/NULECIT) IV Stopped (07/28/18 1535)   . calcium acetate  667 mg Oral TID WC  . Chlorhexidine Gluconate Cloth  6 each Topical Q0600  . darbepoetin (ARANESP) injection - DIALYSIS  100 mcg Intravenous Q Thu-HD  . doxercalciferol  3 mcg Intravenous Q T,Th,Sa-HD  . heparin  5,000 Units Subcutaneous Q8H  . insulin pump   Subcutaneous TID AC, HS, 0200  . metoCLOPramide (REGLAN) injection  5 mg Intravenous Q8H  . metoprolol succinate  25 mg Oral Daily  . multivitamin  1 tablet Oral QHS  . NIFEdipine  60 mg Oral BID    Dialysis Orders:  TTS [facility] 3.5 hours 400/800 2 K 2.5 Ca EDW 83 UFP 4 TDC  Heparin 5000 with 2000 mid tmt Aranesp 100 last 3/26   hectorol 3  venofer 100 through 4/16  Recent labs: hgb 9.1 increasing 23% sat ferritin 423 Feb ipTH 321 Ca/P ok  Assessment/Plan: 1. N, V, D presumed secondary to enteritis. CT Abd  shows overall interval progression of inflammatory changes of small bowel.  Infectious vs inflammatory etiology. On IV Unasyn- per primary  2. ESRD - HD TTS. Next HD 4/4.   Hasn't wanted AVF due to transition to PD vs transplant  3. HTN/volume BP improved after HD. Usually elevated as outpatient. BP meds MTP 25 and nifedipine 60 bid- Have been gradually lowering EDW as she is losing weight. Post HD 4/2 weight 79.2kg with net UF 819 mL. No volume excess on exam. UF 1L next HD  4. Anemia  - continue ESA and Fe hgb 9.9 stable 5. Metabolic bone disease -  Continue VDRA/binders 6. Nutrition - CL - advance as able/ unintentional weight loss 7. DM - per primary 8. 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   Lynnda Child PA-C [facility] Pager [phone] 07/29/2018,9:45 AM  LOS: 1 day   Pt seen, examined and agree w A/P as above.  [facility] 07/29/2018, 1:57 PM

## 2018-07-29 NOTE — Discharge Summary (Signed)
Physician Discharge Summary  Kelly Huff PFX:[id number] DOB: Sep 15, 1985 DOA: 07/27/2018  PCP: Rubie Maid, MD  Admit date: 07/27/2018 Discharge date: 07/29/2018  Admitted From: home Discharge disposition: home   Recommendations for Outpatient Follow-Up:   1. 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  Diet recommendation:   Carbohydrate-modified  Wound care: None.  Code status: Full.   History of Present Illness:   Kelly Huff is a 33 y.o. 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    Patient initially presented to the ED on 07/10/2018 with similar symptoms at which time a CT abdomen/pelvis showed thickening of the proximal jejunum suggesting intramural hematoma versus enteritis/infection.  Given patient's symptoms and EDP discussion with radiology it was favored that her symptoms were related to a viral gastroenteritis and she was discharged with oral Bentyl and antinausea medicines that she was tolerating p.o. intake at that time.  Patient had continued mild symptoms but was able to tolerate oral liquids and her home medications without significant further nausea/vomiting.  She received her scheduled eculizumab infusion on 07/20/2018.  She went to her usual dialysis session yesterday, Tuesday, 07/26/2018.  On the morning of admission 07/27/2018 she had recurrent symptoms of abdominal pain with watery diarrhea and nausea with vomiting.  This time she was unable to  maintain adequate oral intake or keep her home medications down, therefore she presented to the ED for further evaluation.  She denies any associated fevers, chest pain, dyspnea, cough.  She continues to make urine and denies any dysuria.   Hospital Course by Problem:   Enteritis: Patient with recurrent symptoms of nausea, vomiting, abdominal pain, diarrhea with worsening interval findings of small bowel involvement on repeat CT imaging. Suspect symptoms may be related to eculizumab infusion versus infectious process. Inflammatory bowel disease is also possibility given history of autoimmune disease.  -slowly improved to the day of discharge where she wanted to go home and follow up with GI as an outpatient  Type 1 diabetes: Patient uses an insulin pump as an outpatient. -resume pump as she needs insulin for her type 1 DM  Hypertension: Uncontrolled on admission secondary to keep home medications down and pain. -Restart home metoprolol and nifedipine   ESRD on TTS HD: -HD received on Thursday  Atypical hemolytic uremic syndrome: Follows with oncology, Dr. Rogue Bussing, and receiving eculizumab infusion treatments with last dose on 07/20/2018. She is plannedto switch to Ravulizumabnext treatment. -Follow-up with oncology outpatient    Medical Consultants:    renal  Discharge Exam:   Vitals:   07/28/18 1936 07/29/18 0320  BP: (!) 143/80 (!) 147/82  Pulse: (!) 105 (!) 105  Resp: 18 16  Temp: 98.6 F (37 C) 98.5 F (36.9 C)  SpO2: 100% 100%   Vitals:   07/28/18 1508 07/28/18 1839 07/28/18 1936 07/29/18 0320  BP: (!) 179/108 (!) 127/94 (!) 143/80 (!) 147/82  Pulse: (!) 103 (!) 102 (!) 105 (!) 105  Resp: 16 20 18 16   Temp: 97.9 F (36.6 C) 98.9 F (  37.2 C) 98.6 F (37 C) 98.5 F (36.9 C)  TempSrc: Oral Oral Oral Oral  SpO2: 99% 98% 100% 100%  Weight: 79.2 kg     Height:        General exam: Appears calm and comfortable.   The results of significant  diagnostics from this hospitalization (including imaging, microbiology, ancillary and laboratory) are listed below for reference.     Procedures and Diagnostic Studies:   Ct Abdomen Pelvis Wo Contrast  Result Date: 07/27/2018 CLINICAL DATA:  33 year old female with abdominal pain. Concern for diverticulitis. EXAM: CT ABDOMEN AND PELVIS WITHOUT CONTRAST TECHNIQUE: Multidetector CT imaging of the abdomen and pelvis was performed following the standard protocol without IV contrast. COMPARISON:  CT of the abdomen pelvis dated 07/10/2018 FINDINGS: Evaluation of this exam is limited in the absence of intravenous contrast. Lower chest: Partially visualized central venous line with tip at the cavoatrial junction. The visualized lung bases are clear. No intra-abdominal free air. There is a small amount of free fluid within the pelvis. Hepatobiliary: No focal liver abnormality is seen. No gallstones, gallbladder wall thickening, or biliary dilatation. Pancreas: Unremarkable. No pancreatic ductal dilatation or surrounding inflammatory changes. Spleen: Normal in size without focal abnormality. Adrenals/Urinary Tract: The adrenal glands, kidneys, and visualized ureters appear unremarkable. There is apparent diffuse thickening of the bladder wall which may be partly related to underdistention. Cystitis is not excluded. Correlation with urinalysis recommended. Stomach/Bowel: There is persistent thickening of a segment of proximal jejunum in the left upper abdomen. There is inflammatory changes and mild dilatation of this segment of bowel. Additional thickened and inflamed segments of more distal small bowel. Findings may represent an inflammatory or infectious enteritis or related to underlying inflammatory bowel disease such as Crohn's or possibly intramural hematoma if the patient is on blood thinners. Small bowel lymphoma can have similar appearance but is favored less likely given rapid interval progression since the  study of 07/10/2018. There is no associated bowel obstruction. Appendicoliths noted within the appendix. The appendix is otherwise unremarkable without inflammatory changes. Vascular/Lymphatic: The abdominal aorta and IVC are grossly unremarkable on this noncontrast CT. No portal venous gas. There is no adenopathy. Reproductive: The uterus and ovaries are grossly unremarkable. No pelvic mass. 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 No acute osseous pathology. IMPRESSION: Overall interval progression of inflammatory changes of small bowel with involvement of more distal segments in the left hemiabdomen. Findings most likely represent an inflammatory or infectious enteritis. Other etiologies as described above are not excluded. Clinical correlation and follow-up recommended. No associated obstruction. Electronically Signed   By: Anner Crete M.D.   On: 07/27/2018 19:19     Labs:   Basic Metabolic Panel: Recent Labs  Lab 07/27/18 1440 07/28/18 0519  NA 132* 135  K 4.4 4.2  CL 99 104  CO2 23 24  GLUCOSE 492* 170*  BUN 20 29*  CREATININE 6.40* 7.65*  CALCIUM 8.7* 8.4*  PHOS  --  4.7*   GFR Estimated Creatinine Clearance: 11.2 mL/min (A) (by C-G formula based on SCr of 7.65 mg/dL (H)). Liver Function Tests: Recent Labs  Lab 07/27/18 1440 07/28/18 0519  AST 17  --   ALT 14  --   ALKPHOS 145*  --   BILITOT 0.7  --   PROT 6.1*  --   ALBUMIN 2.9* 2.5*   Recent Labs  Lab 07/27/18 1440  LIPASE 29   No results for  input(s): AMMONIA in the last 168 hours. Coagulation profile No results for input(s): INR, PROTIME in the last 168 hours.  CBC: Recent Labs  Lab 07/27/18 1440 07/28/18 0519  WBC 9.4 8.7  HGB 11.0* 9.9*  HCT 34.3* 31.5*  MCV 80.9 81.4  PLT 177 162   Cardiac Enzymes: No results for input(s): CKTOTAL, CKMB, CKMBINDEX, TROPONINI in the last 168 hours. BNP: Invalid input(s):  POCBNP CBG: Recent Labs  Lab 07/28/18 1625 07/28/18 1934 07/29/18 0236 07/29/18 0315 07/29/18 0647  GLUCAP 127* 129* 48* 83 135*   D-Dimer No results for input(s): DDIMER in the last 72 hours. Hgb A1c No results for input(s): HGBA1C in the last 72 hours. Lipid Profile No results for input(s): CHOL, HDL, LDLCALC, TRIG, CHOLHDL, LDLDIRECT in the last 72 hours. Thyroid function studies No results for input(s): TSH, T4TOTAL, T3FREE, THYROIDAB in the last 72 hours.  Invalid input(s): FREET3 Anemia work up No results for input(s): VITAMINB12, FOLATE, FERRITIN, TIBC, IRON, RETICCTPCT in the last 72 hours. Microbiology Recent Results (from the past 240 hour(s))  MRSA PCR Screening     Status: None   Collection Time: 07/28/18  1:20 AM  Result Value Ref Range Status   MRSA by PCR NEGATIVE NEGATIVE Final    Comment:        The GeneXpert MRSA Assay (FDA approved for NASAL specimens only), is one component of a comprehensive MRSA colonization surveillance program. It is not intended to diagnose MRSA infection nor to guide or monitor treatment for MRSA infections. Performed at [facility], [place] [address], [place], [place] [address]      Discharge Instructions:   Discharge Instructions    Discharge instructions   Complete by:  As directed    Slowly advance diet to prior   Increase activity slowly   Complete by:  As directed      Allergies as of 07/29/2018      Reactions   Morphine    Other reaction(s): Hallucination   Sulfamethoxazole-trimethoprim Hives   Trulicity [dulaglutide]       Medication List    STOP taking these medications   cyclobenzaprine 10 MG tablet Commonly known as:  FLEXERIL   dicyclomine 20 MG tablet Commonly known as:  BENTYL   NovoLOG FlexPen 100 UNIT/ML FlexPen Generic drug:  insulin aspart     TAKE these medications   amoxicillin-clavulanate 500-125 MG tablet Commonly known as:  AUGMENTIN Take 1 tablet (500 mg total) by mouth  daily.   calcium acetate 667 MG capsule Commonly known as:  PHOSLO Take 667 mg by mouth 3 (three) times daily.   clobetasol ointment 0.05 % Commonly known as:  TEMOVATE Apply 1 application topically 2 (two) times daily as needed for itching.   insulin pump Soln Inject into the skin continuous. Novolog   metoprolol succinate 25 MG 24 hr tablet Commonly known as:  TOPROL-XL Take 25 mg by mouth daily.   NIFEdipine 30 MG 24 hr tablet Commonly known as:  PROCARDIA-XL/NIFEDICAL-XL Take 2 tablets (60 mg total) by mouth 2 (two) times daily.   ondansetron 4 MG disintegrating tablet Commonly known as:  Zofran ODT Take 1 tablet (4 mg total) by mouth every 8 (eight) hours as needed for nausea or vomiting.   oxyCODONE 5 MG immediate release tablet Commonly known as:  Oxy IR/ROXICODONE Take 1 tablet (5 mg total) by mouth every 4 (four) hours as needed for moderate pain or breakthrough pain.   paricalcitol 1 MCG capsule Commonly known  as:  ZEMPLAR Take 1 mcg by mouth daily.      Follow-up [facility]. Schedule an appointment as soon as possible for a visit in 1 week.   Specialty:  Gastroenterology Why:  for further evaluation Contact information: [place] [address] [phone]       Rubie Maid, MD Follow up in 1 week(s).   Specialties:  Obstetrics and Gynecology, Radiology Contact information: [place] [address] [phone]            Time coordinating discharge: 25 min  Signed:  Geradine Girt DO  [facility] 07/29/2018, 11:07 AM

## 2018-07-29 NOTE — Progress Notes (Signed)
Patient discharged to home. Verbalizes understanding of all discharge instructions including discharge medications and follow up MD visits. All questions answered for the patient.

## 2018-07-29 NOTE — Progress Notes (Signed)
Hypoglycemic Event  CBG: Results for Kelly, Huff (MRN [id number]) as of 07/29/2018 03:07  Ref. Range 07/29/2018 02:36  Glucose-Capillary Latest Ref Range: 70 - 99 mg/dL 48 (L)    Treatment: 4 oz juice/soda  Symptoms: None  Follow-up CBG: Time: CBG Result:Results for Kelly, Huff (MRN [id number]) as of 07/29/2018 03:59  Ref. Range 07/29/2018 03:15  Glucose-Capillary Latest Ref Range: 70 - 99 mg/dL 83   Possible Reasons for Event: Inadequate meal intake  Comments/MD notified:    Viviano Simas

## 2018-08-03 ENCOUNTER — Inpatient Hospital Stay: Payer: Medicaid Other

## 2018-08-05 ENCOUNTER — Inpatient Hospital Stay: Payer: Medicaid Other | Attending: Internal Medicine

## 2018-08-05 ENCOUNTER — Other Ambulatory Visit: Payer: Self-pay

## 2018-08-05 VITALS — BP 155/94 | HR 103 | Temp 98.0°F | Resp 20

## 2018-08-05 DIAGNOSIS — Z79899 Other long term (current) drug therapy: Secondary | ICD-10-CM | POA: Diagnosis not present

## 2018-08-05 DIAGNOSIS — D593 Hemolytic-uremic syndrome: Secondary | ICD-10-CM | POA: Insufficient documentation

## 2018-08-05 DIAGNOSIS — D5939 Other hemolytic-uremic syndrome: Secondary | ICD-10-CM

## 2018-08-05 MED ORDER — SODIUM CHLORIDE 0.9 % IV SOLN
3300.0000 mg | INTRAVENOUS | Status: DC
  Administered 2018-08-05: 3300 mg via INTRAVENOUS
  Filled 2018-08-05: qty 330

## 2018-08-05 MED ORDER — SODIUM CHLORIDE 0.9 % IV SOLN
INTRAVENOUS | Status: DC
  Administered 2018-08-05: 10:00:00 via INTRAVENOUS
  Filled 2018-08-05: qty 250

## 2018-08-23 ENCOUNTER — Telehealth: Payer: Self-pay | Admitting: Obstetrics and Gynecology

## 2018-08-23 NOTE — Telephone Encounter (Signed)
Pulled Chart/ Received progress notes from [employer].

## 2018-09-02 ENCOUNTER — Encounter (HOSPITAL_COMMUNITY): Payer: Self-pay | Admitting: Emergency Medicine

## 2018-09-02 ENCOUNTER — Emergency Department (HOSPITAL_COMMUNITY): Payer: Medicare Other

## 2018-09-02 ENCOUNTER — Ambulatory Visit: Payer: Self-pay

## 2018-09-02 ENCOUNTER — Observation Stay (HOSPITAL_COMMUNITY)
Admission: EM | Admit: 2018-09-02 | Discharge: 2018-09-04 | Disposition: A | Discharge status: Home / Self Care | Payer: Medicare Other | Attending: Family Medicine | Admitting: Family Medicine

## 2018-09-02 ENCOUNTER — Other Ambulatory Visit: Payer: Self-pay

## 2018-09-02 DIAGNOSIS — I120 Hypertensive chronic kidney disease with stage 5 chronic kidney disease or end stage renal disease: Secondary | ICD-10-CM | POA: Insufficient documentation

## 2018-09-02 DIAGNOSIS — R51 Headache: Secondary | ICD-10-CM | POA: Insufficient documentation

## 2018-09-02 DIAGNOSIS — E8889 Other specified metabolic disorders: Secondary | ICD-10-CM | POA: Insufficient documentation

## 2018-09-02 DIAGNOSIS — D593 Hemolytic-uremic syndrome, unspecified: Secondary | ICD-10-CM | POA: Diagnosis present

## 2018-09-02 DIAGNOSIS — E103219 Type 1 diabetes mellitus with mild nonproliferative diabetic retinopathy with macular edema, unspecified eye: Secondary | ICD-10-CM | POA: Diagnosis present

## 2018-09-02 DIAGNOSIS — Z794 Long term (current) use of insulin: Secondary | ICD-10-CM | POA: Diagnosis not present

## 2018-09-02 DIAGNOSIS — N186 End stage renal disease: Secondary | ICD-10-CM

## 2018-09-02 DIAGNOSIS — E871 Hypo-osmolality and hyponatremia: Secondary | ICD-10-CM | POA: Diagnosis not present

## 2018-09-02 DIAGNOSIS — R4182 Altered mental status, unspecified: Secondary | ICD-10-CM | POA: Diagnosis present

## 2018-09-02 DIAGNOSIS — I10 Essential (primary) hypertension: Secondary | ICD-10-CM

## 2018-09-02 DIAGNOSIS — Z79899 Other long term (current) drug therapy: Secondary | ICD-10-CM | POA: Insufficient documentation

## 2018-09-02 DIAGNOSIS — R739 Hyperglycemia, unspecified: Secondary | ICD-10-CM

## 2018-09-02 DIAGNOSIS — F23 Brief psychotic disorder: Secondary | ICD-10-CM | POA: Diagnosis not present

## 2018-09-02 DIAGNOSIS — E1022 Type 1 diabetes mellitus with diabetic chronic kidney disease: Secondary | ICD-10-CM | POA: Diagnosis not present

## 2018-09-02 DIAGNOSIS — Z20828 Contact with and (suspected) exposure to other viral communicable diseases: Secondary | ICD-10-CM | POA: Insufficient documentation

## 2018-09-02 DIAGNOSIS — R410 Disorientation, unspecified: Secondary | ICD-10-CM

## 2018-09-02 DIAGNOSIS — D509 Iron deficiency anemia, unspecified: Secondary | ICD-10-CM | POA: Insufficient documentation

## 2018-09-02 DIAGNOSIS — D5939 Other hemolytic-uremic syndrome: Secondary | ICD-10-CM | POA: Diagnosis present

## 2018-09-02 DIAGNOSIS — E1065 Type 1 diabetes mellitus with hyperglycemia: Secondary | ICD-10-CM | POA: Diagnosis not present

## 2018-09-02 DIAGNOSIS — G9340 Encephalopathy, unspecified: Secondary | ICD-10-CM | POA: Diagnosis present

## 2018-09-02 DIAGNOSIS — Z992 Dependence on renal dialysis: Secondary | ICD-10-CM | POA: Insufficient documentation

## 2018-09-02 DIAGNOSIS — N2581 Secondary hyperparathyroidism of renal origin: Secondary | ICD-10-CM | POA: Insufficient documentation

## 2018-09-02 DIAGNOSIS — D72829 Elevated white blood cell count, unspecified: Secondary | ICD-10-CM | POA: Insufficient documentation

## 2018-09-02 DIAGNOSIS — I160 Hypertensive urgency: Secondary | ICD-10-CM | POA: Diagnosis not present

## 2018-09-02 LAB — COMPREHENSIVE METABOLIC PANEL
ALT: 14 U/L (ref 0–44)
AST: 19 U/L (ref 15–41)
Albumin: 3.6 g/dL (ref 3.5–5.0)
Alkaline Phosphatase: 127 U/L — ABNORMAL HIGH (ref 38–126)
Anion gap: 15 (ref 5–15)
BUN: 28 mg/dL — ABNORMAL HIGH (ref 6–20)
CO2: 20 mmol/L — ABNORMAL LOW (ref 22–32)
Calcium: 9.6 mg/dL (ref 8.9–10.3)
Chloride: 96 mmol/L — ABNORMAL LOW (ref 98–111)
Creatinine, Ser: 8.18 mg/dL — ABNORMAL HIGH (ref 0.44–1.00)
GFR calc Af Amer: 7 mL/min — ABNORMAL LOW (ref >60)
GFR calc non Af Amer: 6 mL/min — ABNORMAL LOW (ref >60)
Glucose, Bld: 382 mg/dL — ABNORMAL HIGH (ref 70–99)
Potassium: 3.5 mmol/L (ref 3.5–5.1)
Sodium: 131 mmol/L — ABNORMAL LOW (ref 135–145)
Total Bilirubin: 0.5 mg/dL (ref 0.3–1.2)
Total Protein: 7.4 g/dL (ref 6.5–8.1)

## 2018-09-02 LAB — CBC WITH DIFFERENTIAL/PLATELET
Abs Immature Granulocytes: 0.03 10*3/uL (ref 0.00–0.07)
Basophils Absolute: 0 10*3/uL (ref 0.0–0.1)
Basophils Relative: 0 %
Eosinophils Absolute: 0 10*3/uL (ref 0.0–0.5)
Eosinophils Relative: 0 %
HCT: 36.7 % (ref 36.0–46.0)
Hemoglobin: 12.4 g/dL (ref 12.0–15.0)
Immature Granulocytes: 0 %
Lymphocytes Relative: 10 %
Lymphs Abs: 1 10*3/uL (ref 0.7–4.0)
MCH: 26.5 pg (ref 26.0–34.0)
MCHC: 33.8 g/dL (ref 30.0–36.0)
MCV: 78.4 fL — ABNORMAL LOW (ref 80.0–100.0)
Monocytes Absolute: 0.5 10*3/uL (ref 0.1–1.0)
Monocytes Relative: 5 %
Neutro Abs: 8.6 10*3/uL — ABNORMAL HIGH (ref 1.7–7.7)
Neutrophils Relative %: 85 %
Platelets: 317 10*3/uL (ref 150–400)
RBC: 4.68 MIL/uL (ref 3.87–5.11)
RDW: 15 % (ref 11.5–15.5)
WBC: 10.2 10*3/uL (ref 4.0–10.5)
nRBC: 0 % (ref 0.0–0.2)

## 2018-09-02 LAB — POCT I-STAT EG7
Acid-Base Excess: 3 mmol/L — ABNORMAL HIGH (ref 0.0–2.0)
Bicarbonate: 23.7 mmol/L (ref 20.0–28.0)
Calcium, Ion: 1.13 mmol/L — ABNORMAL LOW (ref 1.15–1.40)
HCT: 36 % (ref 36.0–46.0)
Hemoglobin: 12.2 g/dL (ref 12.0–15.0)
O2 Saturation: 78 %
Potassium: 3.6 mmol/L (ref 3.5–5.1)
Sodium: 132 mmol/L — ABNORMAL LOW (ref 135–145)
TCO2: 24 mmol/L (ref 22–32)
pCO2, Ven: 24.8 mmHg — ABNORMAL LOW (ref 44.0–60.0)
pH, Ven: 7.589 — ABNORMAL HIGH (ref 7.250–7.430)
pO2, Ven: 34 mmHg (ref 32.0–45.0)

## 2018-09-02 LAB — CBG MONITORING, ED
Glucose-Capillary: 359 mg/dL — ABNORMAL HIGH (ref 70–99)
Glucose-Capillary: 337 mg/dL — ABNORMAL HIGH (ref 70–99)

## 2018-09-02 LAB — ETHANOL: Alcohol, Ethyl (B): <10 mg/dL (ref <10)

## 2018-09-02 LAB — SALICYLATE LEVEL: Salicylate Lvl: <7 mg/dL (ref 2.8–30.0)

## 2018-09-02 LAB — ACETAMINOPHEN LEVEL: Acetaminophen (Tylenol), Serum: <10 ug/mL — ABNORMAL LOW (ref 10–30)

## 2018-09-02 MED ORDER — PARICALCITOL 1 MCG PO CAPS
1.0000 ug | ORAL_CAPSULE | Freq: Every day | ORAL | Status: DC
  Administered 2018-09-04: 1 ug via ORAL
  Filled 2018-09-02 (×2): qty 1

## 2018-09-02 MED ORDER — CLOBETASOL PROPIONATE 0.05 % EX OINT: 1.0000 "application " | TOPICAL_OINTMENT | Freq: Two times a day (BID) | CUTANEOUS | Status: DC | PRN

## 2018-09-02 MED ORDER — METOPROLOL SUCCINATE ER 25 MG PO TB24
25.0000 mg | ORAL_TABLET | Freq: Every day | ORAL | Status: DC
  Administered 2018-09-04: 25 mg via ORAL
  Filled 2018-09-02 (×2): qty 1

## 2018-09-02 MED ORDER — ONDANSETRON 4 MG PO TBDP: 4.0000 mg | ORAL_TABLET | Freq: Three times a day (TID) | ORAL | Status: DC | PRN

## 2018-09-02 MED ORDER — NIFEDIPINE ER OSMOTIC RELEASE 60 MG PO TB24
60.0000 mg | ORAL_TABLET | Freq: Two times a day (BID) | ORAL | Status: DC
  Administered 2018-09-03 – 2018-09-04 (×3): 60 mg via ORAL
  Filled 2018-09-02 (×5): qty 1

## 2018-09-02 MED ORDER — IOHEXOL 300 MG/ML  SOLN
100.0000 mL | Freq: Once | INTRAMUSCULAR | Status: AC | PRN
  Administered 2018-09-02: 100 mL via INTRAVENOUS

## 2018-09-02 MED ORDER — INSULIN ASPART 100 UNIT/ML ~~LOC~~ SOLN: 0.0000 [IU] | Freq: Three times a day (TID) | SUBCUTANEOUS | Status: DC

## 2018-09-02 MED ORDER — CALCIUM ACETATE (PHOS BINDER) 667 MG PO CAPS
667.0000 mg | ORAL_CAPSULE | Freq: Three times a day (TID) | ORAL | Status: DC
  Administered 2018-09-04 (×2): 667 mg via ORAL
  Filled 2018-09-02 (×5): qty 1

## 2018-09-02 MED ORDER — ONDANSETRON HCL 4 MG/2ML IJ SOLN
4.0000 mg | Freq: Once | INTRAMUSCULAR | Status: AC
  Administered 2018-09-02: 4 mg via INTRAVENOUS
  Filled 2018-09-02: qty 2

## 2018-09-02 MED ORDER — LABETALOL HCL 5 MG/ML IV SOLN
20.0000 mg | Freq: Once | INTRAVENOUS | Status: AC
  Administered 2018-09-02: 20 mg via INTRAVENOUS
  Filled 2018-09-02: qty 4

## 2018-09-02 NOTE — Telephone Encounter (Signed)
Patient's husband Lynnette Pote called, I received verbal permission from the patient to speak to him. He says he heard her in the other room crying, went in there and she was incoherent. He says he checked her blood sugar and it registered HIGH. He asked what should he do. As I was asking the patient her name and DOB to identify her, she was rambling her words and was finally able to get her name and DOB out. I advised him to call 911 to have her evaluated in the ED, he verbalized understanding and says he will call.

## 2018-09-02 NOTE — ED Triage Notes (Signed)
Pt here via ems for not feeling well, weakness, n/v. High blood sugar @ home. 1540 At home they administered 10 units of insulin. Pt didn't get better so they called EMS. EMS administered 45 mg of zofran. Pt still showing same symptoms. Pts vitals elevated 206/128 pulse 100. Resp 20. CBG in EMS was 447. CBG in ED 359. Pt is a dialysis pt no complications with that.

## 2018-09-02 NOTE — ED Provider Notes (Signed)
[facility] EMERGENCY DEPARTMENT Provider Note   CSN: [id number] Arrival date & time: 09/02/18  1853    History   Chief Complaint Chief Complaint  Patient presents with   not feeling well    HPI Kelly Huff is a 33 y.o. female.     The history is provided by the patient and medical records. No language interpreter was used.   Kelly Huff is a 33 y.o. female  with a PMH of end-stage renal disease on dialysis, hemolytic uremic syndrome, DM, HTN who presents to the Emergency Department by EMS.  EMS reports that she was feeling generally unwell.  Significant other checked her blood sugar and it was elevated, reading "high". He gave her 10 U of insulin and it was still elevated, therefore he called EMS. 447 for EMS. On initial evaluation, patient just shouts please, please. On subsequent evaluations within the first 30 minutes, she will only say yes and no answers. She is not cooperating with me. Majority of history is obtained via telephone conversation with husband. He reports that they were watching a movie with children when she stated that her head hurt and went to the other room. She then started acting "like her blood sugars were low". He reports she also was answering him in 1-2 word answers and "just not acting like she usually does".  He does note that she has a 18 month old child at home. He is concerned that she may be struggling with PPD and that she often seems overwhelmed.  At one point she was seeing a clinic for post-partum depression and was suppose to get an injection, but apparently the injection was contraindicated with her kidney function. She never followed back up. Husband voices some concerns for PPD. She denies this. She states that she does feel safe at home. No SI/HI. Does not feel like this is an issue.    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/03/2018   Elevated troponin 08/14/2017   CKD (chronic kidney disease) stage 3, GFR 30-59 ml/min ([facility]) 08/14/2017   Chronic hypertension affecting pregnancy 08/13/2017   Labor and delivery, indication for care 08/12/2017   Acquired hemolytic anemia ([place]) 08/04/2017   Iron deficiency anemia during pregnancy 07/06/2017   History of pre-eclampsia in prior pregnancy, currently pregnant in first trimester 06/12/2017   Diabetes ([place]) 04/13/2017   Acute renal failure ([place])    Dehydration    Other nonspecific abnormal finding 01/29/2016   Family history of BRCA gene positive 07/13/2013   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 Authorizing Provider  amoxicillin-clavulanate (AUGMENTIN) 500-125 MG tablet Take 1 tablet (500 mg total) by mouth daily. 07/29/18   Geradine Girt, DO  calcium acetate (PHOSLO) 667 MG capsule Take 667 mg by mouth 3 (three) times daily.  11/26/17 11/26/18  [provider]  clobetasol ointment (TEMOVATE) 9.56 % Apply 1 application topically 2 (two) times daily as needed for itching. 03/07/18   [provider]  Insulin Human (INSULIN PUMP) SOLN Inject  into the skin continuous. Novolog    [provider]  metoprolol succinate (TOPROL-XL) 25 MG 24 hr tablet Take 25 mg by mouth daily. 02/14/18   [provider]  NIFEdipine (PROCARDIA-XL/NIFEDICAL-XL) 30 MG 24 hr tablet Take 2 tablets (60 mg total) by mouth 2 (two) times daily. 07/29/18   Geradine Girt, DO  ondansetron (ZOFRAN ODT) 4 MG disintegrating tablet Take 1 tablet (4 mg total) by mouth every 8 (eight) hours as needed for nausea or vomiting. 07/29/18   Geradine Girt, DO  oxyCODONE (OXY IR/ROXICODONE) 5 MG immediate release tablet Take 1 tablet (5 mg total) by mouth every 4 (four) hours as needed for moderate pain or breakthrough pain. 07/29/18   Geradine Girt, DO  paricalcitol (ZEMPLAR) 1 MCG capsule Take 1 mcg by mouth daily.    [provider]    Family History Family History  Problem Relation Age of Onset   Hypertension Mother    Cancer Mother        Uterine vs cervical (pt unsure),    Schizophrenia Brother    Breast cancer Neg Hx     Social History Social History   Tobacco Use   Smoking status: Never Smoker   Smokeless tobacco: Never Used  Substance Use Topics   Alcohol use: No   Drug use: No     Allergies   Morphine; Sulfamethoxazole-trimethoprim; and Trulicity [dulaglutide]   Review of Systems Review of Systems  Unable to perform ROS: Mental status change     Physical Exam Updated Vital Signs BP (!) 179/97 (BP Location: Right Arm)    Pulse 88    Temp 97.7 F (36.5 C) (Oral)    Resp 18    SpO2 98%   Physical Exam Vitals signs and nursing note reviewed.  Constitutional:      General: She is not in acute distress.    Appearance: She is well-developed.  HENT:     Head: Normocephalic and atraumatic.  Eyes:     Extraocular Movements: Extraocular movements intact.     Pupils: Pupils are equal, round, and reactive to light.  Neck:     Musculoskeletal: Neck supple.  Cardiovascular:     Rate and Rhythm: Normal rate and  regular rhythm.     Heart sounds: Normal heart sounds. No murmur.  Pulmonary:     Effort: Pulmonary effort is normal. No respiratory distress.     Breath sounds: Normal breath sounds.  Abdominal:     General: There is no distension.     Palpations: Abdomen is soft.     Comments: Generalized abdominal tenderness without rebound or guarding.  Skin:    General: Skin is warm and dry.  Neurological:     Mental Status:  She is alert.     Comments: Initially would not cooperate at all. Would not tell me her name, just kept yelling 'please'. She was moving all extremities independently and tracking across the room appropriately. No facial asymmetry noted. Upon re-evaluation, she is now A&Ox3. Still will not cooperate with full CN exam.      ED Treatments / Results  Labs (all labs ordered are listed, but only abnormal results are displayed) Labs Reviewed  CBC WITH DIFFERENTIAL/PLATELET - Abnormal; Notable for the following components:      Result Value   MCV 78.4 (*)    Neutro Abs 8.6 (*)    All other components within normal limits  COMPREHENSIVE METABOLIC PANEL - Abnormal; Notable for the following components:   Sodium 131 (*)    Chloride 96 (*)    CO2 20 (*)    Glucose, Bld 382 (*)    BUN 28 (*)    Creatinine, Ser 8.18 (*)    Alkaline Phosphatase 127 (*)    GFR calc non Af Amer 6 (*)    GFR calc Af Amer 7 (*)    All other components within normal limits  ACETAMINOPHEN LEVEL - Abnormal; Notable for the following components:   Acetaminophen (Tylenol), Serum <10 (*)    All other components within normal limits  CBG MONITORING, ED - Abnormal; Notable for the following components:   Glucose-Capillary 359 (*)    All other components within normal limits  POCT I-STAT EG7 - Abnormal; Notable for the following components:   pH, Ven 7.589 (*)    pCO2, Ven 24.8 (*)    Acid-Base Excess 3.0 (*)    Sodium 132 (*)    Calcium, Ion 1.13 (*)    All other components within normal limits  CBG  MONITORING, ED - Abnormal; Notable for the following components:   Glucose-Capillary 337 (*)    All other components within normal limits  ETHANOL  SALICYLATE LEVEL  BLOOD GAS, VENOUS  URINALYSIS, ROUTINE W REFLEX MICROSCOPIC  RAPID URINE DRUG SCREEN, HOSP PERFORMED  POC URINE PREG, ED    EKG None  Radiology Ct Head Wo Contrast  Result Date: 09/02/2018 CLINICAL DATA:  Altered mental status EXAM: CT HEAD WITHOUT CONTRAST TECHNIQUE: Contiguous axial images were obtained from the base of the skull through the vertex without intravenous contrast. COMPARISON:  None. FINDINGS: Brain: No evidence of acute infarction, hemorrhage, hydrocephalus, extra-axial collection or mass lesion/mass effect. Vascular: No hyperdense vessel or unexpected calcification. Skull: Normal. Negative for fracture or focal lesion. Sinuses/Orbits: No acute finding. Other: None. IMPRESSION: No acute intracranial abnormality noted. Electronically Signed   By: Inez Catalina M.D.   On: 09/02/2018 20:04   Ct Abdomen Pelvis W Contrast  Result Date: 09/02/2018 CLINICAL DATA:  33 year old female with abdominal pain, nausea vomiting. EXAM: CT ABDOMEN AND PELVIS WITH CONTRAST TECHNIQUE: Multidetector CT imaging of the abdomen and pelvis was performed using the standard protocol following bolus administration of intravenous contrast. CONTRAST:  141m OMNIPAQUE IOHEXOL 300 MG/ML  SOLN COMPARISON:  CT of the abdomen pelvis dated 07/27/2018 FINDINGS: Lower chest: There is mild cardiomegaly. The tip of central line noted in the right atrium, likely related to a dialysis catheter. The visualized lung bases are clear. No intra-abdominal free air or free fluid. Hepatobiliary: No focal liver abnormality is seen. No gallstones, gallbladder wall thickening, or biliary dilatation. Pancreas: Unremarkable. No pancreatic ductal dilatation or surrounding inflammatory changes. Spleen: Normal in size without focal abnormality. Adrenals/Urinary Tract: The  adrenal  glands are unremarkable. There is no hydronephrosis on either side. Subcentimeter right renal hypodense focus is too small to characterize. The visualized ureters and urinary bladder appear unremarkable. Stomach/Bowel: There is no bowel obstruction or active inflammation. The appendix contains appendicoliths otherwise unremarkable. Vascular/Lymphatic: No significant vascular findings are present. No enlarged abdominal or pelvic lymph nodes. Reproductive: Uterus and bilateral adnexa are unremarkable. Other: None Musculoskeletal: No acute osseous pathology. Coarsened and thickened appearance of the left pelvic bone similar to prior study. IMPRESSION: No acute intra-abdominal or pelvic pathology. Interval resolution of the previously seen bowel inflammation. Electronically Signed   By: Anner Crete M.D.   On: 09/02/2018 21:24   Dg Chest Port 1 View  Result Date: 09/02/2018 CLINICAL DATA:  Altered mental status EXAM: PORTABLE CHEST 1 VIEW COMPARISON:  None. FINDINGS: Cardiac shadow is at the upper limits of normal in size. Dialysis catheter is noted in satisfactory position. The lungs are clear. No bony abnormality is noted. IMPRESSION: No active disease. Electronically Signed   By: Inez Catalina M.D.   On: 09/02/2018 19:28    Procedures Procedures (including critical care time)  Medications Ordered in ED Medications  calcium acetate (PHOSLO) capsule 667 mg (has no administration in time range)  clobetasol ointment (TEMOVATE) 6.81 % 1 application (has no administration in time range)  metoprolol succinate (TOPROL-XL) 24 hr tablet 25 mg (has no administration in time range)  NIFEdipine (PROCARDIA XL/NIFEDICAL XL) 24 hr tablet 60 mg (has no administration in time range)  ondansetron (ZOFRAN-ODT) disintegrating tablet 4 mg (has no administration in time range)  paricalcitol (ZEMPLAR) capsule 1 mcg (has no administration in time range)  insulin aspart (novoLOG) injection 0-15 Units (has no  administration in time range)  ondansetron (ZOFRAN) injection 4 mg (4 mg Intravenous Given 09/02/18 2029)  labetalol (NORMODYNE) injection 20 mg (20 mg Intravenous Given 09/02/18 2122)  iohexol (OMNIPAQUE) 300 MG/ML solution 100 mL (100 mLs Intravenous Contrast Given 09/02/18 2115)     Initial Impression / Assessment and Plan / ED Course  I have reviewed the triage vital signs and the nursing notes.  Pertinent labs & imaging results that were available during my care of the patient were reviewed by me and considered in my medical decision making (see chart for details).       Dell Hurtubise is a 33 y.o. female who presents to ED for altered mental status and hyperglycemia today. 56 of the story is obtained by husband via telephone as patient is not very cooperative.  She is answering yes or no and will not give me a clear depiction of the events leading to today's hospital visit.  On exam, she is quite hypertensive.  This appears to be her baseline.  She did say no when asked if she was not taking her medication.  She states that she does not know the name of it for the last time she took it.  Her blood pressure does seem near her baseline from when she was in the hospital last month.  Was given 20 mg of labetalol in ED.  She had some diffuse, generalized abdominal tenderness.  Given her recent admission for enteritis, CT scan was obtained which was negative.  She also had negative head CT and chest x-ray.  Labs all seem fairly reassuring at about her baseline.  Not in DKA.  Dialysis patient he makes very little urine, therefore unable to get UA.  When she is afebrile and has a normal white count with no urinary  symptoms, doubt UTI anyway.  At this juncture, she has had a thorough work-up without any cause for her mental status changes.  She has had several re-evaluations and is now alert and oriented.  I spoke with her husband several times who is concerned that she is not acting herself.  She has  been withdrawn and he is worried that she may be struggling with postpartum depression (19-monthold child at home).  From what he tells me, she was supposed to be getting injections for this, but it was contraindicated with her kidney disease.  It appears she was lost to follow-up with this and it has been untreated.  I have consulted TTS to get their input.  At this point, she is medically cleared with disposition per TTS recommendations.  At shift change, TTS recommendations pending.  Dr. WLeonides Schanzaware of patient and plan of care.  Patient seen by and discussed with Dr. WLeonides Schanzwho agrees with treatment plan.    Final Clinical Impressions(s) / ED Diagnoses   Final diagnoses:  AMS (altered mental status)    ED Discharge Orders    None       Staci Carver, JOzella Almond PA-C 09/03/18 0007    Taris Galindo, KDelice Bison DO 09/03/18 [phone]

## 2018-09-02 NOTE — ED Notes (Signed)
Patient transported to CT 

## 2018-09-02 NOTE — ED Notes (Signed)
Patient transported to CT with RN 

## 2018-09-03 ENCOUNTER — Other Ambulatory Visit (HOSPITAL_COMMUNITY): Payer: Medicaid Other

## 2018-09-03 ENCOUNTER — Encounter (HOSPITAL_COMMUNITY): Payer: Self-pay | Admitting: Internal Medicine

## 2018-09-03 DIAGNOSIS — E103219 Type 1 diabetes mellitus with mild nonproliferative diabetic retinopathy with macular edema, unspecified eye: Secondary | ICD-10-CM | POA: Diagnosis not present

## 2018-09-03 DIAGNOSIS — Z992 Dependence on renal dialysis: Secondary | ICD-10-CM | POA: Diagnosis not present

## 2018-09-03 DIAGNOSIS — I160 Hypertensive urgency: Secondary | ICD-10-CM

## 2018-09-03 DIAGNOSIS — G9340 Encephalopathy, unspecified: Secondary | ICD-10-CM | POA: Diagnosis not present

## 2018-09-03 DIAGNOSIS — N186 End stage renal disease: Secondary | ICD-10-CM

## 2018-09-03 DIAGNOSIS — F23 Brief psychotic disorder: Secondary | ICD-10-CM | POA: Diagnosis not present

## 2018-09-03 HISTORY — DX: Encephalopathy, unspecified: G93.40

## 2018-09-03 HISTORY — DX: Hypertensive urgency: I16.0

## 2018-09-03 LAB — CBC WITH DIFFERENTIAL/PLATELET
Abs Immature Granulocytes: 0.08 10*3/uL — ABNORMAL HIGH (ref 0.00–0.07)
Basophils Absolute: 0 10*3/uL (ref 0.0–0.1)
Basophils Relative: 0 %
Eosinophils Absolute: 0 10*3/uL (ref 0.0–0.5)
Eosinophils Relative: 0 %
HCT: 37.5 % (ref 36.0–46.0)
Hemoglobin: 12.6 g/dL (ref 12.0–15.0)
Immature Granulocytes: 1 %
Lymphocytes Relative: 3 %
Lymphs Abs: 0.5 10*3/uL — ABNORMAL LOW (ref 0.7–4.0)
MCH: 26.6 pg (ref 26.0–34.0)
MCHC: 33.6 g/dL (ref 30.0–36.0)
MCV: 79.1 fL — ABNORMAL LOW (ref 80.0–100.0)
Monocytes Absolute: 0.4 10*3/uL (ref 0.1–1.0)
Monocytes Relative: 3 %
Neutro Abs: 13.5 10*3/uL — ABNORMAL HIGH (ref 1.7–7.7)
Neutrophils Relative %: 93 %
Platelets: 340 10*3/uL (ref 150–400)
RBC: 4.74 MIL/uL (ref 3.87–5.11)
RDW: 15.3 % (ref 11.5–15.5)
WBC: 14.4 10*3/uL — ABNORMAL HIGH (ref 4.0–10.5)
nRBC: 0 % (ref 0.0–0.2)

## 2018-09-03 LAB — RENAL FUNCTION PANEL
Albumin: 3.6 g/dL (ref 3.5–5.0)
Anion gap: 21 — ABNORMAL HIGH (ref 5–15)
BUN: 37 mg/dL — ABNORMAL HIGH (ref 6–20)
CO2: 21 mmol/L — ABNORMAL LOW (ref 22–32)
Calcium: 10 mg/dL (ref 8.9–10.3)
Chloride: 91 mmol/L — ABNORMAL LOW (ref 98–111)
Creatinine, Ser: 9.27 mg/dL — ABNORMAL HIGH (ref 0.44–1.00)
GFR calc Af Amer: 6 mL/min — ABNORMAL LOW (ref >60)
GFR calc non Af Amer: 5 mL/min — ABNORMAL LOW (ref >60)
Glucose, Bld: 359 mg/dL — ABNORMAL HIGH (ref 70–99)
Phosphorus: 3.1 mg/dL (ref 2.5–4.6)
Potassium: 4.4 mmol/L (ref 3.5–5.1)
Sodium: 133 mmol/L — ABNORMAL LOW (ref 135–145)

## 2018-09-03 LAB — SARS CORONAVIRUS 2 BY RT PCR (HOSPITAL ORDER, PERFORMED IN ~~LOC~~ HOSPITAL LAB): SARS Coronavirus 2: NEGATIVE

## 2018-09-03 LAB — I-STAT BETA HCG BLOOD, ED (MC, WL, AP ONLY): I-stat hCG, quantitative: <5 m[IU]/mL (ref <5)

## 2018-09-03 LAB — BASIC METABOLIC PANEL
Anion gap: 21 — ABNORMAL HIGH (ref 5–15)
BUN: 33 mg/dL — ABNORMAL HIGH (ref 6–20)
CO2: 19 mmol/L — ABNORMAL LOW (ref 22–32)
Calcium: 9.8 mg/dL (ref 8.9–10.3)
Chloride: 90 mmol/L — ABNORMAL LOW (ref 98–111)
Creatinine, Ser: 8.87 mg/dL — ABNORMAL HIGH (ref 0.44–1.00)
GFR calc Af Amer: 6 mL/min — ABNORMAL LOW (ref >60)
GFR calc non Af Amer: 5 mL/min — ABNORMAL LOW (ref >60)
Glucose, Bld: 381 mg/dL — ABNORMAL HIGH (ref 70–99)
Potassium: 4.7 mmol/L (ref 3.5–5.1)
Sodium: 130 mmol/L — ABNORMAL LOW (ref 135–145)

## 2018-09-03 LAB — AMMONIA
Ammonia: 26 umol/L (ref 9–35)
Ammonia: 25 umol/L (ref 9–35)

## 2018-09-03 LAB — GLUCOSE, CAPILLARY
Glucose-Capillary: 375 mg/dL — ABNORMAL HIGH (ref 70–99)
Glucose-Capillary: 425 mg/dL — ABNORMAL HIGH (ref 70–99)
Glucose-Capillary: 438 mg/dL — ABNORMAL HIGH (ref 70–99)
Glucose-Capillary: 203 mg/dL — ABNORMAL HIGH (ref 70–99)

## 2018-09-03 LAB — CBG MONITORING, ED: Glucose-Capillary: 357 mg/dL — ABNORMAL HIGH (ref 70–99)

## 2018-09-03 LAB — SEDIMENTATION RATE: Sed Rate: 39 mm/hr — ABNORMAL HIGH (ref 0–22)

## 2018-09-03 LAB — MRSA PCR SCREENING: MRSA by PCR: NEGATIVE

## 2018-09-03 LAB — HEPATIC FUNCTION PANEL
ALT: 13 U/L (ref 0–44)
AST: 16 U/L (ref 15–41)
Albumin: 3.6 g/dL (ref 3.5–5.0)
Alkaline Phosphatase: 130 U/L — ABNORMAL HIGH (ref 38–126)
Bilirubin, Direct: <0.1 mg/dL (ref 0.0–0.2)
Total Bilirubin: 0.5 mg/dL (ref 0.3–1.2)
Total Protein: 7.2 g/dL (ref 6.5–8.1)

## 2018-09-03 LAB — HCG, SERUM, QUALITATIVE: Preg, Serum: NEGATIVE

## 2018-09-03 LAB — HIV ANTIBODY (ROUTINE TESTING W REFLEX): HIV Screen 4th Generation wRfx: NONREACTIVE

## 2018-09-03 MED ORDER — INSULIN REGULAR BOLUS VIA INFUSION
0.0000 [IU] | Freq: Three times a day (TID) | INTRAVENOUS | Status: DC
  Filled 2018-09-03: qty 10

## 2018-09-03 MED ORDER — SODIUM CHLORIDE 0.9 % IV SOLN: 100.0000 mL | INTRAVENOUS | Status: DC | PRN

## 2018-09-03 MED ORDER — VANCOMYCIN HCL 10 G IV SOLR
1500.0000 mg | Freq: Once | INTRAVENOUS | Status: DC
  Filled 2018-09-03: qty 1500

## 2018-09-03 MED ORDER — ACETAMINOPHEN 650 MG RE SUPP: 650.0000 mg | Freq: Four times a day (QID) | RECTAL | Status: DC | PRN

## 2018-09-03 MED ORDER — ONDANSETRON HCL 4 MG PO TABS: 4.0000 mg | ORAL_TABLET | Freq: Four times a day (QID) | ORAL | Status: DC | PRN

## 2018-09-03 MED ORDER — LORAZEPAM 2 MG/ML IJ SOLN
1.0000 mg | Freq: Once | INTRAMUSCULAR | Status: AC
  Administered 2018-09-03: 1 mg via INTRAVENOUS
  Filled 2018-09-03: qty 1

## 2018-09-03 MED ORDER — DEXTROSE-NACL 5-0.45 % IV SOLN: INTRAVENOUS | Status: DC

## 2018-09-03 MED ORDER — INSULIN ASPART 100 UNIT/ML ~~LOC~~ SOLN: 0.0000 [IU] | Freq: Every day | SUBCUTANEOUS | Status: DC

## 2018-09-03 MED ORDER — HYDRALAZINE HCL 20 MG/ML IJ SOLN: 10.0000 mg | INTRAMUSCULAR | Status: DC | PRN

## 2018-09-03 MED ORDER — VANCOMYCIN HCL 10 G IV SOLR
1500.0000 mg | Freq: Once | INTRAVENOUS | Status: AC
  Administered 2018-09-03: 1500 mg via INTRAVENOUS
  Filled 2018-09-03: qty 1500

## 2018-09-03 MED ORDER — HEPARIN SODIUM (PORCINE) 1000 UNIT/ML DIALYSIS
1000.0000 [IU] | INTRAMUSCULAR | Status: DC | PRN
  Filled 2018-09-03: qty 1

## 2018-09-03 MED ORDER — DEXTROSE 50 % IV SOLN: 25.0000 mL | INTRAVENOUS | Status: DC | PRN

## 2018-09-03 MED ORDER — DEXTROSE 5 % IV SOLN
400.0000 mg | INTRAVENOUS | Status: DC
  Filled 2018-09-03: qty 8

## 2018-09-03 MED ORDER — HYDRALAZINE HCL 20 MG/ML IJ SOLN
10.0000 mg | Freq: Once | INTRAMUSCULAR | Status: AC
  Administered 2018-09-03: 10 mg via INTRAVENOUS

## 2018-09-03 MED ORDER — VANCOMYCIN HCL IN DEXTROSE 750-5 MG/150ML-% IV SOLN
750.0000 mg | INTRAVENOUS | Status: DC
  Filled 2018-09-03: qty 150

## 2018-09-03 MED ORDER — TRAZODONE HCL 50 MG PO TABS
50.0000 mg | ORAL_TABLET | Freq: Once | ORAL | Status: AC | PRN
  Administered 2018-09-03: 50 mg via ORAL
  Filled 2018-09-03: qty 1

## 2018-09-03 MED ORDER — ALTEPLASE 2 MG IJ SOLR
2.0000 mg | Freq: Once | INTRAMUSCULAR | Status: DC | PRN
  Filled 2018-09-03: qty 2

## 2018-09-03 MED ORDER — INSULIN ASPART 100 UNIT/ML ~~LOC~~ SOLN
0.0000 [IU] | SUBCUTANEOUS | Status: DC
  Administered 2018-09-03 (×2): 3 [IU] via SUBCUTANEOUS
  Administered 2018-09-03: 9 [IU] via SUBCUTANEOUS

## 2018-09-03 MED ORDER — SODIUM CHLORIDE 0.9 % IV SOLN
2.0000 g | Freq: Two times a day (BID) | INTRAVENOUS | Status: DC
  Administered 2018-09-03 – 2018-09-04 (×2): 2 g via INTRAVENOUS
  Filled 2018-09-03 (×4): qty 20

## 2018-09-03 MED ORDER — HALOPERIDOL LACTATE 5 MG/ML IJ SOLN
INTRAMUSCULAR | Status: AC
  Filled 2018-09-03: qty 1

## 2018-09-03 MED ORDER — SODIUM CHLORIDE 0.9 % IV SOLN
2.0000 g | Freq: Two times a day (BID) | INTRAVENOUS | Status: DC
  Administered 2018-09-03: 2 g via INTRAVENOUS
  Filled 2018-09-03 (×2): qty 20

## 2018-09-03 MED ORDER — INSULIN ASPART 100 UNIT/ML ~~LOC~~ SOLN
5.0000 [IU] | Freq: Once | SUBCUTANEOUS | Status: AC
  Administered 2018-09-03: 5 [IU] via INTRAVENOUS

## 2018-09-03 MED ORDER — DEXTROSE 5 % IV SOLN
400.0000 mg | INTRAVENOUS | Status: DC
  Administered 2018-09-03: 400 mg via INTRAVENOUS
  Filled 2018-09-03 (×2): qty 8

## 2018-09-03 MED ORDER — HYDRALAZINE HCL 20 MG/ML IJ SOLN
10.0000 mg | Freq: Once | INTRAMUSCULAR | Status: AC
  Administered 2018-09-03: 10 mg via INTRAVENOUS
  Filled 2018-09-03: qty 1

## 2018-09-03 MED ORDER — VANCOMYCIN HCL IN DEXTROSE 750-5 MG/150ML-% IV SOLN: 750.0000 mg | INTRAVENOUS | Status: DC

## 2018-09-03 MED ORDER — LABETALOL HCL 5 MG/ML IV SOLN: 10.0000 mg | Freq: Four times a day (QID) | INTRAVENOUS | Status: DC | PRN

## 2018-09-03 MED ORDER — INSULIN ASPART 100 UNIT/ML ~~LOC~~ SOLN
0.0000 [IU] | Freq: Three times a day (TID) | SUBCUTANEOUS | Status: DC
  Administered 2018-09-04: 8 [IU] via SUBCUTANEOUS

## 2018-09-03 MED ORDER — HEPARIN SODIUM (PORCINE) 1000 UNIT/ML IJ SOLN
INTRAMUSCULAR | Status: AC
  Administered 2018-09-03: 5000 [IU] via INTRAVENOUS_CENTRAL
  Filled 2018-09-03: qty 7

## 2018-09-03 MED ORDER — HEPARIN SODIUM (PORCINE) 1000 UNIT/ML DIALYSIS
5000.0000 [IU] | Freq: Once | INTRAMUSCULAR | Status: AC
  Administered 2018-09-03: 5000 [IU] via INTRAVENOUS_CENTRAL
  Filled 2018-09-03: qty 5

## 2018-09-03 MED ORDER — ACETAMINOPHEN 325 MG PO TABS: 650.0000 mg | ORAL_TABLET | Freq: Four times a day (QID) | ORAL | Status: DC | PRN

## 2018-09-03 MED ORDER — CHLORHEXIDINE GLUCONATE CLOTH 2 % EX PADS
6.0000 | MEDICATED_PAD | Freq: Every day | CUTANEOUS | Status: DC
  Administered 2018-09-04: 6 via TOPICAL

## 2018-09-03 MED ORDER — ONDANSETRON HCL 4 MG/2ML IJ SOLN: 4.0000 mg | Freq: Four times a day (QID) | INTRAMUSCULAR | Status: DC | PRN

## 2018-09-03 MED ORDER — HALOPERIDOL LACTATE 5 MG/ML IJ SOLN
2.0000 mg | Freq: Once | INTRAMUSCULAR | Status: AC
  Administered 2018-09-03: 2 mg via INTRAVENOUS

## 2018-09-03 MED ORDER — SODIUM CHLORIDE 0.9 % IV SOLN: INTRAVENOUS | Status: DC

## 2018-09-03 MED ORDER — INSULIN GLARGINE 100 UNIT/ML ~~LOC~~ SOLN
15.0000 [IU] | Freq: Every day | SUBCUTANEOUS | Status: DC
  Administered 2018-09-03: 15 [IU] via SUBCUTANEOUS
  Filled 2018-09-03 (×2): qty 0.15

## 2018-09-03 MED ORDER — INSULIN REGULAR(HUMAN) IN NACL 100-0.9 UT/100ML-% IV SOLN: INTRAVENOUS | Status: DC

## 2018-09-03 MED ORDER — LORAZEPAM 2 MG/ML IJ SOLN: 1.0000 mg | Freq: Once | INTRAMUSCULAR | Status: DC

## 2018-09-03 NOTE — Consult Note (Signed)
                    NEURO HOSPITALIST CONSULT NOTE   Requestig physician: Dr. Danford  Reason for Consult: AMS  History obtained from:    Chart   HPI:                                                                                                                                          Kelly Huff is an 33 y.o. female who presented to the ED after her husband noted her to have hyperglycemia on glucometer at home, in conjunction with incoherent speech and "rambling her words".   ED Triage RN note documents the following: "Pt here via ems for not feeling well, weakness, n/v. High blood sugar @ home. 1540 At home they administered 10 units of insulin. Pt didn't get better so they called EMS. EMS administered 45 mg of zofran. Pt still showing same symptoms. Pts vitals elevated 206/128 pulse 100. Resp 20. CBG in EMS was 447. CBG in ED 359. Pt is a dialysis pt no complications with that."  CT head in the ED showed no acute intracranial abnormality. ABG was with low PCO2 and elevated pH, possibly due to hyperventilation. Na was slightly low at 133. BUN/Cr were consistent with her ESRD. Total Ca was normal, but ionized Ca was slightly low at 1.13. LFTs and ammonia were normal. WBC was normal in the ED but was elevated at 14.4 with this morning's blood draw. ESR is mildly elevated at 39.   Past Medical History:  Diagnosis Date  . Chronic kidney disease   . Diabetes mellitus without complication (HCC)   . History of pre-eclampsia 2016   mild  . Hypertension     Past Surgical History:  Procedure Laterality Date  . CESAREAN SECTION    . DILATION AND CURETTAGE OF UTERUS     x 4    Family History  Problem Relation Age of Onset  . Hypertension Mother   . Cancer Mother        Uterine vs cervical (pt unsure),   . Schizophrenia Brother   . Breast cancer Neg Hx               Social History:  reports that she has never smoked. She has never used smokeless tobacco. She reports that she  does not drink alcohol or use drugs.  Allergies  Allergen Reactions  . Morphine     Other reaction(s): Hallucination  . Sulfamethoxazole-Trimethoprim Hives  . Trulicity [Dulaglutide]     MEDICATIONS:                                                                                                                       Prior to Admission:  Medications Prior to Admission  Medication Sig Dispense Refill Last Dose  . amoxicillin-clavulanate (AUGMENTIN) 500-125 MG tablet Take 1 tablet (500 mg total) by mouth daily. 5 tablet 0   . calcium acetate (PHOSLO) 667 MG capsule Take 667 mg by mouth 3 (three) times daily.    07/26/2018 at Unknown time  . clobetasol ointment (TEMOVATE) 0.05 % Apply 1 application topically 2 (two) times daily as needed for itching.   Past Month at Unknown time  . Insulin Human (INSULIN PUMP) SOLN Inject into the skin continuous. Novolog   07/27/2018 at Unknown time  . metoprolol succinate (TOPROL-XL) 25 MG 24 hr tablet Take 25 mg by mouth daily.  11 07/26/2018 at 0800  . NIFEdipine (PROCARDIA-XL/NIFEDICAL-XL) 30 MG 24 hr tablet Take 2 tablets (60 mg total) by mouth 2 (two) times daily.     . ondansetron (ZOFRAN ODT) 4 MG disintegrating tablet Take 1 tablet (4 mg total) by mouth every 8 (eight) hours as needed for nausea or vomiting. 20 tablet 0   . oxyCODONE (OXY IR/ROXICODONE) 5 MG immediate release tablet Take 1 tablet (5 mg total) by mouth every 4 (four) hours as needed for moderate pain or breakthrough pain. 8 tablet 0   . paricalcitol (ZEMPLAR) 1 MCG capsule Take 1 mcg by mouth daily.   07/26/2018 at Unknown time   Scheduled: . calcium acetate  667 mg Oral TID WC  . Chlorhexidine Gluconate Cloth  6 each Topical Q0600  . insulin aspart  0-9 Units Subcutaneous Q4H  . insulin glargine  15 Units Subcutaneous Daily  . metoprolol succinate  25 mg Oral Daily  . NIFEdipine  60 mg Oral BID  . paricalcitol  1 mcg Oral Daily   Continuous: . sodium chloride    . sodium chloride     . vancomycin Stopped (09/03/18 1054)     ROS:                                                                                                                                       Unable to obtain due to patient being uncooperative versus encephalopathic.   Blood pressure (!) 155/79, pulse (!) 105, temperature 99.1 F (37.3 C), temperature source Oral, resp. rate 11, weight 78.6 kg, SpO2 98 %, unknown if currently breastfeeding.   General Examination:                                                                                                         Physical Exam  HEENT-  [place]/AT Lungs- Respirations unlabored. Extremities- No edema. Warm and well-perfused  Neurological Examination Mental Status: Patient with eyes closed on examiner entering the room, but does not appear to be asleep. Does not open eyes to command initially. After light sternal rub, she immediately opens her eyes. When asked her name or another orientation question she is mute. Does not answer any orientation questions, only murmuring noncommittally in response. When asked to perform commands and name objects she remains unresponsive with eyes open, staring straight ahead. She did resist examiner with her upper extremities after she was coached. Subsequently she did not move her lower extremities to command, but then withdrew them purposefully and briskly to plantar stimulation, exclaiming "ooh, that hurts" with no dysarthria, normal prosody (normal affective expressivity using intonations of voice) and fluent non-halting speech. Did make eye contact briefly with examiner after plantar stimulation. No evidence for pain or discomfort.  Cranial Nerves: II: Blinks to threat in temporal visual fields bilaterally. PERRL.  III,IV, VI: No ptosis. EOMI when tracking examiner, without nystagmus.  V,VII: Face symmetric at baseline. Refuses to smile/grimace. Not cooperative with sensory exam.  VIII: hearing intact to voice IX,X: No  hypophonia XI: Not cooperative with assessment XII: Not cooperative with assessment Motor: Not cooperative with formal assessment, except will resist examiner with upper extremities on testing of arm flexion. Withdraws BLE purposefully and briskly to plantar stimulation. No asymmetry.  Sensory: Reacts to tactile stimulation x 4 Deep Tendon Reflexes: 1+ bilateral brachioradialis and biceps. 0 patellae and achilles. Toes downgoing.  Cerebellar/Gait: Not compliant with testing.    Lab Results: Basic Metabolic Panel: Recent Labs  Lab 09/02/18 1909 09/02/18 1919 09/03/18 0447 09/03/18 1042  NA 131* 132* 130* 133*  K 3.5 3.6 4.7 4.4  CL 96*  --  90* 91*  CO2 20*  --  19* 21*  GLUCOSE 382*  --  381* 359*  BUN 28*  --  33* 37*  CREATININE 8.18*  --  8.87* 9.27*  CALCIUM 9.6  --  9.8 10.0  PHOS  --   --   --  3.1    CBC: Recent Labs  Lab 09/02/18 1909 09/02/18 1919 09/03/18 0447  WBC 10.2  --  14.4*  NEUTROABS 8.6*  --  13.5*  HGB 12.4 12.2 12.6  HCT 36.7 36.0 37.5  MCV 78.4*  --  79.1*  PLT 317  --  340    Cardiac Enzymes: No results for input(s): CKTOTAL, CKMB, CKMBINDEX, TROPONINI in the last 168 hours.  Lipid Panel: No results for input(s): CHOL, TRIG, HDL, CHOLHDL, VLDL, LDLCALC in the last 168 hours.  Imaging: Ct Head Wo Contrast  Result Date: 09/02/2018 CLINICAL DATA:  Altered mental status EXAM: CT HEAD WITHOUT CONTRAST TECHNIQUE: Contiguous axial images were obtained from the base of the skull through the vertex without intravenous contrast. COMPARISON:  None. FINDINGS: Brain: No evidence of acute infarction, hemorrhage, hydrocephalus, extra-axial collection or mass lesion/mass effect. Vascular: No hyperdense vessel or unexpected calcification. Skull: Normal. Negative for fracture or focal lesion. Sinuses/Orbits: No acute finding. Other: None. IMPRESSION: No acute intracranial abnormality noted. Electronically Signed   By: Mark  Lukens M.D.   On: 09/02/2018 20:04    Ct Abdomen Pelvis W Contrast  Result Date: 09/02/2018 CLINICAL DATA:  32-year-old female with abdominal pain, nausea vomiting. EXAM: CT ABDOMEN AND PELVIS WITH CONTRAST TECHNIQUE: Multidetector CT imaging of the abdomen and pelvis was performed using the standard protocol following bolus administration of intravenous   contrast. CONTRAST:  129m OMNIPAQUE IOHEXOL 300 MG/ML  SOLN COMPARISON:  CT of the abdomen pelvis dated 07/27/2018 FINDINGS: Lower chest: There is mild cardiomegaly. The tip of central line noted in the right atrium, likely related to a dialysis catheter. The visualized lung bases are clear. No intra-abdominal free air or free fluid. Hepatobiliary: No focal liver abnormality is seen. No gallstones, gallbladder wall thickening, or biliary dilatation. Pancreas: Unremarkable. No pancreatic ductal dilatation or surrounding inflammatory changes. Spleen: Normal in size without focal abnormality. Adrenals/Urinary Tract: The adrenal glands are unremarkable. There is no hydronephrosis on either side. Subcentimeter right renal hypodense focus is too small to characterize. The visualized ureters and urinary bladder appear unremarkable. Stomach/Bowel: There is no bowel obstruction or active inflammation. The appendix contains appendicoliths otherwise unremarkable. Vascular/Lymphatic: No significant vascular findings are present. No enlarged abdominal or pelvic lymph nodes. Reproductive: Uterus and bilateral adnexa are unremarkable. Other: None Musculoskeletal: No acute osseous pathology. Coarsened and thickened appearance of the left pelvic bone similar to prior study. IMPRESSION: No acute intra-abdominal or pelvic pathology. Interval resolution of the previously seen bowel inflammation. Electronically Signed   By: AAnner CreteM.D.   On: 09/02/2018 21:24   Dg Chest Port 1 View  Result Date: 09/02/2018 CLINICAL DATA:  Altered mental status EXAM: PORTABLE CHEST 1 VIEW COMPARISON:  None. FINDINGS:  Cardiac shadow is at the upper limits of normal in size. Dialysis catheter is noted in satisfactory position. The lungs are clear. No bony abnormality is noted. IMPRESSION: No active disease. Electronically Signed   By: MInez CatalinaM.D.   On: 09/02/2018 19:28    Assessment: 33year old female with ESRD on HD, presenting with encephalopathy versus psychosis versus psychogenic unresponsiveness 1. Had some lab and vitals abnormalities suggestive of a possible organic etiology, including hyperglycemia, HTN, initial symptoms of headache with vomiting and elevated white count.  2. Intermittent normal verbal responses to immediate concerns such as noxious stimuli and at one point stating to a consultant "leave me alone, I just want to go home" are suggestive of a psychogenic etiology as an encephalopathy generally would result in dysarthric, slow speech with loss of prosody and increased latency of responses, none of which manifest in this patient when she does speak.  3. No evidence for clinical seizure activity on exam.  4. No neck stiffness to suggest a meningitis. Not currently febrile. Elevated WBC most likely due to another infectious etiology.  5. CT head shows not acute abnormality.     Recommendations: 1. EEG 2. MRI brain without contrast.  3. Please call neurology after the above tests are completed.  4. Anti-NMDA antibody ordered. Will need to be followed up as outpatient as this is a send-out lab.     Electronically signed: Dr. EKerney Elbe5/12/2018, 11:55 AM

## 2018-09-03 NOTE — ED Provider Notes (Signed)
Medical screening examination/treatment/procedure(s) were conducted as a shared visit with non-physician practitioner(s) and myself.  I personally evaluated the patient during the encounter.  None    Patient is a 33 year old female with history of insulin-dependent diabetes, hypertension, end-stage renal disease on hemodialysis Tuesday, Thursday and Saturday who was last dialyzed on Thursday the seventh who presents to the emergency department with altered mental status.  Husband provides most of the history.  His name is Wafaa Deemer and can be contacted at [phone].  He states that today at 3 PM patient started complaining of a headache.  States she went to lay down.  He reports that she started acting abnormally and he checked her blood sugar and it read "high".  States that she took her blood pressure medications around 340.  States she continued to decompensate.  He states that she has had some postpartum depression recently and being followed by her OB/GYN for this but is not on medication.  She has not expressed suicidal homicidal thoughts and has not had hallucinations.  He does not know of a psychiatric history.  He denies any drug or alcohol use.  States that she has been isolating at home and has not had infectious symptoms but did go and have COVID-19 testing done today in preparation for doing dialysis at home.   Patient here is extremely hypertensive.  This appears to be something that she deals with quite frequently.  This could be contributing to encephalopathy but she has had this previously.  We will continue IV medications for blood pressure control.  She is hyperglycemic without an elevated anion gap but does have slightly low bicarb but this could be from uremia.  BUN is mildly elevated at 28 but I do not think this is what is causing her encephalopathy.  Will give IV insulin.  Has been reports that she has not missed dialysis recently.  Patient is extremely agitated yelling  "help me" repeatedly.  She states "y'all are not helping me".  She is asking to go home.  Patient is unable to tell me why she is here in the emergency department.  She appears very confused, encephalopathic.  No seizure-like activity here or witnessed at home.  I feel she needs medical admission to optimize her blood sugars, blood pressure and monitor her for altered mental status.  Her husband does report that she is been depressed and has a 95-month-old at home.  This could be postpartum psychosis but I feel she needs to be medically optimized with her blood pressure and blood glucose prior to having psychiatry see her.  Her temperature is 99.6 rectally.  She has no meningismus or leukocytosis.  I have low suspicion for meningitis, encephalitis.  Will discuss with hospitalist for their evaluation and potential medical admission.  1:55 AM Discussed patient's case with hospitalist, Dr. Hal Hope.  I have recommended admission and patient (and family if present) agree with this plan. Admitting physician will place admission orders.   I reviewed all nursing notes, vitals, pertinent previous records, EKGs, lab and urine results, imaging (as available).    CRITICAL CARE Performed by: Pryor Curia   Total critical care time: 45 minutes  Critical care time was exclusive of separately billable procedures and treating other patients.  Critical care was necessary to treat or prevent imminent or life-threatening deterioration.  Critical care was time spent personally by me on the following activities: development of treatment plan with patient and/or surrogate as well as nursing, discussions with consultants, evaluation  of patient's response to treatment, examination of patient, obtaining history from patient or surrogate, ordering and performing treatments and interventions, ordering and review of laboratory studies, ordering and review of radiographic studies, pulse oximetry and re-evaluation of  patient's condition.    Ward, Delice Bison, DO 09/03/18 [phone]

## 2018-09-03 NOTE — Progress Notes (Signed)
TTS consult request has been removed. Spoke with Pruett, Jefferey Pica, RN who states the pt is being medically admitted and no longer needs TTS consult.  Lind Covert, MSW, LCSW Therapeutic Triage Specialist  [phone]

## 2018-09-03 NOTE — Progress Notes (Signed)
Pt has arrived to [address] 21. Alert oriented to self only, pt initialy very agitated and anxious, VS stable except for BP being elevated. That was addressed in ED, will recheck BP at appropriate time.  Pt identified appropriately, placed on progressive care monitor an CCMD notified.  Pt unable to cary conversation due to confusion. Belongings assessed and bed alarm in place.  Will continue to monitor and treat pt per MD orders. Pt resting at this time.

## 2018-09-03 NOTE — Progress Notes (Signed)
Put in transport for pt to come down at 8:02am, 8:36 am transport called and said they could not fit pt's bed down the hallway due to orthopedic piece and that pt was too confused and agitated to get in a wheelchair to come down for her MRI. RN aware

## 2018-09-03 NOTE — Progress Notes (Signed)
Pt CBG 425. Lantus 15 units and Novolog 9 units given at 0508. Danford, MD notified.

## 2018-09-03 NOTE — ED Notes (Signed)
ED TO INPATIENT HANDOFF REPORT  ED Nurse Name and Phone #: Donah Driver [phone]  S Name/Age/Gender Kelly Huff 33 y.o. female Room/Bed: 025C/025C  Code Status   Code Status: Full Code  Home/SNF/Other Home Patient oriented to: self Is this baseline? No   Triage Complete: Triage complete  Chief Complaint Dialysis,Hyperglycemia   Triage Note Pt here via ems for not feeling well, weakness, n/v. High blood sugar @ home. 1540 At home they administered 10 units of insulin. Pt didn't get better so they called EMS. EMS administered 45 mg of zofran. Pt still showing same symptoms. Pts vitals elevated 206/128 pulse 100. Resp 20. CBG in EMS was 447. CBG in ED 359. Pt is a dialysis pt no complications with that.    Allergies Allergies  Allergen Reactions  . Morphine     Other reaction(s): Hallucination  . Sulfamethoxazole-Trimethoprim Hives  . Trulicity [Dulaglutide]     Level of Care/Admitting Diagnosis ED Disposition    ED Disposition Condition Comment   Admit  Hospital Area: [facility] [[id number]]  Level of Care: Telemetry Medical [104]  I expect the patient will be discharged within 24 hours: No (not a candidate for 5C-Observation unit)  Covid Evaluation: N/A  Diagnosis: Acute encephalopathy [469629]  Admitting Physician: Rise Patience [phone]  Attending Physician: Rise Patience [[id number]  PT Class (Do Not Modify): Observation [104]  PT Acc Code (Do Not Modify): Observation [10022]       B Medical/Surgery History Past Medical History:  Diagnosis Date  . Chronic kidney disease   . Diabetes mellitus without complication ([place])   . History of pre-eclampsia 2016   mild  . Hypertension    Past Surgical History:  Procedure Laterality Date  . CESAREAN SECTION    . DILATION AND CURETTAGE OF UTERUS     x 4     A IV Location/Drains/Wounds Patient Lines/Drains/Airways Status   Active Line/Drains/Airways    Name:   Placement date:    Placement time:   Site:   Days:   Peripheral IV 09/02/18 Left Antecubital   09/02/18    1902    Antecubital   1   Hemodialysis Catheter Right Subclavian   -    -    Subclavian             Intake/Output Last 24 hours No intake or output data in the 24 hours ending 09/03/18 0158  Labs/Imaging Results for orders placed or performed during the hospital encounter of 09/02/18 (from the past 48 hour(s))  CBG monitoring, ED     Status: Abnormal   Collection Time: 09/02/18  6:56 PM  Result Value Ref Range   Glucose-Capillary 359 (H) 70 - 99 mg/dL   Comment 1 Notify RN    Comment 2 Document in Chart   CBC with Differential     Status: Abnormal   Collection Time: 09/02/18  7:09 PM  Result Value Ref Range   WBC 10.2 4.0 - 10.5 K/uL   RBC 4.68 3.87 - 5.11 MIL/uL   Hemoglobin 12.4 12.0 - 15.0 g/dL   HCT 36.7 36.0 - 46.0 %   MCV 78.4 (L) 80.0 - 100.0 fL   MCH 26.5 26.0 - 34.0 pg   MCHC 33.8 30.0 - 36.0 g/dL   RDW 15.0 11.5 - 15.5 %   Platelets 317 150 - 400 K/uL   nRBC 0.0 0.0 - 0.2 %   Neutrophils Relative % 85 %   Neutro Abs 8.6 (H) 1.7 -  7.7 K/uL   Lymphocytes Relative 10 %   Lymphs Abs 1.0 0.7 - 4.0 K/uL   Monocytes Relative 5 %   Monocytes Absolute 0.5 0.1 - 1.0 K/uL   Eosinophils Relative 0 %   Eosinophils Absolute 0.0 0.0 - 0.5 K/uL   Basophils Relative 0 %   Basophils Absolute 0.0 0.0 - 0.1 K/uL   Immature Granulocytes 0 %   Abs Immature Granulocytes 0.03 0.00 - 0.07 K/uL    Comment: Performed at [facility], [place] [address] [place], [place] [address]  Comprehensive metabolic panel     Status: Abnormal   Collection Time: 09/02/18  7:09 PM  Result Value Ref Range   Sodium 131 (L) 135 - 145 mmol/L   Potassium 3.5 3.5 - 5.1 mmol/L   Chloride 96 (L) 98 - 111 mmol/L   CO2 20 (L) 22 - 32 mmol/L   Glucose, Bld 382 (H) 70 - 99 mg/dL   BUN 28 (H) 6 - 20 mg/dL   Creatinine, Ser 8.18 (H) 0.44 - 1.00 mg/dL   Calcium 9.6 8.9 - 10.3 mg/dL   Total Protein 7.4 6.5 - 8.1 g/dL    Albumin 3.6 3.5 - 5.0 g/dL   AST 19 15 - 41 U/L   ALT 14 0 - 44 U/L   Alkaline Phosphatase 127 (H) 38 - 126 U/L   Total Bilirubin 0.5 0.3 - 1.2 mg/dL   GFR calc non Af Amer 6 (L) >60 mL/min   GFR calc Af Amer 7 (L) >60 mL/min   Anion gap 15 5 - 15    Comment: Performed at [facility], [place] [address], [place], [place] [address]  Ethanol     Status: None   Collection Time: 09/02/18  7:09 PM  Result Value Ref Range   Alcohol, Ethyl (B) <10 <10 mg/dL    Comment: (NOTE) Lowest detectable limit for serum alcohol is 10 mg/dL. For medical purposes only. Performed at [facility], [place] [address], [place], [place] [address]   Salicylate level     Status: None   Collection Time: 09/02/18  7:09 PM  Result Value Ref Range   Salicylate Lvl <2.7 2.8 - 30.0 mg/dL    Comment: Performed at [place] [address] [place], [place] [address]  Acetaminophen level     Status: Abnormal   Collection Time: 09/02/18  7:09 PM  Result Value Ref Range   Acetaminophen (Tylenol), Serum <10 (L) 10 - 30 ug/mL    Comment: (NOTE) Therapeutic concentrations vary significantly. A range of 10-30 ug/mL  may be an effective concentration for many patients. However, some  are best treated at concentrations outside of this range. Acetaminophen concentrations >150 ug/mL at 4 hours after ingestion  and >50 ug/mL at 12 hours after ingestion are often associated with  toxic reactions. Performed at [facility], [place] [address] [place], [place] [address]   POCT I-Stat EG7     Status: Abnormal   Collection Time: 09/02/18  7:19 PM  Result Value Ref Range   pH, Ven 7.589 (H) 7.250 - 7.430   pCO2, Ven 24.8 (L) 44.0 - 60.0 mmHg   pO2, Ven 34.0 32.0 - 45.0 mmHg   Bicarbonate 23.7 20.0 - 28.0 mmol/L   TCO2 24 22 - 32 mmol/L   O2 Saturation 78.0 %   Acid-Base Excess 3.0 (H) 0.0 - 2.0 mmol/L   Sodium 132 (L) 135 - 145 mmol/L   Potassium  3.6 3.5 - 5.1 mmol/L   Calcium, Ion 1.13 (L) 1.15 -  1.40 mmol/L   HCT 36.0 36.0 - 46.0 %   Hemoglobin 12.2 12.0 - 15.0 g/dL   Patient temperature HIDE    Sample type VENOUS    Comment NOTIFIED PHYSICIAN   CBG monitoring, ED     Status: Abnormal   Collection Time: 09/02/18  7:30 PM  Result Value Ref Range   Glucose-Capillary 337 (H) 70 - 99 mg/dL  CBG monitoring, ED     Status: Abnormal   Collection Time: 09/03/18  1:19 AM  Result Value Ref Range   Glucose-Capillary 357 (H) 70 - 99 mg/dL   Ct Head Wo Contrast  Result Date: 09/02/2018 CLINICAL DATA:  Altered mental status EXAM: CT HEAD WITHOUT CONTRAST TECHNIQUE: Contiguous axial images were obtained from the base of the skull through the vertex without intravenous contrast. COMPARISON:  None. FINDINGS: Brain: No evidence of acute infarction, hemorrhage, hydrocephalus, extra-axial collection or mass lesion/mass effect. Vascular: No hyperdense vessel or unexpected calcification. Skull: Normal. Negative for fracture or focal lesion. Sinuses/Orbits: No acute finding. Other: None. IMPRESSION: No acute intracranial abnormality noted. Electronically Signed   By: Inez Catalina M.D.   On: 09/02/2018 20:04   Ct Abdomen Pelvis W Contrast  Result Date: 09/02/2018 CLINICAL DATA:  33 year old female with abdominal pain, nausea vomiting. EXAM: CT ABDOMEN AND PELVIS WITH CONTRAST TECHNIQUE: Multidetector CT imaging of the abdomen and pelvis was performed using the standard protocol following bolus administration of intravenous contrast. CONTRAST:  175mL OMNIPAQUE IOHEXOL 300 MG/ML  SOLN COMPARISON:  CT of the abdomen pelvis dated 07/27/2018 FINDINGS: Lower chest: There is mild cardiomegaly. The tip of central line noted in the right atrium, likely related to a dialysis catheter. The visualized lung bases are clear. No intra-abdominal free air or free fluid. Hepatobiliary: No focal liver abnormality is seen. No gallstones, gallbladder wall thickening, or biliary dilatation. Pancreas: Unremarkable. No pancreatic  ductal dilatation or surrounding inflammatory changes. Spleen: Normal in size without focal abnormality. Adrenals/Urinary Tract: The adrenal glands are unremarkable. There is no hydronephrosis on either side. Subcentimeter right renal hypodense focus is too small to characterize. The visualized ureters and urinary bladder appear unremarkable. Stomach/Bowel: There is no bowel obstruction or active inflammation. The appendix contains appendicoliths otherwise unremarkable. Vascular/Lymphatic: No significant vascular findings are present. No enlarged abdominal or pelvic lymph nodes. Reproductive: Uterus and bilateral adnexa are unremarkable. Other: None Musculoskeletal: No acute osseous pathology. Coarsened and thickened appearance of the left pelvic bone similar to prior study. IMPRESSION: No acute intra-abdominal or pelvic pathology. Interval resolution of the previously seen bowel inflammation. Electronically Signed   By: Anner Crete M.D.   On: 09/02/2018 21:24   Dg Chest Port 1 View  Result Date: 09/02/2018 CLINICAL DATA:  Altered mental status EXAM: PORTABLE CHEST 1 VIEW COMPARISON:  None. FINDINGS: Cardiac shadow is at the upper limits of normal in size. Dialysis catheter is noted in satisfactory position. The lungs are clear. No bony abnormality is noted. IMPRESSION: No active disease. Electronically Signed   By: Inez Catalina M.D.   On: 09/02/2018 19:28    Pending Labs Unresulted Labs (From admission, onward)    Start     Ordered   09/03/18 0044  SARS Coronavirus 2 [facility] order, Performed in [facility] hospital lab)  (Novel Coronavirus, NAA ([place]))  Once,   R     09/03/18 0044   09/02/18 1907  Blood gas, venous  Once,  STAT     09/02/18 1907   09/02/18 1907  Urinalysis, Routine w reflex microscopic  ONCE - STAT,   STAT     09/02/18 1907   09/02/18 1907  Urine rapid drug screen (hosp performed)  ONCE - STAT,   STAT     09/02/18 1907          Vitals/Pain Today's Vitals    09/03/18 0039 09/03/18 0112 09/03/18 0130 09/03/18 0146  BP: (!) 196/124 (!) 196/90 (!) 218/113 (!) 218/113  Pulse: 96 85 96   Resp: (!) 22 11 13    Temp:      TempSrc:      SpO2: 100% 99% 100%   PainSc:        Isolation Precautions No active isolations  Medications Medications  calcium acetate (PHOSLO) capsule 667 mg (has no administration in time range)  clobetasol ointment (TEMOVATE) 5.88 % 1 application (has no administration in time range)  metoprolol succinate (TOPROL-XL) 24 hr tablet 25 mg (has no administration in time range)  NIFEdipine (PROCARDIA XL/NIFEDICAL XL) 24 hr tablet 60 mg (60 mg Oral Given 09/03/18 0050)  ondansetron (ZOFRAN-ODT) disintegrating tablet 4 mg (has no administration in time range)  paricalcitol (ZEMPLAR) capsule 1 mcg (has no administration in time range)  insulin aspart (novoLOG) injection 0-15 Units (has no administration in time range)  ondansetron (ZOFRAN) injection 4 mg (4 mg Intravenous Given 09/02/18 2029)  labetalol (NORMODYNE) injection 20 mg (20 mg Intravenous Given 09/02/18 2122)  iohexol (OMNIPAQUE) 300 MG/ML solution 100 mL (100 mLs Intravenous Contrast Given 09/02/18 2115)  LORazepam (ATIVAN) injection 1 mg (1 mg Intravenous Given 09/03/18 0106)  haloperidol lactate (HALDOL) injection 2 mg (2 mg Intravenous Given 09/03/18 0106)  hydrALAZINE (APRESOLINE) injection 10 mg (10 mg Intravenous Given 09/03/18 0146)  insulin aspart (novoLOG) injection 5 Units (5 Units Intravenous Given 09/03/18 0149)    Mobility walks with person assist     Focused Assessments    R Recommendations: See Admitting Provider Note  Report given to:   Additional Notes: pt repeativ

## 2018-09-03 NOTE — Progress Notes (Signed)
PROGRESS NOTE    [name]  JKD:[id number] DOB: Apr 04, 1986 DOA: 09/02/2018 PCP: Rubie Maid, MD      Brief Narrative:  Kelly Huff is a 33 y.o. 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  Had been normal until day of admission, when sugars were high, BP high.  That afternoon, developed headache, then vomited.  At that point, it became clear that she was confused and disoriented, so her husband brought her to the ER.  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  CT head and abdomen negative in ER.  Required Haldol and lorazepam for sedation.       Assessment & Plan:  Encephalopathy Differential includes PRES, acute psychosis, less likely SLE psychosis, meningitis (increased risk with Eculizumab, although no signs of fever), paraneoplastic syndrome, seizure.  Sister with schizophrenia.  -Empiric vancomycin and CTX and acyclovir -Obtain LP -Obtain MRI brain without contrast -Consult Neurology -Obtain NMDA antibodies, ANA, dsDNA Abs, complements   Diabetes -Continue Lantus -Continue sliding scale corrections q4hrs  Hypertensive urgency  BPs marginally better -Continue nifedipine, metoprolol -Continue PRN hydralazine -Add PRN labetalol  ESRD -Consult Nephrology, appreciate cares -Continue Phoslo, vitD analog  Hyponatremia Mild, asymptomatic      DVT prophylaxis: SCDs Code Status: FULL Family Communication: Husband MDM and disposition Plan: This is a no charge note.  For further details, please see H&P by my partner Dr. Hal Hope from earlier today.  The below labs and imaging reports were reviewed and summarized above.    The patient was  admitted with encephalopathy.    Objective: Vitals:   09/03/18 1430 09/03/18 1500 09/03/18 1530 09/03/18 1540  BP: (!) 182/105 (!) 166/91 (!) 157/94 (!) 164/94  Pulse: (!) 107 (!) 106 (!) 106 100  Resp:    18  Temp:    98.9 F (37.2 C)  TempSrc:    Oral  SpO2:    98%  Weight:    76.2 kg    Intake/Output Summary (Last 24 hours) at 09/03/2018 1649 Last data filed at 09/03/2018 1540 Gross per 24 hour  Intake 74.21 ml  Output 1500 ml  Net -1425.79 ml   Filed Weights   09/03/18 0500 09/03/18 1205 09/03/18 1540  Weight: 78.6 kg 77.8 kg 76.2 kg    Examination: The patient was seen and examined.      Data Reviewed: I have personally reviewed following labs and imaging studies:  CBC: Recent Labs  Lab 09/02/18 1909 09/02/18 1919 09/03/18 0447  WBC 10.2  --  14.4*  NEUTROABS 8.6*  --  13.5*  HGB 12.4 12.2 12.6  HCT 36.7 36.0 37.5  MCV 78.4*  --  79.1*  PLT 317  --  099   Basic Metabolic Panel: Recent Labs  Lab 09/02/18 1909 09/02/18 1919 09/03/18 0447 09/03/18 1042  NA 131* 132* 130* 133*  K 3.5 3.6 4.7 4.4  CL 96*  --  90* 91*  CO2 20*  --  19* 21*  GLUCOSE 382*  --  381* 359*  BUN 28*  --  33* 37*  CREATININE 8.18*  --  8.87* 9.27*  CALCIUM 9.6  --  9.8 10.0  PHOS  --   --   --  3.1   GFR: Estimated Creatinine Clearance: 9.1 mL/min (A) (by C-G formula based on SCr of 9.27 mg/dL (H)). Liver Function Tests: Recent Labs  Lab 09/02/18 1909 09/03/18 0447 09/03/18 1042  AST 19 16  --   ALT 14 13  --   ALKPHOS 127* 130*  --   BILITOT 0.5 0.5  --   PROT 7.4 7.2  --   ALBUMIN 3.6 3.6 3.6   No results for input(s): LIPASE, AMYLASE in the last 168 hours. Recent Labs  Lab 09/03/18 0223 09/03/18 1042  AMMONIA 26 25   Coagulation Profile: No results for input(s): INR, PROTIME in the last 168 hours. Cardiac Enzymes: No results for input(s): CKTOTAL, CKMB, CKMBINDEX, TROPONINI in the last 168 hours. BNP (last 3 results) No results for input(s): PROBNP  in the last 8760 hours. HbA1C: No results for input(s): HGBA1C in the last 72 hours. CBG: Recent Labs  Lab 09/02/18 1930 09/03/18 0119 09/03/18 0451 09/03/18 0707 09/03/18 0712  GLUCAP 337* 357* 375* 438* 425*   Lipid Profile: No results for input(s): CHOL, HDL, LDLCALC, TRIG, CHOLHDL, LDLDIRECT in the last 72 hours. Thyroid Function Tests: No results for input(s): TSH, T4TOTAL, FREET4, T3FREE, THYROIDAB in the last 72 hours. Anemia Panel: No results for input(s): VITAMINB12, FOLATE, FERRITIN, TIBC, IRON, RETICCTPCT in the last 72 hours. Urine analysis:    Component Value Date/Time   COLORURINE YELLOW 07/28/2018 0609   APPEARANCEUR CLOUDY (A) 07/28/2018 0609   APPEARANCEUR Clear 06/25/2017 1042   LABSPEC 1.024 07/28/2018 0609   PHURINE 5.0 07/28/2018 0609   GLUCOSEU 150 (A) 07/28/2018 0609   HGBUR SMALL (A) 07/28/2018 0609   BILIRUBINUR NEGATIVE 07/28/2018 0609   BILIRUBINUR neg 08/05/2017 1514   BILIRUBINUR Negative 06/25/2017 1042   KETONESUR NEGATIVE 07/28/2018 0609   PROTEINUR 100 (A) 07/28/2018 0609   UROBILINOGEN 0.2 08/05/2017 1514   NITRITE NEGATIVE 07/28/2018 0609   LEUKOCYTESUR NEGATIVE 07/28/2018 0609   Sepsis Labs: @LABRCNTIP (procalcitonin:4,lacticacidven:4)  ) Recent Results (from the past 240 hour(s))  SARS Coronavirus 2 [facility] order, Performed in [place] hospital lab)     Status: None   Collection Time: 09/03/18 12:44 AM  Result Value Ref Range Status   SARS Coronavirus 2 NEGATIVE NEGATIVE Final    Comment: (NOTE) If result is NEGATIVE SARS-CoV-2 target nucleic acids are NOT DETECTED. The SARS-CoV-2 RNA is generally detectable in upper and lower  respiratory specimens during the acute phase of infection. The lowest  concentration of SARS-CoV-2 viral copies this assay can detect is 250  copies / mL. A negative result does not preclude SARS-CoV-2 infection  and should not be used as the sole basis for treatment or other  patient management  decisions.  A negative result may occur with  improper specimen collection / handling, submission of specimen other  than nasopharyngeal swab, presence of viral mutation(s) within the  areas targeted by this assay, and inadequate number of viral copies  (<250 copies / mL). A negative result must be combined with clinical  observations, patient history, and epidemiological information. If result is POSITIVE SARS-CoV-2 target nucleic acids are DETECTED. The SARS-CoV-2 RNA is generally detectable in upper and lower  respiratory specimens dur ing the acute phase of infection.  Positive  results are indicative of active infection with SARS-CoV-2.  Clinical  correlation with patient history and other diagnostic information is  necessary to determine patient infection status.  Positive results do  not rule out bacterial infection or co-infection with other viruses. If result  is PRESUMPTIVE POSTIVE SARS-CoV-2 nucleic acids MAY BE PRESENT.   A presumptive positive result was obtained on the submitted specimen  and confirmed on repeat testing.  While 2019 novel coronavirus  (SARS-CoV-2) nucleic acids may be present in the submitted sample  additional confirmatory testing may be necessary for epidemiological  and / or clinical management purposes  to differentiate between  SARS-CoV-2 and other Sarbecovirus currently known to infect humans.  If clinically indicated additional testing with an alternate test  methodology [id number]) is advised. The SARS-CoV-2 RNA is generally  detectable in upper and lower respiratory sp ecimens during the acute  phase of infection. The expected result is Negative. Fact Sheet for Patients:  StrictlyIdeas.no Fact Sheet for Healthcare Providers: BankingDealers.co.za This test is not yet approved or cleared by the [place] FDA and has been authorized for detection and/or diagnosis of SARS-CoV-2 by FDA under an  Emergency Use Authorization (EUA).  This EUA will remain in effect (meaning this test can be used) for the duration of the COVID-19 declaration under Section 564(b)(1) of the Act, 21 U.S.C. section 360bbb-3(b)(1), unless the authorization is terminated or revoked sooner. Performed at [facility], [place] [address] [place], [place] [address]   MRSA PCR Screening     Status: None   Collection Time: 09/03/18  6:13 AM  Result Value Ref Range Status   MRSA by PCR NEGATIVE NEGATIVE Final    Comment:        The GeneXpert MRSA Assay (FDA approved for NASAL specimens only), is one component of a comprehensive MRSA colonization surveillance program. It is not intended to diagnose MRSA infection nor to guide or monitor treatment for MRSA infections. Performed at [facility], [place] [address], [place], [place] [address]          Radiology Studies: Ct Head Wo Contrast  Result Date: 09/02/2018 CLINICAL DATA:  Altered mental status EXAM: CT HEAD WITHOUT CONTRAST TECHNIQUE: Contiguous axial images were obtained from the base of the skull through the vertex without intravenous contrast. COMPARISON:  None. FINDINGS: Brain: No evidence of acute infarction, hemorrhage, hydrocephalus, extra-axial collection or mass lesion/mass effect. Vascular: No hyperdense vessel or unexpected calcification. Skull: Normal. Negative for fracture or focal lesion. Sinuses/Orbits: No acute finding. Other: None. IMPRESSION: No acute intracranial abnormality noted. Electronically Signed   By: Inez Catalina M.D.   On: 09/02/2018 20:04   Ct Abdomen Pelvis W Contrast  Result Date: 09/02/2018 CLINICAL DATA:  33 year old female with abdominal pain, nausea vomiting. EXAM: CT ABDOMEN AND PELVIS WITH CONTRAST TECHNIQUE: Multidetector CT imaging of the abdomen and pelvis was performed using the standard protocol following bolus administration of intravenous contrast. CONTRAST:  119mL OMNIPAQUE IOHEXOL 300 MG/ML  SOLN  COMPARISON:  CT of the abdomen pelvis dated 07/27/2018 FINDINGS: Lower chest: There is mild cardiomegaly. The tip of central line noted in the right atrium, likely related to a dialysis catheter. The visualized lung bases are clear. No intra-abdominal free air or free fluid. Hepatobiliary: No focal liver abnormality is seen. No gallstones, gallbladder wall thickening, or biliary dilatation. Pancreas: Unremarkable. No pancreatic ductal dilatation or surrounding inflammatory changes. Spleen: Normal in size without focal abnormality. Adrenals/Urinary Tract: The adrenal glands are unremarkable. There is no hydronephrosis on either side. Subcentimeter right renal hypodense focus is too small to characterize. The visualized ureters and urinary bladder appear unremarkable. Stomach/Bowel: There is no bowel obstruction or active inflammation. The appendix contains appendicoliths otherwise unremarkable. Vascular/Lymphatic: No significant vascular findings are present.  No enlarged abdominal or pelvic lymph nodes. Reproductive: Uterus and bilateral adnexa are unremarkable. Other: None Musculoskeletal: No acute osseous pathology. Coarsened and thickened appearance of the left pelvic bone similar to prior study. IMPRESSION: No acute intra-abdominal or pelvic pathology. Interval resolution of the previously seen bowel inflammation. Electronically Signed   By: Anner Crete M.D.   On: 09/02/2018 21:24   Dg Chest Port 1 View  Result Date: 09/02/2018 CLINICAL DATA:  Altered mental status EXAM: PORTABLE CHEST 1 VIEW COMPARISON:  None. FINDINGS: Cardiac shadow is at the upper limits of normal in size. Dialysis catheter is noted in satisfactory position. The lungs are clear. No bony abnormality is noted. IMPRESSION: No active disease. Electronically Signed   By: Inez Catalina M.D.   On: 09/02/2018 19:28        Scheduled Meds: . calcium acetate  667 mg Oral TID WC  . Chlorhexidine Gluconate Cloth  6 each Topical Q0600  .  insulin aspart  0-9 Units Subcutaneous Q4H  . insulin glargine  15 Units Subcutaneous Daily  . metoprolol succinate  25 mg Oral Daily  . NIFEdipine  60 mg Oral BID  . paricalcitol  1 mcg Oral Daily   Continuous Infusions: . vancomycin Stopped (09/03/18 1054)     LOS: 0 days    Time spent: 25 minutes    Edwin Dada, MD [facility] 09/03/2018, 4:49 PM     Pager [phone] --- please page though AMION:  [email or web address] Password [email or web address] If 7PM-7AM, please contact night-coverage

## 2018-09-03 NOTE — Progress Notes (Signed)
Attempted to get pt for MRI at 8:15p but she needed meds for claustrophobia. RN called back to say she had meds ordered but we had already sent for another pt. I just spoke to the rn again and she states the pt wants to go to sleep for the night and will do MRI in the am 5/10.

## 2018-09-03 NOTE — Progress Notes (Signed)
Pharmacy Antibiotic Note  Caroljean Monsivais is a 33 y.o. female admitted on 09/02/2018 with AMS/poss meningitis.  Pharmacy has been consulted for Vancomycin and Acyclovir dosing.  Plan: Vancomycin 1500 mg IV now, then 750 mg IV after each HD Acyclovir 400 mg IV now, then q24h after hemodialysis  Weight: 173 lb 4.5 oz (78.6 kg)  Temp (24hrs), Avg:98.6 F (37 C), Min:97.7 F (36.5 C), Max:99.1 F (37.3 C)  Recent Labs  Lab 09/02/18 1909 09/03/18 0447  WBC 10.2 14.4*  CREATININE 8.18* 8.87*    Estimated Creatinine Clearance: 9.6 mL/min (A) (by C-G formula based on SCr of 8.87 mg/dL (H)).    Allergies  Allergen Reactions  . Morphine     Other reaction(s): Hallucination  . Sulfamethoxazole-Trimethoprim Hives  . Trulicity [Dulaglutide]     Caryl Pina 09/03/2018 7:15 AM

## 2018-09-03 NOTE — Progress Notes (Signed)
Pt states she is ready to go to sleep and requesting to have MRI done in the morning. Notified MRI of pt's request and they stated that next shift comes on at 0800 in the morning. Will continue to monitor pt. Ranelle Oyster, RN

## 2018-09-03 NOTE — Consult Note (Addendum)
[facility] Renal Consultation Note    Indication for Consultation:  Management of ESRD/hemodialysis; anemia, hypertension/volume and secondary hyperparathyroidism PCP: Dr. Rubie Maid Oncology-Dr. Mayra Neer  HPI: Kelly Huff is a 33 y.o. female on hemodialysis T,Th,S at [facility]. 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 Started HD 05/2018 at [facility] where she was formerly followed prior to starting HD. She has 3 children, ages 25 years, 2 years and 34 months old. H/O C-section, pre-eclampsia.   Apparently patient found with AMS at home asking for help, C/O headache and vomiting. She was brought to ED 09/02/2018 for evaluation. Last hemodialysis 09/01/2018, ran 3:13 hrs or 3.5 hour treatment. Upon arrival to ED, she was noted to be hypertensive, initially afebile 97.7. Later temperature climbed to 99.1. WBC 10.2 HGB 12.4 PLT 317, BS 382 AG 15 K+ 3.5. CXR unremarkable. CT of head negative. Patient was treated for hypertensive urgency with IV Labetalol and Hydralazine. Ativan and Haldol IV for agitation.BC were drawn, she was started on empiric ABX. Today WBC 14.4 BS 381 AG 21 CO2 19. She opens eyes to verbal stimuli-says "uh hum" to most questions, keeps repeating she needs help but does not say for what or in what way. Then says, "leave me alone, I just want to go home". Unfortunately family not available for provide HPI. Rest of HPI gathered from EMR.   Past Medical History:  Diagnosis Date  . Chronic kidney disease   . Diabetes mellitus without complication ([place])   . History of pre-eclampsia 2016   mild  . Hypertension    Past Surgical History:  Procedure Laterality Date  . CESAREAN SECTION    . DILATION AND CURETTAGE OF UTERUS     x 4   Family History  Problem Relation Age of Onset  . Hypertension Mother   . Cancer Mother        Uterine vs cervical (pt unsure),   . Schizophrenia  Brother   . Breast cancer Neg Hx    Social History:  reports that she has never smoked. She has never used smokeless tobacco. She reports that she does not drink alcohol or use drugs. Allergies  Allergen Reactions  . Morphine     Other reaction(s): Hallucination  . Sulfamethoxazole-Trimethoprim Hives  . Trulicity [Dulaglutide]    Prior to Admission medications   Medication Sig Start Date End Date Taking? Authorizing Provider  amoxicillin-clavulanate (AUGMENTIN) 500-125 MG tablet Take 1 tablet (500 mg total) by mouth daily. 07/29/18   Geradine Girt, DO  calcium acetate (PHOSLO) 667 MG capsule Take 667 mg by mouth 3 (three) times daily.  11/26/17 11/26/18  [provider]  clobetasol ointment (TEMOVATE) 2.67 % Apply 1 application topically 2 (two) times daily as needed for itching. 03/07/18   [provider]  Insulin Human (INSULIN PUMP) SOLN Inject into the skin continuous. Novolog    [provider]  metoprolol succinate (TOPROL-XL) 25 MG 24 hr tablet Take 25 mg by mouth daily. 02/14/18   [provider]  NIFEdipine (PROCARDIA-XL/NIFEDICAL-XL) 30 MG 24 hr tablet Take 2 tablets (60 mg total) by mouth 2 (two) times daily. 07/29/18   Geradine Girt, DO  ondansetron (ZOFRAN ODT) 4 MG disintegrating tablet Take 1 tablet (4 mg total) by mouth every 8 (eight) hours as needed for nausea or vomiting. 07/29/18   Geradine Girt, DO  oxyCODONE (OXY IR/ROXICODONE) 5 MG immediate release tablet Take 1 tablet (5  mg total) by mouth every 4 (four) hours as needed for moderate pain or breakthrough pain. 07/29/18   Geradine Girt, DO  paricalcitol (ZEMPLAR) 1 MCG capsule Take 1 mcg by mouth daily.    [provider]   Current Facility-Administered Medications  Medication Dose Route Frequency Provider Last Rate Last Dose  . acetaminophen (TYLENOL) tablet 650 mg  650 mg Oral Q6H PRN Rise Patience, MD       Or  . acetaminophen (TYLENOL) suppository 650 mg  650 mg  Rectal Q6H PRN Rise Patience, MD      . acyclovir (ZOVIRAX) 400 mg in dextrose 5 % 100 mL IVPB  400 mg Intravenous Q24H Danford, Suann Larry, MD      . calcium acetate (PHOSLO) capsule 667 mg  667 mg Oral TID WC Rise Patience, MD      . cefTRIAXone (ROCEPHIN) 2 g in sodium chloride 0.9 % 100 mL IVPB  2 g Intravenous Q12H Rise Patience, MD 200 mL/hr at 09/03/18 1028 2 g at 09/03/18 1028  . Chlorhexidine Gluconate Cloth 2 % PADS 6 each  6 each Topical Q0600 Valentina Gu, NP      . clobetasol ointment (TEMOVATE) 5.85 % 1 application  1 application Topical BID PRN Rise Patience, MD      . hydrALAZINE (APRESOLINE) injection 10 mg  10 mg Intravenous Q4H PRN Rise Patience, MD      . insulin aspart (novoLOG) injection 0-9 Units  0-9 Units Subcutaneous Q4H Rise Patience, MD   9 Units at 09/03/18 [phone]  . insulin glargine (LANTUS) injection 15 Units  15 Units Subcutaneous Daily Rise Patience, MD   15 Units at 09/03/18 2423  . metoprolol succinate (TOPROL-XL) 24 hr tablet 25 mg  25 mg Oral Daily Rise Patience, MD      . NIFEdipine (PROCARDIA XL/NIFEDICAL XL) 24 hr tablet 60 mg  60 mg Oral BID Rise Patience, MD   60 mg at 09/03/18 0050  . ondansetron (ZOFRAN) tablet 4 mg  4 mg Oral Q6H PRN Rise Patience, MD       Or  . ondansetron [facility]) injection 4 mg  4 mg Intravenous Q6H PRN Rise Patience, MD      . paricalcitol (ZEMPLAR) capsule 1 mcg  1 mcg Oral Daily Rise Patience, MD      . vancomycin (VANCOCIN) 1,500 mg in sodium chloride 0.9 % 500 mL IVPB  1,500 mg Intravenous Once Danford, Suann Larry, MD      . vancomycin (VANCOCIN) IVPB 750 mg/150 ml premix  750 mg Intravenous Q T,Th,Sa-HD Edwin Dada, MD       Labs: Basic Metabolic Panel: Recent Labs  Lab 09/02/18 1909 09/02/18 1919 09/03/18 0447  NA 131* 132* 130*  K 3.5 3.6 4.7  CL 96*  --  90*  CO2 20*  --  19*  GLUCOSE 382*  --  381*  BUN 28*   --  33*  CREATININE 8.18*  --  8.87*  CALCIUM 9.6  --  9.8   Liver Function Tests: Recent Labs  Lab 09/02/18 1909 09/03/18 0447  AST 19 16  ALT 14 13  ALKPHOS 127* 130*  BILITOT 0.5 0.5  PROT 7.4 7.2  ALBUMIN 3.6 3.6   No results for input(s): LIPASE, AMYLASE in the last 168 hours. Recent Labs  Lab 09/03/18 0223  AMMONIA 26   CBC: Recent Labs  Lab 09/02/18 1909  09/02/18 1919 09/03/18 0447  WBC 10.2  --  14.4*  NEUTROABS 8.6*  --  13.5*  HGB 12.4 12.2 12.6  HCT 36.7 36.0 37.5  MCV 78.4*  --  79.1*  PLT 317  --  340   Cardiac Enzymes: No results for input(s): CKTOTAL, CKMB, CKMBINDEX, TROPONINI in the last 168 hours. CBG: Recent Labs  Lab 09/02/18 1930 09/03/18 0119 09/03/18 0451 09/03/18 0707 09/03/18 0712  GLUCAP 337* 357* 375* 438* 425*   Iron Studies: No results for input(s): IRON, TIBC, TRANSFERRIN, FERRITIN in the last 72 hours. Studies/Results: Ct Head Wo Contrast  Result Date: 09/02/2018 CLINICAL DATA:  Altered mental status EXAM: CT HEAD WITHOUT CONTRAST TECHNIQUE: Contiguous axial images were obtained from the base of the skull through the vertex without intravenous contrast. COMPARISON:  None. FINDINGS: Brain: No evidence of acute infarction, hemorrhage, hydrocephalus, extra-axial collection or mass lesion/mass effect. Vascular: No hyperdense vessel or unexpected calcification. Skull: Normal. Negative for fracture or focal lesion. Sinuses/Orbits: No acute finding. Other: None. IMPRESSION: No acute intracranial abnormality noted. Electronically Signed   By: Inez Catalina M.D.   On: 09/02/2018 20:04   Ct Abdomen Pelvis W Contrast  Result Date: 09/02/2018 CLINICAL DATA:  33 year old female with abdominal pain, nausea vomiting. EXAM: CT ABDOMEN AND PELVIS WITH CONTRAST TECHNIQUE: Multidetector CT imaging of the abdomen and pelvis was performed using the standard protocol following bolus administration of intravenous contrast. CONTRAST:  140mL OMNIPAQUE  IOHEXOL 300 MG/ML  SOLN COMPARISON:  CT of the abdomen pelvis dated 07/27/2018 FINDINGS: Lower chest: There is mild cardiomegaly. The tip of central line noted in the right atrium, likely related to a dialysis catheter. The visualized lung bases are clear. No intra-abdominal free air or free fluid. Hepatobiliary: No focal liver abnormality is seen. No gallstones, gallbladder wall thickening, or biliary dilatation. Pancreas: Unremarkable. No pancreatic ductal dilatation or surrounding inflammatory changes. Spleen: Normal in size without focal abnormality. Adrenals/Urinary Tract: The adrenal glands are unremarkable. There is no hydronephrosis on either side. Subcentimeter right renal hypodense focus is too small to characterize. The visualized ureters and urinary bladder appear unremarkable. Stomach/Bowel: There is no bowel obstruction or active inflammation. The appendix contains appendicoliths otherwise unremarkable. Vascular/Lymphatic: No significant vascular findings are present. No enlarged abdominal or pelvic lymph nodes. Reproductive: Uterus and bilateral adnexa are unremarkable. Other: None Musculoskeletal: No acute osseous pathology. Coarsened and thickened appearance of the left pelvic bone similar to prior study. IMPRESSION: No acute intra-abdominal or pelvic pathology. Interval resolution of the previously seen bowel inflammation. Electronically Signed   By: Anner Crete M.D.   On: 09/02/2018 21:24   Dg Chest Port 1 View  Result Date: 09/02/2018 CLINICAL DATA:  Altered mental status EXAM: PORTABLE CHEST 1 VIEW COMPARISON:  None. FINDINGS: Cardiac shadow is at the upper limits of normal in size. Dialysis catheter is noted in satisfactory position. The lungs are clear. No bony abnormality is noted. IMPRESSION: No active disease. Electronically Signed   By: Inez Catalina M.D.   On: 09/02/2018 19:28    ROS: As per HPI otherwise negative.   Physical Exam: Vitals:   09/03/18 0500 09/03/18 0507  09/03/18 0620 09/03/18 0627  BP:  (!) 155/79    Pulse:      Resp:  (!) 28 10 11   Temp:      TempSrc:      SpO2:      Weight: 78.6 kg        General: Well developed, well nourished, in no  acute distress. Head: Normocephalic, atraumatic, sclera non-icteric, mucus membranes are moist Neck: Supple. JVD not elevated. Lungs: Clear bilaterally to auscultation without wheezes, rales, or rhonchi. Breathing is unlabored. Heart: RRR with S1 S2. No murmurs, rubs, or gallops appreciated. Abdomen: Soft, non-tender, non-distended with normoactive bowel sounds. No rebound/guarding. No obvious abdominal masses. M-S:  Strength and tone appear normal for age. Lower extremities:without edema or ischemic changes, no open wounds  Neuro: Alert and oriented X 1. Moves all extremities spontaneously but does not follow commands. . Psych:  Alternating from lethargy to agitation. Not answering questions appropriately  Dialysis Access: RIJ TDC drsg CDI.   Dialysis Orders: [place] T,Th,S 3.5 hr 180NRe 400/800 79 kg 2.0K/ 2.5 Ca UFP 4 -Heparin 5000 units IV initial bolus, Heparin 2000 units IV mid run -Aranesp 100 mcg IV weekly (last dose 09/01/18 Last HGB 11.6 09/01/2018) -Hectorol 3 mcg IV TIW (last PTH 256 08/18/18 Last phos 6.3 Ca 8.8 09/01/2018)   Assessment/Plan: 1.  AMS-Unknown etiology. Differential includes hypertensive encephalopathy, infection, psychiatric issues. MRI ordered. Per primary. 2. Leukocytosis/low grade temp-COVID 19 negative. BC drawn/pending. Started on Vanc, rocephin, acyclovir per primary.  3. Hypertensive Urgency-BP looks better at present-155/79. Metoprolol succ 50mg  daily and nifedipine 60 mg PO BID on OP med list. Will attempt to lower volume in HD, hopefully lower BP. Not taking oral meds at present.  4.  DKA-Co2 19 BS 357-425. No insulin/DKA protocol yet. Hold IVF in DKA protocol. Per primary 5.  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 Rogue Bussing. Considering switching to Ultimoris  infusions.   6.  ESRD - T,Th,S HD today on schedule. K+ 4.7 Usual bath and heparin.  7.  Hypertension/volume  - Please see # 2. Does not appear overtly volume overloaded by exam or chest Xray.  8.  Anemia  - Managed by nephrology. HGB 12.6, Recent ESA dose. Follow HGB.  9.  Metabolic bone disease - Ca controlled. Continue binders, VDRA. 10.  Nutrition - Albumin 3.6. Renal/Carb mod diet. Nepro, renal vits.    Martia Dalby H. Owens Shark, NP-C 09/03/2018, 10:30 AM  [employer] [phone]

## 2018-09-03 NOTE — Progress Notes (Deleted)
Pt refused CHG and VS, stating he is cold and he will do it later. Will let day shift know.

## 2018-09-03 NOTE — H&P (Signed)
History and Physical    Kelly Huff JOA:[id number] DOB: [birth year] DOA: 09/02/2018  PCP: Rubie Maid, MD  Patient coming from: Home.  Chief Complaint: Altered mental status.  History was provided by patient's husband with home ER physician discussed.  I was unable to reach patient's husband.  HPI: Kelly Huff is a 33 y.o. female with history of diabetes mellitus type 1 on insulin pump, hypertension, ESRD on hemodialysis on Tuesday Thursdays and Saturday last hemodialysis was on Sep 01, 2018 with history of atypical hemolytic uremic syndrome was brought in the ER after patient was found to be increasingly confused since 3 PM on Sep 02, 2018 yesterday.  Patient was found to be complaining of needing help.  And had multiple episodes of vomiting.  Also had complained of headache.  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  No signs of any drug overdose.  ED Course: In the ER patient blood pressures found to be markedly elevated with systolic more than 601.  CT head was unremarkable.  Since patient was recently admitted for enteritis and has been throwing up multiple times in the ER CT abdomen and pelvis was done which was unremarkable.  COVID-19 test was negative.  Patient's temperature was 99 F.  Labs show WBC count of 10.2 hemoglobin of 12.4 platelets 317 sodium 131 glucose 382 bicarb 20 anion gap 15 alcohol and salicylate levels were negative.  Acetaminophen level was negative.  Chest x-ray nothing acute.  Patient was given labetalol and IV hydralazine for uncontrolled hypertension.  Was also given 1 dose of Ativan and 2 mg IV Haldol for agitation.  At the time of my exam patient is calm and lethargic and responding to her name.  Review of Systems: As per HPI, rest all negative.   Past Medical History:  Diagnosis Date   Chronic kidney disease    Diabetes mellitus without complication ([place])    History of  pre-eclampsia 2016   mild   Hypertension     Past Surgical History:  Procedure Laterality Date   CESAREAN SECTION     DILATION AND CURETTAGE OF UTERUS     x 4     reports that she has never smoked. She has never used smokeless tobacco. She reports that she does not drink alcohol or use drugs.  Allergies  Allergen Reactions   Morphine     Other reaction(s): Hallucination   Sulfamethoxazole-Trimethoprim Hives   Trulicity [Dulaglutide]     Family History  Problem Relation Age of Onset   Hypertension Mother    Cancer Mother        Uterine vs cervical (pt unsure),    Schizophrenia Brother    Breast cancer Neg Hx     Prior to Admission medications   Medication Sig Start Date End Date Taking? Authorizing Provider  amoxicillin-clavulanate (AUGMENTIN) 500-125 MG tablet Take 1 tablet (500 mg total) by mouth daily. 07/29/18   Geradine Girt, DO  calcium acetate (PHOSLO) 667 MG capsule Take 667 mg by mouth 3 (three) times daily.  11/26/17 11/26/18  [provider]  clobetasol ointment (TEMOVATE) 0.93 % Apply 1 application topically 2 (two) times daily as needed for itching. 03/07/18   [provider]  Insulin Human (INSULIN PUMP) SOLN Inject into the skin continuous. Novolog    [provider]  metoprolol succinate (TOPROL-XL) 25 MG 24 hr tablet Take 25 mg by mouth daily. 02/14/18   [provider]  NIFEdipine (PROCARDIA-XL/NIFEDICAL-XL) 30 MG 24 hr tablet Take 2 tablets (60 mg total) by mouth 2 (two) times daily. 07/29/18   Geradine Girt, DO  ondansetron (ZOFRAN ODT) 4 MG disintegrating tablet Take 1 tablet (4 mg total) by mouth every 8 (eight) hours as needed for nausea or vomiting. 07/29/18   Geradine Girt, DO  oxyCODONE (OXY IR/ROXICODONE) 5 MG immediate release tablet Take 1 tablet (5 mg total) by mouth every 4 (four) hours as needed for moderate pain or breakthrough pain. 07/29/18   Geradine Girt, DO  paricalcitol (ZEMPLAR) 1 MCG capsule  Take 1 mcg by mouth daily.    [provider]    Physical Exam: Vitals:   09/03/18 0230 09/03/18 0341 09/03/18 0344 09/03/18 0346  BP: (!) 196/105 (!) 187/98  (!) 182/95  Pulse: 95   (!) 105  Resp: 15   (!) 23  Temp:   99.1 F (37.3 C)   TempSrc:   Oral   SpO2: 100%   98%      Constitutional: Moderately built and nourished. Vitals:   09/03/18 0230 09/03/18 0341 09/03/18 0344 09/03/18 0346  BP: (!) 196/105 (!) 187/98  (!) 182/95  Pulse: 95   (!) 105  Resp: 15   (!) 23  Temp:   99.1 F (37.3 C)   TempSrc:   Oral   SpO2: 100%   98%   Eyes: Anicteric no pallor. ENMT: No discharge from the ears eyes nose or mouth. Neck: No mass felt.  No neck rigidity. Respiratory: No rhonchi or crepitations. Cardiovascular: S1-S2 heard. Abdomen: Soft nontender bowel sounds present. Musculoskeletal: No edema.  No joint effusion. Skin: No rash. Neurologic: Patient at the time of my exam is received Ativan and Haldol is slightly sedated and only responds to her name.  Pupils are equal and reacting to light. Psychiatric: Sedated.   Labs on Admission: I have personally reviewed following labs and imaging studies  CBC: Recent Labs  Lab 09/02/18 1909 09/02/18 1919  WBC 10.2  --   NEUTROABS 8.6*  --   HGB 12.4 12.2  HCT 36.7 36.0  MCV 78.4*  --   PLT 317  --    Basic Metabolic Panel: Recent Labs  Lab 09/02/18 1909 09/02/18 1919  NA 131* 132*  K 3.5 3.6  CL 96*  --   CO2 20*  --   GLUCOSE 382*  --   BUN 28*  --   CREATININE 8.18*  --   CALCIUM 9.6  --    GFR: CrCl cannot be calculated (Unknown ideal weight.). Liver Function Tests: Recent Labs  Lab 09/02/18 1909  AST 19  ALT 14  ALKPHOS 127*  BILITOT 0.5  PROT 7.4  ALBUMIN 3.6   No results for input(s): LIPASE, AMYLASE in the last 168 hours. Recent Labs  Lab 09/03/18 0223  AMMONIA 26   Coagulation Profile: No results for input(s): INR, PROTIME in the last 168 hours. Cardiac Enzymes: No results for  input(s): CKTOTAL, CKMB, CKMBINDEX, TROPONINI in the last 168 hours. BNP (last 3 results) No results for input(s): PROBNP in the last 8760 hours. HbA1C: No results for input(s): HGBA1C in the last 72 hours. CBG: Recent Labs  Lab 09/02/18 1856 09/02/18 1930 09/03/18 0119  GLUCAP 359* 337* 357*   Lipid Profile: No results for input(s): CHOL, HDL, LDLCALC, TRIG, CHOLHDL, LDLDIRECT in the last 72 hours. Thyroid Function Tests: No results for input(s): TSH, T4TOTAL, FREET4, T3FREE, THYROIDAB in the last 72 hours. Anemia  Panel: No results for input(s): VITAMINB12, FOLATE, FERRITIN, TIBC, IRON, RETICCTPCT in the last 72 hours. Urine analysis:    Component Value Date/Time   COLORURINE YELLOW 07/28/2018 0609   APPEARANCEUR CLOUDY (A) 07/28/2018 0609   APPEARANCEUR Clear 06/25/2017 1042   LABSPEC 1.024 07/28/2018 0609   PHURINE 5.0 07/28/2018 0609   GLUCOSEU 150 (A) 07/28/2018 0609   HGBUR SMALL (A) 07/28/2018 0609   BILIRUBINUR NEGATIVE 07/28/2018 0609   BILIRUBINUR neg 08/05/2017 1514   BILIRUBINUR Negative 06/25/2017 1042   KETONESUR NEGATIVE 07/28/2018 0609   PROTEINUR 100 (A) 07/28/2018 0609   UROBILINOGEN 0.2 08/05/2017 1514   NITRITE NEGATIVE 07/28/2018 0609   LEUKOCYTESUR NEGATIVE 07/28/2018 0609   Sepsis Labs: @LABRCNTIP (procalcitonin:4,lacticidven:4) ) Recent Results (from the past 240 hour(s))  SARS Coronavirus 2 [name] order, Performed in [place] hospital lab)     Status: None   Collection Time: 09/03/18 12:44 AM  Result Value Ref Range Status   SARS Coronavirus 2 NEGATIVE NEGATIVE Final    Comment: (NOTE) If result is NEGATIVE SARS-CoV-2 target nucleic acids are NOT DETECTED. The SARS-CoV-2 RNA is generally detectable in upper and lower  respiratory specimens during the acute phase of infection. The lowest  concentration of SARS-CoV-2 viral copies this assay can detect is 250  copies / mL. A negative result does not preclude SARS-CoV-2 infection  and  should not be used as the sole basis for treatment or other  patient management decisions.  A negative result may occur with  improper specimen collection / handling, submission of specimen other  than nasopharyngeal swab, presence of viral mutation(s) within the  areas targeted by this assay, and inadequate number of viral copies  (<250 copies / mL). A negative result must be combined with clinical  observations, patient history, and epidemiological information. If result is POSITIVE SARS-CoV-2 target nucleic acids are DETECTED. The SARS-CoV-2 RNA is generally detectable in upper and lower  respiratory specimens dur ing the acute phase of infection.  Positive  results are indicative of active infection with SARS-CoV-2.  Clinical  correlation with patient history and other diagnostic information is  necessary to determine patient infection status.  Positive results do  not rule out bacterial infection or co-infection with other viruses. If result is PRESUMPTIVE POSTIVE SARS-CoV-2 nucleic acids MAY BE PRESENT.   A presumptive positive result was obtained on the submitted specimen  and confirmed on repeat testing.  While 2019 novel coronavirus  (SARS-CoV-2) nucleic acids may be present in the submitted sample  additional confirmatory testing may be necessary for epidemiological  and / or clinical management purposes  to differentiate between  SARS-CoV-2 and other Sarbecovirus currently known to infect humans.  If clinically indicated additional testing with an alternate test  methodology [phone]) is advised. The SARS-CoV-2 RNA is generally  detectable in upper and lower respiratory sp ecimens during the acute  phase of infection. The expected result is Negative. Fact Sheet for Patients:  StrictlyIdeas.no Fact Sheet for Healthcare Providers: BankingDealers.co.za This test is not yet approved or cleared by the [place] FDA and has been  authorized for detection and/or diagnosis of SARS-CoV-2 by FDA under an Emergency Use Authorization (EUA).  This EUA will remain in effect (meaning this test can be used) for the duration of the COVID-19 declaration under Section 564(b)(1) of the Act, 21 U.S.C. section 360bbb-3(b)(1), unless the authorization is terminated or revoked sooner. Performed at [facility], [place] [address], [place], [place] [address]  Radiological Exams on Admission: Ct Head Wo Contrast  Result Date: 09/02/2018 CLINICAL DATA:  Altered mental status EXAM: CT HEAD WITHOUT CONTRAST TECHNIQUE: Contiguous axial images were obtained from the base of the skull through the vertex without intravenous contrast. COMPARISON:  None. FINDINGS: Brain: No evidence of acute infarction, hemorrhage, hydrocephalus, extra-axial collection or mass lesion/mass effect. Vascular: No hyperdense vessel or unexpected calcification. Skull: Normal. Negative for fracture or focal lesion. Sinuses/Orbits: No acute finding. Other: None. IMPRESSION: No acute intracranial abnormality noted. Electronically Signed   By: Inez Catalina M.D.   On: 09/02/2018 20:04   Ct Abdomen Pelvis W Contrast  Result Date: 09/02/2018 CLINICAL DATA:  33 year old female with abdominal pain, nausea vomiting. EXAM: CT ABDOMEN AND PELVIS WITH CONTRAST TECHNIQUE: Multidetector CT imaging of the abdomen and pelvis was performed using the standard protocol following bolus administration of intravenous contrast. CONTRAST:  172mL OMNIPAQUE IOHEXOL 300 MG/ML  SOLN COMPARISON:  CT of the abdomen pelvis dated 07/27/2018 FINDINGS: Lower chest: There is mild cardiomegaly. The tip of central line noted in the right atrium, likely related to a dialysis catheter. The visualized lung bases are clear. No intra-abdominal free air or free fluid. Hepatobiliary: No focal liver abnormality is seen. No gallstones, gallbladder wall thickening, or biliary dilatation. Pancreas: Unremarkable. No  pancreatic ductal dilatation or surrounding inflammatory changes. Spleen: Normal in size without focal abnormality. Adrenals/Urinary Tract: The adrenal glands are unremarkable. There is no hydronephrosis on either side. Subcentimeter right renal hypodense focus is too small to characterize. The visualized ureters and urinary bladder appear unremarkable. Stomach/Bowel: There is no bowel obstruction or active inflammation. The appendix contains appendicoliths otherwise unremarkable. Vascular/Lymphatic: No significant vascular findings are present. No enlarged abdominal or pelvic lymph nodes. Reproductive: Uterus and bilateral adnexa are unremarkable. Other: None Musculoskeletal: No acute osseous pathology. Coarsened and thickened appearance of the left pelvic bone similar to prior study. IMPRESSION: No acute intra-abdominal or pelvic pathology. Interval resolution of the previously seen bowel inflammation. Electronically Signed   By: Anner Crete M.D.   On: 09/02/2018 21:24   Dg Chest Port 1 View  Result Date: 09/02/2018 CLINICAL DATA:  Altered mental status EXAM: PORTABLE CHEST 1 VIEW COMPARISON:  None. FINDINGS: Cardiac shadow is at the upper limits of normal in size. Dialysis catheter is noted in satisfactory position. The lungs are clear. No bony abnormality is noted. IMPRESSION: No active disease. Electronically Signed   By: Inez Catalina M.D.   On: 09/02/2018 19:28    EKG: Independently reviewed.  Sinus tachycardia with baseline wander.  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 Acute encephalopathy -cause not clear.  Hypertensive encephalopathy could be 1 of the differentials.  MRI brain is pending.  If in the next few hours patient's mental status does not improve we will keep  patient on empiric antibiotics and order lumbar puncture.  Patient is already had blood cultures drawn.  Repeat labs.  Closely follow metabolic panel for any development of DKA.  Psychiatric causes could also be in the differential. 2. Hypertensive urgency for now kept patient on PRN IV hydralazine.  If blood pressure does not improve may need continuous infusion of antihypertensives. 3. Nausea vomiting could be from uncontrolled blood pressure.  CT abdomen and pelvis was unremarkable. 4. Diabetes mellitus type 1  with hyperglycemia -holding of insulin pump since patient is encephalopathic I have ordered Lantus 15 units with CBG checks every 4 hours.  If metabolic panel shows worsening of anion gap then may need IV insulin infusion. 5. History of atypical hemolytic uremic syndrome.  Patient receives eculizumab infusion treatments with Dr. Rogue Bussing as outpatient. 6. ESRD on hemodialysis on Tuesday Thursdays and Saturday.  Consult nephrology for dialysis.   DVT prophylaxis: SCDs in anticipation of procedure. Code Status: Full code. Family Communication: Unable to reach patient husband. Disposition Plan: Home. Consults called: None. Admission status: Observation.   Rise Patience MD [facility] Pager [phone].  If 7PM-7AM, please contact night-coverage [email or web address] Password [email or web address]  09/03/2018, 3:56 AM

## 2018-09-03 NOTE — ED Notes (Signed)
Attempted report to 5W. 

## 2018-09-03 NOTE — Progress Notes (Addendum)
1200: Care of patient taken over by this nurse, report recieved. Patient not in room, in dialysis.  1640: Patient back from Dialysis.   1645: Unable to complete assessment. Patient not answering questions. Sleeping, refuses to awake. VSS.  1900: Patient awake, stating "I just want to go home."   1910: Patient up walking in room when this nurse went to call mother for her. Mother transferred to room. Spoke with early, unable to wake patient at that time.

## 2018-09-04 ENCOUNTER — Observation Stay (HOSPITAL_COMMUNITY): Payer: Medicare Other

## 2018-09-04 ENCOUNTER — Other Ambulatory Visit: Payer: Self-pay

## 2018-09-04 DIAGNOSIS — N186 End stage renal disease: Secondary | ICD-10-CM | POA: Diagnosis not present

## 2018-09-04 DIAGNOSIS — I160 Hypertensive urgency: Secondary | ICD-10-CM | POA: Diagnosis not present

## 2018-09-04 DIAGNOSIS — G9340 Encephalopathy, unspecified: Secondary | ICD-10-CM | POA: Diagnosis not present

## 2018-09-04 DIAGNOSIS — D593 Hemolytic-uremic syndrome: Secondary | ICD-10-CM

## 2018-09-04 DIAGNOSIS — F23 Brief psychotic disorder: Secondary | ICD-10-CM | POA: Diagnosis not present

## 2018-09-04 LAB — BASIC METABOLIC PANEL
Anion gap: 13 (ref 5–15)
BUN: 18 mg/dL (ref 6–20)
CO2: 27 mmol/L (ref 22–32)
Calcium: 9.1 mg/dL (ref 8.9–10.3)
Chloride: 96 mmol/L — ABNORMAL LOW (ref 98–111)
Creatinine, Ser: 6.56 mg/dL — ABNORMAL HIGH (ref 0.44–1.00)
GFR calc Af Amer: 9 mL/min — ABNORMAL LOW (ref >60)
GFR calc non Af Amer: 8 mL/min — ABNORMAL LOW (ref >60)
Glucose, Bld: 215 mg/dL — ABNORMAL HIGH (ref 70–99)
Potassium: 4.3 mmol/L (ref 3.5–5.1)
Sodium: 136 mmol/L (ref 135–145)

## 2018-09-04 LAB — CSF CELL COUNT WITH DIFFERENTIAL
RBC Count, CSF: 1 /mm3 — ABNORMAL HIGH
Tube #: 3
WBC, CSF: 1 /mm3 (ref 0–5)

## 2018-09-04 LAB — PROTEIN, CSF: Total  Protein, CSF: 22 mg/dL (ref 15–45)

## 2018-09-04 LAB — GLUCOSE, CSF: Glucose, CSF: 126 mg/dL — ABNORMAL HIGH (ref 40–70)

## 2018-09-04 LAB — CBC
HCT: 33.7 % — ABNORMAL LOW (ref 36.0–46.0)
Hemoglobin: 10.8 g/dL — ABNORMAL LOW (ref 12.0–15.0)
MCH: 26.2 pg (ref 26.0–34.0)
MCHC: 32 g/dL (ref 30.0–36.0)
MCV: 81.6 fL (ref 80.0–100.0)
Platelets: 312 10*3/uL (ref 150–400)
RBC: 4.13 MIL/uL (ref 3.87–5.11)
RDW: 15.9 % — ABNORMAL HIGH (ref 11.5–15.5)
WBC: 9 10*3/uL (ref 4.0–10.5)
nRBC: 0 % (ref 0.0–0.2)

## 2018-09-04 LAB — GLUCOSE, CAPILLARY
Glucose-Capillary: 255 mg/dL — ABNORMAL HIGH (ref 70–99)
Glucose-Capillary: 88 mg/dL (ref 70–99)

## 2018-09-04 LAB — C3 COMPLEMENT: C3 Complement: 116 mg/dL (ref 82–167)

## 2018-09-04 LAB — C4 COMPLEMENT: Complement C4, Body Fluid: 42 mg/dL (ref 14–44)

## 2018-09-04 MED ORDER — CLONIDINE HCL 0.1 MG PO TABS: 0.1000 mg | ORAL_TABLET | Freq: Two times a day (BID) | ORAL | 11 refills | Status: DC | PRN

## 2018-09-04 MED ORDER — LIDOCAINE HCL (PF) 1 % IJ SOLN
INTRAMUSCULAR | Status: AC
  Filled 2018-09-04: qty 5

## 2018-09-04 MED ORDER — LORAZEPAM 2 MG/ML IJ SOLN: 1.0000 mg | Freq: Once | INTRAMUSCULAR | Status: DC | PRN

## 2018-09-04 NOTE — Progress Notes (Signed)
Assessed LP site to lower back. No signs of complications observed and pt denies any pain. Remains on back per instructions.

## 2018-09-04 NOTE — Progress Notes (Signed)
Pt assisted to vehicle; in wheelchair, by this Probation officer.

## 2018-09-04 NOTE — Progress Notes (Signed)
Pt given oral and written discharge instructions. Educated on care post lumbar puncture. Peripheral IV to LAC removed and pt tolerated well. No complications noted. Assisted to dress and belongings packed. Awaiting family for pick up to discharge home.

## 2018-09-04 NOTE — Progress Notes (Signed)
Pt declined ativan ordered prior to MRI. States she is not claustrophobic, has tolerated an MRI; without meds, well in the past, and does not want to take ativan at this time. Did not administer per pt request.

## 2018-09-04 NOTE — Progress Notes (Signed)
[facility] Progress Note    Assessment/ Plan:   33 y.o. female on hemodialysis T,Th,S at [facility]. 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 Started HD 05/2018 at [facility] where she was formerly followed prior to starting HD. Here with hypertensive urgency and AMS neg head CT tx empiric Abx.  Dialysis Orders: [facility] T,Th,S 3.5 hr 180NRe 400/800 79 kg 2.0K/ 2.5 Ca UFP 4 -Heparin 5000 units IV initial bolus, Heparin 2000 units IV mid run -Aranesp 100 mcg IV weekly (last dose 09/01/18 Last HGB 11.6 09/01/2018) -Hectorol 3 mcg IV TIW (last PTH 256 08/18/18 Last phos 6.3 Ca 8.8 09/01/2018)   Assessment/Plan: 1.  AMS-Unknown etiology. Differential includes hypertensive encephalopathy, infection, psychiatric issues. Per primary.Improved today and she was alert, appropriate and pleasant. Forgot to replace pod for her insulin pump. 2. Leukocytosis/low grade temp-COVID 19 negative. BC drawn/pending. Started on Vanc, rocephin, acyclovir per primary.  3. Hypertensive Urgency-BP looks better at present-155/79. Metoprolol succ 50mg  daily and nifedipine 60 mg PO BID on OP med list. Will attempt to lower volume in HD, hopefully lower BP. Not taking oral meds at present.  4.  DKA-Co2 19 BS 357-425. No insulin/DKA protocol yet. Hold IVF in DKA protocol. Per primary 5.  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 Rogue Bussing. Considering switching to Ultimoris infusions.   6.  ESRD - T,Th,S  -> she will be transitioning to PD, PD catheter to be placed by Dr. Raul Del in the very near future. - Received HD Saturday on schedule for 3.5hr treatment time  w/ 1.5L net UF. - Next HD Tues.   7.  Hypertension/volume  - Please see # 2. Does not appear overtly volume overloaded by exam or chest Xray.  8.  Anemia  - Managed by nephrology. HGB 12.6, Recent ESA dose. Follow HGB.  9.  Metabolic bone disease - Ca controlled. Continue  binders (phoslo 1 TIDM), VDRA. 10.  Nutrition - Albumin 3.6. Renal/Carb mod diet. Nepro, renal vits.   Subjective:   Feels well, denies f/c/n/v/cough Elsie Saas and H/A.  States that she forgot to replace pod for her insulin pump.   Objective:   BP (!) 151/93 (BP Location: Right Arm) Comment: notify the nurse  Pulse 99   Temp 98.7 F (37.1 C) (Oral)   Resp 16   Wt 77.6 kg   SpO2 100%   BMI 26.79 kg/m   Intake/Output Summary (Last 24 hours) at 09/04/2018 7622 Last data filed at 09/03/2018 2249 Gross per 24 hour  Intake 282.21 ml  Output 2000 ml  Net -1717.79 ml   Weight change: -0.8 kg  Physical Exam: GEN: NAD, A&Ox3, NCAT HEENT: No conjunctival pallor, EOMI NECK: Supple, no thyromegaly LUNGS: CTA B/L no rales, rhonchi or wheezing CV: RRR, No M/R/G ABD: SNDNT +BS  EXT: No lower extremity edema ACCESS: RIJ TC    Imaging: Ct Head Wo Contrast  Result Date: 09/02/2018 CLINICAL DATA:  Altered mental status EXAM: CT HEAD WITHOUT CONTRAST TECHNIQUE: Contiguous axial images were obtained from the base of the skull through the vertex without intravenous contrast. COMPARISON:  None. FINDINGS: Brain: No evidence of acute infarction, hemorrhage, hydrocephalus, extra-axial collection or mass lesion/mass effect. Vascular: No hyperdense vessel or unexpected calcification. Skull: Normal. Negative for fracture or focal lesion. Sinuses/Orbits: No acute finding. Other: None. IMPRESSION: No acute intracranial abnormality noted. Electronically Signed   By: Inez Catalina M.D.   On: 09/02/2018 20:04   Ct Abdomen Pelvis W  Contrast  Result Date: 09/02/2018 CLINICAL DATA:  33 year old female with abdominal pain, nausea vomiting. EXAM: CT ABDOMEN AND PELVIS WITH CONTRAST TECHNIQUE: Multidetector CT imaging of the abdomen and pelvis was performed using the standard protocol following bolus administration of intravenous contrast. CONTRAST:  139mL OMNIPAQUE IOHEXOL 300 MG/ML  SOLN COMPARISON:  CT of the  abdomen pelvis dated 07/27/2018 FINDINGS: Lower chest: There is mild cardiomegaly. The tip of central line noted in the right atrium, likely related to a dialysis catheter. The visualized lung bases are clear. No intra-abdominal free air or free fluid. Hepatobiliary: No focal liver abnormality is seen. No gallstones, gallbladder wall thickening, or biliary dilatation. Pancreas: Unremarkable. No pancreatic ductal dilatation or surrounding inflammatory changes. Spleen: Normal in size without focal abnormality. Adrenals/Urinary Tract: The adrenal glands are unremarkable. There is no hydronephrosis on either side. Subcentimeter right renal hypodense focus is too small to characterize. The visualized ureters and urinary bladder appear unremarkable. Stomach/Bowel: There is no bowel obstruction or active inflammation. The appendix contains appendicoliths otherwise unremarkable. Vascular/Lymphatic: No significant vascular findings are present. No enlarged abdominal or pelvic lymph nodes. Reproductive: Uterus and bilateral adnexa are unremarkable. Other: None Musculoskeletal: No acute osseous pathology. Coarsened and thickened appearance of the left pelvic bone similar to prior study. IMPRESSION: No acute intra-abdominal or pelvic pathology. Interval resolution of the previously seen bowel inflammation. Electronically Signed   By: Anner Crete M.D.   On: 09/02/2018 21:24   Dg Chest Port 1 View  Result Date: 09/02/2018 CLINICAL DATA:  Altered mental status EXAM: PORTABLE CHEST 1 VIEW COMPARISON:  None. FINDINGS: Cardiac shadow is at the upper limits of normal in size. Dialysis catheter is noted in satisfactory position. The lungs are clear. No bony abnormality is noted. IMPRESSION: No active disease. Electronically Signed   By: Inez Catalina M.D.   On: 09/02/2018 19:28    Labs: BMET Recent Labs  Lab 09/02/18 1909 09/02/18 1919 09/03/18 0447 09/03/18 1042 09/04/18 0426  NA 131* 132* 130* 133* 136  K 3.5 3.6  4.7 4.4 4.3  CL 96*  --  90* 91* 96*  CO2 20*  --  19* 21* 27  GLUCOSE 382*  --  381* 359* 215*  BUN 28*  --  33* 37* 18  CREATININE 8.18*  --  8.87* 9.27* 6.56*  CALCIUM 9.6  --  9.8 10.0 9.1  PHOS  --   --   --  3.1  --    CBC Recent Labs  Lab 09/02/18 1909 09/02/18 1919 09/03/18 0447 09/04/18 0426  WBC 10.2  --  14.4* 9.0  NEUTROABS 8.6*  --  13.5*  --   HGB 12.4 12.2 12.6 10.8*  HCT 36.7 36.0 37.5 33.7*  MCV 78.4*  --  79.1* 81.6  PLT 317  --  340 312    Medications:    . calcium acetate  667 mg Oral TID WC  . Chlorhexidine Gluconate Cloth  6 each Topical Q0600  . insulin aspart  0-15 Units Subcutaneous TID WC  . insulin aspart  0-5 Units Subcutaneous QHS  . insulin glargine  15 Units Subcutaneous QHS  . LORazepam  1 mg Intravenous Once  . metoprolol succinate  25 mg Oral Daily  . NIFEdipine  60 mg Oral BID  . paricalcitol  1 mcg Oral Daily      Otelia Santee, MD 09/04/2018, 7:22 AM

## 2018-09-04 NOTE — Procedures (Signed)
ELECTROENCEPHALOGRAM REPORT   Patient: Kelly Huff       Room #: 3G18E EEG No. ID: [id number] Age: 33 y.o.        Sex: female Referring Physician: Danford Report Date:  09/04/2018        Interpreting Physician: Alexis Goodell  History: [name] is an 33 y.o. female with altered mental status  Medications:  Zovirax, Phoslo, Insulin, Toprol, Procardia, Zemplar, Vancomycin, Rocephin  Conditions of Recording:  This is a 21 channel routine scalp EEG performed with bipolar and monopolar montages arranged in accordance to the international 10/20 system of electrode placement. One channel was dedicated to EKG recording.  The patient is in the awake state.  Description:  The waking background activity consists of a low voltage, symmetrical, fairly well organized, 9 Hz alpha activity, seen from the parieto-occipital and posterior temporal regions.  Low voltage fast activity, poorly organized, is seen anteriorly and is at times superimposed on more posterior regions.  A mixture of theta and alpha rhythms are seen from the central and temporal regions.  There are frequent, intermittent periods of generalized slowing.  These periods consist of an underlying polymorphic delta rhythm that slows the background rhythm bilaterally lasting up to 4 seconds per episode.  There is no clinical change in the patient with these episodes.   No epileptiform activity is noted.   Hyperventilation was not performed.  Intermittent photic stimulation was performed but failed to illicit any change in the tracing.    IMPRESSION: This electroencephalogram shows evidence of normal wakefulness with intermittent periods of slowing of unclear significance.  These may represent intermittent drowse but can not rule out of an encephalopathy in the differential as well.  Clinical correlation recommended.  No epileptiform activity is noted.     Alexis Goodell, MD Neurology [phone] 09/04/2018, 1:42 PM

## 2018-09-04 NOTE — Discharge Summary (Signed)
Physician Discharge Summary  Kelly Huff YDX:[id number] DOB: June 15, 1985 DOA: 09/02/2018  PCP: Rubie Maid, MD  Admit date: 09/02/2018 Discharge date: 09/04/2018  Admitted From: Home  Disposition:  Home   Recommendations for Outpatient Follow-up:  1. Follow up with Nephrology for BP check in 1 week 2. Please obtain BMP/CBC in one week 3. Dr. Arty Baumgartner: Please follow up on the following pending results: ANA, anti-dsDNA, complements  Home Health: None  Equipment/Devices: None  Discharge Condition: Good  CODE STATUS: FULL Diet recommendation: Diabetic renal  Brief/Interim Summary: [name] is a 33 y.o. 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  Had been normal until day of admission, when sugars were high, BP high.  That afternoon, developed headache, then vomited.  At that point, it became clear that she was confused and disoriented, so her husband brought her to the ER.  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  CT head and abdomen negative in ER.  Required Haldol and lorazepam for sedation.     PRINCIPAL HOSPITAL DIAGNOSIS: Acute psychosis    Discharge Diagnoses:   Acute psychosis, unclear cause Headache CT head on admission was normal.  Initial differential included PRES in setting of severe range pressure, acute schizophrenic break, less likely SLE-related psychosis, meningitis (increased risk with Eculizumab), paraneoplastic syndrome, seizure.  Sister with schizophrenia.  Grandmother with SLE   She was admitted, started on empiric antibiotics for meningitis and antihypertensives.  Patient underwent EEG that was normal.  MRI brain without  contrast was normal.   LP showed no cells, normal protein, gram stain negative.    Overnight, after sleeping, the patient was completely normal.  She had no neurological deficits.  Antibiotics were stopped and she was discharged.  She denied depressed mood, feelings of self-harm, psychotic features.     Hypertensive urgency  BP >200/110 mmHg at admission.  Given IV hydralazine and labetalol and restarted home regimen of nifedipine long-acting and metoprolol and BP improved.  After HD, BP improved.    Her BP reportedly is worst on dialysis days. Started on PRN clonidine.  To be titrated by primary Nephrologist.     Diabetes with hyperglycemia Patient presented with glucose >400 mg/dL.  She later reported her insulin pump frequently runs out at night (rather than putting on a new cartridge before bed and wasting a little insulin, she will let it run out overnight, but while sleeping won't wake up to put a new cartridge on until morning).  Emphasized strict discipline with pump and no periods without insulin.   ESRD Underwent routine HD on Saturday.  Hyponatremia Mild, asymptomatic           Discharge Instructions  Discharge Instructions    Discharge instructions   Complete by:  As directed    From Dr. Loleta Books: You were admitted for sudden confusion, with headache, high blood pressure, and high blood sugar.  We reduced your blood pressure, controlled your sugar, did dialysis, and your symptoms resolved. We were concerned about bleeding in the brain, cancer in the brain, and meningitis, but we ruled out these things with imaging of your head (the CT scan at first, then the MRI), and then the LP/spinal tap.  Thankfully,  all your testing was normal.  For your blood pressure: Continue your metoprolol at 50 mg daily Continue your nifedipine at your previous dose Check your blood pressure on dialysis days, both before and after your session and occasionally on  non-dialysis days  Add the blood pressure medicine clonidine on dialysis days Start with clonidine 0.1 mg before dialysis If your pressure is still high, increase to clonidine 0.1 mg twice daily You can increase the medicine gradually to 0.2 mg (two tabs) as often as three times per day  If you are using this consistently, though, just call Dr. Arty Baumgartner to add a daily medicine.  When you check your blood pressure It should always be 120s or 130s.  If it is higher that 140 consistently, call Dr. Arty Baumgartner If it is higher than 160 ever, take your extra medicine, clonidine   Lastly, do not let your insulin pump run out.  This requires a lot of discipline, but plan ahead, so that it doesn't run out overnight, and if it does run out overnight, wake up and replace it.   DO NOT GO WITHOUT INSULIN   Increase activity slowly   Complete by:  As directed      Allergies as of 09/04/2018      Reactions   Morphine    Other reaction(s): Hallucination   Sulfamethoxazole-trimethoprim Hives   Trulicity [dulaglutide] Other (See Comments)   She is DM Type 1. Was prescribed but messed up her kidneys      Medication List    STOP taking these medications   oxyCODONE 5 MG immediate release tablet Commonly known as:  Oxy IR/ROXICODONE     TAKE these medications   calcium acetate 667 MG capsule Commonly known as:  PHOSLO Take 667 mg by mouth 3 (three) times daily.   clobetasol ointment 0.05 % Commonly known as:  TEMOVATE Apply 1 application topically 2 (two) times daily as needed for itching.   cloNIDine 0.1 MG tablet Commonly known as:  Catapres Take 1 tablet (0.1 mg total) by mouth 2 (two) times daily as needed.   insulin pump Soln Inject into the skin continuous. Novolog   metoprolol succinate 50 MG 24 hr tablet Commonly known as:  TOPROL-XL Take 50 mg by mouth daily.   NIFEdipine 60 MG 24 hr tablet Commonly known as:  PROCARDIA XL/NIFEDICAL XL Take 60 mg by mouth 2 (two) times a  day. What changed:  Another medication with the same name was removed. Continue taking this medication, and follow the directions you see here.   NOVOLOG [place] Inject 0-20 Units into the skin See admin instructions. Uses with Insulin Pump   ondansetron 4 MG disintegrating tablet Commonly known as:  Zofran ODT Take 1 tablet (4 mg total) by mouth every 8 (eight) hours as needed for nausea or vomiting.   paricalcitol 1 MCG capsule Commonly known as:  ZEMPLAR Take 1 mcg by mouth daily.   [place] Caps 1 MG Caps Take 1 mg by mouth daily.      Follow-up Information    Donato Heinz, MD Follow up.   Specialty:  Nephrology Why:  Call if blood pressure is consistently greater than 140 Contact information: [address] [place] [address] [phone]          Allergies  Allergen Reactions  . Morphine     Other reaction(s): Hallucination  . Sulfamethoxazole-Trimethoprim Hives  . Trulicity [Dulaglutide] Other (See Comments)    She is DM Type 1. Was prescribed but messed  up her kidneys    Consultations:  Neurology   Procedures/Studies: Ct Head Wo Contrast  Result Date: 09/02/2018 CLINICAL DATA:  Altered mental status EXAM: CT HEAD WITHOUT CONTRAST TECHNIQUE: Contiguous axial images were obtained from the base of the skull through the vertex without intravenous contrast. COMPARISON:  None. FINDINGS: Brain: No evidence of acute infarction, hemorrhage, hydrocephalus, extra-axial collection or mass lesion/mass effect. Vascular: No hyperdense vessel or unexpected calcification. Skull: Normal. Negative for fracture or focal lesion. Sinuses/Orbits: No acute finding. Other: None. IMPRESSION: No acute intracranial abnormality noted. Electronically Signed   By: Inez Catalina M.D.   On: 09/02/2018 20:04   Mr Brain Wo Contrast  Result Date: 09/04/2018 CLINICAL DATA:  Worsening confusion. History of diabetes and end-stage renal disease. EXAM: MRI HEAD WITHOUT CONTRAST TECHNIQUE:  Multiplanar, multiecho pulse sequences of the brain and surrounding structures were obtained without intravenous contrast. COMPARISON:  Head CT 09/02/2018 and MRI 07/03/2017 FINDINGS: Brain: There is no evidence of acute infarct, intracranial hemorrhage, mass, midline shift, or extra-axial fluid collection. The ventricles and sulci are normal. The brain is normal in signal. Vascular: Major intracranial vascular flow voids are preserved. Skull and upper cervical spine: Unremarkable bone marrow signal. Sinuses/Orbits: Unremarkable orbits. Paranasal sinuses and mastoid air cells are clear. Other: Small lymph nodes in the upper neck bilaterally, decreased in size from the prior MRI. IMPRESSION: Negative brain MRI. Electronically Signed   By: Logan Bores M.D.   On: 09/04/2018 09:22   Ct Abdomen Pelvis W Contrast  Result Date: 09/02/2018 CLINICAL DATA:  33 year old female with abdominal pain, nausea vomiting. EXAM: CT ABDOMEN AND PELVIS WITH CONTRAST TECHNIQUE: Multidetector CT imaging of the abdomen and pelvis was performed using the standard protocol following bolus administration of intravenous contrast. CONTRAST:  133mL OMNIPAQUE IOHEXOL 300 MG/ML  SOLN COMPARISON:  CT of the abdomen pelvis dated 07/27/2018 FINDINGS: Lower chest: There is mild cardiomegaly. The tip of central line noted in the right atrium, likely related to a dialysis catheter. The visualized lung bases are clear. No intra-abdominal free air or free fluid. Hepatobiliary: No focal liver abnormality is seen. No gallstones, gallbladder wall thickening, or biliary dilatation. Pancreas: Unremarkable. No pancreatic ductal dilatation or surrounding inflammatory changes. Spleen: Normal in size without focal abnormality. Adrenals/Urinary Tract: The adrenal glands are unremarkable. There is no hydronephrosis on either side. Subcentimeter right renal hypodense focus is too small to characterize. The visualized ureters and urinary bladder appear  unremarkable. Stomach/Bowel: There is no bowel obstruction or active inflammation. The appendix contains appendicoliths otherwise unremarkable. Vascular/Lymphatic: No significant vascular findings are present. No enlarged abdominal or pelvic lymph nodes. Reproductive: Uterus and bilateral adnexa are unremarkable. Other: None Musculoskeletal: No acute osseous pathology. Coarsened and thickened appearance of the left pelvic bone similar to prior study. IMPRESSION: No acute intra-abdominal or pelvic pathology. Interval resolution of the previously seen bowel inflammation. Electronically Signed   By: Anner Crete M.D.   On: 09/02/2018 21:24   Dg Chest Port 1 View  Result Date: 09/02/2018 CLINICAL DATA:  Altered mental status EXAM: PORTABLE CHEST 1 VIEW COMPARISON:  None. FINDINGS: Cardiac shadow is at the upper limits of normal in size. Dialysis catheter is noted in satisfactory position. The lungs are clear. No bony abnormality is noted. IMPRESSION: No active disease. Electronically Signed   By: Inez Catalina M.D.   On: 09/02/2018 19:28   Dg Fluoro Guide Lumbar Puncture  Result Date: 09/04/2018 CLINICAL DATA:  Confusion and headache. EXAM: DIAGNOSTIC LUMBAR PUNCTURE UNDER FLUOROSCOPIC GUIDANCE  FLUOROSCOPY TIME:  Fluoroscopy Time:  0 minutes, 18 seconds Radiation Exposure Index (if provided by the fluoroscopic device): 2.6 mGy Number of Acquired Spot Images: 0 PROCEDURE: I discussed the risks (including hemorrhage, infection, headache, and nerve damage, among others), benefits, and alternatives to fluoroscopically guided lumbar puncture with the patient. We specifically discussed the high technical likelihood of success of the procedure. The patient understood and elected to undergo the procedure. Standard time-out was employed. Following sterile skin prep and local anesthetic administration consisting of 1 percent lidocaine, a 20 gauge spinal needle was advanced without difficulty into the thecal sac at the  at the L3-4 level under fluoroscopic guidance. Clear CSF was returned. The patient did not want to turn on her side to obtain the opening pressure, and grew tearfull. Accordingly, opening pressure was obtained with the patient prone. Initially the opening pressure was about 35 cm of water, but this appeared to reduce as the patient calmed down, and ended up at about 25 cm of water. 12 cc of clear CSF was collected. The needle was subsequently removed and the skin cleansed and bandaged. No immediate complications were observed. IMPRESSION: 1. Technically successful fluoroscopically guided lumbar puncture at the L3-4 level yielding 12 cc of clear CSF. Initially the opening pressure seems significantly elevated at about 35 cm of water, although the patient was prone and somewhat tearful/agitated. Once the patient calmed down the CSF pressure reduced to 25 cm of water. Electronically Signed   By: Van Clines M.D.   On: 09/04/2018 11:50       Subjective: No headache anymore, no neck pain, no photophobia.  No focal weakness, slurred speech.  No hallucinations, no aural halluciation.   Discharge Exam: Vitals:   09/04/18 0833 09/04/18 1140  BP: (!) 152/85 (!) 175/96  Pulse: 90 94  Resp: 16 20  Temp: 98.3 F (36.8 C) 98.7 F (37.1 C)  SpO2: 97% 100%   Vitals:   09/04/18 0522 09/04/18 0833 09/04/18 0900 09/04/18 1140  BP:  (!) 152/85  (!) 175/96  Pulse:  90  94  Resp:  16  20  Temp:  98.3 F (36.8 C)  98.7 F (37.1 C)  TempSrc:  Oral  Oral  SpO2:  97%  100%  Weight: 77.6 kg  77.6 kg   Height:   5\' 7"  (1.702 m)     General: Pt is alert, awake, not in acute distress Cardiovascular: RRR, nl S1-S2, no murmurs appreciated.   No LE edema.   Respiratory: Normal respiratory rate and rhythm.  CTAB without rales or wheezes. Abdominal: Abdomen soft and non-tender.  No distension or HSM.   Neuro/Psych: Strength symmetric in upper and lower extremities.  Judgment and insight appear  normal.   The results of significant diagnostics from this hospitalization (including imaging, microbiology, ancillary and laboratory) are listed below for reference.     Microbiology: Recent Results (from the past 240 hour(s))  SARS Coronavirus 2 [facility] order, Performed in [facility] hospital lab)     Status: None   Collection Time: 09/03/18 12:44 AM  Result Value Ref Range Status   SARS Coronavirus 2 NEGATIVE NEGATIVE Final    Comment: (NOTE) If result is NEGATIVE SARS-CoV-2 target nucleic acids are NOT DETECTED. The SARS-CoV-2 RNA is generally detectable in upper and lower  respiratory specimens during the acute phase of infection. The lowest  concentration of SARS-CoV-2 viral copies this assay can detect is 250  copies / mL. A negative result does not preclude SARS-CoV-2  infection  and should not be used as the sole basis for treatment or other  patient management decisions.  A negative result may occur with  improper specimen collection / handling, submission of specimen other  than nasopharyngeal swab, presence of viral mutation(s) within the  areas targeted by this assay, and inadequate number of viral copies  (<250 copies / mL). A negative result must be combined with clinical  observations, patient history, and epidemiological information. If result is POSITIVE SARS-CoV-2 target nucleic acids are DETECTED. The SARS-CoV-2 RNA is generally detectable in upper and lower  respiratory specimens dur ing the acute phase of infection.  Positive  results are indicative of active infection with SARS-CoV-2.  Clinical  correlation with patient history and other diagnostic information is  necessary to determine patient infection status.  Positive results do  not rule out bacterial infection or co-infection with other viruses. If result is PRESUMPTIVE POSTIVE SARS-CoV-2 nucleic acids MAY BE PRESENT.   A presumptive positive result was obtained on the submitted specimen  and  confirmed on repeat testing.  While 2019 novel coronavirus  (SARS-CoV-2) nucleic acids may be present in the submitted sample  additional confirmatory testing may be necessary for epidemiological  and / or clinical management purposes  to differentiate between  SARS-CoV-2 and other Sarbecovirus currently known to infect humans.  If clinically indicated additional testing with an alternate test  methodology [phone]) is advised. The SARS-CoV-2 RNA is generally  detectable in upper and lower respiratory sp ecimens during the acute  phase of infection. The expected result is Negative. Fact Sheet for Patients:  StrictlyIdeas.no Fact Sheet for Healthcare Providers: BankingDealers.co.za This test is not yet approved or cleared by the [place] FDA and has been authorized for detection and/or diagnosis of SARS-CoV-2 by FDA under an Emergency Use Authorization (EUA).  This EUA will remain in effect (meaning this test can be used) for the duration of the COVID-19 declaration under Section 564(b)(1) of the Act, 21 U.S.C. section 360bbb-3(b)(1), unless the authorization is terminated or revoked sooner. Performed at [facility], [place] [address], [place], [place] [address]   Culture, blood (routine x 2)     Status: None (Preliminary result)   Collection Time: 09/03/18  2:20 AM  Result Value Ref Range Status   Specimen Description BLOOD RIGHT ARM  Final   Special Requests   Final    BOTTLES DRAWN AEROBIC AND ANAEROBIC Blood Culture adequate volume   Culture   Final    NO GROWTH 1 DAY Performed at [facility], [place] [address] [place], [place] [address]    Report Status PENDING  Incomplete  Culture, blood (routine x 2)     Status: None (Preliminary result)   Collection Time: 09/03/18  2:35 AM  Result Value Ref Range Status   Specimen Description BLOOD LEFT HAND  Final   Special Requests   Final    BOTTLES DRAWN AEROBIC AND  ANAEROBIC Blood Culture adequate volume   Culture   Final    NO GROWTH 1 DAY Performed at [facility], [place] [address] [place], [place] [address]    Report Status PENDING  Incomplete  MRSA PCR Screening     Status: None   Collection Time: 09/03/18  6:13 AM  Result Value Ref Range Status   MRSA by PCR NEGATIVE NEGATIVE Final    Comment:        The GeneXpert MRSA Assay (FDA approved for NASAL specimens only), is  one component of a comprehensive MRSA colonization surveillance program. It is not intended to diagnose MRSA infection nor to guide or monitor treatment for MRSA infections. Performed at [facility], [place] [address] [place], [place] [address]   CSF culture     Status: None (Preliminary result)   Collection Time: 09/04/18 11:19 AM  Result Value Ref Range Status   Specimen Description CSF  Final   Special Requests NONE  Final   Gram Stain   Final    WBC PRESENT, PREDOMINANTLY MONONUCLEAR NO ORGANISMS SEEN CYTOSPIN SMEAR Performed at [facility], [place] [address], [place], [place] [address]    Culture PENDING  Incomplete   Report Status PENDING  Incomplete     Labs: BNP (last 3 results) No results for input(s): BNP in the last 8760 hours. Basic Metabolic Panel: Recent Labs  Lab 09/02/18 1909 09/02/18 1919 09/03/18 0447 09/03/18 1042 09/04/18 0426  NA 131* 132* 130* 133* 136  K 3.5 3.6 4.7 4.4 4.3  CL 96*  --  90* 91* 96*  CO2 20*  --  19* 21* 27  GLUCOSE 382*  --  381* 359* 215*  BUN 28*  --  33* 37* 18  CREATININE 8.18*  --  8.87* 9.27* 6.56*  CALCIUM 9.6  --  9.8 10.0 9.1  PHOS  --   --   --  3.1  --    Liver Function Tests: Recent Labs  Lab 09/02/18 1909 09/03/18 0447 09/03/18 1042  AST 19 16  --   ALT 14 13  --   ALKPHOS 127* 130*  --   BILITOT 0.5 0.5  --   PROT 7.4 7.2  --   ALBUMIN 3.6 3.6 3.6   No results for input(s): LIPASE, AMYLASE in the last 168 hours. Recent Labs  Lab 09/03/18 0223 09/03/18 1042  AMMONIA  26 25   CBC: Recent Labs  Lab 09/02/18 1909 09/02/18 1919 09/03/18 0447 09/04/18 0426  WBC 10.2  --  14.4* 9.0  NEUTROABS 8.6*  --  13.5*  --   HGB 12.4 12.2 12.6 10.8*  HCT 36.7 36.0 37.5 33.7*  MCV 78.4*  --  79.1* 81.6  PLT 317  --  340 312   Cardiac Enzymes: No results for input(s): CKTOTAL, CKMB, CKMBINDEX, TROPONINI in the last 168 hours. BNP: Invalid input(s): POCBNP CBG: Recent Labs  Lab 09/03/18 0707 09/03/18 0712 09/03/18 1733 09/04/18 0809 09/04/18 1154  GLUCAP 438* 425* 203* 255* 88   D-Dimer No results for input(s): DDIMER in the last 72 hours. Hgb A1c No results for input(s): HGBA1C in the last 72 hours. Lipid Profile No results for input(s): CHOL, HDL, LDLCALC, TRIG, CHOLHDL, LDLDIRECT in the last 72 hours. Thyroid function studies No results for input(s): TSH, T4TOTAL, T3FREE, THYROIDAB in the last 72 hours.  Invalid input(s): FREET3 Anemia work up No results for input(s): VITAMINB12, FOLATE, FERRITIN, TIBC, IRON, RETICCTPCT in the last 72 hours. Urinalysis    Component Value Date/Time   COLORURINE YELLOW 07/28/2018 0609   APPEARANCEUR CLOUDY (A) 07/28/2018 0609   APPEARANCEUR Clear 06/25/2017 1042   LABSPEC 1.024 07/28/2018 0609   PHURINE 5.0 07/28/2018 0609   GLUCOSEU 150 (A) 07/28/2018 0609   HGBUR SMALL (A) 07/28/2018 0609   BILIRUBINUR NEGATIVE 07/28/2018 0609   BILIRUBINUR neg 08/05/2017 1514   BILIRUBINUR Negative 06/25/2017 1042   KETONESUR NEGATIVE 07/28/2018 0609   PROTEINUR 100 (A) 07/28/2018 0609   UROBILINOGEN 0.2 08/05/2017 1514   NITRITE  NEGATIVE 07/28/2018 0609   LEUKOCYTESUR NEGATIVE 07/28/2018 0609   Sepsis Labs Invalid input(s): PROCALCITONIN,  WBC,  LACTICIDVEN Microbiology Recent Results (from the past 240 hour(s))  SARS Coronavirus 2 [facility] order, Performed in [place] hospital lab)     Status: None   Collection Time: 09/03/18 12:44 AM  Result Value Ref Range Status   SARS Coronavirus 2 NEGATIVE NEGATIVE  Final    Comment: (NOTE) If result is NEGATIVE SARS-CoV-2 target nucleic acids are NOT DETECTED. The SARS-CoV-2 RNA is generally detectable in upper and lower  respiratory specimens during the acute phase of infection. The lowest  concentration of SARS-CoV-2 viral copies this assay can detect is 250  copies / mL. A negative result does not preclude SARS-CoV-2 infection  and should not be used as the sole basis for treatment or other  patient management decisions.  A negative result may occur with  improper specimen collection / handling, submission of specimen other  than nasopharyngeal swab, presence of viral mutation(s) within the  areas targeted by this assay, and inadequate number of viral copies  (<250 copies / mL). A negative result must be combined with clinical  observations, patient history, and epidemiological information. If result is POSITIVE SARS-CoV-2 target nucleic acids are DETECTED. The SARS-CoV-2 RNA is generally detectable in upper and lower  respiratory specimens dur ing the acute phase of infection.  Positive  results are indicative of active infection with SARS-CoV-2.  Clinical  correlation with patient history and other diagnostic information is  necessary to determine patient infection status.  Positive results do  not rule out bacterial infection or co-infection with other viruses. If result is PRESUMPTIVE POSTIVE SARS-CoV-2 nucleic acids MAY BE PRESENT.   A presumptive positive result was obtained on the submitted specimen  and confirmed on repeat testing.  While 2019 novel coronavirus  (SARS-CoV-2) nucleic acids may be present in the submitted sample  additional confirmatory testing may be necessary for epidemiological  and / or clinical management purposes  to differentiate between  SARS-CoV-2 and other Sarbecovirus currently known to infect humans.  If clinically indicated additional testing with an alternate test  methodology [phone]) is advised. The  SARS-CoV-2 RNA is generally  detectable in upper and lower respiratory sp ecimens during the acute  phase of infection. The expected result is Negative. Fact Sheet for Patients:  StrictlyIdeas.no Fact Sheet for Healthcare Providers: BankingDealers.co.za This test is not yet approved or cleared by the [place] FDA and has been authorized for detection and/or diagnosis of SARS-CoV-2 by FDA under an Emergency Use Authorization (EUA).  This EUA will remain in effect (meaning this test can be used) for the duration of the COVID-19 declaration under Section 564(b)(1) of the Act, 21 U.S.C. section 360bbb-3(b)(1), unless the authorization is terminated or revoked sooner. Performed at [facility], [place] [address], [place], [place] [address]   Culture, blood (routine x 2)     Status: None (Preliminary result)   Collection Time: 09/03/18  2:20 AM  Result Value Ref Range Status   Specimen Description BLOOD RIGHT ARM  Final   Special Requests   Final    BOTTLES DRAWN AEROBIC AND ANAEROBIC Blood Culture adequate volume   Culture   Final    NO GROWTH 1 DAY Performed at [facility], [place] [address] [place], [place] [address]    Report Status PENDING  Incomplete  Culture, blood (routine x 2)     Status: None (Preliminary result)   Collection  Time: 09/03/18  2:35 AM  Result Value Ref Range Status   Specimen Description BLOOD LEFT HAND  Final   Special Requests   Final    BOTTLES DRAWN AEROBIC AND ANAEROBIC Blood Culture adequate volume   Culture   Final    NO GROWTH 1 DAY Performed at [facility], [address] [place], [place] [address]    Report Status PENDING  Incomplete  MRSA PCR Screening     Status: None   Collection Time: 09/03/18  6:13 AM  Result Value Ref Range Status   MRSA by PCR NEGATIVE NEGATIVE Final    Comment:        The GeneXpert MRSA Assay (FDA approved for NASAL specimens only), is one component  of a comprehensive MRSA colonization surveillance program. It is not intended to diagnose MRSA infection nor to guide or monitor treatment for MRSA infections. Performed at [facility], [place] [address], [place], [place] [address]   CSF culture     Status: None (Preliminary result)   Collection Time: 09/04/18 11:19 AM  Result Value Ref Range Status   Specimen Description CSF  Final   Special Requests NONE  Final   Gram Stain   Final    WBC PRESENT, PREDOMINANTLY MONONUCLEAR NO ORGANISMS SEEN CYTOSPIN SMEAR Performed at [facility], [place] [address], [place], [place] [address]    Culture PENDING  Incomplete   Report Status PENDING  Incomplete     Time coordinating discharge: 40 minutes       SIGNED:   Edwin Dada, MD  [facility] 09/04/2018, 4:56 PM

## 2018-09-04 NOTE — Progress Notes (Addendum)
NEURO HOSPITALIST PROGRESS NOTE   Subjective: Patient is awake, alert, NAD. Getting hooked up for routine EEG. Wishes to go home today because it is mother's day.  She is able  to tell me that she has type 1 diabetes and forgot to put her insulin pump back on the other day. This may have contributed to her hyperglycemia. LP was done with fluoro today, awaiting results.   Exam: Vitals:   09/04/18 0519 09/04/18 0833  BP: (!) 151/93 (!) 152/85  Pulse: 99 90  Resp: 16 16  Temp: 98.7 F (37.1 C) 98.3 F (36.8 C)  SpO2: 100% 97%    Physical Exam   HEENT-  Normocephalic, no lesions, without obvious abnormality.  Normal external eye and conjunctiva.   Cardiovascular- , pulses palpable throughout   Lungs- no excessive working breathing.  Saturations within normal limits Extremities- Warm, dry and intact  Neuro:  Mental Status: Alert, oriented to name/age/month/year/ situation, thought content appropriate.  Speech fluent without evidence of aphasia.  Able to follow  commands without difficulty. The speech arrest and apparent inability to follow most commands, which was noted on prior neurological exam, has resolved.  Cranial Nerves: II:  Visual fields grossly normal,  III,IV, VI: ptosis not present, extra-ocular motions intact bilaterally pupils equal, round, reactive to light and accommodation V,VII: smile symmetric, facial light touch sensation normal bilaterally VIII: hearing normal bilaterally IX,X: uvula rises symmetrically XI: bilateral shoulder shrug XII: midline tongue extension Motor: Right : Upper extremity   5/5    Left:     Upper extremity   5/5  Lower extremity   5/5     Lower extremity   5/5 Tone and bulk:normal tone throughout; no atrophy noted Sensory:  light touch intact throughout, bilaterally Deep Tendon Reflexes: 2+ and symmetric biceps, patella Cerebellar: normal finger-to-nose,  normal heel-to-shin test Gait: deferred    Medications:   Scheduled: . calcium acetate  667 mg Oral TID WC  . Chlorhexidine Gluconate Cloth  6 each Topical Q0600  . insulin aspart  0-15 Units Subcutaneous TID WC  . insulin aspart  0-5 Units Subcutaneous QHS  . insulin glargine  15 Units Subcutaneous QHS  . LORazepam  1 mg Intravenous Once  . metoprolol succinate  25 mg Oral Daily  . NIFEdipine  60 mg Oral BID  . paricalcitol  1 mcg Oral Daily   Continuous: . acyclovir 400 mg (09/03/18 2143)  . cefTRIAXone (ROCEPHIN)  IV 2 g (09/03/18 2249)  . [START ON 09/06/2018] vancomycin     ZOX:[id number] **OR** acetaminophen, clobetasol ointment, hydrALAZINE, labetalol, LORazepam, ondansetron **OR** ondansetron (ZOFRAN) IV  Pertinent Labs/Diagnostics:   Ct Head Wo Contrast  Result Date: 09/02/2018 CLINICAL DATA:  Altered mental status EXAM: CT HEAD WITHOUT CONTRAST TECHNIQUE: Contiguous axial images were obtained from the base of the skull through the vertex without intravenous contrast. COMPARISON:  None. FINDINGS: Brain: No evidence of acute infarction, hemorrhage, hydrocephalus, extra-axial collection or mass lesion/mass effect. Vascular: No hyperdense vessel or unexpected calcification. Skull: Normal. Negative for fracture or focal lesion. Sinuses/Orbits: No acute finding. Other: None. IMPRESSION: No acute intracranial abnormality noted. Electronically Signed   By: Inez Catalina M.D.   On: 09/02/2018 20:04   Mr Brain Wo Contrast  Result Date: 09/04/2018 CLINICAL DATA:  Worsening confusion. History of diabetes and end-stage renal disease. EXAM: MRI HEAD WITHOUT CONTRAST TECHNIQUE: Multiplanar, multiecho pulse  sequences of the brain and surrounding structures were obtained without intravenous contrast. COMPARISON:  Head CT 09/02/2018 and MRI 07/03/2017 FINDINGS: Brain: There is no evidence of acute infarct, intracranial hemorrhage, mass, midline shift, or extra-axial fluid collection. The ventricles and sulci are normal. The brain is normal in signal.  Vascular: Major intracranial vascular flow voids are preserved. Skull and upper cervical spine: Unremarkable bone marrow signal. Sinuses/Orbits: Unremarkable orbits. Paranasal sinuses and mastoid air cells are clear. Other: Small lymph nodes in the upper neck bilaterally, decreased in size from the prior MRI. IMPRESSION: Negative brain MRI. Electronically Signed   By: Logan Bores M.D.   On: 09/04/2018 09:22   Ct Abdomen Pelvis W Contrast  Result Date: 09/02/2018 CLINICAL DATA:  33 year old female with abdominal pain, nausea vomiting. EXAM: CT ABDOMEN AND PELVIS WITH CONTRAST TECHNIQUE: Multidetector CT imaging of the abdomen and pelvis was performed using the standard protocol following bolus administration of intravenous contrast. CONTRAST:  19mL OMNIPAQUE IOHEXOL 300 MG/ML  SOLN COMPARISON:  CT of the abdomen pelvis dated 07/27/2018 FINDINGS: Lower chest: There is mild cardiomegaly. The tip of central line noted in the right atrium, likely related to a dialysis catheter. The visualized lung bases are clear. No intra-abdominal free air or free fluid. Hepatobiliary: No focal liver abnormality is seen. No gallstones, gallbladder wall thickening, or biliary dilatation. Pancreas: Unremarkable. No pancreatic ductal dilatation or surrounding inflammatory changes. Spleen: Normal in size without focal abnormality. Adrenals/Urinary Tract: The adrenal glands are unremarkable. There is no hydronephrosis on either side. Subcentimeter right renal hypodense focus is too small to characterize. The visualized ureters and urinary bladder appear unremarkable. Stomach/Bowel: There is no bowel obstruction or active inflammation. The appendix contains appendicoliths otherwise unremarkable. Vascular/Lymphatic: No significant vascular findings are present. No enlarged abdominal or pelvic lymph nodes. Reproductive: Uterus and bilateral adnexa are unremarkable. Other: None Musculoskeletal: No acute osseous pathology. Coarsened and  thickened appearance of the left pelvic bone similar to prior study. IMPRESSION: No acute intra-abdominal or pelvic pathology. Interval resolution of the previously seen bowel inflammation. Electronically Signed   By: Anner Crete M.D.   On: 09/02/2018 21:24   Dg Chest Port 1 View  Result Date: 09/02/2018 CLINICAL DATA:  Altered mental status EXAM: PORTABLE CHEST 1 VIEW COMPARISON:  None. FINDINGS: Cardiac shadow is at the upper limits of normal in size. Dialysis catheter is noted in satisfactory position. The lungs are clear. No bony abnormality is noted. IMPRESSION: No active disease. Electronically Signed   By: Inez Catalina M.D.   On: 09/02/2018 19:28    Assessment: 33 year old female with ESRD on HD, presenting with encephalopathy versus psychosis versus psychogenic unresponsiveness 1. Had some lab and vitals abnormalities suggestive of a possible organic etiology, including hyperglycemia, HTN, initial symptoms of headache with vomiting and elevated white count.  2. Intermittent normal verbal responses to immediate concerns such as noxious stimuli and at one point stating to a consultant "leave me alone, I just want to go home" are suggestive of a psychogenic etiology as an encephalopathy generally would result in dysarthric, slow speech with loss of prosody and increased latency of responses, none of which manifest in this patient when she does speak.  3. No evidence for clinical seizure activity on exam.  4. No neck stiffness to suggest a meningitis. Not currently febrile. Elevated WBC most likely due to another infectious etiology. LP 09/04/2018 done with fluoro ( pending results).  Currently receiving ceftriaxone IV and acyclovir IV 5. CT head shows  no acute abnormality.  6. Negative brain MRI 7. Anti-NMDA pending (send out lab) 8. EEG 09/04/2018: This electroencephalogram shows evidence of normal wakefulness with intermittent periods of slowing of unclear significance.  These may represent  intermittent drowse but can not rule out of an encephalopathy in the differential as well.  Clinical correlation recommended.  No epileptiform activity is noted.     Recommendations: 1. Anti-NMDA antibody ordered. Will need to be followed up as outpatient as this is a send-out lab.  2. Neurology to sign- off at this time. Please call if LP results are abnormal or with any further questions or concerns.   Laurey Morale, MSN, NP-C [facility] [phone]  Electronically signed: Dr. Kerney Elbe 09/04/2018, 9:50 AM

## 2018-09-04 NOTE — Procedures (Signed)
CLINICAL DATA: Confusion and headache.  EXAM:  DIAGNOSTIC LUMBAR PUNCTURE UNDER FLUOROSCOPIC GUIDANCE  FLUOROSCOPY TIME: Fluoroscopy Time:  0 minutes, 18 seconds  Radiation Exposure Index (if provided by the fluoroscopic device):  2.6 mGy  Number of Acquired Spot Images: 0  PROCEDURE:  I discussed the risks (including hemorrhage, infection, headache, and nerve damage, among others), benefits, and alternatives to fluoroscopically guided lumbar puncture with the patient.  We specifically discussed the high technical likelihood of success of the procedure. The patient understood and elected to undergo the procedure.    Standard time-out was employed.  Following sterile skin prep and local anesthetic administration consisting of 1 percent lidocaine, a 20 gauge spinal needle was advanced without difficulty into the thecal sac at the at the L3-4 level under fluoroscopic guidance.  Clear CSF was returned.  The patient did not want to turn on her side to obtain the opening pressure, and grew tearfull.  Accordingly, opening pressure was obtained with the patient prone.  Initially the opening pressure was about 35 cm of water, but this appeared to reduce as the patient calmed down, and ended up at about 25 cm of water.   12 cc of clear CSF was collected.  The needle was subsequently removed and the skin cleansed and bandaged.  No immediate complications were observed.    IMPRESSION:  Technically successful fluoroscopically guided lumbar puncture at the L3-4 level yielding 12 cc of clear CSF.  Initially the opening pressure seems significantly elevated at about 35 cm of water, although the patient was prone and somewhat tearful/agitated.  Once the patient calmed down the CSF pressure reduced to 25 cm of water.

## 2018-09-04 NOTE — Progress Notes (Signed)
EEG Complete. Results pending. 

## 2018-09-05 LAB — GLUCOSE, CAPILLARY
Glucose-Capillary: 381 mg/dL — ABNORMAL HIGH (ref 70–99)
Glucose-Capillary: 334 mg/dL — ABNORMAL HIGH (ref 70–99)
Glucose-Capillary: 196 mg/dL — ABNORMAL HIGH (ref 70–99)
Glucose-Capillary: 191 mg/dL — ABNORMAL HIGH (ref 70–99)

## 2018-09-06 LAB — ANA W/REFLEX IF POSITIVE: Anti Nuclear Antibody (ANA): NEGATIVE

## 2018-09-06 LAB — ANTI-DNA ANTIBODY, DOUBLE-STRANDED: ds DNA Ab: 1 IU/mL (ref 0–9)

## 2018-09-07 LAB — CSF CULTURE W GRAM STAIN: Culture: NO GROWTH

## 2018-09-07 LAB — CSF CULTURE

## 2018-09-08 LAB — CULTURE, BLOOD (ROUTINE X 2)
Culture: NO GROWTH
Culture: NO GROWTH
Special Requests: ADEQUATE
Special Requests: ADEQUATE

## 2018-09-15 ENCOUNTER — Encounter: Payer: Medicaid Other | Admitting: Obstetrics and Gynecology

## 2018-09-26 ENCOUNTER — Inpatient Hospital Stay: Payer: Medicare Other

## 2018-09-26 ENCOUNTER — Inpatient Hospital Stay (HOSPITAL_BASED_OUTPATIENT_CLINIC_OR_DEPARTMENT_OTHER): Payer: Medicare Other | Admitting: Internal Medicine

## 2018-09-26 ENCOUNTER — Inpatient Hospital Stay: Payer: Medicare Other | Attending: Internal Medicine

## 2018-09-26 ENCOUNTER — Other Ambulatory Visit: Payer: Self-pay

## 2018-09-26 VITALS — BP 134/95 | HR 83 | Resp 20

## 2018-09-26 VITALS — BP 127/92 | HR 86 | Temp 98.0°F | Resp 20 | Ht 67.0 in | Wt 178.0 lb

## 2018-09-26 DIAGNOSIS — D5939 Other hemolytic-uremic syndrome: Secondary | ICD-10-CM

## 2018-09-26 DIAGNOSIS — I120 Hypertensive chronic kidney disease with stage 5 chronic kidney disease or end stage renal disease: Secondary | ICD-10-CM | POA: Diagnosis not present

## 2018-09-26 DIAGNOSIS — E1122 Type 2 diabetes mellitus with diabetic chronic kidney disease: Secondary | ICD-10-CM | POA: Diagnosis not present

## 2018-09-26 DIAGNOSIS — Z79899 Other long term (current) drug therapy: Secondary | ICD-10-CM | POA: Insufficient documentation

## 2018-09-26 DIAGNOSIS — D593 Hemolytic-uremic syndrome: Secondary | ICD-10-CM

## 2018-09-26 DIAGNOSIS — N186 End stage renal disease: Secondary | ICD-10-CM | POA: Insufficient documentation

## 2018-09-26 DIAGNOSIS — Z992 Dependence on renal dialysis: Secondary | ICD-10-CM | POA: Diagnosis not present

## 2018-09-26 LAB — CBC WITH DIFFERENTIAL/PLATELET
Abs Immature Granulocytes: 0.01 10*3/uL (ref 0.00–0.07)
Basophils Absolute: 0.1 10*3/uL (ref 0.0–0.1)
Basophils Relative: 1 %
Eosinophils Absolute: 0.2 10*3/uL (ref 0.0–0.5)
Eosinophils Relative: 4 %
HCT: 37.9 % (ref 36.0–46.0)
Hemoglobin: 12.2 g/dL (ref 12.0–15.0)
Immature Granulocytes: 0 %
Lymphocytes Relative: 20 %
Lymphs Abs: 1.1 10*3/uL (ref 0.7–4.0)
MCH: 26.8 pg (ref 26.0–34.0)
MCHC: 32.2 g/dL (ref 30.0–36.0)
MCV: 83.1 fL (ref 80.0–100.0)
Monocytes Absolute: 0.4 10*3/uL (ref 0.1–1.0)
Monocytes Relative: 7 %
Neutro Abs: 3.6 10*3/uL (ref 1.7–7.7)
Neutrophils Relative %: 68 %
Platelets: 375 10*3/uL (ref 150–400)
RBC: 4.56 MIL/uL (ref 3.87–5.11)
RDW: 15.6 % — ABNORMAL HIGH (ref 11.5–15.5)
WBC: 5.4 10*3/uL (ref 4.0–10.5)
nRBC: 0 % (ref 0.0–0.2)

## 2018-09-26 LAB — COMPREHENSIVE METABOLIC PANEL
ALT: 10 U/L (ref 0–44)
AST: 15 U/L (ref 15–41)
Albumin: 3.6 g/dL (ref 3.5–5.0)
Alkaline Phosphatase: 121 U/L (ref 38–126)
Anion gap: 13 (ref 5–15)
BUN: 39 mg/dL — ABNORMAL HIGH (ref 6–20)
CO2: 24 mmol/L (ref 22–32)
Calcium: 8.9 mg/dL (ref 8.9–10.3)
Chloride: 98 mmol/L (ref 98–111)
Creatinine, Ser: 9.34 mg/dL — ABNORMAL HIGH (ref 0.44–1.00)
GFR calc Af Amer: 6 mL/min — ABNORMAL LOW (ref >60)
GFR calc non Af Amer: 5 mL/min — ABNORMAL LOW (ref >60)
Glucose, Bld: 200 mg/dL — ABNORMAL HIGH (ref 70–99)
Potassium: 5.1 mmol/L (ref 3.5–5.1)
Sodium: 135 mmol/L (ref 135–145)
Total Bilirubin: 0.3 mg/dL (ref 0.3–1.2)
Total Protein: 7.6 g/dL (ref 6.5–8.1)

## 2018-09-26 LAB — LACTATE DEHYDROGENASE: LDH: 262 U/L — ABNORMAL HIGH (ref 98–192)

## 2018-09-26 MED ORDER — SODIUM CHLORIDE 0.9 % IV SOLN
Freq: Once | INTRAVENOUS | Status: AC
  Administered 2018-09-26: 11:00:00 via INTRAVENOUS
  Filled 2018-09-26: qty 250

## 2018-09-26 MED ORDER — SODIUM CHLORIDE 0.9 % IV SOLN
3300.0000 mg | INTRAVENOUS | Status: DC
  Administered 2018-09-26: 3300 mg via INTRAVENOUS
  Filled 2018-09-26: qty 330

## 2018-09-26 NOTE — Progress Notes (Signed)
Kelly Huff NOTE  Patient Care Team: Rubie Maid, MD as PCP - General (Obstetrics and Gynecology) [name], FNP as Consulting Physician (Family Medicine) Azzie Glatter, MD as Consulting Physician (Internal Medicine) Sherryll Burger, MD as Consulting Physician (Nephrology)  CHIEF COMPLAINTS/PURPOSE OF CONSULTATION: Atypical HUS  # ATYPICAL HEMOLYTIC UREMIC SYNDROME [April 2019-Dx; [facility]; Dr.Arepally;s/p Plex x1; April 26th- Ecluzimab 1200 mg q 2W; march 25th 2020- Ultomiris q  8 weeks.   # AKI/CKD [name] 2018;FEB 2020- hemodialysis [[facility] in [place]; Tuesday Thursday Saturday]  # IDDM [since age of 84- Dr.Kerr,/Endo]  # WORK UP for HUS [[facility]] Phospholipase A2 Receptor AB/ S Phospholipase A2 Receptor IFA-NEGATIVE    No history exists.     HISTORY OF PRESENTING ILLNESS:  Kelly Huff 33 y.o.  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  Patient episode of headaches/mental status changes for which she was evaluated in the hospital-with MRI brain and also lumbar puncture rule out meningitis.  It was thought her symptoms were from elevated blood pressure.  Currently symptoms are resolved.  Patient continues to get hemodialysis.  She is awaiting to start peritoneal dialysis next week.  Denies any headaches or nausea vomiting at this time.  Review of Systems  Constitutional: Positive for malaise/fatigue. Negative for chills, diaphoresis, fever and weight loss.  HENT: Negative for nosebleeds and sore throat.   Eyes: Negative for double vision.  Respiratory: Negative for cough, hemoptysis, sputum production, shortness of breath and wheezing.   Cardiovascular: Negative for chest pain, palpitations, orthopnea and leg swelling.  Gastrointestinal: Negative for abdominal pain, blood in stool, constipation, diarrhea, heartburn, melena, nausea and vomiting.  Genitourinary: Negative for  dysuria, frequency and urgency.  Musculoskeletal: Negative for back pain and joint pain.  Skin: Negative.  Negative for itching and rash.  Neurological: Negative for dizziness, tingling, focal weakness, weakness and headaches.  Endo/Heme/Allergies: Does not bruise/bleed easily.  Psychiatric/Behavioral: Negative for depression. The patient is not nervous/anxious and does not have insomnia.     MEDICAL HISTORY:  Past Medical History:  Diagnosis Date  . Chronic kidney disease   . Diabetes mellitus without complication ([place])   . History of pre-eclampsia 2016   mild  . Hypertension     SURGICAL HISTORY: Past Surgical History:  Procedure Laterality Date  . CESAREAN SECTION    . DILATION AND CURETTAGE OF UTERUS     x 4    SOCIAL HISTORY: Social History   Socioeconomic History  . Marital status: Married    Spouse name: Not on file  . Number of children: Not on file  . Years of education: Not on file  . Highest education level: Not on file  Occupational History  . Not on file  Social Needs  . Financial resource strain: Not on file  . Food insecurity:    Worry: Not on file    Inability: Not on file  . Transportation needs:    Medical: Not on file    Non-medical: Not on file  Tobacco Use  . Smoking status: Never Smoker  . Smokeless tobacco: Never Used  Substance and Sexual Activity  . Alcohol use: No  . Drug use: No  . Sexual activity: Yes    Birth control/protection: None, Surgical    Comment: tubial  Lifestyle  . Physical activity:    Days per week: Not on file    Minutes per session: Not on file  . Stress: Not on  file  Relationships  . Social connections:    Talks on phone: Not on file    Gets together: Not on file    Attends religious service: Not on file    Active member of club or organization: Not on file    Attends meetings of clubs or organizations: Not on file    Relationship status: Not on file  . Intimate partner violence:    Fear of current or ex  partner: Not on file    Emotionally abused: Not on file    Physically abused: Not on file    Forced sexual activity: Not on file  Other Topics Concern  . Not on file  Social History Narrative  . Not on file    FAMILY HISTORY: Family History  Problem Relation Age of Onset  . Hypertension Mother   . Cancer Mother        Uterine vs cervical (pt unsure),   . Schizophrenia Brother   . Breast cancer Neg Hx     ALLERGIES:  is allergic to morphine; sulfamethoxazole-trimethoprim; and trulicity [dulaglutide].  MEDICATIONS:  Current Outpatient Medications  Medication Sig Dispense Refill  . B Complex-C-Folic Acid (RENO CAPS) 1 MG CAPS Take 1 mg by mouth daily.    . calcium acetate (PHOSLO) 667 MG capsule Take 667 mg by mouth 3 (three) times daily.     . clobetasol ointment (TEMOVATE) 9.32 % Apply 1 application topically 2 (two) times daily as needed for itching.    . cyclobenzaprine (FLEXERIL) 10 MG tablet Take 1 tablet by mouth as needed for muscle pain.    Marland Kitchen gentamicin cream (GARAMYCIN) 0.1 % APPLY SMALL AMOUNT TO CATHETER EXIT SITE ONCE DAILY    . Insulin Aspart (NOVOLOG Alvin) Inject 0-20 Units into the skin See admin instructions. Uses with Insulin Pump    . Insulin Human (INSULIN PUMP) SOLN Inject into the skin continuous. Novolog    . metoprolol succinate (TOPROL-XL) 50 MG 24 hr tablet Take 50 mg by mouth daily.    . metoprolol succinate (TOPROL-XL) 50 MG 24 hr tablet Take 1 tablet by mouth 2 (two) times a day.    Marland Kitchen NIFEdipine (PROCARDIA XL/NIFEDICAL XL) 60 MG 24 hr tablet Take 60 mg by mouth 2 (two) times a day.    . ondansetron (ZOFRAN ODT) 4 MG disintegrating tablet Take 1 tablet (4 mg total) by mouth every 8 (eight) hours as needed for nausea or vomiting. 20 tablet 0  . paricalcitol (ZEMPLAR) 1 MCG capsule Take 1 mcg by mouth daily.     No current facility-administered medications for this visit.    Facility-Administered Medications Ordered in Other Visits  Medication Dose Route  Frequency Provider Last Rate Last Dose  . ravulizumab-cwvz (ULTOMIRIS) 3,300 mg in sodium chloride 0.9 % 660 mL (5 mg/mL) infusion  3,300 mg Intravenous Q8 Elsie Saas R, MD 330 mL/hr at 09/26/18 1159 3,300 mg at 09/26/18 1159      .  PHYSICAL EXAMINATION: ECOG PERFORMANCE STATUS: 1 - Symptomatic but completely ambulatory  Vitals:   09/26/18 1013  BP: (!) 127/92  Pulse: 86  Resp: 20  Temp: 98 F (36.7 C)   Filed Weights   09/26/18 1013  Weight: 178 lb (80.7 kg)    Physical Exam  Constitutional: She is oriented to person, place, and time and well-developed, well-nourished, and in no distress.  Patient is alone.  HENT:  Head: Normocephalic and atraumatic.  Mouth/Throat: Oropharynx is clear and moist. No oropharyngeal exudate.  Eyes: Pupils are equal, round, and reactive to light.  Neck: Normal range of motion. Neck supple.  Cardiovascular: Normal rate and regular rhythm.  Pulmonary/Chest: No respiratory distress. She has no wheezes.  Abdominal: Soft. Bowel sounds are normal. She exhibits no distension and no mass. There is no abdominal tenderness. There is no rebound and no guarding.  Musculoskeletal: Normal range of motion.        General: No tenderness or edema.  Neurological: She is alert and oriented to person, place, and time.  Skin: Skin is warm.  Psychiatric: Affect normal.  ;  LABORATORY DATA:  I have reviewed the data as listed Lab Results  Component Value Date   WBC 5.4 09/26/2018   HGB 12.2 09/26/2018   HCT 37.9 09/26/2018   MCV 83.1 09/26/2018   PLT 375 09/26/2018   Recent Labs    09/02/18 1909  09/03/18 0447 09/03/18 1042 09/04/18 0426 09/26/18 0947  NA 131*   < > 130* 133* 136 135  K 3.5   < > 4.7 4.4 4.3 5.1  CL 96*  --  90* 91* 96* 98  CO2 20*  --  19* 21* 27 24  GLUCOSE 382*  --  381* 359* 215* 200*  BUN 28*  --  33* 37* 18 39*  CREATININE 8.18*  --  8.87* 9.27* 6.56* 9.34*  CALCIUM 9.6  --  9.8 10.0 9.1 8.9  GFRNONAA 6*   --  5* 5* 8* 5*  GFRAA 7*  --  6* 6* 9* 6*  PROT 7.4  --  7.2  --   --  7.6  ALBUMIN 3.6  --  3.6 3.6  --  3.6  AST 19  --  16  --   --  15  ALT 14  --  13  --   --  10  ALKPHOS 127*  --  130*  --   --  121  BILITOT 0.5  --  0.5  --   --  0.3  BILIDIR  --   --  <0.1  --   --   --   IBILI  --   --  NOT CALCULATED  --   --   --    < > = values in this interval not displayed.    RADIOGRAPHIC STUDIES: I have personally reviewed the radiological images as listed and agreed with the findings in the report. Ct Head Wo Contrast  Result Date: 09/02/2018 CLINICAL DATA:  Altered mental status EXAM: CT HEAD WITHOUT CONTRAST TECHNIQUE: Contiguous axial images were obtained from the base of the skull through the vertex without intravenous contrast. COMPARISON:  None. FINDINGS: Brain: No evidence of acute infarction, hemorrhage, hydrocephalus, extra-axial collection or mass lesion/mass effect. Vascular: No hyperdense vessel or unexpected calcification. Skull: Normal. Negative for fracture or focal lesion. Sinuses/Orbits: No acute finding. Other: None. IMPRESSION: No acute intracranial abnormality noted. Electronically Signed   By: Inez Catalina M.D.   On: 09/02/2018 20:04   Mr Brain Wo Contrast  Result Date: 09/04/2018 CLINICAL DATA:  Worsening confusion. History of diabetes and end-stage renal disease. EXAM: MRI HEAD WITHOUT CONTRAST TECHNIQUE: Multiplanar, multiecho pulse sequences of the brain and surrounding structures were obtained without intravenous contrast. COMPARISON:  Head CT 09/02/2018 and MRI 07/03/2017 FINDINGS: Brain: There is no evidence of acute infarct, intracranial hemorrhage, mass, midline shift, or extra-axial fluid collection. The ventricles and sulci are normal. The brain is normal in signal. Vascular: Major intracranial vascular flow voids are preserved.  Skull and upper cervical spine: Unremarkable bone marrow signal. Sinuses/Orbits: Unremarkable orbits. Paranasal sinuses and mastoid air  cells are clear. Other: Small lymph nodes in the upper neck bilaterally, decreased in size from the prior MRI. IMPRESSION: Negative brain MRI. Electronically Signed   By: Logan Bores M.D.   On: 09/04/2018 09:22   Ct Abdomen Pelvis W Contrast  Result Date: 09/02/2018 CLINICAL DATA:  33 year old female with abdominal pain, nausea vomiting. EXAM: CT ABDOMEN AND PELVIS WITH CONTRAST TECHNIQUE: Multidetector CT imaging of the abdomen and pelvis was performed using the standard protocol following bolus administration of intravenous contrast. CONTRAST:  161mL OMNIPAQUE IOHEXOL 300 MG/ML  SOLN COMPARISON:  CT of the abdomen pelvis dated 07/27/2018 FINDINGS: Lower chest: There is mild cardiomegaly. The tip of central line noted in the right atrium, likely related to a dialysis catheter. The visualized lung bases are clear. No intra-abdominal free air or free fluid. Hepatobiliary: No focal liver abnormality is seen. No gallstones, gallbladder wall thickening, or biliary dilatation. Pancreas: Unremarkable. No pancreatic ductal dilatation or surrounding inflammatory changes. Spleen: Normal in size without focal abnormality. Adrenals/Urinary Tract: The adrenal glands are unremarkable. There is no hydronephrosis on either side. Subcentimeter right renal hypodense focus is too small to characterize. The visualized ureters and urinary bladder appear unremarkable. Stomach/Bowel: There is no bowel obstruction or active inflammation. The appendix contains appendicoliths otherwise unremarkable. Vascular/Lymphatic: No significant vascular findings are present. No enlarged abdominal or pelvic lymph nodes. Reproductive: Uterus and bilateral adnexa are unremarkable. Other: None Musculoskeletal: No acute osseous pathology. Coarsened and thickened appearance of the left pelvic bone similar to prior study. IMPRESSION: No acute intra-abdominal or pelvic pathology. Interval resolution of the previously seen bowel inflammation.  Electronically Signed   By: Anner Crete M.D.   On: 09/02/2018 21:24   Dg Chest Port 1 View  Result Date: 09/02/2018 CLINICAL DATA:  Altered mental status EXAM: PORTABLE CHEST 1 VIEW COMPARISON:  None. FINDINGS: Cardiac shadow is at the upper limits of normal in size. Dialysis catheter is noted in satisfactory position. The lungs are clear. No bony abnormality is noted. IMPRESSION: No active disease. Electronically Signed   By: Inez Catalina M.D.   On: 09/02/2018 19:28   Dg Fluoro Guide Lumbar Puncture  Result Date: 09/04/2018 CLINICAL DATA:  Confusion and headache. EXAM: DIAGNOSTIC LUMBAR PUNCTURE UNDER FLUOROSCOPIC GUIDANCE FLUOROSCOPY TIME:  Fluoroscopy Time:  0 minutes, 18 seconds Radiation Exposure Index (if provided by the fluoroscopic device): 2.6 mGy Number of Acquired Spot Images: 0 PROCEDURE: I discussed the risks (including hemorrhage, infection, headache, and nerve damage, among others), benefits, and alternatives to fluoroscopically guided lumbar puncture with the patient. We specifically discussed the high technical likelihood of success of the procedure. The patient understood and elected to undergo the procedure. Standard time-out was employed. Following sterile skin prep and local anesthetic administration consisting of 1 percent lidocaine, a 20 gauge spinal needle was advanced without difficulty into the thecal sac at the at the L3-4 level under fluoroscopic guidance. Clear CSF was returned. The patient did not want to turn on her side to obtain the opening pressure, and grew tearfull. Accordingly, opening pressure was obtained with the patient prone. Initially the opening pressure was about 35 cm of water, but this appeared to reduce as the patient calmed down, and ended up at about 25 cm of water. 12 cc of clear CSF was collected. The needle was subsequently removed and the skin cleansed and bandaged. No immediate complications were observed. IMPRESSION: 1.  Technically successful  fluoroscopically guided lumbar puncture at the L3-4 level yielding 12 cc of clear CSF. Initially the opening pressure seems significantly elevated at about 35 cm of water, although the patient was prone and somewhat tearful/agitated. Once the patient calmed down the CSF pressure reduced to 25 cm of water. Electronically Signed   By: Van Clines M.D.   On: 09/04/2018 11:50    ASSESSMENT & PLAN:   HUS (hemolytic uremic syndrome), atypical ([place]) # Atypical hemolytic uremic syndrome -on Ultimoris infusions. Hb ~12  /improving.   # ESRD- on HD [Tues/Thur/sat]- [facility]-; plannnig to start PD next week .  #Hypertension stable well-controlled.  #Episode of headaches mental status changes-is post MRI/LP negative for any meningitis.  Patient will likely need a booster meningococcal infection.  Will follow.  # DISPOSITION: # Treatment today # follow up in 8 weeks- MD-  MD/labs-cbc.cmp/ldh; Ultomiris-Dr.B  All questions were answered. The patient knows to call the clinic with any problems, questions or concerns.    Cammie Sickle, MD 09/26/2018 12:56 PM

## 2018-09-26 NOTE — Assessment & Plan Note (Addendum)
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 Hb ~12  /improving.   # ESRD- on HD [Tues/Thur/sat]- [facility]-; plannnig to start PD next week .  #Hypertension stable well-controlled.  #Episode of headaches mental status changes-is post MRI/LP negative for any meningitis.  Patient will likely need a booster meningococcal infection.  Will follow.  # DISPOSITION: # Treatment today # follow up in 8 weeks- MD-  MD/labs-cbc.cmp/ldh; Ultomiris-Dr.B

## 2018-09-28 ENCOUNTER — Inpatient Hospital Stay: Payer: Medicaid Other

## 2018-09-28 ENCOUNTER — Inpatient Hospital Stay: Payer: Medicaid Other | Admitting: Internal Medicine

## 2018-11-07 ENCOUNTER — Inpatient Hospital Stay: Payer: Medicare Other

## 2018-11-07 ENCOUNTER — Inpatient Hospital Stay: Payer: Medicare Other | Admitting: Internal Medicine

## 2018-11-11 ENCOUNTER — Inpatient Hospital Stay: Payer: Medicare Other

## 2018-11-11 ENCOUNTER — Inpatient Hospital Stay: Payer: Medicare Other | Admitting: Internal Medicine

## 2018-11-21 ENCOUNTER — Inpatient Hospital Stay (HOSPITAL_BASED_OUTPATIENT_CLINIC_OR_DEPARTMENT_OTHER): Payer: Medicare Other | Admitting: Internal Medicine

## 2018-11-21 ENCOUNTER — Inpatient Hospital Stay: Payer: Medicare Other | Attending: Internal Medicine

## 2018-11-21 ENCOUNTER — Inpatient Hospital Stay: Payer: Medicare Other

## 2018-11-21 ENCOUNTER — Other Ambulatory Visit: Payer: Self-pay

## 2018-11-21 DIAGNOSIS — I120 Hypertensive chronic kidney disease with stage 5 chronic kidney disease or end stage renal disease: Secondary | ICD-10-CM

## 2018-11-21 DIAGNOSIS — D5939 Other hemolytic-uremic syndrome: Secondary | ICD-10-CM

## 2018-11-21 DIAGNOSIS — E1122 Type 2 diabetes mellitus with diabetic chronic kidney disease: Secondary | ICD-10-CM | POA: Insufficient documentation

## 2018-11-21 DIAGNOSIS — D593 Hemolytic-uremic syndrome: Secondary | ICD-10-CM

## 2018-11-21 DIAGNOSIS — Z79899 Other long term (current) drug therapy: Secondary | ICD-10-CM | POA: Diagnosis not present

## 2018-11-21 DIAGNOSIS — N186 End stage renal disease: Secondary | ICD-10-CM | POA: Diagnosis not present

## 2018-11-21 DIAGNOSIS — Z794 Long term (current) use of insulin: Secondary | ICD-10-CM | POA: Diagnosis not present

## 2018-11-21 LAB — CBC WITH DIFFERENTIAL/PLATELET
Abs Immature Granulocytes: 0.02 10*3/uL (ref 0.00–0.07)
Basophils Absolute: 0.1 10*3/uL (ref 0.0–0.1)
Basophils Relative: 1 %
Eosinophils Absolute: 0.2 10*3/uL (ref 0.0–0.5)
Eosinophils Relative: 3 %
HCT: 29.1 % — ABNORMAL LOW (ref 36.0–46.0)
Hemoglobin: 9.6 g/dL — ABNORMAL LOW (ref 12.0–15.0)
Immature Granulocytes: 0 %
Lymphocytes Relative: 19 %
Lymphs Abs: 1.1 10*3/uL (ref 0.7–4.0)
MCH: 27 pg (ref 26.0–34.0)
MCHC: 33 g/dL (ref 30.0–36.0)
MCV: 82 fL (ref 80.0–100.0)
Monocytes Absolute: 0.4 10*3/uL (ref 0.1–1.0)
Monocytes Relative: 8 %
Neutro Abs: 3.9 10*3/uL (ref 1.7–7.7)
Neutrophils Relative %: 69 %
Platelets: 290 10*3/uL (ref 150–400)
RBC: 3.55 MIL/uL — ABNORMAL LOW (ref 3.87–5.11)
RDW: 15 % (ref 11.5–15.5)
WBC: 5.7 10*3/uL (ref 4.0–10.5)
nRBC: 0 % (ref 0.0–0.2)

## 2018-11-21 LAB — COMPREHENSIVE METABOLIC PANEL
ALT: 13 U/L (ref 0–44)
AST: 14 U/L — ABNORMAL LOW (ref 15–41)
Albumin: 3.3 g/dL — ABNORMAL LOW (ref 3.5–5.0)
Alkaline Phosphatase: 99 U/L (ref 38–126)
Anion gap: 15 (ref 5–15)
BUN: 82 mg/dL — ABNORMAL HIGH (ref 6–20)
CO2: 24 mmol/L (ref 22–32)
Calcium: 8.3 mg/dL — ABNORMAL LOW (ref 8.9–10.3)
Chloride: 96 mmol/L — ABNORMAL LOW (ref 98–111)
Creatinine, Ser: 15.75 mg/dL — ABNORMAL HIGH (ref 0.44–1.00)
GFR calc Af Amer: 3 mL/min — ABNORMAL LOW (ref >60)
GFR calc non Af Amer: 3 mL/min — ABNORMAL LOW (ref >60)
Glucose, Bld: 147 mg/dL — ABNORMAL HIGH (ref 70–99)
Potassium: 4.8 mmol/L (ref 3.5–5.1)
Sodium: 135 mmol/L (ref 135–145)
Total Bilirubin: 0.6 mg/dL (ref 0.3–1.2)
Total Protein: 7 g/dL (ref 6.5–8.1)

## 2018-11-21 LAB — LACTATE DEHYDROGENASE: LDH: 153 U/L (ref 98–192)

## 2018-11-21 MED ORDER — SODIUM CHLORIDE 0.9 % IV SOLN
3300.0000 mg | INTRAVENOUS | Status: DC
  Administered 2018-11-21: 3300 mg via INTRAVENOUS
  Filled 2018-11-21: qty 330

## 2018-11-21 MED ORDER — SODIUM CHLORIDE 0.9 % IV SOLN
Freq: Once | INTRAVENOUS | Status: AC
  Administered 2018-11-21: 10:00:00 via INTRAVENOUS
  Filled 2018-11-21: qty 250

## 2018-11-21 NOTE — Progress Notes (Signed)
Patient does not offer any problems today.  

## 2018-11-21 NOTE — Assessment & Plan Note (Addendum)
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 Hb ~9.  /Slightly worsened.  LDH- normal. Discussed re: Epo injections; will order iron studies/ferritin/haptoglobin at next visit.  If low will restart erythropoietin injections.  Patient received IV iron in June with nephrology.  # ESRD- on PD; stable.   # Low calcium- 8.3- recommend ca+vit D BID.   #Hypertension-improved/ stable.   # DISPOSITION: # Treatment today # follow up in 8 weeks- MD- labs-cbc.cmp/ldh; haptoglobin; iron studies/ferritin- Ultomiris-Dr.B

## 2018-11-21 NOTE — Progress Notes (Signed)
[facility] NOTE  Patient Care Team: Rubie Maid, MD as PCP - General (Obstetrics and Gynecology) [name], FNP as Consulting Physician (Family Medicine) Azzie Glatter, MD as Consulting Physician (Internal Medicine) Sherryll Burger, MD as Consulting Physician (Nephrology)  CHIEF COMPLAINTS/PURPOSE OF CONSULTATION: Atypical HUS  # ATYPICAL HEMOLYTIC UREMIC SYNDROME [April 2019-Dx; [facility]; Dr.Arepally;s/p Plex x1; April 26th- Ecluzimab 1200 mg q 2W; march 25th 2020- Ultomiris q  8 weeks.   # AKI/CKD [name] 2018;FEB 2020- hemodialysis [[facility] in [place]; Tuesday Thursday Saturday]; May 2020-peritoneal dialysis  # IDDM [since age of 66- Dr.Kerr,/Endo]  # WORK UP for HUS [[facility]] Phospholipase A2 Receptor AB/ S Phospholipase A2 Receptor IFA-NEGATIVE   Oncology History   No history exists.     HISTORY OF PRESENTING ILLNESS:  Kelly Huff 33 y.o.  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  In the interim patient has been transitioned to peritoneal dialysis.  She had a hemodialysis catheter taken out.  She notes her improvement of her quality of life.  She denies any headache.  Denies any repeat episodes of mental status changes or admission to hospital.  No nausea no vomiting no diarrhea.  Review of Systems  Constitutional: Positive for malaise/fatigue. Negative for chills, diaphoresis, fever and weight loss.  HENT: Negative for nosebleeds and sore throat.   Eyes: Negative for double vision.  Respiratory: Negative for cough, hemoptysis, sputum production, shortness of breath and wheezing.   Cardiovascular: Negative for chest pain, palpitations, orthopnea and leg swelling.  Gastrointestinal: Negative for abdominal pain, blood in stool, constipation, diarrhea, heartburn, melena, nausea and vomiting.  Genitourinary: Negative for dysuria, frequency and urgency.  Musculoskeletal: Negative  for back pain and joint pain.  Skin: Negative.  Negative for itching and rash.  Neurological: Negative for dizziness, tingling, focal weakness, weakness and headaches.  Endo/Heme/Allergies: Does not bruise/bleed easily.  Psychiatric/Behavioral: Negative for depression. The patient is not nervous/anxious and does not have insomnia.     MEDICAL HISTORY:  Past Medical History:  Diagnosis Date  . Chronic kidney disease   . Diabetes mellitus without complication ([place])   . History of pre-eclampsia 2016   mild  . Hypertension     SURGICAL HISTORY: Past Surgical History:  Procedure Laterality Date  . CESAREAN SECTION    . DILATION AND CURETTAGE OF UTERUS     x 4    SOCIAL HISTORY: Social History   Socioeconomic History  . Marital status: Married    Spouse name: Not on file  . Number of children: Not on file  . Years of education: Not on file  . Highest education level: Not on file  Occupational History  . Not on file  Social Needs  . Financial resource strain: Not on file  . Food insecurity    Worry: Not on file    Inability: Not on file  . Transportation needs    Medical: Not on file    Non-medical: Not on file  Tobacco Use  . Smoking status: Never Smoker  . Smokeless tobacco: Never Used  Substance and Sexual Activity  . Alcohol use: No  . Drug use: No  . Sexual activity: Yes    Birth control/protection: None, Surgical    Comment: tubial  Lifestyle  . Physical activity    Days per week: Not on file    Minutes per session: Not on file  . Stress: Not on file  Relationships  . Social  connections    Talks on phone: Not on file    Gets together: Not on file    Attends religious service: Not on file    Active member of club or organization: Not on file    Attends meetings of clubs or organizations: Not on file    Relationship status: Not on file  . Intimate partner violence    Fear of current or ex partner: Not on file    Emotionally abused: Not on file     Physically abused: Not on file    Forced sexual activity: Not on file  Other Topics Concern  . Not on file  Social History Narrative  . Not on file    FAMILY HISTORY: Family History  Problem Relation Age of Onset  . Hypertension Mother   . Cancer Mother        Uterine vs cervical (pt unsure),   . Schizophrenia Brother   . Breast cancer Neg Hx     ALLERGIES:  is allergic to morphine; sulfamethoxazole-trimethoprim; and trulicity [dulaglutide].  MEDICATIONS:  Current Outpatient Medications  Medication Sig Dispense Refill  . amLODipine (NORVASC) 10 MG tablet     . B Complex-C-Folic Acid (RENO CAPS) 1 MG CAPS Take 1 mg by mouth daily.    . calcium acetate (PHOSLO) 667 MG capsule Take 667 mg by mouth 3 (three) times daily.     . clobetasol ointment (TEMOVATE) 9.98 % Apply 1 application topically 2 (two) times daily as needed for itching.    . cloNIDine (CATAPRES - DOSED IN MG/24 HR) 0.1 mg/24hr patch APPLY 1 PATCH TO SKIN ONCE A WEEK APPLY TO SKIN ONCE A WEEK    . cyclobenzaprine (FLEXERIL) 10 MG tablet Take 1 tablet by mouth as needed for muscle pain.    Marland Kitchen gentamicin cream (GARAMYCIN) 0.1 % APPLY SMALL AMOUNT TO CATHETER EXIT SITE ONCE DAILY    . HUMALOG 100 UNIT/ML injection     . Insulin Human (INSULIN PUMP) SOLN Inject into the skin continuous. Novolog    . metoprolol succinate (TOPROL-XL) 100 MG 24 hr tablet Take 100 mg by mouth 2 (two) times daily.    Marland Kitchen olmesartan (BENICAR) 40 MG tablet Take 40 mg by mouth every morning.    . ondansetron (ZOFRAN ODT) 4 MG disintegrating tablet Take 1 tablet (4 mg total) by mouth every 8 (eight) hours as needed for nausea or vomiting. 20 tablet 0  . paricalcitol (ZEMPLAR) 1 MCG capsule Take 1 mcg by mouth daily.    . Insulin Aspart (NOVOLOG [place]) Inject 0-20 Units into the skin See admin instructions. Uses with Insulin Pump    . metoprolol succinate (TOPROL-XL) 50 MG 24 hr tablet Take 50 mg by mouth daily.    . metoprolol succinate (TOPROL-XL) 50  MG 24 hr tablet Take 1 tablet by mouth 2 (two) times a day.    Marland Kitchen NIFEdipine (PROCARDIA XL/NIFEDICAL XL) 60 MG 24 hr tablet Take 60 mg by mouth 2 (two) times a day.     No current facility-administered medications for this visit.       Marland Kitchen  PHYSICAL EXAMINATION: ECOG PERFORMANCE STATUS: 1 - Symptomatic but completely ambulatory  Vitals:   11/21/18 0919  BP: 119/81  Pulse: 76  Resp: 16  Temp: (!) 97.3 F (36.3 C)   Filed Weights   11/21/18 0919  Weight: 181 lb 9.6 oz (82.4 kg)    Physical Exam  Constitutional: She is oriented to person, place, and time and well-developed, well-nourished,  and in no distress.  Patient is alone.  HENT:  Head: Normocephalic and atraumatic.  Mouth/Throat: Oropharynx is clear and moist. No oropharyngeal exudate.  Eyes: Pupils are equal, round, and reactive to light.  Neck: Normal range of motion. Neck supple.  Cardiovascular: Normal rate and regular rhythm.  Pulmonary/Chest: No respiratory distress. She has no wheezes.  Abdominal: Soft. Bowel sounds are normal. She exhibits no distension and no mass. There is no abdominal tenderness. There is no rebound and no guarding.  Musculoskeletal: Normal range of motion.        General: No tenderness or edema.  Neurological: She is alert and oriented to person, place, and time.  Skin: Skin is warm.  Psychiatric: Affect normal.  ;  LABORATORY DATA:  I have reviewed the data as listed Lab Results  Component Value Date   WBC 5.7 11/21/2018   HGB 9.6 (L) 11/21/2018   HCT 29.1 (L) 11/21/2018   MCV 82.0 11/21/2018   PLT 290 11/21/2018   Recent Labs    09/03/18 0447 09/03/18 1042 09/04/18 0426 09/26/18 0947 11/21/18 0906  NA 130* 133* 136 135 135  K 4.7 4.4 4.3 5.1 4.8  CL 90* 91* 96* 98 96*  CO2 19* 21* 27 24 24   GLUCOSE 381* 359* 215* 200* 147*  BUN 33* 37* 18 39* 82*  CREATININE 8.87* 9.27* 6.56* 9.34* 15.75*  CALCIUM 9.8 10.0 9.1 8.9 8.3*  GFRNONAA 5* 5* 8* 5* 3*  GFRAA 6* 6* 9* 6* 3*   PROT 7.2  --   --  7.6 7.0  ALBUMIN 3.6 3.6  --  3.6 3.3*  AST 16  --   --  15 14*  ALT 13  --   --  10 13  ALKPHOS 130*  --   --  121 99  BILITOT 0.5  --   --  0.3 0.6  BILIDIR <0.1  --   --   --   --   IBILI NOT CALCULATED  --   --   --   --     RADIOGRAPHIC STUDIES: I have personally reviewed the radiological images as listed and agreed with the findings in the report. No results found.  ASSESSMENT & PLAN:   HUS (hemolytic uremic syndrome), atypical ([place]) # Atypical hemolytic uremic syndrome -on Ultimoris infusions. Hb ~9.  /Slightly worsened.  LDH- normal. Discussed re: Epo injections; will order iron studies/ferritin/haptoglobin at next visit.  If low will restart erythropoietin injections.  Patient received IV iron in June with nephrology.  # ESRD- on PD; stable.   # Low calcium- 8.3- recommend ca+vit D BID.   #Hypertension-improved/ stable.   # DISPOSITION: # Treatment today # follow up in 8 weeks- MD- labs-cbc.cmp/ldh; haptoglobin; iron studies/ferritin- Ultomiris-Dr.B  All questions were answered. The patient knows to call the clinic with any problems, questions or concerns.    Cammie Sickle, MD 11/21/2018 9:42 AM

## 2018-11-24 ENCOUNTER — Ambulatory Visit
Admission: RE | Admit: 2018-11-24 | Discharge: 2018-11-24 | Disposition: A | Discharge status: Home / Self Care | Payer: Medicare Other | Source: Ambulatory Visit | Attending: Nephrology | Admitting: Nephrology

## 2018-11-24 ENCOUNTER — Other Ambulatory Visit: Payer: Self-pay | Admitting: Nephrology

## 2018-11-24 DIAGNOSIS — T85611D Breakdown (mechanical) of intraperitoneal dialysis catheter, subsequent encounter: Secondary | ICD-10-CM

## 2018-11-25 ENCOUNTER — Emergency Department (HOSPITAL_COMMUNITY): Payer: Medicare Other

## 2018-11-25 ENCOUNTER — Encounter (HOSPITAL_COMMUNITY): Payer: Self-pay | Admitting: Emergency Medicine

## 2018-11-25 ENCOUNTER — Emergency Department (HOSPITAL_COMMUNITY)
Admission: EM | Admit: 2018-11-25 | Discharge: 2018-11-25 | Disposition: A | Discharge status: Home / Self Care | Payer: Medicare Other | Attending: Emergency Medicine | Admitting: Emergency Medicine

## 2018-11-25 DIAGNOSIS — I120 Hypertensive chronic kidney disease with stage 5 chronic kidney disease or end stage renal disease: Secondary | ICD-10-CM | POA: Diagnosis not present

## 2018-11-25 DIAGNOSIS — N186 End stage renal disease: Secondary | ICD-10-CM | POA: Insufficient documentation

## 2018-11-25 DIAGNOSIS — K5900 Constipation, unspecified: Secondary | ICD-10-CM | POA: Diagnosis present

## 2018-11-25 DIAGNOSIS — Z79899 Other long term (current) drug therapy: Secondary | ICD-10-CM | POA: Diagnosis not present

## 2018-11-25 DIAGNOSIS — E1122 Type 2 diabetes mellitus with diabetic chronic kidney disease: Secondary | ICD-10-CM | POA: Insufficient documentation

## 2018-11-25 DIAGNOSIS — Z794 Long term (current) use of insulin: Secondary | ICD-10-CM | POA: Insufficient documentation

## 2018-11-25 LAB — COMPREHENSIVE METABOLIC PANEL
ALT: 10 U/L (ref 0–44)
AST: 14 U/L — ABNORMAL LOW (ref 15–41)
Albumin: 3 g/dL — ABNORMAL LOW (ref 3.5–5.0)
Alkaline Phosphatase: 101 U/L (ref 38–126)
Anion gap: 14 (ref 5–15)
BUN: 80 mg/dL — ABNORMAL HIGH (ref 6–20)
CO2: 20 mmol/L — ABNORMAL LOW (ref 22–32)
Calcium: 7.6 mg/dL — ABNORMAL LOW (ref 8.9–10.3)
Chloride: 103 mmol/L (ref 98–111)
Creatinine, Ser: 17.46 mg/dL — ABNORMAL HIGH (ref 0.44–1.00)
GFR calc Af Amer: 3 mL/min — ABNORMAL LOW (ref >60)
GFR calc non Af Amer: 2 mL/min — ABNORMAL LOW (ref >60)
Glucose, Bld: 123 mg/dL — ABNORMAL HIGH (ref 70–99)
Potassium: 5.2 mmol/L — ABNORMAL HIGH (ref 3.5–5.1)
Sodium: 137 mmol/L (ref 135–145)
Total Bilirubin: 0.4 mg/dL (ref 0.3–1.2)
Total Protein: 6.3 g/dL — ABNORMAL LOW (ref 6.5–8.1)

## 2018-11-25 LAB — CBC
HCT: 29.6 % — ABNORMAL LOW (ref 36.0–46.0)
Hemoglobin: 9.6 g/dL — ABNORMAL LOW (ref 12.0–15.0)
MCH: 27.4 pg (ref 26.0–34.0)
MCHC: 32.4 g/dL (ref 30.0–36.0)
MCV: 84.3 fL (ref 80.0–100.0)
Platelets: 295 10*3/uL (ref 150–400)
RBC: 3.51 MIL/uL — ABNORMAL LOW (ref 3.87–5.11)
RDW: 15.4 % (ref 11.5–15.5)
WBC: 5 10*3/uL (ref 4.0–10.5)
nRBC: 0 % (ref 0.0–0.2)

## 2018-11-25 LAB — URINALYSIS, ROUTINE W REFLEX MICROSCOPIC
Bilirubin Urine: NEGATIVE
Glucose, UA: 50 mg/dL — AB
Ketones, ur: NEGATIVE mg/dL
Leukocytes,Ua: NEGATIVE
Nitrite: NEGATIVE
Protein, ur: >=300 mg/dL — AB
Specific Gravity, Urine: 1.01 (ref 1.005–1.030)
pH: 7 (ref 5.0–8.0)

## 2018-11-25 LAB — I-STAT BETA HCG BLOOD, ED (MC, WL, AP ONLY): I-stat hCG, quantitative: <5 m[IU]/mL (ref <5)

## 2018-11-25 LAB — LIPASE, BLOOD: Lipase: 32 U/L (ref 11–51)

## 2018-11-25 MED ORDER — SODIUM CHLORIDE 0.9% FLUSH: 3.0000 mL | Freq: Once | INTRAVENOUS | Status: DC

## 2018-11-25 NOTE — Discharge Instructions (Addendum)
You should resume your stool softener only if you feel constipated.  Otherwise continue with your usual peritoneal dialysis and follow up closely with your nephrologist for further care.  Return if you have any concerns

## 2018-11-25 NOTE — ED Triage Notes (Signed)
Pt states she has had ineffective PD dialysis since Monday due to constipation pt state she was told by MD foster to come to ED for constipation as well as labs due to no dialysis.

## 2018-11-25 NOTE — ED Provider Notes (Signed)
[facility] EMERGENCY DEPARTMENT Provider Note   CSN: [id number] Arrival date & time: 11/25/18  8101     History   Chief Complaint Chief Complaint  Patient presents with   Constipation    HPI  Kelly Huff is a 33 y.o. female.     The history is provided by the patient and medical records. No language interpreter was used.  Constipation    33 year old female with history of end-stage renal disease currently on dialysis, history of diabetes, hypertension, presenting for evaluation of constipation.  Patient states she normally does peritoneal dialysis on a nightly basis.  She mention within the past for 5days peritoneal dialysis result has been "ineffective".  She states she normally put in 2000 mL of fluid and normally withdraw a little bit more than 2000 mL when it is functioning appropriately however she noticed that her output has been roughly half of that within the past few days.  She initially noted that she was feeling constipated and was taking several different types of stool softener.  She is now having loose stools for the past few days and was unsure if that is the results of her decreased PD output.  She endorsed only occasional abdominal cramping that is mild.  She does not complain of any fever chills no chest pain shortness of breath lightheadedness or dizziness no nausea vomiting no dysuria.  She was concerned about the low output of her PD and did discuss with her nephrologist who recommend patient to come to ER for further ration.  She mention she was seen yesterday had an x-ray of her abdomen and was told that she is constipated.  Past Medical History:  Diagnosis Date   Chronic kidney disease    Diabetes mellitus without complication ([place])    History of pre-eclampsia 2016   mild   Hypertension     Patient Active Problem List   Diagnosis Date Noted   Acute encephalopathy 09/03/2018   Hypertensive urgency 09/03/2018   Enteritis  07/27/2018   ESRD on dialysis ([place]) 07/27/2018   Hypertension associated with diabetes ([place]) 07/27/2018   Uremic syndrome 07/18/2018   HUS (hemolytic uremic syndrome), atypical ([place]) 01/03/2018   Elevated troponin 08/14/2017   CKD (chronic kidney disease) stage 3, GFR 30-59 ml/min ([facility]) 08/14/2017   Chronic hypertension affecting pregnancy 08/13/2017   Labor and delivery, indication for care 08/12/2017   Acquired hemolytic anemia ([place]) 08/04/2017   Iron deficiency anemia during pregnancy 07/06/2017   History of pre-eclampsia in prior pregnancy, currently pregnant in first trimester 06/12/2017   Diabetes ([place]) 04/13/2017   Acute renal failure ([place])    Dehydration    Other nonspecific abnormal finding 01/29/2016   Family history of BRCA gene positive 07/13/2013   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 Authorizing Provider  amLODipine (NORVASC) 10 MG tablet  11/06/18   [provider]  B Complex-C-Folic Acid (RENO CAPS) 1 MG CAPS Take 1 mg by mouth daily. 08/02/18   [provider]  calcium acetate (PHOSLO) 667 MG capsule Take 667 mg by mouth 3 (three) times daily.  11/26/17 11/26/18  [provider]  clobetasol ointment (TEMOVATE) 0.22 % Apply 1 application topically 2 (two) times daily as needed for itching. 03/07/18   [provider]  cloNIDine (CATAPRES - DOSED IN MG/24 HR) 0.1 mg/24hr patch APPLY 1 PATCH TO SKIN ONCE A WEEK APPLY TO SKIN ONCE A WEEK 10/10/18   [provider]    cyclobenzaprine (FLEXERIL) 10 MG tablet Take 1 tablet by mouth as needed for muscle pain.    [provider]  gentamicin cream (GARAMYCIN) 0.1 % APPLY SMALL AMOUNT TO CATHETER EXIT SITE ONCE DAILY 09/07/18   [provider]  HUMALOG 100 UNIT/ML injection  11/16/18   [provider]  Insulin Aspart (NOVOLOG [place]) Inject 0-20 Units into the skin See admin instructions. Uses with Insulin Pump    [provider]  Insulin Human (INSULIN PUMP) SOLN Inject into the skin continuous. Novolog    [provider]  metoprolol succinate (TOPROL-XL) 100 MG 24 hr tablet Take 100 mg by mouth 2 (two) times daily. 10/18/18   [provider]  metoprolol succinate (TOPROL-XL) 50 MG 24 hr tablet Take 50 mg by mouth daily. 08/23/18   [provider]  metoprolol succinate (TOPROL-XL) 50 MG 24 hr tablet Take 1 tablet by mouth 2 (two) times a day. 08/23/18   [provider]  NIFEdipine (PROCARDIA XL/NIFEDICAL XL) 60 MG 24 hr tablet Take 60 mg by mouth 2 (two) times a day.    [provider]  olmesartan (BENICAR) 40 MG tablet Take 40 mg by mouth every morning. 10/13/18   [provider]  ondansetron (ZOFRAN ODT) 4 MG disintegrating tablet Take 1 tablet (4 mg total) by mouth every 8 (eight) hours as needed for nausea or vomiting. 07/29/18   Geradine Girt, DO  paricalcitol (ZEMPLAR) 1 MCG capsule Take 1 mcg by mouth daily.    [provider]    Family History Family History  Problem Relation Age of Onset   Hypertension Mother    Cancer Mother        Uterine vs cervical (pt unsure),    Schizophrenia Brother    Breast cancer Neg Hx     Social History Social History   Tobacco Use   Smoking status: Never Smoker   Smokeless tobacco: Never Used  Substance Use Topics   Alcohol use: No   Drug use: No     Allergies   Morphine, Sulfamethoxazole-trimethoprim, and Trulicity [dulaglutide]   Review of Systems Review of  Systems  Gastrointestinal: Positive for constipation.  All other systems reviewed and are negative.    Physical Exam Updated Vital Signs BP (!) 160/97 (BP Location: Right Arm)    Pulse 84    Temp 98.2 F (36.8 C) (Oral)    Resp 16    SpO2 100%   Physical Exam Vitals signs and nursing note reviewed.  Constitutional:      General: She is not in acute distress.    Appearance: She is well-developed.  HENT:     Head: Atraumatic.  Eyes:     Conjunctiva/sclera: Conjunctivae normal.  Neck:  Musculoskeletal: Neck supple.  Cardiovascular:     Rate and Rhythm: Normal rate and regular rhythm.     Pulses: Normal pulses.     Heart sounds: Normal heart sounds.  Pulmonary:     Effort: Pulmonary effort is normal.     Breath sounds: Normal breath sounds.  Abdominal:     General: Abdomen is flat. Bowel sounds are normal.     Tenderness: There is no abdominal tenderness. There is no guarding or rebound.     Comments: Abdomen is soft nontender no guarding or rebound tenderness.  Peritoneal site is normal in appearance.  Skin:    Findings: No rash.  Neurological:     Mental Status: She is alert.      ED Treatments / Results  Labs (all labs ordered are listed, but only abnormal results are displayed) Labs Reviewed  COMPREHENSIVE METABOLIC PANEL - Abnormal; Notable for the following components:      Result Value   Potassium 5.2 (*)    CO2 20 (*)    Glucose, Bld 123 (*)    BUN 80 (*)    Creatinine, Ser 17.46 (*)    Calcium 7.6 (*)    Total Protein 6.3 (*)    Albumin 3.0 (*)    AST 14 (*)    GFR calc non Af Amer 2 (*)    GFR calc Af Amer 3 (*)    All other components within normal limits  CBC - Abnormal; Notable for the following components:   RBC 3.51 (*)    Hemoglobin 9.6 (*)    HCT 29.6 (*)    All other components within normal limits  URINALYSIS, ROUTINE W REFLEX MICROSCOPIC - Abnormal; Notable for the following components:   Color, Urine STRAW (*)    Glucose, UA 50 (*)     Hgb urine dipstick MODERATE (*)    Protein, ur >=300 (*)    Bacteria, UA RARE (*)    All other components within normal limits  LIPASE, BLOOD  I-STAT BETA HCG BLOOD, ED ([facility], [facility], [facility] ONLY)    EKG None  Radiology Ct Abdomen Pelvis Wo Contrast  Result Date: 11/25/2018 CLINICAL DATA:  Abdominal pain, constipation, incomplete dialysis EXAM: CT ABDOMEN AND PELVIS WITHOUT CONTRAST TECHNIQUE: Multidetector CT imaging of the abdomen and pelvis was performed following the standard protocol without IV contrast. COMPARISON:  Abdominal radiograph, 11/24/2018, CT abdomen pelvis, 09/02/2018 FINDINGS: Lower chest: No acute abnormality. Hepatobiliary: No solid liver abnormality is seen. No gallstones, gallbladder wall thickening, or biliary dilatation. Pancreas: Unremarkable. No pancreatic ductal dilatation or surrounding inflammatory changes. Spleen: Normal in size without significant abnormality. Adrenals/Urinary Tract: Adrenal glands are unremarkable. Kidneys are normal, without renal calculi, solid lesion, or hydronephrosis. Thickening of the urinary bladder. Stomach/Bowel: Stomach is within normal limits. Appendix appears normal. No evidence of bowel wall thickening, distention, or inflammatory changes. Scattered stool in the colon. Vascular/Lymphatic: No significant vascular findings are present. No enlarged abdominal or pelvic lymph nodes. Reproductive: No mass or other significant abnormality. Other: No abdominal wall hernia or abnormality. No abdominopelvic ascites. Peritoneal dialysis catheter is positioned with tip in the central low abdomen. Musculoskeletal: No acute or significant osseous findings. IMPRESSION: 1.  No large burden of stool in the colon. 2. Peritoneal dialysis catheter is positioned with tip in the central low abdomen. 3. Thickening of the urinary bladder, nonspecific. Correlate with urinalysis. Electronically Signed   By: Eddie Candle M.D.   On: 11/25/2018 13:20   Dg Abd 1  View  Result Date: 11/24/2018 CLINICAL DATA:  Peritoneal dialysis catheter.  Constipation. EXAM: ABDOMEN - 1 VIEW COMPARISON:  CT 09/02/2018 FINDINGS: Interval placement of catheter within the right hemiabdomen with distal tip coiled in left hemipelvis. No evidence of catheter kinking or discontinuity. Mild colonic stool burden. No gross free intraperitoneal air. Osseous structures are intact and unremarkable. IMPRESSION: Mild colonic stool burden. Electronically Signed   By: Davina Poke M.D.   On: 11/24/2018 10:20    Procedures Procedures (including critical care time)  Medications Ordered in ED Medications  sodium chloride flush (NS) 0.9 % injection 3 mL (has no administration in time range)     Initial Impression / Assessment and Plan / ED Course  I have reviewed the triage vital signs and the nursing notes.  Pertinent labs & imaging results that were available during my care of the patient were reviewed by me and considered in my medical decision making (see chart for details).        BP (!) 160/97 (BP Location: Right Arm)    Pulse 84    Temp 98.2 F (36.8 C) (Oral)    Resp 16    SpO2 100%    Final Clinical Impressions(s) / ED Diagnoses   Final diagnoses:  Constipation, unspecified constipation type    ED Discharge Orders    None     11:42 AM Patient report regular peritoneal dialysis output has been lower than usual which concerns her.  At the same time she also reported feeling constipated and has been using stool softener and now having loose stools for the past 3 to 4 days.  On exam she has a soft nontender abdomen with bowel sounds present.  Low suspicion for SBP.  She had an x-ray of her abdomen yesterday that shows mild stool burden.  Will obtain abdominal pelvis CT scan for further evaluation.  Patient otherwise well-appearing.  1:29 PM UA with moderate hemoglobin and urine dipsticks but no evidence of urinary tract infection.  Pregnancy test is negative.   Normal lipase.  Abnormal renal function as suspected in the setting of end-stage renal disease.  Potassium is 5.2.  Abdominal pelvis CT scan without any large burden of stool in the colon.  Mild thickening of the urinary bladder that is nonspecific.  At this time, I encourage pt to f/u with her nephrologist for further care.  Otherwise she is stable for discharge.  Care discussed with Dr. Ronnald Nian.    Domenic Moras, PA-C 11/25/18 [place], [place], DO 11/25/18 1557

## 2018-11-28 ENCOUNTER — Encounter: Payer: Medicaid Other | Admitting: Obstetrics and Gynecology

## 2018-12-03 ENCOUNTER — Encounter: Payer: Self-pay | Admitting: Family Medicine

## 2018-12-03 ENCOUNTER — Inpatient Hospital Stay (HOSPITAL_COMMUNITY)
Admission: AD | Admit: 2018-12-03 | Discharge: 2018-12-06 | DRG: 919 | Disposition: A | Discharge status: Home / Self Care | Payer: Medicare Other | Source: Other Acute Inpatient Hospital | Attending: Internal Medicine | Admitting: Internal Medicine

## 2018-12-03 DIAGNOSIS — T85611A Breakdown (mechanical) of intraperitoneal dialysis catheter, initial encounter: Secondary | ICD-10-CM | POA: Diagnosis present

## 2018-12-03 DIAGNOSIS — Z818 Family history of other mental and behavioral disorders: Secondary | ICD-10-CM

## 2018-12-03 DIAGNOSIS — M898X9 Other specified disorders of bone, unspecified site: Secondary | ICD-10-CM | POA: Diagnosis present

## 2018-12-03 DIAGNOSIS — D631 Anemia in chronic kidney disease: Secondary | ICD-10-CM | POA: Diagnosis present

## 2018-12-03 DIAGNOSIS — E1022 Type 1 diabetes mellitus with diabetic chronic kidney disease: Secondary | ICD-10-CM | POA: Diagnosis present

## 2018-12-03 DIAGNOSIS — Y812 Prosthetic and other implants, materials and accessory general- and plastic-surgery devices associated with adverse incidents: Secondary | ICD-10-CM | POA: Diagnosis present

## 2018-12-03 DIAGNOSIS — I120 Hypertensive chronic kidney disease with stage 5 chronic kidney disease or end stage renal disease: Secondary | ICD-10-CM | POA: Diagnosis present

## 2018-12-03 DIAGNOSIS — Z992 Dependence on renal dialysis: Secondary | ICD-10-CM | POA: Diagnosis not present

## 2018-12-03 DIAGNOSIS — Z8249 Family history of ischemic heart disease and other diseases of the circulatory system: Secondary | ICD-10-CM

## 2018-12-03 DIAGNOSIS — Z9641 Presence of insulin pump (external) (internal): Secondary | ICD-10-CM | POA: Diagnosis not present

## 2018-12-03 DIAGNOSIS — Z882 Allergy status to sulfonamides status: Secondary | ICD-10-CM

## 2018-12-03 DIAGNOSIS — Z91048 Other nonmedicinal substance allergy status: Secondary | ICD-10-CM | POA: Diagnosis not present

## 2018-12-03 DIAGNOSIS — E103219 Type 1 diabetes mellitus with mild nonproliferative diabetic retinopathy with macular edema, unspecified eye: Secondary | ICD-10-CM | POA: Diagnosis present

## 2018-12-03 DIAGNOSIS — Z885 Allergy status to narcotic agent status: Secondary | ICD-10-CM | POA: Diagnosis not present

## 2018-12-03 DIAGNOSIS — Z809 Family history of malignant neoplasm, unspecified: Secondary | ICD-10-CM

## 2018-12-03 DIAGNOSIS — N2581 Secondary hyperparathyroidism of renal origin: Secondary | ICD-10-CM | POA: Diagnosis present

## 2018-12-03 DIAGNOSIS — N186 End stage renal disease: Secondary | ICD-10-CM

## 2018-12-03 LAB — CREATININE, SERUM
Creatinine, Ser: 15.58 mg/dL — ABNORMAL HIGH (ref 0.44–1.00)
GFR calc Af Amer: 3 mL/min — ABNORMAL LOW (ref >60)
GFR calc non Af Amer: 3 mL/min — ABNORMAL LOW (ref >60)

## 2018-12-03 LAB — CBC
HCT: 24.1 % — ABNORMAL LOW (ref 36.0–46.0)
Hemoglobin: 8 g/dL — ABNORMAL LOW (ref 12.0–15.0)
MCH: 27.4 pg (ref 26.0–34.0)
MCHC: 33.2 g/dL (ref 30.0–36.0)
MCV: 82.5 fL (ref 80.0–100.0)
Platelets: 282 10*3/uL (ref 150–400)
RBC: 2.92 MIL/uL — ABNORMAL LOW (ref 3.87–5.11)
RDW: 15.9 % — ABNORMAL HIGH (ref 11.5–15.5)
WBC: 5.2 10*3/uL (ref 4.0–10.5)
nRBC: 0 % (ref 0.0–0.2)

## 2018-12-03 LAB — GLUCOSE, CAPILLARY
Glucose-Capillary: 49 mg/dL — ABNORMAL LOW (ref 70–99)
Glucose-Capillary: 75 mg/dL (ref 70–99)

## 2018-12-03 LAB — HEMOGLOBIN A1C
Hgb A1c MFr Bld: 8.7 % — ABNORMAL HIGH (ref 4.8–5.6)
Mean Plasma Glucose: 202.99 mg/dL

## 2018-12-03 MED ORDER — INSULIN ASPART 100 UNIT/ML ~~LOC~~ SOLN: 0.0000 [IU] | Freq: Three times a day (TID) | SUBCUTANEOUS | Status: DC

## 2018-12-03 MED ORDER — ONDANSETRON HCL 4 MG PO TABS: 4.0000 mg | ORAL_TABLET | Freq: Four times a day (QID) | ORAL | Status: DC | PRN

## 2018-12-03 MED ORDER — INSULIN ASPART 100 UNIT/ML ~~LOC~~ SOLN: 0.0000 [IU] | Freq: Every day | SUBCUTANEOUS | Status: DC

## 2018-12-03 MED ORDER — ONDANSETRON HCL 4 MG/2ML IJ SOLN
4.0000 mg | Freq: Four times a day (QID) | INTRAMUSCULAR | Status: DC | PRN
  Administered 2018-12-05: 4 mg via INTRAVENOUS
  Filled 2018-12-03: qty 2

## 2018-12-03 MED ORDER — HEPARIN SODIUM (PORCINE) 5000 UNIT/ML IJ SOLN
5000.0000 [IU] | Freq: Three times a day (TID) | INTRAMUSCULAR | Status: AC
  Administered 2018-12-03 – 2018-12-04 (×3): 5000 [IU] via SUBCUTANEOUS
  Filled 2018-12-03 (×3): qty 1

## 2018-12-03 NOTE — Progress Notes (Signed)
[facility] Hospitalist Note:  Called by ED at [facility] - patient presents there a 2 days after laparscopic surgery to correct blocked peritoneal dialysis catheter. That night, drained well. Last night, infused 2578mL of fluid, but only drained 426mL. CT shows subcutaneous edema on left around one of the laparoscopic port sites, but no evidence of infection.  Patient accepted to Medsurg bed once COVID testing returns. Nephrology aware of patient - will assess patient to see whether she will need dialysis over weekend. If she can wait, then will need tunnel catheter for hemodialysis.   Truett Mainland, DO 12/03/2018 1:44 PM

## 2018-12-03 NOTE — H&P (Signed)
History and Physical   PIEPER ROLLING [id number] DOB: 1986/02/06 DOA: 12/03/2018  Referring MD/NP/PA: [facility]  PCP: Rubie Maid, MD   Outpatient Specialists:  [facility]  Patient coming from: Home via [place]  Chief Complaint: Dialysis catheter blocked  HPI: Kelly Huff is a 33 y.o. female with medical history significant of end-stage renal disease on peritoneal dialysis, diabetes, hypertension who was sent over from [facility] secondary to complications of her peritoneal dialysis catheter.  Patient noted irritation and swelling around the catheter site just underneath her skin.  It was also not draining.  Fluids was pooling more on the left side of the abdomen.  She was seen in the ER and nephrology consulted.  Patient was sent over to evaluate heart catheter.  No pain.  No nausea vomiting or diarrhea.  No fever or chills and no contact with anybody with COVID-19..  ED Course: Temperature is 97.9 blood pressure 159/91 pulse 90 respirate of 18 oxygen sat 100% room air.  White count 5.2 hemoglobin 8.0 platelets 282.  Creatinine is 15.58.  Review of Systems: As per HPI otherwise 10 point review of systems negative.    Past Medical History:  Diagnosis Date  . Chronic kidney disease   . Diabetes mellitus without complication ([place])   . History of pre-eclampsia 2016   mild  . Hypertension     Past Surgical History:  Procedure Laterality Date  . CESAREAN SECTION    . DILATION AND CURETTAGE OF UTERUS     x 4     reports that she has never smoked. She has never used smokeless tobacco. She reports that she does not drink alcohol or use drugs.  Allergies  Allergen Reactions  . Morphine Itching and Other (See Comments)    Hallucination  . Sulfamethoxazole-Trimethoprim Hives  . Trulicity [Dulaglutide] Other (See Comments)    She is DM Type 1. Was prescribed but damaged her kidneys    Family History  Problem Relation Age of Onset  .  Hypertension Mother   . Cancer Mother        Uterine vs cervical (pt unsure),   . Schizophrenia Brother   . Breast cancer Neg Hx      Prior to Admission medications   Medication Sig Start Date End Date Taking? Authorizing Provider  amLODipine (NORVASC) 10 MG tablet  11/06/18   [provider]  B Complex-C-Folic Acid (RENO CAPS) 1 MG CAPS Take 1 mg by mouth daily. 08/02/18   [provider]  clobetasol ointment (TEMOVATE) [id number] % Apply 1 application topically 2 (two) times daily as needed for itching. 03/07/18   [provider]  cloNIDine (CATAPRES - DOSED IN MG/24 HR) 0.1 mg/24hr patch APPLY 1 PATCH TO SKIN ONCE A WEEK APPLY TO SKIN ONCE A WEEK 10/10/18   [provider]  cyclobenzaprine (FLEXERIL) 10 MG tablet Take 1 tablet by mouth as needed for muscle pain.    [provider]  gentamicin cream (GARAMYCIN) 0.1 % APPLY SMALL AMOUNT TO CATHETER EXIT SITE ONCE DAILY 09/07/18   [provider]  HUMALOG 100 UNIT/ML injection  11/16/18   [provider]  Insulin Aspart (NOVOLOG [place]) Inject 0-20 Units into the skin See admin instructions. Uses with Insulin Pump    [provider]  Insulin Human (INSULIN PUMP) SOLN Inject into the skin continuous. Novolog    [provider]  metoprolol succinate (TOPROL-XL) 100 MG 24 hr tablet Take 100 mg by  mouth 2 (two) times daily. 10/18/18   [provider]  metoprolol succinate (TOPROL-XL) 50 MG 24 hr tablet Take 50 mg by mouth daily. 08/23/18   [provider]  metoprolol succinate (TOPROL-XL) 50 MG 24 hr tablet Take 1 tablet by mouth 2 (two) times a day. 08/23/18   [provider]  NIFEdipine (PROCARDIA XL/NIFEDICAL XL) 60 MG 24 hr tablet Take 60 mg by mouth 2 (two) times a day.    [provider]  olmesartan (BENICAR) 40 MG tablet Take 40 mg by mouth every morning. 10/13/18   [provider]  ondansetron (ZOFRAN ODT) 4 MG disintegrating tablet  Take 1 tablet (4 mg total) by mouth every 8 (eight) hours as needed for nausea or vomiting. 07/29/18   Geradine Girt, DO  paricalcitol (ZEMPLAR) 1 MCG capsule Take 1 mcg by mouth daily.    [provider]    Physical Exam: Vitals:   12/03/18 2041  BP: (!) 159/91  Pulse: 90  Resp: 18  Temp: 97.9 F (36.6 C)  TempSrc: Oral  SpO2: 100%      Constitutional: NAD, calm, anxious Vitals:   12/03/18 2041  BP: (!) 159/91  Pulse: 90  Resp: 18  Temp: 97.9 F (36.6 C)  TempSrc: Oral  SpO2: 100%   Eyes: PERRL, lids and conjunctivae normal ENMT: Mucous membranes are moist. Posterior pharynx clear of any exudate or lesions.Normal dentition.  Neck: normal, supple, no masses, no thyromegaly Respiratory: clear to auscultation bilaterally, no wheezing, no crackles. Normal respiratory effort. No accessory muscle use.  Cardiovascular: Sinus tachycardia no murmurs / rubs / gallops. No extremity edema. 2+ pedal pulses. No carotid bruits.  Abdomen: Mild tenderness around PD catheter, slightly swollen at the exit point, no obvious discharge, no masses palpated. No hepatosplenomegaly. Bowel sounds positive.  Musculoskeletal: no clubbing / cyanosis. No joint deformity upper and lower extremities. Good ROM, no contractures. Normal muscle tone.  Skin: no rashes, lesions, ulcers. No induration Neurologic: CN 2-12 grossly intact. Sensation intact, DTR normal. Strength 5/5 in all 4.  Psychiatric: Normal judgment and insight. Alert and oriented x 3. Normal mood.     Labs on Admission: I have personally reviewed following labs and imaging studies  CBC: Recent Labs  Lab 12/03/18 2235  WBC 5.2  HGB 8.0*  HCT 24.1*  MCV 82.5  PLT Q000111Q   Basic Metabolic Panel: Recent Labs  Lab 12/03/18 2235  CREATININE 15.58*   GFR: Estimated Creatinine Clearance: 5.6 mL/min (A) (by C-G formula based on SCr of 15.58 mg/dL (H)). Liver Function Tests: No results for input(s): AST, ALT, ALKPHOS, BILITOT,  PROT, ALBUMIN in the last 168 hours. No results for input(s): LIPASE, AMYLASE in the last 168 hours. No results for input(s): AMMONIA in the last 168 hours. Coagulation Profile: No results for input(s): INR, PROTIME in the last 168 hours. Cardiac Enzymes: No results for input(s): CKTOTAL, CKMB, CKMBINDEX, TROPONINI in the last 168 hours. BNP (last 3 results) No results for input(s): PROBNP in the last 8760 hours. HbA1C: Recent Labs    12/03/18 2235  HGBA1C 8.7*   CBG: Recent Labs  Lab 12/03/18 2042 12/03/18 2134 12/04/18 0246 12/04/18 0310  GLUCAP 49* 75 53* 81   Lipid Profile: No results for input(s): CHOL, HDL, LDLCALC, TRIG, CHOLHDL, LDLDIRECT in the last 72 hours. Thyroid Function Tests: No results for input(s): TSH, T4TOTAL, FREET4, T3FREE, THYROIDAB in the last 72 hours. Anemia Panel: No results for input(s): VITAMINB12, FOLATE, FERRITIN, TIBC, IRON, RETICCTPCT  in the last 72 hours. Urine analysis:    Component Value Date/Time   COLORURINE STRAW (A) 11/25/2018 1224   APPEARANCEUR CLEAR 11/25/2018 1224   APPEARANCEUR Clear 06/25/2017 1042   LABSPEC 1.010 11/25/2018 1224   PHURINE 7.0 11/25/2018 1224   GLUCOSEU 50 (A) 11/25/2018 1224   HGBUR MODERATE (A) 11/25/2018 1224   BILIRUBINUR NEGATIVE 11/25/2018 1224   BILIRUBINUR neg 08/05/2017 1514   BILIRUBINUR Negative 06/25/2017 1042   KETONESUR NEGATIVE 11/25/2018 1224   PROTEINUR >=300 (A) 11/25/2018 1224   UROBILINOGEN 0.2 08/05/2017 1514   NITRITE NEGATIVE 11/25/2018 1224   LEUKOCYTESUR NEGATIVE 11/25/2018 1224   Sepsis Labs: @LABRCNTIP (procalcitonin:4,lacticidven:4) )No results found for this or any previous visit (from the past 240 hour(s)).   Radiological Exams on Admission: No results found.   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  Nephrology will follow.  Patient will have hemodialysis prior to fixing her peritoneal dialysis catheter.  At this point she has no other significant medical issues to be addressed.  We will continue monitoring.  #2 diabetes: Continue with home regimen with sliding scale insulin.  #3 leukocytosis: No fever.   DVT prophylaxis: Heparin Code Status: Full code Family Communication: No family available Disposition Plan: Home Consults called: Dr. Harvin Hazel, nephrology Admission status: Observation  Severity of Illness: The appropriate patient status for this patient is INPATIENT. Inpatient status is judged to be reasonable and necessary in order to provide the required intensity of service to ensure the patient's safety. The patient's presenting symptoms, physical exam findings, and initial radiographic and laboratory data in the context of their chronic comorbidities is felt to place them at high risk for further clinical deterioration. Furthermore, it is not anticipated that the patient will be medically stable for discharge from the hospital within 2 midnights of admission. The following factors support the patient status of inpatient.   " The patient's presenting symptoms include dysfunctional peritoneal catheter. " The worrisome physical exam findings include swelling and tenderness around the peritoneal catheter. " The initial radiographic and laboratory data are worrisome because of leukocytosis. " The chronic co-morbidities include end-stage renal disease.   * I certify that at the point of admission it is my clinical judgment that the patient will require inpatient hospital care spanning beyond 2 midnights from the point of admission due to high intensity of service, high risk for further deterioration and high frequency of surveillance required.Barbette Merino MD [facility] Pager [phone]  If 7PM-7AM, please contact night-coverage [email or web address] Password [email or web address]  12/04/2018, 5:52 AM

## 2018-12-04 DIAGNOSIS — E1022 Type 1 diabetes mellitus with diabetic chronic kidney disease: Secondary | ICD-10-CM

## 2018-12-04 DIAGNOSIS — Z9641 Presence of insulin pump (external) (internal): Secondary | ICD-10-CM

## 2018-12-04 LAB — GLUCOSE, CAPILLARY
Glucose-Capillary: 53 mg/dL — ABNORMAL LOW (ref 70–99)
Glucose-Capillary: 81 mg/dL (ref 70–99)
Glucose-Capillary: 135 mg/dL — ABNORMAL HIGH (ref 70–99)
Glucose-Capillary: 138 mg/dL — ABNORMAL HIGH (ref 70–99)
Glucose-Capillary: 51 mg/dL — ABNORMAL LOW (ref 70–99)
Glucose-Capillary: 82 mg/dL (ref 70–99)
Glucose-Capillary: 163 mg/dL — ABNORMAL HIGH (ref 70–99)

## 2018-12-04 LAB — COMPREHENSIVE METABOLIC PANEL
ALT: 5 U/L (ref 0–44)
AST: 12 U/L — ABNORMAL LOW (ref 15–41)
Albumin: 2.6 g/dL — ABNORMAL LOW (ref 3.5–5.0)
Alkaline Phosphatase: 77 U/L (ref 38–126)
Anion gap: 19 — ABNORMAL HIGH (ref 5–15)
BUN: 84 mg/dL — ABNORMAL HIGH (ref 6–20)
CO2: 18 mmol/L — ABNORMAL LOW (ref 22–32)
Calcium: 6.4 mg/dL — CL (ref 8.9–10.3)
Chloride: 101 mmol/L (ref 98–111)
Creatinine, Ser: 15.96 mg/dL — ABNORMAL HIGH (ref 0.44–1.00)
GFR calc Af Amer: 3 mL/min — ABNORMAL LOW (ref >60)
GFR calc non Af Amer: 3 mL/min — ABNORMAL LOW (ref >60)
Glucose, Bld: 120 mg/dL — ABNORMAL HIGH (ref 70–99)
Potassium: 4.5 mmol/L (ref 3.5–5.1)
Sodium: 138 mmol/L (ref 135–145)
Total Bilirubin: 0.5 mg/dL (ref 0.3–1.2)
Total Protein: 5.3 g/dL — ABNORMAL LOW (ref 6.5–8.1)

## 2018-12-04 LAB — CBC
HCT: 23.1 % — ABNORMAL LOW (ref 36.0–46.0)
Hemoglobin: 7.5 g/dL — ABNORMAL LOW (ref 12.0–15.0)
MCH: 27.2 pg (ref 26.0–34.0)
MCHC: 32.5 g/dL (ref 30.0–36.0)
MCV: 83.7 fL (ref 80.0–100.0)
Platelets: 282 10*3/uL (ref 150–400)
RBC: 2.76 MIL/uL — ABNORMAL LOW (ref 3.87–5.11)
RDW: 15.9 % — ABNORMAL HIGH (ref 11.5–15.5)
WBC: 4.5 10*3/uL (ref 4.0–10.5)
nRBC: 0 % (ref 0.0–0.2)

## 2018-12-04 MED ORDER — HYDROCODONE-ACETAMINOPHEN 5-325 MG PO TABS
1.0000 | ORAL_TABLET | ORAL | Status: DC | PRN
  Administered 2018-12-05 (×2): 1 via ORAL
  Filled 2018-12-04 (×2): qty 1

## 2018-12-04 MED ORDER — CALCIUM ACETATE (PHOS BINDER) 667 MG PO CAPS
1334.0000 mg | ORAL_CAPSULE | Freq: Three times a day (TID) | ORAL | Status: DC
  Administered 2018-12-04 (×3): 1334 mg via ORAL
  Filled 2018-12-04 (×3): qty 2

## 2018-12-04 MED ORDER — PARICALCITOL 1 MCG PO CAPS
1.0000 ug | ORAL_CAPSULE | Freq: Every day | ORAL | Status: DC
  Administered 2018-12-04 – 2018-12-05 (×2): 1 ug via ORAL
  Filled 2018-12-04 (×2): qty 1

## 2018-12-04 MED ORDER — B COMPLEX-C PO TABS
1.0000 | ORAL_TABLET | Freq: Every day | ORAL | Status: DC
  Administered 2018-12-04 – 2018-12-05 (×2): 1 via ORAL
  Filled 2018-12-04 (×2): qty 1

## 2018-12-04 MED ORDER — RENO CAPS 1 MG PO CAPS: 1.0000 mg | ORAL_CAPSULE | Freq: Every day | ORAL | Status: DC

## 2018-12-04 MED ORDER — LACTULOSE 10 GM/15ML PO SOLN: 20.0000 g | Freq: Every evening | ORAL | Status: DC | PRN

## 2018-12-04 MED ORDER — CHLORHEXIDINE GLUCONATE CLOTH 2 % EX PADS: 6.0000 | MEDICATED_PAD | Freq: Every day | CUTANEOUS | Status: DC

## 2018-12-04 MED ORDER — GENTAMICIN SULFATE 0.1 % EX CREA
1.0000 "application " | TOPICAL_CREAM | Freq: Every day | CUTANEOUS | Status: DC
  Filled 2018-12-04: qty 15

## 2018-12-04 MED ORDER — CYCLOBENZAPRINE HCL 10 MG PO TABS
10.0000 mg | ORAL_TABLET | Freq: Every evening | ORAL | Status: DC | PRN
  Administered 2018-12-04: 10 mg via ORAL
  Filled 2018-12-04: qty 1

## 2018-12-04 MED ORDER — INSULIN PUMP
SUBCUTANEOUS | Status: DC
  Administered 2018-12-04: 10:00:00 via SUBCUTANEOUS
  Filled 2018-12-04: qty 1

## 2018-12-04 MED ORDER — METOPROLOL SUCCINATE ER 100 MG PO TB24
100.0000 mg | ORAL_TABLET | Freq: Two times a day (BID) | ORAL | Status: DC
  Administered 2018-12-04 – 2018-12-05 (×4): 100 mg via ORAL
  Filled 2018-12-04 (×4): qty 1

## 2018-12-04 MED ORDER — CYCLOBENZAPRINE HCL 5 MG PO TABS
5.0000 mg | ORAL_TABLET | Freq: Once | ORAL | Status: AC
  Administered 2018-12-04: 5 mg via ORAL
  Filled 2018-12-04: qty 1

## 2018-12-04 MED ORDER — DOCUSATE SODIUM 100 MG PO CAPS
100.0000 mg | ORAL_CAPSULE | Freq: Two times a day (BID) | ORAL | Status: DC
  Administered 2018-12-04 – 2018-12-05 (×4): 100 mg via ORAL
  Filled 2018-12-04 (×4): qty 1

## 2018-12-04 MED ORDER — FOLIC ACID 1 MG PO TABS
1.0000 mg | ORAL_TABLET | Freq: Every day | ORAL | Status: DC
  Administered 2018-12-04 – 2018-12-05 (×2): 1 mg via ORAL
  Filled 2018-12-04 (×2): qty 1

## 2018-12-04 NOTE — Progress Notes (Addendum)
PROGRESS NOTE  Kelly Huff [id number] DOB: [birth year] DOA: 12/03/2018 PCP: Rubie Maid, MD  HPI/Recap of past 24 hours: HPI from Dr Pablo Lawrence is a 33 y.o. female with medical history significant of end-stage renal disease on peritoneal dialysis, diabetes, hypertension who was [place] secondary to complications of her peritoneal dialysis catheter.    On 8/2 patient noted PD was not draining, had a revision on 12/01/2018, which worked for the day and on 8/7 still had the same problem with drainage.  Patient opted to be transferred over to [facility] for further management.  Nephrology consulted.  In the ED, patient remained stable, creatinine noted to be 15.58.  Patient admitted for further management    Today, patient denies any new complaints, denies any shortness of breath, chest pain, fever/chills, no pain, nausea/vomiting/diarrhea.  Only mildly distended abdomen noted.  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 at midnight  ESRD/hypocalcemia Denies any uremic symptoms Nephrology on board, no need for emergent dialysis Plan for HD tomorrow after [facility] has been placed  Diabetes mellitus type 1 Patient with insulin pump, continue CBG checks before meals and at bedtime  Hypertension BP stable Continue home meds  Anemia of chronic kidney disease Hemoglobin has been noted to be downtrending since 10/2018 No signs of obvious bleeding Type and screen Daily CBC          Malnutrition Type:      Malnutrition Characteristics:      Nutrition Interventions:       Estimated body mass index is 28.44 kg/m as calculated from the following:   Height as of 09/26/18: 5\' 7"  (1.702 m).   Weight as of 11/21/18: 82.4 kg.     Code Status: Full  Family Communication: None at bedside  Disposition Plan: Home once work-up complete   Consultants:  Nephrology  IR  Procedures:  Possible placement of TDC on 8/10  Antimicrobials:  None  DVT prophylaxis: Heparin   Objective: Vitals:   12/03/18 2041 12/04/18 0606 12/04/18 0949  BP: (!) 159/91 116/67 138/90  Pulse: 90 87 86  Resp: 18 16 18   Temp: 97.9 F (36.6 C) 99.3 F (37.4 C) 98.8 F (37.1 C)  TempSrc: Oral Oral Oral  SpO2: 100% 97% 99%   No intake or output data in the 24 hours ending 12/04/18 1402 There were no vitals filed for this visit.  Exam:  General: NAD   Cardiovascular: S1, S2 present  Respiratory: CTAB  Abdomen: Soft, nontender, slightly distended, bowel sounds present.  Laparoscopic incision sites intact, PD cath on the right side of the abdomen  Musculoskeletal: No bilateral pedal edema noted  Skin: Normal  Psychiatry: Normal mood    Data Reviewed: CBC: Recent Labs  Lab 12/03/18 2235 12/04/18 0555  WBC 5.2 4.5  HGB 8.0* 7.5*  HCT 24.1* 23.1*  MCV 82.5 83.7  PLT 282 [id number]   Basic Metabolic Panel: Recent Labs  Lab 12/03/18 2235 12/04/18 0555  NA  --  138  K  --  4.5  CL  --  101  CO2  --  18*  GLUCOSE  --  120*  BUN  --  84*  CREATININE 15.58* 15.96*  CALCIUM  --  6.4*   GFR: Estimated Creatinine Clearance: 5.5 mL/min (A) (by C-G formula based on SCr of 15.96 mg/dL (H)). Liver Function Tests: Recent Labs  Lab 12/04/18 0555  AST 12*  ALT 5  ALKPHOS 77  BILITOT 0.5  PROT 5.3*  ALBUMIN 2.6*   No results for input(s): LIPASE, AMYLASE in the last 168 hours. No results for input(s): AMMONIA in the last 168 hours. Coagulation  Profile: No results for input(s): INR, PROTIME in the last 168 hours. Cardiac Enzymes: No results for input(s): CKTOTAL, CKMB, CKMBINDEX, TROPONINI in the last 168 hours. BNP (last 3 results) No results for input(s): PROBNP in the last 8760 hours. HbA1C: Recent Labs    12/03/18 2235  HGBA1C 8.7*   CBG: Recent Labs  Lab 12/03/18 2134 12/04/18 0246 12/04/18 0310 12/04/18 0708 12/04/18 1120  GLUCAP 75 53* 81 135* 138*   Lipid Profile: No results for input(s): CHOL, HDL, LDLCALC, TRIG, CHOLHDL, LDLDIRECT in the last 72 hours. Thyroid Function Tests: No results for input(s): TSH, T4TOTAL, FREET4, T3FREE, THYROIDAB in the last 72 hours. Anemia Panel: No results for input(s): VITAMINB12, FOLATE, FERRITIN, TIBC, IRON, RETICCTPCT in the last 72 hours. Urine analysis:    Component Value Date/Time   COLORURINE STRAW (A) 11/25/2018 1224   APPEARANCEUR CLEAR 11/25/2018 1224   APPEARANCEUR Clear 06/25/2017 1042   LABSPEC 1.010 11/25/2018 1224   PHURINE 7.0 11/25/2018 1224   GLUCOSEU 50 (A) 11/25/2018 1224   HGBUR MODERATE (A) 11/25/2018 1224   BILIRUBINUR NEGATIVE 11/25/2018 1224   BILIRUBINUR neg 08/05/2017 1514   BILIRUBINUR Negative 06/25/2017 1042   KETONESUR NEGATIVE 11/25/2018 1224   PROTEINUR >=300 (A) 11/25/2018 1224   UROBILINOGEN 0.2 08/05/2017 1514   NITRITE NEGATIVE 11/25/2018 1224   LEUKOCYTESUR NEGATIVE 11/25/2018 1224   Sepsis Labs: @LABRCNTIP (procalcitonin:4,lacticidven:4)  )No results found for this or any previous visit (from the past 240 hour(s)).    Studies: No results found.  Scheduled Meds: . B-complex with vitamin C  1 tablet Oral Daily   And  . folic acid  1 mg Oral Daily  . calcium acetate  1,334 mg Oral TID WC  . docusate sodium  100 mg Oral BID  . gentamicin cream  1 application Topical Daily  . heparin  5,000 Units Subcutaneous Q8H  . metoprolol succinate  100 mg Oral BID  . paricalcitol  1 mcg Oral Daily    Continuous Infusions: .  insulin pump 4.9 mL/hr at 12/04/18 0931     LOS: 1 day     Alma Friendly, MD [facility]  If 7PM-7AM, please contact night-coverage [email or web address] 12/04/2018, 2:02 PM

## 2018-12-04 NOTE — Consult Note (Signed)
Chief Complaint: Patient was seen in consultation today for ESRD/tunneled HD catheter placement.  Referring Physician(s): Janalee Dane  Supervising Physician: Corrie Mckusick  Patient Status: [facility] - In-pt  History of Present Illness: Kelly Huff is a 33 y.o. female with a past medical history of hypertension, ESRD previously on PD, and diabetes mellitus type I. She presented to [place] ED [id number] due to complications with PD catheter. Of note, she had PD catheter revision in OR by Dr. Raul Del on 12/01/2018. She has completed two nights of PD since this procedure. States that now her PD catheter is pulling, has no drainage, and there is swelling around catheter site. She was transferred to [facility] for admission and further management/hemodialysis. Nephrology was consulted who recommended IR consultation for possible tunneled HD catheter placement so that patient can undergo hemodialysis.  IR consulted by Anice Paganini, PA-C for possible image-guided tunneled HD catheter placement. Patient awake and alert sitting in bed eating lunch with no complaints at this time. Denies fever, chills, chest pain, dyspnea, abdominal pain, or headache.   Past Medical History:  Diagnosis Date   Chronic kidney disease    Diabetes mellitus without complication ([place])    History of pre-eclampsia 2016   mild   Hypertension     Past Surgical History:  Procedure Laterality Date   CESAREAN SECTION     DILATION AND CURETTAGE OF UTERUS     x 4    Allergies: Morphine, Sulfamethoxazole-trimethoprim, and Trulicity [dulaglutide]  Medications: Prior to Admission medications   Medication Sig Start Date End Date Taking? Authorizing Provider  amLODipine (NORVASC) 10 MG tablet Take 10 mg by mouth every evening.  11/06/18  Yes [provider]  B Complex-C-Folic Acid (RENO CAPS) 1 MG CAPS Take 1 mg by mouth daily. 08/02/18  Yes [provider]  calcium acetate (PHOSLO)  667 MG tablet Take 1,334 mg by mouth 3 (three) times daily with meals. 11/05/18  Yes [provider]  clobetasol ointment (TEMOVATE) [id number] % Apply 1 application topically 2 (two) times daily as needed for itching. 03/07/18  Yes [provider]  cloNIDine (CATAPRES - DOSED IN MG/24 HR) 0.1 mg/24hr patch Place 0.1 mg onto the skin every Sunday.  10/10/18  Yes [provider]  cyclobenzaprine (FLEXERIL) 10 MG tablet Take 10 mg by mouth at bedtime as needed for muscle spasms.    Yes [provider]  docusate sodium (COLACE) 100 MG capsule Take 100 mg by mouth 2 (two) times daily.   Yes [provider]  gentamicin cream (GARAMYCIN) 0.1 % Apply 1 application topically See admin instructions. Apply small amount to catheter exit site once daily 09/07/18  Yes [provider]  HYDROcodone-acetaminophen (NORCO/VICODIN) 5-325 MG tablet Take 1 tablet by mouth every 4 (four) hours as needed (pain).  12/01/18 12/06/18 Yes [provider]  Insulin Human (INSULIN PUMP) SOLN Inject into the skin continuous. Novolog   Yes [provider]  lactulose (CHRONULAC) 10 GM/15ML solution Take 20 g by mouth at bedtime as needed (constipation).  11/22/18  Yes [provider]  metoprolol succinate (TOPROL-XL) 100 MG 24 hr tablet Take 100 mg by mouth 2 (two) times daily. 10/18/18  Yes [provider]  olmesartan (BENICAR) 40 MG tablet Take 40 mg by mouth every morning. 10/13/18  Yes [provider]  paricalcitol (ZEMPLAR) 1 MCG capsule Take 1 mcg by mouth daily.   Yes [provider]  ondansetron (ZOFRAN ODT) 4 MG disintegrating  tablet Take 1 tablet (4 mg total) by mouth every 8 (eight) hours as needed for nausea or vomiting. Patient not taking: Reported on 12/03/2018 07/29/18   Geradine Girt, DO     Family History  Problem Relation Age of Onset   Hypertension Mother    Cancer Mother        Uterine vs cervical (pt unsure),     Schizophrenia Brother    Breast cancer Neg Hx     Social History   Socioeconomic History   Marital status: Married    Spouse name: Not on file   Number of children: Not on file   Years of education: Not on file   Highest education level: Not on file  Occupational History   Not on file  Social Needs   Financial resource strain: Not on file   Food insecurity    Worry: Not on file    Inability: Not on file   Transportation needs    Medical: Not on file    Non-medical: Not on file  Tobacco Use   Smoking status: Never Smoker   Smokeless tobacco: Never Used  Substance and Sexual Activity   Alcohol use: No   Drug use: No   Sexual activity: Yes    Birth control/protection: None, Surgical    Comment: tubial  Lifestyle   Physical activity    Days per week: Not on file    Minutes per session: Not on file   Stress: Not on file  Relationships   Social connections    Talks on phone: Not on file    Gets together: Not on file    Attends religious service: Not on file    Active member of club or organization: Not on file    Attends meetings of clubs or organizations: Not on file    Relationship status: Not on file  Other Topics Concern   Not on file  Social History Narrative   Not on file     Review of Systems: A 12 point ROS discussed and pertinent positives are indicated in the HPI above.  All other systems are negative.  Review of Systems  Constitutional: Negative for chills and fever.  Respiratory: Negative for shortness of breath and wheezing.   Cardiovascular: Negative for chest pain and palpitations.  Gastrointestinal: Negative for abdominal pain.  Neurological: Negative for headaches.  Psychiatric/Behavioral: Negative for behavioral problems and confusion.    Vital Signs: BP 138/90 (BP Location: Right Arm)    Pulse 86    Temp 98.8 F (37.1 C) (Oral)    Resp 18    SpO2 99%   Physical Exam Vitals signs and nursing note reviewed.    Constitutional:      General: She is not in acute distress.    Appearance: Normal appearance.  Cardiovascular:     Rate and Rhythm: Normal rate and regular rhythm.     Heart sounds: Normal heart sounds. No murmur.  Pulmonary:     Effort: Pulmonary effort is normal. No respiratory distress.     Breath sounds: Normal breath sounds. No wheezing.  Skin:    General: Skin is warm and dry.  Neurological:     Mental Status: She is alert and oriented to person, place, and time.  Psychiatric:        Mood and Affect: Mood normal.        Behavior: Behavior normal.        Thought Content: Thought content normal.  Judgment: Judgment normal.      MD Evaluation Airway: WNL Heart: WNL Abdomen: WNL Chest/ Lungs: WNL ASA  Classification: 3 Mallampati/Airway Score: One   Imaging: Ct Abdomen Pelvis Wo Contrast  Result Date: 11/25/2018 CLINICAL DATA:  Abdominal pain, constipation, incomplete dialysis EXAM: CT ABDOMEN AND PELVIS WITHOUT CONTRAST TECHNIQUE: Multidetector CT imaging of the abdomen and pelvis was performed following the standard protocol without IV contrast. COMPARISON:  Abdominal radiograph, 11/24/2018, CT abdomen pelvis, 09/02/2018 FINDINGS: Lower chest: No acute abnormality. Hepatobiliary: No solid liver abnormality is seen. No gallstones, gallbladder wall thickening, or biliary dilatation. Pancreas: Unremarkable. No pancreatic ductal dilatation or surrounding inflammatory changes. Spleen: Normal in size without significant abnormality. Adrenals/Urinary Tract: Adrenal glands are unremarkable. Kidneys are normal, without renal calculi, solid lesion, or hydronephrosis. Thickening of the urinary bladder. Stomach/Bowel: Stomach is within normal limits. Appendix appears normal. No evidence of bowel wall thickening, distention, or inflammatory changes. Scattered stool in the colon. Vascular/Lymphatic: No significant vascular findings are present. No enlarged abdominal or pelvic lymph  nodes. Reproductive: No mass or other significant abnormality. Other: No abdominal wall hernia or abnormality. No abdominopelvic ascites. Peritoneal dialysis catheter is positioned with tip in the central low abdomen. Musculoskeletal: No acute or significant osseous findings. IMPRESSION: 1.  No large burden of stool in the colon. 2. Peritoneal dialysis catheter is positioned with tip in the central low abdomen. 3. Thickening of the urinary bladder, nonspecific. Correlate with urinalysis. Electronically Signed   By: Eddie Candle M.D.   On: 11/25/2018 13:20   Dg Abd 1 View  Result Date: 11/24/2018 CLINICAL DATA:  Peritoneal dialysis catheter.  Constipation. EXAM: ABDOMEN - 1 VIEW COMPARISON:  CT 09/02/2018 FINDINGS: Interval placement of catheter within the right hemiabdomen with distal tip coiled in left hemipelvis. No evidence of catheter kinking or discontinuity. Mild colonic stool burden. No gross free intraperitoneal air. Osseous structures are intact and unremarkable. IMPRESSION: Mild colonic stool burden. Electronically Signed   By: Davina Poke M.D.   On: 11/24/2018 10:20    Labs:  CBC: Recent Labs    11/21/18 0906 11/25/18 0921 12/03/18 2235 12/04/18 0555  WBC 5.7 5.0 5.2 4.5  HGB 9.6* 9.6* 8.0* 7.5*  HCT 29.1* 29.6* 24.1* 23.1*  PLT 290 295 282 282    COAGS: No results for input(s): INR, APTT in the last 8760 hours.  BMP: Recent Labs    09/26/18 0947 11/21/18 0906 11/25/18 0921 12/03/18 2235 12/04/18 0555  NA 135 135 137  --  138  K 5.1 4.8 5.2*  --  4.5  CL 98 96* 103  --  101  CO2 24 24 20*  --  18*  GLUCOSE 200* 147* 123*  --  120*  BUN 39* 82* 80*  --  84*  CALCIUM 8.9 8.3* 7.6*  --  6.4*  CREATININE 9.34* 15.75* 17.46* 15.58* 15.96*  GFRNONAA 5* 3* 2* 3* 3*  GFRAA 6* 3* 3* 3* 3*    LIVER FUNCTION TESTS: Recent Labs    09/26/18 0947 11/21/18 0906 11/25/18 0921 12/04/18 0555  BILITOT 0.3 0.6 0.4 0.5  AST 15 14* 14* 12*  ALT 10 13 10 5   ALKPHOS 121  99 101 77  PROT 7.6 7.0 6.3* 5.3*  ALBUMIN 3.6 3.3* 3.0* 2.6*     Assessment and Plan:  ESRD on HD. Plan for image-guided tunneled HD catheter placement tentatively for tomorrow 12/05/2018 in IR with Dr. Anselm Pancoast. Patient will be NPO at midnight. Afebrile and WBCs WNL. Heparin held  per IR protocol. INR pending for 0500 tomorrow.  Risks and benefits discussed with the patient including, but not limited to bleeding, infection, vascular injury, pneumothorax which may require chest tube placement, air embolism or even death. All of the patient's questions were answered, patient is agreeable to proceed. Consent signed and in chart.   Thank you for this interesting consult.  I greatly enjoyed meeting Kelly Huff and look forward to participating in their care.  A copy of this report was sent to the requesting provider on this date.  Electronically Signed: Earley Abide, PA-C 12/04/2018, 1:12 PM   I spent a total of 40 Minutes in face to face in clinical consultation, greater than 50% of which was counseling/coordinating care for ESRD/tunneled HD catheter placement.

## 2018-12-04 NOTE — Consult Note (Addendum)
[facility] Renal Consultation Note    Indication for Consultation:  Management of ESRD/hemodialysis, anemia, hypertension/volume, and secondary hyperparathyroidism.  HPI: Kelly Huff is a 33 y.o. 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 Patient has been on dialysis since 05/2018. She was initially on hemodialysis via [address] at [facility]. She had a PD catheter placed in 08/2018 by Dr. Raul Del. Patient reports PD was going very well until 8/2 when catheter became resistant to drainage. She was initially given a bowel regiment for constipation but had minimal improvement. She was then seen by Dr. Raul Del on 12/01/2018 for revision of PD cath as fallopian tissue was reportedly occluding the catheter. After this procedure, she completed PD successfully on the evening on 12/01/2018. On 12/02/2018, she infused 2,500 mL of fluid but only 400 mL would drain. She reports fluid collected in the left side of her abdomen. A CT was completed which showed subcutaneous edema and ill-defined fluid in L abdominal wall. She reports she was told one of her laparoscopic incisions was leaking fluid and would need about 2 weeks to heal. She was transferred to [facility] to initiate hemodialysis.  Today, patient reports the swelling in her abdomen has improved. She denies SOB, dyspnea, CP, palpitations, N/V/D, metallic taste, and fatigue/weakness. She reports she still has a good appetite and feels like she is at her baseline health except for abdominal swelling. She does say she took a dose of veltassa earlier this week for hyperkalemia. K+ 4.5, BUN 84, Cr 15.96, Calcium 6.4, Albumin 2.6, WBC 4.5, Hgb 7.5. She is noted to have hypocalcemia with corrected calcium of 7.5, and reports this is an ongoing issue. She takes Phoslo but often misses multiple doses per day.  Patient is agreeable to have [place] placed.  She would like to find an outpatient dialysis center closer to her home, and resume PD as soon as possible.   Past Medical History:  Diagnosis Date  . Chronic kidney disease   . Diabetes mellitus without complication ([place])   . History of pre-eclampsia 2016   mild  . Hypertension    Past Surgical History:  Procedure Laterality Date  . CESAREAN SECTION    . DILATION AND CURETTAGE OF UTERUS     x 4   Family History  Problem Relation Age of Onset  . Hypertension Mother   . Cancer Mother        Uterine vs cervical (pt unsure),   . Schizophrenia Brother   . Breast cancer Neg Hx    Social History:  reports that she has never smoked. She has never used smokeless tobacco. She reports that she does not drink alcohol or use drugs.  ROS: As per HPI otherwise negative.    Physical Exam: Vitals:   12/03/18 2041 12/04/18 0606 12/04/18 0949  BP: (!) 159/91 116/67 138/90  Pulse: 90 87 86  Resp: 18 16 18   Temp: 97.9 F (36.6 C) 99.3 F (37.4 C) 98.8 F (37.1 C)  TempSrc: Oral Oral Oral  SpO2: 100% 97% 99%     General: Well developed, well nourished, in no acute distress. Head: Normocephalic, atraumatic, sclera non-icteric, mucus membranes are moist. Neck: Supple without lymphadenopathy/masses. JVD not elevated. Lungs: Clear bilaterally to auscultation without wheezes, rales, or rhonchi. Breathing is unlabored. Heart: RRR with normal S1, S2. No murmurs, rubs, or gallops appreciated. Abdomen: Soft, distended with subcutaneous  edema L side. No drainage from laparoscopic incisions. + PD cath R side. + BS Musculoskeletal:  Strength and tone appear normal for age. Lower extremities: No edema or ischemic changes, no open wounds. Neuro: Alert and oriented X 3. Moves all extremities spontaneously. Psych:  Responds to questions appropriately with a normal affect.  Allergies  Allergen Reactions  . Morphine Itching and Other (See Comments)    Hallucination  .  Sulfamethoxazole-Trimethoprim Hives  . Trulicity [Dulaglutide] Other (See Comments)    She is DM Type 1. Was prescribed but damaged her kidneys   Prior to Admission medications   Medication Sig Start Date End Date Taking? Authorizing Provider  amLODipine (NORVASC) 10 MG tablet Take 10 mg by mouth every evening.  11/06/18  Yes [provider]  B Complex-C-Folic Acid (RENO CAPS) 1 MG CAPS Take 1 mg by mouth daily. 08/02/18  Yes [provider]  calcium acetate (PHOSLO) 667 MG tablet Take 1,334 mg by mouth 3 (three) times daily with meals. 11/05/18  Yes [provider]  clobetasol ointment (TEMOVATE) [id number] % Apply 1 application topically 2 (two) times daily as needed for itching. 03/07/18  Yes [provider]  cloNIDine (CATAPRES - DOSED IN MG/24 HR) 0.1 mg/24hr patch Place 0.1 mg onto the skin every Sunday.  10/10/18  Yes [provider]  cyclobenzaprine (FLEXERIL) 10 MG tablet Take 10 mg by mouth at bedtime as needed for muscle spasms.    Yes [provider]  docusate sodium (COLACE) 100 MG capsule Take 100 mg by mouth 2 (two) times daily.   Yes [provider]  gentamicin cream (GARAMYCIN) 0.1 % Apply 1 application topically See admin instructions. Apply small amount to catheter exit site once daily 09/07/18  Yes [provider]  HYDROcodone-acetaminophen (NORCO/VICODIN) 5-325 MG tablet Take 1 tablet by mouth every 4 (four) hours as needed (pain).  12/01/18 12/06/18 Yes [provider]  Insulin Human (INSULIN PUMP) SOLN Inject into the skin continuous. Novolog   Yes [provider]  lactulose (CHRONULAC) 10 GM/15ML solution Take 20 g by mouth at bedtime as needed (constipation).  11/22/18  Yes [provider]  metoprolol succinate (TOPROL-XL) 100 MG 24 hr tablet Take 100 mg by mouth 2 (two) times daily. 10/18/18  Yes [provider]  olmesartan (BENICAR) 40 MG tablet Take 40 mg by mouth every morning.  10/13/18  Yes [provider]  paricalcitol (ZEMPLAR) 1 MCG capsule Take 1 mcg by mouth daily.   Yes [provider]  ondansetron (ZOFRAN ODT) 4 MG disintegrating tablet Take 1 tablet (4 mg total) by mouth every 8 (eight) hours as needed for nausea or vomiting. Patient not taking: Reported on 12/03/2018 07/29/18   Geradine Girt, DO   Current Facility-Administered Medications  Medication Dose Route Frequency Provider Last Rate Last Dose  . B-complex with vitamin C tablet 1 tablet  1 tablet Oral Daily Alma Friendly, MD   1 tablet at 12/04/18 [id number]   And  . folic acid (FOLVITE) tablet 1 mg  1 mg Oral Daily Alma Friendly, MD   1 mg at 12/04/18 0905  . calcium acetate (PHOSLO) capsule 1,334 mg  1,334 mg Oral TID WC Alma Friendly, MD   1,334 mg at 12/04/18 0900  . cyclobenzaprine (FLEXERIL) tablet 10 mg  10 mg Oral QHS PRN Alma Friendly, MD      . docusate sodium (COLACE) capsule 100 mg  100 mg Oral BID Alma Friendly,  MD   100 mg at 12/04/18 0905  . gentamicin cream (GARAMYCIN) 0.1 % 1 application  1 application Topical Daily Alma Friendly, MD      . heparin injection 5,000 Units  5,000 Units Subcutaneous Q8H Elwyn Reach, MD   5,000 Units at 12/03/18 2229  . HYDROcodone-acetaminophen (NORCO/VICODIN) 5-325 MG per tablet 1 tablet  1 tablet Oral Q4H PRN Alma Friendly, MD      . insulin pump   Subcutaneous Continuous Alma Friendly, MD      . lactulose (CHRONULAC) 10 GM/15ML solution 20 g  20 g Oral QHS PRN Alma Friendly, MD      . metoprolol succinate (TOPROL-XL) 24 hr tablet 100 mg  100 mg Oral BID Alma Friendly, MD   100 mg at 12/04/18 0905  . ondansetron (ZOFRAN) tablet 4 mg  4 mg Oral Q6H PRN Elwyn Reach, MD       Or  . ondansetron (ZOFRAN) injection 4 mg  4 mg Intravenous Q6H PRN Elwyn Reach, MD      . paricalcitol (ZEMPLAR) capsule 1 mcg  1 mcg Oral Daily Alma Friendly, MD   1 mcg at  12/04/18 [id number]   Labs: Basic Metabolic Panel: Recent Labs  Lab 12/03/18 2235 12/04/18 0555  NA  --  138  K  --  4.5  CL  --  101  CO2  --  18*  GLUCOSE  --  120*  BUN  --  84*  CREATININE 15.58* 15.96*  CALCIUM  --  6.4*   Liver Function Tests: Recent Labs  Lab 12/04/18 0555  AST 12*  ALT 5  ALKPHOS 77  BILITOT 0.5  PROT 5.3*  ALBUMIN 2.6*   CBC: Recent Labs  Lab 12/03/18 2235 12/04/18 0555  WBC 5.2 4.5  HGB 8.0* 7.5*  HCT 24.1* 23.1*  MCV 82.5 83.7  PLT 282 282   CBG: Recent Labs  Lab 12/03/18 2042 12/03/18 2134 12/04/18 0246 12/04/18 0310 12/04/18 0708  GLUCAP 49* 75 53* 81 135*    Dialysis Orders: [facility] CCPD 7X Week, EDW 72 (kg) Ca 2.5 (mEq/L) Mg 0.5 (mEq/L) Dextrose 1.5; 2.5; 4.25 %, # Exchanges 5, fill vol 2500 mL, Dwell Time 1 hrs 30 min, last fill vol 0 mL, Day Exchanges 0, daytime fill vol 2000 mL, Day Dwell Time 5 hrs 0 min   Assessment/Plan: 1.  PD catheter malfunction: PD catheter occluded, underwent revision by Dr. Raul Del on 12/01/2018 but then developed subcutaneous edema and fluid would not drain. Last full PD was 12/02/2018. Will require HD temporarily until she is able to resume PD again- IR consulted. Will follow up with Dr. Jackelyn Knife.   2.  ESRD:  [facility] since 08/2018, last full PD on 12/02/2018. Cr is elevated and patient is significantly above her EDW, howevr she has no uremic or respiratory symptoms. No need for emergent dialysis today. Will consult IR for [facility] placement tomorrow and start HD once catheter is in place. Please discuss CLIP with renal navigator on 8/10. Patient does not want to return to [facility] and would like [facility] if possible.  3.  Hypertension/volume: BP overall controlled. Lower volume gradually once HD is initiated. 1200 mL fluid restriction.  4.  Anemia: Hgb 7.5. Asymptomatic, no bleeding reported. Will need ESA with hemodialysis. Repeat CBC in AM. Transfuse PRN 5.  Metabolic bone disease: Corrected calcium 7.5. Reports she frequently  misses doses of Phoslo. Calcium has been low recently on outpatient  labs. Resume phoslo, follow calcium. High calcium bath on HD. RFP in AM. 6.  Nutrition:  Renal diet with fluid restrictions.   Anice Paganini, PA-C 12/04/2018, 10:21 AM  [facility] Pager: [phone]  Pt seen, examined and agree w A/P as above.  Kelly Splinter  MD 12/04/2018, 3:23 PM

## 2018-12-04 NOTE — Progress Notes (Signed)
paient refusing sliding scale insulin coverage states she has a insulin pump . Notifieb on call physician at [facility] .

## 2018-12-04 NOTE — Plan of Care (Signed)
  Problem: Health Behavior/Discharge Planning: Goal: Ability to manage health-related needs will improve Outcome: Progressing   

## 2018-12-05 ENCOUNTER — Inpatient Hospital Stay (HOSPITAL_COMMUNITY): Payer: Medicare Other

## 2018-12-05 ENCOUNTER — Encounter (HOSPITAL_COMMUNITY): Payer: Self-pay | Admitting: Diagnostic Radiology

## 2018-12-05 DIAGNOSIS — Z992 Dependence on renal dialysis: Secondary | ICD-10-CM

## 2018-12-05 DIAGNOSIS — N186 End stage renal disease: Secondary | ICD-10-CM

## 2018-12-05 DIAGNOSIS — T85611A Breakdown (mechanical) of intraperitoneal dialysis catheter, initial encounter: Principal | ICD-10-CM

## 2018-12-05 HISTORY — PX: IR FLUORO GUIDE CV LINE RIGHT: IMG2283

## 2018-12-05 HISTORY — PX: IR US GUIDE VASC ACCESS RIGHT: IMG2390

## 2018-12-05 LAB — IRON AND TIBC
Iron: 134 ug/dL (ref 28–170)
Saturation Ratios: 75 % — ABNORMAL HIGH (ref 10.4–31.8)
TIBC: 179 ug/dL — ABNORMAL LOW (ref 250–450)
UIBC: 45 ug/dL

## 2018-12-05 LAB — RENAL FUNCTION PANEL
Albumin: 2.7 g/dL — ABNORMAL LOW (ref 3.5–5.0)
Albumin: 2.6 g/dL — ABNORMAL LOW (ref 3.5–5.0)
Anion gap: 17 — ABNORMAL HIGH (ref 5–15)
Anion gap: 16 — ABNORMAL HIGH (ref 5–15)
BUN: 85 mg/dL — ABNORMAL HIGH (ref 6–20)
BUN: 87 mg/dL — ABNORMAL HIGH (ref 6–20)
CO2: 19 mmol/L — ABNORMAL LOW (ref 22–32)
CO2: 19 mmol/L — ABNORMAL LOW (ref 22–32)
Calcium: 6.7 mg/dL — ABNORMAL LOW (ref 8.9–10.3)
Calcium: 6.7 mg/dL — ABNORMAL LOW (ref 8.9–10.3)
Chloride: 100 mmol/L (ref 98–111)
Chloride: 100 mmol/L (ref 98–111)
Creatinine, Ser: 16.15 mg/dL — ABNORMAL HIGH (ref 0.44–1.00)
Creatinine, Ser: 16.89 mg/dL — ABNORMAL HIGH (ref 0.44–1.00)
GFR calc Af Amer: 3 mL/min — ABNORMAL LOW (ref >60)
GFR calc Af Amer: 3 mL/min — ABNORMAL LOW (ref >60)
GFR calc non Af Amer: 3 mL/min — ABNORMAL LOW (ref >60)
GFR calc non Af Amer: 2 mL/min — ABNORMAL LOW (ref >60)
Glucose, Bld: 160 mg/dL — ABNORMAL HIGH (ref 70–99)
Glucose, Bld: 207 mg/dL — ABNORMAL HIGH (ref 70–99)
Phosphorus: 7.4 mg/dL — ABNORMAL HIGH (ref 2.5–4.6)
Phosphorus: 6.6 mg/dL — ABNORMAL HIGH (ref 2.5–4.6)
Potassium: 4.9 mmol/L (ref 3.5–5.1)
Potassium: 4.7 mmol/L (ref 3.5–5.1)
Sodium: 136 mmol/L (ref 135–145)
Sodium: 135 mmol/L (ref 135–145)

## 2018-12-05 LAB — CBC
HCT: 23.2 % — ABNORMAL LOW (ref 36.0–46.0)
Hemoglobin: 7.6 g/dL — ABNORMAL LOW (ref 12.0–15.0)
MCH: 27.5 pg (ref 26.0–34.0)
MCHC: 32.8 g/dL (ref 30.0–36.0)
MCV: 84.1 fL (ref 80.0–100.0)
Platelets: 274 10*3/uL (ref 150–400)
RBC: 2.76 MIL/uL — ABNORMAL LOW (ref 3.87–5.11)
RDW: 15.4 % (ref 11.5–15.5)
WBC: 4.6 10*3/uL (ref 4.0–10.5)
nRBC: 0 % (ref 0.0–0.2)

## 2018-12-05 LAB — CBC WITH DIFFERENTIAL/PLATELET
Abs Immature Granulocytes: 0.01 10*3/uL (ref 0.00–0.07)
Basophils Absolute: 0 10*3/uL (ref 0.0–0.1)
Basophils Relative: 1 %
Eosinophils Absolute: 0.2 10*3/uL (ref 0.0–0.5)
Eosinophils Relative: 5 %
HCT: 23.8 % — ABNORMAL LOW (ref 36.0–46.0)
Hemoglobin: 7.9 g/dL — ABNORMAL LOW (ref 12.0–15.0)
Immature Granulocytes: 0 %
Lymphocytes Relative: 24 %
Lymphs Abs: 1 10*3/uL (ref 0.7–4.0)
MCH: 27.7 pg (ref 26.0–34.0)
MCHC: 33.2 g/dL (ref 30.0–36.0)
MCV: 83.5 fL (ref 80.0–100.0)
Monocytes Absolute: 0.4 10*3/uL (ref 0.1–1.0)
Monocytes Relative: 8 %
Neutro Abs: 2.6 10*3/uL (ref 1.7–7.7)
Neutrophils Relative %: 62 %
Platelets: 278 10*3/uL (ref 150–400)
RBC: 2.85 MIL/uL — ABNORMAL LOW (ref 3.87–5.11)
RDW: 15.5 % (ref 11.5–15.5)
WBC: 4.2 10*3/uL (ref 4.0–10.5)
nRBC: 0 % (ref 0.0–0.2)

## 2018-12-05 LAB — TYPE AND SCREEN
ABO/RH(D): B POS
Antibody Screen: NEGATIVE

## 2018-12-05 LAB — GLUCOSE, CAPILLARY
Glucose-Capillary: 144 mg/dL — ABNORMAL HIGH (ref 70–99)
Glucose-Capillary: 183 mg/dL — ABNORMAL HIGH (ref 70–99)
Glucose-Capillary: 211 mg/dL — ABNORMAL HIGH (ref 70–99)

## 2018-12-05 LAB — PROTIME-INR
INR: 1.1 (ref 0.8–1.2)
Prothrombin Time: 14.3 seconds (ref 11.4–15.2)

## 2018-12-05 LAB — FERRITIN: Ferritin: 1045 ng/mL — ABNORMAL HIGH (ref 11–307)

## 2018-12-05 MED ORDER — DARBEPOETIN ALFA 60 MCG/0.3ML IJ SOSY
PREFILLED_SYRINGE | INTRAMUSCULAR | Status: AC
  Filled 2018-12-05: qty 0.3

## 2018-12-05 MED ORDER — CEFAZOLIN SODIUM-DEXTROSE 2-4 GM/100ML-% IV SOLN
INTRAVENOUS | Status: AC
  Filled 2018-12-05: qty 100

## 2018-12-05 MED ORDER — AMLODIPINE BESYLATE 10 MG PO TABS
10.0000 mg | ORAL_TABLET | Freq: Every evening | ORAL | Status: DC
  Administered 2018-12-05: 10 mg via ORAL
  Filled 2018-12-05: qty 1

## 2018-12-05 MED ORDER — GELATIN ABSORBABLE 12-7 MM EX MISC
CUTANEOUS | Status: AC
  Filled 2018-12-05: qty 1

## 2018-12-05 MED ORDER — SODIUM CHLORIDE 0.9 % IV SOLN
INTRAVENOUS | Status: AC | PRN
  Administered 2018-12-05: 10 mL/h via INTRAVENOUS

## 2018-12-05 MED ORDER — MIDAZOLAM HCL 2 MG/2ML IJ SOLN
INTRAMUSCULAR | Status: AC
  Filled 2018-12-05: qty 4

## 2018-12-05 MED ORDER — MIDAZOLAM HCL 2 MG/2ML IJ SOLN
INTRAMUSCULAR | Status: AC | PRN
  Administered 2018-12-05 (×2): 1 mg via INTRAVENOUS

## 2018-12-05 MED ORDER — HYDRALAZINE HCL 20 MG/ML IJ SOLN: 10.0000 mg | Freq: Three times a day (TID) | INTRAMUSCULAR | Status: DC | PRN

## 2018-12-05 MED ORDER — FENTANYL CITRATE (PF) 100 MCG/2ML IJ SOLN
INTRAMUSCULAR | Status: AC
  Filled 2018-12-05: qty 4

## 2018-12-05 MED ORDER — CLONIDINE HCL 0.1 MG/24HR TD PTWK: 0.1000 mg | MEDICATED_PATCH | TRANSDERMAL | Status: DC

## 2018-12-05 MED ORDER — LIDOCAINE HCL (PF) 1 % IJ SOLN
INTRAMUSCULAR | Status: AC | PRN
  Administered 2018-12-05: 10 mL

## 2018-12-05 MED ORDER — DIPHENHYDRAMINE HCL 25 MG PO CAPS
25.0000 mg | ORAL_CAPSULE | Freq: Three times a day (TID) | ORAL | Status: DC | PRN
  Administered 2018-12-05: 25 mg via ORAL
  Filled 2018-12-05: qty 1

## 2018-12-05 MED ORDER — IRBESARTAN 300 MG PO TABS
300.0000 mg | ORAL_TABLET | Freq: Every day | ORAL | Status: DC
  Administered 2018-12-05: 300 mg via ORAL
  Filled 2018-12-05: qty 1

## 2018-12-05 MED ORDER — HEPARIN SODIUM (PORCINE) 1000 UNIT/ML IJ SOLN
INTRAMUSCULAR | Status: AC
  Administered 2018-12-05: 3000 [IU]
  Filled 2018-12-05: qty 4

## 2018-12-05 MED ORDER — HEPARIN SODIUM (PORCINE) 1000 UNIT/ML IJ SOLN
INTRAMUSCULAR | Status: AC
  Administered 2018-12-05: 3.2 mL
  Filled 2018-12-05: qty 1

## 2018-12-05 MED ORDER — CEFAZOLIN SODIUM-DEXTROSE 1-4 GM/50ML-% IV SOLN
INTRAVENOUS | Status: AC | PRN
  Administered 2018-12-05: 2 g via INTRAVENOUS

## 2018-12-05 MED ORDER — DARBEPOETIN ALFA 60 MCG/0.3ML IJ SOSY
60.0000 ug | PREFILLED_SYRINGE | INTRAMUSCULAR | Status: DC
  Administered 2018-12-05: 60 ug via INTRAVENOUS
  Filled 2018-12-05: qty 0.3

## 2018-12-05 MED ORDER — LIDOCAINE HCL 1 % IJ SOLN
INTRAMUSCULAR | Status: AC
  Filled 2018-12-05: qty 20

## 2018-12-05 MED ORDER — FENTANYL CITRATE (PF) 100 MCG/2ML IJ SOLN
INTRAMUSCULAR | Status: AC | PRN
  Administered 2018-12-05 (×2): 50 ug via INTRAVENOUS

## 2018-12-05 NOTE — Procedures (Signed)
Interventional Radiology Procedure:   Indications: Needs hemodialysis  Procedure: Tunneled HD catheter placement  Findings: Right jugular Palindrome (19 cm tip to cuff), tip at SVC/RA junction  Complications: None     EBL: less than 20 ml  Plan: Catheter is ready to use.     Kelly Huff R. Anselm Pancoast, MD  Pager: [phone]

## 2018-12-05 NOTE — Progress Notes (Signed)
Renal Navigator received call from Dr. Arlyss Gandy that patient needs a temporary modality change from PD to HD and requests OP HD treatment at [facility] clinic.  Patient has been given a MWF schedule with a seat time of 12:25pm at [facility]. Renal Navigator notified Dr. Johnney Ou and met with patient at bedside to inform. Renal Navigator instructed patient to arrive at 11:30am on her first day of treatment, Wednesday, 12/07/18 to complete paperwork and at 12:00pm every day of treatment after that. Patient agreed.  Alphonzo Cruise, [place] Renal Navigator [phone]

## 2018-12-05 NOTE — Progress Notes (Addendum)
[facility] Progress Note   Subjective:   NPO for [facility] placement today, then HD.  No new complaints.  Ready for d/c.   Objective Vitals:   12/04/18 0606 12/04/18 0949 12/04/18 1635 12/04/18 2006  BP: 116/67 138/90 (!) 150/81 (!) 159/86  Pulse: 87 86 84 86  Resp: 16 18 18 16   Temp: 99.3 F (37.4 C) 98.8 F (37.1 C) 98.9 F (37.2 C) 98.9 F (37.2 C)  TempSrc: Oral Oral Oral Oral  SpO2: 97% 99% 99% 99%   Physical Exam General: appears well Heart: RRR Lungs: clear Abdomen: soft, R mid quadrant PD cath dressing c/d/i Extremities: no edema Dialysis Access: awaiting HD cath placement  Additional Objective Labs: Basic Metabolic Panel: Recent Labs  Lab 12/03/18 2235 12/04/18 0555 12/05/18 0409  NA  --  138 136  K  --  4.5 4.9  CL  --  101 100  CO2  --  18* 19*  GLUCOSE  --  120* 160*  BUN  --  84* 85*  CREATININE 15.58* 15.96* 16.15*  CALCIUM  --  6.4* 6.7*  PHOS  --   --  7.4*   Liver Function Tests: Recent Labs  Lab 12/04/18 0555 12/05/18 0409  AST 12*  --   ALT 5  --   ALKPHOS 77  --   BILITOT 0.5  --   PROT 5.3*  --   ALBUMIN 2.6* 2.7*   No results for input(s): LIPASE, AMYLASE in the last 168 hours. CBC: Recent Labs  Lab 12/03/18 2235 12/04/18 0555 12/05/18 0409  WBC 5.2 4.5 4.2  NEUTROABS  --   --  2.6  HGB 8.0* 7.5* 7.9*  HCT 24.1* 23.1* 23.8*  MCV 82.5 83.7 83.5  PLT 282 282 278   Blood Culture    Component Value Date/Time   SDES CSF 09/04/2018 1119   SPECREQUEST NONE 09/04/2018 1119   CULT  09/04/2018 1119    NO GROWTH 3 DAYS Performed at [facility], [place] [address] [place], [place] [address]    REPTSTATUS 09/07/2018 FINAL 09/04/2018 1119    Cardiac Enzymes: No results for input(s): CKTOTAL, CKMB, CKMBINDEX, TROPONINI in the last 168 hours. CBG: Recent Labs  Lab 12/04/18 1120 12/04/18 1630 12/04/18 1715 12/04/18 2122 12/05/18 0703  GLUCAP 138* 51* 82 163* 144*   Iron Studies: No results for  input(s): IRON, TIBC, TRANSFERRIN, FERRITIN in the last 72 hours. @lablastinr3 @ Studies/Results: No results found. Medications: . insulin pump 4.9 mL/hr at 12/04/18 0931   . B-complex with vitamin C  1 tablet Oral Daily   And  . folic acid  1 mg Oral Daily  . calcium acetate  1,334 mg Oral TID WC  . Chlorhexidine Gluconate Cloth  6 each Topical Q0600  . docusate sodium  100 mg Oral BID  . gentamicin cream  1 application Topical Daily  . metoprolol succinate  100 mg Oral BID  . paricalcitol  1 mcg Oral Daily    Dialysis Orders: [facility] CCPD 7X Week, EDW 72 (kg) Ca 2.5 (mEq/L) Mg 0.5 (mEq/L) Dextrose 1.5; 2.5; 4.25 %, # Exchanges 5, fill vol 2500 mL, Dwell Time 1 hrs 30 min, last fill vol 0 mL, Day Exchanges 0, daytime fill vol 2000 mL, Day Dwell Time 5 hrs 0 min   Assessment/Plan: 1.  PD catheter malfunction: PD catheter occluded, underwent revision by Dr. Raul Del on 12/01/2018 but then developed subcutaneous edema and fluid would not drain. Last full PD was 12/02/2018. Will  require HD temporarily until she is able to resume PD again- IR consulted with plans to place [facility] today. Will follow up with Dr. Jackelyn Knife.   2.  ESRD:  CCPD since 08/2018, last full PD on 12/02/2018. Cr is elevated and patient is significantly above her EDW, howevr she has no uremic or respiratory symptoms. IR for [facility] placement today and start HD once catheter is in place.D/w CLIP manager pt desires placement at [facility]. Check hepatitis serologies today.  Hopefully can discharge today after HD.  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  3.  Hypertension/volume: BP overall controlled. Lower volume gradually once HD is initiated. 1200 mL fluid restriction.  4.  Anemia: Hgb 7.5 admit > 7.9 this AM. Asymptomatic, no bleeding reported. Initiate aranesp 60 qMon, check iron level (added on to this AMs labs).  Transfuse PRN 5.   Metabolic bone disease: Corrected calcium 7.5. Reports she frequently misses doses of Phoslo. Calcium has been low recently on outpatient labs. Resume phoslo, follow calcium. High calcium bath on HD.  6.  Nutrition:  Renal diet with fluid restrictions.  7. Dispo:  OK for discharge Monday after HD - confirmed HD chair at [facility] starting 12/07/18.  Jannifer Hick MD 12/05/2018, 8:56 AM  [facility] Pager: [phone]

## 2018-12-05 NOTE — Progress Notes (Signed)
MD Horris Latino notifie pt's BP 189/100, also notified that this RN called HD to notify them of pt's BP and was advised to hold all BP medications since it will be pt's first HD tx today. Also notified MD that pt is willing to DC after HD.  Paulla Fore, RN

## 2018-12-05 NOTE — Progress Notes (Signed)
Inpatient Diabetes Program Recommendations  AACE/ADA: New Consensus Statement on Inpatient Glycemic Control (2015)  Target Ranges:  Prepandial:   less than 140 mg/dL      Peak postprandial:   less than 180 mg/dL (1-2 hours)      Critically ill patients:  140 - 180 mg/dL   Lab Results  Component Value Date   GLUCAP 144 (H) 12/05/2018   HGBA1C 8.7 (H) 12/03/2018    Review of Glycemic Control   Diabetes history: DM 1, Sees Dr. Buddy Duty, Endocrinologist Outpatient Diabetes medications: Omnipod insulin pump Current orders for Inpatient glycemic control: Insulin pump order set  Patient turned insulin pump off last night due to hypoglycemia. Patient states MD and RN knows. Will restart insulin pump when she is eating after her procedure. Procedure scheduled for now.  Glucose trends look well in the 140's range.  Insulin pump settings  Basal: 1A-6A  0.75 units/hour 6A-9P  1 unit/hour 9P-12A 0.95 units/hour  A total of 21.6 units of basal insulin in a 24 hour period  Carb Coverage: 1 units for every 6 grams of carbs  Sensitivity:  1 units drops glucose 65 points  Last A1c patient remembers from Dr. Buddy Duty was around 7%. Discussed current A1c value of 8.7%.  Thanks,  Tama Headings RN, MSN, BC-ADM Inpatient Diabetes Coordinator Team Pager [phone] (8a-5p)

## 2018-12-05 NOTE — Discharge Summary (Addendum)
Discharge Summary  Kelly Huff [id number] DOB: March 28, 1986  PCP: Rubie Maid, MD  Admit date: 12/03/2018 Discharge date: 12/05/2018  Time spent: 40 mins  Recommendations for Outpatient Follow-up:  1. PCP in 1 week 2. 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    Discharge Condition: Stable  Diet recommendation: Renal diet  Vitals:   12/05/18 1352 12/05/18 1417  BP: (!) 179/94 (!) 186/100  Pulse: 80 83  Resp: (!) 9   Temp:  98.2 F (36.8 C)  SpO2: 99% 100%    History of present illness:  Kelly E Daviesis a 33 y.o.femalewith medical history significant ofend-stage renal disease on peritoneal dialysis, diabetes, hypertension who was sent over from [facility] [place] secondary to complications of her peritoneal dialysis catheter.   On 8/2 patient noted PD was not draining, had a revision on 12/01/2018, which worked for the day and on 8/7 still had the same problem with drainage.  Patient opted to be transferred over to [facility] for further management.  Nephrology consulted.  In the ED, patient remained stable, creatinine noted to be 15.58.  Patient admitted for further management.    Today, patient denies any new complaints, denies any uremic symptoms, denies any chest pain, shortness of breath, fever/chills, abdominal pain, nausea/vomiting, diarrhea.  Patient stable to be discharged after her hemodialysis session this evening pending clinical status.  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 (H[facility])   ESRD on dialysis [facility])   Peritoneal dialysis catheter dysfunction [facility])   Peritoneal dialysis catheter malfunction Afebrile, no leukocytosis Nephrology on board, will require HD temporarily IR consulted for [facility],  placed on 12/05/2018 CT abdomen/pelvis shows subcutaneous edema on the left around 1 of the laparoscopic port sites, but no evidence of infection  ESRD/hypocalcemia Denies any uremic symptoms Nephrology on board, will require HD temporarily Patient has been set up for HD on a M/W/F schedule  Diabetes mellitus type 1 A1c on 12/03/2018 was 8.7 Patient with insulin pump, continue  Hypertension BP stable Continue home meds  Anemia of chronic kidney disease Hemoglobin has been noted to be downtrending since 10/2018 No signs of obvious bleeding PCP/nephrology to follow-up with repeat CBC       Malnutrition Type:      Malnutrition Characteristics:      Nutrition Interventions:      Estimated body mass index is 28.44 kg/m as calculated from the following:   Height as of 09/26/18: 5\' 7"  (1.702 m).   Weight as of 11/21/18: 82.4 kg.    Procedures:  TDC placement on 12/05/2018  Consultations: Nephrology IR  Discharge Exam: BP (!) 186/100 (BP Location: Left Arm)    Pulse 83    Temp 98.2 F (36.8 C) (Oral)    Resp (!) 9    SpO2 100%   General: NAD Cardiovascular: S1, S2 present Respiratory: CTA B  Discharge Instructions You were cared for by a hospitalist during your hospital stay. If you have any questions about your discharge medications or the care you received while you were in the hospital after you  are discharged, you can call the unit and asked to speak with the hospitalist on call if the hospitalist that took care of you is not available. Once you are discharged, your primary care physician will handle any further medical issues. Please note that NO REFILLS for any discharge medications will be authorized once you are discharged, as it is imperative  that you return to your primary care physician (or establish a relationship with a primary care physician if you do not have one) for your aftercare needs so that they can reassess your need for medications and monitor your lab values.   Allergies as of 12/05/2018      Reactions   Morphine Itching, Other (See Comments)   Hallucination   Sulfamethoxazole-trimethoprim Hives   Trulicity [dulaglutide] Other (See Comments)   She is DM Type 1. Was prescribed but damaged her kidneys      Medication List    STOP taking these medications   ondansetron 4 MG disintegrating tablet Commonly known as: Zofran ODT     TAKE these medications   amLODipine 10 MG tablet Commonly known as: NORVASC Take 10 mg by mouth every evening.   calcium acetate 667 MG tablet Commonly known as: PHOSLO Take 1,334 mg by mouth 3 (three) times daily with meals.   clobetasol ointment 0.05 % Commonly known as: TEMOVATE Apply 1 application topically 2 (two) times daily as needed for itching.   cloNIDine 0.1 mg/24hr patch Commonly known as: CATAPRES - Dosed in mg/24 hr Place 0.1 mg onto the skin every Sunday.   cyclobenzaprine 10 MG tablet Commonly known as: FLEXERIL Take 10 mg by mouth at bedtime as needed for muscle spasms.   docusate sodium 100 MG capsule Commonly known as: COLACE Take 100 mg by mouth 2 (two) times daily.   gentamicin cream 0.1 % Commonly known as: GARAMYCIN Apply 1 application topically See admin instructions. Apply small amount to catheter exit site once daily   HYDROcodone-acetaminophen 5-325 MG tablet Commonly known as: NORCO/VICODIN Take 1 tablet by mouth every 4 (four) hours as needed (pain).   insulin pump Soln Inject into the skin continuous. Novolog   lactulose 10 GM/15ML solution Commonly known as: CHRONULAC Take 20 g by mouth at bedtime as needed (constipation).   metoprolol succinate 100 MG 24 hr tablet Commonly known as: TOPROL-XL Take 100 mg by mouth 2 (two) times  daily.   olmesartan 40 MG tablet Commonly known as: BENICAR Take 40 mg by mouth every morning.   paricalcitol 1 MCG capsule Commonly known as: ZEMPLAR Take 1 mcg by mouth daily.   [place] Caps 1 MG Caps Take 1 mg by mouth daily.      Allergies  Allergen Reactions   Morphine Itching and Other (See Comments)    Hallucination   Sulfamethoxazole-Trimethoprim Hives   Trulicity [Dulaglutide] Other (See Comments)    She is DM Type 1. Was prescribed but damaged her kidneys      The results of significant diagnostics from this hospitalization (including imaging, microbiology, ancillary and laboratory) are listed below for reference.    Significant Diagnostic Studies: Ct Abdomen Pelvis Wo Contrast  Result Date: 11/25/2018 CLINICAL DATA:  Abdominal pain, constipation, incomplete dialysis EXAM: CT ABDOMEN AND PELVIS WITHOUT CONTRAST TECHNIQUE: Multidetector CT imaging of the abdomen and pelvis was performed following the standard protocol without IV contrast. COMPARISON:  Abdominal radiograph, 11/24/2018, CT abdomen pelvis, 09/02/2018 FINDINGS: Lower chest: No acute abnormality. Hepatobiliary: No solid liver abnormality is seen. No gallstones, gallbladder wall  thickening, or biliary dilatation. Pancreas: Unremarkable. No pancreatic ductal dilatation or surrounding inflammatory changes. Spleen: Normal in size without significant abnormality. Adrenals/Urinary Tract: Adrenal glands are unremarkable. Kidneys are normal, without renal calculi, solid lesion, or hydronephrosis. Thickening of the urinary bladder. Stomach/Bowel: Stomach is within normal limits. Appendix appears normal. No evidence of bowel wall thickening, distention, or inflammatory changes. Scattered stool in the colon. Vascular/Lymphatic: No significant vascular findings are present. No enlarged abdominal or pelvic lymph nodes. Reproductive: No mass or other significant abnormality. Other: No abdominal wall hernia or abnormality. No  abdominopelvic ascites. Peritoneal dialysis catheter is positioned with tip in the central low abdomen. Musculoskeletal: No acute or significant osseous findings. IMPRESSION: 1.  No large burden of stool in the colon. 2. Peritoneal dialysis catheter is positioned with tip in the central low abdomen. 3. Thickening of the urinary bladder, nonspecific. Correlate with urinalysis. Electronically Signed   By: Eddie Candle M.D.   On: 11/25/2018 13:20   Dg Abd 1 View  Result Date: 11/24/2018 CLINICAL DATA:  Peritoneal dialysis catheter.  Constipation. EXAM: ABDOMEN - 1 VIEW COMPARISON:  CT 09/02/2018 FINDINGS: Interval placement of catheter within the right hemiabdomen with distal tip coiled in left hemipelvis. No evidence of catheter kinking or discontinuity. Mild colonic stool burden. No gross free intraperitoneal air. Osseous structures are intact and unremarkable. IMPRESSION: Mild colonic stool burden. Electronically Signed   By: Davina Poke M.D.   On: 11/24/2018 10:20   Ir Fluoro Guide Cv Line Right  Result Date: 12/05/2018 INDICATION: 33 year old with end-stage disease and issues with the peritoneal dialysis catheter. Request for hemodialysis catheter. EXAM: FLUOROSCOPIC AND ULTRASOUND GUIDED PLACEMENT OF A TUNNELED DIALYSIS CATHETER Physician: Stephan Minister. Anselm Pancoast, MD MEDICATIONS: Ancef 2 g; The antibiotic was administered within an appropriate time interval prior to skin puncture. ANESTHESIA/SEDATION: Versed 2.0 mg IV; Fentanyl 100 mcg IV; Moderate Sedation Time:  22 minutes The patient was continuously monitored during the procedure by the interventional radiology nurse under my direct supervision. FLUOROSCOPY TIME:  Fluoroscopy Time: 30 seconds, 2 mGy COMPLICATIONS: None immediate. PROCEDURE: Informed consent was obtained for placement of a tunneled dialysis catheter. The patient was placed supine on the interventional table. Ultrasound confirmed a patent right internal jugular vein. Ultrasound images were  obtained for documentation. The right side of the neck and chest was prepped and draped in a sterile fashion. The right neck was anesthetized with 1% lidocaine. Maximal barrier sterile technique was utilized including caps, mask, sterile gowns, sterile gloves, sterile drape, hand hygiene and skin antiseptic. A small incision was made with #11 blade scalpel. A 21 gauge needle directed into the right internal jugular vein with ultrasound guidance. A micropuncture dilator set was placed. A 19 cm tip to cuff Palindrome catheter was selected. The skin below the right clavicle was anesthetized and a small incision was made with an #11 blade scalpel. A subcutaneous tunnel was formed to the vein dermatotomy site. The catheter was brought through the tunnel. The vein dermatotomy site was dilated to accommodate a peel-away sheath. The catheter was placed through the peel-away sheath and directed into the central venous structures. The tip of the catheter was placed in the SVC and right atrium junction with fluoroscopy. Fluoroscopic images were obtained for documentation. Both lumens were found to aspirate and flush well. The proper amount of heparin was flushed in both lumens. The vein dermatotomy site was closed using a single layer of absorbable suture and Dermabond. Gel-Foam was placed in subcutaneous tract. The catheter was  secured to the skin using Prolene suture. IMPRESSION: Successful placement of a right jugular tunneled dialysis catheter using ultrasound and fluoroscopic guidance. Electronically Signed   By: Markus Daft M.D.   On: 12/05/2018 14:36   Ir US Guide Vasc Access Right  Result Date: 12/05/2018 INDICATION: 33 year old with end-stage disease and issues with the peritoneal dialysis catheter. Request for hemodialysis catheter. EXAM: FLUOROSCOPIC AND ULTRASOUND GUIDED PLACEMENT OF A TUNNELED DIALYSIS CATHETER Physician: Stephan Minister. Anselm Pancoast, MD MEDICATIONS: Ancef 2 g; The antibiotic was administered within an  appropriate time interval prior to skin puncture. ANESTHESIA/SEDATION: Versed 2.0 mg IV; Fentanyl 100 mcg IV; Moderate Sedation Time:  22 minutes The patient was continuously monitored during the procedure by the interventional radiology nurse under my direct supervision. FLUOROSCOPY TIME:  Fluoroscopy Time: 30 seconds, 2 mGy COMPLICATIONS: None immediate. PROCEDURE: Informed consent was obtained for placement of a tunneled dialysis catheter. The patient was placed supine on the interventional table. Ultrasound confirmed a patent right internal jugular vein. Ultrasound images were obtained for documentation. The right side of the neck and chest was prepped and draped in a sterile fashion. The right neck was anesthetized with 1% lidocaine. Maximal barrier sterile technique was utilized including caps, mask, sterile gowns, sterile gloves, sterile drape, hand hygiene and skin antiseptic. A small incision was made with #11 blade scalpel. A 21 gauge needle directed into the right internal jugular vein with ultrasound guidance. A micropuncture dilator set was placed. A 19 cm tip to cuff Palindrome catheter was selected. The skin below the right clavicle was anesthetized and a small incision was made with an #11 blade scalpel. A subcutaneous tunnel was formed to the vein dermatotomy site. The catheter was brought through the tunnel. The vein dermatotomy site was dilated to accommodate a peel-away sheath. The catheter was placed through the peel-away sheath and directed into the central venous structures. The tip of the catheter was placed in the SVC and right atrium junction with fluoroscopy. Fluoroscopic images were obtained for documentation. Both lumens were found to aspirate and flush well. The proper amount of heparin was flushed in both lumens. The vein dermatotomy site was closed using a single layer of absorbable suture and Dermabond. Gel-Foam was placed in subcutaneous tract. The catheter was secured to the skin  using Prolene suture. IMPRESSION: Successful placement of a right jugular tunneled dialysis catheter using ultrasound and fluoroscopic guidance. Electronically Signed   By: Markus Daft M.D.   On: 12/05/2018 14:36    Microbiology: No results found for this or any previous visit (from the past 240 hour(s)).   Labs: Basic Metabolic Panel: Recent Labs  Lab 12/03/18 2235 12/04/18 0555 12/05/18 0409  NA  --  138 136  K  --  4.5 4.9  CL  --  101 100  CO2  --  18* 19*  GLUCOSE  --  120* 160*  BUN  --  84* 85*  CREATININE 15.58* 15.96* 16.15*  CALCIUM  --  6.4* 6.7*  PHOS  --   --  7.4*   Liver Function Tests: Recent Labs  Lab 12/04/18 0555 12/05/18 0409  AST 12*  --   ALT 5  --   ALKPHOS 77  --   BILITOT 0.5  --   PROT 5.3*  --   ALBUMIN 2.6* 2.7*   No results for input(s): LIPASE, AMYLASE in the last 168 hours. No results for input(s): AMMONIA in the last 168 hours. CBC: Recent Labs  Lab 12/03/18 2235 12/04/18 0555  12/05/18 0409  WBC 5.2 4.5 4.2  NEUTROABS  --   --  2.6  HGB 8.0* 7.5* 7.9*  HCT 24.1* 23.1* 23.8*  MCV 82.5 83.7 83.5  PLT 282 282 278   Cardiac Enzymes: No results for input(s): CKTOTAL, CKMB, CKMBINDEX, TROPONINI in the last 168 hours. BNP: BNP (last 3 results) No results for input(s): BNP in the last 8760 hours.  ProBNP (last 3 results) No results for input(s): PROBNP in the last 8760 hours.  CBG: Recent Labs  Lab 12/04/18 1630 12/04/18 1715 12/04/18 2122 12/05/18 0703 12/05/18 1132  GLUCAP 51* 82 163* 144* 183*       Signed:  Alma Friendly, MD [facility] 12/05/2018, 4:14 PM

## 2018-12-06 LAB — ABO/RH: ABO/RH(D): B POS

## 2018-12-06 LAB — HEPATITIS B SURFACE ANTIBODY,QUALITATIVE: Hep B S Ab: REACTIVE

## 2018-12-06 LAB — HEPATITIS B CORE ANTIBODY, TOTAL: Hep B Core Total Ab: NEGATIVE

## 2018-12-06 LAB — GLUCOSE, CAPILLARY: Glucose-Capillary: 116 mg/dL — ABNORMAL HIGH (ref 70–99)

## 2018-12-06 LAB — HEPATITIS B SURFACE ANTIGEN: Hepatitis B Surface Ag: NEGATIVE

## 2018-12-06 NOTE — Progress Notes (Signed)
DISCHARGE NOTE HOME Kelly Huff to be discharged home per MD order. Discussed prescriptions and follow up appointments with the patient. Prescriptions given to patient; medication list explained in detail. Patient verbalized understanding.  Skin clean, dry and intact without evidence of skin break down, no evidence of skin tears noted. IV catheter discontinued intact. Site without signs and symptoms of complications. Dressing and pressure applied. Pt denies pain at the site currently. No complaints noted.  Patient free of wounds.   An After Visit Summary (AVS) was printed and given to the patient. Patient escorted via wheelchair, and discharged home via cab.  Rushie Goltz, RN

## 2018-12-13 ENCOUNTER — Encounter (HOSPITAL_COMMUNITY): Payer: Self-pay | Admitting: Interventional Radiology

## 2018-12-13 ENCOUNTER — Observation Stay (HOSPITAL_COMMUNITY)
Admission: EM | Admit: 2018-12-13 | Discharge: 2018-12-14 | Disposition: A | Discharge status: Home / Self Care | Payer: Medicare Other | Attending: Family Medicine | Admitting: Family Medicine

## 2018-12-13 ENCOUNTER — Emergency Department (HOSPITAL_COMMUNITY): Payer: Medicare Other

## 2018-12-13 ENCOUNTER — Other Ambulatory Visit: Payer: Self-pay

## 2018-12-13 DIAGNOSIS — E875 Hyperkalemia: Secondary | ICD-10-CM | POA: Diagnosis present

## 2018-12-13 DIAGNOSIS — N186 End stage renal disease: Secondary | ICD-10-CM | POA: Diagnosis not present

## 2018-12-13 DIAGNOSIS — E1022 Type 1 diabetes mellitus with diabetic chronic kidney disease: Secondary | ICD-10-CM | POA: Insufficient documentation

## 2018-12-13 DIAGNOSIS — Z79899 Other long term (current) drug therapy: Secondary | ICD-10-CM | POA: Diagnosis not present

## 2018-12-13 DIAGNOSIS — I120 Hypertensive chronic kidney disease with stage 5 chronic kidney disease or end stage renal disease: Secondary | ICD-10-CM | POA: Insufficient documentation

## 2018-12-13 DIAGNOSIS — Z885 Allergy status to narcotic agent status: Secondary | ICD-10-CM | POA: Diagnosis not present

## 2018-12-13 DIAGNOSIS — Z9641 Presence of insulin pump (external) (internal): Secondary | ICD-10-CM | POA: Diagnosis not present

## 2018-12-13 DIAGNOSIS — T859XXA Unspecified complication of internal prosthetic device, implant and graft, initial encounter: Secondary | ICD-10-CM

## 2018-12-13 DIAGNOSIS — E8889 Other specified metabolic disorders: Secondary | ICD-10-CM | POA: Diagnosis not present

## 2018-12-13 DIAGNOSIS — T82520A Displacement of surgically created arteriovenous fistula, initial encounter: Secondary | ICD-10-CM | POA: Diagnosis not present

## 2018-12-13 DIAGNOSIS — Z20828 Contact with and (suspected) exposure to other viral communicable diseases: Secondary | ICD-10-CM | POA: Diagnosis not present

## 2018-12-13 DIAGNOSIS — Z888 Allergy status to other drugs, medicaments and biological substances status: Secondary | ICD-10-CM | POA: Insufficient documentation

## 2018-12-13 DIAGNOSIS — Z881 Allergy status to other antibiotic agents status: Secondary | ICD-10-CM | POA: Insufficient documentation

## 2018-12-13 DIAGNOSIS — T85611A Breakdown (mechanical) of intraperitoneal dialysis catheter, initial encounter: Secondary | ICD-10-CM

## 2018-12-13 DIAGNOSIS — Z794 Long term (current) use of insulin: Secondary | ICD-10-CM | POA: Insufficient documentation

## 2018-12-13 DIAGNOSIS — Z992 Dependence on renal dialysis: Secondary | ICD-10-CM | POA: Diagnosis not present

## 2018-12-13 DIAGNOSIS — E103219 Type 1 diabetes mellitus with mild nonproliferative diabetic retinopathy with macular edema, unspecified eye: Secondary | ICD-10-CM | POA: Insufficient documentation

## 2018-12-13 DIAGNOSIS — W500XXA Accidental hit or strike by another person, initial encounter: Secondary | ICD-10-CM | POA: Insufficient documentation

## 2018-12-13 HISTORY — PX: IR FLUORO GUIDE CV LINE RIGHT: IMG2283

## 2018-12-13 HISTORY — PX: IR US GUIDE VASC ACCESS RIGHT: IMG2390

## 2018-12-13 LAB — RENAL FUNCTION PANEL
Albumin: 3.4 g/dL — ABNORMAL LOW (ref 3.5–5.0)
Anion gap: 16 — ABNORMAL HIGH (ref 5–15)
BUN: 71 mg/dL — ABNORMAL HIGH (ref 6–20)
CO2: 21 mmol/L — ABNORMAL LOW (ref 22–32)
Calcium: 8.7 mg/dL — ABNORMAL LOW (ref 8.9–10.3)
Chloride: 97 mmol/L — ABNORMAL LOW (ref 98–111)
Creatinine, Ser: 13.64 mg/dL — ABNORMAL HIGH (ref 0.44–1.00)
GFR calc Af Amer: 4 mL/min — ABNORMAL LOW (ref >60)
GFR calc non Af Amer: 3 mL/min — ABNORMAL LOW (ref >60)
Glucose, Bld: 177 mg/dL — ABNORMAL HIGH (ref 70–99)
Phosphorus: 6.8 mg/dL — ABNORMAL HIGH (ref 2.5–4.6)
Potassium: 4.9 mmol/L (ref 3.5–5.1)
Sodium: 134 mmol/L — ABNORMAL LOW (ref 135–145)

## 2018-12-13 LAB — BASIC METABOLIC PANEL
Anion gap: 17 — ABNORMAL HIGH (ref 5–15)
BUN: 70 mg/dL — ABNORMAL HIGH (ref 6–20)
CO2: 20 mmol/L — ABNORMAL LOW (ref 22–32)
Calcium: 8.7 mg/dL — ABNORMAL LOW (ref 8.9–10.3)
Chloride: 94 mmol/L — ABNORMAL LOW (ref 98–111)
Creatinine, Ser: 14.04 mg/dL — ABNORMAL HIGH (ref 0.44–1.00)
GFR calc Af Amer: 3 mL/min — ABNORMAL LOW (ref >60)
GFR calc non Af Amer: 3 mL/min — ABNORMAL LOW (ref >60)
Glucose, Bld: 369 mg/dL — ABNORMAL HIGH (ref 70–99)
Potassium: 6.7 mmol/L (ref 3.5–5.1)
Sodium: 131 mmol/L — ABNORMAL LOW (ref 135–145)

## 2018-12-13 LAB — CBG MONITORING, ED: Glucose-Capillary: 77 mg/dL (ref 70–99)

## 2018-12-13 LAB — CBC
HCT: 29.5 % — ABNORMAL LOW (ref 36.0–46.0)
Hemoglobin: 9.4 g/dL — ABNORMAL LOW (ref 12.0–15.0)
MCH: 27.3 pg (ref 26.0–34.0)
MCHC: 31.9 g/dL (ref 30.0–36.0)
MCV: 85.8 fL (ref 80.0–100.0)
Platelets: 283 10*3/uL (ref 150–400)
RBC: 3.44 MIL/uL — ABNORMAL LOW (ref 3.87–5.11)
RDW: 15.9 % — ABNORMAL HIGH (ref 11.5–15.5)
WBC: 4.8 10*3/uL (ref 4.0–10.5)
nRBC: 0 % (ref 0.0–0.2)

## 2018-12-13 LAB — GLUCOSE, CAPILLARY: Glucose-Capillary: 197 mg/dL — ABNORMAL HIGH (ref 70–99)

## 2018-12-13 LAB — SARS CORONAVIRUS 2 BY RT PCR (HOSPITAL ORDER, PERFORMED IN ~~LOC~~ HOSPITAL LAB): SARS Coronavirus 2: NEGATIVE

## 2018-12-13 MED ORDER — INSULIN PUMP
1.0000 | SUBCUTANEOUS | Status: DC
  Administered 2018-12-14: 1 via SUBCUTANEOUS
  Filled 2018-12-13: qty 1

## 2018-12-13 MED ORDER — IRBESARTAN 75 MG PO TABS: 37.5000 mg | ORAL_TABLET | Freq: Every day | ORAL | Status: DC

## 2018-12-13 MED ORDER — DOCUSATE SODIUM 100 MG PO CAPS
100.0000 mg | ORAL_CAPSULE | Freq: Two times a day (BID) | ORAL | Status: DC
  Filled 2018-12-13: qty 1

## 2018-12-13 MED ORDER — GELATIN ABSORBABLE 12-7 MM EX MISC
CUTANEOUS | Status: AC | PRN
  Administered 2018-12-13: 1 via TOPICAL

## 2018-12-13 MED ORDER — CALCIUM ACETATE (PHOS BINDER) 667 MG PO CAPS: 1334.0000 mg | ORAL_CAPSULE | Freq: Three times a day (TID) | ORAL | Status: DC

## 2018-12-13 MED ORDER — GELATIN ABSORBABLE 12-7 MM EX MISC
CUTANEOUS | Status: AC
  Filled 2018-12-13: qty 1

## 2018-12-13 MED ORDER — RENA-VITE PO TABS: 1.0000 | ORAL_TABLET | Freq: Every day | ORAL | Status: DC

## 2018-12-13 MED ORDER — METOPROLOL SUCCINATE ER 100 MG PO TB24: 100.0000 mg | ORAL_TABLET | Freq: Two times a day (BID) | ORAL | Status: DC

## 2018-12-13 MED ORDER — MIDAZOLAM HCL 2 MG/2ML IJ SOLN
INTRAMUSCULAR | Status: AC
  Filled 2018-12-13: qty 4

## 2018-12-13 MED ORDER — ONDANSETRON HCL 4 MG/2ML IJ SOLN
4.0000 mg | Freq: Four times a day (QID) | INTRAMUSCULAR | Status: DC | PRN
  Administered 2018-12-13: 4 mg via INTRAVENOUS
  Filled 2018-12-13: qty 2

## 2018-12-13 MED ORDER — INSULIN ASPART 100 UNIT/ML ~~LOC~~ SOLN
10.0000 [IU] | Freq: Once | SUBCUTANEOUS | Status: AC
  Administered 2018-12-13: 16:00:00 10 [IU] via INTRAVENOUS

## 2018-12-13 MED ORDER — DEXTROSE 50 % IV SOLN
INTRAVENOUS | Status: AC
  Filled 2018-12-13: qty 50

## 2018-12-13 MED ORDER — ACETAMINOPHEN 500 MG PO TABS
ORAL_TABLET | ORAL | Status: AC
  Filled 2018-12-13: qty 1

## 2018-12-13 MED ORDER — LIDOCAINE HCL 1 % IJ SOLN
INTRAMUSCULAR | Status: AC
  Filled 2018-12-13: qty 20

## 2018-12-13 MED ORDER — HEPARIN SODIUM (PORCINE) 1000 UNIT/ML IJ SOLN
INTRAMUSCULAR | Status: AC
  Administered 2018-12-13: 4000 [IU]
  Filled 2018-12-13: qty 4

## 2018-12-13 MED ORDER — HEPARIN SODIUM (PORCINE) 1000 UNIT/ML IJ SOLN
INTRAMUSCULAR | Status: AC | PRN
  Administered 2018-12-13: 3200 [IU] via INTRAVENOUS

## 2018-12-13 MED ORDER — ALBUTEROL SULFATE (2.5 MG/3ML) 0.083% IN NEBU
2.5000 mg | INHALATION_SOLUTION | Freq: Four times a day (QID) | RESPIRATORY_TRACT | Status: DC
  Administered 2018-12-13: 2.5 mg via RESPIRATORY_TRACT
  Filled 2018-12-13: qty 3

## 2018-12-13 MED ORDER — FENTANYL CITRATE (PF) 100 MCG/2ML IJ SOLN
INTRAMUSCULAR | Status: AC
  Filled 2018-12-13: qty 4

## 2018-12-13 MED ORDER — AMLODIPINE BESYLATE 10 MG PO TABS
10.0000 mg | ORAL_TABLET | Freq: Every evening | ORAL | Status: DC
  Administered 2018-12-14: 10 mg via ORAL
  Filled 2018-12-13: qty 1

## 2018-12-13 MED ORDER — ONDANSETRON HCL 4 MG/2ML IJ SOLN
4.0000 mg | Freq: Once | INTRAMUSCULAR | Status: AC
  Administered 2018-12-13: 18:00:00 4 mg via INTRAVENOUS
  Filled 2018-12-13: qty 2

## 2018-12-13 MED ORDER — PARICALCITOL 1 MCG PO CAPS
1.0000 ug | ORAL_CAPSULE | Freq: Every day | ORAL | Status: DC
  Filled 2018-12-13: qty 1

## 2018-12-13 MED ORDER — FENTANYL CITRATE (PF) 100 MCG/2ML IJ SOLN
INTRAMUSCULAR | Status: AC | PRN
  Administered 2018-12-13: 50 ug via INTRAVENOUS

## 2018-12-13 MED ORDER — LACTULOSE 10 GM/15ML PO SOLN: 20.0000 g | Freq: Every evening | ORAL | Status: DC | PRN

## 2018-12-13 MED ORDER — MIDAZOLAM HCL 2 MG/2ML IJ SOLN
INTRAMUSCULAR | Status: AC | PRN
  Administered 2018-12-13: 1 mg via INTRAVENOUS

## 2018-12-13 MED ORDER — ONDANSETRON HCL 4 MG/2ML IJ SOLN
4.0000 mg | Freq: Once | INTRAMUSCULAR | Status: AC
  Administered 2018-12-13: 15:00:00 4 mg via INTRAVENOUS
  Filled 2018-12-13: qty 2

## 2018-12-13 MED ORDER — SODIUM CHLORIDE 0.9 % IV SOLN
1.0000 g | Freq: Once | INTRAVENOUS | Status: AC
  Administered 2018-12-13: 16:00:00 1 g via INTRAVENOUS
  Filled 2018-12-13: qty 10

## 2018-12-13 MED ORDER — CHLORHEXIDINE GLUCONATE 4 % EX LIQD
CUTANEOUS | Status: AC
  Filled 2018-12-13: qty 15

## 2018-12-13 MED ORDER — LIDOCAINE HCL 1 % IJ SOLN
INTRAMUSCULAR | Status: AC | PRN
  Administered 2018-12-13: 5 mL

## 2018-12-13 MED ORDER — DEXTROSE 50 % IV SOLN
50.0000 mL | Freq: Once | INTRAVENOUS | Status: AC
  Administered 2018-12-13: 16:00:00 50 mL via INTRAVENOUS
  Filled 2018-12-13: qty 50

## 2018-12-13 MED ORDER — HEPARIN SODIUM (PORCINE) 5000 UNIT/ML IJ SOLN: 5000.0000 [IU] | Freq: Two times a day (BID) | INTRAMUSCULAR | Status: DC

## 2018-12-13 MED ORDER — CLONIDINE HCL 0.1 MG/24HR TD PTWK: 0.1000 mg | MEDICATED_PATCH | TRANSDERMAL | Status: DC

## 2018-12-13 MED ORDER — ACETAMINOPHEN 500 MG PO TABS
500.0000 mg | ORAL_TABLET | Freq: Once | ORAL | Status: AC
  Administered 2018-12-13: 500 mg via ORAL

## 2018-12-13 MED ORDER — CEFAZOLIN SODIUM-DEXTROSE 2-4 GM/100ML-% IV SOLN
INTRAVENOUS | Status: AC
  Filled 2018-12-13: qty 100

## 2018-12-13 MED ORDER — CEFAZOLIN SODIUM-DEXTROSE 2-4 GM/100ML-% IV SOLN
2.0000 g | Freq: Once | INTRAVENOUS | Status: AC
  Administered 2018-12-13: 17:00:00 2 g via INTRAVENOUS

## 2018-12-13 MED ORDER — CYCLOBENZAPRINE HCL 10 MG PO TABS
10.0000 mg | ORAL_TABLET | Freq: Every evening | ORAL | Status: DC | PRN
  Administered 2018-12-14: 10 mg via ORAL
  Filled 2018-12-13: qty 1

## 2018-12-13 MED ORDER — HEPARIN SODIUM (PORCINE) 1000 UNIT/ML IJ SOLN
INTRAMUSCULAR | Status: AC
  Filled 2018-12-13: qty 1

## 2018-12-13 NOTE — Sedation Documentation (Signed)
Vital signs stable. Pt is resting at this time with no complaints

## 2018-12-13 NOTE — ED Notes (Signed)
Kelly Huff - Bed Control - aware pt had bed request and is going to dialysis

## 2018-12-13 NOTE — ED Notes (Signed)
Notified Dr. Stark Jock of pt's critical K level

## 2018-12-13 NOTE — Procedures (Signed)
Interventional Radiology Procedure Note  Procedure: Placement of a right IJ approach tunneled HD catheter.  Tip is positioned at the superior cavoatrial junction and catheter is ready for immediate use.  Complications: None Recommendations:  - Ok to use - Do not submerge - Routine line care   Signed,  Cammy Sanjurjo S. Kleber Crean, DO    

## 2018-12-13 NOTE — Sedation Documentation (Signed)
Patient is resting comfortably. 

## 2018-12-13 NOTE — H&P (Signed)
[facility]                                                                                    Patient Demographics  Kelly Huff, is a 33 y.o. female  CSN: [id number]  MRN: [id number]  DOB - [birth year]  Admit Date - 12/13/2018  Outpatient Primary MD for the patient is Rubie Maid, MD   With History of -  Past Medical History:  Diagnosis Date  . Chronic kidney disease   . Diabetes mellitus without complication ([place])   . History of pre-eclampsia 2016   mild  . Hypertension       Past Surgical History:  Procedure Laterality Date  . CESAREAN SECTION    . DILATION AND CURETTAGE OF UTERUS     x 4  . IR FLUORO GUIDE CV LINE RIGHT  12/05/2018  . IR FLUORO GUIDE CV LINE RIGHT  12/13/2018  . IR US GUIDE VASC ACCESS RIGHT  12/05/2018  . IR US GUIDE VASC ACCESS RIGHT  12/13/2018    in for   Chief Complaint  Patient presents with  . Vascular Access Problem     HPI  Kelly Huff  is a 33 y.o. female, with past medical history significant for diabetes mellitus type 1 on insulin pump , end-stage renal disease on hemo-dialysis started recently after a period of peritoneal dialysis which was placed on hold recently after a leak, presenting today for missed hemodialysis yesterday after she pulled her permacath by mistake. Patient denies chest pains, shortness of breath but had a vomiting episode today Her potassium was 6.7.  Nephrology INR were consulted for placement of a dialysis catheter and hemodialysis. Patient will be admitted for hyperkalemia, received D50 water and insulin.    Review of Systems    In addition to the HPI above,  No Fever-chills, No Headache, No changes with Vision or hearing, No problems swallowing food or Liquids, Mild shortness of breath, No Abdominal pain, , No Blood in stool or Urine, No dysuria, No new skin rashes or bruises, No new joints pains-aches,  No recent weight gain or loss, No polyuria, polydypsia or  polyphagia, No significant Mental Stressors.  A full 10 point Review of Systems was done, except as stated above, all other Review of Systems were negative.   Social History Social History   Tobacco Use  . Smoking status: Never Smoker  . Smokeless tobacco: Never Used  Substance Use Topics  . Alcohol use: No     Family History Family History  Problem Relation Age of Onset  . Hypertension Mother   . Cancer Mother        Uterine vs cervical (pt unsure),   . Schizophrenia Brother   . Breast cancer Neg Hx      Prior to Admission medications   Medication Sig Start Date End Date Taking? Authorizing Provider  amLODipine (NORVASC) 10 MG tablet Take 10 mg by mouth every evening.  11/06/18  Yes [provider]  B Complex-C-Folic Acid (RENO CAPS) 1 MG CAPS Take 1 mg by mouth daily. 08/02/18  Yes [provider]  calcium acetate (PHOSLO) 667 MG tablet Take 1,334 mg  by mouth 3 (three) times daily with meals. 11/05/18  Yes [provider]  cloNIDine (CATAPRES - DOSED IN MG/24 HR) 0.1 mg/24hr patch Place 0.1 mg onto the skin See admin instructions. Apply patch on every Sunday 10/10/18  Yes [provider]  cyclobenzaprine (FLEXERIL) 10 MG tablet Take 10 mg by mouth at bedtime as needed for muscle spasms.    Yes [provider]  docusate sodium (COLACE) 100 MG capsule Take 100 mg by mouth 2 (two) times daily.   Yes [provider]  gentamicin cream (GARAMYCIN) 0.1 % Apply 1 application topically See admin instructions. Apply small amount to catheter exit site once daily 09/07/18  Yes [provider]  Insulin Human (INSULIN PUMP) SOLN Inject 1 each into the skin continuous. Novolog. Patient stated she is not sure how much insulin is given into the pump   Yes [provider]  lactulose (CHRONULAC) 10 GM/15ML solution Take 20 g by mouth at bedtime as needed (constipation).  11/22/18  Yes [provider]  metoprolol succinate  (TOPROL-XL) 100 MG 24 hr tablet Take 100 mg by mouth 2 (two) times daily. 10/18/18  Yes [provider]  olmesartan (BENICAR) 40 MG tablet Take 40 mg by mouth every morning. 10/13/18  Yes [provider]  paricalcitol (ZEMPLAR) 1 MCG capsule Take 1 mcg by mouth daily.   Yes [provider]    Allergies  Allergen Reactions  . Morphine Itching and Other (See Comments)    Hallucination  . Sulfamethoxazole-Trimethoprim Hives  . Trulicity [Dulaglutide] Other (See Comments)    She is DM Type 1. Was prescribed but damaged her kidneys    Physical Exam  Vitals  Blood pressure (!) 163/99, pulse 89, temperature 98.2 F (36.8 C), temperature source Oral, resp. rate 15, last menstrual period 11/28/2018, SpO2 100 %, unknown if currently breastfeeding.   General appearance, chronically ill, in mild acute distress HEENT no jaundice or pallor, no facial deviation oral thrush Neck supple, no neck vein distention Chest good air entry bilaterally with mild decreased breath sounds at the bases Heart normal S1-S2 no murmurs gallops or rubs Abdomen soft, nontender, bowel sounds are present Extremities no clubbing cyanosis or edema Neuro grossly nonfocal, patient moving all extremities Skin no rashes or ulcers  Data Review  CBC Recent Labs  Lab 12/13/18 1406  WBC 4.8  HGB 9.4*  HCT 29.5*  PLT 283  MCV 85.8  MCH 27.3  MCHC 31.9  RDW 15.9*   ------------------------------------------------------------------------------------------------------------------  Chemistries  Recent Labs  Lab 12/13/18 1406  NA 131*  K 6.7*  CL 94*  CO2 20*  GLUCOSE 369*  BUN 70*  CREATININE 14.04*  CALCIUM 8.7*   ------------------------------------------------------------------------------------------------------------------ estimated creatinine clearance is 6.3 mL/min (A) (by C-G formula based on SCr of 14.04 mg/dL  (H)). ------------------------------------------------------------------------------------------------------------------ No results for input(s): TSH, T4TOTAL, T3FREE, THYROIDAB in the last 72 hours.  Invalid input(s): FREET3   Coagulation profile No results for input(s): INR, PROTIME in the last 168 hours. ------------------------------------------------------------------------------------------------------------------- No results for input(s): DDIMER in the last 72 hours. -------------------------------------------------------------------------------------------------------------------  Cardiac Enzymes No results for input(s): CKMB, TROPONINI, MYOGLOBIN in the last 168 hours.  Invalid input(s): CK ------------------------------------------------------------------------------------------------------------------ Invalid input(s): POCBNP   ---------------------------------------------------------------------------------------------------------------  Urinalysis    Component Value Date/Time   COLORURINE STRAW (A) 11/25/2018 1224   APPEARANCEUR CLEAR 11/25/2018 1224   APPEARANCEUR Clear 06/25/2017 1042   LABSPEC 1.010 11/25/2018 1224   PHURINE 7.0 11/25/2018 1224   GLUCOSEU 50 (A) 11/25/2018 1224  HGBUR MODERATE (A) 11/25/2018 1224   BILIRUBINUR NEGATIVE 11/25/2018 1224   BILIRUBINUR neg 08/05/2017 1514   BILIRUBINUR Negative 06/25/2017 1042   KETONESUR NEGATIVE 11/25/2018 1224   PROTEINUR >=300 (A) 11/25/2018 1224   UROBILINOGEN 0.2 08/05/2017 1514   NITRITE NEGATIVE 11/25/2018 1224   LEUKOCYTESUR NEGATIVE 11/25/2018 1224    ----------------------------------------------------------------------------------------------------------------   Imaging results:   Ct Abdomen Pelvis Wo Contrast  Result Date: 11/25/2018 CLINICAL DATA:  Abdominal pain, constipation, incomplete dialysis EXAM: CT ABDOMEN AND PELVIS WITHOUT CONTRAST TECHNIQUE: Multidetector CT imaging of the  abdomen and pelvis was performed following the standard protocol without IV contrast. COMPARISON:  Abdominal radiograph, 11/24/2018, CT abdomen pelvis, 09/02/2018 FINDINGS: Lower chest: No acute abnormality. Hepatobiliary: No solid liver abnormality is seen. No gallstones, gallbladder wall thickening, or biliary dilatation. Pancreas: Unremarkable. No pancreatic ductal dilatation or surrounding inflammatory changes. Spleen: Normal in size without significant abnormality. Adrenals/Urinary Tract: Adrenal glands are unremarkable. Kidneys are normal, without renal calculi, solid lesion, or hydronephrosis. Thickening of the urinary bladder. Stomach/Bowel: Stomach is within normal limits. Appendix appears normal. No evidence of bowel wall thickening, distention, or inflammatory changes. Scattered stool in the colon. Vascular/Lymphatic: No significant vascular findings are present. No enlarged abdominal or pelvic lymph nodes. Reproductive: No mass or other significant abnormality. Other: No abdominal wall hernia or abnormality. No abdominopelvic ascites. Peritoneal dialysis catheter is positioned with tip in the central low abdomen. Musculoskeletal: No acute or significant osseous findings. IMPRESSION: 1.  No large burden of stool in the colon. 2. Peritoneal dialysis catheter is positioned with tip in the central low abdomen. 3. Thickening of the urinary bladder, nonspecific. Correlate with urinalysis. Electronically Signed   By: Eddie Candle M.D.   On: 11/25/2018 13:20   Dg Abd 1 View  Result Date: 11/24/2018 CLINICAL DATA:  Peritoneal dialysis catheter.  Constipation. EXAM: ABDOMEN - 1 VIEW COMPARISON:  CT 09/02/2018 FINDINGS: Interval placement of catheter within the right hemiabdomen with distal tip coiled in left hemipelvis. No evidence of catheter kinking or discontinuity. Mild colonic stool burden. No gross free intraperitoneal air. Osseous structures are intact and unremarkable. IMPRESSION: Mild colonic stool  burden. Electronically Signed   By: Davina Poke M.D.   On: 11/24/2018 10:20   Ir Fluoro Guide Cv Line Right  Result Date: 12/13/2018 INDICATION: 32 year old female with displaced right IJ tunneled hemodialysis catheter, referred for placement of new catheter. EXAM: ULTRASOUND-GUIDED ACCESS RIGHT INTERNAL JUGULAR VEIN PLACEMENT OF TUNNELED HEMODIALYSIS CATHETER MEDICATIONS: 2 g Ancef; The antibiotic was administered within an appropriate time interval prior to skin puncture. ANESTHESIA/SEDATION: Moderate (conscious) sedation was employed during this procedure. A total of Versed 2.0 mg and Fentanyl 100 mcg was administered intravenously. Moderate Sedation Time: 18 minutes. The patient's level of consciousness and vital signs were monitored continuously by radiology nursing throughout the procedure under my direct supervision. FLUOROSCOPY TIME:  Fluoroscopy Time: 0 minutes 6 seconds (1 mGy). COMPLICATIONS: None immediate. PROCEDURE: Informed written consent was obtained from the patient after a discussion of the risks, benefits, and alternatives to treatment. Questions regarding the procedure were encouraged and answered. The right neck and chest were prepped with chlorhexidine in a sterile fashion, and a sterile drape was applied covering the operative field. Maximum barrier sterile technique with sterile gowns and gloves were used for the procedure. A timeout was performed prior to the initiation of the procedure. After creating a small venotomy incision, a micropuncture kit was utilized to access the right internal jugular vein under direct, real-time ultrasound guidance  after the overlying soft tissues were anesthetized with 1% lidocaine with epinephrine. Ultrasound image documentation was performed. The microwire was marked to measure appropriate internal catheter length. External tunneled length was estimated. A total tip to cuff length of 19 cm was selected. Skin and subcutaneous tissues of chest wall  below the clavicle were generously infiltrated with 1% lidocaine for local anesthesia. A small stab incision was made with 11 blade scalpel. The selected hemodialysis catheter was tunneled in a retrograde fashion from the anterior chest wall to the venotomy incision. A guidewire was advanced to the level of the IVC and the micropuncture sheath was exchanged for a peel-away sheath. The catheter was then placed through the peel-away sheath with tips ultimately positioned within the superior aspect of the right atrium. Final catheter positioning was confirmed and documented with a spot radiographic image. The catheter aspirates and flushes normally. The catheter was flushed with appropriate volume heparin dwells. Gel-Foam slurry was administered into the catheter tract. The catheter exit site was secured with a 0-Prolene retention suture. The venotomy incision was closed Derma bond and sterile dressing. Dressings were applied at the chest wall. Patient tolerated the procedure well and remained hemodynamically stable throughout. No complications were encountered and no significant blood loss encountered. IMPRESSION: Status post right IJ tunneled hemodialysis catheter. Signed, Dulcy Fanny. Dellia Nims, RPVI [facility] Electronically Signed   By: Corrie Mckusick D.O.   On: 12/13/2018 17:14   Ir Fluoro Guide Cv Line Right  Result Date: 12/05/2018 INDICATION: 33 year old with end-stage disease and issues with the peritoneal dialysis catheter. Request for hemodialysis catheter. EXAM: FLUOROSCOPIC AND ULTRASOUND GUIDED PLACEMENT OF A TUNNELED DIALYSIS CATHETER Physician: Stephan Minister. Anselm Pancoast, MD MEDICATIONS: Ancef 2 g; The antibiotic was administered within an appropriate time interval prior to skin puncture. ANESTHESIA/SEDATION: Versed 2.0 mg IV; Fentanyl 100 mcg IV; Moderate Sedation Time:  22 minutes The patient was continuously monitored during the procedure by the  interventional radiology nurse under my direct supervision. FLUOROSCOPY TIME:  Fluoroscopy Time: 30 seconds, 2 mGy COMPLICATIONS: None immediate. PROCEDURE: Informed consent was obtained for placement of a tunneled dialysis catheter. The patient was placed supine on the interventional table. Ultrasound confirmed a patent right internal jugular vein. Ultrasound images were obtained for documentation. The right side of the neck and chest was prepped and draped in a sterile fashion. The right neck was anesthetized with 1% lidocaine. Maximal barrier sterile technique was utilized including caps, mask, sterile gowns, sterile gloves, sterile drape, hand hygiene and skin antiseptic. A small incision was made with #11 blade scalpel. A 21 gauge needle directed into the right internal jugular vein with ultrasound guidance. A micropuncture dilator set was placed. A 19 cm tip to cuff Palindrome catheter was selected. The skin below the right clavicle was anesthetized and a small incision was made with an #11 blade scalpel. A subcutaneous tunnel was formed to the vein dermatotomy site. The catheter was brought through the tunnel. The vein dermatotomy site was dilated to accommodate a peel-away sheath. The catheter was placed through the peel-away sheath and directed into the central venous structures. The tip of the catheter was placed in the SVC and right atrium junction with fluoroscopy. Fluoroscopic images were obtained for documentation. Both lumens were found to aspirate and flush well. The proper amount of heparin was flushed in both lumens. The vein dermatotomy site was closed using a single layer of absorbable suture and Dermabond. Gel-Foam was placed in subcutaneous tract. The  catheter was secured to the skin using Prolene suture. IMPRESSION: Successful placement of a right jugular tunneled dialysis catheter using ultrasound and fluoroscopic guidance. Electronically Signed   By: Markus Daft M.D.   On: 12/05/2018 14:36    Ir US Guide Vasc Access Right  Result Date: 12/13/2018 INDICATION: 33 year old female with displaced right IJ tunneled hemodialysis catheter, referred for placement of new catheter. EXAM: ULTRASOUND-GUIDED ACCESS RIGHT INTERNAL JUGULAR VEIN PLACEMENT OF TUNNELED HEMODIALYSIS CATHETER MEDICATIONS: 2 g Ancef; The antibiotic was administered within an appropriate time interval prior to skin puncture. ANESTHESIA/SEDATION: Moderate (conscious) sedation was employed during this procedure. A total of Versed 2.0 mg and Fentanyl 100 mcg was administered intravenously. Moderate Sedation Time: 18 minutes. The patient's level of consciousness and vital signs were monitored continuously by radiology nursing throughout the procedure under my direct supervision. FLUOROSCOPY TIME:  Fluoroscopy Time: 0 minutes 6 seconds (1 mGy). COMPLICATIONS: None immediate. PROCEDURE: Informed written consent was obtained from the patient after a discussion of the risks, benefits, and alternatives to treatment. Questions regarding the procedure were encouraged and answered. The right neck and chest were prepped with chlorhexidine in a sterile fashion, and a sterile drape was applied covering the operative field. Maximum barrier sterile technique with sterile gowns and gloves were used for the procedure. A timeout was performed prior to the initiation of the procedure. After creating a small venotomy incision, a micropuncture kit was utilized to access the right internal jugular vein under direct, real-time ultrasound guidance after the overlying soft tissues were anesthetized with 1% lidocaine with epinephrine. Ultrasound image documentation was performed. The microwire was marked to measure appropriate internal catheter length. External tunneled length was estimated. A total tip to cuff length of 19 cm was selected. Skin and subcutaneous tissues of chest wall below the clavicle were generously infiltrated with 1% lidocaine for local  anesthesia. A small stab incision was made with 11 blade scalpel. The selected hemodialysis catheter was tunneled in a retrograde fashion from the anterior chest wall to the venotomy incision. A guidewire was advanced to the level of the IVC and the micropuncture sheath was exchanged for a peel-away sheath. The catheter was then placed through the peel-away sheath with tips ultimately positioned within the superior aspect of the right atrium. Final catheter positioning was confirmed and documented with a spot radiographic image. The catheter aspirates and flushes normally. The catheter was flushed with appropriate volume heparin dwells. Gel-Foam slurry was administered into the catheter tract. The catheter exit site was secured with a 0-Prolene retention suture. The venotomy incision was closed Derma bond and sterile dressing. Dressings were applied at the chest wall. Patient tolerated the procedure well and remained hemodynamically stable throughout. No complications were encountered and no significant blood loss encountered. IMPRESSION: Status post right IJ tunneled hemodialysis catheter. Signed, Dulcy Fanny. Dellia Nims, RPVI [facility] Electronically Signed   By: Corrie Mckusick D.O.   On: 12/13/2018 17:14   Ir US Guide Vasc Access Right  Result Date: 12/05/2018 INDICATION: 33 year old with end-stage disease and issues with the peritoneal dialysis catheter. Request for hemodialysis catheter. EXAM: FLUOROSCOPIC AND ULTRASOUND GUIDED PLACEMENT OF A TUNNELED DIALYSIS CATHETER Physician: Stephan Minister. Anselm Pancoast, MD MEDICATIONS: Ancef 2 g; The antibiotic was administered within an appropriate time interval prior to skin puncture. ANESTHESIA/SEDATION: Versed 2.0 mg IV; Fentanyl 100 mcg IV; Moderate Sedation Time:  22 minutes The patient was continuously monitored during the procedure by the interventional radiology nurse under my direct  supervision. FLUOROSCOPY TIME:   Fluoroscopy Time: 30 seconds, 2 mGy COMPLICATIONS: None immediate. PROCEDURE: Informed consent was obtained for placement of a tunneled dialysis catheter. The patient was placed supine on the interventional table. Ultrasound confirmed a patent right internal jugular vein. Ultrasound images were obtained for documentation. The right side of the neck and chest was prepped and draped in a sterile fashion. The right neck was anesthetized with 1% lidocaine. Maximal barrier sterile technique was utilized including caps, mask, sterile gowns, sterile gloves, sterile drape, hand hygiene and skin antiseptic. A small incision was made with #11 blade scalpel. A 21 gauge needle directed into the right internal jugular vein with ultrasound guidance. A micropuncture dilator set was placed. A 19 cm tip to cuff Palindrome catheter was selected. The skin below the right clavicle was anesthetized and a small incision was made with an #11 blade scalpel. A subcutaneous tunnel was formed to the vein dermatotomy site. The catheter was brought through the tunnel. The vein dermatotomy site was dilated to accommodate a peel-away sheath. The catheter was placed through the peel-away sheath and directed into the central venous structures. The tip of the catheter was placed in the SVC and right atrium junction with fluoroscopy. Fluoroscopic images were obtained for documentation. Both lumens were found to aspirate and flush well. The proper amount of heparin was flushed in both lumens. The vein dermatotomy site was closed using a single layer of absorbable suture and Dermabond. Gel-Foam was placed in subcutaneous tract. The catheter was secured to the skin using Prolene suture. IMPRESSION: Successful placement of a right jugular tunneled dialysis catheter using ultrasound and fluoroscopic guidance. Electronically Signed   By: Markus Daft M.D.   On: 12/05/2018 14:36      Assessment & Plan  End-stage renal disease with acute uremia due to  missed dialysis sessions IR consulted for replacement of her dialysis catheter Will have dialysis tonight or in a.m. Will observe in telemetry due to her hyperkalemia  Hyperkalemia Hemodialysis.  According to ER physician patient received D50 water and IV insulin Continue with neb treatments  Hypertension Continue with Norvasc and clonidine  Diabetes mellitus type 1 Continue with insulin pump/ISS  Anemia and metabolic bone disease To be managed by nephrology  DVT Prophylaxis Heparin  AM Labs Ordered, also please review Full Orders    Code Status full  Disposition Plan: Home  Time spent in minutes : 43 minutes  Condition GUARDED   _0 @

## 2018-12-13 NOTE — ED Provider Notes (Signed)
[facility] EMERGENCY DEPARTMENT Provider Note   CSN: [id number] Arrival date & time: 12/13/18  1401     History   Chief Complaint Chief Complaint  Patient presents with  . Vascular Access Problem    HPI Kelly Huff is a 33 y.o. female.     Patient is a 33 year old female with history of DM, ESRD on HD/PD, HTN presenting with complaints of a dislodged dialysis catheter.  She tells me she was "horsing around" with her husband when he inadvertently pulled the catheter out.  This was placed by interventional radiology approximately two weeks ago after she was having problems with her peritoneal dialysis catheter.  She has no other complaints.    The history is provided by the patient.    Past Medical History:  Diagnosis Date  . Chronic kidney disease   . Diabetes mellitus without complication ([place])   . History of pre-eclampsia 2016   mild  . Hypertension     Patient Active Problem List   Diagnosis Date Noted  . Peritoneal dialysis catheter dysfunction ([place]) 12/03/2018  . PD catheter dysfunction ([place]) 12/03/2018  . Acute encephalopathy 09/03/2018  . Hypertensive urgency 09/03/2018  . Enteritis 07/27/2018  . ESRD on dialysis ([place]) 07/27/2018  . Hypertension associated with diabetes ([place]) 07/27/2018  . 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 Elevated troponin 08/14/2017  . CKD (chronic kidney disease) stage 3, GFR 30-59 ml/min (HCC) 08/14/2017  . Chronic hypertension affecting pregnancy 08/13/2017  . Labor and delivery, indication for care 08/12/2017  . 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 Iron deficiency anemia during pregnancy 07/06/2017  . History of pre-eclampsia in prior pregnancy, currently pregnant in first trimester 06/12/2017  . Diabetes ([place]) 04/13/2017  . Acute renal failure ([place])   . Dehydration   . Other nonspecific abnormal finding 01/29/2016  . Family history of BRCA  gene positive 07/13/2013  . 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 CESAREAN SECTION    . DILATION AND CURETTAGE OF UTERUS     x 4  . IR FLUORO GUIDE CV LINE RIGHT  12/05/2018  . IR US GUIDE VASC ACCESS RIGHT  12/05/2018     OB History    Gravida  5   Para  2   Term  2   Preterm      AB  2   Living  2     SAB      TAB  0   Ectopic      Multiple      Live Births  2            Home Medications    Prior to Admission medications   Medication Sig Start Date End Date Taking? Authorizing Provider  amLODipine (NORVASC) 10 MG tablet Take 10 mg by mouth every evening.  11/06/18   [provider]  B Complex-C-Folic Acid (RENO CAPS) 1 MG CAPS Take 1 mg by mouth daily. 08/02/18   [provider]  calcium acetate (PHOSLO) 667 MG tablet Take 1,334 mg by mouth 3 (three) times daily with meals. 11/05/18   [provider]  clobetasol ointment (TEMOVATE) 0.30 % Apply 1 application topically 2 (two) times daily as needed for itching. 03/07/18   [provider]  cloNIDine (CATAPRES - DOSED IN MG/24 HR) 0.1 mg/24hr patch Place 0.1  mg onto the skin every Sunday.  10/10/18   [provider]  cyclobenzaprine (FLEXERIL) 10 MG tablet Take 10 mg by mouth at bedtime as needed for muscle spasms.     [provider]  docusate sodium (COLACE) 100 MG capsule Take 100 mg by mouth 2 (two) times daily.    [provider]  gentamicin cream (GARAMYCIN) 0.1 % Apply 1 application topically See admin instructions. Apply small amount to catheter exit site once daily 09/07/18   [provider]  Insulin Human (INSULIN PUMP) SOLN Inject into the skin continuous. Novolog    [provider]  lactulose (CHRONULAC) 10 GM/15ML solution Take 20 g by mouth at bedtime as needed (constipation).  11/22/18   [provider]   metoprolol succinate (TOPROL-XL) 100 MG 24 hr tablet Take 100 mg by mouth 2 (two) times daily. 10/18/18   [provider]  olmesartan (BENICAR) 40 MG tablet Take 40 mg by mouth every morning. 10/13/18   [provider]  paricalcitol (ZEMPLAR) 1 MCG capsule Take 1 mcg by mouth daily.    [provider]    Family History Family History  Problem Relation Age of Onset  . Hypertension Mother   . Cancer Mother        Uterine vs cervical (pt unsure),   . Schizophrenia Brother   . Breast cancer Neg Hx     Social History Social History   Tobacco Use  . Smoking status: Never Smoker  . Smokeless tobacco: Never Used  Substance Use Topics  . Alcohol use: No  . Drug use: No     Allergies   Morphine, Sulfamethoxazole-trimethoprim, and Trulicity [dulaglutide]   Review of Systems Review of Systems  All other systems reviewed and are negative.    Physical Exam Updated Vital Signs BP (!) 182/118 (BP Location: Left Arm)   Pulse 85   Temp 98.2 F (36.8 C) (Oral)   Resp 18   SpO2 99%   Physical Exam Vitals signs and nursing note reviewed.  Constitutional:      General: She is not in acute distress.    Appearance: She is well-developed. She is not diaphoretic.  HENT:     Head: Normocephalic and atraumatic.  Neck:     Musculoskeletal: Normal range of motion and neck supple.  Cardiovascular:     Rate and Rhythm: Normal rate and regular rhythm.     Heart sounds: No murmur. No friction rub. No gallop.   Pulmonary:     Effort: Pulmonary effort is normal. No respiratory distress.     Breath sounds: Normal breath sounds. No wheezing.  Abdominal:     General: Bowel sounds are normal. There is no distension.     Palpations: Abdomen is soft.     Tenderness: There is no abdominal tenderness.  Musculoskeletal: Normal range of motion.  Skin:    General: Skin is warm and dry.     Comments: The site of the catheter dislodgment appears clean.  There is no  bleeding.  Neurological:     Mental Status: She is alert and oriented to person, place, and time.      ED Treatments / Results  Labs (all labs ordered are listed, but only abnormal results are displayed) Labs Reviewed  CBC - Abnormal; Notable for the following components:      Result Value   RBC 3.44 (*)    Hemoglobin 9.4 (*)    HCT 29.5 (*)    RDW 15.9 (*)  All other components within normal limits  BASIC METABOLIC PANEL    EKG EKG Interpretation  Date/Time:  Tuesday December 13 2018 14:46:02 EDT Ventricular Rate:  84 PR Interval:    QRS Duration: 94 QT Interval:  390 QTC Calculation: 461 R Axis:   64 Text Interpretation:  Sinus rhythm Probable left atrial enlargement Confirmed by Veryl Speak [phone]) on 12/13/2018 4:52:26 PM   Radiology No results found.  Procedures Procedures (including critical care time)  Medications Ordered in ED Medications - No data to display   Initial Impression / Assessment and Plan / ED Course  I have reviewed the triage vital signs and the nursing notes.  Pertinent labs & imaging results that were available during my care of the patient were reviewed by me and considered in my medical decision making (see chart for details).  Patient with history of end-stage renal disease on hemodialysis.  She presents today with her dialysis catheter having been inadvertently dislodged.  Patient's laboratory studies show a potassium of 6.7 with no obvious EKG changes.  Patient was given insulin and glucose as well as calcium.  Interventional radiology was consulted and Dr. Carman Ching will replace her catheter.  I have also discussed the case with Dr. Jonnie Finner who will make arrangements for dialysis once the catheter is in place.  He is also recommending the patient be admitted overnight for observation and stabilization of her potassium.  I have spoken with Dr. Laren Everts who agrees to admit.  CRITICAL CARE Performed by: Veryl Speak Total critical  care time: 45 minutes Critical care time was exclusive of separately billable procedures and treating other patients. Critical care was necessary to treat or prevent imminent or life-threatening deterioration. Critical care was time spent personally by me on the following activities: development of treatment plan with patient and/or surrogate as well as nursing, discussions with consultants, evaluation of patient's response to treatment, examination of patient, obtaining history from patient or surrogate, ordering and performing treatments and interventions, ordering and review of laboratory studies, ordering and review of radiographic studies, pulse oximetry and re-evaluation of patient's condition.   Final Clinical Impressions(s) / ED Diagnoses   Final diagnoses:  None    ED Discharge Orders    None       Veryl Speak, MD 12/13/18 5183

## 2018-12-13 NOTE — ED Notes (Signed)
Pt transported to interventional radiology

## 2018-12-13 NOTE — Consult Note (Signed)
Chief Complaint: Patient was seen in consultation today for ESRD on HD/tunneled HD catheter placement.  Referring Physician(s): Stark Jock, Nathaneil Canary  Supervising Physician: Corrie Mckusick  Patient Status: [facility] - ED  History of Present Illness: Kelly Huff is a 33 y.o. female with a past medical history of hypertension, ESRD on HD, and diabetes mellitus. She is known to IR, she underwent an image-guided tunneled HD catheter placed 12/05/2018 by Dr. Anselm Pancoast. She presented to [facility] ED via EMS due to her HD catheter accidentally being dislodged. States that she attends dialysis Mondays, Wednesdays, and Fridays- she underwent dialysis Friday without issues, however she missed dialysis yesterday (had an appointment for PD catheter). States that she was "playing" with her husband when HD catheter was accidentally dislodged. BMP in ED revealed K+ 6.7- she states she is planning on attending dialysis tomorrow.  IR requested by Dr. Stark Jock for possible image-guided tunneled HD catheter placement. Patient awake and alert laying in bed with no complaints at this time. Denies fever, chills, chest pain, dyspnea, abdominal pain, or headache.   Past Medical History:  Diagnosis Date   Chronic kidney disease    Diabetes mellitus without complication ([place])    History of pre-eclampsia 2016   mild   Hypertension     Past Surgical History:  Procedure Laterality Date   CESAREAN SECTION     DILATION AND CURETTAGE OF UTERUS     x 4   IR FLUORO GUIDE CV LINE RIGHT  12/05/2018   IR US GUIDE VASC ACCESS RIGHT  12/05/2018    Allergies: Morphine, Sulfamethoxazole-trimethoprim, and Trulicity [dulaglutide]  Medications: Prior to Admission medications   Medication Sig Start Date End Date Taking? Authorizing Provider  amLODipine (NORVASC) 10 MG tablet Take 10 mg by mouth every evening.  11/06/18   [provider]  B Complex-C-Folic Acid (RENO CAPS) 1 MG CAPS Take 1 mg by mouth daily. 08/02/18   [provider]  calcium acetate (PHOSLO) 667 MG tablet Take 1,334 mg by mouth 3 (three) times daily with meals. 11/05/18   [provider]  clobetasol ointment (TEMOVATE) [id number] % Apply 1 application topically 2 (two) times daily as needed for itching. 03/07/18   [provider]  cloNIDine (CATAPRES - DOSED IN MG/24 HR) 0.1 mg/24hr patch Place 0.1 mg onto the skin every Sunday.  10/10/18   [provider]  cyclobenzaprine (FLEXERIL) 10 MG tablet Take 10 mg by mouth at bedtime as needed for muscle spasms.     [provider]  docusate sodium (COLACE) 100 MG capsule Take 100 mg by mouth 2 (two) times daily.    [provider]  gentamicin cream (GARAMYCIN) 0.1 % Apply 1 application topically See admin instructions. Apply small amount to catheter exit site once daily 09/07/18   [provider]  Insulin Human (INSULIN PUMP) SOLN Inject into the skin continuous. Novolog    [provider]  lactulose (CHRONULAC) 10 GM/15ML solution Take 20 g by mouth at bedtime as needed (constipation).  11/22/18   [provider]  metoprolol succinate (TOPROL-XL) 100 MG 24 hr tablet Take 100 mg by mouth 2 (two) times daily. 10/18/18   [provider]  olmesartan (BENICAR) 40 MG tablet Take 40 mg by mouth every morning. 10/13/18   [provider]  paricalcitol (ZEMPLAR) 1 MCG capsule Take 1 mcg by mouth daily.    [provider]     Family History  Problem Relation Age of Onset   Hypertension Mother  Cancer Mother        Uterine vs cervical (pt unsure),    Schizophrenia Brother    Breast cancer Neg Hx     Social History   Socioeconomic History   Marital status: Married    Spouse name: Not on file   Number of children: Not on file   Years of education: Not on file   Highest education level: Not on file  Occupational History   Not on file  Social Needs   Financial resource strain: Not on file   Food  insecurity    Worry: Not on file    Inability: Not on file   Transportation needs    Medical: Not on file    Non-medical: Not on file  Tobacco Use   Smoking status: Never Smoker   Smokeless tobacco: Never Used  Substance and Sexual Activity   Alcohol use: No   Drug use: No   Sexual activity: Yes    Birth control/protection: None, Surgical    Comment: tubial  Lifestyle   Physical activity    Days per week: Not on file    Minutes per session: Not on file   Stress: Not on file  Relationships   Social connections    Talks on phone: Not on file    Gets together: Not on file    Attends religious service: Not on file    Active member of club or organization: Not on file    Attends meetings of clubs or organizations: Not on file    Relationship status: Not on file  Other Topics Concern   Not on file  Social History Narrative   Not on file     Review of Systems: A 12 point ROS discussed and pertinent positives are indicated in the HPI above.  All other systems are negative.  Review of Systems  Constitutional: Negative for chills and fever.  Respiratory: Negative for shortness of breath and wheezing.   Cardiovascular: Negative for chest pain and palpitations.  Gastrointestinal: Negative for abdominal pain.  Neurological: Negative for headaches.  Psychiatric/Behavioral: Negative for behavioral problems and confusion.    Vital Signs: BP (!) 181/101    Pulse 82    Temp 98.2 F (36.8 C) (Oral)    Resp 18    SpO2 100%   Physical Exam Vitals signs and nursing note reviewed.  Constitutional:      General: She is not in acute distress.    Appearance: Normal appearance.  Cardiovascular:     Rate and Rhythm: Normal rate and regular rhythm.     Heart sounds: Normal heart sounds. No murmur.  Pulmonary:     Effort: Pulmonary effort is normal. No respiratory distress.     Breath sounds: Normal breath sounds. No wheezing.  Skin:    General: Skin is warm and dry.    Neurological:     Mental Status: She is alert and oriented to person, place, and time.  Psychiatric:        Mood and Affect: Mood normal.        Behavior: Behavior normal.        Thought Content: Thought content normal.        Judgment: Judgment normal.      MD Evaluation Airway: WNL Heart: WNL Abdomen: WNL Chest/ Lungs: WNL ASA  Classification: 3 Mallampati/Airway Score: One   Imaging: Ct Abdomen Pelvis Wo Contrast  Result Date: 11/25/2018 CLINICAL DATA:  Abdominal pain, constipation, incomplete dialysis EXAM: CT ABDOMEN AND PELVIS  WITHOUT CONTRAST TECHNIQUE: Multidetector CT imaging of the abdomen and pelvis was performed following the standard protocol without IV contrast. COMPARISON:  Abdominal radiograph, 11/24/2018, CT abdomen pelvis, 09/02/2018 FINDINGS: Lower chest: No acute abnormality. Hepatobiliary: No solid liver abnormality is seen. No gallstones, gallbladder wall thickening, or biliary dilatation. Pancreas: Unremarkable. No pancreatic ductal dilatation or surrounding inflammatory changes. Spleen: Normal in size without significant abnormality. Adrenals/Urinary Tract: Adrenal glands are unremarkable. Kidneys are normal, without renal calculi, solid lesion, or hydronephrosis. Thickening of the urinary bladder. Stomach/Bowel: Stomach is within normal limits. Appendix appears normal. No evidence of bowel wall thickening, distention, or inflammatory changes. Scattered stool in the colon. Vascular/Lymphatic: No significant vascular findings are present. No enlarged abdominal or pelvic lymph nodes. Reproductive: No mass or other significant abnormality. Other: No abdominal wall hernia or abnormality. No abdominopelvic ascites. Peritoneal dialysis catheter is positioned with tip in the central low abdomen. Musculoskeletal: No acute or significant osseous findings. IMPRESSION: 1.  No large burden of stool in the colon. 2. Peritoneal dialysis catheter is positioned with tip in the  central low abdomen. 3. Thickening of the urinary bladder, nonspecific. Correlate with urinalysis. Electronically Signed   By: Eddie Candle M.D.   On: 11/25/2018 13:20   Dg Abd 1 View  Result Date: 11/24/2018 CLINICAL DATA:  Peritoneal dialysis catheter.  Constipation. EXAM: ABDOMEN - 1 VIEW COMPARISON:  CT 09/02/2018 FINDINGS: Interval placement of catheter within the right hemiabdomen with distal tip coiled in left hemipelvis. No evidence of catheter kinking or discontinuity. Mild colonic stool burden. No gross free intraperitoneal air. Osseous structures are intact and unremarkable. IMPRESSION: Mild colonic stool burden. Electronically Signed   By: Davina Poke M.D.   On: 11/24/2018 10:20   Ir Fluoro Guide Cv Line Right  Result Date: 12/05/2018 INDICATION: 33 year old with end-stage disease and issues with the peritoneal dialysis catheter. Request for hemodialysis catheter. EXAM: FLUOROSCOPIC AND ULTRASOUND GUIDED PLACEMENT OF A TUNNELED DIALYSIS CATHETER Physician: Stephan Minister. Anselm Pancoast, MD MEDICATIONS: Ancef 2 g; The antibiotic was administered within an appropriate time interval prior to skin puncture. ANESTHESIA/SEDATION: Versed 2.0 mg IV; Fentanyl 100 mcg IV; Moderate Sedation Time:  22 minutes The patient was continuously monitored during the procedure by the interventional radiology nurse under my direct supervision. FLUOROSCOPY TIME:  Fluoroscopy Time: 30 seconds, 2 mGy COMPLICATIONS: None immediate. PROCEDURE: Informed consent was obtained for placement of a tunneled dialysis catheter. The patient was placed supine on the interventional table. Ultrasound confirmed a patent right internal jugular vein. Ultrasound images were obtained for documentation. The right side of the neck and chest was prepped and draped in a sterile fashion. The right neck was anesthetized with 1% lidocaine. Maximal barrier sterile technique was utilized including caps, mask, sterile gowns, sterile gloves, sterile drape, hand  hygiene and skin antiseptic. A small incision was made with #11 blade scalpel. A 21 gauge needle directed into the right internal jugular vein with ultrasound guidance. A micropuncture dilator set was placed. A 19 cm tip to cuff Palindrome catheter was selected. The skin below the right clavicle was anesthetized and a small incision was made with an #11 blade scalpel. A subcutaneous tunnel was formed to the vein dermatotomy site. The catheter was brought through the tunnel. The vein dermatotomy site was dilated to accommodate a peel-away sheath. The catheter was placed through the peel-away sheath and directed into the central venous structures. The tip of the catheter was placed in the SVC and right atrium junction with fluoroscopy. Fluoroscopic images were  obtained for documentation. Both lumens were found to aspirate and flush well. The proper amount of heparin was flushed in both lumens. The vein dermatotomy site was closed using a single layer of absorbable suture and Dermabond. Gel-Foam was placed in subcutaneous tract. The catheter was secured to the skin using Prolene suture. IMPRESSION: Successful placement of a right jugular tunneled dialysis catheter using ultrasound and fluoroscopic guidance. Electronically Signed   By: Markus Daft M.D.   On: 12/05/2018 14:36   Ir US Guide Vasc Access Right  Result Date: 12/05/2018 INDICATION: 33 year old with end-stage disease and issues with the peritoneal dialysis catheter. Request for hemodialysis catheter. EXAM: FLUOROSCOPIC AND ULTRASOUND GUIDED PLACEMENT OF A TUNNELED DIALYSIS CATHETER Physician: Stephan Minister. Anselm Pancoast, MD MEDICATIONS: Ancef 2 g; The antibiotic was administered within an appropriate time interval prior to skin puncture. ANESTHESIA/SEDATION: Versed 2.0 mg IV; Fentanyl 100 mcg IV; Moderate Sedation Time:  22 minutes The patient was continuously monitored during the procedure by the interventional radiology nurse under my direct supervision. FLUOROSCOPY  TIME:  Fluoroscopy Time: 30 seconds, 2 mGy COMPLICATIONS: None immediate. PROCEDURE: Informed consent was obtained for placement of a tunneled dialysis catheter. The patient was placed supine on the interventional table. Ultrasound confirmed a patent right internal jugular vein. Ultrasound images were obtained for documentation. The right side of the neck and chest was prepped and draped in a sterile fashion. The right neck was anesthetized with 1% lidocaine. Maximal barrier sterile technique was utilized including caps, mask, sterile gowns, sterile gloves, sterile drape, hand hygiene and skin antiseptic. A small incision was made with #11 blade scalpel. A 21 gauge needle directed into the right internal jugular vein with ultrasound guidance. A micropuncture dilator set was placed. A 19 cm tip to cuff Palindrome catheter was selected. The skin below the right clavicle was anesthetized and a small incision was made with an #11 blade scalpel. A subcutaneous tunnel was formed to the vein dermatotomy site. The catheter was brought through the tunnel. The vein dermatotomy site was dilated to accommodate a peel-away sheath. The catheter was placed through the peel-away sheath and directed into the central venous structures. The tip of the catheter was placed in the SVC and right atrium junction with fluoroscopy. Fluoroscopic images were obtained for documentation. Both lumens were found to aspirate and flush well. The proper amount of heparin was flushed in both lumens. The vein dermatotomy site was closed using a single layer of absorbable suture and Dermabond. Gel-Foam was placed in subcutaneous tract. The catheter was secured to the skin using Prolene suture. IMPRESSION: Successful placement of a right jugular tunneled dialysis catheter using ultrasound and fluoroscopic guidance. Electronically Signed   By: Markus Daft M.D.   On: 12/05/2018 14:36    Labs:  CBC: Recent Labs    12/04/18 0555 12/05/18 0409  12/05/18 1754 12/13/18 1406  WBC 4.5 4.2 4.6 4.8  HGB 7.5* 7.9* 7.6* 9.4*  HCT 23.1* 23.8* 23.2* 29.5*  PLT 282 278 274 283    COAGS: Recent Labs    12/05/18 0409  INR 1.1    BMP: Recent Labs    12/04/18 0555 12/05/18 0409 12/05/18 1754 12/13/18 1406  NA 138 136 135 131*  K 4.5 4.9 4.7 6.7*  CL 101 100 100 94*  CO2 18* 19* 19* 20*  GLUCOSE 120* 160* 207* 369*  BUN 84* 85* 87* 70*  CALCIUM 6.4* 6.7* 6.7* 8.7*  CREATININE 15.96* 16.15* 16.89* 14.04*  GFRNONAA 3* 3* 2* 3*  GFRAA  3* 3* 3* 3*    LIVER FUNCTION TESTS: Recent Labs    09/26/18 0947 11/21/18 0906 11/25/18 0921 12/04/18 0555 12/05/18 0409 12/05/18 1754  BILITOT 0.3 0.6 0.4 0.5  --   --   AST 15 14* 14* 12*  --   --   ALT 10 13 10 5   --   --   ALKPHOS 121 99 101 77  --   --   PROT 7.6 7.0 6.3* 5.3*  --   --   ALBUMIN 3.6 3.3* 3.0* 2.6* 2.7* 2.6*     Assessment and Plan:  ESRD on HD. Plan for image-guided tunneled HD catheter placement today with Dr. Earleen Newport. Patient is NPO. Afebrile and WBCs WNL. She does not take blood thinners. INR 1.1 12/05/2018, ok to proceed without repeat per Dr. Earleen Newport.  Risks and benefits discussed with the patient including, but not limited to bleeding, infection, vascular injury, pneumothorax which may require chest tube placement, air embolism or even death. All of the patient's questions were answered, patient is agreeable to proceed. Consent signed and in chart.   Thank you for this interesting consult.  I greatly enjoyed meeting Kelly Huff and look forward to participating in their care.  A copy of this report was sent to the requesting provider on this date.  Electronically Signed: Earley Abide, PA-C 12/13/2018, 3:24 PM   I spent a total of 40 Minutes in face to face in clinical consultation, greater than 50% of which was counseling/coordinating care for ESRD on HD/tunneled HD catheter placement.

## 2018-12-13 NOTE — Progress Notes (Signed)
Attempted to get report x 1. 

## 2018-12-13 NOTE — ED Triage Notes (Signed)
Pt brought in by ems for HD catheter accidentally being pulled out , part of the end of the catheter is missing ;  Patient states she goes to HD , m,w,f and last went on Friday ' pt alert and oriented x 4 ; bleeding controlled at this time, denies any chest pain or sob

## 2018-12-13 NOTE — ED Notes (Signed)
Report given to Fanta long, RN in dialysis

## 2018-12-13 NOTE — Sedation Documentation (Signed)
Vital signs stable. 

## 2018-12-13 NOTE — Consult Note (Signed)
[facility] Consult  Reason for consult:  Pulled out HD Permcath    Kelly Huff is a 33 y.o. 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 She inadvertently pulled [name] cath out today "horsing  around with husband." K 6.7 and had vomiting x 1 in ER .  IR consulted for replacement of perm cath     Dialysis:  [place] MWF  Time ?   78kg  2/2.5 bath  Hep 8000  RIJ TDC/ PD cath No [facility]s [facility]    Past Medical History:  Diagnosis Date  . Chronic kidney disease   . Diabetes mellitus without complication ([place])   . History of pre-eclampsia 2016   mild  . Hypertension     Past Surgical History:  Procedure Laterality Date  . CESAREAN SECTION    . DILATION AND CURETTAGE OF UTERUS     x 4  . IR FLUORO GUIDE CV LINE RIGHT  12/05/2018  . IR US GUIDE VASC ACCESS RIGHT  12/05/2018      Family History  Problem Relation Age of Onset  . Hypertension Mother   . Cancer Mother        Uterine vs cervical (pt unsure),   . Schizophrenia Brother   . Breast cancer Neg Hx       reports that she has never smoked. She has never used smokeless tobacco. She reports that she does not drink alcohol or use drugs.    Allergies  Allergen Reactions  . Morphine Itching and Other (See Comments)    Hallucination  . Sulfamethoxazole-Trimethoprim Hives  . Trulicity [Dulaglutide] Other (See Comments)    She is DM Type 1. Was prescribed but damaged her kidneys    Prior to Admission medications   Medication Sig Start Date End Date Taking? Authorizing Provider   amLODipine (NORVASC) 10 MG tablet Take 10 mg by mouth every evening.  11/06/18   [provider]  B Complex-C-Folic Acid (RENO CAPS) 1 MG CAPS Take 1 mg by mouth daily. 08/02/18   [provider]  calcium acetate (PHOSLO) 667 MG tablet Take 1,334 mg by mouth 3 (three) times daily with meals. 11/05/18   [provider]  clobetasol ointment (TEMOVATE) [id number] % Apply 1 application topically 2 (two) times daily as needed for itching. 03/07/18   [provider]  cloNIDine (CATAPRES - DOSED IN MG/24 HR) 0.1 mg/24hr patch Place 0.1 mg onto the skin every Sunday.  10/10/18   [provider]  cyclobenzaprine (FLEXERIL) 10 MG tablet Take 10 mg by mouth at bedtime as needed for muscle spasms.     [provider]  docusate sodium (COLACE) 100 MG capsule Take 100 mg by mouth 2 (two) times daily.    [provider]  gentamicin cream (GARAMYCIN) 0.1 % Apply 1 application topically See admin instructions. Apply small amount to catheter exit site once daily 09/07/18   [provider]  Insulin Human (INSULIN PUMP) SOLN Inject into the skin continuous.  Novolog    [provider]  lactulose (CHRONULAC) 10 GM/15ML solution Take 20 g by mouth at bedtime as needed (constipation).  11/22/18   [provider]  metoprolol succinate (TOPROL-XL) 100 MG 24 hr tablet Take 100 mg by mouth 2 (two) times daily. 10/18/18   [provider]  olmesartan (BENICAR) 40 MG tablet Take 40 mg by mouth every morning. 10/13/18   [provider]  paricalcitol (ZEMPLAR) 1 MCG capsule Take 1 mcg by mouth daily.    [provider]    Results for orders placed or performed during the hospital encounter of 12/13/18 (from the past 48 hour(s))  CBC     Status: Abnormal   Collection Time: 12/13/18  2:06 PM  Result Value Ref Range   WBC 4.8 4.0 - 10.5 K/uL   RBC 3.44 (L) 3.87 - 5.11 MIL/uL   Hemoglobin 9.4 (L) 12.0 - 15.0 g/dL   HCT 29.5 (L)  36.0 - 46.0 %   MCV 85.8 80.0 - 100.0 fL   MCH 27.3 26.0 - 34.0 pg   MCHC 31.9 30.0 - 36.0 g/dL   RDW 15.9 (H) 11.5 - 15.5 %   Platelets 283 150 - 400 K/uL   nRBC 0.0 0.0 - 0.2 %    Comment: Performed at [facility], [place] [address], [place], [place] [id number]  Basic metabolic panel     Status: Abnormal   Collection Time: 12/13/18  2:06 PM  Result Value Ref Range   Sodium 131 (L) 135 - 145 mmol/L   Potassium 6.7 (HH) 3.5 - 5.1 mmol/L    Comment: CRITICAL RESULT CALLED TO, READ BACK BY AND VERIFIED WITH: BANKS,A RN @ 1443 12/13/18 LEONARD,A    Chloride 94 (L) 98 - 111 mmol/L   CO2 20 (L) 22 - 32 mmol/L   Glucose, Bld 369 (H) 70 - 99 mg/dL   BUN 70 (H) 6 - 20 mg/dL   Creatinine, Ser 14.04 (H) 0.44 - 1.00 mg/dL   Calcium 8.7 (L) 8.9 - 10.3 mg/dL   GFR calc non Af Amer 3 (L) >60 mL/min   GFR calc Af Amer 3 (L) >60 mL/min   Anion gap 17 (H) 5 - 15    Comment: Performed at [facility] [address] [place], [place] [address]     Physical Exam: Vitals:   12/13/18 1632 12/13/18 1645  BP:  (!) 180/103  Pulse: 85 87  Resp: (!) 24 13  Temp:    SpO2: 100% 100%     General: General: Well developed, well nourished, in no acute distress. Head: Normocephalic, atraumatic Neck: Supple without lymphadenopathy/masses. JVD not elevated. Lungs: Clear bilaterally to auscultation no rales or wheezing Heart: RRR with normal S1, S2. No murmurs, rubs, or gallops appreciated. Abdomen: Soft, ntnd,  + PD cath R side. + BS Musculoskeletal:  Strength and tone appear normal for age. Lower extremities: No edema or ischemic changes, no open wounds. Neuro: Alert and oriented X 3. Moves all extremities spontaneously. Psych:  Responds to questions appropriately with a normal affect  R IJ TDC just placed by IR   Assessment/Plan 1. Hyperkalemia 2/2 missed hd- IV meds in ER then HD tonight after permcath placed, get K+ down. Will need admission for f/u K+, emesis in ED and new TDC.   2. ESRD -  HD MWF currently / current PD malfunction "leak" resting PD until fu with appointment with Dr.Teppara  3. Hypertension/volume  - meds and hd  h 4. Anemia  - hgb 9.4 ,no current ESA as op fu hgb trend  5. Metabolic bone disease -  Needs binder compliance with op phos 10 and 8  6. DM type 1   Ernest Haber, PA-C [place] [phone] 12/13/2018, 4:50 PM   Pt seen, examined and agree w A/P as above.  Kelly Splinter  MD 12/13/2018, 5:09 PM

## 2018-12-14 DIAGNOSIS — E875 Hyperkalemia: Secondary | ICD-10-CM | POA: Diagnosis not present

## 2018-12-14 DIAGNOSIS — T82520A Displacement of surgically created arteriovenous fistula, initial encounter: Secondary | ICD-10-CM | POA: Diagnosis not present

## 2018-12-14 LAB — BASIC METABOLIC PANEL
Anion gap: 12 (ref 5–15)
BUN: 27 mg/dL — ABNORMAL HIGH (ref 6–20)
CO2: 26 mmol/L (ref 22–32)
Calcium: 8 mg/dL — ABNORMAL LOW (ref 8.9–10.3)
Chloride: 95 mmol/L — ABNORMAL LOW (ref 98–111)
Creatinine, Ser: 8.03 mg/dL — ABNORMAL HIGH (ref 0.44–1.00)
GFR calc Af Amer: 7 mL/min — ABNORMAL LOW (ref >60)
GFR calc non Af Amer: 6 mL/min — ABNORMAL LOW (ref >60)
Glucose, Bld: 207 mg/dL — ABNORMAL HIGH (ref 70–99)
Potassium: 3.9 mmol/L (ref 3.5–5.1)
Sodium: 133 mmol/L — ABNORMAL LOW (ref 135–145)

## 2018-12-14 LAB — GLUCOSE, CAPILLARY
Glucose-Capillary: 69 mg/dL — ABNORMAL LOW (ref 70–99)
Glucose-Capillary: 90 mg/dL (ref 70–99)

## 2018-12-14 MED ORDER — ALBUTEROL SULFATE (2.5 MG/3ML) 0.083% IN NEBU: 2.5000 mg | INHALATION_SOLUTION | Freq: Four times a day (QID) | RESPIRATORY_TRACT | Status: DC | PRN

## 2018-12-14 MED ORDER — INSULIN PUMP
Freq: Three times a day (TID) | SUBCUTANEOUS | Status: DC
  Filled 2018-12-14: qty 1

## 2018-12-14 MED ORDER — ACETAMINOPHEN 325 MG PO TABS
650.0000 mg | ORAL_TABLET | Freq: Four times a day (QID) | ORAL | Status: DC | PRN
  Administered 2018-12-14: 650 mg via ORAL
  Filled 2018-12-14: qty 2

## 2018-12-14 MED ORDER — AMLODIPINE BESYLATE 10 MG PO TABS: 10.0000 mg | ORAL_TABLET | Freq: Every day | ORAL | Status: DC

## 2018-12-14 NOTE — Discharge Instructions (Signed)
Chronic Kidney Disease, Adult °Chronic kidney disease (CKD) happens when the kidneys are damaged over a long period of time. The kidneys are two organs that help with: °· Getting rid of waste and extra fluid from the blood. °· Making hormones that maintain the amount of fluid in your tissues and blood vessels. °· Making sure that the body has the right amount of fluids and chemicals. °Most of the time, CKD does not go away, but it can usually be controlled. Steps must be taken to slow down the kidney damage or to stop it from getting worse. If this is not done, the kidneys may stop working. °Follow these instructions at home: °Medicines °· Take over-the-counter and prescription medicines only as told by your doctor. You may need to change the amount of medicines you take. °· Do not take any new medicines unless your doctor says it is okay. Many medicines can make your kidney damage worse. °· Do not take any vitamin and supplements unless your doctor says it is okay. Many vitamins and supplements can make your kidney damage worse. °General instructions °· Follow a diet as told by your doctor. You may need to stay away from: °? Alcohol. °? Salty foods. °? Foods that are high in: °§ Potassium. °§ Calcium. °§ Protein. °· Do not use any products that contain nicotine or tobacco, such as cigarettes and e-cigarettes. If you need help quitting, ask your doctor. °· Keep track of your blood pressure at home. Tell your doctor about any changes. °· If you have diabetes, keep track of your blood sugar as told by your doctor. °· Try to stay at a healthy weight. If you need help, ask your doctor. °· Exercise at least 30 minutes a day, 5 days a week. °· Stay up-to-date with your shots (immunizations) as told by your doctor. °· Keep all follow-up visits as told by your doctor. This is important. °Contact a doctor if: °· Your symptoms get worse. °· You have new symptoms. °Get help right away if: °· You have symptoms of end-stage  kidney disease. These may include: °? Headaches. °? Numbness in your hands or feet. °? Easy bruising. °? Having hiccups often. °? Chest pain. °? Shortness of breath. °? Stopping of menstrual periods in women. °· You have a fever. °· You have very little pee (urine). °· You have pain or bleeding when you pee. °Summary °· Chronic kidney disease (CKD) happens when the kidneys are damaged over a long period of time. °· Most of the time, this condition does not go away, but it can usually be controlled. Steps must be taken to slow down the kidney damage or to stop it from getting worse. °· Treatment may include a combination of medicines and lifestyle changes. °This information is not intended to replace advice given to you by your health care provider. Make sure you discuss any questions you have with your health care provider. °Document Released: 07/08/2009 Document Revised: 03/26/2017 Document Reviewed: 05/18/2016 °Elsevier Patient Education © 2020 [employer] ° °

## 2018-12-14 NOTE — Progress Notes (Signed)
DISCHARGE NOTE HOME Kelly Huff to be discharged Home per MD order. Discussed prescriptions and follow up appointments with the patient. Prescriptions given to patient; medication list explained in detail. Patient verbalized understanding.  Skin clean, dry and intact without evidence of skin break down, no evidence of skin tears noted. IV catheter discontinued intact. Site without signs and symptoms of complications. Dressing and pressure applied. Pt denies pain at the site currently. No complaints noted.  Patient free of lines, drains, and wounds.   An After Visit Summary (AVS) was printed and given to the patient. Patient escorted via wheelchair, and discharged home via private auto.  Aneta Mins BSN, RN3

## 2018-12-14 NOTE — Progress Notes (Signed)
Inpatient Diabetes Program Recommendations  AACE/ADA: New Consensus Statement on Inpatient Glycemic Control  Target Ranges:  Prepandial:   less than 140 mg/dL      Peak postprandial:   less than 180 mg/dL (1-2 hours)      Critically ill patients:  140 - 180 mg/dL   Results for ANABELL, CHOLICO (MRN [id number]) as of 12/14/2018 09:01  Ref. Range 12/13/2018 17:51 12/13/2018 22:38 12/14/2018 04:38 12/14/2018 05:45  Glucose-Capillary Latest Ref Range: 70 - 99 mg/dL 77 197 (H) 69 (L) 90   Review of Glycemic Control  Diabetes history: DM1 (makes NO insulin; requires basal, meal coverage, and correction insulin) Outpatient Diabetes medications: Onmipod Insulin Pump Current orders for Inpatient glycemic control: Insulin Pump AC&HS and 2am  NOTE: Patient has DM1 and uses an insulin pump for all insulin needs outpatient and is currently using insulin pump for inpatient glycemic control. Per progress note on 12/05/18 by S. Jannifer Franklin, RN, Diabetes Coordinator patient sees Dr. Buddy Duty (Endocrinologist) and the following should be patient's insulin pump settings:  Basal: 1A-6A              0.75 units/hour 6A-9P              1 unit/hour 9P-12A            0.95 units/hour  A total of 21.6 units of basal insulin in a 24 hour period  Carb Coverage: 1 units for every 6 grams of carbs  Sensitivity:  1 units drops glucose 65 points  NURSING: Insulin pump order set just ordered. Please print off the Patient insulin pump contract and flow sheet. The insulin pump contract should be signed by the patient and then placed in the chart. The patient insulin pump flow sheet will be completed by the patient at the bedside and the RN caring for the patient will use the patient's flow sheet to document in the [facility]. RN will need to complete the Nursing Insulin Pump Flowsheet at least once a shift. Patient will need to keep extra insulin pump supplies at the bedside at all times.   Thanks, Barnie Alderman, RN, MSN, CDE Diabetes  Coordinator Inpatient Diabetes Program [phone] (Team Pager from 8am to 5pm)

## 2018-12-14 NOTE — Discharge Summary (Signed)
Physician Discharge Summary  [name] VOZ:[id number] DOB: 1986-02-16 DOA: 12/13/2018  PCP: Rubie Maid, MD  Admit date: 12/13/2018 Discharge date: 12/14/2018  Admitted From: Home Disposition: Home  Recommendations for Outpatient Follow-up:  1. Follow up with PCP in 1-2 weeks 2. Please obtain BMP/CBC in one week 3. Please follow up on the following pending results:  Home Health: None Equipment/Devices: None  Discharge Condition: Stable CODE STATUS: Full code Diet recommendation: Renal/cardiac  Subjective: Seen and examined.  She has no complaints.  She is willing to go home and resume her HD per schedule.  Brief/Interim Summary: Kelly Huff  is a 33 y.o. female, with past medical history significant for diabetes mellitus type 1 on insulin pump , end-stage renal disease on hemo-dialysis started recently after a period of peritoneal dialysis which was placed on hold recently after a leak, presented for missed hemodialysis yesterday after she pulled her permacath by mistake. Patient denied chest pains, shortness of breath but had a vomiting episode today Her potassium was 6.7.  Nephrology were consulted for HD and then eventually IR were consulted for placement of a dialysis catheter and hemodialysis which was also done.  Patient's hyperkalemia improved.  Patient is doing well.  Patient is scheduled to have hemodialysis at noon today based on her schedule of MWF.  Renal coordinator would prefer her to go outpatient and receive her dialysis as per schedule.  Patient is doing well and has no symptoms and she is also in agreement with the plan of discharge and going for HD today.  She is being discharged in stable condition.  No change in medications.   Discharge Diagnoses:  Active Problems:   Hyperkalemia    Discharge Instructions  Discharge Instructions    Discharge patient   Complete by: As directed    Discharge disposition: 01-Home or Self Care   Discharge patient date:  12/14/2018     Allergies as of 12/14/2018      Reactions   Morphine Itching, Other (See Comments)   Hallucination   Sulfamethoxazole-trimethoprim Hives   Trulicity [dulaglutide] Other (See Comments)   She is DM Type 1. Was prescribed but damaged her kidneys      Medication List    TAKE these medications   amLODipine 10 MG tablet Commonly known as: NORVASC Take 10 mg by mouth every evening.   calcium acetate 667 MG tablet Commonly known as: PHOSLO Take 1,334 mg by mouth 3 (three) times daily with meals.   cloNIDine 0.1 mg/24hr patch Commonly known as: CATAPRES - Dosed in mg/24 hr Place 0.1 mg onto the skin See admin instructions. Apply patch on every Sunday   cyclobenzaprine 10 MG tablet Commonly known as: FLEXERIL Take 10 mg by mouth at bedtime as needed for muscle spasms.   docusate sodium 100 MG capsule Commonly known as: COLACE Take 100 mg by mouth 2 (two) times daily.   gentamicin cream 0.1 % Commonly known as: GARAMYCIN Apply 1 application topically See admin instructions. Apply small amount to catheter exit site once daily   insulin pump Soln Inject 1 each into the skin continuous. Novolog. Patient stated she is not sure how much insulin is given into the pump   lactulose 10 GM/15ML solution Commonly known as: CHRONULAC Take 20 g by mouth at bedtime as needed (constipation).   metoprolol succinate 100 MG 24 hr tablet Commonly known as: TOPROL-XL Take 100 mg by mouth 2 (two) times daily.   olmesartan 40 MG tablet Commonly known as: [employer]  Take 40 mg by mouth every morning.   paricalcitol 1 MCG capsule Commonly known as: ZEMPLAR Take 1 mcg by mouth daily.   [place] Caps 1 MG Caps Take 1 mg by mouth daily.      Follow-up Information    Rubie Maid, MD Follow up in 1 week(s).   Specialties: Obstetrics and Gynecology, Radiology Contact information: [place] [address] [phone]          Allergies  Allergen  Reactions  . Morphine Itching and Other (See Comments)    Hallucination  . Sulfamethoxazole-Trimethoprim Hives  . Trulicity [Dulaglutide] Other (See Comments)    She is DM Type 1. Was prescribed but damaged her kidneys    Consultations: Nephrology and IR   Procedures/Studies: Ct Abdomen Pelvis Wo Contrast  Result Date: 11/25/2018 CLINICAL DATA:  Abdominal pain, constipation, incomplete dialysis EXAM: CT ABDOMEN AND PELVIS WITHOUT CONTRAST TECHNIQUE: Multidetector CT imaging of the abdomen and pelvis was performed following the standard protocol without IV contrast. COMPARISON:  Abdominal radiograph, 11/24/2018, CT abdomen pelvis, 09/02/2018 FINDINGS: Lower chest: No acute abnormality. Hepatobiliary: No solid liver abnormality is seen. No gallstones, gallbladder wall thickening, or biliary dilatation. Pancreas: Unremarkable. No pancreatic ductal dilatation or surrounding inflammatory changes. Spleen: Normal in size without significant abnormality. Adrenals/Urinary Tract: Adrenal glands are unremarkable. Kidneys are normal, without renal calculi, solid lesion, or hydronephrosis. Thickening of the urinary bladder. Stomach/Bowel: Stomach is within normal limits. Appendix appears normal. No evidence of bowel wall thickening, distention, or inflammatory changes. Scattered stool in the colon. Vascular/Lymphatic: No significant vascular findings are present. No enlarged abdominal or pelvic lymph nodes. Reproductive: No mass or other significant abnormality. Other: No abdominal wall hernia or abnormality. No abdominopelvic ascites. Peritoneal dialysis catheter is positioned with tip in the central low abdomen. Musculoskeletal: No acute or significant osseous findings. IMPRESSION: 1.  No large burden of stool in the colon. 2. Peritoneal dialysis catheter is positioned with tip in the central low abdomen. 3. Thickening of the urinary bladder, nonspecific. Correlate with urinalysis. Electronically Signed   By:  Eddie Candle M.D.   On: 11/25/2018 13:20   Dg Abd 1 View  Result Date: 11/24/2018 CLINICAL DATA:  Peritoneal dialysis catheter.  Constipation. EXAM: ABDOMEN - 1 VIEW COMPARISON:  CT 09/02/2018 FINDINGS: Interval placement of catheter within the right hemiabdomen with distal tip coiled in left hemipelvis. No evidence of catheter kinking or discontinuity. Mild colonic stool burden. No gross free intraperitoneal air. Osseous structures are intact and unremarkable. IMPRESSION: Mild colonic stool burden. Electronically Signed   By: Davina Poke M.D.   On: 11/24/2018 10:20   Ir Fluoro Guide Cv Line Right  Result Date: 12/13/2018 INDICATION: 33 year old female with displaced right IJ tunneled hemodialysis catheter, referred for placement of new catheter. EXAM: ULTRASOUND-GUIDED ACCESS RIGHT INTERNAL JUGULAR VEIN PLACEMENT OF TUNNELED HEMODIALYSIS CATHETER MEDICATIONS: 2 g Ancef; The antibiotic was administered within an appropriate time interval prior to skin puncture. ANESTHESIA/SEDATION: Moderate (conscious) sedation was employed during this procedure. A total of Versed 2.0 mg and Fentanyl 100 mcg was administered intravenously. Moderate Sedation Time: 18 minutes. The patient's level of consciousness and vital signs were monitored continuously by radiology nursing throughout the procedure under my direct supervision. FLUOROSCOPY TIME:  Fluoroscopy Time: 0 minutes 6 seconds (1 mGy). COMPLICATIONS: None immediate. PROCEDURE: Informed written consent was obtained from the patient after a discussion of the risks, benefits, and alternatives to treatment. Questions regarding the procedure were encouraged and answered. The right  neck and chest were prepped with chlorhexidine in a sterile fashion, and a sterile drape was applied covering the operative field. Maximum barrier sterile technique with sterile gowns and gloves were used for the procedure. A timeout was performed prior to the initiation of the procedure.  After creating a small venotomy incision, a micropuncture kit was utilized to access the right internal jugular vein under direct, real-time ultrasound guidance after the overlying soft tissues were anesthetized with 1% lidocaine with epinephrine. Ultrasound image documentation was performed. The microwire was marked to measure appropriate internal catheter length. External tunneled length was estimated. A total tip to cuff length of 19 cm was selected. Skin and subcutaneous tissues of chest wall below the clavicle were generously infiltrated with 1% lidocaine for local anesthesia. A small stab incision was made with 11 blade scalpel. The selected hemodialysis catheter was tunneled in a retrograde fashion from the anterior chest wall to the venotomy incision. A guidewire was advanced to the level of the IVC and the micropuncture sheath was exchanged for a peel-away sheath. The catheter was then placed through the peel-away sheath with tips ultimately positioned within the superior aspect of the right atrium. Final catheter positioning was confirmed and documented with a spot radiographic image. The catheter aspirates and flushes normally. The catheter was flushed with appropriate volume heparin dwells. Gel-Foam slurry was administered into the catheter tract. The catheter exit site was secured with a 0-Prolene retention suture. The venotomy incision was closed Derma bond and sterile dressing. Dressings were applied at the chest wall. Patient tolerated the procedure well and remained hemodynamically stable throughout. No complications were encountered and no significant blood loss encountered. IMPRESSION: Status post right IJ tunneled hemodialysis catheter. Signed, Dulcy Fanny. Dellia Nims, RPVI [facility] Electronically Signed   By: Corrie Mckusick D.O.   On: 12/13/2018 17:14   Ir Fluoro Guide Cv Line Right  Result Date: 12/05/2018 INDICATION: 33 year old with  end-stage disease and issues with the peritoneal dialysis catheter. Request for hemodialysis catheter. EXAM: FLUOROSCOPIC AND ULTRASOUND GUIDED PLACEMENT OF A TUNNELED DIALYSIS CATHETER Physician: Stephan Minister. Anselm Pancoast, MD MEDICATIONS: Ancef 2 g; The antibiotic was administered within an appropriate time interval prior to skin puncture. ANESTHESIA/SEDATION: Versed 2.0 mg IV; Fentanyl 100 mcg IV; Moderate Sedation Time:  22 minutes The patient was continuously monitored during the procedure by the interventional radiology nurse under my direct supervision. FLUOROSCOPY TIME:  Fluoroscopy Time: 30 seconds, 2 mGy COMPLICATIONS: None immediate. PROCEDURE: Informed consent was obtained for placement of a tunneled dialysis catheter. The patient was placed supine on the interventional table. Ultrasound confirmed a patent right internal jugular vein. Ultrasound images were obtained for documentation. The right side of the neck and chest was prepped and draped in a sterile fashion. The right neck was anesthetized with 1% lidocaine. Maximal barrier sterile technique was utilized including caps, mask, sterile gowns, sterile gloves, sterile drape, hand hygiene and skin antiseptic. A small incision was made with #11 blade scalpel. A 21 gauge needle directed into the right internal jugular vein with ultrasound guidance. A micropuncture dilator set was placed. A 19 cm tip to cuff Palindrome catheter was selected. The skin below the right clavicle was anesthetized and a small incision was made with an #11 blade scalpel. A subcutaneous tunnel was formed to the vein dermatotomy site. The catheter was brought through the tunnel. The vein dermatotomy site was dilated to accommodate a peel-away sheath. The catheter was placed through the peel-away sheath and  directed into the central venous structures. The tip of the catheter was placed in the SVC and right atrium junction with fluoroscopy. Fluoroscopic images were obtained for documentation.  Both lumens were found to aspirate and flush well. The proper amount of heparin was flushed in both lumens. The vein dermatotomy site was closed using a single layer of absorbable suture and Dermabond. Gel-Foam was placed in subcutaneous tract. The catheter was secured to the skin using Prolene suture. IMPRESSION: Successful placement of a right jugular tunneled dialysis catheter using ultrasound and fluoroscopic guidance. Electronically Signed   By: Markus Daft M.D.   On: 12/05/2018 14:36   Ir US Guide Vasc Access Right  Result Date: 12/13/2018 INDICATION: 33 year old female with displaced right IJ tunneled hemodialysis catheter, referred for placement of new catheter. EXAM: ULTRASOUND-GUIDED ACCESS RIGHT INTERNAL JUGULAR VEIN PLACEMENT OF TUNNELED HEMODIALYSIS CATHETER MEDICATIONS: 2 g Ancef; The antibiotic was administered within an appropriate time interval prior to skin puncture. ANESTHESIA/SEDATION: Moderate (conscious) sedation was employed during this procedure. A total of Versed 2.0 mg and Fentanyl 100 mcg was administered intravenously. Moderate Sedation Time: 18 minutes. The patient's level of consciousness and vital signs were monitored continuously by radiology nursing throughout the procedure under my direct supervision. FLUOROSCOPY TIME:  Fluoroscopy Time: 0 minutes 6 seconds (1 mGy). COMPLICATIONS: None immediate. PROCEDURE: Informed written consent was obtained from the patient after a discussion of the risks, benefits, and alternatives to treatment. Questions regarding the procedure were encouraged and answered. The right neck and chest were prepped with chlorhexidine in a sterile fashion, and a sterile drape was applied covering the operative field. Maximum barrier sterile technique with sterile gowns and gloves were used for the procedure. A timeout was performed prior to the initiation of the procedure. After creating a small venotomy incision, a micropuncture kit was utilized to access the  right internal jugular vein under direct, real-time ultrasound guidance after the overlying soft tissues were anesthetized with 1% lidocaine with epinephrine. Ultrasound image documentation was performed. The microwire was marked to measure appropriate internal catheter length. External tunneled length was estimated. A total tip to cuff length of 19 cm was selected. Skin and subcutaneous tissues of chest wall below the clavicle were generously infiltrated with 1% lidocaine for local anesthesia. A small stab incision was made with 11 blade scalpel. The selected hemodialysis catheter was tunneled in a retrograde fashion from the anterior chest wall to the venotomy incision. A guidewire was advanced to the level of the IVC and the micropuncture sheath was exchanged for a peel-away sheath. The catheter was then placed through the peel-away sheath with tips ultimately positioned within the superior aspect of the right atrium. Final catheter positioning was confirmed and documented with a spot radiographic image. The catheter aspirates and flushes normally. The catheter was flushed with appropriate volume heparin dwells. Gel-Foam slurry was administered into the catheter tract. The catheter exit site was secured with a 0-Prolene retention suture. The venotomy incision was closed Derma bond and sterile dressing. Dressings were applied at the chest wall. Patient tolerated the procedure well and remained hemodynamically stable throughout. No complications were encountered and no significant blood loss encountered. IMPRESSION: Status post right IJ tunneled hemodialysis catheter. Signed, Dulcy Fanny. Dellia Nims, RPVI [facility] Radiology Electronically Signed   By: Corrie Mckusick D.O.   On: 12/13/2018 17:14   Ir US Guide Vasc Access Right  Result Date: 12/05/2018 INDICATION: 33 year old with end-stage disease and issues with the peritoneal dialysis  catheter. Request for  hemodialysis catheter. EXAM: FLUOROSCOPIC AND ULTRASOUND GUIDED PLACEMENT OF A TUNNELED DIALYSIS CATHETER Physician: Stephan Minister. Anselm Pancoast, MD MEDICATIONS: Ancef 2 g; The antibiotic was administered within an appropriate time interval prior to skin puncture. ANESTHESIA/SEDATION: Versed 2.0 mg IV; Fentanyl 100 mcg IV; Moderate Sedation Time:  22 minutes The patient was continuously monitored during the procedure by the interventional radiology nurse under my direct supervision. FLUOROSCOPY TIME:  Fluoroscopy Time: 30 seconds, 2 mGy COMPLICATIONS: None immediate. PROCEDURE: Informed consent was obtained for placement of a tunneled dialysis catheter. The patient was placed supine on the interventional table. Ultrasound confirmed a patent right internal jugular vein. Ultrasound images were obtained for documentation. The right side of the neck and chest was prepped and draped in a sterile fashion. The right neck was anesthetized with 1% lidocaine. Maximal barrier sterile technique was utilized including caps, mask, sterile gowns, sterile gloves, sterile drape, hand hygiene and skin antiseptic. A small incision was made with #11 blade scalpel. A 21 gauge needle directed into the right internal jugular vein with ultrasound guidance. A micropuncture dilator set was placed. A 19 cm tip to cuff Palindrome catheter was selected. The skin below the right clavicle was anesthetized and a small incision was made with an #11 blade scalpel. A subcutaneous tunnel was formed to the vein dermatotomy site. The catheter was brought through the tunnel. The vein dermatotomy site was dilated to accommodate a peel-away sheath. The catheter was placed through the peel-away sheath and directed into the central venous structures. The tip of the catheter was placed in the SVC and right atrium junction with fluoroscopy. Fluoroscopic images were obtained for documentation. Both lumens were found to aspirate and flush well. The proper amount of heparin was  flushed in both lumens. The vein dermatotomy site was closed using a single layer of absorbable suture and Dermabond. Gel-Foam was placed in subcutaneous tract. The catheter was secured to the skin using Prolene suture. IMPRESSION: Successful placement of a right jugular tunneled dialysis catheter using ultrasound and fluoroscopic guidance. Electronically Signed   By: Markus Daft M.D.   On: 12/05/2018 14:36      Discharge Exam: Vitals:   12/13/18 2340 12/14/18 0400  BP:  (!) 167/89  Pulse:  88  Resp:  18  Temp:  98.8 F (37.1 C)  SpO2: 100% 100%   Vitals:   12/13/18 2200 12/13/18 2235 12/13/18 2340 12/14/18 0400  BP: (!) 171/87 (!) 194/74  (!) 167/89  Pulse: 92 (!) 104  88  Resp: '16 18  18  ' Temp: 98 F (36.7 C) 98.9 F (37.2 C)  98.8 F (37.1 C)  TempSrc: Oral     SpO2: 100% 100% 100% 100%  Weight: 80.9 kg       General: Pt is alert, awake, not in acute distress Cardiovascular: RRR, S1/S2 +, no rubs, no gallops Respiratory: CTA bilaterally, no wheezing, no rhonchi Abdominal: Soft, NT, ND, bowel sounds + Extremities: no edema, no cyanosis    The results of significant diagnostics from this hospitalization (including imaging, microbiology, ancillary and laboratory) are listed below for reference.     Microbiology: Recent Results (from the past 240 hour(s))  SARS Coronavirus 2 [facility] order, Performed in [facility] hospital lab) Nasopharyngeal Nasopharyngeal Swab     Status: None   Collection Time: 12/13/18  5:46 PM   Specimen: Nasopharyngeal Swab  Result Value Ref Range Status   SARS Coronavirus 2 NEGATIVE NEGATIVE Final    Comment: (NOTE) If result  is NEGATIVE SARS-CoV-2 target nucleic acids are NOT DETECTED. The SARS-CoV-2 RNA is generally detectable in upper and lower  respiratory specimens during the acute phase of infection. The lowest  concentration of SARS-CoV-2 viral copies this assay can detect is 250  copies / mL. A negative result does not preclude  SARS-CoV-2 infection  and should not be used as the sole basis for treatment or other  patient management decisions.  A negative result may occur with  improper specimen collection / handling, submission of specimen other  than nasopharyngeal swab, presence of viral mutation(s) within the  areas targeted by this assay, and inadequate number of viral copies  (<250 copies / mL). A negative result must be combined with clinical  observations, patient history, and epidemiological information. If result is POSITIVE SARS-CoV-2 target nucleic acids are DETECTED. The SARS-CoV-2 RNA is generally detectable in upper and lower  respiratory specimens dur ing the acute phase of infection.  Positive  results are indicative of active infection with SARS-CoV-2.  Clinical  correlation with patient history and other diagnostic information is  necessary to determine patient infection status.  Positive results do  not rule out bacterial infection or co-infection with other viruses. If result is PRESUMPTIVE POSTIVE SARS-CoV-2 nucleic acids MAY BE PRESENT.   A presumptive positive result was obtained on the submitted specimen  and confirmed on repeat testing.  While 2019 novel coronavirus  (SARS-CoV-2) nucleic acids may be present in the submitted sample  additional confirmatory testing may be necessary for epidemiological  and / or clinical management purposes  to differentiate between  SARS-CoV-2 and other Sarbecovirus currently known to infect humans.  If clinically indicated additional testing with an alternate test  methodology [phone]) is advised. The SARS-CoV-2 RNA is generally  detectable in upper and lower respiratory sp ecimens during the acute  phase of infection. The expected result is Negative. Fact Sheet for Patients:  StrictlyIdeas.no Fact Sheet for Healthcare Providers: BankingDealers.co.za This test is not yet approved or cleared by the  [place] FDA and has been authorized for detection and/or diagnosis of SARS-CoV-2 by FDA under an Emergency Use Authorization (EUA).  This EUA will remain in effect (meaning this test can be used) for the duration of the COVID-19 declaration under Section 564(b)(1) of the Act, 21 U.S.C. section 360bbb-3(b)(1), unless the authorization is terminated or revoked sooner. Performed at [facility], [address], [place],  [address]      Labs: BNP (last 3 results) No results for input(s): BNP in the last 8760 hours. Basic Metabolic Panel: Recent Labs  Lab 12/13/18 1406 12/13/18 1959  NA 131* 134*  K 6.7* 4.9  CL 94* 97*  CO2 20* 21*  GLUCOSE 369* 177*  BUN 70* 71*  CREATININE 14.04* 13.64*  CALCIUM 8.7* 8.7*  PHOS  --  6.8*   Liver Function Tests: Recent Labs  Lab 12/13/18 1959  ALBUMIN 3.4*   No results for input(s): LIPASE, AMYLASE in the last 168 hours. No results for input(s): AMMONIA in the last 168 hours. CBC: Recent Labs  Lab 12/13/18 1406  WBC 4.8  HGB 9.4*  HCT 29.5*  MCV 85.8  PLT 283   Cardiac Enzymes: No results for input(s): CKTOTAL, CKMB, CKMBINDEX, TROPONINI in the last 168 hours. BNP: Invalid input(s): POCBNP CBG: Recent Labs  Lab 12/13/18 1751 12/13/18 2238 12/14/18 0438 12/14/18 0545  GLUCAP 77 197* 69* 90   D-Dimer No results for input(s): DDIMER in the last 72 hours. Hgb  A1c No results for input(s): HGBA1C in the last 72 hours. Lipid Profile No results for input(s): CHOL, HDL, LDLCALC, TRIG, CHOLHDL, LDLDIRECT in the last 72 hours. Thyroid function studies No results for input(s): TSH, T4TOTAL, T3FREE, THYROIDAB in the last 72 hours.  Invalid input(s): FREET3 Anemia work up No results for input(s): VITAMINB12, FOLATE, FERRITIN, TIBC, IRON, RETICCTPCT in the last 72 hours. Urinalysis    Component Value Date/Time   COLORURINE STRAW (A) 11/25/2018 1224   APPEARANCEUR CLEAR 11/25/2018 1224   APPEARANCEUR Clear  06/25/2017 1042   LABSPEC 1.010 11/25/2018 1224   PHURINE 7.0 11/25/2018 1224   GLUCOSEU 50 (A) 11/25/2018 1224   HGBUR MODERATE (A) 11/25/2018 1224   BILIRUBINUR NEGATIVE 11/25/2018 1224   BILIRUBINUR neg 08/05/2017 1514   BILIRUBINUR Negative 06/25/2017 1042   KETONESUR NEGATIVE 11/25/2018 1224   PROTEINUR >=300 (A) 11/25/2018 1224   UROBILINOGEN 0.2 08/05/2017 1514   NITRITE NEGATIVE 11/25/2018 1224   LEUKOCYTESUR NEGATIVE 11/25/2018 1224   Sepsis Labs Invalid input(s): PROCALCITONIN,  WBC,  LACTICIDVEN Microbiology Recent Results (from the past 240 hour(s))  SARS Coronavirus 2 [facility] order, Performed in [facility] hospital lab) Nasopharyngeal Nasopharyngeal Swab     Status: None   Collection Time: 12/13/18  5:46 PM   Specimen: Nasopharyngeal Swab  Result Value Ref Range Status   SARS Coronavirus 2 NEGATIVE NEGATIVE Final    Comment: (NOTE) If result is NEGATIVE SARS-CoV-2 target nucleic acids are NOT DETECTED. The SARS-CoV-2 RNA is generally detectable in upper and lower  respiratory specimens during the acute phase of infection. The lowest  concentration of SARS-CoV-2 viral copies this assay can detect is 250  copies / mL. A negative result does not preclude SARS-CoV-2 infection  and should not be used as the sole basis for treatment or other  patient management decisions.  A negative result may occur with  improper specimen collection / handling, submission of specimen other  than nasopharyngeal swab, presence of viral mutation(s) within the  areas targeted by this assay, and inadequate number of viral copies  (<250 copies / mL). A negative result must be combined with clinical  observations, patient history, and epidemiological information. If result is POSITIVE SARS-CoV-2 target nucleic acids are DETECTED. The SARS-CoV-2 RNA is generally detectable in upper and lower  respiratory specimens dur ing the acute phase of infection.  Positive  results are indicative of  active infection with SARS-CoV-2.  Clinical  correlation with patient history and other diagnostic information is  necessary to determine patient infection status.  Positive results do  not rule out bacterial infection or co-infection with other viruses. If result is PRESUMPTIVE POSTIVE SARS-CoV-2 nucleic acids MAY BE PRESENT.   A presumptive positive result was obtained on the submitted specimen  and confirmed on repeat testing.  While 2019 novel coronavirus  (SARS-CoV-2) nucleic acids may be present in the submitted sample  additional confirmatory testing may be necessary for epidemiological  and / or clinical management purposes  to differentiate between  SARS-CoV-2 and other Sarbecovirus currently known to infect humans.  If clinically indicated additional testing with an alternate test  methodology [phone]) is advised. The SARS-CoV-2 RNA is generally  detectable in upper and lower respiratory sp ecimens during the acute  phase of infection. The expected result is Negative. Fact Sheet for Patients:  StrictlyIdeas.no Fact Sheet for Healthcare Providers: BankingDealers.co.za This test is not yet approved or cleared by the [place] FDA and has been authorized for detection and/or diagnosis of SARS-CoV-2  by FDA under an Emergency Use Authorization (EUA).  This EUA will remain in effect (meaning this test can be used) for the duration of the COVID-19 declaration under Section 564(b)(1) of the Act, 21 U.S.C. section 360bbb-3(b)(1), unless the authorization is terminated or revoked sooner. Performed at [facility], [place] [address] [place], [place] [address]      Time coordinating discharge: 30 minutes  SIGNED:   Darliss Cheney, MD  [facility] 12/14/2018, 9:42 AM Pager [phone]  If 7PM-7AM, please contact night-coverage [email or web address] Password [email or web address]

## 2018-12-14 NOTE — Progress Notes (Signed)
Renal Navigator spoke with Nephrologist/Dr. Jonnie Finner and Attending/Dr. Doristine Bosworth who both state patient is cleared for discharge today.  Renal Navigator met with patient to discuss returning to OP HD clinic/[facility] today after discharge. She states her husband will come get her from the hospital and take her to her HD appointment. She reports no barriers or needs from Renal Navigator.   Alphonzo Cruise, [place] Renal Navigator [phone]

## 2019-01-15 ENCOUNTER — Encounter: Payer: Self-pay | Admitting: Internal Medicine

## 2019-01-15 ENCOUNTER — Other Ambulatory Visit: Payer: Self-pay

## 2019-01-15 NOTE — Progress Notes (Unsigned)
Patient stated that her appetite is not so good. Patient stated that she just doesn't get hungry and is not able to take any supplements like ensures since she is lactose intolerance.

## 2019-01-16 ENCOUNTER — Inpatient Hospital Stay: Payer: Medicare Other | Admitting: Internal Medicine

## 2019-01-16 ENCOUNTER — Inpatient Hospital Stay: Payer: Medicare Other

## 2019-01-16 NOTE — Progress Notes (Unsigned)
[facility] NOTE  Patient Care Team: Rubie Maid, MD as PCP - General (Obstetrics and Gynecology) [name], FNP as Consulting Physician (Family Medicine) Azzie Glatter, MD as Consulting Physician (Internal Medicine) Sherryll Burger, MD as Consulting Physician (Nephrology)  CHIEF COMPLAINTS/PURPOSE OF CONSULTATION: Atypical HUS  # ATYPICAL HEMOLYTIC UREMIC SYNDROME [April 2019-Dx; [facility]; Dr.Arepally;s/p Plex x1; April 26th- Ecluzimab 1200 mg q 2W; march 25th 2020- Ultomiris q  8 weeks.   # AKI/CKD [name] 2018;FEB 2020- hemodialysis [[facility] in [place]; Tuesday Thursday Saturday]; May 2020-peritoneal dialysis  # IDDM [since age of 45- Dr.Kerr,/Endo]  # WORK UP for HUS [[facility]] Phospholipase A2 Receptor AB/ S Phospholipase A2 Receptor IFA-NEGATIVE   Oncology History   No history exists.     HISTORY OF PRESENTING ILLNESS:  Kelly Huff 33 y.o.  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  In the interim patient has been transitioned to peritoneal dialysis.  She had a hemodialysis catheter taken out.  She notes her improvement of her quality of life.  She denies any headache.  Denies any repeat episodes of mental status changes or admission to hospital.  No nausea no vomiting no diarrhea.  Review of Systems  Constitutional: Positive for malaise/fatigue. Negative for chills, diaphoresis, fever and weight loss.  HENT: Negative for nosebleeds and sore throat.   Eyes: Negative for double vision.  Respiratory: Negative for cough, hemoptysis, sputum production, shortness of breath and wheezing.   Cardiovascular: Negative for chest pain, palpitations, orthopnea and leg swelling.  Gastrointestinal: Negative for abdominal pain, blood in stool, constipation, diarrhea, heartburn, melena, nausea and vomiting.  Genitourinary: Negative for dysuria, frequency and urgency.  Musculoskeletal: Negative  for back pain and joint pain.  Skin: Negative.  Negative for itching and rash.  Neurological: Negative for dizziness, tingling, focal weakness, weakness and headaches.  Endo/Heme/Allergies: Does not bruise/bleed easily.  Psychiatric/Behavioral: Negative for depression. The patient is not nervous/anxious and does not have insomnia.     MEDICAL HISTORY:  Past Medical History:  Diagnosis Date  . Chronic kidney disease   . Diabetes mellitus without complication ([place])   . History of pre-eclampsia 2016   mild  . Hypertension     SURGICAL HISTORY: Past Surgical History:  Procedure Laterality Date  . CESAREAN SECTION    . DILATION AND CURETTAGE OF UTERUS     x 4  . IR FLUORO GUIDE CV LINE RIGHT  12/05/2018  . IR FLUORO GUIDE CV LINE RIGHT  12/13/2018  . IR US GUIDE VASC ACCESS RIGHT  12/05/2018  . IR US GUIDE VASC ACCESS RIGHT  12/13/2018    SOCIAL HISTORY: Social History   Socioeconomic History  . Marital status: Married    Spouse name: Not on file  . Number of children: Not on file  . Years of education: Not on file  . Highest education level: Not on file  Occupational History  . Not on file  Social Needs  . Financial resource strain: Not on file  . Food insecurity    Worry: Not on file    Inability: Not on file  . Transportation needs    Medical: Not on file    Non-medical: Not on file  Tobacco Use  . Smoking status: Never Smoker  . Smokeless tobacco: Never Used  Substance and Sexual Activity  . Alcohol use: No  . Drug use: No  . Sexual activity: Yes    Birth control/protection: None, Surgical  Comment: tubial  Lifestyle  . Physical activity    Days per week: Not on file    Minutes per session: Not on file  . Stress: Not on file  Relationships  . Social Herbalist on phone: Not on file    Gets together: Not on file    Attends religious service: Not on file    Active member of club or organization: Not on file    Attends meetings of clubs or  organizations: Not on file    Relationship status: Not on file  . Intimate partner violence    Fear of current or ex partner: Not on file    Emotionally abused: Not on file    Physically abused: Not on file    Forced sexual activity: Not on file  Other Topics Concern  . Not on file  Social History Narrative  . Not on file    FAMILY HISTORY: Family History  Problem Relation Age of Onset  . Hypertension Mother   . Cancer Mother        Uterine vs cervical (pt unsure),   . Schizophrenia Brother   . Breast cancer Neg Hx     ALLERGIES:  is allergic to morphine; sulfamethoxazole-trimethoprim; and trulicity [dulaglutide].  MEDICATIONS:  Current Outpatient Medications  Medication Sig Dispense Refill  . amLODipine (NORVASC) 10 MG tablet Take 10 mg by mouth every evening.     . calcium acetate (PHOSLO) 667 MG tablet Take 1,334 mg by mouth 3 (three) times daily with meals.    . cloNIDine (CATAPRES - DOSED IN MG/24 HR) 0.1 mg/24hr patch Place 0.1 mg onto the skin See admin instructions. Apply patch on every Sunday    . cyclobenzaprine (FLEXERIL) 10 MG tablet Take 10 mg by mouth at bedtime as needed for muscle spasms.     Marland Kitchen docusate sodium (COLACE) 100 MG capsule Take 100 mg by mouth 2 (two) times daily.    Marland Kitchen gentamicin cream (GARAMYCIN) 0.1 % Apply 1 application topically See admin instructions. Apply small amount to catheter exit site once daily    . Insulin Human (INSULIN PUMP) SOLN Inject 1 each into the skin continuous. Novolog. Patient stated she is not sure how much insulin is given into the pump    . metoprolol succinate (TOPROL-XL) 100 MG 24 hr tablet Take 100 mg by mouth 2 (two) times daily.    Marland Kitchen olmesartan (BENICAR) 40 MG tablet Take 40 mg by mouth every morning.    . paricalcitol (ZEMPLAR) 1 MCG capsule Take 1 mcg by mouth daily.    . B Complex-C-Folic Acid (RENO CAPS) 1 MG CAPS Take 1 mg by mouth daily.    Marland Kitchen lactulose (CHRONULAC) 10 GM/15ML solution Take 20 g by mouth at bedtime  as needed (constipation).      No current facility-administered medications for this visit.       Marland Kitchen  PHYSICAL EXAMINATION: ECOG PERFORMANCE STATUS: 1 - Symptomatic but completely ambulatory  There were no vitals filed for this visit. There were no vitals filed for this visit.  Physical Exam  Constitutional: She is oriented to person, place, and time and well-developed, well-nourished, and in no distress.  Patient is alone.  HENT:  Head: Normocephalic and atraumatic.  Mouth/Throat: Oropharynx is clear and moist. No oropharyngeal exudate.  Eyes: Pupils are equal, round, and reactive to light.  Neck: Normal range of motion. Neck supple.  Cardiovascular: Normal rate and regular rhythm.  Pulmonary/Chest: No respiratory distress. She has  no wheezes.  Abdominal: Soft. Bowel sounds are normal. She exhibits no distension and no mass. There is no abdominal tenderness. There is no rebound and no guarding.  Musculoskeletal: Normal range of motion.        General: No tenderness or edema.  Neurological: She is alert and oriented to person, place, and time.  Skin: Skin is warm.  Psychiatric: Affect normal.  ;  LABORATORY DATA:  I have reviewed the data as listed Lab Results  Component Value Date   WBC 4.8 12/13/2018   HGB 9.4 (L) 12/13/2018   HCT 29.5 (L) 12/13/2018   MCV 85.8 12/13/2018   PLT 283 12/13/2018   Recent Labs    09/03/18 0447  11/21/18 0906 11/25/18 0921  12/04/18 0555 12/05/18 0409 12/05/18 1754 12/13/18 1406 12/13/18 1959 12/14/18 0820  NA 130*   < > 135 137  --  138 136 135 131* 134* 133*  K 4.7   < > 4.8 5.2*  --  4.5 4.9 4.7 6.7* 4.9 3.9  CL 90*   < > 96* 103  --  101 100 100 94* 97* 95*  CO2 19*   < > 24 20*  --  18* 19* 19* 20* 21* 26  GLUCOSE 381*   < > 147* 123*  --  120* 160* 207* 369* 177* 207*  BUN 33*   < > 82* 80*  --  84* 85* 87* 70* 71* 27*  CREATININE 8.87*   < > 15.75* 17.46*   < > 15.96* 16.15* 16.89* 14.04* 13.64* 8.03*  CALCIUM 9.8   < >  8.3* 7.6*  --  6.4* 6.7* 6.7* 8.7* 8.7* 8.0*  GFRNONAA 5*   < > 3* 2*   < > 3* 3* 2* 3* 3* 6*  GFRAA 6*   < > 3* 3*   < > 3* 3* 3* 3* 4* 7*  PROT 7.2   < > 7.0 6.3*  --  5.3*  --   --   --   --   --   ALBUMIN 3.6   < > 3.3* 3.0*  --  2.6* 2.7* 2.6*  --  3.4*  --   AST 16   < > 14* 14*  --  12*  --   --   --   --   --   ALT 13   < > 13 10  --  5  --   --   --   --   --   ALKPHOS 130*   < > 99 101  --  77  --   --   --   --   --   BILITOT 0.5   < > 0.6 0.4  --  0.5  --   --   --   --   --   BILIDIR <0.1  --   --   --   --   --   --   --   --   --   --   IBILI NOT CALCULATED  --   --   --   --   --   --   --   --   --   --    < > = values in this interval not displayed.    RADIOGRAPHIC STUDIES: I have personally reviewed the radiological images as listed and agreed with the findings in the report. No results found.  ASSESSMENT & PLAN:   HUS (hemolytic uremic syndrome), atypical (  [place]) # Atypical hemolytic uremic syndrome -on Ultimoris infusions. Hb ~9.  /Slightly worsened.  LDH- normal. Discussed re: Epo injections; will order iron studies/ferritin/haptoglobin at next visit.  If low will restart erythropoietin injections.  Patient received IV iron in June with nephrology.  # ESRD- on PD; stable.   # Low calcium- 8.3- recommend ca+vit D BID.   #Hypertension-improved/ stable.   # DISPOSITION: # Treatment today # follow up in 8 weeks- MD- labs-cbc.cmp/ldh; haptoglobin; iron studies/ferritin- Ultomiris-Dr.B  All questions were answered. The patient knows to call the clinic with any problems, questions or concerns.    Cammie Sickle, MD 01/16/2019 7:59 AM

## 2019-01-16 NOTE — Assessment & Plan Note (Signed)
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 Hb ~9.  /Slightly worsened.  LDH- normal. Discussed re: Epo injections; will order iron studies/ferritin/haptoglobin at next visit.  If low will restart erythropoietin injections.  Patient received IV iron in June with nephrology.  # ESRD- on PD; stable.   # Low calcium- 8.3- recommend ca+vit D BID.   #Hypertension-improved/ stable.   # DISPOSITION: # Treatment today # follow up in 8 weeks- MD- labs-cbc.cmp/ldh; haptoglobin; iron studies/ferritin- Ultomiris-Dr.B 

## 2019-01-17 ENCOUNTER — Inpatient Hospital Stay: Payer: Medicare Other | Attending: Internal Medicine | Admitting: *Deleted

## 2019-01-17 ENCOUNTER — Other Ambulatory Visit: Payer: Self-pay | Admitting: *Deleted

## 2019-01-17 ENCOUNTER — Encounter: Payer: Self-pay | Admitting: Internal Medicine

## 2019-01-17 ENCOUNTER — Inpatient Hospital Stay (HOSPITAL_BASED_OUTPATIENT_CLINIC_OR_DEPARTMENT_OTHER): Payer: Medicare Other | Admitting: Internal Medicine

## 2019-01-17 ENCOUNTER — Inpatient Hospital Stay: Payer: Medicare Other

## 2019-01-17 ENCOUNTER — Other Ambulatory Visit: Payer: Self-pay

## 2019-01-17 DIAGNOSIS — Z95828 Presence of other vascular implants and grafts: Secondary | ICD-10-CM

## 2019-01-17 DIAGNOSIS — D5939 Other hemolytic-uremic syndrome: Secondary | ICD-10-CM

## 2019-01-17 DIAGNOSIS — Z5111 Encounter for antineoplastic chemotherapy: Secondary | ICD-10-CM | POA: Diagnosis present

## 2019-01-17 DIAGNOSIS — I120 Hypertensive chronic kidney disease with stage 5 chronic kidney disease or end stage renal disease: Secondary | ICD-10-CM | POA: Diagnosis not present

## 2019-01-17 DIAGNOSIS — N186 End stage renal disease: Secondary | ICD-10-CM | POA: Insufficient documentation

## 2019-01-17 DIAGNOSIS — D593 Hemolytic-uremic syndrome: Secondary | ICD-10-CM | POA: Insufficient documentation

## 2019-01-17 DIAGNOSIS — Z79899 Other long term (current) drug therapy: Secondary | ICD-10-CM | POA: Diagnosis not present

## 2019-01-17 DIAGNOSIS — Z7682 Awaiting organ transplant status: Secondary | ICD-10-CM | POA: Insufficient documentation

## 2019-01-17 LAB — CBC WITH DIFFERENTIAL/PLATELET
Abs Immature Granulocytes: 0.01 10*3/uL (ref 0.00–0.07)
Basophils Absolute: 0.1 10*3/uL (ref 0.0–0.1)
Basophils Relative: 1 %
Eosinophils Absolute: 0.2 10*3/uL (ref 0.0–0.5)
Eosinophils Relative: 4 %
HCT: 32.3 % — ABNORMAL LOW (ref 36.0–46.0)
Hemoglobin: 10.7 g/dL — ABNORMAL LOW (ref 12.0–15.0)
Immature Granulocytes: 0 %
Lymphocytes Relative: 18 %
Lymphs Abs: 0.9 10*3/uL (ref 0.7–4.0)
MCH: 28 pg (ref 26.0–34.0)
MCHC: 33.1 g/dL (ref 30.0–36.0)
MCV: 84.6 fL (ref 80.0–100.0)
Monocytes Absolute: 0.3 10*3/uL (ref 0.1–1.0)
Monocytes Relative: 6 %
Neutro Abs: 3.6 10*3/uL (ref 1.7–7.7)
Neutrophils Relative %: 71 %
Platelets: 327 10*3/uL (ref 150–400)
RBC: 3.82 MIL/uL — ABNORMAL LOW (ref 3.87–5.11)
RDW: 14.4 % (ref 11.5–15.5)
WBC: 5 10*3/uL (ref 4.0–10.5)
nRBC: 0 % (ref 0.0–0.2)

## 2019-01-17 LAB — COMPREHENSIVE METABOLIC PANEL
ALT: 10 U/L (ref 0–44)
AST: 15 U/L (ref 15–41)
Albumin: 4.5 g/dL (ref 3.5–5.0)
Alkaline Phosphatase: 126 U/L (ref 38–126)
Anion gap: 16 — ABNORMAL HIGH (ref 5–15)
BUN: 66 mg/dL — ABNORMAL HIGH (ref 6–20)
CO2: 24 mmol/L (ref 22–32)
Calcium: 9.8 mg/dL (ref 8.9–10.3)
Chloride: 96 mmol/L — ABNORMAL LOW (ref 98–111)
Creatinine, Ser: 14.96 mg/dL — ABNORMAL HIGH (ref 0.44–1.00)
GFR calc Af Amer: 3 mL/min — ABNORMAL LOW (ref >60)
GFR calc non Af Amer: 3 mL/min — ABNORMAL LOW (ref >60)
Glucose, Bld: 157 mg/dL — ABNORMAL HIGH (ref 70–99)
Potassium: 4.7 mmol/L (ref 3.5–5.1)
Sodium: 136 mmol/L (ref 135–145)
Total Bilirubin: 0.5 mg/dL (ref 0.3–1.2)
Total Protein: 8.6 g/dL — ABNORMAL HIGH (ref 6.5–8.1)

## 2019-01-17 LAB — IRON AND TIBC
Iron: 139 ug/dL (ref 28–170)
Saturation Ratios: 48 % — ABNORMAL HIGH (ref 10.4–31.8)
TIBC: 292 ug/dL (ref 250–450)
UIBC: 153 ug/dL

## 2019-01-17 LAB — LACTATE DEHYDROGENASE: LDH: 263 U/L — ABNORMAL HIGH (ref 98–192)

## 2019-01-17 LAB — FERRITIN: Ferritin: 1316 ng/mL — ABNORMAL HIGH (ref 11–307)

## 2019-01-17 MED ORDER — SODIUM CHLORIDE 0.9 % IV SOLN
INTRAVENOUS | Status: DC
  Administered 2019-01-17: 11:00:00 via INTRAVENOUS
  Filled 2019-01-17: qty 250

## 2019-01-17 MED ORDER — SODIUM CHLORIDE 0.9 % IV SOLN
3300.0000 mg | INTRAVENOUS | Status: DC
  Administered 2019-01-17: 3300 mg via INTRAVENOUS
  Filled 2019-01-17: qty 330

## 2019-01-17 MED ORDER — SODIUM CHLORIDE 0.9% FLUSH
10.0000 mL | Freq: Once | INTRAVENOUS | Status: DC
  Filled 2019-01-17: qty 10

## 2019-01-17 NOTE — Assessment & Plan Note (Addendum)
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 Hb -10.7. slightly elevated LDH; platelets are normal.  Continue Ultimoris infusions every 8 weeks.   #Patient looking into options of discontinuation of Ultimoris-however I would defer this to hematologist at [facility].  # Anemia- multifactorial- 10.7; likely secondary to ESRD. Discussed re: re-starting Epo. Discussed the issues with elevated BPs/see below. Iron studies in AUG 2020- adequate.   # ESRD- on PD- stable. Awaiting to get the HD catheter out next week.  On the transplant list.  #Hypertension-160s/95- recommend taking medications prior.   # DISPOSITION: # Treatment today # follow up in 8 weeks- MD- labs-cbc.cmp/ldh;possble erythropoietin/ Ultomiris-Dr.B

## 2019-01-17 NOTE — Addendum Note (Signed)
Addended by: Sandria Bales B on: 01/17/2019 02:58 PM   Modules accepted: Orders

## 2019-01-17 NOTE — Progress Notes (Signed)
[name] NOTE  Patient Care Team: Rubie Maid, MD as PCP - General (Obstetrics and Gynecology) [name], FNP as Consulting Physician (Family Medicine) Azzie Glatter, MD as Consulting Physician (Internal Medicine) Sherryll Burger, MD as Consulting Physician (Nephrology)  CHIEF COMPLAINTS/PURPOSE OF CONSULTATION: Atypical HUS  # ATYPICAL HEMOLYTIC UREMIC SYNDROME [April 2019-Dx; [facility]; Dr.Arepally;s/p Plex x1; April 26th- Ecluzimab 1200 mg q 2W; march 25th 2020- Ultomiris q  8 weeks.   # AKI/CKD [name] 2018;FEB 2020- hemodialysis [[facility] in [place]; Tuesday Thursday Saturday]; May 2020-peritoneal dialysis  # IDDM [since age of 36- Dr.Kerr,/Endo]  # WORK UP for HUS [[facility]] Phospholipase A2 Receptor AB/ S Phospholipase A2 Receptor IFA-NEGATIVE   Oncology History   No history exists.   HISTORY OF PRESENTING ILLNESS:  Kelly Huff 33 y.o.  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  Patient interim had difficulty with the peritoneal dialysis catheter.  She had a hemodialysis catheter put in.  However she fell recently lost positioning of the hemodialysis catheter.  She was in the hospital last month for that.  She is currently transition back to peritoneal dialysis.  She is awaiting to have her hemodialysis catheter taken out.  Otherwise no headaches.  No fevers or chills.  No nausea no vomiting.  No diarrhea.  Review of Systems  Constitutional: Positive for malaise/fatigue. Negative for chills, diaphoresis, fever and weight loss.  HENT: Negative for nosebleeds and sore throat.   Eyes: Negative for double vision.  Respiratory: Negative for cough, hemoptysis, sputum production, shortness of breath and wheezing.   Cardiovascular: Negative for chest pain, palpitations, orthopnea and leg swelling.  Gastrointestinal: Negative for abdominal pain, blood in stool, constipation, diarrhea,  heartburn, melena, nausea and vomiting.  Genitourinary: Negative for dysuria, frequency and urgency.  Musculoskeletal: Negative for back pain and joint pain.  Skin: Negative.  Negative for itching and rash.  Neurological: Negative for dizziness, tingling, focal weakness, weakness and headaches.  Endo/Heme/Allergies: Does not bruise/bleed easily.  Psychiatric/Behavioral: Negative for depression. The patient is not nervous/anxious and does not have insomnia.     MEDICAL HISTORY:  Past Medical History:  Diagnosis Date  . Chronic kidney disease   . Diabetes mellitus without complication ([place])   . History of pre-eclampsia 2016   mild  . Hypertension     SURGICAL HISTORY: Past Surgical History:  Procedure Laterality Date  . CESAREAN SECTION    . DILATION AND CURETTAGE OF UTERUS     x 4  . IR FLUORO GUIDE CV LINE RIGHT  12/05/2018  . IR FLUORO GUIDE CV LINE RIGHT  12/13/2018  . IR US GUIDE VASC ACCESS RIGHT  12/05/2018  . IR US GUIDE VASC ACCESS RIGHT  12/13/2018    SOCIAL HISTORY: Social History   Socioeconomic History  . Marital status: Married    Spouse name: Not on file  . Number of children: Not on file  . Years of education: Not on file  . Highest education level: Not on file  Occupational History  . Not on file  Social Needs  . Financial resource strain: Not on file  . Food insecurity    Worry: Not on file    Inability: Not on file  . Transportation needs    Medical: Not on file    Non-medical: Not on file  Tobacco Use  . Smoking status: Never Smoker  . Smokeless tobacco: Never Used  Substance and Sexual Activity  .  Alcohol use: No  . Drug use: No  . Sexual activity: Yes    Birth control/protection: None, Surgical    Comment: tubial  Lifestyle  . Physical activity    Days per week: Not on file    Minutes per session: Not on file  . Stress: Not on file  Relationships  . Social Herbalist on phone: Not on file    Gets together: Not on file     Attends religious service: Not on file    Active member of club or organization: Not on file    Attends meetings of clubs or organizations: Not on file    Relationship status: Not on file  . Intimate partner violence    Fear of current or ex partner: Not on file    Emotionally abused: Not on file    Physically abused: Not on file    Forced sexual activity: Not on file  Other Topics Concern  . Not on file  Social History Narrative  . Not on file    FAMILY HISTORY: Family History  Problem Relation Age of Onset  . Hypertension Mother   . Cancer Mother        Uterine vs cervical (pt unsure),   . Schizophrenia Brother   . Breast cancer Neg Hx     ALLERGIES:  is allergic to morphine; sulfamethoxazole-trimethoprim; and trulicity [dulaglutide].  MEDICATIONS:  Current Outpatient Medications  Medication Sig Dispense Refill  . amLODipine (NORVASC) 10 MG tablet Take 10 mg by mouth every evening.     . B Complex-C-Folic Acid (RENO CAPS) 1 MG CAPS Take 1 mg by mouth daily.    . calcium acetate (PHOSLO) 667 MG tablet Take 1,334 mg by mouth 3 (three) times daily with meals.    . cloNIDine (CATAPRES - DOSED IN MG/24 HR) 0.1 mg/24hr patch Place 0.1 mg onto the skin See admin instructions. Apply patch on every Sunday    . cyclobenzaprine (FLEXERIL) 10 MG tablet Take 10 mg by mouth at bedtime as needed for muscle spasms.     Marland Kitchen docusate sodium (COLACE) 100 MG capsule Take 100 mg by mouth 2 (two) times daily.    Marland Kitchen gentamicin cream (GARAMYCIN) 0.1 % Apply 1 application topically See admin instructions. Apply small amount to catheter exit site once daily    . Insulin Human (INSULIN PUMP) SOLN Inject 1 each into the skin continuous. Novolog. Patient stated she is not sure how much insulin is given into the pump    . lactulose (CHRONULAC) 10 GM/15ML solution Take 20 g by mouth at bedtime as needed (constipation).     . metoprolol succinate (TOPROL-XL) 100 MG 24 hr tablet Take 100 mg by mouth 2 (two)  times daily.    Marland Kitchen olmesartan (BENICAR) 40 MG tablet Take 40 mg by mouth every morning.    . paricalcitol (ZEMPLAR) 1 MCG capsule Take 1 mcg by mouth daily.     No current facility-administered medications for this visit.    Facility-Administered Medications Ordered in Other Visits  Medication Dose Route Frequency Provider Last Rate Last Dose  . sodium chloride flush (NS) 0.9 % injection 10 mL  10 mL Intravenous Once Cammie Sickle, MD       PHYSICAL EXAMINATION: ECOG PERFORMANCE STATUS: 1 - Symptomatic but completely ambulatory  Vitals:   01/17/19 1034  BP: (!) 163/95  Pulse: 83  Temp: 98.5 F (36.9 C)   Filed Weights   01/17/19 1034  Weight: 171  lb (77.6 kg)    Physical Exam  Constitutional: She is oriented to person, place, and time and well-developed, well-nourished, and in no distress.  Patient is alone.  HENT:  Head: Normocephalic and atraumatic.  Mouth/Throat: Oropharynx is clear and moist. No oropharyngeal exudate.  Eyes: Pupils are equal, round, and reactive to light.  Neck: Normal range of motion. Neck supple.  Cardiovascular: Normal rate and regular rhythm.  Pulmonary/Chest: No respiratory distress. She has no wheezes.  Abdominal: Soft. Bowel sounds are normal. She exhibits no distension and no mass. There is no abdominal tenderness. There is no rebound and no guarding.  Musculoskeletal: Normal range of motion.        General: No tenderness or edema.  Neurological: She is alert and oriented to person, place, and time.  Skin: Skin is warm.  Psychiatric: Affect normal.  ;  LABORATORY DATA:  I have reviewed the data as listed Lab Results  Component Value Date   WBC 5.0 01/17/2019   HGB 10.7 (L) 01/17/2019   HCT 32.3 (L) 01/17/2019   MCV 84.6 01/17/2019   PLT 327 01/17/2019   Recent Labs    09/03/18 0447  11/25/18 0921  12/04/18 0555  12/05/18 1754  12/13/18 1959 12/14/18 0820 01/17/19 0958  NA 130*   < > 137  --  138   < > 135   < > 134*  133* 136  K 4.7   < > 5.2*  --  4.5   < > 4.7   < > 4.9 3.9 4.7  CL 90*   < > 103  --  101   < > 100   < > 97* 95* 96*  CO2 19*   < > 20*  --  18*   < > 19*   < > 21* 26 24  GLUCOSE 381*   < > 123*  --  120*   < > 207*   < > 177* 207* 157*  BUN 33*   < > 80*  --  84*   < > 87*   < > 71* 27* 66*  CREATININE 8.87*   < > 17.46*   < > 15.96*   < > 16.89*   < > 13.64* 8.03* 14.96*  CALCIUM 9.8   < > 7.6*  --  6.4*   < > 6.7*   < > 8.7* 8.0* 9.8  GFRNONAA 5*   < > 2*   < > 3*   < > 2*   < > 3* 6* 3*  GFRAA 6*   < > 3*   < > 3*   < > 3*   < > 4* 7* 3*  PROT 7.2   < > 6.3*  --  5.3*  --   --   --   --   --  8.6*  ALBUMIN 3.6   < > 3.0*  --  2.6*   < > 2.6*  --  3.4*  --  4.5  AST 16   < > 14*  --  12*  --   --   --   --   --  15  ALT 13   < > 10  --  5  --   --   --   --   --  10  ALKPHOS 130*   < > 101  --  77  --   --   --   --   --  126  BILITOT 0.5   < >  0.4  --  0.5  --   --   --   --   --  0.5  BILIDIR <0.1  --   --   --   --   --   --   --   --   --   --   IBILI NOT CALCULATED  --   --   --   --   --   --   --   --   --   --    < > = values in this interval not displayed.    RADIOGRAPHIC STUDIES: I have personally reviewed the radiological images as listed and agreed with the findings in the report. No results found.  ASSESSMENT & PLAN:   HUS (hemolytic uremic syndrome), atypical ([place]) # Atypical hemolytic uremic syndrome -on Ultimoris infusions. Hb -10.7. slightly elevated LDH; platelets are normal.  Continue Ultimoris infusions every 8 weeks.   #Patient looking into options of discontinuation of Ultimoris-however I would defer this to hematologist at [facility].  # Anemia- multifactorial- 10.7; likely secondary to ESRD. Discussed re: re-starting Epo. Discussed the issues with elevated BPs/see below. Iron studies in AUG 2020- adequate.   # ESRD- on PD- stable. Awaiting to get the HD catheter out next week.  On the transplant list.  #Hypertension-160s/95- recommend taking medications  prior.   # DISPOSITION: # Treatment today # follow up in 8 weeks- MD- labs-cbc.cmp/ldh;possble erythropoietin/ Ultomiris-Dr.B  All questions were answered. The patient knows to call the clinic with any problems, questions or concerns.    Cammie Sickle, MD 01/17/2019 11:08 AM

## 2019-01-18 LAB — HAPTOGLOBIN: Haptoglobin: 106 mg/dL (ref 33–278)

## 2019-02-14 ENCOUNTER — Other Ambulatory Visit (HOSPITAL_COMMUNITY): Payer: Self-pay | Admitting: Nephrology

## 2019-02-14 DIAGNOSIS — N186 End stage renal disease: Secondary | ICD-10-CM

## 2019-02-14 DIAGNOSIS — Z992 Dependence on renal dialysis: Secondary | ICD-10-CM

## 2019-02-22 ENCOUNTER — Encounter (HOSPITAL_COMMUNITY): Payer: Self-pay | Admitting: Radiology

## 2019-02-22 ENCOUNTER — Other Ambulatory Visit: Payer: Self-pay

## 2019-02-22 ENCOUNTER — Ambulatory Visit (HOSPITAL_COMMUNITY)
Admission: RE | Admit: 2019-02-22 | Discharge: 2019-02-22 | Disposition: A | Discharge status: Home / Self Care | Payer: Medicare Other | Source: Ambulatory Visit | Attending: Nephrology | Admitting: Nephrology

## 2019-02-22 ENCOUNTER — Other Ambulatory Visit (HOSPITAL_COMMUNITY): Payer: Self-pay | Admitting: Nephrology

## 2019-02-22 DIAGNOSIS — N186 End stage renal disease: Secondary | ICD-10-CM

## 2019-02-22 DIAGNOSIS — Z992 Dependence on renal dialysis: Secondary | ICD-10-CM | POA: Insufficient documentation

## 2019-02-22 DIAGNOSIS — Z452 Encounter for adjustment and management of vascular access device: Secondary | ICD-10-CM | POA: Diagnosis present

## 2019-02-22 HISTORY — PX: IR REMOVAL TUN CV CATH W/O FL: IMG2289

## 2019-02-22 MED ORDER — LIDOCAINE HCL (PF) 1 % IJ SOLN
INTRAMUSCULAR | Status: DC | PRN
  Administered 2019-02-22: 10 mL

## 2019-02-22 MED ORDER — CHLORHEXIDINE GLUCONATE 4 % EX LIQD
CUTANEOUS | Status: AC
  Filled 2019-02-22: qty 15

## 2019-02-22 MED ORDER — LIDOCAINE HCL 1 % IJ SOLN
INTRAMUSCULAR | Status: AC
  Filled 2019-02-22: qty 20

## 2019-02-22 NOTE — Procedures (Signed)
Successful removal of tunneled (R)IJ HD cath No complications.  Ascencion Dike PA-C Interventional Radiology 02/22/2019 11:23 AM

## 2019-03-14 ENCOUNTER — Encounter: Payer: Self-pay | Admitting: Internal Medicine

## 2019-03-14 ENCOUNTER — Other Ambulatory Visit: Payer: Self-pay

## 2019-03-14 ENCOUNTER — Inpatient Hospital Stay: Payer: Medicare Other

## 2019-03-14 ENCOUNTER — Inpatient Hospital Stay: Payer: Medicare Other | Attending: Internal Medicine | Admitting: Internal Medicine

## 2019-03-14 DIAGNOSIS — Z79899 Other long term (current) drug therapy: Secondary | ICD-10-CM | POA: Diagnosis not present

## 2019-03-14 DIAGNOSIS — Z794 Long term (current) use of insulin: Secondary | ICD-10-CM | POA: Insufficient documentation

## 2019-03-14 DIAGNOSIS — I120 Hypertensive chronic kidney disease with stage 5 chronic kidney disease or end stage renal disease: Secondary | ICD-10-CM | POA: Diagnosis not present

## 2019-03-14 DIAGNOSIS — Z7682 Awaiting organ transplant status: Secondary | ICD-10-CM | POA: Insufficient documentation

## 2019-03-14 DIAGNOSIS — N186 End stage renal disease: Secondary | ICD-10-CM | POA: Diagnosis not present

## 2019-03-14 DIAGNOSIS — D5939 Other hemolytic-uremic syndrome: Secondary | ICD-10-CM

## 2019-03-14 DIAGNOSIS — E1122 Type 2 diabetes mellitus with diabetic chronic kidney disease: Secondary | ICD-10-CM | POA: Diagnosis not present

## 2019-03-14 DIAGNOSIS — D593 Hemolytic-uremic syndrome: Secondary | ICD-10-CM

## 2019-03-14 LAB — COMPREHENSIVE METABOLIC PANEL
ALT: 10 U/L (ref 0–44)
AST: 12 U/L — ABNORMAL LOW (ref 15–41)
Albumin: 3.7 g/dL (ref 3.5–5.0)
Alkaline Phosphatase: 110 U/L (ref 38–126)
Anion gap: 10 (ref 5–15)
BUN: 60 mg/dL — ABNORMAL HIGH (ref 6–20)
CO2: 25 mmol/L (ref 22–32)
Calcium: 8.9 mg/dL (ref 8.9–10.3)
Chloride: 98 mmol/L (ref 98–111)
Creatinine, Ser: 13.24 mg/dL — ABNORMAL HIGH (ref 0.44–1.00)
GFR calc Af Amer: 4 mL/min — ABNORMAL LOW (ref >60)
GFR calc non Af Amer: 3 mL/min — ABNORMAL LOW (ref >60)
Glucose, Bld: 92 mg/dL (ref 70–99)
Potassium: 4.1 mmol/L (ref 3.5–5.1)
Sodium: 133 mmol/L — ABNORMAL LOW (ref 135–145)
Total Bilirubin: 0.4 mg/dL (ref 0.3–1.2)
Total Protein: 7.2 g/dL (ref 6.5–8.1)

## 2019-03-14 LAB — CBC WITH DIFFERENTIAL/PLATELET
Abs Immature Granulocytes: 0.02 10*3/uL (ref 0.00–0.07)
Basophils Absolute: 0.1 10*3/uL (ref 0.0–0.1)
Basophils Relative: 1 %
Eosinophils Absolute: 0.2 10*3/uL (ref 0.0–0.5)
Eosinophils Relative: 3 %
HCT: 32 % — ABNORMAL LOW (ref 36.0–46.0)
Hemoglobin: 10.7 g/dL — ABNORMAL LOW (ref 12.0–15.0)
Immature Granulocytes: 0 %
Lymphocytes Relative: 26 %
Lymphs Abs: 1.4 10*3/uL (ref 0.7–4.0)
MCH: 27.6 pg (ref 26.0–34.0)
MCHC: 33.4 g/dL (ref 30.0–36.0)
MCV: 82.7 fL (ref 80.0–100.0)
Monocytes Absolute: 0.4 10*3/uL (ref 0.1–1.0)
Monocytes Relative: 7 %
Neutro Abs: 3.3 10*3/uL (ref 1.7–7.7)
Neutrophils Relative %: 63 %
Platelets: 333 10*3/uL (ref 150–400)
RBC: 3.87 MIL/uL (ref 3.87–5.11)
RDW: 13.8 % (ref 11.5–15.5)
WBC: 5.3 10*3/uL (ref 4.0–10.5)
nRBC: 0 % (ref 0.0–0.2)

## 2019-03-14 LAB — LACTATE DEHYDROGENASE: LDH: 152 U/L (ref 98–192)

## 2019-03-14 MED ORDER — SODIUM CHLORIDE 0.9 % IV SOLN
INTRAVENOUS | Status: DC
  Administered 2019-03-14: 11:00:00 via INTRAVENOUS
  Filled 2019-03-14: qty 250

## 2019-03-14 MED ORDER — SODIUM CHLORIDE 0.9 % IV SOLN
3300.0000 mg | INTRAVENOUS | Status: DC
  Administered 2019-03-14: 3300 mg via INTRAVENOUS
  Filled 2019-03-14: qty 330

## 2019-03-14 NOTE — Progress Notes (Signed)
[place] NOTE  Patient Care Team: Rubie Maid, MD as PCP - General (Obstetrics and Gynecology) [name], FNP as Consulting Physician (Family Medicine) Azzie Glatter, MD as Consulting Physician (Internal Medicine) Sherryll Burger, MD as Consulting Physician (Nephrology)  CHIEF COMPLAINTS/PURPOSE OF CONSULTATION: Atypical HUS  # ATYPICAL HEMOLYTIC UREMIC SYNDROME [April 2019-Dx; [facility]; Dr.Arepally;s/p Plex x1; April 26th- Ecluzimab 1200 mg q 2W; march 25th 2020- Ultomiris q  8 weeks.   # AKI/CKD [name] 2018;FEB 2020- hemodialysis [[facility] in [place]; Tuesday Thursday Saturday]; May 2020-peritoneal dialysis  # IDDM [since age of 23- Dr.Kerr,/Endo]  # WORK UP for HUS [[facility]] Phospholipase A2 Receptor AB/ S Phospholipase A2 Receptor IFA-NEGATIVE   Oncology History   No history exists.   HISTORY OF PRESENTING ILLNESS:  Kelly Huff 33 y.o.  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  Patient denies any further issues with dialysis.  She continues to get peritoneal dialysis at home.   She is excited about possible kidney transplant soon.  Otherwise no headaches.  No fevers or chills.  No nausea no vomiting.  No diarrhea.  Review of Systems  Constitutional: Positive for malaise/fatigue. Negative for chills, diaphoresis, fever and weight loss.  HENT: Negative for nosebleeds and sore throat.   Eyes: Negative for double vision.  Respiratory: Negative for cough, hemoptysis, sputum production, shortness of breath and wheezing.   Cardiovascular: Negative for chest pain, palpitations, orthopnea and leg swelling.  Gastrointestinal: Negative for abdominal pain, blood in stool, constipation, diarrhea, heartburn, melena, nausea and vomiting.  Genitourinary: Negative for dysuria, frequency and urgency.  Musculoskeletal: Negative for back pain and joint pain.  Skin: Negative.  Negative for  itching and rash.  Neurological: Negative for dizziness, tingling, focal weakness, weakness and headaches.  Endo/Heme/Allergies: Does not bruise/bleed easily.  Psychiatric/Behavioral: Negative for depression. The patient is not nervous/anxious and does not have insomnia.     MEDICAL HISTORY:  Past Medical History:  Diagnosis Date  . Chronic kidney disease   . Diabetes mellitus without complication ([place])   . History of pre-eclampsia 2016   mild  . Hypertension     SURGICAL HISTORY: Past Surgical History:  Procedure Laterality Date  . CESAREAN SECTION    . DILATION AND CURETTAGE OF UTERUS     x 4  . IR FLUORO GUIDE CV LINE RIGHT  12/05/2018  . IR FLUORO GUIDE CV LINE RIGHT  12/13/2018  . IR REMOVAL TUN CV CATH W/O FL  02/22/2019  . IR US GUIDE VASC ACCESS RIGHT  12/05/2018  . IR US GUIDE VASC ACCESS RIGHT  12/13/2018    SOCIAL HISTORY: Social History   Socioeconomic History  . Marital status: Married    Spouse name: Not on file  . Number of children: Not on file  . Years of education: Not on file  . Highest education level: Not on file  Occupational History  . Not on file  Social Needs  . Financial resource strain: Not on file  . Food insecurity    Worry: Not on file    Inability: Not on file  . Transportation needs    Medical: Not on file    Non-medical: Not on file  Tobacco Use  . Smoking status: Never Smoker  . Smokeless tobacco: Never Used  Substance and Sexual Activity  . Alcohol use: No  . Drug use: No  . Sexual activity: Yes    Birth control/protection: None, Surgical  Comment: tubial  Lifestyle  . Physical activity    Days per week: Not on file    Minutes per session: Not on file  . Stress: Not on file  Relationships  . Social Herbalist on phone: Not on file    Gets together: Not on file    Attends religious service: Not on file    Active member of club or organization: Not on file    Attends meetings of clubs or organizations: Not on  file    Relationship status: Not on file  . Intimate partner violence    Fear of current or ex partner: Not on file    Emotionally abused: Not on file    Physically abused: Not on file    Forced sexual activity: Not on file  Other Topics Concern  . Not on file  Social History Narrative  . Not on file    FAMILY HISTORY: Family History  Problem Relation Age of Onset  . Hypertension Mother   . Cancer Mother        Uterine vs cervical (pt unsure),   . Schizophrenia Brother   . Breast cancer Neg Hx     ALLERGIES:  is allergic to morphine; sulfamethoxazole-trimethoprim; and trulicity [dulaglutide].  MEDICATIONS:  Current Outpatient Medications  Medication Sig Dispense Refill  . amLODipine (NORVASC) 10 MG tablet Take 10 mg by mouth every evening.     . B Complex-C-Folic Acid (RENO CAPS) 1 MG CAPS Take 1 mg by mouth daily.    . calcium acetate (PHOSLO) 667 MG tablet Take 1,334 mg by mouth 3 (three) times daily with meals.    . cloNIDine (CATAPRES - DOSED IN MG/24 HR) 0.1 mg/24hr patch Place 0.1 mg onto the skin See admin instructions. Apply patch on every Sunday    . cyclobenzaprine (FLEXERIL) 10 MG tablet Take 10 mg by mouth at bedtime as needed for muscle spasms.     Marland Kitchen gentamicin cream (GARAMYCIN) 0.1 % Apply 1 application topically See admin instructions. Apply small amount to catheter exit site once daily    . Insulin Human (INSULIN PUMP) SOLN Inject 1 each into the skin continuous. Novolog. Patient stated she is not sure how much insulin is given into the pump    . lactulose (CHRONULAC) 10 GM/15ML solution Take 20 g by mouth at bedtime as needed (constipation).     . metoprolol succinate (TOPROL-XL) 100 MG 24 hr tablet Take 100 mg by mouth 2 (two) times daily.    Marland Kitchen docusate sodium (COLACE) 100 MG capsule Take 100 mg by mouth 2 (two) times daily.    Marland Kitchen olmesartan (BENICAR) 40 MG tablet Take 40 mg by mouth every morning.    . paricalcitol (ZEMPLAR) 1 MCG capsule Take 1 mcg by mouth  daily.     No current facility-administered medications for this visit.    Facility-Administered Medications Ordered in Other Visits  Medication Dose Route Frequency Provider Last Rate Last Dose  . 0.9 %  sodium chloride infusion   Intravenous Continuous Cammie Sickle, MD   Stopped at 03/14/19 1340  . ravulizumab-cwvz (ULTOMIRIS) 3,300 mg in sodium chloride 0.9 % 660 mL (5 mg/mL) infusion  3,300 mg Intravenous Q8 Jonna Clark, MD   Stopped at 03/14/19 1334   PHYSICAL EXAMINATION: ECOG PERFORMANCE STATUS: 1 - Symptomatic but completely ambulatory  Vitals:   03/14/19 0957  BP: (!) 148/81  Pulse: 93  Temp: (!) 97.3 F (36.3 C)   Filed  Weights   03/14/19 0957  Weight: 181 lb (82.1 kg)    Physical Exam  Constitutional: She is oriented to person, place, and time and well-developed, well-nourished, and in no distress.  Patient is alone.  HENT:  Head: Normocephalic and atraumatic.  Mouth/Throat: Oropharynx is clear and moist. No oropharyngeal exudate.  Eyes: Pupils are equal, round, and reactive to light.  Neck: Normal range of motion. Neck supple.  Cardiovascular: Normal rate and regular rhythm.  Pulmonary/Chest: No respiratory distress. She has no wheezes.  Abdominal: Soft. Bowel sounds are normal. She exhibits no distension and no mass. There is no abdominal tenderness. There is no rebound and no guarding.  Musculoskeletal: Normal range of motion.        General: No tenderness or edema.  Neurological: She is alert and oriented to person, place, and time.  Skin: Skin is warm.  Psychiatric: Affect normal.  ;  LABORATORY DATA:  I have reviewed the data as listed Lab Results  Component Value Date   WBC 5.3 03/14/2019   HGB 10.7 (L) 03/14/2019   HCT 32.0 (L) 03/14/2019   MCV 82.7 03/14/2019   PLT 333 03/14/2019   Recent Labs    09/03/18 0447  12/04/18 0555  12/13/18 1959 12/14/18 0820 01/17/19 0958 03/14/19 0932  NA 130*   < > 138   < > 134*  133* 136 133*  K 4.7   < > 4.5   < > 4.9 3.9 4.7 4.1  CL 90*   < > 101   < > 97* 95* 96* 98  CO2 19*   < > 18*   < > 21* 26 24 25   GLUCOSE 381*   < > 120*   < > 177* 207* 157* 92  BUN 33*   < > 84*   < > 71* 27* 66* 60*  CREATININE 8.87*   < > 15.96*   < > 13.64* 8.03* 14.96* 13.24*  CALCIUM 9.8   < > 6.4*   < > 8.7* 8.0* 9.8 8.9  GFRNONAA 5*   < > 3*   < > 3* 6* 3* 3*  GFRAA 6*   < > 3*   < > 4* 7* 3* 4*  PROT 7.2   < > 5.3*  --   --   --  8.6* 7.2  ALBUMIN 3.6   < > 2.6*   < > 3.4*  --  4.5 3.7  AST 16   < > 12*  --   --   --  15 12*  ALT 13   < > 5  --   --   --  10 10  ALKPHOS 130*   < > 77  --   --   --  126 110  BILITOT 0.5   < > 0.5  --   --   --  0.5 0.4  BILIDIR <0.1  --   --   --   --   --   --   --   IBILI NOT CALCULATED  --   --   --   --   --   --   --    < > = values in this interval not displayed.    RADIOGRAPHIC STUDIES: I have personally reviewed the radiological images as listed and agreed with the findings in the report. Ir Removal Tun Cv Cath W/o Fl  Result Date: 02/22/2019 INDICATION: End-stage renal disease. Patient has functional permanent peritoneal dialysis catheter. Request removal  of tunneled right IJ hemodialysis catheter EXAM: REMOVAL OF TUNNELED RIGHT IJ HEMODIALYSIS CATHETER MEDICATIONS: None COMPLICATIONS: None immediate. PROCEDURE: Informed written consent was obtained from the patient following an explanation of the procedure, risks, benefits and alternatives to treatment. A time out was performed prior to the initiation of the procedure. Maximal barrier sterile technique was utilized including caps, mask, sterile gowns, sterile gloves, large sterile drape, hand hygiene, and chlorhexidine. 1% lidocaine was injected under sterile conditions along the subcutaneous tunnel. Utilizing a combination of blunt dissection and gentle traction, the cuff of the catheter was exposed and the catheter was removed intact. Hemostasis was obtained with manual compression. A  dressing was placed. The patient tolerated the procedure well without immediate post procedural complication. IMPRESSION: Successful removal of tunneled right IJ dialysis catheter. Read by: Ascencion Dike PA-C Electronically Signed   By: Markus Daft M.D.   On: 02/22/2019 11:25    ASSESSMENT & PLAN:   HUS (hemolytic uremic syndrome), atypical ([place]) # Atypical hemolytic uremic syndrome -on Ultimoris infusions.  Stable.  Hb -10.7  platelets are normal.  Continue Ultimoris infusions every 8 weeks; continue indefinitely at this time.  # Anemia- multifactorial- 10.7; likely secondary to ESRD; on procrit prn per nephrology.   # ESRD- on PD-  STABLE; awaiting transplant list.  #Hypertension-140s/ 85. Continue current medications.   # DISPOSITION: # Treatment today # follow up in 8 weeks- MD- labs-cbc.cmp/ldh;/ Ultomiris-Dr.B  All questions were answered. The patient knows to call the clinic with any problems, questions or concerns.    Cammie Sickle, MD 03/14/2019 3:03 PM

## 2019-03-14 NOTE — Assessment & Plan Note (Addendum)
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  Stable.  Hb -10.7  platelets are normal.  Continue Ultimoris infusions every 8 weeks; continue indefinitely at this time.  # Anemia- multifactorial- 10.7; likely secondary to ESRD; on procrit prn per nephrology.   # ESRD- on PD-  STABLE; awaiting transplant list.  #Hypertension-140s/ 85. Continue current medications.   # DISPOSITION: # Treatment today # follow up in 8 weeks- MD- labs-cbc.cmp/ldh;/ Ultomiris-Dr.B

## 2019-04-04 ENCOUNTER — Ambulatory Visit (INDEPENDENT_AMBULATORY_CARE_PROVIDER_SITE_OTHER): Payer: Medicare Other | Admitting: Obstetrics and Gynecology

## 2019-04-04 ENCOUNTER — Encounter: Payer: Self-pay | Admitting: Obstetrics and Gynecology

## 2019-04-04 ENCOUNTER — Other Ambulatory Visit: Payer: Self-pay

## 2019-04-04 VITALS — BP 125/73 | HR 89 | Ht 67.0 in | Wt 173.4 lb

## 2019-04-04 DIAGNOSIS — Z8759 Personal history of other complications of pregnancy, childbirth and the puerperium: Secondary | ICD-10-CM

## 2019-04-04 DIAGNOSIS — F321 Major depressive disorder, single episode, moderate: Secondary | ICD-10-CM | POA: Diagnosis not present

## 2019-04-04 DIAGNOSIS — Z8659 Personal history of other mental and behavioral disorders: Secondary | ICD-10-CM

## 2019-04-04 MED ORDER — BUPROPION HCL ER (XL) 150 MG PO TB24: 150.0000 mg | ORAL_TABLET | Freq: Every day | ORAL | 3 refills | Status: DC

## 2019-04-04 NOTE — Patient Instructions (Signed)

## 2019-04-04 NOTE — Progress Notes (Signed)
    GYNECOLOGY PROGRESS NOTE  Subjective:    Patient ID: Kelly Huff, female    DOB: Oct 08, 1985, 33 y.o.   MRN: [id number]  HPI  Patient is a 33 y.o. [id number] female who presents for complaints of depression.  Notes that she is having issues dealing with her baby.  Last child was born 69 moths ago, was premature (born at [redacted] weeks gestation). Has limited help at home, as her husband works a lot.  Is very stressed.  Does not have much other family in the area.  Also reports that the FOB of her first 2 children recently passed away (committed suicide in August).  Patient was dealing with some postpartum depression initially after the birth of her last child, but did not f/u for treatment. Also notes that she is doing dialysis at home which is very taxing on her body.   Also noting some decreased libido.   The following portions of the patient's history were reviewed and updated as appropriate: allergies, current medications, past family history, past medical history, past social history, past surgical history and problem list.  Review of Systems Pertinent items are noted in HPI.   Objective:   Blood pressure 125/73, pulse 89, height 5\' 7"  (1.702 m), weight 173 lb 6.4 oz (78.7 kg), last menstrual period 03/24/2019, unknown if currently breastfeeding. General appearance: alert and no distress Psychiatric: Normal affect and mood. Denies HI/SI.   Assessment:   Depression  Plan:   Discussion had with patient today regarding symptoms.  Had untreated postpartum depression last year, now noting several significant life stressors which have made her symptoms bad enough seek treatment. Likely suffering from postpartum depression.  Currently no SI/HI.  Patient notes that she in the past talked to a therapist (back in August and 05-Feb-2023 after FOB passes away).  Discussed options for management, including rresuming counseling (recommended) and/or medications, or both.  Patient desires medication  for now. Will also reach out to her therapist to see about resuming sessions.   Will prescribe Wellbutrin 150 mg daily.   To follow up in 3-4 weeks to re-evaluate symptoms.   A total of 15 minutes were spent face-to-face with the patient during this encounter and over half of that time dealt with counseling and coordination of care.   Rubie Maid, MD [facility]

## 2019-04-04 NOTE — Progress Notes (Signed)
Pt is present due to having post partum depression. Pt stated that the father of her first 2 children passed away in 01/17/23 of this year. Pt stated that she has a lot going on right now. EPDS= 14.

## 2019-04-26 ENCOUNTER — Encounter: Payer: Medicare Other | Admitting: Obstetrics and Gynecology

## 2019-04-26 ENCOUNTER — Other Ambulatory Visit: Payer: Self-pay | Admitting: Nurse Practitioner

## 2019-04-26 ENCOUNTER — Other Ambulatory Visit: Payer: Self-pay | Admitting: Obstetrics and Gynecology

## 2019-04-26 NOTE — Telephone Encounter (Signed)
Patient was on the schedule for today for follow up to see if this medication was helping but I guess she canceled.  I need to find out if she is doing ok on this medication or if it needs to be adjusted.  If she is doing ok, we can acknowledge this refill request change. But she still needs follow up.  Can even be a televisit sometime in the next week or 2 if she can't come in.

## 2019-04-27 NOTE — Telephone Encounter (Signed)
Sent pt a mychart message. 

## 2019-05-09 ENCOUNTER — Inpatient Hospital Stay: Payer: Medicare Other

## 2019-05-09 ENCOUNTER — Other Ambulatory Visit: Payer: Self-pay

## 2019-05-09 ENCOUNTER — Inpatient Hospital Stay: Payer: Medicare Other | Attending: Internal Medicine | Admitting: Internal Medicine

## 2019-05-09 DIAGNOSIS — D649 Anemia, unspecified: Secondary | ICD-10-CM | POA: Insufficient documentation

## 2019-05-09 DIAGNOSIS — Z79899 Other long term (current) drug therapy: Secondary | ICD-10-CM | POA: Insufficient documentation

## 2019-05-09 DIAGNOSIS — E1122 Type 2 diabetes mellitus with diabetic chronic kidney disease: Secondary | ICD-10-CM | POA: Insufficient documentation

## 2019-05-09 DIAGNOSIS — D593 Hemolytic-uremic syndrome: Secondary | ICD-10-CM

## 2019-05-09 DIAGNOSIS — I120 Hypertensive chronic kidney disease with stage 5 chronic kidney disease or end stage renal disease: Secondary | ICD-10-CM | POA: Diagnosis not present

## 2019-05-09 DIAGNOSIS — N186 End stage renal disease: Secondary | ICD-10-CM | POA: Diagnosis not present

## 2019-05-09 DIAGNOSIS — Z794 Long term (current) use of insulin: Secondary | ICD-10-CM | POA: Diagnosis not present

## 2019-05-09 DIAGNOSIS — Z7682 Awaiting organ transplant status: Secondary | ICD-10-CM | POA: Insufficient documentation

## 2019-05-09 DIAGNOSIS — D5939 Other hemolytic-uremic syndrome: Secondary | ICD-10-CM

## 2019-05-09 LAB — CBC WITH DIFFERENTIAL/PLATELET
Abs Immature Granulocytes: 0.02 10*3/uL (ref 0.00–0.07)
Basophils Absolute: 0.1 10*3/uL (ref 0.0–0.1)
Basophils Relative: 1 %
Eosinophils Absolute: 0.2 10*3/uL (ref 0.0–0.5)
Eosinophils Relative: 4 %
HCT: 28.9 % — ABNORMAL LOW (ref 36.0–46.0)
Hemoglobin: 9.3 g/dL — ABNORMAL LOW (ref 12.0–15.0)
Immature Granulocytes: 0 %
Lymphocytes Relative: 26 %
Lymphs Abs: 1.3 10*3/uL (ref 0.7–4.0)
MCH: 27.6 pg (ref 26.0–34.0)
MCHC: 32.2 g/dL (ref 30.0–36.0)
MCV: 85.8 fL (ref 80.0–100.0)
Monocytes Absolute: 0.4 10*3/uL (ref 0.1–1.0)
Monocytes Relative: 9 %
Neutro Abs: 2.8 10*3/uL (ref 1.7–7.7)
Neutrophils Relative %: 60 %
Platelets: 377 10*3/uL (ref 150–400)
RBC: 3.37 MIL/uL — ABNORMAL LOW (ref 3.87–5.11)
RDW: 14.5 % (ref 11.5–15.5)
WBC: 4.7 10*3/uL (ref 4.0–10.5)
nRBC: 0 % (ref 0.0–0.2)

## 2019-05-09 LAB — COMPREHENSIVE METABOLIC PANEL
ALT: 13 U/L (ref 0–44)
AST: 12 U/L — ABNORMAL LOW (ref 15–41)
Albumin: 3.7 g/dL (ref 3.5–5.0)
Alkaline Phosphatase: 111 U/L (ref 38–126)
Anion gap: 14 (ref 5–15)
BUN: 52 mg/dL — ABNORMAL HIGH (ref 6–20)
CO2: 26 mmol/L (ref 22–32)
Calcium: 8.7 mg/dL — ABNORMAL LOW (ref 8.9–10.3)
Chloride: 97 mmol/L — ABNORMAL LOW (ref 98–111)
Creatinine, Ser: 13.46 mg/dL — ABNORMAL HIGH (ref 0.44–1.00)
GFR calc Af Amer: 4 mL/min — ABNORMAL LOW (ref >60)
GFR calc non Af Amer: 3 mL/min — ABNORMAL LOW (ref >60)
Glucose, Bld: 69 mg/dL — ABNORMAL LOW (ref 70–99)
Potassium: 3.6 mmol/L (ref 3.5–5.1)
Sodium: 137 mmol/L (ref 135–145)
Total Bilirubin: 0.6 mg/dL (ref 0.3–1.2)
Total Protein: 7.3 g/dL (ref 6.5–8.1)

## 2019-05-09 LAB — LACTATE DEHYDROGENASE: LDH: 170 U/L (ref 98–192)

## 2019-05-09 MED ORDER — SODIUM CHLORIDE 0.9 % IV SOLN
INTRAVENOUS | Status: DC
  Filled 2019-05-09: qty 250

## 2019-05-09 MED ORDER — SODIUM CHLORIDE 0.9 % IV SOLN
3300.0000 mg | INTRAVENOUS | Status: DC
  Administered 2019-05-09: 3300 mg via INTRAVENOUS
  Filled 2019-05-09: qty 330

## 2019-05-09 NOTE — Assessment & Plan Note (Addendum)
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  Stable.  Hb -9.3  Platelets/LDH are normal.  Continue Ultimoris infusions every 8 weeks; continue indefinitely at this time. STABLE.   # Anemia- multifactorial- 9.3 ; likely secondary to ESRD; on procrit prn per nephrology.   # ESRD- on PD- STABLE;  awaiting transplant list.  #Hypertension-120s- STABLE.  # # I discussed regarding Covid precautions/and also discussed proceeding with Covid vaccination when available.  Discussed that unfortunately the data safety and efficacy of vaccination is unclear especially in patients with immunocompromised state.  However, I think the benefits of the vaccination outweigh the potential risks.   # DISPOSITION: # Treatment today # follow up in 8 weeks- MD- labs-cbc.cmp/ldh;/ Ultomiris-Dr.B 

## 2019-05-09 NOTE — Progress Notes (Signed)
[facility] NOTE  Patient Care Team: Rubie Maid, MD as PCP - General (Obstetrics and Gynecology) [name], FNP as Consulting Physician (Family Medicine) Azzie Glatter, MD as Consulting Physician (Internal Medicine) Sherryll Burger, MD as Consulting Physician (Nephrology)  CHIEF COMPLAINTS/PURPOSE OF CONSULTATION: Atypical HUS  # ATYPICAL HEMOLYTIC UREMIC SYNDROME [April 2019-Dx; [facility]; Dr.Arepally;s/p Plex x1; April 26th- Ecluzimab 1200 mg q 2W; march 25th 2020- Ultomiris q  8 weeks.   # AKI/CKD [name] 2018;FEB 2020- hemodialysis [[facility] in [place]; Tuesday Thursday Saturday]; May 2020-peritoneal dialysis  # IDDM [since age of 24- Dr.Kerr,/Endo]  # WORK UP for HUS [[facility]] Phospholipase A2 Receptor AB/ S Phospholipase A2 Receptor IFA-NEGATIVE   Oncology History   No history exists.   HISTORY OF PRESENTING ILLNESS:  Kelly Huff 34 y.o.  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  Patient continues to complain of chronic mild-moderate fatigue.  Has not had any problems with dialysis.  No calls from kidney transplant yet.  No headaches.  No fever no chills.  No nausea vomiting.  No diarrhea.  Review of Systems  Constitutional: Positive for malaise/fatigue. Negative for chills, diaphoresis, fever and weight loss.  HENT: Negative for nosebleeds and sore throat.   Eyes: Negative for double vision.  Respiratory: Negative for cough, hemoptysis, sputum production, shortness of breath and wheezing.   Cardiovascular: Negative for chest pain, palpitations, orthopnea and leg swelling.  Gastrointestinal: Negative for abdominal pain, blood in stool, constipation, diarrhea, heartburn, melena, nausea and vomiting.  Genitourinary: Negative for dysuria, frequency and urgency.  Musculoskeletal: Negative for back pain and joint pain.  Skin: Negative.  Negative for itching and rash.  Neurological:  Negative for dizziness, tingling, focal weakness, weakness and headaches.  Endo/Heme/Allergies: Does not bruise/bleed easily.  Psychiatric/Behavioral: Negative for depression. The patient is not nervous/anxious and does not have insomnia.     MEDICAL HISTORY:  Past Medical History:  Diagnosis Date  . Chronic kidney disease   . Diabetes mellitus without complication ([place])   . History of pre-eclampsia 2016   mild  . Hypertension     SURGICAL HISTORY: Past Surgical History:  Procedure Laterality Date  . CESAREAN SECTION    . DILATION AND CURETTAGE OF UTERUS     x 4  . IR FLUORO GUIDE CV LINE RIGHT  12/05/2018  . IR FLUORO GUIDE CV LINE RIGHT  12/13/2018  . IR REMOVAL TUN CV CATH W/O FL  02/22/2019  . IR US GUIDE VASC ACCESS RIGHT  12/05/2018  . IR US GUIDE VASC ACCESS RIGHT  12/13/2018    SOCIAL HISTORY: Social History   Socioeconomic History  . Marital status: Married    Spouse name: Not on file  . Number of children: Not on file  . Years of education: Not on file  . Highest education level: Not on file  Occupational History  . Not on file  Tobacco Use  . Smoking status: Never Smoker  . Smokeless tobacco: Never Used  Substance and Sexual Activity  . Alcohol use: No  . Drug use: No  . Sexual activity: Not Currently    Birth control/protection: None, Surgical    Comment: tubial  Other Topics Concern  . Not on file  Social History Narrative  . Not on file   Social Determinants of Health   Financial Resource Strain:   . Difficulty of Paying Living Expenses: Not on file  Food Insecurity:   . Worried  About Running Out of Food in the Last Year: Not on file  . Ran Out of Food in the Last Year: Not on file  Transportation Needs:   . Lack of Transportation (Medical): Not on file  . Lack of Transportation (Non-Medical): Not on file  Physical Activity:   . Days of Exercise per Week: Not on file  . Minutes of Exercise per Session: Not on file  Stress:   . Feeling of  Stress : Not on file  Social Connections:   . Frequency of Communication with Friends and Family: Not on file  . Frequency of Social Gatherings with Friends and Family: Not on file  . Attends Religious Services: Not on file  . Active Member of Clubs or Organizations: Not on file  . Attends Archivist Meetings: Not on file  . Marital Status: Not on file  Intimate Partner Violence:   . Fear of Current or Ex-Partner: Not on file  . Emotionally Abused: Not on file  . Physically Abused: Not on file  . Sexually Abused: Not on file    FAMILY HISTORY: Family History  Problem Relation Age of Onset  . Hypertension Mother   . Cancer Mother        Uterine vs cervical (pt unsure),   . Schizophrenia Brother   . Breast cancer Neg Hx     ALLERGIES:  is allergic to morphine; sulfamethoxazole-trimethoprim; and trulicity [dulaglutide].  MEDICATIONS:  Current Outpatient Medications  Medication Sig Dispense Refill  . amLODipine (NORVASC) 10 MG tablet Take 10 mg by mouth every evening.     . B Complex-C-Folic Acid (RENO CAPS) 1 MG CAPS Take 1 mg by mouth daily.    Marland Kitchen buPROPion (WELLBUTRIN XL) 150 MG 24 hr tablet TAKE 1 TABLET BY MOUTH EVERY DAY 90 tablet 2  . calcitRIOL (ROCALTROL) 0.25 MCG capsule Take 0.25 mcg by mouth daily.    . calcium acetate (PHOSLO) 667 MG tablet Take 1,334 mg by mouth 3 (three) times daily with meals.    . cloNIDine (CATAPRES - DOSED IN MG/24 HR) 0.1 mg/24hr patch Place 0.1 mg onto the skin See admin instructions. Apply patch on every Sunday    . cyclobenzaprine (FLEXERIL) 10 MG tablet Take 10 mg by mouth at bedtime as needed for muscle spasms.     Marland Kitchen docusate sodium (COLACE) 100 MG capsule Take 100 mg by mouth 2 (two) times daily.    Marland Kitchen gentamicin cream (GARAMYCIN) 0.1 % Apply 1 application topically See admin instructions. Apply small amount to catheter exit site once daily    . Insulin Human (INSULIN PUMP) SOLN Inject 1 each into the skin continuous. Novolog.  Patient stated she is not sure how much insulin is given into the pump    . lactulose (CHRONULAC) 10 GM/15ML solution Take 20 g by mouth at bedtime as needed (constipation).     . metoprolol succinate (TOPROL-XL) 100 MG 24 hr tablet Take 100 mg by mouth 2 (two) times daily.    Marland Kitchen olmesartan (BENICAR) 40 MG tablet Take 40 mg by mouth every morning.     No current facility-administered medications for this visit.   Facility-Administered Medications Ordered in Other Visits  Medication Dose Route Frequency Provider Last Rate Last Admin  . 0.9 %  sodium chloride infusion   Intravenous Continuous Cammie Sickle, MD 10 mL/hr at 05/09/19 1023 New Bag at 05/09/19 1023  . ravulizumab-cwvz (ULTOMIRIS) 3,300 mg in sodium chloride 0.9 % 660 mL (5 mg/mL) infusion  3,300 mg Intravenous Q8 Aletha Halim, Shelanda Duvall R, MD 330 mL/hr at 05/09/19 1110 3,300 mg at 05/09/19 1110   PHYSICAL EXAMINATION: ECOG PERFORMANCE STATUS: 1 - Symptomatic but completely ambulatory  Vitals:   05/09/19 0940  BP: (!) 128/92  Pulse: 76  Temp: (!) 97.5 F (36.4 C)   Filed Weights   05/09/19 0940  Weight: 175 lb (79.4 kg)    Physical Exam  Constitutional: She is oriented to person, place, and time and well-developed, well-nourished, and in no distress.  Patient is alone.  HENT:  Head: Normocephalic and atraumatic.  Mouth/Throat: Oropharynx is clear and moist. No oropharyngeal exudate.  Eyes: Pupils are equal, round, and reactive to light.  Cardiovascular: Normal rate and regular rhythm.  Pulmonary/Chest: No respiratory distress. She has no wheezes.  Abdominal: Soft. Bowel sounds are normal. She exhibits no distension and no mass. There is no abdominal tenderness. There is no rebound and no guarding.  Musculoskeletal:        General: No tenderness or edema. Normal range of motion.     Cervical back: Normal range of motion and neck supple.  Neurological: She is alert and oriented to person, place, and time.   Skin: Skin is warm.  Psychiatric: Affect normal.  ;  LABORATORY DATA:  I have reviewed the data as listed Lab Results  Component Value Date   WBC 4.7 05/09/2019   HGB 9.3 (L) 05/09/2019   HCT 28.9 (L) 05/09/2019   MCV 85.8 05/09/2019   PLT 377 05/09/2019   Recent Labs    09/03/18 0447 09/03/18 1042 01/17/19 0958 03/14/19 0932 05/09/19 0923  NA 130*  --  136 133* 137  K 4.7  --  4.7 4.1 3.6  CL 90*  --  96* 98 97*  CO2 19*  --  24 25 26   GLUCOSE 381*  --  157* 92 69*  BUN 33*  --  66* 60* 52*  CREATININE 8.87*  --  14.96* 13.24* 13.46*  CALCIUM 9.8  --  9.8 8.9 8.7*  GFRNONAA 5*  --  3* 3* 3*  GFRAA 6*  --  3* 4* 4*  PROT 7.2   < > 8.6* 7.2 7.3  ALBUMIN 3.6  --  4.5 3.7 3.7  AST 16   < > 15 12* 12*  ALT 13   < > 10 10 13   ALKPHOS 130*   < > 126 110 111  BILITOT 0.5   < > 0.5 0.4 0.6  BILIDIR <0.1  --   --   --   --   IBILI NOT CALCULATED  --   --   --   --    < > = values in this interval not displayed.    RADIOGRAPHIC STUDIES: I have personally reviewed the radiological images as listed and agreed with the findings in the report. No results found.  ASSESSMENT & PLAN:   HUS (hemolytic uremic syndrome), atypical ([place]) # Atypical hemolytic uremic syndrome -on Ultimoris infusions.  Stable.  Hb -9.3  Platelets/LDH are normal.  Continue Ultimoris infusions every 8 weeks; continue indefinitely at this time. STABLE.   # Anemia- multifactorial- 9.3 ; likely secondary to ESRD; on procrit prn per nephrology.   # ESRD- on PD- STABLE;  awaiting transplant list.  #Hypertension-120s- STABLE.  # # I discussed regarding Covid precautions/and also discussed proceeding with Covid vaccination when available.  Discussed that unfortunately the data safety and efficacy of vaccination is unclear especially in patients with immunocompromised state.  However, I think the benefits of the vaccination outweigh the potential risks.   # DISPOSITION: # Treatment today # follow up in 8  weeks- MD- labs-cbc.cmp/ldh;/ Ultomiris-Dr.B  All questions were answered. The patient knows to call the clinic with any problems, questions or concerns.    Cammie Sickle, MD 05/09/2019 12:54 PM

## 2019-05-10 ENCOUNTER — Encounter: Payer: Medicare Other | Admitting: Obstetrics and Gynecology

## 2019-07-04 ENCOUNTER — Inpatient Hospital Stay: Payer: Medicare Other | Admitting: Internal Medicine

## 2019-07-04 ENCOUNTER — Inpatient Hospital Stay: Payer: Medicare Other

## 2019-07-04 NOTE — Assessment & Plan Note (Deleted)
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  Stable.  Hb -9.3  Platelets/LDH are normal.  Continue Ultimoris infusions every 8 weeks; continue indefinitely at this time. STABLE.   # Anemia- multifactorial- 9.3 ; likely secondary to ESRD; on procrit prn per nephrology.   # ESRD- on PD- STABLE;  awaiting transplant list.  #Hypertension-120s- STABLE.  # # I discussed regarding Covid precautions/and also discussed proceeding with Covid vaccination when available.  Discussed that unfortunately the data safety and efficacy of vaccination is unclear especially in patients with immunocompromised state.  However, I think the benefits of the vaccination outweigh the potential risks.   # DISPOSITION: # Treatment today # follow up in 8 weeks- MD- labs-cbc.cmp/ldh;/ Ultomiris-Dr.B

## 2019-07-04 NOTE — Progress Notes (Deleted)
[facility] NOTE  Patient Care Team: Rubie Maid, MD as PCP - General (Obstetrics and Gynecology) [name], FNP as Consulting Physician (Family Medicine) Azzie Glatter, MD as Consulting Physician (Internal Medicine) Sherryll Burger, MD as Consulting Physician (Nephrology)  CHIEF COMPLAINTS/PURPOSE OF CONSULTATION: Atypical HUS  # ATYPICAL HEMOLYTIC UREMIC SYNDROME [April 2019-Dx; [facility]; Dr.Arepally;s/p Plex x1; April 26th- Ecluzimab 1200 mg q 2W; march 25th 2020- Ultomiris q  8 weeks.   # AKI/CKD [name] 2018;FEB 2020- hemodialysis [[facility] in [place]; Tuesday Thursday Saturday]; May 2020-peritoneal dialysis  # IDDM [since age of 24- Dr.Kerr,/Endo]  # WORK UP for HUS [[facility]] Phospholipase A2 Receptor AB/ S Phospholipase A2 Receptor IFA-NEGATIVE   Oncology History   No history exists.   HISTORY OF PRESENTING ILLNESS:  Kelly Huff 34 y.o.  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  Patient continues to complain of chronic mild-moderate fatigue.  Has not had any problems with dialysis.  No calls from kidney transplant yet.  No headaches.  No fever no chills.  No nausea vomiting.  No diarrhea.  Review of Systems  Constitutional: Positive for malaise/fatigue. Negative for chills, diaphoresis, fever and weight loss.  HENT: Negative for nosebleeds and sore throat.   Eyes: Negative for double vision.  Respiratory: Negative for cough, hemoptysis, sputum production, shortness of breath and wheezing.   Cardiovascular: Negative for chest pain, palpitations, orthopnea and leg swelling.  Gastrointestinal: Negative for abdominal pain, blood in stool, constipation, diarrhea, heartburn, melena, nausea and vomiting.  Genitourinary: Negative for dysuria, frequency and urgency.  Musculoskeletal: Negative for back pain and joint pain.  Skin: Negative.  Negative for itching and rash.  Neurological:  Negative for dizziness, tingling, focal weakness, weakness and headaches.  Endo/Heme/Allergies: Does not bruise/bleed easily.  Psychiatric/Behavioral: Negative for depression. The patient is not nervous/anxious and does not have insomnia.     MEDICAL HISTORY:  Past Medical History:  Diagnosis Date  . Chronic kidney disease   . Diabetes mellitus without complication ([place])   . History of pre-eclampsia 2016   mild  . Hypertension     SURGICAL HISTORY: Past Surgical History:  Procedure Laterality Date  . CESAREAN SECTION    . DILATION AND CURETTAGE OF UTERUS     x 4  . IR FLUORO GUIDE CV LINE RIGHT  12/05/2018  . IR FLUORO GUIDE CV LINE RIGHT  12/13/2018  . IR REMOVAL TUN CV CATH W/O FL  02/22/2019  . IR US GUIDE VASC ACCESS RIGHT  12/05/2018  . IR US GUIDE VASC ACCESS RIGHT  12/13/2018    SOCIAL HISTORY: Social History   Socioeconomic History  . Marital status: Married    Spouse name: Not on file  . Number of children: Not on file  . Years of education: Not on file  . Highest education level: Not on file  Occupational History  . Not on file  Tobacco Use  . Smoking status: Never Smoker  . Smokeless tobacco: Never Used  Substance and Sexual Activity  . Alcohol use: No  . Drug use: No  . Sexual activity: Not Currently    Birth control/protection: None, Surgical    Comment: tubial  Other Topics Concern  . Not on file  Social History Narrative  . Not on file   Social Determinants of Health   Financial Resource Strain:   . Difficulty of Paying Living Expenses: Not on file  Food Insecurity:   . Worried  About Running Out of Food in the Last Year: Not on file  . Ran Out of Food in the Last Year: Not on file  Transportation Needs:   . Lack of Transportation (Medical): Not on file  . Lack of Transportation (Non-Medical): Not on file  Physical Activity:   . Days of Exercise per Week: Not on file  . Minutes of Exercise per Session: Not on file  Stress:   . Feeling of  Stress : Not on file  Social Connections:   . Frequency of Communication with Friends and Family: Not on file  . Frequency of Social Gatherings with Friends and Family: Not on file  . Attends Religious Services: Not on file  . Active Member of Clubs or Organizations: Not on file  . Attends Archivist Meetings: Not on file  . Marital Status: Not on file  Intimate Partner Violence:   . Fear of Current or Ex-Partner: Not on file  . Emotionally Abused: Not on file  . Physically Abused: Not on file  . Sexually Abused: Not on file    FAMILY HISTORY: Family History  Problem Relation Age of Onset  . Hypertension Mother   . Cancer Mother        Uterine vs cervical (pt unsure),   . Schizophrenia Brother   . Breast cancer Neg Hx     ALLERGIES:  is allergic to morphine; sulfamethoxazole-trimethoprim; and trulicity [dulaglutide].  MEDICATIONS:  Current Outpatient Medications  Medication Sig Dispense Refill  . amLODipine (NORVASC) 10 MG tablet Take 10 mg by mouth every evening.     . B Complex-C-Folic Acid (RENO CAPS) 1 MG CAPS Take 1 mg by mouth daily.    Marland Kitchen buPROPion (WELLBUTRIN XL) 150 MG 24 hr tablet TAKE 1 TABLET BY MOUTH EVERY DAY 90 tablet 2  . calcitRIOL (ROCALTROL) 0.25 MCG capsule Take 0.25 mcg by mouth daily.    . calcium acetate (PHOSLO) 667 MG tablet Take 1,334 mg by mouth 3 (three) times daily with meals.    . cloNIDine (CATAPRES - DOSED IN MG/24 HR) 0.1 mg/24hr patch Place 0.1 mg onto the skin See admin instructions. Apply patch on every Sunday    . cyclobenzaprine (FLEXERIL) 10 MG tablet Take 10 mg by mouth at bedtime as needed for muscle spasms.     Marland Kitchen docusate sodium (COLACE) 100 MG capsule Take 100 mg by mouth 2 (two) times daily.    Marland Kitchen gentamicin cream (GARAMYCIN) 0.1 % Apply 1 application topically See admin instructions. Apply small amount to catheter exit site once daily    . Insulin Human (INSULIN PUMP) SOLN Inject 1 each into the skin continuous. Novolog.  Patient stated she is not sure how much insulin is given into the pump    . lactulose (CHRONULAC) 10 GM/15ML solution Take 20 g by mouth at bedtime as needed (constipation).     . metoprolol succinate (TOPROL-XL) 100 MG 24 hr tablet Take 100 mg by mouth 2 (two) times daily.    Marland Kitchen olmesartan (BENICAR) 40 MG tablet Take 40 mg by mouth every morning.     No current facility-administered medications for this visit.   PHYSICAL EXAMINATION: ECOG PERFORMANCE STATUS: 1 - Symptomatic but completely ambulatory  There were no vitals filed for this visit. There were no vitals filed for this visit.  Physical Exam  Constitutional: She is oriented to person, place, and time and well-developed, well-nourished, and in no distress.  Patient is alone.  HENT:  Head: Normocephalic and atraumatic.  Mouth/Throat: Oropharynx is clear and moist. No oropharyngeal exudate.  Eyes: Pupils are equal, round, and reactive to light.  Cardiovascular: Normal rate and regular rhythm.  Pulmonary/Chest: No respiratory distress. She has no wheezes.  Abdominal: Soft. Bowel sounds are normal. She exhibits no distension and no mass. There is no abdominal tenderness. There is no rebound and no guarding.  Musculoskeletal:        General: No tenderness or edema. Normal range of motion.     Cervical back: Normal range of motion and neck supple.  Neurological: She is alert and oriented to person, place, and time.  Skin: Skin is warm.  Psychiatric: Affect normal.  ;  LABORATORY DATA:  I have reviewed the data as listed Lab Results  Component Value Date   WBC 4.7 05/09/2019   HGB 9.3 (L) 05/09/2019   HCT 28.9 (L) 05/09/2019   MCV 85.8 05/09/2019   PLT 377 05/09/2019   Recent Labs    09/03/18 0447 09/03/18 1042 01/17/19 0958 03/14/19 0932 05/09/19 0923  NA 130*   < > 136 133* 137  K 4.7   < > 4.7 4.1 3.6  CL 90*   < > 96* 98 97*  CO2 19*   < > 24 25 26   GLUCOSE 381*   < > 157* 92 69*  BUN 33*   < > 66* 60* 52*   CREATININE 8.87*   < > 14.96* 13.24* 13.46*  CALCIUM 9.8   < > 9.8 8.9 8.7*  GFRNONAA 5*   < > 3* 3* 3*  GFRAA 6*   < > 3* 4* 4*  PROT 7.2   < > 8.6* 7.2 7.3  ALBUMIN 3.6   < > 4.5 3.7 3.7  AST 16   < > 15 12* 12*  ALT 13   < > 10 10 13   ALKPHOS 130*   < > 126 110 111  BILITOT 0.5   < > 0.5 0.4 0.6  BILIDIR <0.1  --   --   --   --   IBILI NOT CALCULATED  --   --   --   --    < > = values in this interval not displayed.    RADIOGRAPHIC STUDIES: I have personally reviewed the radiological images as listed and agreed with the findings in the report. No results found.  ASSESSMENT & PLAN:   No problem-specific Assessment & Plan notes found for this encounter.  All questions were answered. The patient knows to call the clinic with any problems, questions or concerns.    Cammie Sickle, MD 07/04/2019 9:11 AM

## 2019-07-08 ENCOUNTER — Other Ambulatory Visit: Payer: Self-pay | Admitting: Nurse Practitioner

## 2019-07-11 ENCOUNTER — Telehealth: Payer: Self-pay | Admitting: *Deleted

## 2019-07-11 NOTE — Telephone Encounter (Signed)
1528- Call attempt to reach patient- prescreening covid questions. Call can not be completed at this time.

## 2019-07-12 ENCOUNTER — Encounter: Payer: Self-pay | Admitting: Internal Medicine

## 2019-07-12 ENCOUNTER — Inpatient Hospital Stay (HOSPITAL_BASED_OUTPATIENT_CLINIC_OR_DEPARTMENT_OTHER): Payer: Medicare Other | Admitting: Internal Medicine

## 2019-07-12 ENCOUNTER — Other Ambulatory Visit: Payer: Self-pay

## 2019-07-12 ENCOUNTER — Inpatient Hospital Stay: Payer: Medicare Other

## 2019-07-12 ENCOUNTER — Inpatient Hospital Stay: Payer: Medicare Other | Attending: Internal Medicine

## 2019-07-12 VITALS — BP 146/88 | HR 78 | Temp 97.6°F | Resp 20 | Ht 67.0 in | Wt 184.0 lb

## 2019-07-12 DIAGNOSIS — D593 Hemolytic-uremic syndrome: Secondary | ICD-10-CM

## 2019-07-12 DIAGNOSIS — O99019 Anemia complicating pregnancy, unspecified trimester: Secondary | ICD-10-CM | POA: Diagnosis not present

## 2019-07-12 DIAGNOSIS — Z794 Long term (current) use of insulin: Secondary | ICD-10-CM | POA: Diagnosis not present

## 2019-07-12 DIAGNOSIS — Z7682 Awaiting organ transplant status: Secondary | ICD-10-CM | POA: Insufficient documentation

## 2019-07-12 DIAGNOSIS — I120 Hypertensive chronic kidney disease with stage 5 chronic kidney disease or end stage renal disease: Secondary | ICD-10-CM | POA: Diagnosis not present

## 2019-07-12 DIAGNOSIS — D509 Iron deficiency anemia, unspecified: Secondary | ICD-10-CM

## 2019-07-12 DIAGNOSIS — D5939 Other hemolytic-uremic syndrome: Secondary | ICD-10-CM

## 2019-07-12 DIAGNOSIS — N186 End stage renal disease: Secondary | ICD-10-CM | POA: Insufficient documentation

## 2019-07-12 DIAGNOSIS — Z5111 Encounter for antineoplastic chemotherapy: Secondary | ICD-10-CM | POA: Insufficient documentation

## 2019-07-12 DIAGNOSIS — Z992 Dependence on renal dialysis: Secondary | ICD-10-CM | POA: Insufficient documentation

## 2019-07-12 DIAGNOSIS — E1122 Type 2 diabetes mellitus with diabetic chronic kidney disease: Secondary | ICD-10-CM | POA: Insufficient documentation

## 2019-07-12 DIAGNOSIS — Z79899 Other long term (current) drug therapy: Secondary | ICD-10-CM | POA: Diagnosis not present

## 2019-07-12 LAB — CBC WITH DIFFERENTIAL/PLATELET
Abs Immature Granulocytes: 0.02 10*3/uL (ref 0.00–0.07)
Basophils Absolute: 0 10*3/uL (ref 0.0–0.1)
Basophils Relative: 1 %
Eosinophils Absolute: 0.3 10*3/uL (ref 0.0–0.5)
Eosinophils Relative: 5 %
HCT: 26.6 % — ABNORMAL LOW (ref 36.0–46.0)
Hemoglobin: 8.8 g/dL — ABNORMAL LOW (ref 12.0–15.0)
Immature Granulocytes: 0 %
Lymphocytes Relative: 20 %
Lymphs Abs: 1.2 10*3/uL (ref 0.7–4.0)
MCH: 28.7 pg (ref 26.0–34.0)
MCHC: 33.1 g/dL (ref 30.0–36.0)
MCV: 86.6 fL (ref 80.0–100.0)
Monocytes Absolute: 0.5 10*3/uL (ref 0.1–1.0)
Monocytes Relative: 7 %
Neutro Abs: 4.1 10*3/uL (ref 1.7–7.7)
Neutrophils Relative %: 67 %
Platelets: 288 10*3/uL (ref 150–400)
RBC: 3.07 MIL/uL — ABNORMAL LOW (ref 3.87–5.11)
RDW: 13.3 % (ref 11.5–15.5)
WBC: 6.1 10*3/uL (ref 4.0–10.5)
nRBC: 0 % (ref 0.0–0.2)

## 2019-07-12 LAB — COMPREHENSIVE METABOLIC PANEL
ALT: 11 U/L (ref 0–44)
AST: 10 U/L — ABNORMAL LOW (ref 15–41)
Albumin: 3.6 g/dL (ref 3.5–5.0)
Alkaline Phosphatase: 100 U/L (ref 38–126)
Anion gap: 14 (ref 5–15)
BUN: 59 mg/dL — ABNORMAL HIGH (ref 6–20)
CO2: 23 mmol/L (ref 22–32)
Calcium: 8 mg/dL — ABNORMAL LOW (ref 8.9–10.3)
Chloride: 99 mmol/L (ref 98–111)
Creatinine, Ser: 15.12 mg/dL — ABNORMAL HIGH (ref 0.44–1.00)
GFR calc Af Amer: 3 mL/min — ABNORMAL LOW (ref >60)
GFR calc non Af Amer: 3 mL/min — ABNORMAL LOW (ref >60)
Glucose, Bld: 220 mg/dL — ABNORMAL HIGH (ref 70–99)
Potassium: 4.5 mmol/L (ref 3.5–5.1)
Sodium: 136 mmol/L (ref 135–145)
Total Bilirubin: 0.4 mg/dL (ref 0.3–1.2)
Total Protein: 6.5 g/dL (ref 6.5–8.1)

## 2019-07-12 LAB — LACTATE DEHYDROGENASE: LDH: 161 U/L (ref 98–192)

## 2019-07-12 MED ORDER — SODIUM CHLORIDE 0.9 % IV SOLN
3300.0000 mg | INTRAVENOUS | Status: DC
  Administered 2019-07-12: 3300 mg via INTRAVENOUS
  Filled 2019-07-12: qty 330

## 2019-07-12 MED ORDER — SODIUM CHLORIDE 0.9 % IV SOLN
Freq: Once | INTRAVENOUS | Status: AC
  Filled 2019-07-12: qty 250

## 2019-07-12 NOTE — Progress Notes (Signed)
[facility] NOTE  Patient Care Team: Rubie Maid, MD as PCP - General (Obstetrics and Gynecology) [name], FNP as Consulting Physician (Family Medicine) Azzie Glatter, MD as Consulting Physician (Internal Medicine) Sherryll Burger, MD as Consulting Physician (Nephrology)  CHIEF COMPLAINTS/PURPOSE OF CONSULTATION: Atypical HUS  # ATYPICAL HEMOLYTIC UREMIC SYNDROME [April 2019-Dx; [facility]; Dr.Arepally;s/p Plex x1; April 26th- Ecluzimab 1200 mg q 2W; march 25th 2020- Ultomiris q  8 weeks.   # AKI/CKD [name] 2018;FEB 2020- hemodialysis [[facility] in [place]; Tuesday Thursday Saturday]; May 2020-peritoneal dialysis  # IDDM [since age of 70- Dr.Kerr,/Endo]  # WORK UP for HUS [[facility]] Phospholipase A2 Receptor AB/ S Phospholipase A2 Receptor IFA-NEGATIVE   Oncology History   No history exists.   HISTORY OF PRESENTING ILLNESS:  Kelly Huff 34 y.o.  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  Patient continues to get peritoneal dialysis-without any complications.  Patient is discouraged the fact that she cannot have kidney and pancreas transplant-given previous antibodies/transfusions.  No headaches.  No fever no chills.  No nausea vomiting.  No diarrhea.  Review of Systems  Constitutional: Positive for malaise/fatigue. Negative for chills, diaphoresis, fever and weight loss.  HENT: Negative for nosebleeds and sore throat.   Eyes: Negative for double vision.  Respiratory: Negative for cough, hemoptysis, sputum production, shortness of breath and wheezing.   Cardiovascular: Negative for chest pain, palpitations, orthopnea and leg swelling.  Gastrointestinal: Negative for abdominal pain, blood in stool, constipation, diarrhea, heartburn, melena, nausea and vomiting.  Genitourinary: Negative for dysuria, frequency and urgency.  Musculoskeletal:  Negative for back pain and joint pain.  Skin: Negative.  Negative for itching and rash.  Neurological: Negative for dizziness, tingling, focal weakness, weakness and headaches.  Endo/Heme/Allergies: Does not bruise/bleed easily.  Psychiatric/Behavioral: Negative for depression. The patient is not nervous/anxious and does not have insomnia.     MEDICAL HISTORY:  Past Medical History:  Diagnosis Date  . Chronic kidney disease   . Diabetes mellitus without complication ([place])   . History of pre-eclampsia 2016   mild  . Hypertension     SURGICAL HISTORY: Past Surgical History:  Procedure Laterality Date  . CESAREAN SECTION    . DILATION AND CURETTAGE OF UTERUS     x 4  . IR FLUORO GUIDE CV LINE RIGHT  12/05/2018  . IR FLUORO GUIDE CV LINE RIGHT  12/13/2018  . IR REMOVAL TUN CV CATH W/O FL  02/22/2019  . IR US GUIDE VASC ACCESS RIGHT  12/05/2018  . IR US GUIDE VASC ACCESS RIGHT  12/13/2018    SOCIAL HISTORY: Social History   Socioeconomic History  . Marital status: Married    Spouse name: Not on file  . Number of children: Not on file  . Years of education: Not on file  . Highest education level: Not on file  Occupational History  . Not on file  Tobacco Use  . Smoking status: Never Smoker  . Smokeless tobacco: Never Used  Substance and Sexual Activity  . Alcohol use: No  . Drug use: No  . Sexual activity: Not Currently    Birth control/protection: None, Surgical    Comment: tubial  Other Topics Concern  . Not on file  Social History Narrative  . Not on file   Social Determinants of Health   Financial Resource Strain:   . Difficulty of Paying Living Expenses:   Food  Insecurity:   . Worried About Charity fundraiser in the Last Year:   . Arboriculturist in the Last Year:   Transportation Needs:   . Film/video editor (Medical):   Marland Kitchen Lack of Transportation (Non-Medical):   Physical Activity:   . Days of Exercise per Week:   . Minutes of Exercise per Session:    Stress:   . Feeling of Stress :   Social Connections:   . Frequency of Communication with Friends and Family:   . Frequency of Social Gatherings with Friends and Family:   . Attends Religious Services:   . Active Member of Clubs or Organizations:   . Attends Archivist Meetings:   Marland Kitchen Marital Status:   Intimate Partner Violence:   . Fear of Current or Ex-Partner:   . Emotionally Abused:   Marland Kitchen Physically Abused:   . Sexually Abused:     FAMILY HISTORY: Family History  Problem Relation Age of Onset  . Hypertension Mother   . Cancer Mother        Uterine vs cervical (pt unsure),   . Schizophrenia Brother   . Breast cancer Neg Hx     ALLERGIES:  is allergic to morphine; sulfamethoxazole-trimethoprim; and trulicity [dulaglutide].  MEDICATIONS:  Current Outpatient Medications  Medication Sig Dispense Refill  . amLODipine (NORVASC) 10 MG tablet Take 10 mg by mouth every evening.     . B Complex-C-Folic Acid (RENO CAPS) 1 MG CAPS Take 1 mg by mouth daily.    Marland Kitchen buPROPion (WELLBUTRIN XL) 150 MG 24 hr tablet TAKE 1 TABLET BY MOUTH EVERY DAY 90 tablet 2  . calcitRIOL (ROCALTROL) 0.25 MCG capsule Take 0.25 mcg by mouth daily.    . calcium acetate (PHOSLO) 667 MG tablet Take 1,334 mg by mouth 3 (three) times daily with meals.    . cloNIDine (CATAPRES - DOSED IN MG/24 HR) 0.1 mg/24hr patch Place 0.1 mg onto the skin See admin instructions. Apply patch on every Sunday    . cyclobenzaprine (FLEXERIL) 10 MG tablet Take 10 mg by mouth at bedtime as needed for muscle spasms.     . Insulin Human (INSULIN PUMP) SOLN Inject 1 each into the skin continuous. Novolog. Patient stated she is not sure how much insulin is given into the pump    . metoprolol succinate (TOPROL-XL) 100 MG 24 hr tablet Take 100 mg by mouth 2 (two) times daily.    Marland Kitchen docusate sodium (COLACE) 100 MG capsule Take 100 mg by mouth 2 (two) times daily.    Marland Kitchen gentamicin cream (GARAMYCIN) 0.1 % Apply 1 application topically See  admin instructions. Apply small amount to catheter exit site once daily    . lactulose (CHRONULAC) 10 GM/15ML solution Take 20 g by mouth at bedtime as needed (constipation).     Marland Kitchen olmesartan (BENICAR) 40 MG tablet Take 40 mg by mouth every morning.     No current facility-administered medications for this visit.   PHYSICAL EXAMINATION: ECOG PERFORMANCE STATUS: 1 - Symptomatic but completely ambulatory  Vitals:   07/12/19 0945  BP: (!) 146/88  Pulse: 78  Resp: 20  Temp: 97.6 F (36.4 C)   Filed Weights   07/12/19 0956  Weight: 184 lb (83.5 kg)    Physical Exam  Constitutional: She is oriented to person, place, and time and well-developed, well-nourished, and in no distress.  Patient is alone.  HENT:  Head: Normocephalic and atraumatic.  Mouth/Throat: Oropharynx is clear and moist. No  oropharyngeal exudate.  Eyes: Pupils are equal, round, and reactive to light.  Cardiovascular: Normal rate and regular rhythm.  Pulmonary/Chest: No respiratory distress. She has no wheezes.  Abdominal: Soft. Bowel sounds are normal. She exhibits no distension and no mass. There is no abdominal tenderness. There is no rebound and no guarding.  Musculoskeletal:        General: No tenderness or edema. Normal range of motion.     Cervical back: Normal range of motion and neck supple.  Neurological: She is alert and oriented to person, place, and time.  Skin: Skin is warm.  Psychiatric: Affect normal.  ;  LABORATORY DATA:  I have reviewed the data as listed Lab Results  Component Value Date   WBC 6.1 07/12/2019   HGB 8.8 (L) 07/12/2019   HCT 26.6 (L) 07/12/2019   MCV 86.6 07/12/2019   PLT 288 07/12/2019   Recent Labs    09/03/18 0447 09/03/18 1042 03/14/19 0932 05/09/19 0923 07/12/19 0916  NA 130*   < > 133* 137 136  K 4.7   < > 4.1 3.6 4.5  CL 90*   < > 98 97* 99  CO2 19*   < > 25 26 23   GLUCOSE 381*   < > 92 69* 220*  BUN 33*   < > 60* 52* 59*  CREATININE 8.87*   < > 13.24*  13.46* 15.12*  CALCIUM 9.8   < > 8.9 8.7* 8.0*  GFRNONAA 5*   < > 3* 3* 3*  GFRAA 6*   < > 4* 4* 3*  PROT 7.2   < > 7.2 7.3 6.5  ALBUMIN 3.6   < > 3.7 3.7 3.6  AST 16   < > 12* 12* 10*  ALT 13   < > 10 13 11   ALKPHOS 130*   < > 110 111 100  BILITOT 0.5   < > 0.4 0.6 0.4  BILIDIR <0.1  --   --   --   --   IBILI NOT CALCULATED  --   --   --   --    < > = values in this interval not displayed.    RADIOGRAPHIC STUDIES: I have personally reviewed the radiological images as listed and agreed with the findings in the report. No results found.  ASSESSMENT & PLAN:   HUS (hemolytic uremic syndrome), atypical ([place]) # Atypical hemolytic uremic syndrome -on Ultimoris infusions.  Stable.  Hb -9.3  Platelets/LDH are normal.  Continue Ultimoris infusions every 8 weeks; continue indefinitely at this time. Stable. .   # Anemia- multifactorial- 8.8; likely secondary to ESRD  on procrit/Iron  prn per nephrology.   # ESRD- on PD- STABLE;  awaiting transplant list.  #Hypertension-140s- STABLE.  # DISPOSITION: # Treatment today # follow up in 8 weeks- MD- labs-cbc.cmp/ldh;Iron studies/ferritin/ Ultomiris-Dr.B  All questions were answered. The patient knows to call the clinic with any problems, questions or concerns.    Cammie Sickle, MD 07/19/2019 7:03 AM

## 2019-07-12 NOTE — Assessment & Plan Note (Addendum)
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  Stable.  Hb -8.8 [see below]  Platelets/LDH are normal.  Continue Ultimoris infusions every 8 weeks; continue indefinitely at this time. Stable. .   # Anemia- multifactorial- 8.8 [do not suspect secondary to atypical HUS]; likely secondary to ESRD  on procrit/Iron  prn per nephrology.   # ESRD- on PD- STABLE;  awaiting transplant list.  Reviewed the [facility] transplant recommendations  #Hypertension-140s- STABLE.  # DISPOSITION: # Treatment today # follow up in 8 weeks- MD- labs-cbc.cmp/ldh;Iron studies/ferritin/haptoglobin Ultomiris-Dr.B

## 2019-07-19 ENCOUNTER — Other Ambulatory Visit: Payer: Self-pay | Admitting: Internal Medicine

## 2019-07-26 ENCOUNTER — Telehealth: Payer: Self-pay | Admitting: Obstetrics and Gynecology

## 2019-07-26 DIAGNOSIS — Z8481 Family history of carrier of genetic disease: Secondary | ICD-10-CM

## 2019-07-26 DIAGNOSIS — N179 Acute kidney failure, unspecified: Secondary | ICD-10-CM

## 2019-07-26 DIAGNOSIS — N1831 Chronic kidney disease, stage 3a: Secondary | ICD-10-CM

## 2019-07-26 NOTE — Telephone Encounter (Signed)
Patient called in saying she needs a mammogram done. I informed patient that we don't do mammograms in our clinic that she would need to be referred out and that we need an order put in for a mammogram. I informed patient that I would leave a message for Dr. Andreas Blower nurse. Patient said she is a kidney transplant patient and needs a mammogram annually.

## 2019-07-28 IMAGING — DX PORTABLE CHEST - 1 VIEW
1 series · 1 of 1 positions shown · non-contrast
Comparison: None.

CLINICAL DATA: Altered mental status

EXAM:
PORTABLE CHEST 1 VIEW

[chest ap]
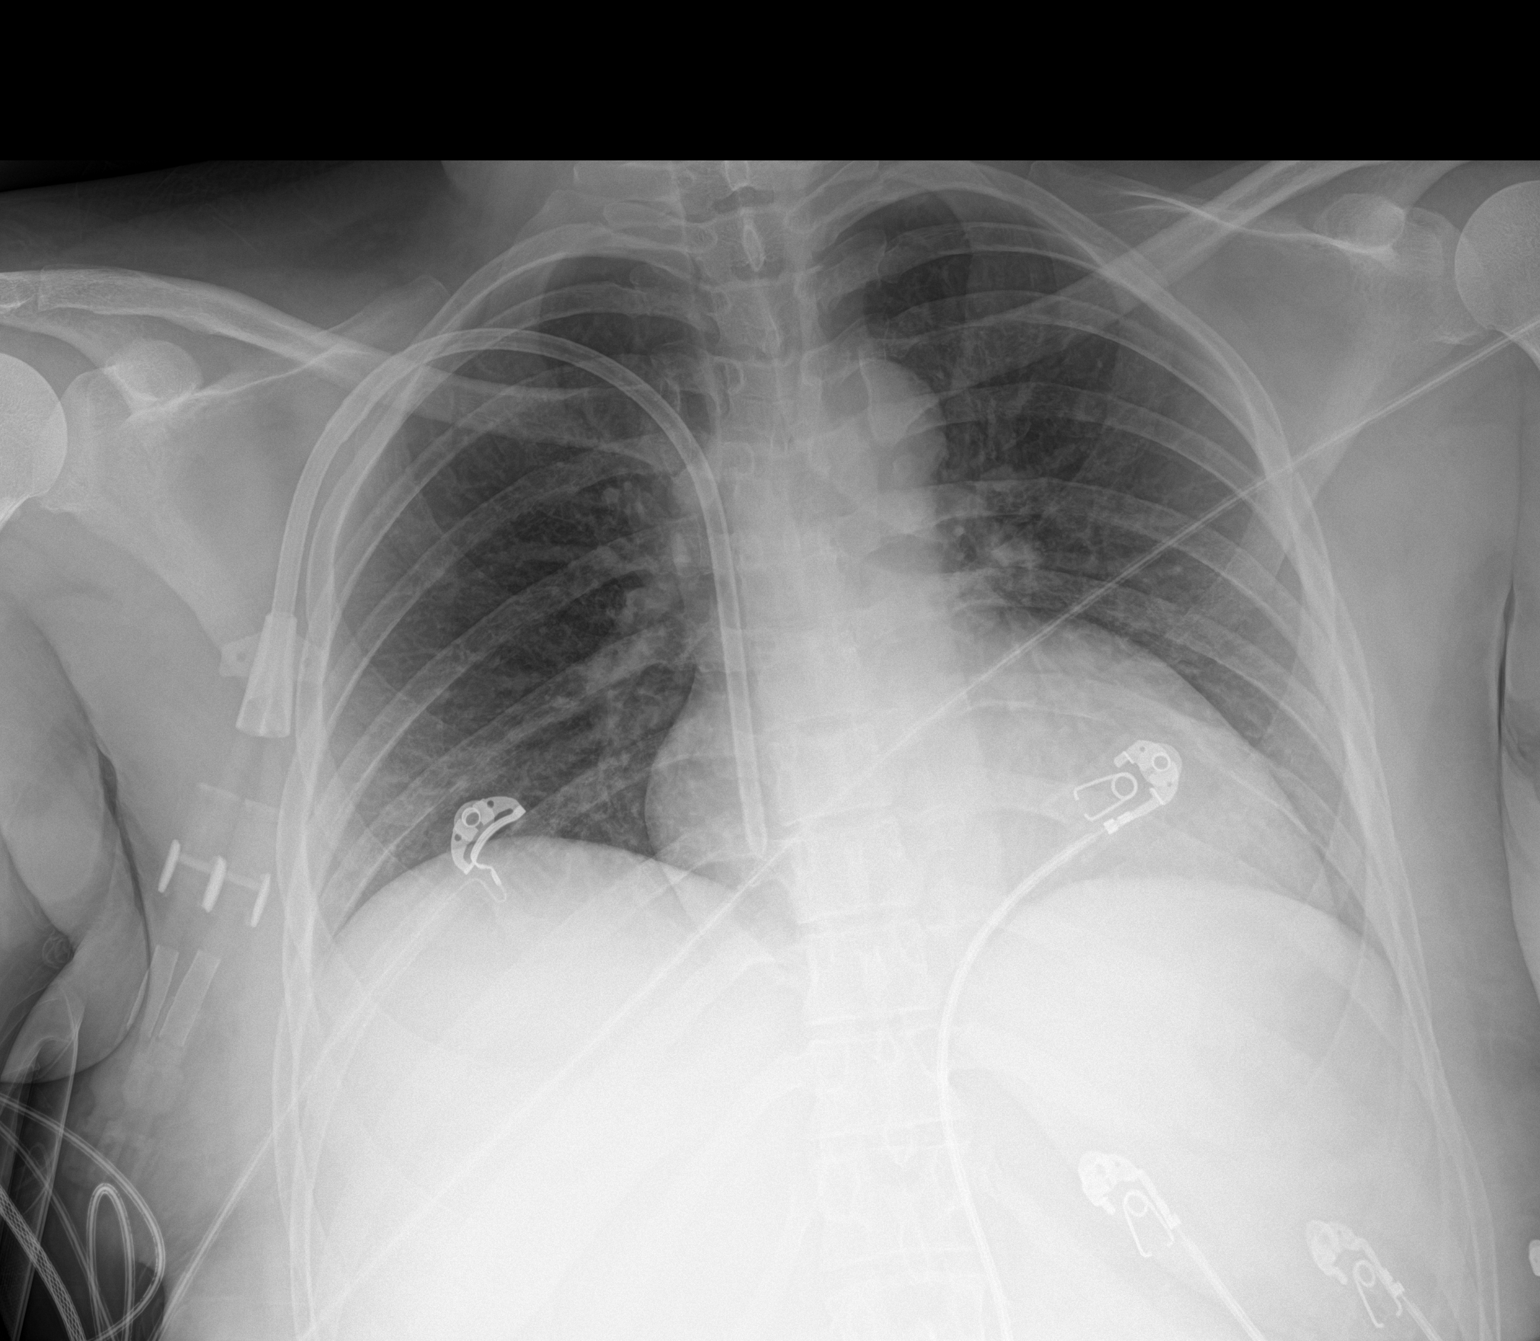

[1 of 1 positions shown; findings below may reference images not displayed]

FINDINGS: Cardiac shadow is at the upper limits of normal in size. Dialysis
catheter is noted in satisfactory position. The lungs are clear. No
bony abnormality is noted.
IMPRESSION: No active disease.

## 2019-07-28 IMAGING — CT CT ABDOMEN AND PELVIS WITH CONTRAST
2 of 4 series · 16 of 46 positions shown, 18 images · IV contrast (APPLIED)
Comparison: CT of the abdomen pelvis dated 07/27/2018

CLINICAL DATA: 32-year-old female with abdominal pain, nausea
vomiting.

EXAM:
CT ABDOMEN AND PELVIS WITH CONTRAST
TECHNIQUE: Multidetector CT imaging of the abdomen and pelvis was performed
using the standard protocol following bolus administration of
intravenous contrast.
CONTRAST:  100mL OMNIPAQUE IOHEXOL 300 MG/ML  SOLN

[Series 3: abd/ pelvis 5.0 i30f 2 · axial · 0.65mm/px · z∈[+768,+1228]mm · 13 of 102 slices shown, 15 images]
[im 5/102  soft-tissue]
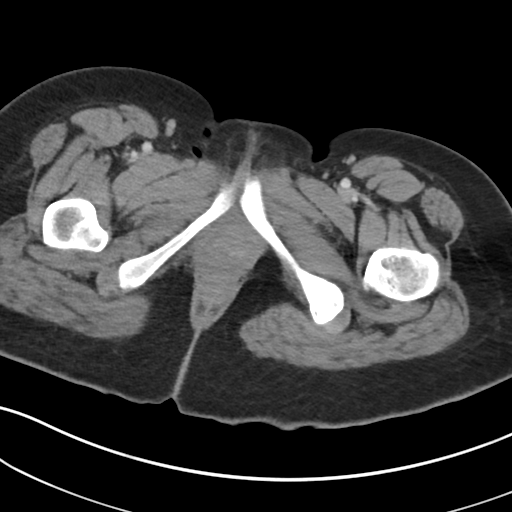
[im 5/102  bone]
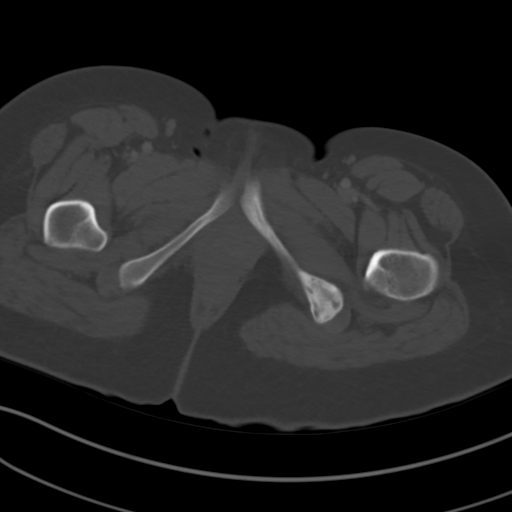
[im 13/102  soft-tissue]
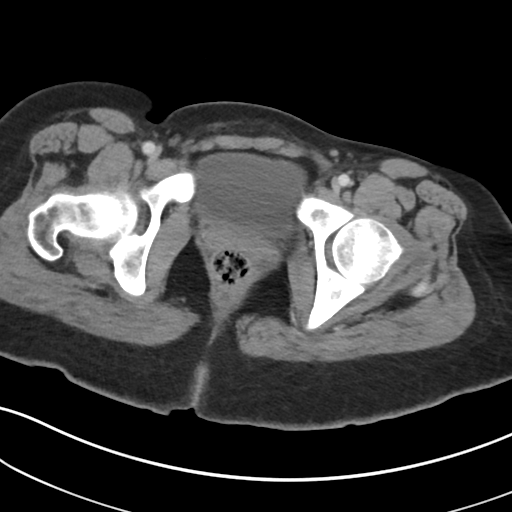
[im 22/102  soft-tissue]
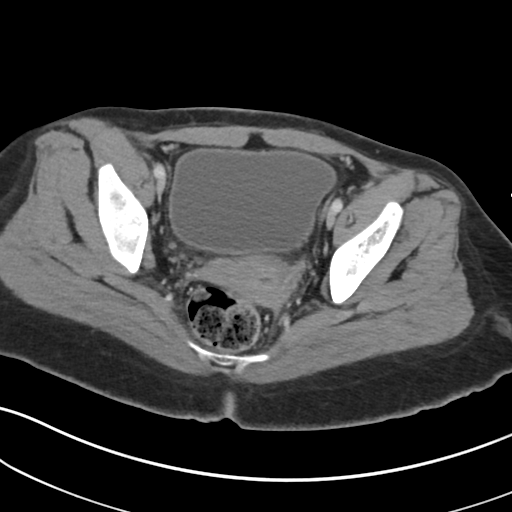
[im 30/102  soft-tissue]
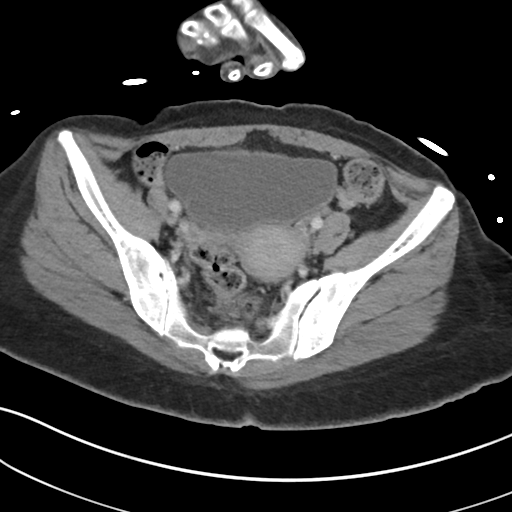
[im 34/102  soft-tissue]
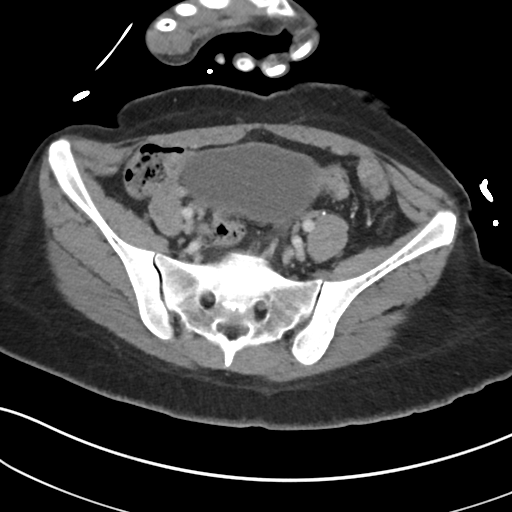
[im 43/102  soft-tissue]
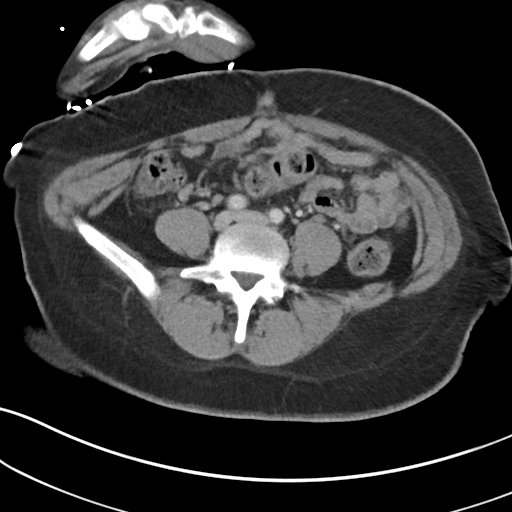
[im 51/102  soft-tissue]
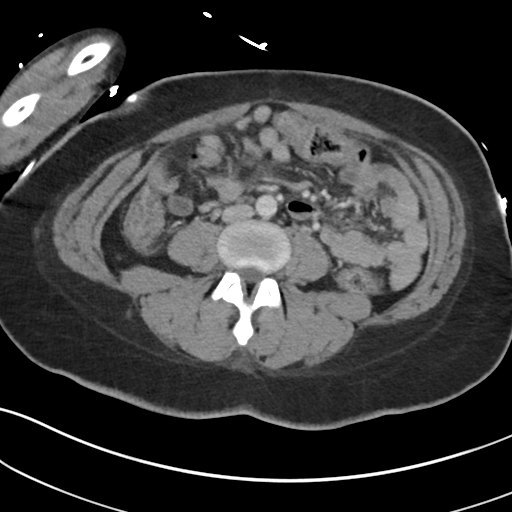
[im 59/102  soft-tissue]
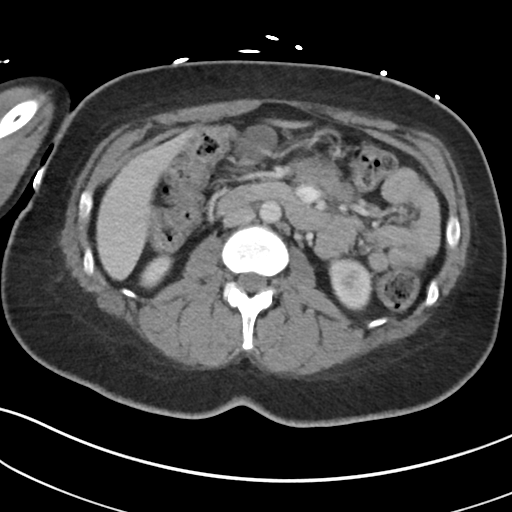
[im 68/102  soft-tissue]
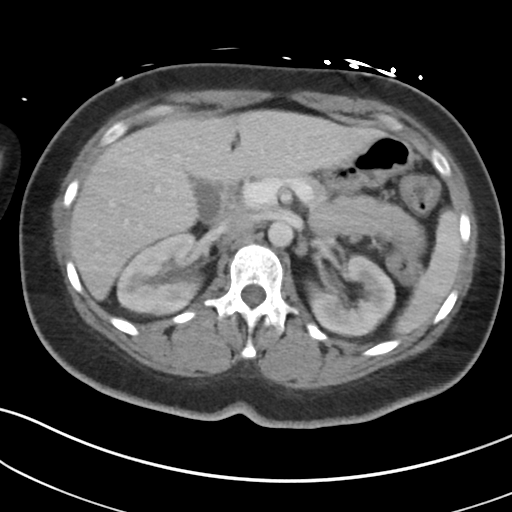
[im 68/102  bone]
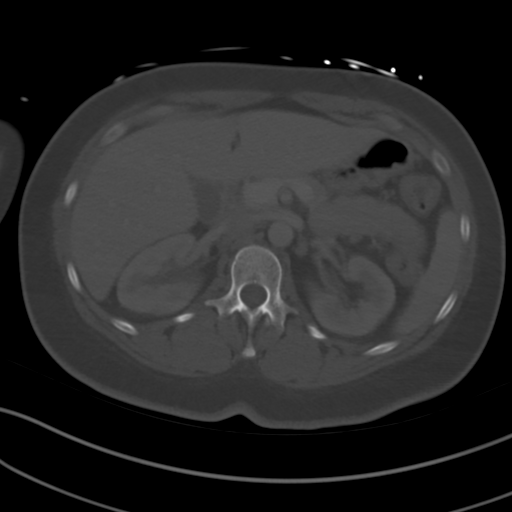
[im 72/102  soft-tissue]
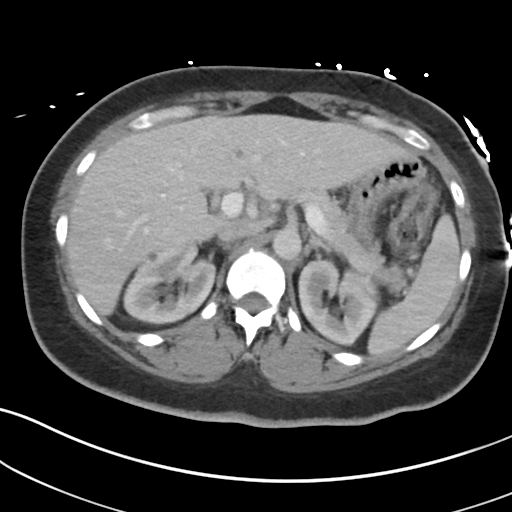
[im 80/102  soft-tissue]
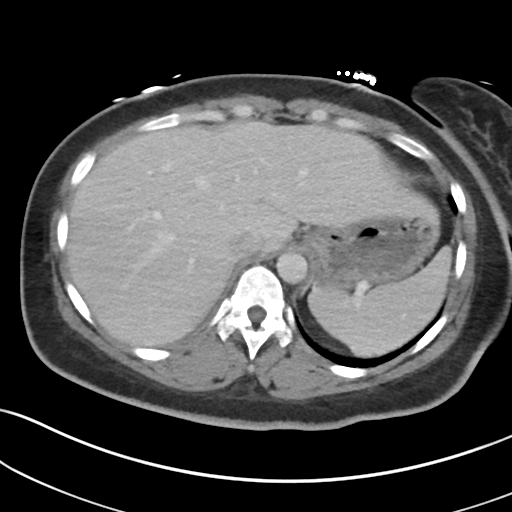
[im 89/102  soft-tissue]
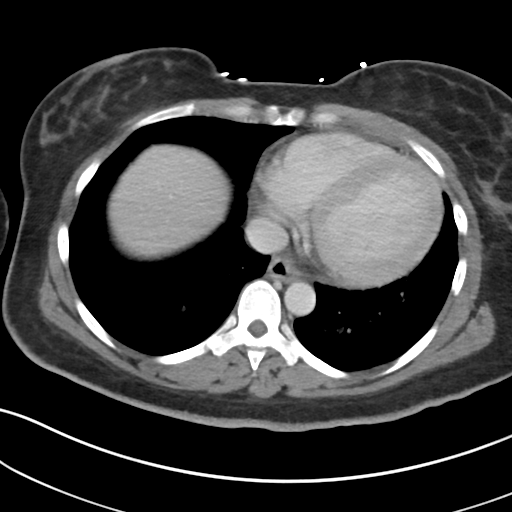
[im 97/102  soft-tissue]
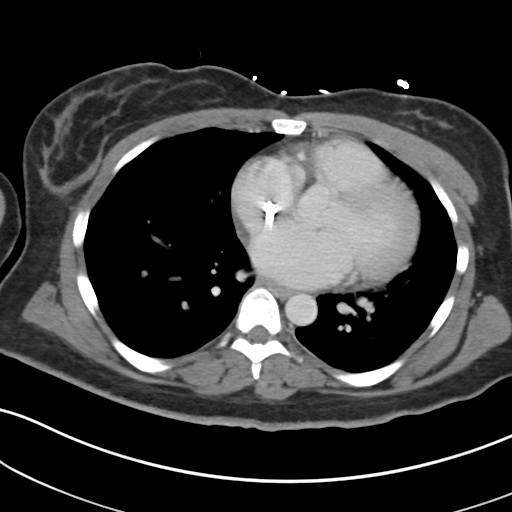

[Series 6: coronal soft tissue · coronal · 0.80mm/px · 3 of 100 slices shown]
[im 34/100  soft-tissue]
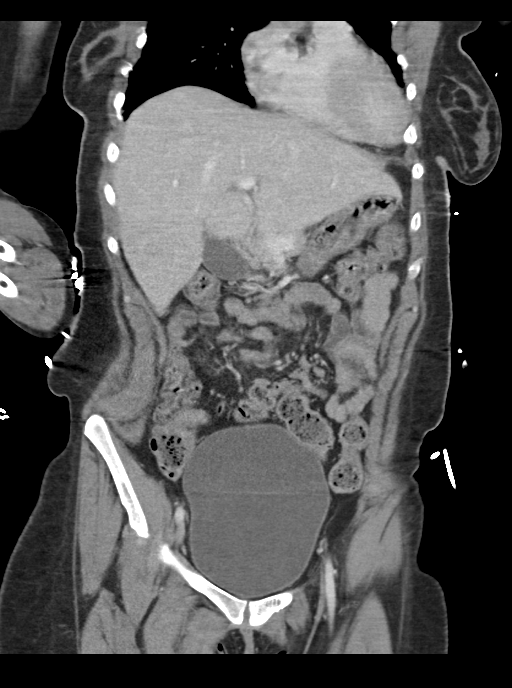
[im 45/100  soft-tissue]
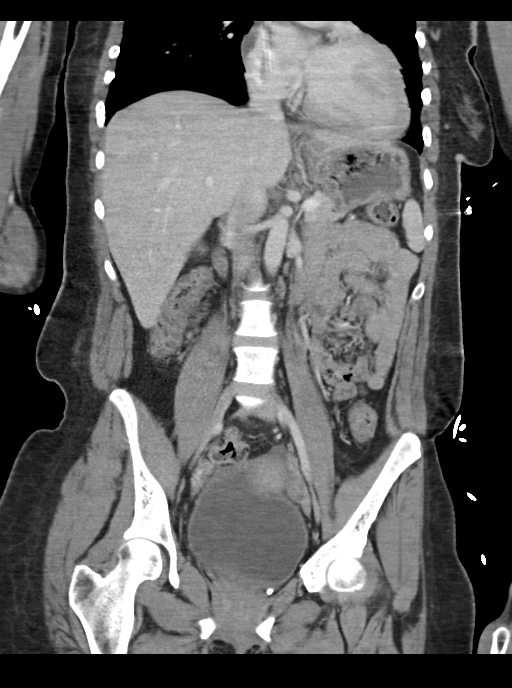
[im 56/100  soft-tissue]
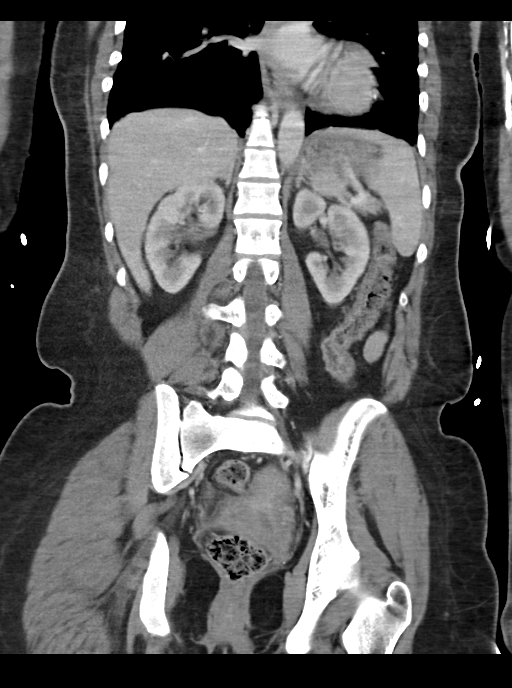

[16 of 46 positions shown; findings below may reference images not displayed]

FINDINGS: Lower chest: There is mild cardiomegaly. The tip of central line
noted in the right atrium, likely related to a dialysis catheter.
The visualized lung bases are clear.

No intra-abdominal free air or free fluid.

Hepatobiliary: No focal liver abnormality is seen. No gallstones,
gallbladder wall thickening, or biliary dilatation.

Pancreas: Unremarkable. No pancreatic ductal dilatation or
surrounding inflammatory changes.

Spleen: Normal in size without focal abnormality.

Adrenals/Urinary Tract: The adrenal glands are unremarkable. There
is no hydronephrosis on either side. Subcentimeter right renal
hypodense focus is too small to characterize. The visualized ureters
and urinary bladder appear unremarkable.

Stomach/Bowel: There is no bowel obstruction or active inflammation.
The appendix contains appendicoliths otherwise unremarkable.

Vascular/Lymphatic: No significant vascular findings are present. No
enlarged abdominal or pelvic lymph nodes.

Reproductive: Uterus and bilateral adnexa are unremarkable.

Other: None

Musculoskeletal: No acute osseous pathology. Coarsened and thickened
appearance of the left pelvic bone similar to prior study.
IMPRESSION: No acute intra-abdominal or pelvic pathology. Interval resolution of
the previously seen bowel inflammation.

## 2019-07-30 IMAGING — RF LUMBAR PUNCTURE FLUORO GUIDE
1 series · 1 of 1 positions shown · non-contrast
Comparison: none

CLINICAL DATA: Confusion and headache.

[Series 1: cp_standard · 0.17mm/px · 1 of 1 slices shown]
[im 1/1]
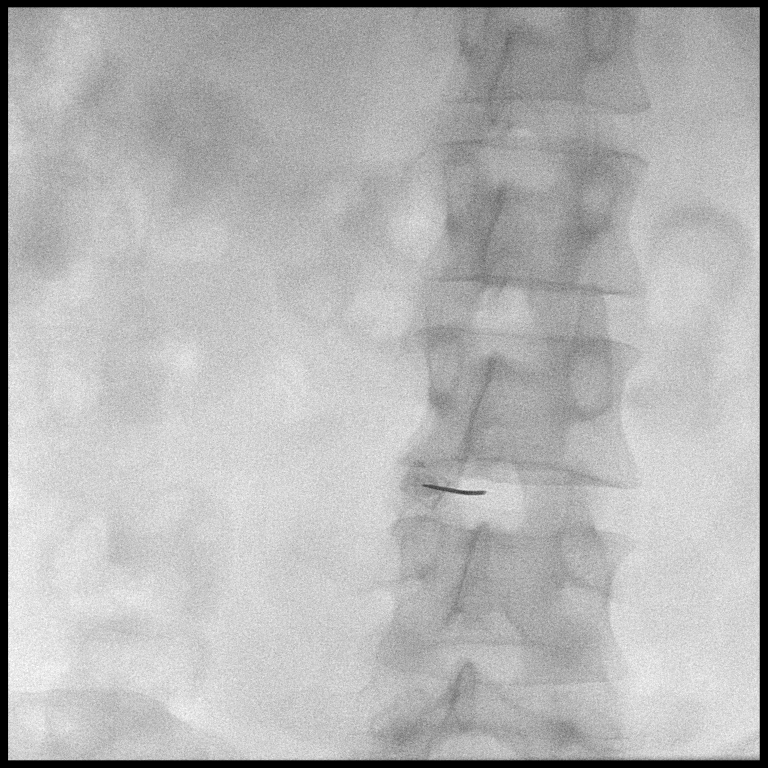

[1 of 1 positions shown; findings below may reference images not displayed]

EXAM:
DIAGNOSTIC LUMBAR PUNCTURE UNDER FLUOROSCOPIC GUIDANCE

FLUOROSCOPY TIME:  Fluoroscopy Time:  0 minutes, 18 seconds

Radiation Exposure Index (if provided by the fluoroscopic device):
2.6 mGy

Number of Acquired Spot Images: 0

PROCEDURE:
I discussed the risks (including hemorrhage, infection, headache,
and nerve damage, among others), benefits, and alternatives to
fluoroscopically guided lumbar puncture with the patient. We
specifically discussed the high technical likelihood of success of
the procedure. The patient understood and elected to undergo the
procedure.

Standard time-out was employed. Following sterile skin prep and
local anesthetic administration consisting of 1 percent lidocaine, a
20 gauge spinal needle was advanced without difficulty into the
thecal sac at the at the L3-4 level under fluoroscopic guidance.
Clear CSF was returned. The patient did not want to turn on her side
to obtain the opening pressure, and grew tearfull. Accordingly,
opening pressure was obtained with the patient prone. Initially the
opening pressure was about 35 cm of water, but this appeared to
reduce as the patient calmed down, and ended up at about 25 cm of
water.

12 cc of clear CSF was collected. The needle was subsequently
removed and the skin cleansed and bandaged. No immediate
complications were observed.
IMPRESSION: 1. Technically successful fluoroscopically guided lumbar puncture at
the L3-4 level yielding 12 cc of clear CSF. Initially the opening
pressure seems significantly elevated at about 35 cm of water,
although the patient was prone and somewhat tearful/agitated. Once
the patient calmed down the CSF pressure reduced to 25 cm of water.

## 2019-08-04 NOTE — Telephone Encounter (Signed)
No.  We can place a mammogram order.  She has a family history of BRCA gene positive in her mother.   She should start with a routine baseline mammogram. Also, I know she is planning to have a kidney transplant and they want to make sure she is not a risk for anything before she undergoes the procedure.   Dr. 

## 2019-08-04 NOTE — Telephone Encounter (Signed)
Called pt no answer unable to lm via vm due to the number on file is disconnected. Sent pt a mychart message to get more information and why she needs a mammogram. Havent heard from her yet.  I asked you about her the day that I got the message if you wanted to see the pt or send in a mammogram order. Please advise. Thanks [employer]

## 2019-08-04 NOTE — Telephone Encounter (Signed)
Pt was called using number on file number is disconnected. Will send mychart message.

## 2019-08-07 NOTE — Addendum Note (Signed)
Addended by: Edwyna Shell on: 08/07/2019 04:00 PM   Modules accepted: Orders

## 2019-08-07 NOTE — Addendum Note (Signed)
Addended by: Augusto Gamble on: 08/07/2019 06:33 PM   Modules accepted: Orders

## 2019-08-07 NOTE — Telephone Encounter (Signed)
I fixed it.  Order was placed.  Hopefully she should not receive a bill, but with Medicare it is possible due to her age.

## 2019-08-07 NOTE — Telephone Encounter (Signed)
Hello Dr.Cherry, I tried to do the mammogram order but it was asking to review the diagnosis or sign a wavier form. Please help me. I just want to make sure that I do it correctly. Thanks [employer]

## 2019-08-30 ENCOUNTER — Encounter: Payer: Medicare Other | Admitting: Obstetrics and Gynecology

## 2019-08-30 NOTE — Progress Notes (Signed)

## 2019-09-06 ENCOUNTER — Encounter: Payer: Self-pay | Admitting: Internal Medicine

## 2019-09-06 ENCOUNTER — Inpatient Hospital Stay: Payer: Medicare Other | Attending: Internal Medicine | Admitting: Internal Medicine

## 2019-09-06 ENCOUNTER — Inpatient Hospital Stay: Payer: Medicare Other

## 2019-09-06 ENCOUNTER — Other Ambulatory Visit: Payer: Self-pay

## 2019-09-06 DIAGNOSIS — D5939 Other hemolytic-uremic syndrome: Secondary | ICD-10-CM

## 2019-09-06 DIAGNOSIS — Z794 Long term (current) use of insulin: Secondary | ICD-10-CM | POA: Diagnosis not present

## 2019-09-06 DIAGNOSIS — I120 Hypertensive chronic kidney disease with stage 5 chronic kidney disease or end stage renal disease: Secondary | ICD-10-CM | POA: Diagnosis not present

## 2019-09-06 DIAGNOSIS — Z992 Dependence on renal dialysis: Secondary | ICD-10-CM | POA: Insufficient documentation

## 2019-09-06 DIAGNOSIS — E1122 Type 2 diabetes mellitus with diabetic chronic kidney disease: Secondary | ICD-10-CM | POA: Insufficient documentation

## 2019-09-06 DIAGNOSIS — Z79899 Other long term (current) drug therapy: Secondary | ICD-10-CM | POA: Insufficient documentation

## 2019-09-06 DIAGNOSIS — N186 End stage renal disease: Secondary | ICD-10-CM | POA: Insufficient documentation

## 2019-09-06 DIAGNOSIS — D593 Hemolytic-uremic syndrome: Secondary | ICD-10-CM

## 2019-09-06 DIAGNOSIS — Z7682 Awaiting organ transplant status: Secondary | ICD-10-CM | POA: Insufficient documentation

## 2019-09-06 DIAGNOSIS — Z95828 Presence of other vascular implants and grafts: Secondary | ICD-10-CM

## 2019-09-06 DIAGNOSIS — D509 Iron deficiency anemia, unspecified: Secondary | ICD-10-CM

## 2019-09-06 LAB — COMPREHENSIVE METABOLIC PANEL
ALT: 11 U/L (ref 0–44)
AST: 11 U/L — ABNORMAL LOW (ref 15–41)
Albumin: 3.7 g/dL (ref 3.5–5.0)
Alkaline Phosphatase: 134 U/L — ABNORMAL HIGH (ref 38–126)
Anion gap: 14 (ref 5–15)
BUN: 68 mg/dL — ABNORMAL HIGH (ref 6–20)
CO2: 22 mmol/L (ref 22–32)
Calcium: 8.3 mg/dL — ABNORMAL LOW (ref 8.9–10.3)
Chloride: 93 mmol/L — ABNORMAL LOW (ref 98–111)
Creatinine, Ser: 15.69 mg/dL — ABNORMAL HIGH (ref 0.44–1.00)
GFR calc Af Amer: 3 mL/min — ABNORMAL LOW (ref >60)
GFR calc non Af Amer: 3 mL/min — ABNORMAL LOW (ref >60)
Glucose, Bld: 532 mg/dL (ref 70–99)
Potassium: 4.7 mmol/L (ref 3.5–5.1)
Sodium: 129 mmol/L — ABNORMAL LOW (ref 135–145)
Total Bilirubin: 0.6 mg/dL (ref 0.3–1.2)
Total Protein: 6.9 g/dL (ref 6.5–8.1)

## 2019-09-06 LAB — CBC WITH DIFFERENTIAL/PLATELET
Abs Immature Granulocytes: 0.02 10*3/uL (ref 0.00–0.07)
Basophils Absolute: 0 10*3/uL (ref 0.0–0.1)
Basophils Relative: 1 %
Eosinophils Absolute: 0.2 10*3/uL (ref 0.0–0.5)
Eosinophils Relative: 4 %
HCT: 26.4 % — ABNORMAL LOW (ref 36.0–46.0)
Hemoglobin: 9 g/dL — ABNORMAL LOW (ref 12.0–15.0)
Immature Granulocytes: 0 %
Lymphocytes Relative: 21 %
Lymphs Abs: 1 10*3/uL (ref 0.7–4.0)
MCH: 27.9 pg (ref 26.0–34.0)
MCHC: 34.1 g/dL (ref 30.0–36.0)
MCV: 81.7 fL (ref 80.0–100.0)
Monocytes Absolute: 0.4 10*3/uL (ref 0.1–1.0)
Monocytes Relative: 8 %
Neutro Abs: 3.2 10*3/uL (ref 1.7–7.7)
Neutrophils Relative %: 66 %
Platelets: 315 10*3/uL (ref 150–400)
RBC: 3.23 MIL/uL — ABNORMAL LOW (ref 3.87–5.11)
RDW: 13.2 % (ref 11.5–15.5)
WBC: 4.8 10*3/uL (ref 4.0–10.5)
nRBC: 0 % (ref 0.0–0.2)

## 2019-09-06 LAB — LACTATE DEHYDROGENASE: LDH: 162 U/L (ref 98–192)

## 2019-09-06 LAB — IRON AND TIBC
Iron: 133 ug/dL (ref 28–170)
Saturation Ratios: 63 % — ABNORMAL HIGH (ref 10.4–31.8)
TIBC: 211 ug/dL — ABNORMAL LOW (ref 250–450)
UIBC: 78 ug/dL

## 2019-09-06 LAB — FERRITIN: Ferritin: 799 ng/mL — ABNORMAL HIGH (ref 11–307)

## 2019-09-06 MED ORDER — SODIUM CHLORIDE 0.9% FLUSH
10.0000 mL | INTRAVENOUS | Status: AC | PRN
  Filled 2019-09-06: qty 10

## 2019-09-06 MED ORDER — SODIUM CHLORIDE 0.9 % IV SOLN
INTRAVENOUS | Status: DC
  Filled 2019-09-06: qty 250

## 2019-09-06 MED ORDER — SODIUM CHLORIDE 0.9 % IV SOLN
3300.0000 mg | Freq: Once | INTRAVENOUS | Status: AC
  Administered 2019-09-06: 3300 mg via INTRAVENOUS
  Filled 2019-09-06: qty 33

## 2019-09-06 NOTE — Assessment & Plan Note (Addendum)
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  Stable.  Hb -8.8 [see below]  Platelets/LDH are normal.  Continue Ultimoris infusions every 8 weeks; continue indefinitely at this time.  Stable  # Anemia- multifactorial- 9.0 [do not suspect secondary to atypical HUS]; likely secondary to ESRD  on procrit/Iron  prn per nephrology.  Stable; normal LDH haptoglobin..  # ESRD- on PD-stable [awaiting transplant list].   #Hypertension-157/101-stable.  Recommend compliance with medications.  # Poorly controlled DM- 532; on insulin pump; pt took her insulin 6 regular in the clinic-recommend follow-up with endocrinology.  # DISPOSITION: # Treatment today # follow up in 8 weeks- MD- labs-cbc.cmp/ldh;Iron studies/ferritin/haptoglobin Ultomiris-Dr.B

## 2019-09-06 NOTE — Progress Notes (Signed)
[place] NOTE  Patient Care Team: Rubie Maid, MD as PCP - General (Obstetrics and Gynecology) [name], FNP as Consulting Physician (Family Medicine) Azzie Glatter, MD as Consulting Physician (Internal Medicine) Sherryll Burger, MD as Consulting Physician (Nephrology)  CHIEF COMPLAINTS/PURPOSE OF CONSULTATION: Atypical HUS  # ATYPICAL HEMOLYTIC UREMIC SYNDROME [April 2019-Dx; [facility]; Dr.Arepally;s/p Plex x1; April 26th- Ecluzimab 1200 mg q 2W; march 25th 2020- Ultomiris q  8 weeks.   # AKI/CKD [name] 2018;FEB 2020- hemodialysis [[facility] in [place]; Tuesday Thursday Saturday]; May 2020-peritoneal dialysis  # IDDM [since age of 36- Dr.Kerr,/Endo]  # WORK UP for HUS [[facility]] Phospholipase A2 Receptor AB/ S Phospholipase A2 Receptor IFA-NEGATIVE   Oncology History   No history exists.   HISTORY OF PRESENTING ILLNESS:  Kelly Huff 34 y.o.  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  Patient continues to get peritoneal dialysis without any complications.  Denies any headaches.  Denies any nausea vomiting.  No diarrhea.   Review of Systems  Constitutional: Positive for malaise/fatigue. Negative for chills, diaphoresis, fever and weight loss.  HENT: Negative for nosebleeds and sore throat.   Eyes: Negative for double vision.  Respiratory: Negative for cough, hemoptysis, sputum production, shortness of breath and wheezing.   Cardiovascular: Negative for chest pain, palpitations, orthopnea and leg swelling.  Gastrointestinal: Negative for abdominal pain, blood in stool, constipation, diarrhea, heartburn, melena, nausea and vomiting.  Genitourinary: Negative for dysuria, frequency and urgency.  Musculoskeletal: Negative for back pain and joint pain.  Skin: Negative.  Negative for itching and rash.  Neurological: Negative for dizziness,  tingling, focal weakness, weakness and headaches.  Endo/Heme/Allergies: Does not bruise/bleed easily.  Psychiatric/Behavioral: Negative for depression. The patient is not nervous/anxious and does not have insomnia.     MEDICAL HISTORY:  Past Medical History:  Diagnosis Date  . Chronic kidney disease   . Diabetes mellitus without complication ([place])   . History of pre-eclampsia 2016   mild  . Hypertension     SURGICAL HISTORY: Past Surgical History:  Procedure Laterality Date  . CESAREAN SECTION    . DILATION AND CURETTAGE OF UTERUS     x 4  . IR FLUORO GUIDE CV LINE RIGHT  12/05/2018  . IR FLUORO GUIDE CV LINE RIGHT  12/13/2018  . IR REMOVAL TUN CV CATH W/O FL  02/22/2019  . IR US GUIDE VASC ACCESS RIGHT  12/05/2018  . IR US GUIDE VASC ACCESS RIGHT  12/13/2018    SOCIAL HISTORY: Social History   Socioeconomic History  . Marital status: Married    Spouse name: Not on file  . Number of children: Not on file  . Years of education: Not on file  . Highest education level: Not on file  Occupational History  . Not on file  Tobacco Use  . Smoking status: Never Smoker  . Smokeless tobacco: Never Used  Substance and Sexual Activity  . Alcohol use: No  . Drug use: No  . Sexual activity: Not Currently    Birth control/protection: None, Surgical    Comment: tubial  Other Topics Concern  . Not on file  Social History Narrative  . Not on file   Social Determinants of Health   Financial Resource Strain:   . Difficulty of Paying Living Expenses:   Food Insecurity:   . Worried About Charity fundraiser in the Last Year:   .  Ran Out of Food in the Last Year:   Transportation Needs:   . Film/video editor (Medical):   Marland Kitchen Lack of Transportation (Non-Medical):   Physical Activity:   . Days of Exercise per Week:   . Minutes of Exercise per Session:   Stress:   . Feeling of Stress :   Social Connections:   . Frequency of Communication with Friends and Family:   . Frequency  of Social Gatherings with Friends and Family:   . Attends Religious Services:   . Active Member of Clubs or Organizations:   . Attends Archivist Meetings:   Marland Kitchen Marital Status:   Intimate Partner Violence:   . Fear of Current or Ex-Partner:   . Emotionally Abused:   Marland Kitchen Physically Abused:   . Sexually Abused:     FAMILY HISTORY: Family History  Problem Relation Age of Onset  . Hypertension Mother   . Cancer Mother        Uterine vs cervical (pt unsure),   . Schizophrenia Brother   . Breast cancer Neg Hx     ALLERGIES:  is allergic to morphine; sulfamethoxazole-trimethoprim; and trulicity [dulaglutide].  MEDICATIONS:  Current Outpatient Medications  Medication Sig Dispense Refill  . amLODipine (NORVASC) 10 MG tablet Take 10 mg by mouth every evening.     . B Complex-C-Folic Acid (RENO CAPS) 1 MG CAPS Take 1 mg by mouth daily.    Marland Kitchen buPROPion (WELLBUTRIN XL) 150 MG 24 hr tablet TAKE 1 TABLET BY MOUTH EVERY DAY 90 tablet 2  . calcitRIOL (ROCALTROL) 0.25 MCG capsule Take 0.25 mcg by mouth daily.    . calcium acetate (PHOSLO) 667 MG tablet Take 1,334 mg by mouth 3 (three) times daily with meals.    . cloNIDine (CATAPRES - DOSED IN MG/24 HR) 0.1 mg/24hr patch Place 0.1 mg onto the skin See admin instructions. Apply patch on every Sunday    . cyclobenzaprine (FLEXERIL) 10 MG tablet Take 10 mg by mouth at bedtime as needed for muscle spasms.     Marland Kitchen docusate sodium (COLACE) 100 MG capsule Take 100 mg by mouth 2 (two) times daily.    Marland Kitchen gentamicin cream (GARAMYCIN) 0.1 % Apply 1 application topically See admin instructions. Apply small amount to catheter exit site once daily    . Insulin Human (INSULIN PUMP) SOLN Inject 1 each into the skin continuous. Novolog. Patient stated she is not sure how much insulin is given into the pump    . lactulose (CHRONULAC) 10 GM/15ML solution Take 20 g by mouth at bedtime as needed (constipation).     . metoprolol succinate (TOPROL-XL) 100 MG 24 hr  tablet Take 100 mg by mouth 2 (two) times daily.    Marland Kitchen olmesartan (BENICAR) 40 MG tablet Take 40 mg by mouth every morning.     No current facility-administered medications for this visit.   Facility-Administered Medications Ordered in Other Visits  Medication Dose Route Frequency Provider Last Rate Last Admin  . sodium chloride flush (NS) 0.9 % injection 10 mL  10 mL Intravenous PRN Cammie Sickle, MD       PHYSICAL EXAMINATION: ECOG PERFORMANCE STATUS: 1 - Symptomatic but completely ambulatory  Vitals:   09/06/19 0938 09/06/19 0940  BP: (!) 157/101 (!) 162/103  Pulse: 83 78  Temp: (!) 97 F (36.1 C)    Filed Weights   09/06/19 0938  Weight: 186 lb 8 oz (84.6 kg)    Physical Exam  Constitutional: She is oriented  to person, place, and time and well-developed, well-nourished, and in no distress.  Patient is alone.  HENT:  Head: Normocephalic and atraumatic.  Mouth/Throat: Oropharynx is clear and moist. No oropharyngeal exudate.  Eyes: Pupils are equal, round, and reactive to light.  Cardiovascular: Normal rate and regular rhythm.  Pulmonary/Chest: No respiratory distress. She has no wheezes.  Abdominal: Soft. Bowel sounds are normal. She exhibits no distension and no mass. There is no abdominal tenderness. There is no rebound and no guarding.  Musculoskeletal:        General: No tenderness or edema. Normal range of motion.     Cervical back: Normal range of motion and neck supple.  Neurological: She is alert and oriented to person, place, and time.  Skin: Skin is warm.  Psychiatric: Affect normal.  ;  LABORATORY DATA:  I have reviewed the data as listed Lab Results  Component Value Date   WBC 4.8 09/06/2019   HGB 9.0 (L) 09/06/2019   HCT 26.4 (L) 09/06/2019   MCV 81.7 09/06/2019   PLT 315 09/06/2019   Recent Labs    05/09/19 0923 07/12/19 0916 09/06/19 0918  NA 137 136 129*  K 3.6 4.5 4.7  CL 97* 99 93*  CO2 26 23 22   GLUCOSE 69* 220* 532*  BUN 52*  59* 68*  CREATININE 13.46* 15.12* 15.69*  CALCIUM 8.7* 8.0* 8.3*  GFRNONAA 3* 3* 3*  GFRAA 4* 3* 3*  PROT 7.3 6.5 6.9  ALBUMIN 3.7 3.6 3.7  AST 12* 10* 11*  ALT 13 11 11   ALKPHOS 111 100 134*  BILITOT 0.6 0.4 0.6    RADIOGRAPHIC STUDIES: I have personally reviewed the radiological images as listed and agreed with the findings in the report. No results found.  ASSESSMENT & PLAN:   HUS (hemolytic uremic syndrome), atypical ([place]) # Atypical hemolytic uremic syndrome -on Ultimoris infusions.  Stable.  Hb -8.8 [see below]  Platelets/LDH are normal.  Continue Ultimoris infusions every 8 weeks; continue indefinitely at this time.  Stable  # Anemia- multifactorial- 9.0 [do not suspect secondary to atypical HUS]; likely secondary to ESRD  on procrit/Iron  prn per nephrology.  Stable; normal LDH haptoglobin..  # ESRD- on PD-stable [awaiting transplant list].   #Hypertension-157/101-stable.  Recommend compliance with medications.  # Poorly controlled DM- 532; on insulin pump; pt took her insulin 6 regular in the clinic-recommend follow-up with endocrinology.  # DISPOSITION: # Treatment today # follow up in 8 weeks- MD- labs-cbc.cmp/ldh;Iron studies/ferritin/haptoglobin Ultomiris-Dr.B  All questions were answered. The patient knows to call the clinic with any problems, questions or concerns.    Cammie Sickle, MD 09/10/2019 5:08 PM

## 2019-09-07 LAB — HAPTOGLOBIN: Haptoglobin: 155 mg/dL (ref 33–278)

## 2019-10-02 ENCOUNTER — Telehealth: Payer: Self-pay | Admitting: Internal Medicine

## 2019-10-02 ENCOUNTER — Telehealth: Payer: Self-pay | Admitting: *Deleted

## 2019-10-02 NOTE — Telephone Encounter (Signed)
Patient called requesting a letter from Dr Rogue Bussing for her school [employer]) who is requiring her to get the COVID Vaccine. She does not want to get the vaccine not knowing what side effects she would have with her illness. Please call patient when letter is ready [phone]

## 2019-10-02 NOTE — Telephone Encounter (Signed)
My chart message sent.  ------------------------------------------------------------------------------------  Hi Ms.Speranza: I tried to reach you earlier in the evening.  I agree with your observations that unfortunately there is not extensive data about about safety of Covid vaccines with your disease diagnosis.  However, the experts in the field of atypical HUS-still recommend Covid vaccine.   I would strongly recommend that you reach out to you at [facility] hematologist for her expert recommendations. I cannot write a letter recommending against vaccination given the diagnosis. From the perspective of the letter, I can give you a letter stating your diagnosis.   Please feel free to reach out to me in any questions or concerns. Thank you, Dr.B

## 2019-10-02 NOTE — Telephone Encounter (Signed)
FYI- called the given number to discuss with pt; wrong number. I tried to call pt's number on file-"not connecting".  I will send my chart message.  Thanks GB

## 2019-10-03 NOTE — Telephone Encounter (Signed)
Kelly Huff - pls clarify the phone #

## 2019-10-03 NOTE — Telephone Encounter (Signed)
Dr. Jacinto Reap - see correct phone #

## 2019-10-03 NOTE — Telephone Encounter (Signed)
Sorry [phone]

## 2019-10-09 NOTE — Telephone Encounter (Signed)
Patient called again today asking if the letter is ready. She said someone called her last week wanting to know what to put in the letter, but she has not been told it is ready to pick up or not. I do not see it in her chart either

## 2019-10-10 NOTE — Telephone Encounter (Signed)
Dr. Jacinto Reap- did you speak to the patient.?

## 2019-10-10 NOTE — Telephone Encounter (Signed)
Kelly Conn- I sent the following message on June 7th. Please inform the pt of my recommendations.  ------------------------------------------------------------------------------------------ Hi [name]: I tried to reach you earlier in the evening.   I agree with your observations that unfortunately there is not extensive data about about safety of Covid vaccines with your disease diagnosis.  However, the experts in the field of atypical HUS-still recommend Covid vaccine.    I would strongly recommend that you reach out to you at [facility] hematologist for her expert recommendations. I cannot write a letter recommending against vaccination given the diagnosis. From the perspective of the letter, I can give you a letter stating your diagnosis.    Please feel free to reach out to me in any questions or concerns. Thank you, Dr.B -------------------------------------------------------------------------------------------------------

## 2019-10-10 NOTE — Telephone Encounter (Signed)
Spoke with patient. Patient aware of Dr. Aletha Halim recommendation. She will contact the hematologist at [place] to discuss her covid-19 vaccination letter. She has not yet seen Dr. Sharmaine Base mychart msg.

## 2019-10-11 ENCOUNTER — Encounter: Payer: Medicare Other | Admitting: Obstetrics and Gynecology

## 2019-11-01 ENCOUNTER — Inpatient Hospital Stay: Payer: Medicare Other

## 2019-11-01 ENCOUNTER — Telehealth: Payer: Self-pay | Admitting: Internal Medicine

## 2019-11-01 ENCOUNTER — Inpatient Hospital Stay: Payer: Medicare Other | Admitting: Internal Medicine

## 2019-11-01 NOTE — Assessment & Plan Note (Deleted)
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  Stable.  Hb -8.8 [see below]  Platelets/LDH are normal.  Continue Ultimoris infusions every 8 weeks; continue indefinitely at this time.  Stable  # Anemia- multifactorial- 9.0 [do not suspect secondary to atypical HUS]; likely secondary to ESRD  on procrit/Iron  prn per nephrology.  Stable; normal LDH haptoglobin..  # ESRD- on PD-stable [awaiting transplant list].   #Hypertension-157/101-stable.  Recommend compliance with medications.  # Poorly controlled DM- 532; on insulin pump; pt took her insulin 6 regular in the clinic-recommend follow-up with endocrinology.  # DISPOSITION: # Treatment today # follow up in 8 weeks- MD- labs-cbc.cmp/ldh;Iron studies/ferritin/haptoglobin Ultomiris-Dr.B

## 2019-11-01 NOTE — Progress Notes (Deleted)
[place] NOTE  Patient Care Team: Rubie Maid, MD as PCP - General (Obstetrics and Gynecology) [name], FNP as Consulting Physician (Family Medicine) Azzie Glatter, MD as Consulting Physician (Internal Medicine) Sherryll Burger, MD as Consulting Physician (Nephrology)  CHIEF COMPLAINTS/PURPOSE OF CONSULTATION: Atypical HUS  # ATYPICAL HEMOLYTIC UREMIC SYNDROME [April 2019-Dx; [facility]; Dr.Arepally;s/p Plex x1; April 26th- Ecluzimab 1200 mg q 2W; march 25th 2020- Ultomiris q  8 weeks.   # AKI/CKD [name] 2018;FEB 2020- hemodialysis [[facility] in [place]; Tuesday Thursday Saturday]; May 2020-peritoneal dialysis  # IDDM [since age of 70- Dr.Kerr,/Endo]  # WORK UP for HUS [[facility]] Phospholipase A2 Receptor AB/ S Phospholipase A2 Receptor IFA-NEGATIVE   Oncology History   No history exists.   HISTORY OF PRESENTING ILLNESS:  Kelly Huff 34 y.o.  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  Patient continues to get peritoneal dialysis without any complications.  Denies any headaches.  Denies any nausea vomiting.  No diarrhea.   Review of Systems  Constitutional: Positive for malaise/fatigue. Negative for chills, diaphoresis, fever and weight loss.  HENT: Negative for nosebleeds and sore throat.   Eyes: Negative for double vision.  Respiratory: Negative for cough, hemoptysis, sputum production, shortness of breath and wheezing.   Cardiovascular: Negative for chest pain, palpitations, orthopnea and leg swelling.  Gastrointestinal: Negative for abdominal pain, blood in stool, constipation, diarrhea, heartburn, melena, nausea and vomiting.  Genitourinary: Negative for dysuria, frequency and urgency.  Musculoskeletal: Negative for back pain and joint pain.  Skin: Negative.  Negative for itching and rash.  Neurological: Negative for dizziness,  tingling, focal weakness, weakness and headaches.  Endo/Heme/Allergies: Does not bruise/bleed easily.  Psychiatric/Behavioral: Negative for depression. The patient is not nervous/anxious and does not have insomnia.     MEDICAL HISTORY:  Past Medical History:  Diagnosis Date  . Chronic kidney disease   . Diabetes mellitus without complication ([place])   . History of pre-eclampsia 2016   mild  . Hypertension     SURGICAL HISTORY: Past Surgical History:  Procedure Laterality Date  . CESAREAN SECTION    . DILATION AND CURETTAGE OF UTERUS     x 4  . IR FLUORO GUIDE CV LINE RIGHT  12/05/2018  . IR FLUORO GUIDE CV LINE RIGHT  12/13/2018  . IR REMOVAL TUN CV CATH W/O FL  02/22/2019  . IR US GUIDE VASC ACCESS RIGHT  12/05/2018  . IR US GUIDE VASC ACCESS RIGHT  12/13/2018    SOCIAL HISTORY: Social History   Socioeconomic History  . Marital status: Married    Spouse name: Not on file  . Number of children: Not on file  . Years of education: Not on file  . Highest education level: Not on file  Occupational History  . Not on file  Tobacco Use  . Smoking status: Never Smoker  . Smokeless tobacco: Never Used  Vaping Use  . Vaping Use: Never used  Substance and Sexual Activity  . Alcohol use: No  . Drug use: No  . Sexual activity: Not Currently    Birth control/protection: None, Surgical    Comment: tubial  Other Topics Concern  . Not on file  Social History Narrative  . Not on file   Social Determinants of Health   Financial Resource Strain:   . Difficulty of Paying Living Expenses:   Food Insecurity:   . Worried About Estate manager/land agent  of Food in the Last Year:   . [place] in the Last Year:   Transportation Needs:   . Lack of Transportation (Medical):   Marland Kitchen Lack of Transportation (Non-Medical):   Physical Activity:   . Days of Exercise per Week:   . Minutes of Exercise per Session:   Stress:   . Feeling of Stress :   Social Connections:   . Frequency of Communication  with Friends and Family:   . Frequency of Social Gatherings with Friends and Family:   . Attends Religious Services:   . Active Member of Clubs or Organizations:   . Attends Archivist Meetings:   Marland Kitchen Marital Status:   Intimate Partner Violence:   . Fear of Current or Ex-Partner:   . Emotionally Abused:   Marland Kitchen Physically Abused:   . Sexually Abused:     FAMILY HISTORY: Family History  Problem Relation Age of Onset  . Hypertension Mother   . Cancer Mother        Uterine vs cervical (pt unsure),   . Schizophrenia Brother   . Breast cancer Neg Hx     ALLERGIES:  is allergic to morphine, sulfamethoxazole-trimethoprim, and trulicity [dulaglutide].  MEDICATIONS:  Current Outpatient Medications  Medication Sig Dispense Refill  . amLODipine (NORVASC) 10 MG tablet Take 10 mg by mouth every evening.     . B Complex-C-Folic Acid (RENO CAPS) 1 MG CAPS Take 1 mg by mouth daily.    Marland Kitchen buPROPion (WELLBUTRIN XL) 150 MG 24 hr tablet TAKE 1 TABLET BY MOUTH EVERY DAY 90 tablet 2  . calcitRIOL (ROCALTROL) 0.25 MCG capsule Take 0.25 mcg by mouth daily.    . calcium acetate (PHOSLO) 667 MG tablet Take 1,334 mg by mouth 3 (three) times daily with meals.    . cloNIDine (CATAPRES - DOSED IN MG/24 HR) 0.1 mg/24hr patch Place 0.1 mg onto the skin See admin instructions. Apply patch on every Sunday    . cyclobenzaprine (FLEXERIL) 10 MG tablet Take 10 mg by mouth at bedtime as needed for muscle spasms.     Marland Kitchen docusate sodium (COLACE) 100 MG capsule Take 100 mg by mouth 2 (two) times daily.    Marland Kitchen gentamicin cream (GARAMYCIN) 0.1 % Apply 1 application topically See admin instructions. Apply small amount to catheter exit site once daily    . Insulin Human (INSULIN PUMP) SOLN Inject 1 each into the skin continuous. Novolog. Patient stated she is not sure how much insulin is given into the pump    . lactulose (CHRONULAC) 10 GM/15ML solution Take 20 g by mouth at bedtime as needed (constipation).     .  metoprolol succinate (TOPROL-XL) 100 MG 24 hr tablet Take 100 mg by mouth 2 (two) times daily.    Marland Kitchen olmesartan (BENICAR) 40 MG tablet Take 40 mg by mouth every morning.     No current facility-administered medications for this visit.   Facility-Administered Medications Ordered in Other Visits  Medication Dose Route Frequency Provider Last Rate Last Admin  . sodium chloride flush (NS) 0.9 % injection 10 mL  10 mL Intravenous PRN Cammie Sickle, MD       PHYSICAL EXAMINATION: ECOG PERFORMANCE STATUS: 1 - Symptomatic but completely ambulatory  There were no vitals filed for this visit. There were no vitals filed for this visit.  Physical Exam Constitutional:      Comments: Patient is alone.  HENT:     Head: Normocephalic and atraumatic.  Mouth/Throat:     Pharynx: No oropharyngeal exudate.  Eyes:     Pupils: Pupils are equal, round, and reactive to light.  Cardiovascular:     Rate and Rhythm: Normal rate and regular rhythm.  Pulmonary:     Effort: No respiratory distress.     Breath sounds: No wheezing.  Abdominal:     General: Bowel sounds are normal. There is no distension.     Palpations: Abdomen is soft. There is no mass.     Tenderness: There is no abdominal tenderness. There is no guarding or rebound.  Musculoskeletal:        General: No tenderness. Normal range of motion.     Cervical back: Normal range of motion and neck supple.  Skin:    General: Skin is warm.  Neurological:     Mental Status: She is alert and oriented to person, place, and time.  Psychiatric:        Mood and Affect: Affect normal.   ;  LABORATORY DATA:  I have reviewed the data as listed Lab Results  Component Value Date   WBC 4.8 09/06/2019   HGB 9.0 (L) 09/06/2019   HCT 26.4 (L) 09/06/2019   MCV 81.7 09/06/2019   PLT 315 09/06/2019   Recent Labs    05/09/19 0923 07/12/19 0916 09/06/19 0918  NA 137 136 129*  K 3.6 4.5 4.7  CL 97* 99 93*  CO2 26 23 22   GLUCOSE 69* 220*  532*  BUN 52* 59* 68*  CREATININE 13.46* 15.12* 15.69*  CALCIUM 8.7* 8.0* 8.3*  GFRNONAA 3* 3* 3*  GFRAA 4* 3* 3*  PROT 7.3 6.5 6.9  ALBUMIN 3.7 3.6 3.7  AST 12* 10* 11*  ALT 13 11 11   ALKPHOS 111 100 134*  BILITOT 0.6 0.4 0.6    RADIOGRAPHIC STUDIES: I have personally reviewed the radiological images as listed and agreed with the findings in the report. No results found.  ASSESSMENT & PLAN:   No problem-specific Assessment & Plan notes found for this encounter.  All questions were answered. The patient knows to call the clinic with any problems, questions or concerns.    Cammie Sickle, MD 11/01/2019 8:10 AM

## 2019-11-01 NOTE — Telephone Encounter (Signed)
Colette, please contact patient to r/s her treatment for next week.

## 2019-11-01 NOTE — Telephone Encounter (Signed)
Pt called to report that she will not be coming in to her appt today. She is having pains in her abdomen and could not make. I did try to encourage the patient to at least come see the provider if she was not feeling well, but she declined. Pt would like a call back to reschedule missed treatment.

## 2019-11-02 NOTE — Telephone Encounter (Signed)
Apts have been r/s to 7/14

## 2019-11-07 ENCOUNTER — Encounter: Payer: Self-pay | Admitting: Internal Medicine

## 2019-11-07 ENCOUNTER — Other Ambulatory Visit: Payer: Self-pay | Admitting: Nephrology

## 2019-11-07 ENCOUNTER — Other Ambulatory Visit: Payer: Self-pay | Admitting: Internal Medicine

## 2019-11-07 DIAGNOSIS — R109 Unspecified abdominal pain: Secondary | ICD-10-CM

## 2019-11-07 NOTE — Progress Notes (Signed)
Patient prescreened for appointment. Patient has no concerns or questions.  

## 2019-11-08 ENCOUNTER — Inpatient Hospital Stay: Payer: Medicare Other

## 2019-11-08 ENCOUNTER — Inpatient Hospital Stay (HOSPITAL_BASED_OUTPATIENT_CLINIC_OR_DEPARTMENT_OTHER): Payer: Medicare Other | Admitting: Internal Medicine

## 2019-11-08 ENCOUNTER — Other Ambulatory Visit: Payer: Self-pay

## 2019-11-08 ENCOUNTER — Inpatient Hospital Stay: Payer: Medicare Other | Attending: Internal Medicine

## 2019-11-08 ENCOUNTER — Encounter: Payer: Self-pay | Admitting: *Deleted

## 2019-11-08 DIAGNOSIS — N186 End stage renal disease: Secondary | ICD-10-CM | POA: Insufficient documentation

## 2019-11-08 DIAGNOSIS — Z79899 Other long term (current) drug therapy: Secondary | ICD-10-CM | POA: Diagnosis not present

## 2019-11-08 DIAGNOSIS — I120 Hypertensive chronic kidney disease with stage 5 chronic kidney disease or end stage renal disease: Secondary | ICD-10-CM | POA: Insufficient documentation

## 2019-11-08 DIAGNOSIS — Z7682 Awaiting organ transplant status: Secondary | ICD-10-CM | POA: Insufficient documentation

## 2019-11-08 DIAGNOSIS — Z992 Dependence on renal dialysis: Secondary | ICD-10-CM | POA: Diagnosis not present

## 2019-11-08 DIAGNOSIS — D5939 Other hemolytic-uremic syndrome: Secondary | ICD-10-CM

## 2019-11-08 DIAGNOSIS — D593 Hemolytic-uremic syndrome: Secondary | ICD-10-CM | POA: Diagnosis present

## 2019-11-08 DIAGNOSIS — E1122 Type 2 diabetes mellitus with diabetic chronic kidney disease: Secondary | ICD-10-CM | POA: Diagnosis not present

## 2019-11-08 LAB — IRON AND TIBC
Iron: 71 ug/dL (ref 28–170)
Saturation Ratios: 33 % — ABNORMAL HIGH (ref 10.4–31.8)
TIBC: 217 ug/dL — ABNORMAL LOW (ref 250–450)
UIBC: 146 ug/dL

## 2019-11-08 LAB — COMPREHENSIVE METABOLIC PANEL
ALT: 11 U/L (ref 0–44)
AST: 13 U/L — ABNORMAL LOW (ref 15–41)
Albumin: 3.7 g/dL (ref 3.5–5.0)
Alkaline Phosphatase: 122 U/L (ref 38–126)
Anion gap: 16 — ABNORMAL HIGH (ref 5–15)
BUN: 54 mg/dL — ABNORMAL HIGH (ref 6–20)
CO2: 27 mmol/L (ref 22–32)
Calcium: 8.9 mg/dL (ref 8.9–10.3)
Chloride: 92 mmol/L — ABNORMAL LOW (ref 98–111)
Creatinine, Ser: 15.19 mg/dL — ABNORMAL HIGH (ref 0.44–1.00)
GFR calc Af Amer: 3 mL/min — ABNORMAL LOW (ref >60)
GFR calc non Af Amer: 3 mL/min — ABNORMAL LOW (ref >60)
Glucose, Bld: 310 mg/dL — ABNORMAL HIGH (ref 70–99)
Potassium: 3.9 mmol/L (ref 3.5–5.1)
Sodium: 135 mmol/L (ref 135–145)
Total Bilirubin: 0.4 mg/dL (ref 0.3–1.2)
Total Protein: 7.4 g/dL (ref 6.5–8.1)

## 2019-11-08 LAB — CBC WITH DIFFERENTIAL/PLATELET
Abs Immature Granulocytes: 0.02 10*3/uL (ref 0.00–0.07)
Basophils Absolute: 0.1 10*3/uL (ref 0.0–0.1)
Basophils Relative: 1 %
Eosinophils Absolute: 0.2 10*3/uL (ref 0.0–0.5)
Eosinophils Relative: 3 %
HCT: 33.1 % — ABNORMAL LOW (ref 36.0–46.0)
Hemoglobin: 11.1 g/dL — ABNORMAL LOW (ref 12.0–15.0)
Immature Granulocytes: 0 %
Lymphocytes Relative: 20 %
Lymphs Abs: 1.2 10*3/uL (ref 0.7–4.0)
MCH: 28.4 pg (ref 26.0–34.0)
MCHC: 33.5 g/dL (ref 30.0–36.0)
MCV: 84.7 fL (ref 80.0–100.0)
Monocytes Absolute: 0.3 10*3/uL (ref 0.1–1.0)
Monocytes Relative: 6 %
Neutro Abs: 4 10*3/uL (ref 1.7–7.7)
Neutrophils Relative %: 70 %
Platelets: 476 10*3/uL — ABNORMAL HIGH (ref 150–400)
RBC: 3.91 MIL/uL (ref 3.87–5.11)
RDW: 15.3 % (ref 11.5–15.5)
WBC: 5.8 10*3/uL (ref 4.0–10.5)
nRBC: 0 % (ref 0.0–0.2)

## 2019-11-08 LAB — LACTATE DEHYDROGENASE: LDH: 189 U/L (ref 98–192)

## 2019-11-08 LAB — FERRITIN: Ferritin: 805 ng/mL — ABNORMAL HIGH (ref 11–307)

## 2019-11-08 MED ORDER — SODIUM CHLORIDE 0.9 % IV SOLN
Freq: Once | INTRAVENOUS | Status: AC
  Filled 2019-11-08: qty 250

## 2019-11-08 MED ORDER — SODIUM CHLORIDE 0.9 % IV SOLN
3300.0000 mg | Freq: Once | INTRAVENOUS | Status: AC
  Administered 2019-11-08: 3300 mg via INTRAVENOUS
  Filled 2019-11-08: qty 33

## 2019-11-08 NOTE — Progress Notes (Signed)
[place] NOTE  Patient Care Team: Rubie Maid, MD as PCP - General (Obstetrics and Gynecology) [name], FNP as Consulting Physician (Family Medicine) Azzie Glatter, MD as Consulting Physician (Internal Medicine) Sherryll Burger, MD as Consulting Physician (Nephrology) Cammie Sickle, MD as Consulting Physician (Internal Medicine)  CHIEF COMPLAINTS/PURPOSE OF CONSULTATION: Atypical HUS  # ATYPICAL HEMOLYTIC UREMIC SYNDROME [April 2019-Dx; [facility]; Dr.Arepally;s/p Plex x1; April 26th- Ecluzimab 1200 mg q 2W; march 25th 2020- Ultomiris q  8 weeks.   # AKI/CKD [name] 2018;FEB 2020- hemodialysis [[facility] in [place]; Tuesday Thursday Saturday]; May 2020-peritoneal dialysis  # IDDM [since age of 20- Dr.Kerr,/Endo]  # WORK UP for HUS [[facility]] Phospholipase A2 Receptor AB/ S Phospholipase A2 Receptor IFA-NEGATIVE   Oncology History   No history exists.   HISTORY OF PRESENTING ILLNESS:  Kelly Huff 34 y.o.  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  Patient had episode abdominal pain-which was initially concerning for peritonitis.  However negative.  She continues to get peritoneal dialysis without any complications.  She has been getting IV iron; Procrit during dialysis.  Otherwise denies any swelling in legs.  No fevers or chills.  Patient reluctant with second dose of Covid vaccine given her previous intolerance to dose #1  Review of Systems  Constitutional: Positive for malaise/fatigue. Negative for chills, diaphoresis, fever and weight loss.  HENT: Negative for nosebleeds and sore throat.   Eyes: Negative for double vision.  Respiratory: Negative for cough, hemoptysis, sputum production, shortness of breath and wheezing.   Cardiovascular: Negative for chest pain, palpitations, orthopnea and leg swelling.  Gastrointestinal:  Negative for abdominal pain, blood in stool, constipation, diarrhea, heartburn, melena, nausea and vomiting.  Genitourinary: Negative for dysuria, frequency and urgency.  Musculoskeletal: Negative for back pain and joint pain.  Skin: Negative.  Negative for itching and rash.  Neurological: Negative for dizziness, tingling, focal weakness, weakness and headaches.  Endo/Heme/Allergies: Does not bruise/bleed easily.  Psychiatric/Behavioral: Negative for depression. The patient is not nervous/anxious and does not have insomnia.     MEDICAL HISTORY:  Past Medical History:  Diagnosis Date  . Chronic kidney disease   . Diabetes mellitus without complication ([place])   . History of pre-eclampsia 2016   mild  . Hypertension     SURGICAL HISTORY: Past Surgical History:  Procedure Laterality Date  . CESAREAN SECTION    . DILATION AND CURETTAGE OF UTERUS     x 4  . IR FLUORO GUIDE CV LINE RIGHT  12/05/2018  . IR FLUORO GUIDE CV LINE RIGHT  12/13/2018  . IR REMOVAL TUN CV CATH W/O FL  02/22/2019  . IR US GUIDE VASC ACCESS RIGHT  12/05/2018  . IR US GUIDE VASC ACCESS RIGHT  12/13/2018    SOCIAL HISTORY: Social History   Socioeconomic History  . Marital status: Married    Spouse name: Not on file  . Number of children: Not on file  . Years of education: Not on file  . Highest education level: Not on file  Occupational History  . Not on file  Tobacco Use  . Smoking status: Never Smoker  . Smokeless tobacco: Never Used  Vaping Use  . Vaping Use: Never used  Substance and Sexual Activity  . Alcohol use: No  . Drug use: No  . Sexual activity: Not Currently    Birth control/protection: None, Surgical    Comment: tubial  Other Topics Concern  . Not on file  Social History Narrative  . Not on file   Social Determinants of Health   Financial Resource Strain:   . Difficulty of Paying Living Expenses:   Food Insecurity:   . Worried About Charity fundraiser in the Last Year:   . Arts development officer in the Last Year:   Transportation Needs:   . Film/video editor (Medical):   Marland Kitchen Lack of Transportation (Non-Medical):   Physical Activity:   . Days of Exercise per Week:   . Minutes of Exercise per Session:   Stress:   . Feeling of Stress :   Social Connections:   . Frequency of Communication with Friends and Family:   . Frequency of Social Gatherings with Friends and Family:   . Attends Religious Services:   . Active Member of Clubs or Organizations:   . Attends Archivist Meetings:   Marland Kitchen Marital Status:   Intimate Partner Violence:   . Fear of Current or Ex-Partner:   . Emotionally Abused:   Marland Kitchen Physically Abused:   . Sexually Abused:     FAMILY HISTORY: Family History  Problem Relation Age of Onset  . Hypertension Mother   . Cancer Mother        Uterine vs cervical (pt unsure),   . Schizophrenia Brother   . Breast cancer Neg Hx     ALLERGIES:  is allergic to morphine, sulfamethoxazole-trimethoprim, and trulicity [dulaglutide].  MEDICATIONS:  Current Outpatient Medications  Medication Sig Dispense Refill  . amLODipine (NORVASC) 10 MG tablet Take 10 mg by mouth every evening.     . B Complex-C-Folic Acid (RENO CAPS) 1 MG CAPS Take 1 mg by mouth daily.    Marland Kitchen buPROPion (WELLBUTRIN XL) 150 MG 24 hr tablet TAKE 1 TABLET BY MOUTH EVERY DAY 90 tablet 2  . calcitRIOL (ROCALTROL) 0.25 MCG capsule Take 0.25 mcg by mouth daily.    . calcium acetate (PHOSLO) 667 MG tablet Take 1,334 mg by mouth 3 (three) times daily with meals.    . cloNIDine (CATAPRES - DOSED IN MG/24 HR) 0.1 mg/24hr patch Place 0.1 mg onto the skin See admin instructions. Apply patch on every Sunday    . cyclobenzaprine (FLEXERIL) 10 MG tablet Take 10 mg by mouth at bedtime as needed for muscle spasms.     Marland Kitchen docusate sodium (COLACE) 100 MG capsule Take 100 mg by mouth 2 (two) times daily.    Marland Kitchen gentamicin cream (GARAMYCIN) 0.1 % Apply 1 application topically See admin instructions. Apply  small amount to catheter exit site once daily    . Insulin Human (INSULIN PUMP) SOLN Inject 1 each into the skin continuous. Novolog. Patient stated she is not sure how much insulin is given into the pump    . lactulose (CHRONULAC) 10 GM/15ML solution Take 20 g by mouth at bedtime as needed (constipation).     . metoprolol succinate (TOPROL-XL) 100 MG 24 hr tablet Take 100 mg by mouth 2 (two) times daily.    Marland Kitchen olmesartan (BENICAR) 40 MG tablet Take 40 mg by mouth every morning.     No current facility-administered medications for this visit.   Facility-Administered Medications Ordered in Other Visits  Medication Dose Route Frequency Provider Last Rate Last Admin  . sodium chloride flush (NS) 0.9 % injection 10 mL  10 mL Intravenous PRN Cammie Sickle, MD       PHYSICAL EXAMINATION: ECOG PERFORMANCE STATUS: 1 -  Symptomatic but completely ambulatory  Vitals:   11/08/19 0929 11/08/19 0945  BP: (!) 152/111 (!) 154/92  Pulse: 90   Resp: 18   Temp: (!) 97.1 F (36.2 C)   SpO2: 100%    Filed Weights   11/08/19 0929  Weight: 178 lb (80.7 kg)    Physical Exam Constitutional:      Comments: Patient is alone.  HENT:     Head: Normocephalic and atraumatic.     Mouth/Throat:     Pharynx: No oropharyngeal exudate.  Eyes:     Pupils: Pupils are equal, round, and reactive to light.  Cardiovascular:     Rate and Rhythm: Normal rate and regular rhythm.  Pulmonary:     Effort: No respiratory distress.     Breath sounds: No wheezing.  Abdominal:     General: Bowel sounds are normal. There is no distension.     Palpations: Abdomen is soft. There is no mass.     Tenderness: There is no abdominal tenderness. There is no guarding or rebound.  Musculoskeletal:        General: No tenderness. Normal range of motion.     Cervical back: Normal range of motion and neck supple.  Skin:    General: Skin is warm.  Neurological:     Mental Status: She is alert and oriented to person, place,  and time.  Psychiatric:        Mood and Affect: Affect normal.   ;  LABORATORY DATA:  I have reviewed the data as listed Lab Results  Component Value Date   WBC 5.8 11/08/2019   HGB 11.1 (L) 11/08/2019   HCT 33.1 (L) 11/08/2019   MCV 84.7 11/08/2019   PLT 476 (H) 11/08/2019   Recent Labs    07/12/19 0916 09/06/19 0918 11/08/19 0857  NA 136 129* 135  K 4.5 4.7 3.9  CL 99 93* 92*  CO2 23 22 27   GLUCOSE 220* 532* 310*  BUN 59* 68* 54*  CREATININE 15.12* 15.69* 15.19*  CALCIUM 8.0* 8.3* 8.9  GFRNONAA 3* 3* 3*  GFRAA 3* 3* 3*  PROT 6.5 6.9 7.4  ALBUMIN 3.6 3.7 3.7  AST 10* 11* 13*  ALT 11 11 11   ALKPHOS 100 134* 122  BILITOT 0.4 0.6 0.4    RADIOGRAPHIC STUDIES: I have personally reviewed the radiological images as listed and agreed with the findings in the report. No results found.  ASSESSMENT & PLAN:   HUS (hemolytic uremic syndrome), atypical ([place]) # Atypical hemolytic uremic syndrome -on Ultimoris infusions.  Stable.  Hb -8.8 [see below]  Platelets/LDH are normal.  Continue Ultimoris infusions every 8 weeks; continue indefinitely at this time. STABLE.   # Anemia- multifactorial- 11. [do not suspect secondary to atypical HUS]; likely secondary to ESRD  on procrit/Iron  prn per nephrology.STABLE.  # ESRD- on PD- STABLE;  [awaiting transplant list].   # Hypertension-157/111-Overall STABLE; Recommend compliance with medications.  # Poorly controlled DM- 310; on insulin pump; STABLE.   # COVID vaccine: Intolerance to covid vaccine#1.  Letter given- patient's concerns.  # DISPOSITION: # Treatment today # follow up in 8 weeks- MD- labs-cbc.cmp'LDH;Ultomiris-Dr.B  All questions were answered. The patient knows to call the clinic with any problems, questions or concerns.    Cammie Sickle, MD 11/08/2019 9:35 PM

## 2019-11-08 NOTE — Assessment & Plan Note (Addendum)
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  Stable.  Hb -8.8 [see below]  Platelets/LDH are normal.  Continue Ultimoris infusions every 8 weeks; continue indefinitely at this time. STABLE.   # Anemia- multifactorial- 11. [do not suspect secondary to atypical HUS]; likely secondary to ESRD  on procrit/Iron  prn per nephrology.STABLE.  # ESRD- on PD- STABLE;  [awaiting transplant list].   # Hypertension-157/111-Overall STABLE; Recommend compliance with medications.  # Poorly controlled DM- 310; on insulin pump; STABLE.   # COVID vaccine: Intolerance to covid vaccine#1.  Letter given- patient's concerns.  # DISPOSITION: # Treatment today # follow up in 8 weeks- MD- labs-cbc.cmp'LDH;Ultomiris-Dr.B

## 2019-11-09 ENCOUNTER — Ambulatory Visit (INDEPENDENT_AMBULATORY_CARE_PROVIDER_SITE_OTHER): Payer: Medicare Other | Admitting: Obstetrics and Gynecology

## 2019-11-09 ENCOUNTER — Encounter: Payer: Self-pay | Admitting: Obstetrics and Gynecology

## 2019-11-09 ENCOUNTER — Other Ambulatory Visit (HOSPITAL_COMMUNITY)
Admission: RE | Admit: 2019-11-09 | Discharge: 2019-11-09 | Disposition: A | Discharge status: Home / Self Care | Payer: Medicare Other | Source: Ambulatory Visit | Attending: Obstetrics and Gynecology | Admitting: Obstetrics and Gynecology

## 2019-11-09 VITALS — BP 129/84 | HR 86 | Ht 67.0 in | Wt 179.9 lb

## 2019-11-09 DIAGNOSIS — Z1151 Encounter for screening for human papillomavirus (HPV): Secondary | ICD-10-CM | POA: Insufficient documentation

## 2019-11-09 DIAGNOSIS — Z0000 Encounter for general adult medical examination without abnormal findings: Secondary | ICD-10-CM

## 2019-11-09 DIAGNOSIS — N186 End stage renal disease: Secondary | ICD-10-CM

## 2019-11-09 DIAGNOSIS — Z01419 Encounter for gynecological examination (general) (routine) without abnormal findings: Secondary | ICD-10-CM | POA: Diagnosis present

## 2019-11-09 DIAGNOSIS — N179 Acute kidney failure, unspecified: Secondary | ICD-10-CM | POA: Diagnosis not present

## 2019-11-09 DIAGNOSIS — Z8481 Family history of carrier of genetic disease: Secondary | ICD-10-CM

## 2019-11-09 DIAGNOSIS — E103219 Type 1 diabetes mellitus with mild nonproliferative diabetic retinopathy with macular edema, unspecified eye: Secondary | ICD-10-CM

## 2019-11-09 DIAGNOSIS — N1831 Chronic kidney disease, stage 3a: Secondary | ICD-10-CM

## 2019-11-09 DIAGNOSIS — I10 Essential (primary) hypertension: Secondary | ICD-10-CM

## 2019-11-09 DIAGNOSIS — R7989 Other specified abnormal findings of blood chemistry: Secondary | ICD-10-CM

## 2019-11-09 LAB — HAPTOGLOBIN: Haptoglobin: 171 mg/dL (ref 33–278)

## 2019-11-09 NOTE — Progress Notes (Signed)
GYNECOLOGY ANNUAL PHYSICAL EXAM PROGRESS NOTE  Subjective:    Kelly Huff is a 34 y.o. [phone] female who presents for an annual exam.  She has a PMH of HTN, Type 1 DM, ESRD on dialysis. The patient is not currently sexually active. The patient wears seatbelts: yes. The patient participates in regular exercise: no. Has the patient ever been transfused or tattooed?: yes, tattoos. The patient reports that there is not domestic violence in her life.   Emali notes stressors (some strife with her eldest son and his paternal grandmother, since FOB passed away last year).   She also still notes that she is currently on kidney transplant list (plans to go to [facility] soon to be placed on their transplant list as well).    Gynecologic History Contraception: tubal ligation  History of STI's: Denies Last Pap: 12/2016. Results were: normal.  Denies h/o abnormal pap smears. Last mammogram: 07/2019.  Results were: normal.   Menstrual History: Menarche age: 30 Patient's last menstrual period was 10/10/2019. Period Duration (Days): 5-7 Period Pattern: Regular Menstrual Flow: Heavy Menstrual Control: Maxi pad Menstrual Control Change Freq (Hours): 1-2 Dysmenorrhea: (!) Mild Dysmenorrhea Symptoms: Cramping  OB History  Gravida Para Term Preterm AB Living  _0 SAB TAB Ectopic Multiple Live Births  0 0 0 0 3    # Outcome Date GA Lbr Len/2nd Weight Sex Delivery Anes PTL Lv  5 Preterm 11/09/17 [redacted]w[redacted]d 2 lb 0.1 oz (0.91 kg) M CS-LTranv Spinal N LIV     Complications: Chronic hypertension with superimposed pre-eclampsia, DM [place]di[place]     Apgar1: 2  Apgar5: 3  4 Term 06/2014 320w0d5 lb 13 oz (2.637 kg) M Vag-Spont   LIV     Complications: Mild pre-eclampsia  3 AB 2008          2 AB 2006          1 Term 2004 3768w0d lb 5 oz (2.41 kg) M Vag-Spont   LIV    Past Medical History:  Diagnosis Date  . Chronic kidney disease   . Diabetes mellitus without  complication [place] [employer] [place] History of pre-eclampsia 2016   mild  . Hypertension     Past Surgical History:  Procedure Laterality Date  . CESAREAN SECTION    . DILATION AND CURETTAGE OF UTERUS     x 4  . IR FLUORO GUIDE CV LINE RIGHT  12/05/2018  . IR FLUORO GUIDE CV LINE RIGHT  12/13/2018  . IR REMOVAL TUN CV CATH W/O FL  02/22/2019  . IR US [place] VASC ACCESS RIGHT  12/05/2018  . IR US [place] VASC ACCESS RIGHT  12/13/2018    Family History  Problem Relation Age of Onset  . Hypertension Mother   . Cancer Mother        Uterine vs cervical (pt unsure),   . Schizophrenia Brother   . Breast cancer Neg Hx     Social History   Socioeconomic History  . Marital status: Married    Spouse name: Not on file  . Number of children: Not on file  . Years of education: Not on file  . Highest education level: Not on file  Occupational History  . Not on file  Tobacco Use  . Smoking status: Never Smoker  . Smokeless tobacco: Never Used  Vaping Use  . Vaping Use: Never used  Substance and Sexual  Activity  . Alcohol use: No  . Drug use: No  . Sexual activity: Yes    Birth control/protection: None, Surgical    Comment: tubial  Other Topics Concern  . Not on file  Social History Narrative  . Not on file   Social Determinants of Health   Financial Resource Strain:   . Difficulty of Paying Living Expenses:   Food Insecurity:   . Worried About Charity fundraiser in the Last Year:   . Arboriculturist in the Last Year:   Transportation Needs:   . Film/video editor (Medical):   Marland Kitchen Lack of Transportation (Non-Medical):   Physical Activity:   . Days of Exercise per Week:   . Minutes of Exercise per Session:   Stress:   . Feeling of Stress :   Social Connections:   . Frequency of Communication with Friends and Family:   . Frequency of Social Gatherings with Friends and Family:   . Attends Religious Services:   . Active Member of Clubs or Organizations:   . Attends Theatre manager Meetings:   Marland Kitchen Marital Status:   Intimate Partner Violence:   . Fear of Current or Ex-Partner:   . Emotionally Abused:   Marland Kitchen Physically Abused:   . Sexually Abused:     Current Outpatient Medications on File Prior to Visit  Medication Sig Dispense Refill  . amLODipine (NORVASC) 10 MG tablet Take 10 mg by mouth every evening.     . B Complex-C-Folic Acid (RENO CAPS) 1 MG CAPS Take 1 mg by mouth daily.    Marland Kitchen buPROPion (WELLBUTRIN XL) 150 MG 24 hr tablet TAKE 1 TABLET BY MOUTH EVERY DAY 90 tablet 2  . calcitRIOL (ROCALTROL) 0.25 MCG capsule Take 0.25 mcg by mouth daily.    . calcium acetate (PHOSLO) 667 MG tablet Take 1,334 mg by mouth 3 (three) times daily with meals.    . cloNIDine (CATAPRES - DOSED IN MG/24 HR) 0.1 mg/24hr patch Place 0.1 mg onto the skin See admin instructions. Apply patch on every Sunday    . cyclobenzaprine (FLEXERIL) 10 MG tablet Take 10 mg by mouth at bedtime as needed for muscle spasms.     Marland Kitchen docusate sodium (COLACE) 100 MG capsule Take 100 mg by mouth 2 (two) times daily.    Marland Kitchen gentamicin cream (GARAMYCIN) 0.1 % Apply 1 application topically See admin instructions. Apply small amount to catheter exit site once daily    . Insulin Human (INSULIN PUMP) SOLN Inject 1 each into the skin continuous. Novolog. Patient stated she is not sure how much insulin is given into the pump    . lactulose (CHRONULAC) 10 GM/15ML solution Take 20 g by mouth at bedtime as needed (constipation).     . metoprolol succinate (TOPROL-XL) 100 MG 24 hr tablet Take 100 mg by mouth 2 (two) times daily.    Marland Kitchen olmesartan (BENICAR) 40 MG tablet Take 40 mg by mouth every morning.     Current Facility-Administered Medications on File Prior to Visit  Medication Dose Route Frequency Provider Last Rate Last Admin  . sodium chloride flush (NS) 0.9 % injection 10 mL  10 mL Intravenous PRN Cammie Sickle, MD        Allergies  Allergen Reactions  . Morphine Itching and Other (See Comments)     Hallucination  . Sulfamethoxazole-Trimethoprim Hives  . Trulicity [Dulaglutide] Other (See Comments)    She is DM Type 1. Was prescribed but damaged  her kidneys    Review of Systems Constitutional: negative for chills, fatigue, fevers and sweats Eyes: negative for irritation, redness and visual disturbance Ears, nose, mouth, throat, and face: negative for hearing loss, nasal congestion, snoring and tinnitus Respiratory: negative for asthma, cough, sputum Cardiovascular: negative for chest pain, dyspnea, exertional chest pressure/discomfort, irregular heart beat, palpitations and syncope Gastrointestinal: negative for abdominal pain, change in bowel habits, nausea and vomiting Genitourinary: Negative for abnormal menstrual cycles, genital lesions, sexual problems and vaginal discharge, dysuria and urinary incontinence Integument/breast: negative for breast lump, breast tenderness and nipple discharge Hematologic/lymphatic: negative for bleeding and easy bruising Musculoskeletal:negative for back pain and muscle weakness Neurological: negative for dizziness, headaches, vertigo and weakness Endocrine: negative for diabetic symptoms including polydipsia, polyuria and skin dryness Allergic/Immunologic: negative for hay fever and urticaria     Objective:  Blood pressure 129/84, pulse 86, height _0  (1.702 m), weight 179 lb 14.4 oz (81.6 kg), last menstrual period 10/10/2019, unknown if currently breastfeeding. Body mass index is 28.18 kg/m.  General Appearance:    Alert, cooperative, no distress, appears stated age, overweight  Head:    Normocephalic, without obvious abnormality, atraumatic  Eyes:    PERRL, conjunctiva/corneas clear, EOM's intact, both eyes  Ears:    Normal external ear canals, both ears  Nose:   Nares normal, septum midline, mucosa normal, no drainage or sinus tenderness  Throat:   Lips, mucosa, and tongue normal; teeth and gums normal  Neck:   Supple, symmetrical,  trachea midline, no adenopathy; thyroid: no enlargement/tenderness/nodules; no carotid bruit or JVD  Back:     Symmetric, no curvature, ROM normal, no CVA tenderness  Lungs:     Clear to auscultation bilaterally, respirations unlabored  Chest Wall:    No tenderness or deformity   Heart:    Regular rate and rhythm, S1 and S2 normal, no murmur, rub or gallop  Breast Exam:    No tenderness, masses, or nipple abnormality  Abdomen:     Soft, non-tender, bowel sounds active all four quadrants, no masses, no organomegaly.  Dialysis catheter in place in RLQ, appears clean and dry.   Genitalia:    Pelvic:external genitalia normal, vagina without lesions, discharge, or tenderness, rectovaginal septum  normal. Cervix normal in appearance, no cervical motion tenderness, no adnexal masses or tenderness.  Uterus normal size, shape, mobile, regular contours, nontender.  Rectal:    Normal external sphincter.  No hemorrhoids appreciated. Internal exam not done.   Extremities:   Extremities normal, atraumatic, no cyanosis or edema  Pulses:   2+ and symmetric all extremities  Skin:   Texture, turgor normal, no rashes or lesions. Hyperpigmentation of skin.   Lymph nodes:   Cervical, supraclavicular, and axillary nodes normal  Neurologic:   CNII-XII intact, normal strength, sensation and reflexes throughout   .  Labs:  Lab Results  Component Value Date   WBC 5.8 11/08/2019   HGB 11.1 (L) 11/08/2019   HCT 33.1 (L) 11/08/2019   MCV 84.7 11/08/2019   PLT 476 (H) 11/08/2019    Lab Results  Component Value Date   CREATININE 15.19 (H) 11/08/2019   BUN 54 (H) 11/08/2019   NA 135 11/08/2019   K 3.9 11/08/2019   CL 92 (L) 11/08/2019   CO2 27 11/08/2019    Lab Results  Component Value Date   ALT 11 11/08/2019   AST 13 (L) 11/08/2019   ALKPHOS 122 11/08/2019   BILITOT 0.4 11/08/2019    Lab Results  Component Value Date   TSH 0.044 (L) 08/05/2017    Lab Results  Component Value Date   HGBA1C 8.7  (H) 12/03/2018     Assessment:   Well woman gynecologic exam Type 1 DM HTN Family h/o cancer with positive BRCA testing ESRD, on dialysis   Plan:    - Blood tests: Recent Hgb A1c, CBC, and CMP from June 2021 noted in [facility]. A1c elevated at 9.  Lipid Panel and TSH need to be reordered. No lab technician in office today, will return for labs. - Breast self exam technique reviewed and patient encouraged to perform self-exam monthly.  Mammogram performed this year (baseline) at [facility] due to patient's family history and necessitated for placement on kidney transplant list.  - Contraception: bilateral tubal ligation.  - Discussed healthy lifestyle modifications. - Pap smear performed today. Continue routine screening.  -  Endocrinologist for management of Type 1 DM (Dr. Jackson Latino in [place]). Encouraged that improving her health could have a positive impact.  - Patient with family history of cancer, both her mother and sister tested positive for BRCA gene (cannot remember which one). Another sister tested negative. Given information for [facility] hereditary cancer screening.  Patient to return next week for testing.  - HTN managed by internist.  - ESRD managed by nephrologist. Patient currently on dialysis. On transplant list in [place], Also plans to be seen at [facility] to be placed on their transplant list as well.  - Follow up in 1 year for annual exam    Rubie Maid, MD [facility]

## 2019-11-09 NOTE — Patient Instructions (Signed)
Preventive Care 21-36 Years Old, Female Preventive care refers to visits with your health care provider and lifestyle choices that can promote health and wellness. This includes:  A yearly physical exam. This may also be called an annual well check.  Regular dental visits and eye exams.  Immunizations.  Screening for certain conditions.  Healthy lifestyle choices, such as eating a healthy diet, getting regular exercise, not using drugs or products that contain nicotine and tobacco, and limiting alcohol use. What can I expect for my preventive care visit? Physical exam Your health care provider will check your:  Height and weight. This may be used to calculate body mass index (BMI), which tells if you are at a healthy weight.  Heart rate and blood pressure.  Skin for abnormal spots. Counseling Your health care provider may ask you questions about your:  Alcohol, tobacco, and drug use.  Emotional well-being.  Home and relationship well-being.  Sexual activity.  Eating habits.  Work and work environment.  Method of birth control.  Menstrual cycle.  Pregnancy history. What immunizations do I need?  Influenza (flu) vaccine  This is recommended every year. Tetanus, diphtheria, and pertussis (Tdap) vaccine  You may need a Td booster every 10 years. Varicella (chickenpox) vaccine  You may need this if you have not been vaccinated. Human papillomavirus (HPV) vaccine  If recommended by your health care provider, you may need three doses over 6 months. Measles, mumps, and rubella (MMR) vaccine  You may need at least one dose of MMR. You may also need a second dose. Meningococcal conjugate (MenACWY) vaccine  One dose is recommended if you are age 19-21 years and a first-year college student living in a residence hall, or if you have one of several medical conditions. You may also need additional booster doses. Pneumococcal conjugate (PCV13) vaccine  You may need  this if you have certain conditions and were not previously vaccinated. Pneumococcal polysaccharide (PPSV23) vaccine  You may need one or two doses if you smoke cigarettes or if you have certain conditions. Hepatitis A vaccine  You may need this if you have certain conditions or if you travel or work in places where you may be exposed to hepatitis A. Hepatitis B vaccine  You may need this if you have certain conditions or if you travel or work in places where you may be exposed to hepatitis B. Haemophilus influenzae type b (Hib) vaccine  You may need this if you have certain conditions. You may receive vaccines as individual doses or as more than one vaccine together in one shot (combination vaccines). Talk with your health care provider about the risks and benefits of combination vaccines. What tests do I need?  Blood tests  Lipid and cholesterol levels. These may be checked every 5 years starting at age 20.  Hepatitis C test.  Hepatitis B test. Screening  Diabetes screening. This is done by checking your blood sugar (glucose) after you have not eaten for a while (fasting).  Sexually transmitted disease (STD) testing.  BRCA-related cancer screening. This may be done if you have a family history of breast, ovarian, tubal, or peritoneal cancers.  Pelvic exam and Pap test. This may be done every 3 years starting at age 21. Starting at age 30, this may be done every 5 years if you have a Pap test in combination with an HPV test. Talk with your health care provider about your test results, treatment options, and if necessary, the need for more tests.   Follow these instructions at home: Eating and drinking   Eat a diet that includes fresh fruits and vegetables, whole grains, lean protein, and low-fat dairy.  Take vitamin and mineral supplements as recommended by your health care provider.  Do not drink alcohol if: ? Your health care provider tells you not to drink. ? You are  pregnant, may be pregnant, or are planning to become pregnant.  If you drink alcohol: ? Limit how much you have to 0-1 drink a day. ? Be aware of how much alcohol is in your drink. In the [place] one drink equals one 12 oz bottle of beer (355 mL), one 5 oz glass of wine (148 mL), or one 1 oz glass of hard liquor (44 mL). Lifestyle  Take daily care of your teeth and gums.  Stay active. Exercise for at least 30 minutes on 5 or more days each week.  Do not use any products that contain nicotine or tobacco, such as cigarettes, e-cigarettes, and chewing tobacco. If you need help quitting, ask your health care provider.  If you are sexually active, practice safe sex. Use a condom or other form of birth control (contraception) in order to prevent pregnancy and STIs (sexually transmitted infections). If you plan to become pregnant, see your health care provider for a preconception visit. What's next?  Visit your health care provider once a year for a well check visit.  Ask your health care provider how often you should have your eyes and teeth checked.  Stay up to date on all vaccines. This information is not intended to replace advice given to you by your health care provider. Make sure you discuss any questions you have with your health care provider. Document Revised: 12/23/2017 Document Reviewed: 12/23/2017 Elsevier Patient Education  2020 Elsevier Inc. Breast Self-Awareness Breast self-awareness is knowing how your breasts look and feel. Doing breast self-awareness is important. It allows you to catch a breast problem early while it is still small and can be treated. All women should do breast self-awareness, including women who have had breast implants. Tell your doctor if you notice a change in your breasts. What you need:  A mirror.  A well-lit room. How to do a breast self-exam A breast self-exam is one way to learn what is normal for your breasts and to check for changes. To do a  breast self-exam: Look for changes  1. Take off all the clothes above your waist. 2. Stand in front of a mirror in a room with good lighting. 3. Put your hands on your hips. 4. Push your hands down. 5. Look at your breasts and nipples in the mirror to see if one breast or nipple looks different from the other. Check to see if: ? The shape of one breast is different. ? The size of one breast is different. ? There are wrinkles, dips, and bumps in one breast and not the other. 6. Look at each breast for changes in the skin, such as: ? Redness. ? Scaly areas. 7. Look for changes in your nipples, such as: ? Liquid around the nipples. ? Bleeding. ? Dimpling. ? Redness. ? A change in where the nipples are. Feel for changes  1. Lie on your back on the floor. 2. Feel each breast. To do this, follow these steps: ? Pick a breast to feel. ? Put the arm closest to that breast above your head. ? Use your other arm to feel the nipple area of your breast. Feel   the area with the pads of your three middle fingers by making small circles with your fingers. For the first circle, press lightly. For the second circle, press harder. For the third circle, press even harder. ? Keep making circles with your fingers at the different pressures as you move down your breast. Stop when you feel your ribs. ? Move your fingers a little toward the center of your body. ? Start making circles with your fingers again, this time going up until you reach your collarbone. ? Keep making up-and-down circles until you reach your armpit. Remember to keep using the three pressures. ? Feel the other breast in the same way. 3. Sit or stand in the tub or shower. 4. With soapy water on your skin, feel each breast the same way you did in step 2 when you were lying on the floor. Write down what you find Writing down what you find can help you remember what to tell your doctor. Write down:  What is normal for each breast.  Any  changes you find in each breast, including: ? The kind of changes you find. ? Whether you have pain. ? Size and location of any lumps.  When you last had your menstrual period. General tips  Check your breasts every month.  If you are breastfeeding, the best time to check your breasts is after you feed your baby or after you use a breast pump.  If you get menstrual periods, the best time to check your breasts is 5-7 days after your menstrual period is over.  With time, you will become comfortable with the self-exam, and you will begin to know if there are changes in your breasts. Contact a doctor if you:  See a change in the shape or size of your breasts or nipples.  See a change in the skin of your breast or nipples, such as red or scaly skin.  Have fluid coming from your nipples that is not normal.  Find a lump or thick area that was not there before.  Have pain in your breasts.  Have any concerns about your breast health. Summary  Breast self-awareness includes looking for changes in your breasts, as well as feeling for changes within your breasts.  Breast self-awareness should be done in front of a mirror in a well-lit room.  You should check your breasts every month. If you get menstrual periods, the best time to check your breasts is 5-7 days after your menstrual period is over.  Let your doctor know of any changes you see in your breasts, including changes in size, changes on the skin, pain or tenderness, or fluid from your nipples that is not normal. This information is not intended to replace advice given to you by your health care provider. Make sure you discuss any questions you have with your health care provider. Document Revised: 11/30/2017 Document Reviewed: 11/30/2017 Elsevier Patient Education  2020 Elsevier Inc.  

## 2019-11-09 NOTE — Progress Notes (Signed)
ROB-Pt present for annual exam. Pt stated having heavy cycles with clots. Pt took the first covid vaccine and was advised by one of her provider to not take the 2nd dose.

## 2019-11-13 ENCOUNTER — Encounter: Payer: Self-pay | Admitting: Obstetrics and Gynecology

## 2019-11-14 LAB — CYTOLOGY - PAP
Comment: NEGATIVE
Diagnosis: NEGATIVE
High risk HPV: NEGATIVE

## 2019-11-16 ENCOUNTER — Inpatient Hospital Stay: Admission: RE | Admit: 2019-11-16 | Payer: Medicare Other | Source: Ambulatory Visit

## 2019-11-16 ENCOUNTER — Other Ambulatory Visit: Payer: Self-pay

## 2019-11-16 ENCOUNTER — Other Ambulatory Visit: Payer: Medicare Other

## 2019-11-16 DIAGNOSIS — N1831 Chronic kidney disease, stage 3a: Secondary | ICD-10-CM

## 2019-11-16 DIAGNOSIS — N186 End stage renal disease: Secondary | ICD-10-CM

## 2019-11-16 DIAGNOSIS — R7989 Other specified abnormal findings of blood chemistry: Secondary | ICD-10-CM

## 2019-11-16 DIAGNOSIS — N179 Acute kidney failure, unspecified: Secondary | ICD-10-CM

## 2019-11-16 DIAGNOSIS — Z01419 Encounter for gynecological examination (general) (routine) without abnormal findings: Secondary | ICD-10-CM

## 2019-11-16 DIAGNOSIS — Z8481 Family history of carrier of genetic disease: Secondary | ICD-10-CM

## 2019-11-17 LAB — THYROID PANEL WITH TSH
Free Thyroxine Index: 2.2 (ref 1.2–4.9)
T3 Uptake Ratio: 26 % (ref 24–39)
T4, Total: 8.3 ug/dL (ref 4.5–12.0)
TSH: 1.63 u[IU]/mL (ref 0.450–4.500)

## 2019-11-17 LAB — LIPID PANEL
Chol/HDL Ratio: 5.6 ratio — ABNORMAL HIGH (ref 0.0–4.4)
Cholesterol, Total: 189 mg/dL (ref 100–199)
HDL: 34 mg/dL — ABNORMAL LOW (ref >39)
LDL Chol Calc (NIH): 124 mg/dL — ABNORMAL HIGH (ref 0–99)
Triglycerides: 175 mg/dL — ABNORMAL HIGH (ref 0–149)
VLDL Cholesterol Cal: 31 mg/dL (ref 5–40)

## 2019-12-04 ENCOUNTER — Other Ambulatory Visit: Payer: Self-pay

## 2019-12-04 ENCOUNTER — Encounter (HOSPITAL_COMMUNITY): Payer: Self-pay

## 2019-12-04 ENCOUNTER — Inpatient Hospital Stay (HOSPITAL_COMMUNITY): Payer: Medicare Other | Admitting: Registered Nurse

## 2019-12-04 ENCOUNTER — Ambulatory Visit
Admission: RE | Admit: 2019-12-04 | Discharge: 2019-12-04 | Disposition: A | Discharge status: Home / Self Care | Payer: Medicare Other | Source: Ambulatory Visit | Attending: Nephrology | Admitting: Nephrology

## 2019-12-04 ENCOUNTER — Encounter (HOSPITAL_COMMUNITY)
Admission: EM | Disposition: A | Discharge status: Home / Self Care | Payer: Self-pay | Source: Home / Self Care | Attending: Family Medicine

## 2019-12-04 ENCOUNTER — Inpatient Hospital Stay (HOSPITAL_COMMUNITY)
Admission: EM | Admit: 2019-12-04 | Discharge: 2019-12-06 | DRG: 341 | Disposition: A | Discharge status: Home / Self Care | Payer: Medicare Other | Attending: Internal Medicine | Admitting: Internal Medicine

## 2019-12-04 DIAGNOSIS — Z8249 Family history of ischemic heart disease and other diseases of the circulatory system: Secondary | ICD-10-CM | POA: Diagnosis not present

## 2019-12-04 DIAGNOSIS — N2581 Secondary hyperparathyroidism of renal origin: Secondary | ICD-10-CM | POA: Diagnosis present

## 2019-12-04 DIAGNOSIS — D593 Hemolytic-uremic syndrome, unspecified: Secondary | ICD-10-CM | POA: Diagnosis present

## 2019-12-04 DIAGNOSIS — K3580 Unspecified acute appendicitis: Principal | ICD-10-CM | POA: Diagnosis present

## 2019-12-04 DIAGNOSIS — Z79899 Other long term (current) drug therapy: Secondary | ICD-10-CM

## 2019-12-04 DIAGNOSIS — E1022 Type 1 diabetes mellitus with diabetic chronic kidney disease: Secondary | ICD-10-CM | POA: Diagnosis present

## 2019-12-04 DIAGNOSIS — Z885 Allergy status to narcotic agent status: Secondary | ICD-10-CM

## 2019-12-04 DIAGNOSIS — Z794 Long term (current) use of insulin: Secondary | ICD-10-CM | POA: Diagnosis not present

## 2019-12-04 DIAGNOSIS — E8889 Other specified metabolic disorders: Secondary | ICD-10-CM | POA: Diagnosis present

## 2019-12-04 DIAGNOSIS — Z818 Family history of other mental and behavioral disorders: Secondary | ICD-10-CM | POA: Diagnosis not present

## 2019-12-04 DIAGNOSIS — Z888 Allergy status to other drugs, medicaments and biological substances status: Secondary | ICD-10-CM | POA: Diagnosis not present

## 2019-12-04 DIAGNOSIS — E103219 Type 1 diabetes mellitus with mild nonproliferative diabetic retinopathy with macular edema, unspecified eye: Secondary | ICD-10-CM | POA: Diagnosis not present

## 2019-12-04 DIAGNOSIS — Z992 Dependence on renal dialysis: Secondary | ICD-10-CM

## 2019-12-04 DIAGNOSIS — N186 End stage renal disease: Secondary | ICD-10-CM | POA: Diagnosis present

## 2019-12-04 DIAGNOSIS — E871 Hypo-osmolality and hyponatremia: Secondary | ICD-10-CM | POA: Diagnosis present

## 2019-12-04 DIAGNOSIS — I120 Hypertensive chronic kidney disease with stage 5 chronic kidney disease or end stage renal disease: Secondary | ICD-10-CM | POA: Diagnosis present

## 2019-12-04 DIAGNOSIS — R109 Unspecified abdominal pain: Secondary | ICD-10-CM

## 2019-12-04 DIAGNOSIS — Z882 Allergy status to sulfonamides status: Secondary | ICD-10-CM | POA: Diagnosis not present

## 2019-12-04 DIAGNOSIS — D599 Acquired hemolytic anemia, unspecified: Secondary | ICD-10-CM | POA: Diagnosis not present

## 2019-12-04 DIAGNOSIS — Z20822 Contact with and (suspected) exposure to covid-19: Secondary | ICD-10-CM | POA: Diagnosis present

## 2019-12-04 DIAGNOSIS — D5939 Other hemolytic-uremic syndrome: Secondary | ICD-10-CM | POA: Diagnosis present

## 2019-12-04 DIAGNOSIS — E103299 Type 1 diabetes mellitus with mild nonproliferative diabetic retinopathy without macular edema, unspecified eye: Secondary | ICD-10-CM | POA: Diagnosis present

## 2019-12-04 DIAGNOSIS — D631 Anemia in chronic kidney disease: Secondary | ICD-10-CM | POA: Diagnosis present

## 2019-12-04 HISTORY — PX: LAPAROSCOPIC APPENDECTOMY: SHX408

## 2019-12-04 LAB — GLUCOSE, CAPILLARY: Glucose-Capillary: 297 mg/dL — ABNORMAL HIGH (ref 70–99)

## 2019-12-04 LAB — CBC
HCT: 35 % — ABNORMAL LOW (ref 36.0–46.0)
Hemoglobin: 11.2 g/dL — ABNORMAL LOW (ref 12.0–15.0)
MCH: 27.7 pg (ref 26.0–34.0)
MCHC: 32 g/dL (ref 30.0–36.0)
MCV: 86.4 fL (ref 80.0–100.0)
Platelets: 312 10*3/uL (ref 150–400)
RBC: 4.05 MIL/uL (ref 3.87–5.11)
RDW: 13.8 % (ref 11.5–15.5)
WBC: 5.3 10*3/uL (ref 4.0–10.5)
nRBC: 0 % (ref 0.0–0.2)

## 2019-12-04 LAB — COMPREHENSIVE METABOLIC PANEL
ALT: 8 U/L (ref 0–44)
AST: 9 U/L — ABNORMAL LOW (ref 15–41)
Albumin: 3 g/dL — ABNORMAL LOW (ref 3.5–5.0)
Alkaline Phosphatase: 106 U/L (ref 38–126)
Anion gap: 15 (ref 5–15)
BUN: 49 mg/dL — ABNORMAL HIGH (ref 6–20)
CO2: 25 mmol/L (ref 22–32)
Calcium: 8 mg/dL — ABNORMAL LOW (ref 8.9–10.3)
Chloride: 94 mmol/L — ABNORMAL LOW (ref 98–111)
Creatinine, Ser: 16.11 mg/dL — ABNORMAL HIGH (ref 0.44–1.00)
GFR calc Af Amer: 3 mL/min — ABNORMAL LOW (ref >60)
GFR calc non Af Amer: 3 mL/min — ABNORMAL LOW (ref >60)
Glucose, Bld: 174 mg/dL — ABNORMAL HIGH (ref 70–99)
Potassium: 3.8 mmol/L (ref 3.5–5.1)
Sodium: 134 mmol/L — ABNORMAL LOW (ref 135–145)
Total Bilirubin: 0.3 mg/dL (ref 0.3–1.2)
Total Protein: 5.8 g/dL — ABNORMAL LOW (ref 6.5–8.1)

## 2019-12-04 LAB — I-STAT BETA HCG BLOOD, ED (MC, WL, AP ONLY): I-stat hCG, quantitative: <5 m[IU]/mL (ref <5)

## 2019-12-04 LAB — SARS CORONAVIRUS 2 BY RT PCR (HOSPITAL ORDER, PERFORMED IN ~~LOC~~ HOSPITAL LAB): SARS Coronavirus 2: NEGATIVE

## 2019-12-04 LAB — LIPASE, BLOOD: Lipase: 30 U/L (ref 11–51)

## 2019-12-04 SURGERY — APPENDECTOMY, LAPAROSCOPIC
Anesthesia: General | Site: Abdomen

## 2019-12-04 MED ORDER — NEOSTIGMINE METHYLSULFATE 10 MG/10ML IV SOLN
INTRAVENOUS | Status: DC | PRN
  Administered 2019-12-04: 4 mg via INTRAVENOUS

## 2019-12-04 MED ORDER — SUCCINYLCHOLINE CHLORIDE 200 MG/10ML IV SOSY
PREFILLED_SYRINGE | INTRAVENOUS | Status: AC
  Filled 2019-12-04: qty 10

## 2019-12-04 MED ORDER — LIDOCAINE 2% (20 MG/ML) 5 ML SYRINGE
INTRAMUSCULAR | Status: DC | PRN
  Administered 2019-12-04: 60 mg via INTRAVENOUS

## 2019-12-04 MED ORDER — PROMETHAZINE HCL 25 MG/ML IJ SOLN
INTRAMUSCULAR | Status: DC | PRN
  Administered 2019-12-04: 6.25 mg via INTRAVENOUS

## 2019-12-04 MED ORDER — STERILE WATER FOR IRRIGATION IR SOLN
Status: DC | PRN
  Administered 2019-12-04: 1000 mL

## 2019-12-04 MED ORDER — PHENYLEPHRINE 40 MCG/ML (10ML) SYRINGE FOR IV PUSH (FOR BLOOD PRESSURE SUPPORT)
PREFILLED_SYRINGE | INTRAVENOUS | Status: AC
  Filled 2019-12-04: qty 10

## 2019-12-04 MED ORDER — CLONIDINE HCL 0.1 MG/24HR TD PTWK: 0.1000 mg | MEDICATED_PATCH | TRANSDERMAL | Status: DC

## 2019-12-04 MED ORDER — PROPOFOL 500 MG/50ML IV EMUL
INTRAVENOUS | Status: DC | PRN
  Administered 2019-12-04: 25 ug/kg/min via INTRAVENOUS

## 2019-12-04 MED ORDER — DOCUSATE SODIUM 100 MG PO CAPS: 100.0000 mg | ORAL_CAPSULE | Freq: Every day | ORAL | Status: DC | PRN

## 2019-12-04 MED ORDER — GENTAMICIN SULFATE 0.1 % EX CREA
1.0000 "application " | TOPICAL_CREAM | Freq: Every day | CUTANEOUS | Status: DC
  Administered 2019-12-05 – 2019-12-06 (×2): 1 via TOPICAL
  Filled 2019-12-04: qty 15

## 2019-12-04 MED ORDER — ONDANSETRON HCL 4 MG/2ML IJ SOLN
INTRAMUSCULAR | Status: AC
  Filled 2019-12-04: qty 2

## 2019-12-04 MED ORDER — ACETAMINOPHEN 325 MG PO TABS: 650.0000 mg | ORAL_TABLET | ORAL | Status: DC | PRN

## 2019-12-04 MED ORDER — DEXAMETHASONE SODIUM PHOSPHATE 10 MG/ML IJ SOLN
INTRAMUSCULAR | Status: DC | PRN
  Administered 2019-12-04: 10 mg via INTRAVENOUS

## 2019-12-04 MED ORDER — HYDROMORPHONE HCL 1 MG/ML IJ SOLN
1.0000 mg | INTRAMUSCULAR | Status: DC | PRN
  Administered 2019-12-05: 1 mg via INTRAVENOUS
  Filled 2019-12-04: qty 1

## 2019-12-04 MED ORDER — DEXAMETHASONE SODIUM PHOSPHATE 10 MG/ML IJ SOLN
INTRAMUSCULAR | Status: AC
  Filled 2019-12-04: qty 1

## 2019-12-04 MED ORDER — MIDAZOLAM HCL 5 MG/5ML IJ SOLN
INTRAMUSCULAR | Status: DC | PRN
  Administered 2019-12-04: 2 mg via INTRAVENOUS

## 2019-12-04 MED ORDER — PROPOFOL 10 MG/ML IV BOLUS
INTRAVENOUS | Status: AC
  Filled 2019-12-04: qty 20

## 2019-12-04 MED ORDER — BUPIVACAINE-EPINEPHRINE 0.25% -1:200000 IJ SOLN
INTRAMUSCULAR | Status: DC | PRN
  Administered 2019-12-04: 10 mL

## 2019-12-04 MED ORDER — SODIUM CHLORIDE 0.9% FLUSH: 3.0000 mL | Freq: Once | INTRAVENOUS | Status: DC

## 2019-12-04 MED ORDER — ROCURONIUM BROMIDE 10 MG/ML (PF) SYRINGE
PREFILLED_SYRINGE | INTRAVENOUS | Status: AC
  Filled 2019-12-04: qty 10

## 2019-12-04 MED ORDER — BUPIVACAINE HCL (PF) 0.25 % IJ SOLN
INTRAMUSCULAR | Status: AC
  Filled 2019-12-04: qty 30

## 2019-12-04 MED ORDER — ONDANSETRON HCL 4 MG PO TABS: 4.0000 mg | ORAL_TABLET | Freq: Four times a day (QID) | ORAL | Status: DC | PRN

## 2019-12-04 MED ORDER — FENTANYL CITRATE (PF) 250 MCG/5ML IJ SOLN
INTRAMUSCULAR | Status: DC | PRN
  Administered 2019-12-04 (×3): 50 ug via INTRAVENOUS

## 2019-12-04 MED ORDER — ONDANSETRON HCL 4 MG/2ML IJ SOLN
INTRAMUSCULAR | Status: DC | PRN
  Administered 2019-12-04: 4 mg via INTRAVENOUS

## 2019-12-04 MED ORDER — GLYCOPYRROLATE 0.2 MG/ML IJ SOLN
INTRAMUSCULAR | Status: DC | PRN
  Administered 2019-12-04: .7 mg via INTRAVENOUS

## 2019-12-04 MED ORDER — HYDROMORPHONE HCL 1 MG/ML IJ SOLN
INTRAMUSCULAR | Status: AC
  Filled 2019-12-04: qty 1

## 2019-12-04 MED ORDER — MIDAZOLAM HCL 2 MG/2ML IJ SOLN
INTRAMUSCULAR | Status: AC
  Filled 2019-12-04: qty 2

## 2019-12-04 MED ORDER — INSULIN GLARGINE 100 UNIT/ML ~~LOC~~ SOLN
15.0000 [IU] | Freq: Every day | SUBCUTANEOUS | Status: DC
  Filled 2019-12-04: qty 0.15

## 2019-12-04 MED ORDER — OXYCODONE HCL 5 MG PO TABS: 5.0000 mg | ORAL_TABLET | Freq: Once | ORAL | Status: DC | PRN

## 2019-12-04 MED ORDER — PROMETHAZINE HCL 25 MG/ML IJ SOLN: 6.2500 mg | INTRAMUSCULAR | Status: DC | PRN

## 2019-12-04 MED ORDER — SODIUM CHLORIDE 0.9 % IV SOLN: INTRAVENOUS | Status: DC | PRN

## 2019-12-04 MED ORDER — LIDOCAINE 2% (20 MG/ML) 5 ML SYRINGE
INTRAMUSCULAR | Status: AC
  Filled 2019-12-04: qty 5

## 2019-12-04 MED ORDER — INSULIN ASPART 100 UNIT/ML ~~LOC~~ SOLN
SUBCUTANEOUS | Status: AC
  Administered 2019-12-05: 7 [IU] via SUBCUTANEOUS
  Filled 2019-12-04: qty 1

## 2019-12-04 MED ORDER — INSULIN ASPART 100 UNIT/ML ~~LOC~~ SOLN: 7.0000 [IU] | Freq: Three times a day (TID) | SUBCUTANEOUS | Status: DC

## 2019-12-04 MED ORDER — PHENYLEPHRINE 40 MCG/ML (10ML) SYRINGE FOR IV PUSH (FOR BLOOD PRESSURE SUPPORT)
PREFILLED_SYRINGE | INTRAVENOUS | Status: DC | PRN
  Administered 2019-12-04: 80 ug via INTRAVENOUS

## 2019-12-04 MED ORDER — INSULIN ASPART 100 UNIT/ML ~~LOC~~ SOLN
0.0000 [IU] | Freq: Three times a day (TID) | SUBCUTANEOUS | Status: DC
  Administered 2019-12-04: 5 [IU] via SUBCUTANEOUS
  Administered 2019-12-05: 7 [IU] via SUBCUTANEOUS

## 2019-12-04 MED ORDER — EPHEDRINE 5 MG/ML INJ
INTRAVENOUS | Status: AC
  Filled 2019-12-04: qty 10

## 2019-12-04 MED ORDER — AMLODIPINE BESYLATE 10 MG PO TABS
10.0000 mg | ORAL_TABLET | Freq: Every evening | ORAL | Status: DC
  Administered 2019-12-05: 10 mg via ORAL
  Filled 2019-12-04: qty 1

## 2019-12-04 MED ORDER — ONDANSETRON HCL 4 MG/2ML IJ SOLN: 4.0000 mg | Freq: Four times a day (QID) | INTRAMUSCULAR | Status: DC | PRN

## 2019-12-04 MED ORDER — FENTANYL CITRATE (PF) 250 MCG/5ML IJ SOLN
INTRAMUSCULAR | Status: AC
  Filled 2019-12-04: qty 5

## 2019-12-04 MED ORDER — CALCIUM ACETATE (PHOS BINDER) 667 MG PO CAPS
1334.0000 mg | ORAL_CAPSULE | Freq: Three times a day (TID) | ORAL | Status: DC
  Administered 2019-12-05 – 2019-12-06 (×5): 1334 mg via ORAL
  Filled 2019-12-04 (×6): qty 2

## 2019-12-04 MED ORDER — SODIUM CHLORIDE 0.9 % IR SOLN
Status: DC | PRN
  Administered 2019-12-04: 1000 mL

## 2019-12-04 MED ORDER — PIPERACILLIN-TAZOBACTAM 3.375 G IVPB 30 MIN
3.3750 g | Freq: Once | INTRAVENOUS | Status: AC
  Administered 2019-12-04: 3.375 g via INTRAVENOUS
  Filled 2019-12-04: qty 50

## 2019-12-04 MED ORDER — CYCLOBENZAPRINE HCL 10 MG PO TABS: 10.0000 mg | ORAL_TABLET | Freq: Every evening | ORAL | Status: DC | PRN

## 2019-12-04 MED ORDER — OXYCODONE HCL 5 MG PO TABS: 5.0000 mg | ORAL_TABLET | ORAL | Status: DC | PRN

## 2019-12-04 MED ORDER — 0.9 % SODIUM CHLORIDE (POUR BTL) OPTIME
TOPICAL | Status: DC | PRN
  Administered 2019-12-04: 1000 mL

## 2019-12-04 MED ORDER — OXYCODONE HCL 5 MG/5ML PO SOLN: 5.0000 mg | Freq: Once | ORAL | Status: DC | PRN

## 2019-12-04 MED ORDER — METOPROLOL SUCCINATE ER 100 MG PO TB24
100.0000 mg | ORAL_TABLET | Freq: Two times a day (BID) | ORAL | Status: DC
  Administered 2019-12-05 – 2019-12-06 (×4): 100 mg via ORAL
  Filled 2019-12-04 (×4): qty 1

## 2019-12-04 MED ORDER — CISATRACURIUM BESYLATE (PF) 10 MG/5ML IV SOLN
INTRAVENOUS | Status: DC | PRN
  Administered 2019-12-04: 10 mg via INTRAVENOUS

## 2019-12-04 MED ORDER — CISATRACURIUM BESYLATE 20 MG/10ML IV SOLN
INTRAVENOUS | Status: AC
  Filled 2019-12-04: qty 10

## 2019-12-04 MED ORDER — HYDROMORPHONE HCL 1 MG/ML IJ SOLN
0.2500 mg | INTRAMUSCULAR | Status: DC | PRN
  Administered 2019-12-04 (×2): 0.5 mg via INTRAVENOUS

## 2019-12-04 MED ORDER — PROPOFOL 10 MG/ML IV BOLUS
INTRAVENOUS | Status: DC | PRN
  Administered 2019-12-04: 150 mg via INTRAVENOUS

## 2019-12-04 SURGICAL SUPPLY — 36 items
BENZOIN TINCTURE PRP APPL 2/3 (GAUZE/BANDAGES/DRESSINGS) ×2 IMPLANT
CANISTER SUCT 3000ML PPV (MISCELLANEOUS) ×2 IMPLANT
CHLORAPREP W/TINT 26 (MISCELLANEOUS) ×2 IMPLANT
COVER SURGICAL LIGHT HANDLE (MISCELLANEOUS) ×2 IMPLANT
COVER WAND RF STERILE (DRAPES) ×2 IMPLANT
CUTTER FLEX LINEAR 45M (STAPLE) ×2 IMPLANT
DRSG TEGADERM 2-3/8X2-3/4 SM (GAUZE/BANDAGES/DRESSINGS) ×4 IMPLANT
DRSG TEGADERM 4X4.75 (GAUZE/BANDAGES/DRESSINGS) ×2 IMPLANT
ELECT REM PT RETURN 9FT ADLT (ELECTROSURGICAL) ×2
ELECTRODE REM PT RTRN 9FT ADLT (ELECTROSURGICAL) ×1 IMPLANT
GAUZE SPONGE 2X2 8PLY STRL LF (GAUZE/BANDAGES/DRESSINGS) ×1 IMPLANT
GLOVE BIO SURGEON STRL SZ7 (GLOVE) ×2 IMPLANT
GLOVE BIOGEL PI IND STRL 7.5 (GLOVE) ×1 IMPLANT
GLOVE BIOGEL PI INDICATOR 7.5 (GLOVE) ×1
GOWN STRL REUS W/ TWL LRG LVL3 (GOWN DISPOSABLE) ×3 IMPLANT
GOWN STRL REUS W/TWL LRG LVL3 (GOWN DISPOSABLE) ×3
KIT BASIN OR (CUSTOM PROCEDURE TRAY) ×2 IMPLANT
KIT TURNOVER KIT B (KITS) ×2 IMPLANT
NS IRRIG 1000ML POUR BTL (IV SOLUTION) ×2 IMPLANT
PAD ARMBOARD 7.5X6 YLW CONV (MISCELLANEOUS) ×4 IMPLANT
POUCH SPECIMEN RETRIEVAL 10MM (ENDOMECHANICALS) ×2 IMPLANT
RELOAD STAPLE TA45 3.5 REG BLU (ENDOMECHANICALS) ×2 IMPLANT
SET IRRIG TUBING LAPAROSCOPIC (IRRIGATION / IRRIGATOR) ×2 IMPLANT
SET TUBE SMOKE EVAC HIGH FLOW (TUBING) ×2 IMPLANT
SHEARS HARMONIC ACE PLUS 36CM (ENDOMECHANICALS) ×2 IMPLANT
SLEEVE ENDOPATH XCEL 5M (ENDOMECHANICALS) ×2 IMPLANT
SPECIMEN JAR SMALL (MISCELLANEOUS) ×2 IMPLANT
SPONGE GAUZE 2X2 STER 10/PKG (GAUZE/BANDAGES/DRESSINGS) ×1
STRIP CLOSURE SKIN 1/2X4 (GAUZE/BANDAGES/DRESSINGS) ×2 IMPLANT
SUT MNCRL AB 4-0 PS2 18 (SUTURE) ×2 IMPLANT
TOWEL GREEN STERILE (TOWEL DISPOSABLE) ×2 IMPLANT
TOWEL GREEN STERILE FF (TOWEL DISPOSABLE) ×2 IMPLANT
TRAY LAPAROSCOPIC MC (CUSTOM PROCEDURE TRAY) ×2 IMPLANT
TROCAR XCEL BLUNT TIP 100MML (ENDOMECHANICALS) ×2 IMPLANT
TROCAR XCEL NON-BLD 5MMX100MML (ENDOMECHANICALS) ×2 IMPLANT
WATER STERILE IRR 1000ML POUR (IV SOLUTION) ×2 IMPLANT

## 2019-12-04 NOTE — Anesthesia Preprocedure Evaluation (Addendum)
Anesthesia Evaluation  Patient identified by MRN, date of birth, ID band Patient awake    Reviewed: Allergy & Precautions, NPO status , Patient's Chart, lab work & pertinent test results  Airway Mallampati: II  TM Distance: >3 FB Neck ROM: Full    Dental no notable dental hx.    Pulmonary neg pulmonary ROS,    Pulmonary exam normal breath sounds clear to auscultation       Cardiovascular hypertension, Pt. on medications and Pt. on home beta blockers Normal cardiovascular exam Rhythm:Regular Rate:Normal  ECG: SR, rate 84   Neuro/Psych negative neurological ROS  negative psych ROS   GI/Hepatic negative GI ROS, Neg liver ROS,   Endo/Other  diabetes, Insulin Dependent  Renal/GU ESRF and DialysisRenal disease (On P.D. q 12 hours)     Musculoskeletal negative musculoskeletal ROS (+)   Abdominal   Peds  Hematology  (+) anemia ,   Anesthesia Other Findings Appendicitis  Reproductive/Obstetrics                            Anesthesia Physical Anesthesia Plan  ASA: III and emergent  Anesthesia Plan: General   Post-op Pain Management:    Induction: Intravenous  PONV Risk Score and Plan: 4 or greater and Ondansetron, Dexamethasone, Midazolam, Treatment may vary due to age or medical condition and Propofol infusion  Airway Management Planned: Oral ETT  Additional Equipment:   Intra-op Plan:   Post-operative Plan: Extubation in OR  Informed Consent: I have reviewed the patients History and Physical, chart, labs and discussed the procedure including the risks, benefits and alternatives for the proposed anesthesia with the patient or authorized representative who has indicated his/her understanding and acceptance.     Dental advisory given  Plan Discussed with: CRNA  Anesthesia Plan Comments:        Anesthesia Quick Evaluation

## 2019-12-04 NOTE — ED Triage Notes (Signed)
Pt presents to ED after receiving call from out patient imaging center that abd ct showed acute appendicitis. PT is peritoneal dialysis patient. Endorses n/v/diarrhea. She endorses generalized abdominal pain x several weeks that has been worse last 2 days.

## 2019-12-04 NOTE — Transfer of Care (Signed)
Immediate Anesthesia Transfer of Care Note  Patient: Kelly Huff  Procedure(s) Performed: APPENDECTOMY LAPAROSCOPIC (N/A Abdomen)  Patient Location: PACU  Anesthesia Type:General  Level of Consciousness: awake, alert  and oriented  Airway & Oxygen Therapy: Patient Spontanous Breathing and Patient connected to nasal cannula oxygen  Post-op Assessment: Report given to RN and Post -op Vital signs reviewed and stable  Post vital signs: Reviewed and stable  Last Vitals:  Vitals Value Taken Time  BP 168/84   Temp    Pulse 71   Resp 12   SpO2 100     Last Pain:  Vitals:   12/04/19 1732  TempSrc:   PainSc: 4          Complications: No complications documented.

## 2019-12-04 NOTE — H&P (Signed)
History and Physical    Kelly Huff DHR:[id number] DOB: 02/29/1986 DOA: 12/04/2019  PCP: Rubie Maid, MD  Patient coming from: Home.  Chief Complaint: Abdominal pain.  HPI: Kelly Huff is a 34 y.o. 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 Abdominal pain is diffuse. Patient states she has had some abdominal pain about 2 weeks ago which resolved without any intervention. Denies fever chills chest pain or shortness of breath. Patient had gone to her PCP would advise getting CT abdomen which showed features concerning for acute appendicitis and was advised to come to the ER.  ED Course: In the ER general surgery was consulted and patient was taken to the OR for urgent surgery. And I examined the patient after the surgery at this time. Labs are largely unremarkable except for mild anemia. Covid test was negative.  Review of Systems: As per HPI, rest all negative.   Past Medical History:  Diagnosis Date  . Chronic kidney disease   . Diabetes mellitus without complication ([place])   . History of pre-eclampsia 2016   mild  . Hypertension     Past Surgical History:  Procedure Laterality Date  . CESAREAN SECTION    . DILATION AND CURETTAGE OF UTERUS     x 4  . IR FLUORO GUIDE CV LINE RIGHT  12/05/2018  . IR FLUORO GUIDE CV LINE RIGHT  12/13/2018  . IR REMOVAL TUN CV CATH W/O FL  02/22/2019  . IR US GUIDE VASC ACCESS RIGHT  12/05/2018  . IR US GUIDE VASC ACCESS RIGHT  12/13/2018     reports that she has never smoked. She has never used smokeless tobacco. She reports that she does not drink alcohol and does not use drugs.  Allergies  Allergen Reactions  . Morphine Itching and Other (See Comments)    Hallucination  . Sulfamethoxazole-Trimethoprim Hives  . Trulicity [Dulaglutide] Other (See Comments)    She is  DM Type 1. Was prescribed but damaged her kidneys    Family History  Problem Relation Age of Onset  . Hypertension Mother   . Cancer Mother        Uterine vs cervical (pt unsure),   . Schizophrenia Brother   . Breast cancer Neg Hx     Prior to Admission medications   Medication Sig Start Date End Date Taking? Authorizing Provider  amLODipine (NORVASC) 10 MG tablet Take 10 mg by mouth every evening.  11/06/18  Yes [provider]  calcium acetate (PHOSLO) 667 MG tablet Take 1,334 mg by mouth 3 (three) times daily with meals. 11/05/18  Yes [provider]  cloNIDine (CATAPRES - DOSED IN MG/24 HR) 0.1 mg/24hr patch Place 0.1 mg onto the skin See admin instructions. Apply patch on every Sunday 10/10/18  Yes [provider]  cyclobenzaprine (FLEXERIL) 10 MG tablet Take 10 mg by mouth at bedtime as needed for muscle spasms.    Yes [provider]  docusate sodium (COLACE) 100 MG capsule Take 100 mg by mouth daily as needed for mild constipation.    Yes [provider]  gentamicin cream (GARAMYCIN) 0.1 % Apply 1 application topically See admin instructions. Apply small amount to catheter exit site once daily 09/07/18  Yes [provider]  insulin aspart (NOVOLOG) 100 UNIT/ML injection Inject 7 Units into the skin 3 (three)  times daily before meals.   Yes [provider]  insulin glargine (LANTUS) 100 UNIT/ML injection Inject 15 Units into the skin daily.   Yes [provider]  metoprolol succinate (TOPROL-XL) 100 MG 24 hr tablet Take 100 mg by mouth 2 (two) times daily. 10/18/18  Yes [provider]  buPROPion (WELLBUTRIN XL) 150 MG 24 hr tablet TAKE 1 TABLET BY MOUTH EVERY DAY Patient not taking: Reported on 12/04/2019 05/05/19   Rubie Maid, MD    Physical Exam: Constitutional: Moderately built and nourished. Vitals:   12/04/19 1732 12/04/19 1943 12/04/19 1959 12/04/19 2240  BP:  (!) 139/92 (!) 129/92 (!) 166/84    Pulse:  81 81 71  Resp:  (!) 23 (!) 23 12  Temp:    98.1 F (36.7 C)  TempSrc:      SpO2:    100%  Weight: 81.6 kg     Height: 5\' 7"  (1.702 m)      Eyes: Anicteric no pallor. ENMT: No discharge from the ears eyes nose or mouth. Neck: No mass felt. No neck rigidity. Respiratory: No rhonchi or crepitations. Cardiovascular: S1-S2 heard. Abdomen: Soft nontender bowel sounds present. Musculoskeletal: No edema. Skin: No rash. Neurologic: Alert awake oriented to time place and person. Moves all extremities 5 x 5. Psychiatric: Appears normal. Normal affect.   Labs on Admission: I have personally reviewed following labs and imaging studies  CBC: Recent Labs  Lab 12/04/19 1748  WBC 5.3  HGB 11.2*  HCT 35.0*  MCV 86.4  PLT 678   Basic Metabolic Panel: Recent Labs  Lab 12/04/19 1748  NA 134*  K 3.8  CL 94*  CO2 25  GLUCOSE 174*  BUN 49*  CREATININE 16.11*  CALCIUM 8.0*   GFR: Estimated Creatinine Clearance: 5.4 mL/min (A) (by C-G formula based on SCr of 16.11 mg/dL (H)). Liver Function Tests: Recent Labs  Lab 12/04/19 1748  AST 9*  ALT 8  ALKPHOS 106  BILITOT 0.3  PROT 5.8*  ALBUMIN 3.0*   Recent Labs  Lab 12/04/19 1748  LIPASE 30   No results for input(s): AMMONIA in the last 168 hours. Coagulation Profile: No results for input(s): INR, PROTIME in the last 168 hours. Cardiac Enzymes: No results for input(s): CKTOTAL, CKMB, CKMBINDEX, TROPONINI in the last 168 hours. BNP (last 3 results) No results for input(s): PROBNP in the last 8760 hours. HbA1C: No results for input(s): HGBA1C in the last 72 hours. CBG: Recent Labs  Lab 12/04/19 2242  GLUCAP 297*   Lipid Profile: No results for input(s): CHOL, HDL, LDLCALC, TRIG, CHOLHDL, LDLDIRECT in the last 72 hours. Thyroid Function Tests: No results for input(s): TSH, T4TOTAL, FREET4, T3FREE, THYROIDAB in the last 72 hours. Anemia Panel: No results for input(s): VITAMINB12, FOLATE, FERRITIN, TIBC,  IRON, RETICCTPCT in the last 72 hours. Urine analysis:    Component Value Date/Time   COLORURINE STRAW (A) 11/25/2018 1224   APPEARANCEUR CLEAR 11/25/2018 1224   APPEARANCEUR Clear 06/25/2017 1042   LABSPEC 1.010 11/25/2018 1224   PHURINE 7.0 11/25/2018 1224   GLUCOSEU 50 (A) 11/25/2018 1224   HGBUR MODERATE (A) 11/25/2018 1224   BILIRUBINUR NEGATIVE 11/25/2018 1224   BILIRUBINUR neg 08/05/2017 1514   BILIRUBINUR Negative 06/25/2017 1042   KETONESUR NEGATIVE 11/25/2018 1224   PROTEINUR >=300 (A) 11/25/2018 1224   UROBILINOGEN 0.2 08/05/2017 1514   NITRITE NEGATIVE 11/25/2018 1224   LEUKOCYTESUR NEGATIVE 11/25/2018 1224   Sepsis Labs: @LABRCNTIP (procalcitonin:4,lacticidven:4) ) Recent Results (from the past 240  hour(s))  SARS Coronavirus 2 by RT PCR (hospital order, performed in [facility] hospital lab) Nasopharyngeal Nasopharyngeal Swab     Status: None   Collection Time: 12/04/19  7:32 PM   Specimen: Nasopharyngeal Swab  Result Value Ref Range Status   SARS Coronavirus 2 NEGATIVE NEGATIVE Final    Comment: (NOTE) SARS-CoV-2 target nucleic acids are NOT DETECTED.  The SARS-CoV-2 RNA is generally detectable in upper and lower respiratory specimens during the acute phase of infection. The lowest concentration of SARS-CoV-2 viral copies this assay can detect is 250 copies / mL. A negative result does not preclude SARS-CoV-2 infection and should not be used as the sole basis for treatment or other patient management decisions.  A negative result may occur with improper specimen collection / handling, submission of specimen other than nasopharyngeal swab, presence of viral mutation(s) within the areas targeted by this assay, and inadequate number of viral copies (<250 copies / mL). A negative result must be combined with clinical observations, patient history, and epidemiological information.  Fact Sheet for Patients:   StrictlyIdeas.no  Fact Sheet  for Healthcare Providers: BankingDealers.co.za  This test is not yet approved or  cleared by the [place] FDA and has been authorized for detection and/or diagnosis of SARS-CoV-2 by FDA under an Emergency Use Authorization (EUA).  This EUA will remain in effect (meaning this test can be used) for the duration of the COVID-19 declaration under Section 564(b)(1) of the Act, 21 U.S.C. section 360bbb-3(b)(1), unless the authorization is terminated or revoked sooner.  Performed at [facility], [place] [address] [place], [place] [address]      Radiological Exams on Admission: CT ABDOMEN PELVIS WO CONTRAST  Result Date: 12/04/2019 CLINICAL DATA:  Abdominal pain and nausea and vomiting for 2 weeks. EXAM: CT ABDOMEN AND PELVIS WITHOUT CONTRAST TECHNIQUE: Multidetector CT imaging of the abdomen and pelvis was performed following the standard protocol without IV contrast. COMPARISON:  11/25/2018 FINDINGS: Lower chest: No acute findings. Hepatobiliary: No mass visualized on this unenhanced exam. Gallbladder is unremarkable. No evidence of biliary ductal dilatation. Pancreas: No mass or inflammatory process visualized on this unenhanced exam. Spleen:  Within normal limits in size. Adrenals/Urinary tract: No evidence of urolithiasis or hydronephrosis. Unremarkable unopacified urinary bladder. Stomach/Bowel: Findings of acute appendicitis are seen as follows: Appendix: Location- Standard Diameter-15 mm Appendicolith- Present Mucosal hyper-enhancement-n/a Extraluminal Gas- Absent Periappendiceal Collection- None Vascular/Lymphatic: No pathologically enlarged lymph nodes identified. No evidence of abdominal aortic aneurysm. Reproductive:  No mass or other significant abnormality. Other: A small amount of free intraperitoneal air and intraperitoneal fluid are seen which is attributable to peritoneal dialysis catheter, which is seen with distal tip in the upper pelvis. Musculoskeletal:   No suspicious bone lesions identified. IMPRESSION: Positive for acute appendicitis. Small amount of free intraperitoneal air and intraperitoneal fluid, attributable to peritoneal dialysis catheter. These results will be called to the ordering clinician or representative by the Radiologist Assistant, and communication documented in the PACS or [employer]. Electronically Signed   By: Marlaine Hind M.D.   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 Acute appendicitis status post surgery discussed with on-call general surgeon Dr. Georgette Dover where this time advised that patient's diet can be  advanced no need to continue antibiotics. Pain relief medications. 2. ESRD on peritoneal dialysis per general surgery patient's peritoneal dialysis to be held for few days will need to get nephrology consult for further recommendations. 3. Hypertension on clonidine patch amlodipine and metoprolol.  4. Diabetes mellitus type 1 on Lantus insulin 15 units twice daily as per the patient. Sliding scale coverage. 5. Anemia likely from renal disease follow CBC. 6. History of atypical hemolytic uremic syndrome being followed by Dr.Brahmanday and due for next dose of eculizumab on January 03, 2020.  Since patient will need close monitoring and also likely require hemodialysis with patient presently having surgery for acute appendicitis will need more than two midnight stay in inpatient status.   DVT prophylaxis: SCDs for now. General surgery said that patient can have pharmacological DVT prophylaxis from tomorrow. Code Status: Full code. Family Communication: Discussed with patient. Disposition Plan: Home. Consults called: General surgery. Admission status:  Inpatient.   Rise Patience MD [facility] Pager [phone].  If 7PM-7AM, please contact night-coverage [email or web address] Password [email or web address]  12/04/2019, 11:10 PM

## 2019-12-04 NOTE — Consult Note (Addendum)
Reason for Consult:Appendicitis Referring Physician: Colodonato  Kelly Huff is an 34 y.o. female.  HPI: This is a 34 year old female with a history of diabetes, end-stage renal disease on peritoneal dialysis who presented to the emergency department with several days of lower abdominal pain, nausea, vomiting, and diarrhea.  She denies any fever.  She was sent for an outpatient CT scan of the abdomen.  This shows signs of appendicitis with no sign of perforation or abscess.  She is instructed to come directly to the emergency department.  Covid test is pending.  Past Medical History:  Diagnosis Date  . Chronic kidney disease   . Diabetes mellitus without complication ([place])   . History of pre-eclampsia 2016   mild  . Hypertension     Past Surgical History:  Procedure Laterality Date  . CESAREAN SECTION    . DILATION AND CURETTAGE OF UTERUS     x 4  . IR FLUORO GUIDE CV LINE RIGHT  12/05/2018  . IR FLUORO GUIDE CV LINE RIGHT  12/13/2018  . IR REMOVAL TUN CV CATH W/O FL  02/22/2019  . IR US GUIDE VASC ACCESS RIGHT  12/05/2018  . IR US GUIDE VASC ACCESS RIGHT  12/13/2018    Family History  Problem Relation Age of Onset  . Hypertension Mother   . Cancer Mother        Uterine vs cervical (pt unsure),   . Schizophrenia Brother   . Breast cancer Neg Hx     Social History:  reports that she has never smoked. She has never used smokeless tobacco. She reports that she does not drink alcohol and does not use drugs.  Allergies:  Allergies  Allergen Reactions  . Morphine Itching and Other (See Comments)    Hallucination  . Sulfamethoxazole-Trimethoprim Hives  . Trulicity [Dulaglutide] Other (See Comments)    She is DM Type 1. Was prescribed but damaged her kidneys    Medications:  Prior to Admission medications   Medication Sig Start Date End Date Taking? Authorizing Provider  amLODipine (NORVASC) 10 MG tablet Take 10 mg by mouth every evening.  11/06/18   [provider]   B Complex-C-Folic Acid (RENO CAPS) 1 MG CAPS Take 1 mg by mouth daily. 08/02/18   [provider]  buPROPion (WELLBUTRIN XL) 150 MG 24 hr tablet TAKE 1 TABLET BY MOUTH EVERY DAY 05/05/19   Rubie Maid, MD  calcitRIOL (ROCALTROL) 0.25 MCG capsule Take 0.25 mcg by mouth daily.    [provider]  calcium acetate (PHOSLO) 667 MG tablet Take 1,334 mg by mouth 3 (three) times daily with meals. 11/05/18   [provider]  cloNIDine (CATAPRES - DOSED IN MG/24 HR) 0.1 mg/24hr patch Place 0.1 mg onto the skin See admin instructions. Apply patch on every Sunday 10/10/18   [provider]  cyclobenzaprine (FLEXERIL) 10 MG tablet Take 10 mg by mouth at bedtime as needed for muscle spasms.     [provider]  docusate sodium (COLACE) 100 MG capsule Take 100 mg by mouth 2 (two) times daily.    [provider]  gentamicin cream (GARAMYCIN) 0.1 % Apply 1 application topically See admin instructions. Apply small amount to catheter exit site once daily 09/07/18   [provider]  Insulin Human (INSULIN PUMP) SOLN Inject 1 each into the skin continuous. Novolog. Patient stated she is not sure how much insulin is given into the pump    [provider]  lactulose ([place]) 10  GM/15ML solution Take 20 g by mouth at bedtime as needed (constipation).  11/22/18   [provider]  metoprolol succinate (TOPROL-XL) 100 MG 24 hr tablet Take 100 mg by mouth 2 (two) times daily. 10/18/18   [provider]  olmesartan (BENICAR) 40 MG tablet Take 40 mg by mouth every morning. 10/13/18   [provider]     Results for orders placed or performed during the hospital encounter of 12/04/19 (from the past 48 hour(s))  Lipase, blood     Status: None   Collection Time: 12/04/19  5:48 PM  Result Value Ref Range   Lipase 30 11 - 51 U/L    Comment: Performed at [facility], [place] [address], [place], [place] [address]  Comprehensive  metabolic panel     Status: Abnormal   Collection Time: 12/04/19  5:48 PM  Result Value Ref Range   Sodium 134 (L) 135 - 145 mmol/L   Potassium 3.8 3.5 - 5.1 mmol/L   Chloride 94 (L) 98 - 111 mmol/L   CO2 25 22 - 32 mmol/L   Glucose, Bld 174 (H) 70 - 99 mg/dL    Comment: Glucose reference range applies only to samples taken after fasting for at least 8 hours.   BUN 49 (H) 6 - 20 mg/dL   Creatinine, Ser 16.11 (H) 0.44 - 1.00 mg/dL   Calcium 8.0 (L) 8.9 - 10.3 mg/dL   Total Protein 5.8 (L) 6.5 - 8.1 g/dL   Albumin 3.0 (L) 3.5 - 5.0 g/dL   AST 9 (L) 15 - 41 U/L   ALT 8 0 - 44 U/L   Alkaline Phosphatase 106 38 - 126 U/L   Total Bilirubin 0.3 0.3 - 1.2 mg/dL   GFR calc non Af Amer 3 (L) >60 mL/min   GFR calc Af Amer 3 (L) >60 mL/min   Anion gap 15 5 - 15    Comment: Performed at [facility] [address], [place], [place] [address]  CBC     Status: Abnormal   Collection Time: 12/04/19  5:48 PM  Result Value Ref Range   WBC 5.3 4.0 - 10.5 K/uL   RBC 4.05 3.87 - 5.11 MIL/uL   Hemoglobin 11.2 (L) 12.0 - 15.0 g/dL   HCT 35.0 (L) 36 - 46 %   MCV 86.4 80.0 - 100.0 fL   MCH 27.7 26.0 - 34.0 pg   MCHC 32.0 30.0 - 36.0 g/dL   RDW 13.8 11.5 - 15.5 %   Platelets 312 150 - 400 K/uL   nRBC 0.0 0.0 - 0.2 %    Comment: Performed at [facility], [place] [address], [place], [place] [address]  I-Stat beta hCG blood, ED     Status: None   Collection Time: 12/04/19  6:03 PM  Result Value Ref Range   I-stat hCG, quantitative <5.0 <5 mIU/mL   Comment 3            Comment:   GEST. AGE      CONC.  (mIU/mL)   <=1 WEEK        5 - 50     2 WEEKS       50 - 500     3 WEEKS       100 - 10,000     4 WEEKS     1,000 - 30,000        FEMALE AND NON-PREGNANT FEMALE:     LESS THAN  5 mIU/mL     CT ABDOMEN PELVIS WO CONTRAST  Result Date: 12/04/2019 CLINICAL DATA:  Abdominal pain and nausea and vomiting for 2 weeks. EXAM: CT ABDOMEN AND PELVIS WITHOUT CONTRAST TECHNIQUE: Multidetector CT  imaging of the abdomen and pelvis was performed following the standard protocol without IV contrast. COMPARISON:  11/25/2018 FINDINGS: Lower chest: No acute findings. Hepatobiliary: No mass visualized on this unenhanced exam. Gallbladder is unremarkable. No evidence of biliary ductal dilatation. Pancreas: No mass or inflammatory process visualized on this unenhanced exam. Spleen:  Within normal limits in size. Adrenals/Urinary tract: No evidence of urolithiasis or hydronephrosis. Unremarkable unopacified urinary bladder. Stomach/Bowel: Findings of acute appendicitis are seen as follows: Appendix: Location- Standard Diameter-15 mm Appendicolith- Present Mucosal hyper-enhancement-n/a Extraluminal Gas- Absent Periappendiceal Collection- None Vascular/Lymphatic: No pathologically enlarged lymph nodes identified. No evidence of abdominal aortic aneurysm. Reproductive:  No mass or other significant abnormality. Other: A small amount of free intraperitoneal air and intraperitoneal fluid are seen which is attributable to peritoneal dialysis catheter, which is seen with distal tip in the upper pelvis. Musculoskeletal:  No suspicious bone lesions identified. IMPRESSION: Positive for acute appendicitis. Small amount of free intraperitoneal air and intraperitoneal fluid, attributable to peritoneal dialysis catheter. These results will be called to the ordering clinician or representative by the Radiologist Assistant, and communication documented in the PACS or [employer]. Electronically Signed   By: Marlaine Hind M.D.   On: 12/04/2019 14:27    Review of Systems  HENT: Negative for ear discharge, ear pain, hearing loss and tinnitus.   Eyes: Negative for photophobia and pain.  Respiratory: Negative for cough and shortness of breath.   Cardiovascular: Negative for chest pain.  Gastrointestinal: Positive for abdominal pain, diarrhea, nausea and vomiting.  Genitourinary: Negative for dysuria, flank pain, frequency and  urgency.  Musculoskeletal: Negative for back pain, myalgias and neck pain.  Neurological: Negative for dizziness and headaches.  Hematological: Does not bruise/bleed easily.  Psychiatric/Behavioral: The patient is not nervous/anxious.    Blood pressure (!) 129/92, pulse 81, temperature 98.9 F (37.2 C), temperature source Oral, resp. rate (!) 23, height 5\' 7"  (1.702 m), weight 81.6 kg, SpO2 99 %, unknown if currently breastfeeding. Physical Exam Constitutional:  WDWN in NAD, conversant, no obvious deformities; lying in bed comfortably Eyes:  Pupils equal, round; sclera anicteric; moist conjunctiva; no lid lag HENT:  Oral mucosa moist; good dentition  Neck:  No masses palpated, trachea midline; no thyromegaly Lungs:  CTA bilaterally; normal respiratory effort CV:  Regular rate and rhythm; no murmurs; extremities well-perfused with no edema Abd:  +bowel sounds, soft, tender in RLQ, no palpable organomegaly; no palpable hernias; peritoneal dialysis catheter Musc:  Unable to assess gait; no apparent clubbing or cyanosis in extremities Lymphatic:  No palpable cervical or axillary lymphadenopathy Skin:  Warm, dry; no sign of jaundice Psychiatric - alert and oriented x 4; calm mood and affect  Assessment/Plan: Acute appendicitis ESRD on hemodialysis DM  TRH to admit to manage multiple medical issues.  Will not be able to do full-volume PD for a few delays until the wounds heal.  Will plan laparoscopic appendectomy either tonight or tomorrow depending on OR availability.  The surgical procedure has been discussed with the patient.  Potential risks, benefits, alternative treatments, and expected outcomes have been explained.  All of the patient's questions at this time have been answered.  The likelihood of reaching the patient's treatment goal is good.  The patient understand the proposed surgical procedure and wishes  to proceed.   Imogene Burn Antasia Haider 12/04/2019, 8:14 PM

## 2019-12-04 NOTE — ED Provider Notes (Signed)
[facility] EMERGENCY DEPARTMENT Provider Note   CSN: [id number] Arrival date & time: 12/04/19  1622     History Chief Complaint  Patient presents with  . Abdominal Pain    Kelly Huff is a 34 y.o. female.  Patient is a 34 year old female with past medical history of diabetes, end-stage renal disease on peritoneal dialysis presenting to the emergency department for abdominal pain and appendicitis shown on outpatient CT scan.  Patient reports that the Saturday she began to have severe abdominal pain with nausea vomiting and diarrhea.  She denies any fever.  Reports that today it actually feels a little bit better but she is still having abdominal pain and nausea and unable to keep anything.  She had an outpatient CT scan of the abdomen done today and it showed signs of appendicitis without perforation or abscess.  Previous abdominal surgeries include C-section and her peritoneal dialysis         Past Medical History:  Diagnosis Date  . Chronic kidney disease   . Diabetes mellitus without complication ([place])   . History of pre-eclampsia 2016   mild  . Hypertension     Patient Active Problem List   Diagnosis Date Noted  . Hyperkalemia 12/13/2018  . Peritoneal dialysis catheter dysfunction ([place]) 12/03/2018  . PD catheter dysfunction ([place]) 12/03/2018  . Acute encephalopathy 09/03/2018  . Hypertensive urgency 09/03/2018  . Enteritis 07/27/2018  . ESRD on dialysis ([place]) 07/27/2018  . Hypertension associated with diabetes ([place]) 07/27/2018  . 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 Elevated troponin 08/14/2017  . CKD (chronic kidney disease) stage 3, GFR 30-59 ml/min 08/14/2017  . 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 Diabetes ([place]) 04/13/2017  . Acute renal failure ([place])   . Family history of BRCA gene positive 07/13/2013  . 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 CESAREAN SECTION    . DILATION AND CURETTAGE OF UTERUS     x 4  . IR FLUORO GUIDE CV LINE RIGHT  12/05/2018  . IR FLUORO GUIDE CV LINE RIGHT  12/13/2018  . IR REMOVAL TUN CV CATH W/O FL  02/22/2019  . IR US GUIDE VASC ACCESS RIGHT  12/05/2018  . IR US GUIDE VASC ACCESS RIGHT  12/13/2018     OB History    Gravida  5   Para  3   Term  2   Preterm  1   AB  2   Living  3     SAB      TAB  0   Ectopic      Multiple      Live Births  3           Family History  Problem Relation Age of Onset  . Hypertension Mother   . Cancer Mother        Uterine vs cervical (pt unsure),   . Schizophrenia Brother   . Breast cancer Neg Hx     Social History   Tobacco Use  . Smoking status: Never Smoker  . Smokeless tobacco: Never Used  Vaping Use  . Vaping Use: Never used  Substance Use Topics  . Alcohol use: No  . Drug use: No    Home Medications Prior to Admission medications   Medication Sig Start Date End Date  Taking? Authorizing Provider  amLODipine (NORVASC) 10 MG tablet Take 10 mg by mouth every evening.  11/06/18   [provider]  B Complex-C-Folic Acid (RENO CAPS) 1 MG CAPS Take 1 mg by mouth daily. 08/02/18   [provider]  buPROPion (WELLBUTRIN XL) 150 MG 24 hr tablet TAKE 1 TABLET BY MOUTH EVERY DAY 05/05/19   Rubie Maid, MD  calcitRIOL (ROCALTROL) 0.25 MCG capsule Take 0.25 mcg by mouth daily.    [provider]  calcium acetate (PHOSLO) 667 MG tablet Take 1,334 mg by mouth 3 (three) times daily with meals. 11/05/18   [provider]  cloNIDine (CATAPRES - DOSED IN MG/24 HR) 0.1 mg/24hr patch Place 0.1 mg onto the skin See admin instructions. Apply patch on every Sunday 10/10/18   [provider]  cyclobenzaprine (FLEXERIL) 10 MG tablet Take 10 mg by mouth at bedtime as needed for muscle spasms.      [provider]  docusate sodium (COLACE) 100 MG capsule Take 100 mg by mouth 2 (two) times daily.    [provider]  gentamicin cream (GARAMYCIN) 0.1 % Apply 1 application topically See admin instructions. Apply small amount to catheter exit site once daily 09/07/18   [provider]  Insulin Human (INSULIN PUMP) SOLN Inject 1 each into the skin continuous. Novolog. Patient stated she is not sure how much insulin is given into the pump    [provider]  lactulose (CHRONULAC) 10 GM/15ML solution Take 20 g by mouth at bedtime as needed (constipation).  11/22/18   [provider]  metoprolol succinate (TOPROL-XL) 100 MG 24 hr tablet Take 100 mg by mouth 2 (two) times daily. 10/18/18   [provider]  olmesartan (BENICAR) 40 MG tablet Take 40 mg by mouth every morning. 10/13/18   [provider]    Allergies    Morphine, Sulfamethoxazole-trimethoprim, and Trulicity [dulaglutide]  Review of Systems   Review of Systems  Constitutional: Positive for appetite change. Negative for chills, fatigue and fever.  Respiratory: Negative for shortness of breath.   Cardiovascular: Negative for chest pain.  Gastrointestinal: Positive for abdominal pain, diarrhea, nausea and vomiting. Negative for abdominal distention and blood in stool.  Genitourinary: Negative for decreased urine volume, dysuria and urgency.  Musculoskeletal: Negative for arthralgias and back pain.  Skin: Negative for rash.  Neurological: Negative for dizziness, light-headedness and headaches.  All other systems reviewed and are negative.   Physical Exam Updated Vital Signs BP (!) 138/93 (BP Location: Right Arm)   Pulse 78   Temp 98.9 F (37.2 C) (Oral)   Resp 14   Ht '5\' 7"'  (1.702 m)   Wt 81.6 kg   SpO2 99%   BMI 28.19 kg/m   Physical Exam Vitals and nursing note reviewed.  Constitutional:      General: She is not in acute distress.    Appearance: Normal  appearance. She is well-developed. She is not ill-appearing, toxic-appearing or diaphoretic.  HENT:     Head: Normocephalic.  Eyes:     Conjunctiva/sclera: Conjunctivae normal.  Cardiovascular:     Rate and Rhythm: Normal rate and regular rhythm.     Heart sounds: Normal heart sounds.  Pulmonary:     Effort: Pulmonary effort is normal.  Abdominal:     General: Abdomen is flat. Bowel sounds are normal.     Palpations: Abdomen is soft.     Tenderness: There is generalized abdominal tenderness. There is no guarding or rebound.  Comments: Right-sided peritoneal dialysis port present  Skin:    General: Skin is warm and dry.  Neurological:     Mental Status: She is alert.  Psychiatric:        Mood and Affect: Mood normal.     ED Results / Procedures / Treatments   Labs (all labs ordered are listed, but only abnormal results are displayed) Labs Reviewed  COMPREHENSIVE METABOLIC PANEL - Abnormal; Notable for the following components:      Result Value   Sodium 134 (*)    Chloride 94 (*)    Glucose, Bld 174 (*)    BUN 49 (*)    Creatinine, Ser 16.11 (*)    Calcium 8.0 (*)    Total Protein 5.8 (*)    Albumin 3.0 (*)    AST 9 (*)    GFR calc non Af Amer 3 (*)    GFR calc Af Amer 3 (*)    All other components within normal limits  CBC - Abnormal; Notable for the following components:   Hemoglobin 11.2 (*)    HCT 35.0 (*)    All other components within normal limits  SARS CORONAVIRUS 2 BY RT PCR (HOSPITAL ORDER, [facility])  LIPASE, BLOOD  URINALYSIS, ROUTINE W REFLEX MICROSCOPIC  LACTIC ACID, PLASMA  LACTIC ACID, PLASMA  I-STAT BETA HCG BLOOD, ED ([facility], [facility], [facility] ONLY)    EKG None  Radiology CT ABDOMEN PELVIS WO CONTRAST  Result Date: 12/04/2019 CLINICAL DATA:  Abdominal pain and nausea and vomiting for 2 weeks. EXAM: CT ABDOMEN AND PELVIS WITHOUT CONTRAST TECHNIQUE: Multidetector CT imaging of the abdomen and pelvis was performed following the  standard protocol without IV contrast. COMPARISON:  11/25/2018 FINDINGS: Lower chest: No acute findings. Hepatobiliary: No mass visualized on this unenhanced exam. Gallbladder is unremarkable. No evidence of biliary ductal dilatation. Pancreas: No mass or inflammatory process visualized on this unenhanced exam. Spleen:  Within normal limits in size. Adrenals/Urinary tract: No evidence of urolithiasis or hydronephrosis. Unremarkable unopacified urinary bladder. Stomach/Bowel: Findings of acute appendicitis are seen as follows: Appendix: Location- Standard Diameter-15 mm Appendicolith- Present Mucosal hyper-enhancement-n/a Extraluminal Gas- Absent Periappendiceal Collection- None Vascular/Lymphatic: No pathologically enlarged lymph nodes identified. No evidence of abdominal aortic aneurysm. Reproductive:  No mass or other significant abnormality. Other: A small amount of free intraperitoneal air and intraperitoneal fluid are seen which is attributable to peritoneal dialysis catheter, which is seen with distal tip in the upper pelvis. Musculoskeletal:  No suspicious bone lesions identified. IMPRESSION: Positive for acute appendicitis. Small amount of free intraperitoneal air and intraperitoneal fluid, attributable to peritoneal dialysis catheter. These results will be called to the ordering clinician or representative by the Radiologist Assistant, and communication documented in the PACS or [employer]. Electronically Signed   By: Marlaine Hind M.D.   On: 12/04/2019 14:27    Procedures Procedures (including critical care time)  Medications Ordered in ED Medications  sodium chloride flush (NS) 0.9 % injection 3 mL (has no administration in time range)    ED Course  I have reviewed the triage vital signs and the nursing notes.  Pertinent labs & imaging results that were available during my care of the patient were reviewed by me and considered in my medical decision making (see chart for  details).  Clinical Course as of Dec 04 2130  Mon Dec 04, 9767  945 34 year old female with diabetes, end-stage renal disease on peritoneal dialysis here for abdominal pain, nausea, vomiting,  anorexia since Saturday.  Outpatient CT scan showing evidence of appendicitis without perforation or abscess.  Patient is stable here with a normal white count.  Her labs are at her baseline.  Afebrile.  I paged general surgery.  Dr. Prince Solian is in surgery at this moment but I spoke with the secretary who will pass the message along to the attending physician and they will consult on the patient once out of current surgery. To be admitted to medicine    [KM]    Clinical Course User Index [KM] Kristine Royal   MDM Rules/Calculators/A&P                          The patient appears reasonably stabilized for admission considering the current resources, flow, and capabilities available in the ED at this time, and I doubt any other [facility] requiring further screening and/or treatment in the ED prior to admission.  Final Clinical Impression(s) / ED Diagnoses Final diagnoses:  None    Rx / DC Orders ED Discharge Orders    None       Kristine Royal 12/04/19 2132    Charlesetta Shanks, MD 12/04/19 [phone]

## 2019-12-04 NOTE — Anesthesia Procedure Notes (Signed)
Procedure Name: Intubation Date/Time: 12/04/2019 9:40 PM Performed by: Jearld Pies, CRNA Pre-anesthesia Checklist: Patient identified, Emergency Drugs available, Suction available and Patient being monitored Patient Re-evaluated:Patient Re-evaluated prior to induction Oxygen Delivery Method: Circle System Utilized Preoxygenation: Pre-oxygenation with 100% oxygen Induction Type: IV induction Ventilation: Mask ventilation without difficulty Laryngoscope Size: Miller and 2 Grade View: Grade I Tube type: Oral Tube size: 7.0 mm Number of attempts: 1 Airway Equipment and Method: Stylet Placement Confirmation: ETT inserted through vocal cords under direct vision,  positive ETCO2 and breath sounds checked- equal and bilateral Secured at: 22 cm Tube secured with: Tape Dental Injury: Teeth and Oropharynx as per pre-operative assessment

## 2019-12-04 NOTE — Op Note (Signed)
Appendectomy, Lap, Procedure Note  Indications: The patient presented with a 3 day history of right-sided abdominal pain. A CT revealed findings consistent with acute appendicitis.  She has an indwelling peritoneal dialysis catheter.  Pre-operative Diagnosis: Acute appendicitis without mention of peritonitis  Post-operative Diagnosis: Same  Surgeon: Maia Petties   Assistants: none  Anesthesia: General endotracheal anesthesia  ASA Class: 2E  Procedure Details  The patient was seen again in the Holding Room. The risks, benefits, complications, treatment options, and expected outcomes were discussed with the patient and/or family. The possibilities of reaction to medication, perforation of viscus, bleeding, recurrent infection, finding a normal appendix, the need for additional procedures, failure to diagnose a condition, and creating a complication requiring transfusion or operation were discussed. There was concurrence with the proposed plan and informed consent was obtained. The site of surgery was properly noted. The patient was taken to Operating Room, identified as Jonne Ply and the procedure verified as Appendectomy. A Time Out was held and the above information confirmed.  The patient was placed in the supine position and general anesthesia was induced.  The abdomen was prepped and draped in a sterile fashion. A one centimeter supraumbilical incision was made.  Dissection was carried down to the fascia bluntly.  The fascia was incised vertically.  We entered the peritoneal cavity bluntly.  A pursestring suture was passed around the incision with a 0 Vicryl.  The Hasson cannula was introduced into the abdomen and the tails of the suture were used to hold the Hasson in place.   The pneumoperitoneum was then established maintaining a maximum pressure of 15 mmHg.  Additional 5 mm cannulas then placed in the left lower quadrant of the abdomen and the right upper quadrant under direct  visualization. A careful evaluation of the entire abdomen was carried out. The peritoneal dialysis catheter is in the left side of the pelvis and seems to be adherent to the left fallopian tube.  The end of the catheter is not obstructed.  The patient was placed in Trendelenburg and left lateral decubitus position.  The scope was moved to the right upper quadrant port site. The cecum was mobilized medially.  The appendix was identified and there was no sign of perforation.  The appendix is quite thickened.  The appendix was carefully dissected. The appendix was skeletonized with the harmonic scalpel.   The appendix was divided at its base using an endo-GIA stapler. Minimal appendiceal stump was left in place. There was no evidence of bleeding, leakage, or complication after division of the appendix. Irrigation was also performed and irrigate suctioned from the abdomen as well.  The umbilical port site was closed with the purse string suture. There was no residual palpable fascial defect.  The trocar site skin wounds were closed with 4-0 Monocryl.  Instrument, sponge, and needle counts were correct at the conclusion of the case.   Findings: The appendix was found to be inflamed. There were not signs of necrosis.  There was not perforation. There was not abscess formation.  Estimated Blood Loss:  Minimal         Drains: none         Specimens: appendix         Complications:  None; patient tolerated the procedure well.         Disposition: PACU - hemodynamically stable.         Condition: stable  Imogene Burn. Georgette Dover, MD, [facility]  General/ Trauma  Surgery   12/04/2019 10:30 PM

## 2019-12-05 ENCOUNTER — Encounter (HOSPITAL_COMMUNITY): Payer: Self-pay | Admitting: Surgery

## 2019-12-05 DIAGNOSIS — D593 Hemolytic-uremic syndrome: Secondary | ICD-10-CM

## 2019-12-05 LAB — BASIC METABOLIC PANEL
Anion gap: 16 — ABNORMAL HIGH (ref 5–15)
BUN: 54 mg/dL — ABNORMAL HIGH (ref 6–20)
CO2: 22 mmol/L (ref 22–32)
Calcium: 8.1 mg/dL — ABNORMAL LOW (ref 8.9–10.3)
Chloride: 95 mmol/L — ABNORMAL LOW (ref 98–111)
Creatinine, Ser: 16.7 mg/dL — ABNORMAL HIGH (ref 0.44–1.00)
GFR calc Af Amer: 3 mL/min — ABNORMAL LOW (ref >60)
GFR calc non Af Amer: 2 mL/min — ABNORMAL LOW (ref >60)
Glucose, Bld: 279 mg/dL — ABNORMAL HIGH (ref 70–99)
Potassium: 4.3 mmol/L (ref 3.5–5.1)
Sodium: 133 mmol/L — ABNORMAL LOW (ref 135–145)

## 2019-12-05 LAB — GLUCOSE, CAPILLARY
Glucose-Capillary: 303 mg/dL — ABNORMAL HIGH (ref 70–99)
Glucose-Capillary: 325 mg/dL — ABNORMAL HIGH (ref 70–99)
Glucose-Capillary: 286 mg/dL — ABNORMAL HIGH (ref 70–99)
Glucose-Capillary: 92 mg/dL (ref 70–99)

## 2019-12-05 LAB — CBC
HCT: 32.1 % — ABNORMAL LOW (ref 36.0–46.0)
Hemoglobin: 10.3 g/dL — ABNORMAL LOW (ref 12.0–15.0)
MCH: 27 pg (ref 26.0–34.0)
MCHC: 32.1 g/dL (ref 30.0–36.0)
MCV: 84.3 fL (ref 80.0–100.0)
Platelets: 306 10*3/uL (ref 150–400)
RBC: 3.81 MIL/uL — ABNORMAL LOW (ref 3.87–5.11)
RDW: 13.7 % (ref 11.5–15.5)
WBC: 8.2 10*3/uL (ref 4.0–10.5)
nRBC: 0 % (ref 0.0–0.2)

## 2019-12-05 LAB — HIV ANTIBODY (ROUTINE TESTING W REFLEX): HIV Screen 4th Generation wRfx: NONREACTIVE

## 2019-12-05 MED ORDER — PHENYLEPHRINE HCL-NACL 10-0.9 MG/250ML-% IV SOLN
INTRAVENOUS | Status: AC
  Filled 2019-12-05: qty 250

## 2019-12-05 MED ORDER — DELFLEX-LC/1.5% DEXTROSE 344 MOSM/L IP SOLN: INTRAPERITONEAL | Status: DC

## 2019-12-05 MED ORDER — POLYETHYLENE GLYCOL 3350 17 G PO PACK: 17.0000 g | PACK | Freq: Every day | ORAL | Status: DC | PRN

## 2019-12-05 MED ORDER — GENTAMICIN SULFATE 0.1 % EX CREA: 1.0000 "application " | TOPICAL_CREAM | Freq: Every day | CUTANEOUS | Status: DC

## 2019-12-05 MED ORDER — INSULIN GLARGINE 100 UNIT/ML ~~LOC~~ SOLN
15.0000 [IU] | Freq: Two times a day (BID) | SUBCUTANEOUS | Status: DC
  Administered 2019-12-05 – 2019-12-06 (×3): 15 [IU] via SUBCUTANEOUS
  Filled 2019-12-05 (×4): qty 0.15

## 2019-12-05 MED ORDER — DOCUSATE SODIUM 100 MG PO CAPS
100.0000 mg | ORAL_CAPSULE | Freq: Two times a day (BID) | ORAL | Status: DC
  Administered 2019-12-05 – 2019-12-06 (×3): 100 mg via ORAL
  Filled 2019-12-05 (×3): qty 1

## 2019-12-05 MED ORDER — INSULIN ASPART 100 UNIT/ML ~~LOC~~ SOLN
7.0000 [IU] | Freq: Three times a day (TID) | SUBCUTANEOUS | Status: DC
  Administered 2019-12-05 – 2019-12-06 (×3): 7 [IU] via SUBCUTANEOUS

## 2019-12-05 MED ORDER — OXYCODONE HCL 5 MG PO TABS
5.0000 mg | ORAL_TABLET | ORAL | Status: DC | PRN
  Administered 2019-12-05 (×2): 10 mg via ORAL
  Filled 2019-12-05 (×2): qty 2

## 2019-12-05 MED ORDER — ACETAMINOPHEN 325 MG PO TABS: 650.0000 mg | ORAL_TABLET | ORAL | Status: DC | PRN

## 2019-12-05 MED ORDER — GLUCAGON HCL RDNA (DIAGNOSTIC) 1 MG IJ SOLR
INTRAMUSCULAR | Status: AC
  Filled 2019-12-05: qty 1

## 2019-12-05 MED ORDER — HEPARIN SODIUM (PORCINE) 5000 UNIT/ML IJ SOLN
5000.0000 [IU] | Freq: Three times a day (TID) | INTRAMUSCULAR | Status: DC
  Administered 2019-12-05 – 2019-12-06 (×4): 5000 [IU] via SUBCUTANEOUS
  Filled 2019-12-05 (×4): qty 1

## 2019-12-05 MED ORDER — HYDROMORPHONE HCL 1 MG/ML IJ SOLN: 1.0000 mg | INTRAMUSCULAR | Status: DC | PRN

## 2019-12-05 MED ORDER — POLYETHYLENE GLYCOL 3350 17 G PO PACK: 17.0000 g | PACK | Freq: Every day | ORAL | 0 refills | Status: DC | PRN

## 2019-12-05 MED ORDER — INSULIN ASPART 100 UNIT/ML ~~LOC~~ SOLN
0.0000 [IU] | Freq: Three times a day (TID) | SUBCUTANEOUS | Status: DC
  Administered 2019-12-05: 8 [IU] via SUBCUTANEOUS
  Administered 2019-12-06: 5 [IU] via SUBCUTANEOUS

## 2019-12-05 MED ORDER — INSULIN ASPART 100 UNIT/ML ~~LOC~~ SOLN: 0.0000 [IU] | Freq: Every day | SUBCUTANEOUS | Status: DC

## 2019-12-05 MED ORDER — OXYCODONE HCL 5 MG PO TABS: 5.0000 mg | ORAL_TABLET | Freq: Four times a day (QID) | ORAL | 0 refills | Status: DC | PRN

## 2019-12-05 NOTE — Discharge Instructions (Signed)
[facility] ° °Please arrive at least 30 min before your appointment to complete your check in paperwork.  If you are unable to arrive 30 min prior to your appointment time we may have to cancel or reschedule you. °LAPAROSCOPIC SURGERY: POST OP INSTRUCTIONS °Always review your discharge instruction sheet given to you by the facility where your surgery was performed. °IF YOU HAVE DISABILITY OR FAMILY LEAVE FORMS, YOU MUST BRING THEM TO THE OFFICE FOR PROCESSING.   °DO NOT GIVE THEM TO YOUR DOCTOR. ° °PAIN CONTROL ° °1. First take acetaminophen (Tylenol) AND/or ibuprofen (Advil) to control your pain after surgery.  Follow directions on package.  Taking acetaminophen (Tylenol) and/or ibuprofen (Advil) regularly after surgery will help to control your pain and lower the amount of prescription pain medication you may need.  You should not take more than 4,000 mg (4 grams) of acetaminophen (Tylenol) in 24 hours.  You should not take ibuprofen (Advil), aleve, motrin, naprosyn or other NSAIDS if you have a history of stomach ulcers or chronic kidney disease.  °2. A prescription for pain medication may be given to you upon discharge.  Take your pain medication as prescribed, if you still have uncontrolled pain after taking acetaminophen (Tylenol) or ibuprofen (Advil). °3. Use ice packs to help control pain. °4. If you need a refill on your pain medication, please contact your pharmacy.  They will contact our office to request authorization. Prescriptions will not be filled after 5pm or on week-ends. ° °HOME MEDICATIONS °5. Take your usually prescribed medications unless otherwise directed. ° °DIET °6. You should follow a light diet the first few days after arrival home.  Be sure to include lots of fluids daily. Avoid fatty, fried foods.  ° °CONSTIPATION °7. It is common to experience some constipation after surgery and if you are taking pain medication.  Increasing fluid intake and taking a stool  softener (such as Colace) will usually help or prevent this problem from occurring.  A mild laxative (Milk of Magnesia or Miralax) should be taken according to package instructions if there are no bowel movements after 48 hours. ° °WOUND/INCISION CARE °8. Most patients will experience some swelling and bruising in the area of the incisions.  Ice packs will help.  Swelling and bruising can take several days to resolve.  °9. Unless discharge instructions indicate otherwise, follow guidelines below  °a. STERI-STRIPS - you may remove your outer bandages 48 hours after surgery, and you may shower at that time.  You have steri-strips (small skin tapes) in place directly over the incision.  These strips should be left on the skin for 7-10 days.   °b. DERMABOND/SKIN GLUE - you may shower in 24 hours.  The glue will flake off over the next 2-3 weeks. °10. Any sutures or staples will be removed at the office during your follow-up visit. ° °ACTIVITIES °11. You may resume regular (light) daily activities beginning the next day--such as daily self-care, walking, climbing stairs--gradually increasing activities as tolerated.  You may have sexual intercourse when it is comfortable.  Refrain from any heavy lifting or straining until approved by your doctor. °a. You may drive when you are no longer taking prescription pain medication, you can comfortably wear a seatbelt, and you can safely maneuver your car and apply brakes. ° °FOLLOW-UP °12. You should see your doctor in the office for a follow-up appointment approximately 2-3 weeks after your surgery.  You should have been given your post-op/follow-up appointment when   your surgery was scheduled.  If you did not receive a post-op/follow-up appointment, make sure that you call for this appointment within a day or two after you arrive home to insure a convenient appointment time. ° °OTHER INSTRUCTIONS ° °WHEN TO CALL YOUR DOCTOR: °1. Fever over 101.0 °2. Inability to  urinate °3. Continued bleeding from incision. °4. Increased pain, redness, or drainage from the incision. °5. Increasing abdominal pain ° °The clinic staff is available to answer your questions during regular business hours.  Please don’t hesitate to call and ask to speak to one of the nurses for clinical concerns.  If you have a medical emergency, go to the nearest emergency room or call 911.  A surgeon from [facility] is always on call at the hospital. °[address], [address], [place], [place]  [address] ? [address] [address], [place], [place]   [address] °[phone] ? [phone] ? FAX [phone] ° ° ° °

## 2019-12-05 NOTE — Progress Notes (Signed)
Nursing Note 12/05/2019 0004   Pt belongings of cellphone, charger, glasses sent with pt to her room on [facility]C from [facility].

## 2019-12-05 NOTE — Progress Notes (Signed)
[facility] Progress Note  1 Day Post-Op  Subjective: CC-  Abdomen sore but pain well controlled. Denies n/v. Tolerating liquids. No flatus or BM. Denies abdominal bloating. Has not gotten OOB yet since surgery. Urinating without issues. States that she knows to do low volume PD for a few days after surgery. She had PD cath manipulated once before laparoscopically and had issues because she was not told to avoid high volume PD.  Objective: Vital signs in last 24 hours: Temp:  [97.4 F (36.3 C)-98.9 F (37.2 C)] 97.4 F (36.3 C) (08/10 0512) Pulse Rate:  [66-81] 66 (08/10 0512) Resp:  [10-23] 14 (08/10 0512) BP: (129-166)/(74-97) 133/74 (08/10 0512) SpO2:  [99 %-100 %] 100 % (08/10 0512) Weight:  [81 kg-81.6 kg] 81 kg (08/10 0026) Last BM Date: 12/04/19  Intake/Output from previous day: 08/09 0701 - 08/10 0700 In: 980 [P.O.:480; I.V.:500] Out: 25 [Blood:25] Intake/Output this shift: No intake/output data recorded.  PE: Gen:  Alert, NAD, pleasant HEENT: EOM's intact, pupils equal and round Pulm: rate and effort normal Abd: Soft, NT/ND, +BS, lap incisions with C/D/I dressings Psych: A&Ox4  Skin: no rashes noted, warm and dry  Lab Results:  Recent Labs    12/04/19 1748  WBC 5.3  HGB 11.2*  HCT 35.0*  PLT 312   BMET Recent Labs    12/04/19 1748  NA 134*  K 3.8  CL 94*  CO2 25  GLUCOSE 174*  BUN 49*  CREATININE 16.11*  CALCIUM 8.0*   PT/INR No results for input(s): LABPROT, INR in the last 72 hours. CMP     Component Value Date/Time   NA 134 (L) 12/04/2019 1748   NA 131 (L) 08/05/2017 1622   NA 136 12/25/2013 1709   K 3.8 12/04/2019 1748   K 3.6 12/25/2013 1709   CL 94 (L) 12/04/2019 1748   CL 104 12/25/2013 1709   CO2 25 12/04/2019 1748   CO2 24 12/25/2013 1709   GLUCOSE 174 (H) 12/04/2019 1748   GLUCOSE 113 (H) 12/25/2013 1709   BUN 49 (H) 12/04/2019 1748   BUN 36 (H) 08/05/2017 1622   BUN 10 12/25/2013 1709   CREATININE 16.11 (H)  12/04/2019 1748   CREATININE 0.59 (L) 12/25/2013 1709   CALCIUM 8.0 (L) 12/04/2019 1748   CALCIUM 8.7 12/25/2013 1709   PROT 5.8 (L) 12/04/2019 1748   PROT 6.3 08/05/2017 1622   PROT 7.6 12/25/2013 1709   ALBUMIN 3.0 (L) 12/04/2019 1748   ALBUMIN 3.2 (L) 08/05/2017 1622   ALBUMIN 3.3 (L) 12/25/2013 1709   AST 9 (L) 12/04/2019 1748   AST 13 (L) 12/25/2013 1709   ALT 8 12/04/2019 1748   ALT 14 12/25/2013 1709   ALKPHOS 106 12/04/2019 1748   ALKPHOS 65 12/25/2013 1709   BILITOT 0.3 12/04/2019 1748   BILITOT <0.2 08/05/2017 1622   BILITOT 0.2 12/25/2013 1709   GFRNONAA 3 (L) 12/04/2019 1748   GFRNONAA >60 12/25/2013 1709   GFRAA 3 (L) 12/04/2019 1748   GFRAA >60 12/25/2013 1709   Lipase     Component Value Date/Time   LIPASE 30 12/04/2019 1748       Studies/Results: CT ABDOMEN PELVIS WO CONTRAST  Result Date: 12/04/2019 CLINICAL DATA:  Abdominal pain and nausea and vomiting for 2 weeks. EXAM: CT ABDOMEN AND PELVIS WITHOUT CONTRAST TECHNIQUE: Multidetector CT imaging of the abdomen and pelvis was performed following the standard protocol without IV contrast. COMPARISON:  11/25/2018 FINDINGS: Lower chest: No acute findings. Hepatobiliary:  No mass visualized on this unenhanced exam. Gallbladder is unremarkable. No evidence of biliary ductal dilatation. Pancreas: No mass or inflammatory process visualized on this unenhanced exam. Spleen:  Within normal limits in size. Adrenals/Urinary tract: No evidence of urolithiasis or hydronephrosis. Unremarkable unopacified urinary bladder. Stomach/Bowel: Findings of acute appendicitis are seen as follows: Appendix: Location- Standard Diameter-15 mm Appendicolith- Present Mucosal hyper-enhancement-n/a Extraluminal Gas- Absent Periappendiceal Collection- None Vascular/Lymphatic: No pathologically enlarged lymph nodes identified. No evidence of abdominal aortic aneurysm. Reproductive:  No mass or other significant abnormality. Other: A small amount of  free intraperitoneal air and intraperitoneal fluid are seen which is attributable to peritoneal dialysis catheter, which is seen with distal tip in the upper pelvis. Musculoskeletal:  No suspicious bone lesions identified. IMPRESSION: Positive for acute appendicitis. Small amount of free intraperitoneal air and intraperitoneal fluid, attributable to peritoneal dialysis catheter. These results will be called to the ordering clinician or representative by the Radiologist Assistant, and communication documented in the PACS or [employer]. Electronically Signed   By: Marlaine Hind M.D.   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 Georgette Dover - POD#1  ID - zosyn 8/9 FEN - Renal/CM diet VTE - SCDs, ok for lovenox from surgical standpoint Foley - none Follow up - [place] pending. Diet as tolerated. Add colace, miralax PRN. Needs to mobilize. If pain well controlled and tolerating at least liquids, ok for discharge today from surgical standpoint. She does not need any more antibiotics. Discharge instructions and follow up info on AVS. I will send rx for oxycodone to her pharmacy. Patient knows to perform low volume PD for a few days postop.   LOS: 1 day    Wellington Hampshire, [facility] 12/05/2019, 8:27 AM Please see Amion for pager number during day hours 7:00am-4:30pm

## 2019-12-05 NOTE — Progress Notes (Signed)
PROGRESS NOTE    Kelly Huff  TTS:[id number] DOB: Dec 25, 1985 DOA: 12/04/2019 PCP: Rubie Maid, MD   Brief Narrative: Kelly Huff is a 34 y.o. female with history of diabetes mellitus type 1, HUS, ESRD on peritoneal dialysis.  Patient presented secondary to abdominal pain was found to have acute appendicitis.  Patient underwent laparoscopic appendectomy on 8/9.   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 Doing well post-op -General surgery recommendations: no antibiotics, stable for discharge  ESRD on PD Nephrology consulted. -Nephrology recommendations: plan for low volume tomorrow and if tolerates, can possibly discharge in AM (8/11) but may require multiple days.  Diabetes mellitus, type 1 Diabetic retinopathy Patient's last hemoglobin A1C of 8.7%. On Lantus and Novolog as an outpatient as an outpatient. Fasting CBG of 303 this morning. Complicated by appendicitis -Continue Lantus 15 units, SSI. May need to increase SSI scale -Add Novolog 7 units TID with meals  Chronic anemia In setting of chronic kidney disease.  Baseline hemoglobin of 11 which is stable.  Hyponatremia Mild. Manage with PD  HUS Patient follows with Dr. Rogue Bussing as an outpatient.  Essential hypertension Patient is on amlodipine, clonidine, metoprolol -Continue amlodipine, clonidine, metoprolol   DVT prophylaxis: Heparin subq Code Status:   Code Status: Full Code Family Communication: None at bedside Disposition Plan: Discharge home in 1-3 days pending nephrology recommendations for PD   Consultants:   General surgery  Nephrology  Procedures:   LAPAROSCOPIC  APPENDECTOMY (12/04/2019)  Antimicrobials:  Zosyn IV    Subjective: No abdominal pain.  Objective: Vitals:   12/04/19 2335 12/05/19 0026 12/05/19 0512 12/05/19 0856  BP: 136/88 (!) 144/89 133/74 (!) 145/82  Pulse: 70 67 66 73  Resp: 14 13 14    Temp: 97.9 F (36.6 C) 97.8 F (36.6 C) (!) 97.4 F (36.3 C)   TempSrc:  Axillary Oral   SpO2: 100% 100% 100%   Weight:  81 kg    Height:  5\' 7"  (1.702 m)      Intake/Output Summary (Last 24 hours) at 12/05/2019 1124 Last data filed at 12/05/2019 0515 Gross per 24 hour  Intake 980 ml  Output 25 ml  Net 955 ml   Filed Weights   12/04/19 1732 12/05/19 0026  Weight: 81.6 kg 81 kg    Examination:  General exam: Appears calm and comfortable Respiratory system: Clear to auscultation. Respiratory effort normal. Cardiovascular system: S1 & S2 heard, RRR. No murmurs, rubs, gallops or clicks. Gastrointestinal system: Abdomen is nondistended, soft and nontender. No organomegaly or masses felt. Normal bowel sounds heard. Central nervous system: Alert and oriented. No focal neurological deficits. Musculoskeletal: No edema. No calf tenderness Skin: No cyanosis. No rashes Psychiatry: Judgement and insight appear normal. Mood & affect appropriate.     Data Reviewed: I have personally reviewed following labs and imaging studies  CBC Lab Results  Component Value Date   WBC 8.2 12/05/2019   RBC 3.81 (L) 12/05/2019   HGB 10.3 (L) 12/05/2019   HCT 32.1 (L) 12/05/2019   MCV 84.3 12/05/2019   MCH 27.0 12/05/2019   PLT 306 12/05/2019   MCHC 32.1 12/05/2019   RDW 13.7 12/05/2019   LYMPHSABS 1.2 11/08/2019   MONOABS  0.3 11/08/2019   EOSABS 0.2 11/08/2019   BASOSABS 0.1 93/81/8299     Last metabolic panel Lab Results  Component Value Date   NA 133 (L) 12/05/2019   K 4.3 12/05/2019   CL 95 (L) 12/05/2019   CO2 22 12/05/2019   BUN 54 (H) 12/05/2019   CREATININE 16.70 (H) 12/05/2019   GLUCOSE 279 (H) 12/05/2019   GFRNONAA 2 (L)  12/05/2019   GFRAA 3 (L) 12/05/2019   CALCIUM 8.1 (L) 12/05/2019   PHOS 6.8 (H) 12/13/2018   PROT 5.8 (L) 12/04/2019   ALBUMIN 3.0 (L) 12/04/2019   LABGLOB 3.1 08/05/2017   AGRATIO 1.0 (L) 08/05/2017   BILITOT 0.3 12/04/2019   ALKPHOS 106 12/04/2019   AST 9 (L) 12/04/2019   ALT 8 12/04/2019   ANIONGAP 16 (H) 12/05/2019    CBG (last 3)  Recent Labs    12/04/19 2242 12/05/19 0622 12/05/19 1121  GLUCAP 297* 303* 325*     GFR: Estimated Creatinine Clearance: 5.2 mL/min (A) (by C-G formula based on SCr of 16.7 mg/dL (H)).  Coagulation Profile: No results for input(s): INR, PROTIME in the last 168 hours.  Recent Results (from the past 240 hour(s))  SARS Coronavirus 2 by RT PCR (hospital order, performed in [facility] hospital lab) Nasopharyngeal Nasopharyngeal Swab     Status: None   Collection Time: 12/04/19  7:32 PM   Specimen: Nasopharyngeal Swab  Result Value Ref Range Status   SARS Coronavirus 2 NEGATIVE NEGATIVE Final    Comment: (NOTE) SARS-CoV-2 target nucleic acids are NOT DETECTED.  The SARS-CoV-2 RNA is generally detectable in upper and lower respiratory specimens during the acute phase of infection. The lowest concentration of SARS-CoV-2 viral copies this assay can detect is 250 copies / mL. A negative result does not preclude SARS-CoV-2 infection and should not be used as the sole basis for treatment or other patient management decisions.  A negative result may occur with improper specimen collection / handling, submission of specimen other than nasopharyngeal swab, presence of viral mutation(s) within the areas targeted by this assay, and inadequate number of viral copies (<250 copies / mL). A negative result must be combined with clinical observations, patient history, and epidemiological information.  Fact Sheet for Patients:   StrictlyIdeas.no  Fact Sheet for Healthcare  Providers: BankingDealers.co.za  This test is not yet approved or  cleared by the [place] FDA and has been authorized for detection and/or diagnosis of SARS-CoV-2 by FDA under an Emergency Use Authorization (EUA).  This EUA will remain in effect (meaning this test can be used) for the duration of the COVID-19 declaration under Section 564(b)(1) of the Act, 21 U.S.C. section 360bbb-3(b)(1), unless the authorization is terminated or revoked sooner.  Performed at [facility], [place] [address], [place], [place] [address]         Radiology Studies: CT ABDOMEN PELVIS WO CONTRAST  Result Date: 12/04/2019 CLINICAL DATA:  Abdominal pain and nausea and vomiting for 2 weeks. EXAM: CT ABDOMEN AND PELVIS WITHOUT CONTRAST TECHNIQUE: Multidetector CT imaging of the abdomen and pelvis was performed following the standard protocol without IV contrast. COMPARISON:  11/25/2018 FINDINGS: Lower chest: No acute findings. Hepatobiliary: No mass visualized on this unenhanced exam. Gallbladder is unremarkable. No evidence of biliary ductal dilatation. Pancreas: No mass or inflammatory process visualized on this unenhanced exam. Spleen:  Within normal limits in size. Adrenals/Urinary tract: No evidence of urolithiasis or hydronephrosis. Unremarkable unopacified urinary bladder. Stomach/Bowel: Findings of acute appendicitis  are seen as follows: Appendix: Location- Standard Diameter-15 mm Appendicolith- Present Mucosal hyper-enhancement-n/a Extraluminal Gas- Absent Periappendiceal Collection- None Vascular/Lymphatic: No pathologically enlarged lymph nodes identified. No evidence of abdominal aortic aneurysm. Reproductive:  No mass or other significant abnormality. Other: A small amount of free intraperitoneal air and intraperitoneal fluid are seen which is attributable to peritoneal dialysis catheter, which is seen with distal tip in the upper pelvis. Musculoskeletal:  No suspicious bone  lesions identified. IMPRESSION: Positive for acute appendicitis. Small amount of free intraperitoneal air and intraperitoneal fluid, attributable to peritoneal dialysis catheter. These results will be called to the ordering clinician or representative by the Radiologist Assistant, and communication documented in the PACS or [employer]. Electronically Signed   By: Marlaine Hind M.D.   On: 12/04/2019 14:27        Scheduled Meds: . amLODipine  10 mg Oral QPM  . calcium acetate  1,334 mg Oral TID WC  . [START ON 12/10/2019] cloNIDine  0.1 mg Transdermal Q Sun  . docusate sodium  100 mg Oral BID  . gentamicin cream  1 application Topical Daily  . glucagon (human recombinant)      . insulin aspart  0-9 Units Subcutaneous TID WC  . insulin glargine  15 Units Subcutaneous BID  . metoprolol succinate  100 mg Oral BID  . sodium chloride flush  3 mL Intravenous Once   Continuous Infusions: . [START ON 12/06/2019] dialysis solution 1.5% low-MG/low-CA       LOS: 1 day     Cordelia Poche, MD [facility] 12/05/2019, 11:24 AM  If 7PM-7AM, please contact night-coverage [email or web address]

## 2019-12-05 NOTE — Consult Note (Signed)
[facility] Renal Consultation Note    Indication for Consultation:  Management of ESRD/hemodialysis, anemia, hypertension/volume, and secondary hyperparathyroidism. PCP:  HPI: Kelly Huff is a 34 y.o. female with ESRD, HTN, and DM who was admitted with acute appendicitis.  Pt had severe R sided abdominal pain with N/V/D on 8/7-8/8. She initially felt that may have been related to her PD. Afebrile, but felt ill. She called her PD office on 8/9 and was arranged to have abdominal CT. She did feel a little better at that time, abdominal improved. CT showed acute appendicitis and she was directed to the ED.   In the ED - labs showed K 3.8, Ca 8, WBC 5.3, Hgb 11.2. Surgery consulted and took her for laparoscopic appendectomy last night.  Today - she feels ok, her abdomen is sore but not acutely painful. She denies fever, CP, dyspnea, N/V, or diarrhea.  She does CCPD at home. Dr. Marval Regal is her primary nephrologist. She feels very strongly against changing to hemodialysis for a short period - did not tolerate in the past. She is also wanting to leave the hospital as soon as possible as her child is started Kindergarten today and she missed it.   Past Medical History:  Diagnosis Date  . Chronic kidney disease   . Diabetes mellitus without complication ([place])   . History of pre-eclampsia 2016   mild  . Hypertension    Past Surgical History:  Procedure Laterality Date  . CESAREAN SECTION    . DILATION AND CURETTAGE OF UTERUS     x 4  . IR FLUORO GUIDE CV LINE RIGHT  12/05/2018  . IR FLUORO GUIDE CV LINE RIGHT  12/13/2018  . IR REMOVAL TUN CV CATH W/O FL  02/22/2019  . IR US GUIDE VASC ACCESS RIGHT  12/05/2018  . IR US GUIDE VASC ACCESS RIGHT  12/13/2018  . LAPAROSCOPIC APPENDECTOMY N/A 12/04/2019   Procedure: APPENDECTOMY LAPAROSCOPIC;  Surgeon: Donnie Mesa, MD;  Location: [facility] OR;  Service: General;  Laterality: N/A;   Family History  Problem Relation Age of Onset  .  Hypertension Mother   . Cancer Mother        Uterine vs cervical (pt unsure),   . Schizophrenia Brother   . Breast cancer Neg Hx    Social History:  reports that she has never smoked. She has never used smokeless tobacco. She reports that she does not drink alcohol and does not use drugs.  ROS: As per HPI otherwise negative.  Physical Exam: Vitals:   12/04/19 2335 12/05/19 0026 12/05/19 0512 12/05/19 0856  BP: 136/88 (!) 144/89 133/74 (!) 145/82  Pulse: 70 67 66 73  Resp: 14 13 14    Temp: 97.9 F (36.6 C) 97.8 F (36.6 C) (!) 97.4 F (36.3 C)   TempSrc:  Axillary Oral   SpO2: 100% 100% 100%   Weight:  81 kg    Height:  5\' 7"  (1.702 m)       General: Well developed, well nourished, in no acute distress. Head: Normocephalic, atraumatic, sclera non-icteric, mucus membranes are moist. Neck: Supple without lymphadenopathy/masses.  Lungs: Clear bilaterally to auscultation without wheezes, rales, or rhonchi. Breathing is unlabored. Heart: RRR with normal S1, S2. No murmurs, rubs, or gallops appreciated. Abdomen: Soft, non-tender, non-distended with normoactive bowel sounds. 3 post hole incisions on mid and L abdomen, PD cath in R abdomen without erythema or tenderness. Musculoskeletal:  Strength and tone appear normal for age. Lower extremities: No edema or  ischemic changes, no open wounds. Neuro: Alert and oriented X 3. Moves all extremities spontaneously. Psych:  Responds to questions appropriately with a normal affect. Dialysis Access: PD cath R abdomen  Allergies  Allergen Reactions  . Morphine Itching and Other (See Comments)    Hallucination  . Sulfamethoxazole-Trimethoprim Hives  . Trulicity [Dulaglutide] Other (See Comments)    She is DM Type 1. Was prescribed but damaged her kidneys   Prior to Admission medications   Medication Sig Start Date End Date Taking? Authorizing Provider  amLODipine (NORVASC) 10 MG tablet Take 10 mg by mouth every evening.  11/06/18  Yes  [provider]  calcium acetate (PHOSLO) 667 MG tablet Take 1,334 mg by mouth 3 (three) times daily with meals. 11/05/18  Yes [provider]  cloNIDine (CATAPRES - DOSED IN MG/24 HR) 0.1 mg/24hr patch Place 0.1 mg onto the skin See admin instructions. Apply patch on every Sunday 10/10/18  Yes [provider]  cyclobenzaprine (FLEXERIL) 10 MG tablet Take 10 mg by mouth at bedtime as needed for muscle spasms.    Yes [provider]  docusate sodium (COLACE) 100 MG capsule Take 100 mg by mouth daily as needed for mild constipation.    Yes [provider]  gentamicin cream (GARAMYCIN) 0.1 % Apply 1 application topically See admin instructions. Apply small amount to catheter exit site once daily 09/07/18  Yes [provider]  insulin aspart (NOVOLOG) 100 UNIT/ML injection Inject 7 Units into the skin 3 (three) times daily before meals.   Yes [provider]  insulin glargine (LANTUS) 100 UNIT/ML injection Inject 15 Units into the skin daily.   Yes [provider]  metoprolol succinate (TOPROL-XL) 100 MG 24 hr tablet Take 100 mg by mouth 2 (two) times daily. 10/18/18  Yes [provider]  acetaminophen (TYLENOL) 325 MG tablet Take 2 tablets (650 mg total) by mouth every 4 (four) hours as needed for mild pain or fever. 12/05/19   Meuth, Brooke A, PA-C  buPROPion (WELLBUTRIN XL) 150 MG 24 hr tablet TAKE 1 TABLET BY MOUTH EVERY DAY Patient not taking: Reported on 12/04/2019 05/05/19   Rubie Maid, MD  oxyCODONE (OXY IR/ROXICODONE) 5 MG immediate release tablet Take 1 tablet (5 mg total) by mouth every 6 (six) hours as needed for severe pain. 12/05/19   Meuth, Brooke A, PA-C  polyethylene glycol (MIRALAX / GLYCOLAX) 17 g packet Take 17 g by mouth daily as needed for mild constipation. 12/05/19   Meuth, Blaine Hamper, PA-C   Current Facility-Administered Medications  Medication Dose Route Frequency Provider Last Rate Last Admin  .  acetaminophen (TYLENOL) tablet 650 mg  650 mg Oral Q4H PRN Donnie Mesa, MD      . amLODipine (NORVASC) tablet 10 mg  10 mg Oral QPM Donnie Mesa, MD      . calcium acetate (PHOSLO) capsule 1,334 mg  1,334 mg Oral TID WC Donnie Mesa, MD   1,334 mg at 12/05/19 0857  . [START ON 12/10/2019] cloNIDine (CATAPRES - Dosed in mg/24 hr) patch 0.1 mg  0.1 mg Transdermal Q Doreen Beam, MD      . cyclobenzaprine (FLEXERIL) tablet 10 mg  10 mg Oral QHS PRN Donnie Mesa, MD      . docusate sodium (COLACE) capsule 100 mg  100 mg Oral BID Meuth, Brooke A, PA-C   100 mg at 12/05/19 0856  . gentamicin cream (GARAMYCIN) 0.1 % 1 application  1 application Topical Daily Donnie Mesa,  MD   1 application at 46/96/29 0858  . glucagon (human recombinant) (GLUCAGEN) 1 MG injection           . HYDROmorphone (DILAUDID) 1 MG/ML injection           . HYDROmorphone (DILAUDID) injection 1 mg  1 mg Intravenous Q4H PRN Meuth, Brooke A, PA-C      . insulin aspart (novoLOG) injection 0-9 Units  0-9 Units Subcutaneous TID WC Rise Patience, MD   7 Units at 12/05/19 [phone]  . insulin glargine (LANTUS) injection 15 Units  15 Units Subcutaneous BID Rise Patience, MD   15 Units at 12/05/19 0857  . metoprolol succinate (TOPROL-XL) 24 hr tablet 100 mg  100 mg Oral BID Donnie Mesa, MD   100 mg at 12/05/19 0856  . ondansetron (ZOFRAN) tablet 4 mg  4 mg Oral Q6H PRN Rise Patience, MD       Or  . ondansetron [facility]) injection 4 mg  4 mg Intravenous Q6H PRN Rise Patience, MD      . oxyCODONE (Oxy IR/ROXICODONE) immediate release tablet 5-10 mg  5-10 mg Oral Q4H PRN Meuth, Brooke A, PA-C      . polyethylene glycol (MIRALAX / GLYCOLAX) packet 17 g  17 g Oral Daily PRN Meuth, Brooke A, PA-C      . sodium chloride flush (NS) 0.9 % injection 3 mL  3 mL Intravenous Once Rise Patience, MD       Facility-Administered Medications Ordered in Other Encounters  Medication Dose Route Frequency Provider  Last Rate Last Admin  . sodium chloride flush (NS) 0.9 % injection 10 mL  10 mL Intravenous PRN Cammie Sickle, MD       Labs: Basic Metabolic Panel: Recent Labs  Lab 12/04/19 1748 12/05/19 0919  NA 134* 133*  K 3.8 4.3  CL 94* 95*  CO2 25 22  GLUCOSE 174* 279*  BUN 49* 54*  CREATININE 16.11* 16.70*  CALCIUM 8.0* 8.1*   Liver Function Tests: Recent Labs  Lab 12/04/19 1748  AST 9*  ALT 8  ALKPHOS 106  BILITOT 0.3  PROT 5.8*  ALBUMIN 3.0*   Recent Labs  Lab 12/04/19 1748  LIPASE 30   CBC: Recent Labs  Lab 12/04/19 1748 12/05/19 0919  WBC 5.3 8.2  HGB 11.2* 10.3*  HCT 35.0* 32.1*  MCV 86.4 84.3  PLT 312 306   Studies/Results: CT ABDOMEN PELVIS WO CONTRAST  Result Date: 12/04/2019 CLINICAL DATA:  Abdominal pain and nausea and vomiting for 2 weeks. EXAM: CT ABDOMEN AND PELVIS WITHOUT CONTRAST TECHNIQUE: Multidetector CT imaging of the abdomen and pelvis was performed following the standard protocol without IV contrast. COMPARISON:  11/25/2018 FINDINGS: Lower chest: No acute findings. Hepatobiliary: No mass visualized on this unenhanced exam. Gallbladder is unremarkable. No evidence of biliary ductal dilatation. Pancreas: No mass or inflammatory process visualized on this unenhanced exam. Spleen:  Within normal limits in size. Adrenals/Urinary tract: No evidence of urolithiasis or hydronephrosis. Unremarkable unopacified urinary bladder. Stomach/Bowel: Findings of acute appendicitis are seen as follows: Appendix: Location- Standard Diameter-15 mm Appendicolith- Present Mucosal hyper-enhancement-n/a Extraluminal Gas- Absent Periappendiceal Collection- None Vascular/Lymphatic: No pathologically enlarged lymph nodes identified. No evidence of abdominal aortic aneurysm. Reproductive:  No mass or other significant abnormality. Other: A small amount of free intraperitoneal air and intraperitoneal fluid are seen which is attributable to peritoneal dialysis catheter, which is  seen with distal tip in the upper pelvis. Musculoskeletal:  No suspicious bone  lesions identified. IMPRESSION: Positive for acute appendicitis. Small amount of free intraperitoneal air and intraperitoneal fluid, attributable to peritoneal dialysis catheter. These results will be called to the ordering clinician or representative by the Radiologist Assistant, and communication documented in the PACS or [employer]. Electronically Signed   By: Marlaine Hind M.D.   On: 12/04/2019 14:27   Dialysis Orders:  CCPD - Dr. Marval Regal is her nephrologist 6 exchanges, 2.2L, dwell time 1:30hr, typically 1.5% dextrose  Assessment/Plan: 1.  Acute appendicitis: S/p laparoscopic appendectomy 8/9. 2.  ESRD: Refuses short-term hemodialysis. Per surgery - it would be ok to do low volume PD until incisions heal. Spoke to her usual nephrologist who recommends to following: 1. No PD tonight 2. Start tomorrow morning with SUPINE low volume PD -- 1L fill volume x 10 exchanges. 1.5% dextrose. 3. If tolerates, then she can go home with close follow up and can slowly increase fill volume but with plan for longer PD with low volume SUPINE PD for few weeks. 3.  Hypertension/volume: BP stable, no edema. 4.  Anemia: Hgb 10.3 - may need ESA if Hgb goes lower. 5.  Metabolic bone disease: Ca ok, Phos pending. Continue home meds. 6.  Nutrition: Alb low - add Nepro while here. 7.  DM: Insulin per primary.  Veneta Penton, PA-C 12/05/2019, 10:29 AM  [employer]

## 2019-12-05 NOTE — Anesthesia Postprocedure Evaluation (Signed)
Anesthesia Post Note  Patient: Kelly Huff  Procedure(s) Performed: APPENDECTOMY LAPAROSCOPIC (N/A Abdomen)     Patient location during evaluation: PACU Anesthesia Type: General Level of consciousness: awake and alert Pain management: pain level controlled Vital Signs Assessment: post-procedure vital signs reviewed and stable Respiratory status: spontaneous breathing, nonlabored ventilation, respiratory function stable and patient connected to nasal cannula oxygen Cardiovascular status: blood pressure returned to baseline and stable Postop Assessment: no apparent nausea or vomiting Anesthetic complications: no   No complications documented.  Last Vitals:  Vitals:   12/05/19 0026 12/05/19 0512  BP: (!) 144/89 133/74  Pulse: 67 66  Resp: 13 14  Temp: 36.6 C (!) 36.3 C  SpO2: 100% 100%    Last Pain:  Vitals:   12/05/19 0512  TempSrc: Oral  PainSc:                  Karyl Kinnier Kanchan Gal

## 2019-12-06 LAB — SURGICAL PATHOLOGY

## 2019-12-06 LAB — GLUCOSE, CAPILLARY
Glucose-Capillary: 68 mg/dL — ABNORMAL LOW (ref 70–99)
Glucose-Capillary: 125 mg/dL — ABNORMAL HIGH (ref 70–99)
Glucose-Capillary: 228 mg/dL — ABNORMAL HIGH (ref 70–99)
Glucose-Capillary: 191 mg/dL — ABNORMAL HIGH (ref 70–99)

## 2019-12-06 MED ORDER — NEPRO/CARBSTEADY PO LIQD: 237.0000 mL | Freq: Two times a day (BID) | ORAL | Status: DC

## 2019-12-06 NOTE — Discharge Summary (Signed)
Physician Discharge Summary  Kelly Huff FMB:[id number] DOB: Mar 18, 1986 DOA: 12/04/2019  PCP: Rubie Maid, MD  Admit date: 12/04/2019 Discharge date: 12/06/2019  Admitted From: home Discharge disposition: home   Recommendations for Outpatient Follow-Up:   1. 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  Diet recommendation: Low sodium, heart healthy.  Carbohydrate-modified.    Wound care: None.  Code status: Full.   History of Present Illness:   Kelly Huff is a 34 y.o. 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 Abdominal pain is diffuse. Patient states she has had some abdominal pain about 2 weeks ago which resolved without any intervention. Denies fever chills chest pain or shortness of breath. Patient had gone to her PCP would advise getting CT abdomen which showed features concerning for acute appendicitis and was advised to come to the ER.  ED Course: In the ER general surgery was consulted and patient was taken to the OR for urgent surgery. And I examined the patient after the surgery at this time. Labs are largely unremarkable except for mild anemia. Covid test was negative.   Hospital Course by Problem:   Acute appendicitis Patient underwent laparoscopic appendectomy shortly after admission. Doing well post-op -General surgery recommendations: no antibiotics, stable for discharge  Diabetes mellitus, type 1 Diabetic retinopathy -resume home meds  Chronic anemia In setting of chronic  kidney disease.  Baseline hemoglobin of 11 which is stable.  Hyponatremia Mild. Manage with PD  HUS Patient follows with Dr. Rogue Bussing as an outpatient.  Essential hypertension Patient is on amlodipine, clonidine, metoprolol -Continue amlodipine, clonidine, metoprolol  ESRD on PD Per renal - Dry tonight and throughout tomorrow day. - Tomorrow night, to start CCPD with 1L fill volume, 1 hour dwell x 10 exchanges. - Had spoke to her outpatient PD RN earlier today with plan -- she will contact the patient directly about gradual adjustments to PD prescription.    Medical Consultants:    [facility]   Discharge Exam:   Vitals:   12/06/19 0630 12/06/19 1207  BP: 140/85 139/82  Pulse: 68 70  Resp: 17 16  Temp: 98.4 F (36.9 C) 98.7 F (37.1 C)  SpO2: 100% 99%   Vitals:   12/06/19 0008 12/06/19 0534 12/06/19 0630 12/06/19 1207  BP: 134/78 133/84 140/85 139/82  Pulse: 76 70 68 70  Resp: 18 18 17 16   Temp: 98 F (36.7 C) 98.1 F (36.7 C) 98.4 F (36.9 C) 98.7 F (37.1 C)  TempSrc: Oral Oral Oral Oral  SpO2: 100% 98% 100% 99%  Weight:  85.2 kg 84.7 kg   Height:        General exam: Appears calm and comfortable.   The results of significant diagnostics from this hospitalization (including imaging, microbiology, ancillary and laboratory) are listed below for reference.     Procedures and Diagnostic Studies:   CT ABDOMEN PELVIS WO CONTRAST  Result Date: 12/04/2019 CLINICAL DATA:  Abdominal pain and nausea and vomiting for 2 weeks. EXAM: CT ABDOMEN AND PELVIS WITHOUT  CONTRAST TECHNIQUE: Multidetector CT imaging of the abdomen and pelvis was performed following the standard protocol without IV contrast. COMPARISON:  11/25/2018 FINDINGS: Lower chest: No acute findings. Hepatobiliary: No mass visualized on this unenhanced exam. Gallbladder is unremarkable. No evidence of biliary ductal dilatation. Pancreas: No mass or inflammatory process visualized on this unenhanced  exam. Spleen:  Within normal limits in size. Adrenals/Urinary tract: No evidence of urolithiasis or hydronephrosis. Unremarkable unopacified urinary bladder. Stomach/Bowel: Findings of acute appendicitis are seen as follows: Appendix: Location- Standard Diameter-15 mm Appendicolith- Present Mucosal hyper-enhancement-n/a Extraluminal Gas- Absent Periappendiceal Collection- None Vascular/Lymphatic: No pathologically enlarged lymph nodes identified. No evidence of abdominal aortic aneurysm. Reproductive:  No mass or other significant abnormality. Other: A small amount of free intraperitoneal air and intraperitoneal fluid are seen which is attributable to peritoneal dialysis catheter, which is seen with distal tip in the upper pelvis. Musculoskeletal:  No suspicious bone lesions identified. IMPRESSION: Positive for acute appendicitis. Small amount of free intraperitoneal air and intraperitoneal fluid, attributable to peritoneal dialysis catheter. These results will be called to the ordering clinician or representative by the Radiologist Assistant, and communication documented in the PACS or [employer]. Electronically Signed   By: Marlaine Hind M.D.   On: 12/04/2019 14:27     Labs:   Basic Metabolic Panel: Recent Labs  Lab 12/04/19 1748 12/05/19 0919  NA 134* 133*  K 3.8 4.3  CL 94* 95*  CO2 25 22  GLUCOSE 174* 279*  BUN 49* 54*  CREATININE 16.11* 16.70*  CALCIUM 8.0* 8.1*   GFR Estimated Creatinine Clearance: 5.3 mL/min (A) (by C-G formula based on SCr of 16.7 mg/dL (H)). Liver Function Tests: Recent Labs  Lab 12/04/19 1748  AST 9*  ALT 8  ALKPHOS 106  BILITOT 0.3  PROT 5.8*  ALBUMIN 3.0*   Recent Labs  Lab 12/04/19 1748  LIPASE 30   No results for input(s): AMMONIA in the last 168 hours. Coagulation profile No results for input(s): INR, PROTIME in the last 168 hours.  CBC: Recent Labs  Lab 12/04/19 1748 12/05/19 0919  WBC 5.3 8.2  HGB 11.2* 10.3*  HCT 35.0* 32.1*    MCV 86.4 84.3  PLT 312 306   Cardiac Enzymes: No results for input(s): CKTOTAL, CKMB, CKMBINDEX, TROPONINI in the last 168 hours. BNP: Invalid input(s): POCBNP CBG: Recent Labs  Lab 12/05/19 2153 12/06/19 0552 12/06/19 0653 12/06/19 1103 12/06/19 1614  GLUCAP 92 68* 125* 228* 191*   D-Dimer No results for input(s): DDIMER in the last 72 hours. Hgb A1c No results for input(s): HGBA1C in the last 72 hours. Lipid Profile No results for input(s): CHOL, HDL, LDLCALC, TRIG, CHOLHDL, LDLDIRECT in the last 72 hours. Thyroid function studies No results for input(s): TSH, T4TOTAL, T3FREE, THYROIDAB in the last 72 hours.  Invalid input(s): FREET3 Anemia work up No results for input(s): VITAMINB12, FOLATE, FERRITIN, TIBC, IRON, RETICCTPCT in the last 72 hours. Microbiology Recent Results (from the past 240 hour(s))  SARS Coronavirus 2 by RT PCR (hospital order, performed in [facility] hospital lab) Nasopharyngeal Nasopharyngeal Swab     Status: None   Collection Time: 12/04/19  7:32 PM   Specimen: Nasopharyngeal Swab  Result Value Ref Range Status   SARS Coronavirus 2 NEGATIVE NEGATIVE Final    Comment: (NOTE) SARS-CoV-2 target nucleic acids are NOT DETECTED.  The SARS-CoV-2 RNA is generally detectable in upper and lower respiratory specimens during the acute phase of infection. The lowest concentration of SARS-CoV-2 viral copies this  assay can detect is 250 copies / mL. A negative result does not preclude SARS-CoV-2 infection and should not be used as the sole basis for treatment or other patient management decisions.  A negative result may occur with improper specimen collection / handling, submission of specimen other than nasopharyngeal swab, presence of viral mutation(s) within the areas targeted by this assay, and inadequate number of viral copies (<250 copies / mL). A negative result must be combined with clinical observations, patient history, and epidemiological  information.  Fact Sheet for Patients:   StrictlyIdeas.no  Fact Sheet for Healthcare Providers: BankingDealers.co.za  This test is not yet approved or  cleared by the [place] FDA and has been authorized for detection and/or diagnosis of SARS-CoV-2 by FDA under an Emergency Use Authorization (EUA).  This EUA will remain in effect (meaning this test can be used) for the duration of the COVID-19 declaration under Section 564(b)(1) of the Act, 21 U.S.C. section 360bbb-3(b)(1), unless the authorization is terminated or revoked sooner.  Performed at [facility], [place] [address] [place], [place] [address]      Discharge Instructions:   Discharge Instructions    Discharge instructions   Complete by: As directed    low volume SUPINE PD for few weeks Renal diet   Increase activity slowly   Complete by: As directed    No wound care   Complete by: As directed      Allergies as of 12/06/2019      Reactions   Morphine Itching, Other (See Comments)   Hallucination   Sulfamethoxazole-trimethoprim Hives   Trulicity [dulaglutide] Other (See Comments)   She is DM Type 1. Was prescribed but damaged her kidneys      Medication List    STOP taking these medications   buPROPion 150 MG 24 hr tablet Commonly known as: WELLBUTRIN XL     TAKE these medications   acetaminophen 325 MG tablet Commonly known as: TYLENOL Take 2 tablets (650 mg total) by mouth every 4 (four) hours as needed for mild pain or fever.   amLODipine 10 MG tablet Commonly known as: NORVASC Take 10 mg by mouth every evening.   calcium acetate 667 MG tablet Commonly known as: PHOSLO Take 1,334 mg by mouth 3 (three) times daily with meals.   cloNIDine 0.1 mg/24hr patch Commonly known as: CATAPRES - Dosed in mg/24 hr Place 0.1 mg onto the skin See admin instructions. Apply patch on every Sunday   cyclobenzaprine 10 MG tablet Commonly known as:  FLEXERIL Take 10 mg by mouth at bedtime as needed for muscle spasms.   docusate sodium 100 MG capsule Commonly known as: COLACE Take 100 mg by mouth daily as needed for mild constipation.   gentamicin cream 0.1 % Commonly known as: GARAMYCIN Apply 1 application topically See admin instructions. Apply small amount to catheter exit site once daily   insulin aspart 100 UNIT/ML injection Commonly known as: novoLOG Inject 7 Units into the skin 3 (three) times daily before meals.   insulin glargine 100 UNIT/ML injection Commonly known as: LANTUS Inject 15 Units into the skin daily.   metoprolol succinate 100 MG 24 hr tablet Commonly known as: TOPROL-XL Take 100 mg by mouth 2 (two) times daily.   oxyCODONE 5 MG immediate release tablet Commonly known as: Oxy IR/ROXICODONE Take 1 tablet (5 mg total) by mouth every 6 (six) hours as needed for severe pain.   polyethylene glycol 17 g packet Commonly known as: MIRALAX /  GLYCOLAX Take 17 g by mouth daily as needed for mild constipation.       Follow-up [facility], [place]. Go on 12/26/2019.   Specialty: General Surgery Why: Your appointment is 08/31 at 9:45 am Please arrive 30 minutes prior to your appointment to check in and fill out paperwork. Bring photo ID and insurance information. Contact information: [address] [place], [place] Follow up in 1 week(s).   Specialties: Obstetrics and Gynecology, Radiology Contact information: [place] [address] [phone]                Time coordinating discharge: 35 min  Signed:  Geradine Girt DO  [facility] 12/06/2019, 4:17 PM

## 2019-12-06 NOTE — Progress Notes (Signed)
pts BS 66, pt asymptomatic, juice given, will cont to monitor.

## 2019-12-06 NOTE — Progress Notes (Signed)
[facility] Progress Note   Subjective:  Seen in room - supine, low volume PD running now. Denies abdominal pain, N/V. No dyspnea. Her external bandages are clean/dry. There is some blood in the PD effluent bag - expected s/p surgery. Will need to follow each drain -- hopefully will start to clear. Will plan to come back to her room and check in a couple of hours.   Objective Vitals:   12/05/19 1628 12/06/19 0008 12/06/19 0534 12/06/19 0630  BP: 138/87 134/78 133/84 140/85  Pulse: 75 76 70 68  Resp: 16 18 18 17   Temp: 98.3 F (36.8 C) 98 F (36.7 C) 98.1 F (36.7 C) 98.4 F (36.9 C)  TempSrc: Oral Oral Oral Oral  SpO2: 100% 100% 98% 100%  Weight:   85.2 kg 84.7 kg  Height:       Physical Exam General: Well appearing woman, NAD Heart: RRR; no murmur Lungs: CTA anteriorly Abdomen: soft, non-tender. PD cath connected. 3 lap incisions C/D/I. Extremities: No LE edema Dialysis Access:  PD cath in R abdomen.  Additional Objective Labs: Basic Metabolic Panel: Recent Labs  Lab 12/04/19 1748 12/05/19 0919  NA 134* 133*  K 3.8 4.3  CL 94* 95*  CO2 25 22  GLUCOSE 174* 279*  BUN 49* 54*  CREATININE 16.11* 16.70*  CALCIUM 8.0* 8.1*   Liver Function Tests: Recent Labs  Lab 12/04/19 1748  AST 9*  ALT 8  ALKPHOS 106  BILITOT 0.3  PROT 5.8*  ALBUMIN 3.0*   Recent Labs  Lab 12/04/19 1748  LIPASE 30   CBC: Recent Labs  Lab 12/04/19 1748 12/05/19 0919  WBC 5.3 8.2  HGB 11.2* 10.3*  HCT 35.0* 32.1*  MCV 86.4 84.3  PLT 312 306   Studies/Results: CT ABDOMEN PELVIS WO CONTRAST  Result Date: 12/04/2019 CLINICAL DATA:  Abdominal pain and nausea and vomiting for 2 weeks. EXAM: CT ABDOMEN AND PELVIS WITHOUT CONTRAST TECHNIQUE: Multidetector CT imaging of the abdomen and pelvis was performed following the standard protocol without IV contrast. COMPARISON:  11/25/2018 FINDINGS: Lower chest: No acute findings. Hepatobiliary: No mass visualized on this  unenhanced exam. Gallbladder is unremarkable. No evidence of biliary ductal dilatation. Pancreas: No mass or inflammatory process visualized on this unenhanced exam. Spleen:  Within normal limits in size. Adrenals/Urinary tract: No evidence of urolithiasis or hydronephrosis. Unremarkable unopacified urinary bladder. Stomach/Bowel: Findings of acute appendicitis are seen as follows: Appendix: Location- Standard Diameter-15 mm Appendicolith- Present Mucosal hyper-enhancement-n/a Extraluminal Gas- Absent Periappendiceal Collection- None Vascular/Lymphatic: No pathologically enlarged lymph nodes identified. No evidence of abdominal aortic aneurysm. Reproductive:  No mass or other significant abnormality. Other: A small amount of free intraperitoneal air and intraperitoneal fluid are seen which is attributable to peritoneal dialysis catheter, which is seen with distal tip in the upper pelvis. Musculoskeletal:  No suspicious bone lesions identified. IMPRESSION: Positive for acute appendicitis. Small amount of free intraperitoneal air and intraperitoneal fluid, attributable to peritoneal dialysis catheter. These results will be called to the ordering clinician or representative by the Radiologist Assistant, and communication documented in the PACS or [employer]. Electronically Signed   By: Marlaine Hind M.D.   On: 12/04/2019 14:27   Medications: . dialysis solution 1.5% low-MG/low-CA     . amLODipine  10 mg Oral QPM  . calcium acetate  1,334 mg Oral TID WC  . [START ON 12/10/2019] cloNIDine  0.1 mg Transdermal Q Sun  . docusate sodium  100 mg Oral BID  . gentamicin  cream  1 application Topical Daily  . heparin  5,000 Units Subcutaneous Q8H  . insulin aspart  0-15 Units Subcutaneous TID WC  . insulin aspart  0-5 Units Subcutaneous QHS  . insulin aspart  7 Units Subcutaneous TID WC  . insulin glargine  15 Units Subcutaneous BID  . metoprolol succinate  100 mg Oral BID  . sodium chloride flush  3 mL  Intravenous Once    Dialysis Orders: CCPD - Dr. Marval Regal is her nephrologist 6 exchanges, 2.2L, dwell time 1:30hr, typically 1.5% dextrose  Assessment/Plan: 1.  Acute appendicitis: S/p laparoscopic appendectomy 8/9. 2.  ESRD: Refuses short-term hemodialysis. Per surgery - it would be ok to do low volume PD until incisions heal. Spoke to her usual nephrologist who recommends to following:  No PD 8/9.  Start 8/10 morning with SUPINE low volume PD -- 1L fill volume x 10 exchanges. 1.5% dextrose.  If tolerates, then she can go home with close follow up and can slowly increase fill volume but with plan for longer PD with low volume SUPINE PD for few weeks.  Some bloody effluent in bag from 1st drain today - not unexpected s/p surgery - will need to follow, hopefully will lessen with each drain. She is not having pain and external incisions remain dry at this time.  She is very anxious to get home - tried to express our desire to keep her safe. 3.  Hypertension/volume: BP stable, no edema. 4.  Anemia: Hgb 10.3 - may need ESA if Hgb goes lower. 5.  Metabolic bone disease: Ca ok, Phos pending. Continue home meds. 6.  Nutrition: Alb low - add Nepro while here. 7.  DM: Insulin per primary.  Veneta Penton, PA-C 12/06/2019, 10:20 AM  [employer]

## 2019-12-06 NOTE — TOC Transition Note (Signed)
Transition of Care [facility]) - CM/SW Discharge Note   Patient Details  Name: Kelly Huff MRN: [id number] Date of Birth: [birth year]  Transition of Care [facility]) CM/SW Contact:  Pollie Friar, RN Phone Number: 12/06/2019, 4:37 PM   Clinical Narrative:    Pt discharging home with self care. Pt has transportation home.    Final next level of care: Home/Self Care Barriers to Discharge: No Barriers Identified   Patient Goals and CMS Choice        Discharge Placement                       Discharge Plan and Services                                     Social Determinants of Health (SDOH) Interventions     Readmission Risk Interventions No flowsheet data found.

## 2019-12-06 NOTE — Progress Notes (Signed)
[facility]NAL ADDENDUM NOTE:  Pt has been doing supine low flow PD all day. Effluent on most recent drain bag remains blood tinged, but no worse than this morning. She has no abdominal pain and her abdominal bandages remain dry/clean.  Will end her PD now so she can be discharged home this evening. Hospitalist informed.  We reviewed plan for PD:  - Dry tonight and throughout tomorrow day. - Tomorrow night, to start [facility]CPD with 1L fill volume, 1 hour dwell x 10 exchanges. - Had spoke to her outpatient PD RN earlier today with plan -- she will contact the patient directly about gradual adjustments to PD prescription.   We also reviewed "alarm" symptoms/signs including: Sharp abd pain, red blood or worsened blood tinge to effluent, or bleeding/leaking fluid from abdominal incisions. If these occur, she will call her on-call PD RN ASAP.  We also discussed remaining supine throughout PD and to make sure to follow the lifting guidelines given by her surgeon.  Dr. Melvia Heaps was present during the above exam and is in agreement with plan.  Veneta Penton, PA-C [employer] Pager [phone]

## 2019-12-06 NOTE — Progress Notes (Signed)
[facility] Progress Note  2 Days Post-Op  Subjective: CC-  Abdomen sore but pain well controlled. Denies n/v. Tolerating diet. Currently on PD Wants to go home today  Objective: Vital signs in last 24 hours: Temp:  [98 F (36.7 C)-98.4 F (36.9 C)] 98.4 F (36.9 C) (08/11 0630) Pulse Rate:  [68-77] 68 (08/11 0630) Resp:  [16-18] 17 (08/11 0630) BP: (133-145)/(78-87) 140/85 (08/11 0630) SpO2:  [98 %-100 %] 100 % (08/11 0630) Weight:  [84.7 kg-85.2 kg] 84.7 kg (08/11 0630) Last BM Date: 12/04/19  Intake/Output from previous day: 08/10 0701 - 08/11 0700 In: 240 [P.O.:240] Out: -  Intake/Output this shift: No intake/output data recorded.  PE: Gen:  Alert, NAD, pleasant Pulm: rate and effort normal Abd: Soft, NT/ND, +BS, lap incisions with C/D/I dressings Psych: A&Ox4  Skin: no rashes noted, warm and dry  Lab Results:  Recent Labs    12/04/19 1748 12/05/19 0919  WBC 5.3 8.2  HGB 11.2* 10.3*  HCT 35.0* 32.1*  PLT 312 306   BMET Recent Labs    12/04/19 1748 12/05/19 0919  NA 134* 133*  K 3.8 4.3  CL 94* 95*  CO2 25 22  GLUCOSE 174* 279*  BUN 49* 54*  CREATININE 16.11* 16.70*  CALCIUM 8.0* 8.1*   PT/INR No results for input(s): LABPROT, INR in the last 72 hours. CMP     Component Value Date/Time   NA 133 (L) 12/05/2019 0919   NA 131 (L) 08/05/2017 1622   NA 136 12/25/2013 1709   K 4.3 12/05/2019 0919   K 3.6 12/25/2013 1709   CL 95 (L) 12/05/2019 0919   CL 104 12/25/2013 1709   CO2 22 12/05/2019 0919   CO2 24 12/25/2013 1709   GLUCOSE 279 (H) 12/05/2019 0919   GLUCOSE 113 (H) 12/25/2013 1709   BUN 54 (H) 12/05/2019 0919   BUN 36 (H) 08/05/2017 1622   BUN 10 12/25/2013 1709   CREATININE 16.70 (H) 12/05/2019 0919   CREATININE 0.59 (L) 12/25/2013 1709   CALCIUM 8.1 (L) 12/05/2019 0919   CALCIUM 8.7 12/25/2013 1709   PROT 5.8 (L) 12/04/2019 1748   PROT 6.3 08/05/2017 1622   PROT 7.6 12/25/2013 1709   ALBUMIN 3.0 (L) 12/04/2019  1748   ALBUMIN 3.2 (L) 08/05/2017 1622   ALBUMIN 3.3 (L) 12/25/2013 1709   AST 9 (L) 12/04/2019 1748   AST 13 (L) 12/25/2013 1709   ALT 8 12/04/2019 1748   ALT 14 12/25/2013 1709   ALKPHOS 106 12/04/2019 1748   ALKPHOS 65 12/25/2013 1709   BILITOT 0.3 12/04/2019 1748   BILITOT <0.2 08/05/2017 1622   BILITOT 0.2 12/25/2013 1709   GFRNONAA 2 (L) 12/05/2019 0919   GFRNONAA >60 12/25/2013 1709   GFRAA 3 (L) 12/05/2019 0919   GFRAA >60 12/25/2013 1709   Lipase     Component Value Date/Time   LIPASE 30 12/04/2019 1748       Studies/Results: CT ABDOMEN PELVIS WO CONTRAST  Result Date: 12/04/2019 CLINICAL DATA:  Abdominal pain and nausea and vomiting for 2 weeks. EXAM: CT ABDOMEN AND PELVIS WITHOUT CONTRAST TECHNIQUE: Multidetector CT imaging of the abdomen and pelvis was performed following the standard protocol without IV contrast. COMPARISON:  11/25/2018 FINDINGS: Lower chest: No acute findings. Hepatobiliary: No mass visualized on this unenhanced exam. Gallbladder is unremarkable. No evidence of biliary ductal dilatation. Pancreas: No mass or inflammatory process visualized on this unenhanced exam. Spleen:  Within normal limits in size. Adrenals/Urinary tract: No  evidence of urolithiasis or hydronephrosis. Unremarkable unopacified urinary bladder. Stomach/Bowel: Findings of acute appendicitis are seen as follows: Appendix: Location- Standard Diameter-15 mm Appendicolith- Present Mucosal hyper-enhancement-n/a Extraluminal Gas- Absent Periappendiceal Collection- None Vascular/Lymphatic: No pathologically enlarged lymph nodes identified. No evidence of abdominal aortic aneurysm. Reproductive:  No mass or other significant abnormality. Other: A small amount of free intraperitoneal air and intraperitoneal fluid are seen which is attributable to peritoneal dialysis catheter, which is seen with distal tip in the upper pelvis. Musculoskeletal:  No suspicious bone lesions identified. IMPRESSION:  Positive for acute appendicitis. Small amount of free intraperitoneal air and intraperitoneal fluid, attributable to peritoneal dialysis catheter. These results will be called to the ordering clinician or representative by the Radiologist Assistant, and communication documented in the PACS or [employer]. Electronically Signed   By: Marlaine Hind M.D.   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 Georgette Dover - POD#2  ID - zosyn 8/9 FEN - Renal/CM diet VTE - SCDs, sq heparin Foley - none Follow up - [place] for discharge today from surgical standpoint. Discharge instructions and follow up info on AVS. Oxy rx sent to pharmacy. Continue low volume PD for a few days postop.   LOS: 2 days    Wellington Hampshire, [facility] 12/06/2019, 8:37 AM Please see Amion for pager number during day hours 7:00am-4:30pm

## 2019-12-12 ENCOUNTER — Telehealth: Payer: Self-pay | Admitting: Obstetrics and Gynecology

## 2019-12-12 NOTE — Telephone Encounter (Signed)
Patient called in stating that she hasn't received her test results back and that it has been over two weeks. Could you please advise?

## 2019-12-13 ENCOUNTER — Telehealth: Payer: Self-pay | Admitting: Internal Medicine

## 2019-12-13 NOTE — Telephone Encounter (Signed)
Spoke to pt concerning her test results. Pt stated that she was calling concerning her [facility]. Screening. Pt was informed that in her chart it stated that it was collected but not results are there. Pt was informed that the lab tech that completed the lab draw would be informed and the lab tech would check into the order. Pt was informed that as soon as we get an answer we would contact her. Pt voiced that she understood.

## 2019-12-13 NOTE — Telephone Encounter (Signed)
Patient phoned on this date and stated that she would not be able to attend her appt on 01-03-20 and needed to reschedule to another day. Appts moved to 01-04-20.

## 2019-12-15 NOTE — Telephone Encounter (Signed)
Spoke to pt and informed her that the test that she had done would take about 24-30 days to process the results. Pt voiced that she understood.

## 2019-12-29 LAB — VISTASEQ HERED. CANCER PANEL: Result Summary: NEGATIVE

## 2020-01-03 ENCOUNTER — Other Ambulatory Visit: Payer: Medicare Other

## 2020-01-03 ENCOUNTER — Ambulatory Visit: Payer: Medicare Other

## 2020-01-03 ENCOUNTER — Ambulatory Visit: Payer: Medicare Other | Admitting: Internal Medicine

## 2020-01-04 ENCOUNTER — Telehealth: Payer: Self-pay | Admitting: *Deleted

## 2020-01-04 ENCOUNTER — Inpatient Hospital Stay: Payer: Medicare Other | Admitting: Internal Medicine

## 2020-01-04 ENCOUNTER — Inpatient Hospital Stay: Payer: Medicare Other

## 2020-01-04 NOTE — Assessment & Plan Note (Deleted)
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  Stable.  Hb -8.8 [see below]  Platelets/LDH are normal.  Continue Ultimoris infusions every 8 weeks; continue indefinitely at this time. STABLE.   # Anemia- multifactorial- 11. [do not suspect secondary to atypical HUS]; likely secondary to ESRD  on procrit/Iron  prn per nephrology.STABLE.  # ESRD- on PD- STABLE;  [awaiting transplant list].   # Hypertension-157/111-Overall STABLE; Recommend compliance with medications.  # Poorly controlled DM- 310; on insulin pump; STABLE.   # COVID vaccine: Intolerance to covid vaccine#1.  Letter given- patient's concerns.  # DISPOSITION: # Treatment today # follow up in 8 weeks- MD- labs-cbc.cmp'LDH;Ultomiris-Dr.B

## 2020-01-04 NOTE — Progress Notes (Deleted)
[facility] NOTE  Patient Care Team: Rubie Maid, MD as PCP - General (Obstetrics and Gynecology) [name], FNP as Consulting Physician (Family Medicine) Azzie Glatter, MD as Consulting Physician (Internal Medicine) Sherryll Burger, MD as Consulting Physician (Nephrology) Cammie Sickle, MD as Consulting Physician (Internal Medicine)  CHIEF COMPLAINTS/PURPOSE OF CONSULTATION: Atypical HUS  # ATYPICAL HEMOLYTIC UREMIC SYNDROME [April 2019-Dx; [facility]; Dr.Arepally;s/p Plex x1; April 26th- Ecluzimab 1200 mg q 2W; march 25th 2020- Ultomiris q  8 weeks.   # AKI/CKD [name] 2018;FEB 2020- hemodialysis [[facility] in [place]; Tuesday Thursday Saturday]; May 2020-peritoneal dialysis  # IDDM [since age of 2- Dr.Kerr,/Endo]  # WORK UP for HUS [[facility]] Phospholipase A2 Receptor AB/ S Phospholipase A2 Receptor IFA-NEGATIVE   Oncology History   No history exists.   HISTORY OF PRESENTING ILLNESS:  Kelly Huff 34 y.o.  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  Patient had episode abdominal pain-which was initially concerning for peritonitis.  However negative.  She continues to get peritoneal dialysis without any complications.  She has been getting IV iron; Procrit during dialysis.  Otherwise denies any swelling in legs.  No fevers or chills.  Patient reluctant with second dose of Covid vaccine given her previous intolerance to dose #1  Review of Systems  Constitutional: Positive for malaise/fatigue. Negative for chills, diaphoresis, fever and weight loss.  HENT: Negative for nosebleeds and sore throat.   Eyes: Negative for double vision.  Respiratory: Negative for cough, hemoptysis, sputum production, shortness of breath and wheezing.   Cardiovascular: Negative for chest pain, palpitations, orthopnea and leg swelling.  Gastrointestinal:  Negative for abdominal pain, blood in stool, constipation, diarrhea, heartburn, melena, nausea and vomiting.  Genitourinary: Negative for dysuria, frequency and urgency.  Musculoskeletal: Negative for back pain and joint pain.  Skin: Negative.  Negative for itching and rash.  Neurological: Negative for dizziness, tingling, focal weakness, weakness and headaches.  Endo/Heme/Allergies: Does not bruise/bleed easily.  Psychiatric/Behavioral: Negative for depression. The patient is not nervous/anxious and does not have insomnia.     MEDICAL HISTORY:  Past Medical History:  Diagnosis Date  . Chronic kidney disease   . Diabetes mellitus without complication ([place])   . History of pre-eclampsia 2016   mild  . Hypertension     SURGICAL HISTORY: Past Surgical History:  Procedure Laterality Date  . CESAREAN SECTION    . DILATION AND CURETTAGE OF UTERUS     x 4  . IR FLUORO GUIDE CV LINE RIGHT  12/05/2018  . IR FLUORO GUIDE CV LINE RIGHT  12/13/2018  . IR REMOVAL TUN CV CATH W/O FL  02/22/2019  . IR US GUIDE VASC ACCESS RIGHT  12/05/2018  . IR US GUIDE VASC ACCESS RIGHT  12/13/2018  . LAPAROSCOPIC APPENDECTOMY N/A 12/04/2019   Procedure: APPENDECTOMY LAPAROSCOPIC;  Surgeon: Donnie Mesa, MD;  Location: [facility];  Service: General;  Laterality: N/A;    SOCIAL HISTORY: Social History   Socioeconomic History  . Marital status: Married    Spouse name: Not on file  . Number of children: Not on file  . Years of education: Not on file  . Highest education level: Not on file  Occupational History  . Not on file  Tobacco Use  . Smoking status: Never Smoker  . Smokeless tobacco: Never Used  Vaping Use  . Vaping Use: Never used  Substance and Sexual Activity  .  Alcohol use: No  . Drug use: No  . Sexual activity: Yes    Birth control/protection: None, Surgical    Comment: tubial  Other Topics Concern  . Not on file  Social History Narrative  . Not on file   Social Determinants of Health    Financial Resource Strain:   . Difficulty of Paying Living Expenses: Not on file  Food Insecurity:   . Worried About Charity fundraiser in the Last Year: Not on file  . Ran Out of Food in the Last Year: Not on file  Transportation Needs:   . Lack of Transportation (Medical): Not on file  . Lack of Transportation (Non-Medical): Not on file  Physical Activity:   . Days of Exercise per Week: Not on file  . Minutes of Exercise per Session: Not on file  Stress:   . Feeling of Stress : Not on file  Social Connections:   . Frequency of Communication with Friends and Family: Not on file  . Frequency of Social Gatherings with Friends and Family: Not on file  . Attends Religious Services: Not on file  . Active Member of Clubs or Organizations: Not on file  . Attends Archivist Meetings: Not on file  . Marital Status: Not on file  Intimate Partner Violence:   . Fear of Current or Ex-Partner: Not on file  . Emotionally Abused: Not on file  . Physically Abused: Not on file  . Sexually Abused: Not on file    FAMILY HISTORY: Family History  Problem Relation Age of Onset  . Hypertension Mother   . Cancer Mother        Uterine vs cervical (pt unsure),   . Schizophrenia Brother   . Breast cancer Neg Hx     ALLERGIES:  is allergic to morphine, sulfamethoxazole-trimethoprim, and trulicity [dulaglutide].  MEDICATIONS:  Current Outpatient Medications  Medication Sig Dispense Refill  . acetaminophen (TYLENOL) 325 MG tablet Take 2 tablets (650 mg total) by mouth every 4 (four) hours as needed for mild pain or fever.    Marland Kitchen amLODipine (NORVASC) 10 MG tablet Take 10 mg by mouth every evening.     . calcium acetate (PHOSLO) 667 MG tablet Take 1,334 mg by mouth 3 (three) times daily with meals.    . cloNIDine (CATAPRES - DOSED IN MG/24 HR) 0.1 mg/24hr patch Place 0.1 mg onto the skin See admin instructions. Apply patch on every Sunday    . cyclobenzaprine (FLEXERIL) 10 MG tablet Take  10 mg by mouth at bedtime as needed for muscle spasms.     Marland Kitchen docusate sodium (COLACE) 100 MG capsule Take 100 mg by mouth daily as needed for mild constipation.     Marland Kitchen gentamicin cream (GARAMYCIN) 0.1 % Apply 1 application topically See admin instructions. Apply small amount to catheter exit site once daily    . insulin aspart (NOVOLOG) 100 UNIT/ML injection Inject 7 Units into the skin 3 (three) times daily before meals.    . insulin glargine (LANTUS) 100 UNIT/ML injection Inject 15 Units into the skin daily.    . metoprolol succinate (TOPROL-XL) 100 MG 24 hr tablet Take 100 mg by mouth 2 (two) times daily.    Marland Kitchen oxyCODONE (OXY IR/ROXICODONE) 5 MG immediate release tablet Take 1 tablet (5 mg total) by mouth every 6 (six) hours as needed for severe pain. 15 tablet 0  . polyethylene glycol (MIRALAX / GLYCOLAX) 17 g packet Take 17 g by mouth daily as needed  for mild constipation. 14 each 0   No current facility-administered medications for this visit.   Facility-Administered Medications Ordered in Other Visits  Medication Dose Route Frequency Provider Last Rate Last Admin  . sodium chloride flush (NS) 0.9 % injection 10 mL  10 mL Intravenous PRN Cammie Sickle, MD       PHYSICAL EXAMINATION: ECOG PERFORMANCE STATUS: 1 - Symptomatic but completely ambulatory  There were no vitals filed for this visit. There were no vitals filed for this visit.  Physical Exam Constitutional:      Comments: Patient is alone.  HENT:     Head: Normocephalic and atraumatic.     Mouth/Throat:     Pharynx: No oropharyngeal exudate.  Eyes:     Pupils: Pupils are equal, round, and reactive to light.  Cardiovascular:     Rate and Rhythm: Normal rate and regular rhythm.  Pulmonary:     Effort: No respiratory distress.     Breath sounds: No wheezing.  Abdominal:     General: Bowel sounds are normal. There is no distension.     Palpations: Abdomen is soft. There is no mass.     Tenderness: There is no  abdominal tenderness. There is no guarding or rebound.  Musculoskeletal:        General: No tenderness. Normal range of motion.     Cervical back: Normal range of motion and neck supple.  Skin:    General: Skin is warm.  Neurological:     Mental Status: She is alert and oriented to person, place, and time.  Psychiatric:        Mood and Affect: Affect normal.   ;  LABORATORY DATA:  I have reviewed the data as listed Lab Results  Component Value Date   WBC 8.2 12/05/2019   HGB 10.3 (L) 12/05/2019   HCT 32.1 (L) 12/05/2019   MCV 84.3 12/05/2019   PLT 306 12/05/2019   Recent Labs    09/06/19 0918 09/06/19 0918 11/08/19 0857 12/04/19 1748 12/05/19 0919  NA 129*   < > 135 134* 133*  K 4.7   < > 3.9 3.8 4.3  CL 93*   < > 92* 94* 95*  CO2 22   < > 27 25 22   GLUCOSE 532*   < > 310* 174* 279*  BUN 68*   < > 54* 49* 54*  CREATININE 15.69*   < > 15.19* 16.11* 16.70*  CALCIUM 8.3*   < > 8.9 8.0* 8.1*  GFRNONAA 3*   < > 3* 3* 2*  GFRAA 3*   < > 3* 3* 3*  PROT 6.9  --  7.4 5.8*  --   ALBUMIN 3.7  --  3.7 3.0*  --   AST 11*  --  13* 9*  --   ALT 11  --  11 8  --   ALKPHOS 134*  --  122 106  --   BILITOT 0.6  --  0.4 0.3  --    < > = values in this interval not displayed.    RADIOGRAPHIC STUDIES: I have personally reviewed the radiological images as listed and agreed with the findings in the report. No results found.  ASSESSMENT & PLAN:   No problem-specific Assessment & Plan notes found for this encounter.  All questions were answered. The patient knows to call the clinic with any problems, questions or concerns.    Cammie Sickle, MD 01/04/2020 7:48 AM

## 2020-01-04 NOTE — Telephone Encounter (Signed)
Not sure when her symptoms started but she would certainly qualify based on PMH. Let me know. I am happy to get her set up!  Faythe Casa, NP 01/04/2020 10:32 AM

## 2020-01-04 NOTE — Telephone Encounter (Signed)
pt called on 01/04/20 to cx her 01/04/20 lab/MD/Infusion appts. She stated that she texted Positive for Covid19.She stated that she would call the office back at a later date to have appt R/S... 01/04/20 appts were cx.Marland KitchenMarland Kitchen

## 2020-01-04 NOTE — Telephone Encounter (Signed)
Dr. Jacinto Reap- do you want to recommend "mab" therapy for patient given her complicated medical history?

## 2020-01-05 ENCOUNTER — Other Ambulatory Visit (HOSPITAL_COMMUNITY): Payer: Self-pay | Admitting: Nurse Practitioner

## 2020-01-05 ENCOUNTER — Ambulatory Visit (HOSPITAL_COMMUNITY)
Admission: RE | Admit: 2020-01-05 | Discharge: 2020-01-05 | Disposition: A | Discharge status: Home / Self Care | Payer: Medicare Other | Source: Ambulatory Visit | Attending: Pulmonary Disease | Admitting: Pulmonary Disease

## 2020-01-05 DIAGNOSIS — E1159 Type 2 diabetes mellitus with other circulatory complications: Secondary | ICD-10-CM | POA: Diagnosis present

## 2020-01-05 DIAGNOSIS — I152 Hypertension secondary to endocrine disorders: Secondary | ICD-10-CM

## 2020-01-05 DIAGNOSIS — N186 End stage renal disease: Secondary | ICD-10-CM

## 2020-01-05 DIAGNOSIS — E103219 Type 1 diabetes mellitus with mild nonproliferative diabetic retinopathy with macular edema, unspecified eye: Secondary | ICD-10-CM

## 2020-01-05 DIAGNOSIS — I10 Essential (primary) hypertension: Secondary | ICD-10-CM | POA: Diagnosis present

## 2020-01-05 DIAGNOSIS — Z23 Encounter for immunization: Secondary | ICD-10-CM | POA: Diagnosis not present

## 2020-01-05 DIAGNOSIS — Z992 Dependence on renal dialysis: Secondary | ICD-10-CM | POA: Diagnosis present

## 2020-01-05 DIAGNOSIS — U071 COVID-19: Secondary | ICD-10-CM

## 2020-01-05 MED ORDER — METHYLPREDNISOLONE SODIUM SUCC 125 MG IJ SOLR: 125.0000 mg | Freq: Once | INTRAMUSCULAR | Status: DC | PRN

## 2020-01-05 MED ORDER — ALBUTEROL SULFATE HFA 108 (90 BASE) MCG/ACT IN AERS: 2.0000 | INHALATION_SPRAY | Freq: Once | RESPIRATORY_TRACT | Status: DC | PRN

## 2020-01-05 MED ORDER — DIPHENHYDRAMINE HCL 50 MG/ML IJ SOLN: 50.0000 mg | Freq: Once | INTRAMUSCULAR | Status: DC | PRN

## 2020-01-05 MED ORDER — SODIUM CHLORIDE 0.9 % IV SOLN: INTRAVENOUS | Status: DC | PRN

## 2020-01-05 MED ORDER — EPINEPHRINE 0.3 MG/0.3ML IJ SOAJ: 0.3000 mg | Freq: Once | INTRAMUSCULAR | Status: DC | PRN

## 2020-01-05 MED ORDER — SODIUM CHLORIDE 0.9 % IV SOLN
1200.0000 mg | Freq: Once | INTRAVENOUS | Status: AC
  Administered 2020-01-05: 1200 mg via INTRAVENOUS
  Filled 2020-01-05: qty 10

## 2020-01-05 MED ORDER — FAMOTIDINE IN NACL 20-0.9 MG/50ML-% IV SOLN: 20.0000 mg | Freq: Once | INTRAVENOUS | Status: DC | PRN

## 2020-01-05 NOTE — Progress Notes (Signed)
  Diagnosis: COVID-19  Physician: Dr. Asencion Noble  Procedure: Covid Infusion Clinic Med: casirivimab\imdevimab infusion - Provided patient with casirivimab\imdevimab fact sheet for patients, parents and caregivers prior to infusion.  Complications: No immediate complications noted.  Discharge: Discharged home   Randa Evens [facility] 01/05/2020

## 2020-01-05 NOTE — Telephone Encounter (Signed)
Spoke to patient and she will be getting monoclonal antibodies today at 1:00. I've asked her to follow up with me by phone Monday to see how she's feeling. Symptoms reviewed as well as criteria for ending isolation.  Symptoms reviewed that would warrant ED/Hospital evaluation as well should condition worsen. Preventative practices reviewed. Patient verbalized understanding.

## 2020-01-05 NOTE — Progress Notes (Signed)
I connected by phone with Kelly Huff on 01/05/2020 at 10:49 AM to discuss the potential use of a new treatment for mild to moderate COVID-19 viral infection in non-hospitalized patients.  This patient is a 34 y.o. female that meets the FDA criteria for Emergency Use Authorization of COVID monoclonal antibody casirivimab/imdevimab.  Has a (+) direct SARS-CoV-2 viral test result [facility] Every[facility]  Has mild or moderate COVID-19   Is NOT hospitalized due to COVID-19  Is within 10 days of symptom onset 01/01/20  Has at least one of the high risk factor(s) for progression to severe COVID-19 and/or hospitalization as defined in EUA.  Specific high risk criteria : BMI > 25 and Chronic Kidney Disease (CKD)   I have spoken and communicated the following to the patient or parent/caregiver regarding COVID monoclonal antibody treatment:  1. FDA has authorized the emergency use for the treatment of mild to moderate COVID-19 in adults and pediatric patients with positive results of direct SARS-CoV-2 viral testing who are 64 years of age and older weighing at least 40 kg, and who are at high risk for progressing to severe COVID-19 and/or hospitalization.  2. The significant known and potential risks and benefits of COVID monoclonal antibody, and the extent to which such potential risks and benefits are unknown.  3. Information on available alternative treatments and the risks and benefits of those alternatives, including clinical trials.  4. Patients treated with COVID monoclonal antibody should continue to self-isolate and use infection control measures (e.g., wear mask, isolate, social distance, avoid sharing personal items, clean and disinfect "high touch" surfaces, and frequent handwashing) according to CDC guidelines.   5. The patient or parent/caregiver has the option to accept or refuse COVID monoclonal antibody treatment.  After reviewing this information with the patient, The patient agreed to  proceed with receiving casirivimab\imdevimab infusion and will be provided a copy of the Fact sheet prior to receiving the infusion. Kelly Huff 01/05/2020 10:49 AM

## 2020-01-05 NOTE — Discharge Instructions (Signed)
10 Things You Can Do to Manage Your COVID-19 Symptoms at Home If you have possible or confirmed COVID-19: 1. Stay home from work and school. And stay away from other public places. If you must go out, avoid using any kind of public transportation, ridesharing, or taxis. 2. Monitor your symptoms carefully. If your symptoms get worse, call your healthcare provider immediately. 3. Get rest and stay hydrated. 4. If you have a medical appointment, call the healthcare provider ahead of time and tell them that you have or may have COVID-19. 5. For medical emergencies, call 911 and notify the dispatch personnel that you have or may have COVID-19. 6. Cover your cough and sneezes with a tissue or use the inside of your elbow. 7. Wash your hands often with soap and water for at least 20 seconds or clean your hands with an alcohol-based hand sanitizer that contains at least 60% alcohol. 8. As much as possible, stay in a specific room and away from other people in your home. Also, you should use a separate bathroom, if available. If you need to be around other people in or outside of the home, wear a mask. 9. Avoid sharing personal items with other people in your household, like dishes, towels, and bedding. 10. Clean all surfaces that are touched often, like counters, tabletops, and doorknobs. Use household cleaning sprays or wipes according to the label instructions. [email or web address] 10/26/2018 This information is not intended to replace advice given to you by your health care provider. Make sure you discuss any questions you have with your health care provider.  What types of side effects do monoclonal antibody drugs cause?  Common side effects  In general, the more common side effects caused by monoclonal antibody drugs include: . Allergic reactions, such as hives or itching . Flu-like signs and symptoms, including chills, fatigue, fever, and muscle aches and pains . Nausea,  vomiting . Diarrhea . Skin rashes . Low blood pressure   The CDC is recommending patients who receive monoclonal antibody treatments wait at least 90 days before being vaccinated.  Currently, there are no data on the safety and efficacy of mRNA COVID-19 vaccines in persons who received monoclonal antibodies or convalescent plasma as part of COVID-19 treatment. Based on the estimated half-life of such therapies as well as evidence suggesting that reinfection is uncommon in the 90 days after initial infection, vaccination should be deferred for at least 90 days, as a precautionary measure until additional information becomes available, to avoid interference of the antibody treatment with vaccine-induced immune responses. Document Revised: 03/30/2019 Document Reviewed: 03/30/2019 Elsevier Patient Education  [place].

## 2020-01-09 ENCOUNTER — Telehealth: Payer: Self-pay | Admitting: *Deleted

## 2020-01-09 ENCOUNTER — Inpatient Hospital Stay: Payer: Medicare Other | Attending: Nurse Practitioner | Admitting: Nurse Practitioner

## 2020-01-09 ENCOUNTER — Telehealth: Payer: Self-pay | Admitting: Obstetrics and Gynecology

## 2020-01-09 ENCOUNTER — Encounter: Payer: Self-pay | Admitting: Nurse Practitioner

## 2020-01-09 DIAGNOSIS — D593 Hemolytic-uremic syndrome: Secondary | ICD-10-CM | POA: Insufficient documentation

## 2020-01-09 DIAGNOSIS — U071 COVID-19: Secondary | ICD-10-CM | POA: Insufficient documentation

## 2020-01-09 DIAGNOSIS — J020 Streptococcal pharyngitis: Secondary | ICD-10-CM

## 2020-01-09 MED ORDER — PREDNISONE 5 MG PO TABS
5.0000 mg | ORAL_TABLET | Freq: Every morning | ORAL | 0 refills | Status: AC
Start: 2020-01-09 — End: 2020-01-14

## 2020-01-09 MED ORDER — AMOXICILLIN 500 MG PO CAPS: 500.0000 mg | ORAL_CAPSULE | Freq: Two times a day (BID) | ORAL | 0 refills | Status: AC

## 2020-01-09 NOTE — Telephone Encounter (Signed)
I responded in [place]

## 2020-01-09 NOTE — Telephone Encounter (Signed)
Sure. How about 2:15 today? Thanks!

## 2020-01-09 NOTE — Progress Notes (Signed)
Virtual Visit Progress Note  Symptom Management Clinic [facility]  Telephone:([phone] [phone]  I connected with Kelly Huff on 01/09/20 at  2:15 PM EDT by video enabled telemedicine visit and verified that I am speaking with the correct person using two identifiers.   I discussed the limitations, risks, security and privacy concerns of performing an evaluation and management service by telemedicine and the availability of in-person appointments. I also discussed with the patient that there may be a patient responsible charge related to this service. The patient expressed understanding and agreed to proceed.   Other persons participating in the visit and their role in the encounter: none  Patient's location: home Provider's location: clinic  Chief Complaint: sore throat    Patient Care Team: Rubie Maid, MD as PCP - General (Obstetrics and Gynecology) [name], FNP as Consulting Physician (Family Medicine) Azzie Glatter, MD as Consulting Physician (Internal Medicine) Sherryll Burger, MD as Consulting Physician (Nephrology) Cammie Sickle, MD as Consulting Physician (Internal Medicine)   Name of the patient: Kelly Huff  [id number]  10-22-1985   Date of visit: 01/09/20  Diagnosis-hemolytic uremic syndrome  Chief complaint/ Reason for visit-sore throat  Heme/Onc history:  Oncology History   No history exists.    Interval history-patient presents to symptom management clinic for complaints of sore throat.  She was diagnosed with Covid and symptoms of Covid started on 01/01/2020.  She has since received monoclonal antibodies and generally her symptoms were improving until yesterday when she noticed a sore throat.  Talking is painful.  Swallowing is painful.  No fever.  Headache and fatigue have improved somewhat.  Has tried taking over-the-counter's without improvement in her symptoms.  Denies strep exposure but has  noticed white patches on her tonsils.  No nausea or vomiting.  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  Review of systems- Review of Systems  Constitutional: Positive for malaise/fatigue. Negative for chills, fever and weight loss.  HENT: Positive for congestion and sore throat. Negative for hearing loss, nosebleeds and tinnitus.   Eyes: Negative for blurred vision and double vision.  Respiratory: Negative for cough, hemoptysis, shortness of breath and wheezing.   Cardiovascular: Negative for chest pain, palpitations and leg swelling.  Gastrointestinal: Negative for abdominal pain, blood in stool, constipation, diarrhea, melena, nausea and vomiting.  Genitourinary: Negative for dysuria and urgency.  Musculoskeletal: Negative for back pain, falls, joint pain and myalgias.  Skin: Negative for itching and rash.  Neurological: Positive for headaches. Negative for dizziness, tingling, sensory change, loss of consciousness and weakness.  Endo/Heme/Allergies: Negative for environmental allergies. Does not bruise/bleed easily.  Psychiatric/Behavioral: Negative for depression. The patient is not nervous/anxious and does not have insomnia.      Allergies  Allergen Reactions  . Morphine Itching and Other (See Comments)    Hallucination  . Sulfamethoxazole-Trimethoprim Hives  . Trulicity [Dulaglutide] Other (See Comments)    She is DM Type 1. Was prescribed but damaged her kidneys    Past Medical History:  Diagnosis Date  . Chronic kidney disease   . Diabetes mellitus without complication ([place])   . History of pre-eclampsia 2016   mild  . Hypertension     Past Surgical History:  Procedure Laterality Date  . CESAREAN SECTION    . DILATION AND CURETTAGE OF UTERUS     x 4  . IR FLUORO GUIDE CV LINE RIGHT  12/05/2018  . IR FLUORO GUIDE  CV LINE RIGHT  12/13/2018  . IR REMOVAL TUN CV CATH W/O FL  02/22/2019  . IR US GUIDE VASC ACCESS RIGHT  12/05/2018  .  IR US GUIDE VASC ACCESS RIGHT  12/13/2018  . LAPAROSCOPIC APPENDECTOMY N/A 12/04/2019   Procedure: APPENDECTOMY LAPAROSCOPIC;  Surgeon: Donnie Mesa, MD;  Location: [place];  Service: General;  Laterality: N/A;    Social History   Socioeconomic History  . Marital status: Married    Spouse name: Not on file  . Number of children: Not on file  . Years of education: Not on file  . Highest education level: Not on file  Occupational History  . Not on file  Tobacco Use  . Smoking status: Never Smoker  . Smokeless tobacco: Never Used  Vaping Use  . Vaping Use: Never used  Substance and Sexual Activity  . Alcohol use: No  . Drug use: No  . Sexual activity: Yes    Birth control/protection: None, Surgical    Comment: tubial  Other Topics Concern  . Not on file  Social History Narrative  . Not on file   Social Determinants of Health   Financial Resource Strain:   . Difficulty of Paying Living Expenses: Not on file  Food Insecurity:   . Worried About Charity fundraiser in the Last Year: Not on file  . Ran Out of Food in the Last Year: Not on file  Transportation Needs:   . Lack of Transportation (Medical): Not on file  . Lack of Transportation (Non-Medical): Not on file  Physical Activity:   . Days of Exercise per Week: Not on file  . Minutes of Exercise per Session: Not on file  Stress:   . Feeling of Stress : Not on file  Social Connections:   . Frequency of Communication with Friends and Family: Not on file  . Frequency of Social Gatherings with Friends and Family: Not on file  . Attends Religious Services: Not on file  . Active Member of Clubs or Organizations: Not on file  . Attends Archivist Meetings: Not on file  . Marital Status: Not on file  Intimate Partner Violence:   . Fear of Current or Ex-Partner: Not on file  . Emotionally Abused: Not on file  . Physically Abused: Not on file  . Sexually Abused: Not on file    Family History  Problem Relation  Age of Onset  . Hypertension Mother   . Cancer Mother        Uterine vs cervical (pt unsure),   . Schizophrenia Brother   . Breast cancer Neg Hx      Current Outpatient Medications:  .  acetaminophen (TYLENOL) 325 MG tablet, Take 2 tablets (650 mg total) by mouth every 4 (four) hours as needed for mild pain or fever., Disp: , Rfl:  .  amLODipine (NORVASC) 10 MG tablet, Take 10 mg by mouth every evening. , Disp: , Rfl:  .  calcium acetate (PHOSLO) 667 MG tablet, Take 1,334 mg by mouth 3 (three) times daily with meals., Disp: , Rfl:  .  cloNIDine (CATAPRES - DOSED IN MG/24 HR) 0.1 mg/24hr patch, Place 0.1 mg onto the skin See admin instructions. Apply patch on every Sunday, Disp: , Rfl:  .  cyclobenzaprine (FLEXERIL) 10 MG tablet, Take 10 mg by mouth at bedtime as needed for muscle spasms. , Disp: , Rfl:  .  docusate sodium (COLACE) 100 MG capsule, Take 100 mg by mouth daily as needed  for mild constipation. , Disp: , Rfl:  .  gentamicin cream (GARAMYCIN) 0.1 %, Apply 1 application topically See admin instructions. Apply small amount to catheter exit site once daily, Disp: , Rfl:  .  insulin aspart (NOVOLOG) 100 UNIT/ML injection, Inject 7 Units into the skin 3 (three) times daily before meals., Disp: , Rfl:  .  insulin glargine (LANTUS) 100 UNIT/ML injection, Inject 15 Units into the skin daily., Disp: , Rfl:  .  metoprolol succinate (TOPROL-XL) 100 MG 24 hr tablet, Take 100 mg by mouth 2 (two) times daily., Disp: , Rfl:  .  oxyCODONE (OXY IR/ROXICODONE) 5 MG immediate release tablet, Take 1 tablet (5 mg total) by mouth every 6 (six) hours as needed for severe pain., Disp: 15 tablet, Rfl: 0 .  polyethylene glycol (MIRALAX / GLYCOLAX) 17 g packet, Take 17 g by mouth daily as needed for mild constipation., Disp: 14 each, Rfl: 0 No current facility-administered medications for this visit.  Facility-Administered Medications Ordered in Other Visits:  .  sodium chloride flush (NS) 0.9 % injection 10  mL, 10 mL, Intravenous, PRN, Cammie Sickle, MD  Physical exam: Exam limited due to telemedicine  There were no vitals filed for this visit. Physical Exam Constitutional:      General: She is not in acute distress. HENT:     Nose: Congestion present.     Mouth/Throat:     Pharynx: No oropharyngeal exudate.     Comments: Managing secretions Eyes:     General: No scleral icterus. Neck:     Comments: Moving neck freely Pulmonary:     Effort: Pulmonary effort is normal. No respiratory distress.     Breath sounds: No wheezing.  Musculoskeletal:        General: No deformity.     Cervical back: Normal range of motion. No rigidity.  Neurological:     Mental Status: She is alert and oriented to person, place, and time.  Psychiatric:        Mood and Affect: Mood normal.        Behavior: Behavior normal.      CMP Latest Ref Rng & Units 12/05/2019  Glucose 70 - 99 mg/dL 279(H)  BUN 6 - 20 mg/dL 54(H)  Creatinine 0.44 - 1.00 mg/dL 16.70(H)  Sodium 135 - 145 mmol/L 133(L)  Potassium 3.5 - 5.1 mmol/L 4.3  Chloride 98 - 111 mmol/L 95(L)  CO2 22 - 32 mmol/L 22  Calcium 8.9 - 10.3 mg/dL 8.1(L)  Total Protein 6.5 - 8.1 g/dL -  Total Bilirubin 0.3 - 1.2 mg/dL -  Alkaline Phos 38 - 126 U/L -  AST 15 - 41 U/L -  ALT 0 - 44 U/L -   CBC Latest Ref Rng & Units 12/05/2019  WBC 4.0 - 10.5 K/uL 8.2  Hemoglobin 12.0 - 15.0 g/dL 10.3(L)  Hematocrit 36 - 46 % 32.1(L)  Platelets 150 - 400 K/uL 306    No images are attached to the encounter.  No results found.  Assessment and plan- Patient is a 34 y.o. 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  I question if secondary to Covid infection as opposed to other etiologies including strep throat.  Given that she has had antibody treatment, symptoms were improving, and now has a sore throat with white  patches, will treat presumptively for strep pharyngitis with amoxicillin 500 mg twice a  day, renally dosed for peritoneal dialysis.  Given her Covid infection and pain we will also send low dose of prednisone for pain/swelling. Encouraged hydration and rest. Covid precautions reviewed as well as symptoms tier and ending isolation.  She does not have a PCP so I've provided her with phone number for patient engagement center to help her to establish care with someone locally.    Visit Diagnosis 1. COVID-19 virus infection   2. Strep pharyngitis     Patient expressed understanding and was in agreement with this plan. She also understands that She can call clinic at any time with any questions, concerns, or complaints.   I discussed the assessment and treatment plan with the patient. The patient was provided an opportunity to ask questions and all were answered. The patient agreed with the plan and demonstrated an understanding of the instructions.   The patient was advised to call back or seek an in-person evaluation if the symptoms worsen or if the condition fails to improve as anticipated.   I provided 20 minutes of face-to-face video visit time during this encounter, and > 50% was spent counseling as documented under my assessment & plan.  Thank you for allowing me to participate in the care of this very pleasant patient.   Beckey Rutter, DNP, AGNP-[facility]

## 2020-01-09 NOTE — Telephone Encounter (Signed)
Lauren - can you do a virtual visit with patient?

## 2020-01-09 NOTE — Telephone Encounter (Signed)
Patient called reporting that she needs something for her neck pain and sore throat. Reports that her neck is swollen and that she had a MAB infusion the other day for her COVID. He reports that she does not have a PCP. Please advise

## 2020-01-09 NOTE — Telephone Encounter (Signed)
Pt called in and stated that she has tested positive for Covid. The pt stated that Dr. Marcelline Mates is listed as her PCP and she wanted to know if she can send something in to the pharmacy for a sore throat. I told the pt that we are an obgyn, the pt said she doesn't have a PCP that she only sees Dr. Marcelline Mates.  I told the pt I will  send a message and a nurse will be in touch the pt uses [facility] on [address]. Please advise

## 2020-01-09 NOTE — Patient Instructions (Signed)
Please call the [place] Patient Engagement Center at [phone] to establish care with a Primary Care Provider.   In the meantime, you can access care through the following free and reduced cost health care locations in [place]:   -[facility] ([address], [place], [place] [address]- [phone])  -[facility] ([address], [address], [place] [address]- [phone])  -[facility] ([address], [place], [place] [address]- [phone])  -[facility] ([address], [place]- [phone])  -[facility] [place]- [phone])  

## 2020-01-09 NOTE — Telephone Encounter (Signed)
Patient agreeable to apt at 215 pm virtual visit-mychart. Patient has been using tylenol 1000 mg (every 4 hours since she had covid + results.) Patient has white patches on her tongue and back of throat. She is concerned about possibility of strep vs thrush. She was recently admitted for an appendectomy and only had iv antibiotics. She did not go home on any oral antibiotics. Patient tested positive for covid on Saturday. She has also taken Benadryl. She has oxycodone left over from her appendicitis and she took one of these tablets a few minutes ago to help with the pain from her swollen lymph nodes. She is limited to what she can take for pain given her renal issues/dialysis. She has also tried cloraseptic lonzeges and ice chips and cold compresses on her neck. She had dizziness after the mab therapy but does not feel dizzy as much. 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 She reports this is a struggle due to the fatigue.

## 2020-01-09 NOTE — Telephone Encounter (Signed)
Please advise. Thanks Yazlyn Wentzel 

## 2020-01-19 ENCOUNTER — Telehealth: Payer: Self-pay | Admitting: *Deleted

## 2020-01-19 ENCOUNTER — Encounter: Payer: Self-pay | Admitting: Nurse Practitioner

## 2020-01-19 ENCOUNTER — Other Ambulatory Visit: Payer: Self-pay | Admitting: Oncology

## 2020-01-19 ENCOUNTER — Inpatient Hospital Stay: Payer: Medicare Other

## 2020-01-19 ENCOUNTER — Telehealth: Payer: Self-pay | Admitting: Oncology

## 2020-01-19 ENCOUNTER — Ambulatory Visit (HOSPITAL_COMMUNITY)
Admission: RE | Admit: 2020-01-19 | Discharge: 2020-01-19 | Disposition: A | Discharge status: Home / Self Care | Payer: Medicare Other | Source: Ambulatory Visit | Attending: Nurse Practitioner | Admitting: Nurse Practitioner

## 2020-01-19 ENCOUNTER — Inpatient Hospital Stay: Payer: Medicare Other | Attending: Nurse Practitioner | Admitting: Nurse Practitioner

## 2020-01-19 ENCOUNTER — Other Ambulatory Visit: Payer: Self-pay

## 2020-01-19 VITALS — BP 179/126 | HR 92 | Temp 97.8°F | Resp 20 | Wt 169.4 lb

## 2020-01-19 DIAGNOSIS — R1084 Generalized abdominal pain: Secondary | ICD-10-CM

## 2020-01-19 DIAGNOSIS — D5939 Other hemolytic-uremic syndrome: Secondary | ICD-10-CM

## 2020-01-19 DIAGNOSIS — N186 End stage renal disease: Secondary | ICD-10-CM | POA: Diagnosis not present

## 2020-01-19 DIAGNOSIS — I120 Hypertensive chronic kidney disease with stage 5 chronic kidney disease or end stage renal disease: Secondary | ICD-10-CM | POA: Diagnosis not present

## 2020-01-19 DIAGNOSIS — Z79899 Other long term (current) drug therapy: Secondary | ICD-10-CM | POA: Insufficient documentation

## 2020-01-19 DIAGNOSIS — E1122 Type 2 diabetes mellitus with diabetic chronic kidney disease: Secondary | ICD-10-CM | POA: Insufficient documentation

## 2020-01-19 DIAGNOSIS — Z992 Dependence on renal dialysis: Secondary | ICD-10-CM | POA: Diagnosis not present

## 2020-01-19 DIAGNOSIS — D593 Hemolytic-uremic syndrome: Secondary | ICD-10-CM | POA: Insufficient documentation

## 2020-01-19 DIAGNOSIS — Z7682 Awaiting organ transplant status: Secondary | ICD-10-CM | POA: Diagnosis not present

## 2020-01-19 LAB — COMPREHENSIVE METABOLIC PANEL
ALT: 8 U/L (ref 0–44)
AST: 12 U/L — ABNORMAL LOW (ref 15–41)
Albumin: 3.4 g/dL — ABNORMAL LOW (ref 3.5–5.0)
Alkaline Phosphatase: 92 U/L (ref 38–126)
Anion gap: 18 — ABNORMAL HIGH (ref 5–15)
BUN: 77 mg/dL — ABNORMAL HIGH (ref 6–20)
CO2: 23 mmol/L (ref 22–32)
Calcium: 8.3 mg/dL — ABNORMAL LOW (ref 8.9–10.3)
Chloride: 90 mmol/L — ABNORMAL LOW (ref 98–111)
Creatinine, Ser: 18.91 mg/dL — ABNORMAL HIGH (ref 0.44–1.00)
GFR calc Af Amer: 2 mL/min — ABNORMAL LOW (ref >60)
GFR calc non Af Amer: 2 mL/min — ABNORMAL LOW (ref >60)
Glucose, Bld: 377 mg/dL — ABNORMAL HIGH (ref 70–99)
Potassium: 4.3 mmol/L (ref 3.5–5.1)
Sodium: 131 mmol/L — ABNORMAL LOW (ref 135–145)
Total Bilirubin: 0.6 mg/dL (ref 0.3–1.2)
Total Protein: 7 g/dL (ref 6.5–8.1)

## 2020-01-19 LAB — CBC WITH DIFFERENTIAL/PLATELET
Abs Immature Granulocytes: 0.02 10*3/uL (ref 0.00–0.07)
Basophils Absolute: 0.1 10*3/uL (ref 0.0–0.1)
Basophils Relative: 1 %
Eosinophils Absolute: 0.2 10*3/uL (ref 0.0–0.5)
Eosinophils Relative: 3 %
HCT: 27.4 % — ABNORMAL LOW (ref 36.0–46.0)
Hemoglobin: 9.4 g/dL — ABNORMAL LOW (ref 12.0–15.0)
Immature Granulocytes: 0 %
Lymphocytes Relative: 29 %
Lymphs Abs: 1.7 10*3/uL (ref 0.7–4.0)
MCH: 26.7 pg (ref 26.0–34.0)
MCHC: 34.3 g/dL (ref 30.0–36.0)
MCV: 77.8 fL — ABNORMAL LOW (ref 80.0–100.0)
Monocytes Absolute: 0.4 10*3/uL (ref 0.1–1.0)
Monocytes Relative: 7 %
Neutro Abs: 3.7 10*3/uL (ref 1.7–7.7)
Neutrophils Relative %: 60 %
Platelets: 422 10*3/uL — ABNORMAL HIGH (ref 150–400)
RBC: 3.52 MIL/uL — ABNORMAL LOW (ref 3.87–5.11)
RDW: 13.8 % (ref 11.5–15.5)
WBC: 6 10*3/uL (ref 4.0–10.5)
nRBC: 0 % (ref 0.0–0.2)

## 2020-01-19 LAB — LACTATE DEHYDROGENASE: LDH: 186 U/L (ref 98–192)

## 2020-01-19 NOTE — Progress Notes (Signed)
Patient states she is weak. Patient states that she thinks its something dealing with her dialysis. She at first thought it was because she hadn't had an infusion in a while due to [place]. Patient also states that she was in pain in the abdominal region last night and wasn't able to keep anything down. Patient also states that she has been feeling light headed since the beginning of the month.

## 2020-01-19 NOTE — Telephone Encounter (Signed)
Called pt to let her know we scheduled her stat CT of abd/pelvis in [place] at the main [facility]. Arrival time is 2:30 today~ to begin drinking the oral contrast. Pt states " I am sure I cannot keep that stuff down, I never can". Her scan is scheduled for 4pm. Pt states she knows where to check in for that appt and will go there. Pt will be going to Dialysis to have her PD catheter checked/ fluid retention prior to CT appointment.

## 2020-01-19 NOTE — Progress Notes (Signed)
Re: Weakness  Kelly Huff is a 34 yo female with past medical history significant for poorly controlled type 1 diabetes, chronic anemia, end-stage renal disease on peritoneal dialysis awaiting transplant, hypertension, recent Covid infection status post Mab infusion currently on [facility] infusions for atypical hemolytic uremic syndrome.  She typically receives her infusions every 8 weeks but unfortunately contracted COVID-19 and treatment has been delayed by about 3 weeks.  Patient called clinic complaining of generalized weakness and fatigue x1 week.  She is scheduled to be seen next week by Dr. Rogue Bussing for her infusion.  Spoke to Dr. Rogue Bussing who recommends bringing patient in for lab work (CBC, CMP, LDH and haptoglobin) and assessment with possible IVF.  Patient believes she is feeling this way due to delaying her treatment.  She is scheduled for a smc appointment on 01/19/2020 at [place], NP 01/19/2020 7:26 AM

## 2020-01-19 NOTE — Telephone Encounter (Signed)
Patient was seen by symptom management NP Beckey Rutter today for abdominal discomfort, nausea and vomiting.  Patient was sent for a stat abdomen CT which did not show acute findings to explain her symptoms.  Patient was advised by NP to check if her dialysis catheter is in good position.  Patient has an impression that NP advises her to call on-call physician to update the catheter and get medication sent to her pharmacy.  She says she had dialysis catheter checked and there is no problem.  There was no documentation about what NP plans to send for patient.  Patient feels that abdominal pain has improved and she is currently not having active nausea and vomiting.  She tells me that she has tried Zofran before which did not help.  Suppository for nausea caused the diarrhea which she does not want to try.  Advised patient to monitor her symptoms.  If gets worse, recommend her to go to ER for further evaluation.

## 2020-01-20 LAB — HAPTOGLOBIN: Haptoglobin: 242 mg/dL (ref 33–278)

## 2020-01-22 NOTE — Progress Notes (Signed)
[facility]  [phone] [phone]  Patient Care Team: Rubie Maid, MD as PCP - General (Obstetrics and Gynecology) [name], FNP as Consulting Physician (Family Medicine) Azzie Glatter, MD as Consulting Physician (Internal Medicine) Sherryll Burger, MD as Consulting Physician (Nephrology) Cammie Sickle, MD as Consulting Physician (Internal Medicine)   Name of the patient: Kelly Huff  [id number]  Jul 14, 1985   Date of visit: 01/22/20  Diagnosis-hemolytic uremic syndrome  Chief complaint/ Reason for visit-abdominal pain and weakness  Interval history-patient with history of type 1 diabetes, 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  She had Covid symptoms starting on 01/01/2020 that started with sore throat and received monoclonal antibody treatment.  Due to Covid infection her Ultimoris infusion has been delayed.  She complains of generalized weakness for approximately 1 week and abdominal pain over the last few days.  She questions possible infection due to peritoneal dialysis and has an appointment this afternoon to have fluid checked.  Had vomiting overnight and has been feeling lightheaded and tired.  No diarrhea.  No fevers or chills.  No falls.  No vomiting today.  Review of systems- Review of Systems  Constitutional: Positive for malaise/fatigue. Negative for chills, fever and weight loss.  HENT: Negative for hearing loss, nosebleeds, sore throat and tinnitus.   Eyes: Negative for blurred vision and double vision.  Respiratory: Negative for cough, hemoptysis, shortness of breath and wheezing.   Cardiovascular: Negative for chest pain, palpitations and leg swelling.  Gastrointestinal: Positive  for abdominal pain (lower abdomen ). Negative for blood in stool, constipation, diarrhea, melena, nausea and vomiting.  Genitourinary: Negative for dysuria and urgency.  Musculoskeletal: Negative for back pain, falls, joint pain and myalgias.  Skin: Negative for itching and rash.  Neurological: Positive for weakness. Negative for dizziness, tingling, sensory change, loss of consciousness and headaches.  Endo/Heme/Allergies: Negative for environmental allergies. Does not bruise/bleed easily.  Psychiatric/Behavioral: Negative for depression. The patient is not nervous/anxious and does not have insomnia.      Allergies  Allergen Reactions  . Morphine Itching and Other (See Comments)    Hallucination  . Sulfamethoxazole-Trimethoprim Hives  . Trulicity [Dulaglutide] Other (See Comments)    She is DM Type 1. Was prescribed but damaged her kidneys    Past Medical History:  Diagnosis Date  . Chronic kidney disease   . Diabetes mellitus without complication ([place])   . History of pre-eclampsia 2016   mild  . Hypertension     Past Surgical History:  Procedure Laterality Date  . CESAREAN SECTION    . DILATION AND CURETTAGE OF UTERUS     x 4  . IR FLUORO GUIDE CV LINE RIGHT  12/05/2018  . IR FLUORO GUIDE CV LINE RIGHT  12/13/2018  . IR REMOVAL TUN CV CATH W/O FL  02/22/2019  . IR US GUIDE VASC ACCESS RIGHT  12/05/2018  . IR US GUIDE VASC ACCESS RIGHT  12/13/2018  . LAPAROSCOPIC APPENDECTOMY N/A 12/04/2019   Procedure: APPENDECTOMY LAPAROSCOPIC;  Surgeon: Donnie Mesa, MD;  Location: [facility];  Service: General;  Laterality: N/A;    Social History   Socioeconomic History  . Marital status: Married    Spouse name: Not on file  . Number of children: Not on file  . Years of education: Not on  file  . Highest education level: Not on file  Occupational History  . Not on file  Tobacco Use  . Smoking status: Never Smoker  . Smokeless tobacco: Never Used  Vaping Use  . Vaping Use: Never used    Substance and Sexual Activity  . Alcohol use: No  . Drug use: No  . Sexual activity: Yes    Birth control/protection: None, Surgical    Comment: tubial  Other Topics Concern  . Not on file  Social History Narrative  . Not on file   Social Determinants of Health   Financial Resource Strain:   . Difficulty of Paying Living Expenses: Not on file  Food Insecurity:   . Worried About Charity fundraiser in the Last Year: Not on file  . Ran Out of Food in the Last Year: Not on file  Transportation Needs:   . Lack of Transportation (Medical): Not on file  . Lack of Transportation (Non-Medical): Not on file  Physical Activity:   . Days of Exercise per Week: Not on file  . Minutes of Exercise per Session: Not on file  Stress:   . Feeling of Stress : Not on file  Social Connections:   . Frequency of Communication with Friends and Family: Not on file  . Frequency of Social Gatherings with Friends and Family: Not on file  . Attends Religious Services: Not on file  . Active Member of Clubs or Organizations: Not on file  . Attends Archivist Meetings: Not on file  . Marital Status: Not on file  Intimate Partner Violence:   . Fear of Current or Ex-Partner: Not on file  . Emotionally Abused: Not on file  . Physically Abused: Not on file  . Sexually Abused: Not on file    Family History  Problem Relation Age of Onset  . Hypertension Mother   . Cancer Mother        Uterine vs cervical (pt unsure),   . Schizophrenia Brother   . Breast cancer Neg Hx      Current Outpatient Medications:  .  acetaminophen (TYLENOL) 325 MG tablet, Take 2 tablets (650 mg total) by mouth every 4 (four) hours as needed for mild pain or fever., Disp: , Rfl:  .  amLODipine (NORVASC) 10 MG tablet, Take 10 mg by mouth every evening. , Disp: , Rfl:  .  calcium acetate (PHOSLO) 667 MG tablet, Take 1,334 mg by mouth 3 (three) times daily with meals., Disp: , Rfl:  .  cloNIDine (CATAPRES - DOSED IN  MG/24 HR) 0.1 mg/24hr patch, Place 0.1 mg onto the skin See admin instructions. Apply patch on every Sunday, Disp: , Rfl:  .  cyclobenzaprine (FLEXERIL) 10 MG tablet, Take 10 mg by mouth at bedtime as needed for muscle spasms. , Disp: , Rfl:  .  docusate sodium (COLACE) 100 MG capsule, Take 100 mg by mouth daily as needed for mild constipation. , Disp: , Rfl:  .  gentamicin cream (GARAMYCIN) 0.1 %, Apply 1 application topically See admin instructions. Apply small amount to catheter exit site once daily, Disp: , Rfl:  .  insulin aspart (NOVOLOG) 100 UNIT/ML injection, Inject 7 Units into the skin 3 (three) times daily before meals., Disp: , Rfl:  .  insulin glargine (LANTUS) 100 UNIT/ML injection, Inject 15 Units into the skin daily., Disp: , Rfl:  .  metoprolol succinate (TOPROL-XL) 100 MG 24 hr tablet, Take 100 mg by mouth 2 (two) times daily., Disp: ,  Rfl:  .  oxyCODONE (OXY IR/ROXICODONE) 5 MG immediate release tablet, Take 1 tablet (5 mg total) by mouth every 6 (six) hours as needed for severe pain., Disp: 15 tablet, Rfl: 0 .  polyethylene glycol (MIRALAX / GLYCOLAX) 17 g packet, Take 17 g by mouth daily as needed for mild constipation., Disp: 14 each, Rfl: 0 No current facility-administered medications for this visit.  Facility-Administered Medications Ordered in Other Visits:  .  sodium chloride flush (NS) 0.9 % injection 10 mL, 10 mL, Intravenous, PRN, Cammie Sickle, MD  Physical exam:  Vitals:   01/19/20 1014  BP: (!) 179/126  Pulse: 92  Resp: 20  Temp: 97.8 F (36.6 C)  SpO2: 100%  Weight: 169 lb 6.4 oz (76.8 kg)   Physical Exam Constitutional:      Appearance: She is well-developed.     Comments: Fatigued appearing.   HENT:     Head: Atraumatic.     Nose: Nose normal.     Mouth/Throat:     Pharynx: No oropharyngeal exudate.  Eyes:     General: No scleral icterus.    Conjunctiva/sclera: Conjunctivae normal.  Cardiovascular:     Rate and Rhythm: Normal rate and  regular rhythm.     Heart sounds: Normal heart sounds.  Pulmonary:     Effort: Pulmonary effort is normal.     Breath sounds: Normal breath sounds. No wheezing or rhonchi.  Abdominal:     General: Bowel sounds are normal. There is no distension.     Palpations: Abdomen is soft.     Tenderness: There is no abdominal tenderness. There is no right CVA tenderness, left CVA tenderness or rebound.  Musculoskeletal:        General: Normal range of motion.     Cervical back: Normal range of motion and neck supple. No rigidity.     Right lower leg: No edema.     Left lower leg: No edema.  Skin:    General: Skin is warm and dry.  Neurological:     Mental Status: She is alert and oriented to person, place, and time.     Motor: No weakness.  Psychiatric:        Mood and Affect: Mood normal.        Behavior: Behavior normal.      CMP Latest Ref Rng & Units 01/19/2020  Glucose 70 - 99 mg/dL 377(H)  BUN 6 - 20 mg/dL 77(H)  Creatinine 0.44 - 1.00 mg/dL 18.91(H)  Sodium 135 - 145 mmol/L 131(L)  Potassium 3.5 - 5.1 mmol/L 4.3  Chloride 98 - 111 mmol/L 90(L)  CO2 22 - 32 mmol/L 23  Calcium 8.9 - 10.3 mg/dL 8.3(L)  Total Protein 6.5 - 8.1 g/dL 7.0  Total Bilirubin 0.3 - 1.2 mg/dL 0.6  Alkaline Phos 38 - 126 U/L 92  AST 15 - 41 U/L 12(L)  ALT 0 - 44 U/L 8   CBC Latest Ref Rng & Units 01/19/2020  WBC 4.0 - 10.5 K/uL 6.0  Hemoglobin 12.0 - 15.0 g/dL 9.4(L)  Hematocrit 36 - 46 % 27.4(L)  Platelets 150 - 400 K/uL 422(H)    No images are attached to the encounter.  CT Abdomen Pelvis Wo Contrast  Result Date: 01/19/2020 CLINICAL DATA:  Generalized abdominal pain. Vomiting. History of peritoneal dialysis. Appendectomy 6 weeks ago. EXAM: CT ABDOMEN AND PELVIS WITHOUT CONTRAST TECHNIQUE: Multidetector CT imaging of the abdomen and pelvis was performed following the standard protocol without IV contrast. COMPARISON:  CT 12/04/2019 FINDINGS: Lower chest: The lung bases are clear. Trace pericardial  effusion measures less than 5 mm. Hepatobiliary: Tiny subcentimeter hypodensity in the right dome of the liver is unchanged from prior exam, series 3, image 6. unremarkable gallbladder. No gallstones. No biliary dilatation. Pancreas: No ductal dilatation or inflammation. Spleen: Normal in size without focal abnormality. Adrenals/Urinary Tract: No adrenal nodule. No hydronephrosis or renal calculi. Mild bilateral renal parenchymal atrophy. No perinephric edema. Urinary bladder is nondistended. Stomach/Bowel: Lack of enteric contrast limits detailed bowel assessment. Stomach is partially distended. No obvious gastric wall thickening. There is no small bowel dilatation to suggest obstruction. No obvious small bowel wall thickening or inflammatory change. Appendectomy with chain sutures at the base of the cecum. No associated cecal wall thickening. Equivocal wall thickening of the ascending colon versus nondistention. Small volume of stool in the remainder of the colon without additional areas of colonic wall thickening. Vascular/Lymphatic: Normal caliber abdominal aorta. Prominent ileocolic nodes measuring up to 10 mm, likely reactive. Reproductive: Anteverted uterus. Small amount of air in the vaginal canal. No obvious adnexal mass. Other: Peritoneal dialysis catheter with tip coiled in the pelvis. There is a small amount of non organized free fluid in the pelvis. Small amount of free air and free fluid scattered in the upper abdomen that appears similar to prior exam and is likely related to peritoneal dialysis catheter. No organized abscess. No subcutaneous abscess. Musculoskeletal: Chronic trabecular thickening and coarsening involving the left hemipelvis from prior exam. No acute osseous abnormalities. IMPRESSION: 1. Equivocal wall thickening of the ascending colon versus nondistention. 2. Appendectomy without evidence of postoperative abscess. 3. Peritoneal dialysis catheter with tip coiled in the pelvis. There  is a small amount of non organized free fluid in the pelvis. Small amount of free air and free fluid scattered in the upper abdomen appears similar to prior exam and is likely related to peritoneal dialysis catheter. 4. Additional stable ancillary findings as described. Electronically Signed   By: Keith Rake M.D.   On: 01/19/2020 16:19    Assessment and plan- Patient is a 34 y.o. 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 Etiology of symptoms is unclear. Appendectomy for appendicitis in 8/21. She received Regeneron on 9/10 for covid infection. Symptoms improved but then reappeared. Question post-viral/post-covid etiology. Given history of HUS, will get CT a/p to evaluate further. Labs today Reviewed. Sodium slightly low at 131. She will see nephrology later today to evaluate peritoneal fluid for possible infection.  Addendum: reviewed CT results with patient which was unrevealing for source of her symptoms. Discussed with patient who felt symptoms were improving. Discussed with Dr. Rogue Bussing who didn't recommend treatment at this time. Suspect post covid. Follow up with Dr. Rogue Bussing. Plan to restart Ultimoris infusions. Patient advised to notify the clinic if there is no improvement in symptoms or if symptoms worsen in next 3-4 days.    Visit Diagnosis 1. Generalized abdominal pain   2. HUS (hemolytic uremic syndrome), atypical ([place])     Patient expressed understanding and was in agreement with this plan. She also understands that She can call clinic at any time with any questions, concerns, or complaints.   Thank you for allowing me to participate in the care of this very pleasant patient.   Beckey Rutter, DNP, AGNP-C [facility] [place]

## 2020-01-24 ENCOUNTER — Inpatient Hospital Stay: Payer: Medicare Other

## 2020-01-24 ENCOUNTER — Inpatient Hospital Stay (HOSPITAL_BASED_OUTPATIENT_CLINIC_OR_DEPARTMENT_OTHER): Payer: Medicare Other | Admitting: Internal Medicine

## 2020-01-24 ENCOUNTER — Other Ambulatory Visit: Payer: Self-pay

## 2020-01-24 ENCOUNTER — Encounter: Payer: Self-pay | Admitting: Internal Medicine

## 2020-01-24 DIAGNOSIS — D5939 Other hemolytic-uremic syndrome: Secondary | ICD-10-CM

## 2020-01-24 DIAGNOSIS — D593 Hemolytic-uremic syndrome: Secondary | ICD-10-CM

## 2020-01-24 LAB — COMPREHENSIVE METABOLIC PANEL
ALT: 9 U/L (ref 0–44)
AST: 11 U/L — ABNORMAL LOW (ref 15–41)
Albumin: 3.2 g/dL — ABNORMAL LOW (ref 3.5–5.0)
Alkaline Phosphatase: 81 U/L (ref 38–126)
Anion gap: 16 — ABNORMAL HIGH (ref 5–15)
BUN: 63 mg/dL — ABNORMAL HIGH (ref 6–20)
CO2: 24 mmol/L (ref 22–32)
Calcium: 7.8 mg/dL — ABNORMAL LOW (ref 8.9–10.3)
Chloride: 93 mmol/L — ABNORMAL LOW (ref 98–111)
Creatinine, Ser: 17.75 mg/dL — ABNORMAL HIGH (ref 0.44–1.00)
GFR calc Af Amer: 3 mL/min — ABNORMAL LOW (ref >60)
GFR calc non Af Amer: 2 mL/min — ABNORMAL LOW (ref >60)
Glucose, Bld: 259 mg/dL — ABNORMAL HIGH (ref 70–99)
Potassium: 3.8 mmol/L (ref 3.5–5.1)
Sodium: 133 mmol/L — ABNORMAL LOW (ref 135–145)
Total Bilirubin: 0.6 mg/dL (ref 0.3–1.2)
Total Protein: 6.3 g/dL — ABNORMAL LOW (ref 6.5–8.1)

## 2020-01-24 LAB — CBC WITH DIFFERENTIAL/PLATELET
Abs Immature Granulocytes: 0.02 10*3/uL (ref 0.00–0.07)
Basophils Absolute: 0.1 10*3/uL (ref 0.0–0.1)
Basophils Relative: 1 %
Eosinophils Absolute: 0.2 10*3/uL (ref 0.0–0.5)
Eosinophils Relative: 4 %
HCT: 24.2 % — ABNORMAL LOW (ref 36.0–46.0)
Hemoglobin: 8.4 g/dL — ABNORMAL LOW (ref 12.0–15.0)
Immature Granulocytes: 0 %
Lymphocytes Relative: 24 %
Lymphs Abs: 1.2 10*3/uL (ref 0.7–4.0)
MCH: 27.1 pg (ref 26.0–34.0)
MCHC: 34.7 g/dL (ref 30.0–36.0)
MCV: 78.1 fL — ABNORMAL LOW (ref 80.0–100.0)
Monocytes Absolute: 0.4 10*3/uL (ref 0.1–1.0)
Monocytes Relative: 9 %
Neutro Abs: 3.1 10*3/uL (ref 1.7–7.7)
Neutrophils Relative %: 62 %
Platelets: 322 10*3/uL (ref 150–400)
RBC: 3.1 MIL/uL — ABNORMAL LOW (ref 3.87–5.11)
RDW: 13.9 % (ref 11.5–15.5)
WBC: 5 10*3/uL (ref 4.0–10.5)
nRBC: 0 % (ref 0.0–0.2)

## 2020-01-24 LAB — LACTATE DEHYDROGENASE: LDH: 156 U/L (ref 98–192)

## 2020-01-24 MED ORDER — SODIUM CHLORIDE 0.9 % IV SOLN
INTRAVENOUS | Status: DC
  Filled 2020-01-24: qty 250

## 2020-01-24 MED ORDER — SODIUM CHLORIDE 0.9 % IV SOLN
3300.0000 mg | Freq: Once | INTRAVENOUS | Status: AC
  Administered 2020-01-24: 3300 mg via INTRAVENOUS
  Filled 2020-01-24: qty 33

## 2020-01-24 NOTE — Addendum Note (Signed)
Addended by: Delice Bison E on: 01/24/2020 03:34 PM   Modules accepted: Orders

## 2020-01-24 NOTE — Assessment & Plan Note (Addendum)
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  Stable.  Hb -8.8 [see below]  Platelets/LDH are normal.  Continue Ultimoris infusions every 8 weeks; continue indefinitely at this time.  STABLE.  # Anemia- multifactorial-8.3. [do not suspect secondary to atypical HUS]; likely secondary to ESRD  on procrit/Iron; patient's Procrit/iron delay secondary to acute issues.  Continue prn per nephrology.   # Abdominal pain-unclear etiology currently resolved.  CT scan unrevealing.  Monitor closely.  # ESRD- on PD- STABLE;  [awaiting transplant list].   # Hypertension-142/92-Overall STABLE; Recommend compliance with medications.  # Poorly controlled DM- 253; on insulin pump; STABLE  # DISPOSITION: # Treatment today # follow up in 8 weeks- MD- labs-cbc.cmp'LDH; haptologin; infusion-Ultomiris-Dr.B

## 2020-01-24 NOTE — Progress Notes (Signed)
[facility] NOTE  Patient Care Team: Rubie Maid, MD as PCP - General (Obstetrics and Gynecology) [name], FNP as Consulting Physician (Family Medicine) Azzie Glatter, MD as Consulting Physician (Internal Medicine) Sherryll Burger, MD as Consulting Physician (Nephrology) Cammie Sickle, MD as Consulting Physician (Internal Medicine)  CHIEF COMPLAINTS/PURPOSE OF CONSULTATION: Atypical HUS  # ATYPICAL HEMOLYTIC UREMIC SYNDROME [April 2019-Dx; [facility]; Dr.Arepally;s/p Plex x1; April 26th- Ecluzimab 1200 mg q 2W; march 25th 2020- Ultomiris q  8 weeks.   # AKI/CKD [name] 2018;FEB 2020- hemodialysis [[facility] in [place]; Tuesday Thursday Saturday]; May 2020-peritoneal dialysis  # IDDM [since age of 24- Dr.Kerr,/Endo]  # WORK UP for HUS [[facility]] Phospholipase A2 Receptor AB/ S Phospholipase A2 Receptor IFA-NEGATIVE   Oncology History   No history exists.   HISTORY OF PRESENTING ILLNESS:  Kelly Huff 34 y.o.  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  Patient was recently diagnosed with Covid infection.  Patient received monoclonal antibody therapy.  However patient was recently seen with symptomatic clinic for abdominal pain; nausea vomiting.  CT scan negative for any significant ascitic fluid/or any concerns for peritonitis.  Showed thickened ascending colon versus nondistention.  Patient unable to tolerate oral contrast.  S/p recent acute appendicitis- s/p appendectomy.  Evaluated by surgery yesterday.  No acute process.   Her abdominal discomfort is currently much improved.  No diarrhea.  No constipation.  Soft.  No fevers or chills.  Review of Systems  Constitutional: Positive for malaise/fatigue. Negative for chills, diaphoresis, fever and weight loss.  HENT: Negative for nosebleeds and sore throat.   Eyes: Negative for double  vision.  Respiratory: Negative for cough, hemoptysis, sputum production, shortness of breath and wheezing.   Cardiovascular: Negative for chest pain, palpitations, orthopnea and leg swelling.  Gastrointestinal: Negative for abdominal pain, blood in stool, constipation, diarrhea, heartburn, melena, nausea and vomiting.  Genitourinary: Negative for dysuria, frequency and urgency.  Musculoskeletal: Negative for back pain and joint pain.  Skin: Negative.  Negative for itching and rash.  Neurological: Negative for dizziness, tingling, focal weakness, weakness and headaches.  Endo/Heme/Allergies: Does not bruise/bleed easily.  Psychiatric/Behavioral: Negative for depression. The patient is not nervous/anxious and does not have insomnia.     MEDICAL HISTORY:  Past Medical History:  Diagnosis Date  . Chronic kidney disease   . Diabetes mellitus without complication ([place])   . History of pre-eclampsia 2016   mild  . Hypertension     SURGICAL HISTORY: Past Surgical History:  Procedure Laterality Date  . CESAREAN SECTION    . DILATION AND CURETTAGE OF UTERUS     x 4  . IR FLUORO GUIDE CV LINE RIGHT  12/05/2018  . IR FLUORO GUIDE CV LINE RIGHT  12/13/2018  . IR REMOVAL TUN CV CATH W/O FL  02/22/2019  . IR US GUIDE VASC ACCESS RIGHT  12/05/2018  . IR US GUIDE VASC ACCESS RIGHT  12/13/2018  . LAPAROSCOPIC APPENDECTOMY N/A 12/04/2019   Procedure: APPENDECTOMY LAPAROSCOPIC;  Surgeon: Donnie Mesa, MD;  Location: [facility];  Service: General;  Laterality: N/A;    SOCIAL HISTORY: Social History   Socioeconomic History  . Marital status: Married    Spouse name: Not on file  . Number of children: Not on file  . Years of education: Not on file  . Highest education level: Not on file  Occupational History  . Not on file  Tobacco Use  . Smoking status: Never Smoker  . Smokeless tobacco: Never Used  Vaping Use  . Vaping Use: Never used  Substance and Sexual Activity  . Alcohol use: No  . Drug  use: No  . Sexual activity: Yes    Birth control/protection: None, Surgical    Comment: tubial  Other Topics Concern  . Not on file  Social History Narrative  . Not on file   Social Determinants of Health   Financial Resource Strain:   . Difficulty of Paying Living Expenses: Not on file  Food Insecurity:   . Worried About Charity fundraiser in the Last Year: Not on file  . Ran Out of Food in the Last Year: Not on file  Transportation Needs:   . Lack of Transportation (Medical): Not on file  . Lack of Transportation (Non-Medical): Not on file  Physical Activity:   . Days of Exercise per Week: Not on file  . Minutes of Exercise per Session: Not on file  Stress:   . Feeling of Stress : Not on file  Social Connections:   . Frequency of Communication with Friends and Family: Not on file  . Frequency of Social Gatherings with Friends and Family: Not on file  . Attends Religious Services: Not on file  . Active Member of Clubs or Organizations: Not on file  . Attends Archivist Meetings: Not on file  . Marital Status: Not on file  Intimate Partner Violence:   . Fear of Current or Ex-Partner: Not on file  . Emotionally Abused: Not on file  . Physically Abused: Not on file  . Sexually Abused: Not on file    FAMILY HISTORY: Family History  Problem Relation Age of Onset  . Hypertension Mother   . Cancer Mother        Uterine vs cervical (pt unsure),   . Schizophrenia Brother   . Breast cancer Neg Hx     ALLERGIES:  is allergic to morphine, sulfamethoxazole-trimethoprim, and trulicity [dulaglutide].  MEDICATIONS:  Current Outpatient Medications  Medication Sig Dispense Refill  . acetaminophen (TYLENOL) 325 MG tablet Take 2 tablets (650 mg total) by mouth every 4 (four) hours as needed for mild pain or fever.    Marland Kitchen amLODipine (NORVASC) 10 MG tablet Take 10 mg by mouth every evening.     . calcium acetate (PHOSLO) 667 MG tablet Take 1,334 mg by mouth 3 (three) times  daily with meals.    . cloNIDine (CATAPRES - DOSED IN MG/24 HR) 0.1 mg/24hr patch Place 0.1 mg onto the skin See admin instructions. Apply patch on every Sunday    . cyclobenzaprine (FLEXERIL) 10 MG tablet Take 10 mg by mouth at bedtime as needed for muscle spasms.     Marland Kitchen docusate sodium (COLACE) 100 MG capsule Take 100 mg by mouth daily as needed for mild constipation.     Marland Kitchen gentamicin cream (GARAMYCIN) 0.1 % Apply 1 application topically See admin instructions. Apply small amount to catheter exit site once daily    . insulin aspart (NOVOLOG) 100 UNIT/ML injection Inject 7 Units into the skin 3 (three) times daily before meals.    . insulin glargine (LANTUS) 100 UNIT/ML injection Inject 15 Units into the skin daily.    . metoprolol succinate (TOPROL-XL) 100 MG 24 hr tablet Take 100 mg by mouth 2 (two) times daily.    Marland Kitchen oxyCODONE (OXY IR/ROXICODONE) 5 MG immediate release tablet Take 1 tablet (5 mg total) by mouth  every 6 (six) hours as needed for severe pain. 15 tablet 0  . polyethylene glycol (MIRALAX / GLYCOLAX) 17 g packet Take 17 g by mouth daily as needed for mild constipation. (Patient not taking: Reported on 01/24/2020) 14 each 0   No current facility-administered medications for this visit.   Facility-Administered Medications Ordered in Other Visits  Medication Dose Route Frequency Provider Last Rate Last Admin  . sodium chloride flush (NS) 0.9 % injection 10 mL  10 mL Intravenous PRN Cammie Sickle, MD       PHYSICAL EXAMINATION: ECOG PERFORMANCE STATUS: 1 - Symptomatic but completely ambulatory  Vitals:   01/24/20 0940  BP: (!) 144/92  Pulse: 78  Resp: 16  Temp: 98.3 F (36.8 C)  SpO2: 100%   Filed Weights   01/24/20 0940  Weight: 172 lb (78 kg)    Physical Exam Constitutional:      Comments: Patient is alone.  HENT:     Head: Normocephalic and atraumatic.     Mouth/Throat:     Pharynx: No oropharyngeal exudate.  Eyes:     Pupils: Pupils are equal, round, and  reactive to light.  Cardiovascular:     Rate and Rhythm: Normal rate and regular rhythm.  Pulmonary:     Effort: No respiratory distress.     Breath sounds: No wheezing.  Abdominal:     General: Bowel sounds are normal. There is no distension.     Palpations: Abdomen is soft. There is no mass.     Tenderness: There is no abdominal tenderness. There is no guarding or rebound.  Musculoskeletal:        General: No tenderness. Normal range of motion.     Cervical back: Normal range of motion and neck supple.  Skin:    General: Skin is warm.  Neurological:     Mental Status: She is alert and oriented to person, place, and time.  Psychiatric:        Mood and Affect: Affect normal.   ;  LABORATORY DATA:  I have reviewed the data as listed Lab Results  Component Value Date   WBC 5.0 01/24/2020   HGB 8.4 (L) 01/24/2020   HCT 24.2 (L) 01/24/2020   MCV 78.1 (L) 01/24/2020   PLT 322 01/24/2020   Recent Labs    12/04/19 1748 12/04/19 1748 12/05/19 0919 01/19/20 0954 01/24/20 0927  NA 134*   < > 133* 131* 133*  K 3.8   < > 4.3 4.3 3.8  CL 94*   < > 95* 90* 93*  CO2 25   < > 22 23 24   GLUCOSE 174*   < > 279* 377* 259*  BUN 49*   < > 54* 77* 63*  CREATININE 16.11*   < > 16.70* 18.91* 17.75*  CALCIUM 8.0*   < > 8.1* 8.3* 7.8*  GFRNONAA 3*   < > 2* 2* 2*  GFRAA 3*   < > 3* 2* 3*  PROT 5.8*  --   --  7.0 6.3*  ALBUMIN 3.0*  --   --  3.4* 3.2*  AST 9*  --   --  12* 11*  ALT 8  --   --  8 9  ALKPHOS 106  --   --  92 81  BILITOT 0.3  --   --  0.6 0.6   < > = values in this interval not displayed.    RADIOGRAPHIC STUDIES: I have personally reviewed the radiological images as listed  and agreed with the findings in the report. CT Abdomen Pelvis Wo Contrast  Result Date: 01/19/2020 CLINICAL DATA:  Generalized abdominal pain. Vomiting. History of peritoneal dialysis. Appendectomy 6 weeks ago. EXAM: CT ABDOMEN AND PELVIS WITHOUT CONTRAST TECHNIQUE: Multidetector CT imaging of the  abdomen and pelvis was performed following the standard protocol without IV contrast. COMPARISON:  CT 12/04/2019 FINDINGS: Lower chest: The lung bases are clear. Trace pericardial effusion measures less than 5 mm. Hepatobiliary: Tiny subcentimeter hypodensity in the right dome of the liver is unchanged from prior exam, series 3, image 6. unremarkable gallbladder. No gallstones. No biliary dilatation. Pancreas: No ductal dilatation or inflammation. Spleen: Normal in size without focal abnormality. Adrenals/Urinary Tract: No adrenal nodule. No hydronephrosis or renal calculi. Mild bilateral renal parenchymal atrophy. No perinephric edema. Urinary bladder is nondistended. Stomach/Bowel: Lack of enteric contrast limits detailed bowel assessment. Stomach is partially distended. No obvious gastric wall thickening. There is no small bowel dilatation to suggest obstruction. No obvious small bowel wall thickening or inflammatory change. Appendectomy with chain sutures at the base of the cecum. No associated cecal wall thickening. Equivocal wall thickening of the ascending colon versus nondistention. Small volume of stool in the remainder of the colon without additional areas of colonic wall thickening. Vascular/Lymphatic: Normal caliber abdominal aorta. Prominent ileocolic nodes measuring up to 10 mm, likely reactive. Reproductive: Anteverted uterus. Small amount of air in the vaginal canal. No obvious adnexal mass. Other: Peritoneal dialysis catheter with tip coiled in the pelvis. There is a small amount of non organized free fluid in the pelvis. Small amount of free air and free fluid scattered in the upper abdomen that appears similar to prior exam and is likely related to peritoneal dialysis catheter. No organized abscess. No subcutaneous abscess. Musculoskeletal: Chronic trabecular thickening and coarsening involving the left hemipelvis from prior exam. No acute osseous abnormalities. IMPRESSION: 1. Equivocal wall  thickening of the ascending colon versus nondistention. 2. Appendectomy without evidence of postoperative abscess. 3. Peritoneal dialysis catheter with tip coiled in the pelvis. There is a small amount of non organized free fluid in the pelvis. Small amount of free air and free fluid scattered in the upper abdomen appears similar to prior exam and is likely related to peritoneal dialysis catheter. 4. Additional stable ancillary findings as described. Electronically Signed   By: Keith Rake M.D.   On: 01/19/2020 16:19    ASSESSMENT & PLAN:   HUS (hemolytic uremic syndrome), atypical ([place]) # Atypical hemolytic uremic syndrome -on Ultimoris infusions.  Stable.  Hb -8.8 [see below]  Platelets/LDH are normal.  Continue Ultimoris infusions every 8 weeks; continue indefinitely at this time.  STABLE.  # Anemia- multifactorial-8.3. [do not suspect secondary to atypical HUS]; likely secondary to ESRD  on procrit/Iron; patient's Procrit/iron delay secondary to acute issues.  Continue prn per nephrology.   # Abdominal pain-unclear etiology currently resolved.  CT scan unrevealing.  Monitor closely.  # ESRD- on PD- STABLE;  [awaiting transplant list].   # Hypertension-142/92-Overall STABLE; Recommend compliance with medications.  # Poorly controlled DM- 253; on insulin pump; STABLE  # DISPOSITION: # Treatment today # follow up in 8 weeks- MD- labs-cbc.cmp'LDH; haptologin; infusion-Ultomiris-Dr.B  All questions were answered. The patient knows to call the clinic with any problems, questions or concerns.    Cammie Sickle, MD 01/24/2020 10:08 AM

## 2020-02-20 ENCOUNTER — Telehealth: Payer: Self-pay

## 2020-02-20 DIAGNOSIS — N186 End stage renal disease: Secondary | ICD-10-CM | POA: Insufficient documentation

## 2020-02-20 NOTE — Telephone Encounter (Signed)
Pt called in and stated that she had some test done over 2 months ago and hasn't been called about the results. The pt is requesting a call back. I told the pt  I will send a message and to please allow 24-48 hours for a reply. Please advise

## 2020-02-21 NOTE — Telephone Encounter (Signed)
Spoke to pt concerning her call to the office. Pt is aware that [facility] sent her a message on the test results back in Sept informing her that the results were negative. Pt requested a copy of her test results be mailed to the address on file.

## 2020-03-19 ENCOUNTER — Inpatient Hospital Stay: Payer: Medicare Other

## 2020-03-19 ENCOUNTER — Inpatient Hospital Stay: Payer: Medicare Other | Admitting: Internal Medicine

## 2020-03-20 ENCOUNTER — Emergency Department (HOSPITAL_COMMUNITY): Payer: Medicare Other

## 2020-03-20 ENCOUNTER — Encounter (HOSPITAL_COMMUNITY): Payer: Self-pay | Admitting: Emergency Medicine

## 2020-03-20 ENCOUNTER — Inpatient Hospital Stay (HOSPITAL_COMMUNITY)
Admission: EM | Admit: 2020-03-20 | Discharge: 2020-03-22 | DRG: 637 | Disposition: A | Discharge status: Home / Self Care | Payer: Medicare Other | Attending: Internal Medicine | Admitting: Internal Medicine

## 2020-03-20 ENCOUNTER — Other Ambulatory Visit: Payer: Self-pay

## 2020-03-20 DIAGNOSIS — E871 Hypo-osmolality and hyponatremia: Secondary | ICD-10-CM

## 2020-03-20 DIAGNOSIS — E441 Mild protein-calorie malnutrition: Secondary | ICD-10-CM | POA: Diagnosis present

## 2020-03-20 DIAGNOSIS — Z20822 Contact with and (suspected) exposure to covid-19: Secondary | ICD-10-CM | POA: Diagnosis present

## 2020-03-20 DIAGNOSIS — R112 Nausea with vomiting, unspecified: Secondary | ICD-10-CM

## 2020-03-20 DIAGNOSIS — R109 Unspecified abdominal pain: Secondary | ICD-10-CM

## 2020-03-20 DIAGNOSIS — E8809 Other disorders of plasma-protein metabolism, not elsewhere classified: Secondary | ICD-10-CM

## 2020-03-20 DIAGNOSIS — Z9641 Presence of insulin pump (external) (internal): Secondary | ICD-10-CM | POA: Diagnosis present

## 2020-03-20 DIAGNOSIS — Z882 Allergy status to sulfonamides status: Secondary | ICD-10-CM

## 2020-03-20 DIAGNOSIS — E1010 Type 1 diabetes mellitus with ketoacidosis without coma: Principal | ICD-10-CM | POA: Diagnosis present

## 2020-03-20 DIAGNOSIS — R1084 Generalized abdominal pain: Secondary | ICD-10-CM

## 2020-03-20 DIAGNOSIS — E103299 Type 1 diabetes mellitus with mild nonproliferative diabetic retinopathy without macular edema, unspecified eye: Secondary | ICD-10-CM | POA: Diagnosis present

## 2020-03-20 DIAGNOSIS — I120 Hypertensive chronic kidney disease with stage 5 chronic kidney disease or end stage renal disease: Secondary | ICD-10-CM | POA: Diagnosis present

## 2020-03-20 DIAGNOSIS — R197 Diarrhea, unspecified: Secondary | ICD-10-CM

## 2020-03-20 DIAGNOSIS — D631 Anemia in chronic kidney disease: Secondary | ICD-10-CM | POA: Diagnosis present

## 2020-03-20 DIAGNOSIS — Z888 Allergy status to other drugs, medicaments and biological substances status: Secondary | ICD-10-CM

## 2020-03-20 DIAGNOSIS — E1110 Type 2 diabetes mellitus with ketoacidosis without coma: Secondary | ICD-10-CM

## 2020-03-20 DIAGNOSIS — Z885 Allergy status to narcotic agent status: Secondary | ICD-10-CM

## 2020-03-20 DIAGNOSIS — E1022 Type 1 diabetes mellitus with diabetic chronic kidney disease: Secondary | ICD-10-CM | POA: Diagnosis present

## 2020-03-20 DIAGNOSIS — Z992 Dependence on renal dialysis: Secondary | ICD-10-CM

## 2020-03-20 DIAGNOSIS — E103219 Type 1 diabetes mellitus with mild nonproliferative diabetic retinopathy with macular edema, unspecified eye: Secondary | ICD-10-CM

## 2020-03-20 DIAGNOSIS — N186 End stage renal disease: Secondary | ICD-10-CM

## 2020-03-20 DIAGNOSIS — Z8249 Family history of ischemic heart disease and other diseases of the circulatory system: Secondary | ICD-10-CM

## 2020-03-20 DIAGNOSIS — E875 Hyperkalemia: Secondary | ICD-10-CM | POA: Diagnosis present

## 2020-03-20 HISTORY — DX: Type 2 diabetes mellitus with ketoacidosis without coma: E11.10

## 2020-03-20 LAB — BASIC METABOLIC PANEL
Anion gap: 17 — ABNORMAL HIGH (ref 5–15)
Anion gap: 18 — ABNORMAL HIGH (ref 5–15)
Anion gap: 16 — ABNORMAL HIGH (ref 5–15)
BUN: 75 mg/dL — ABNORMAL HIGH (ref 6–20)
BUN: 76 mg/dL — ABNORMAL HIGH (ref 6–20)
BUN: 76 mg/dL — ABNORMAL HIGH (ref 6–20)
CO2: 18 mmol/L — ABNORMAL LOW (ref 22–32)
CO2: 19 mmol/L — ABNORMAL LOW (ref 22–32)
CO2: 19 mmol/L — ABNORMAL LOW (ref 22–32)
Calcium: 7.7 mg/dL — ABNORMAL LOW (ref 8.9–10.3)
Calcium: 7.9 mg/dL — ABNORMAL LOW (ref 8.9–10.3)
Calcium: 7.6 mg/dL — ABNORMAL LOW (ref 8.9–10.3)
Chloride: 98 mmol/L (ref 98–111)
Chloride: 99 mmol/L (ref 98–111)
Chloride: 97 mmol/L — ABNORMAL LOW (ref 98–111)
Creatinine, Ser: 15.68 mg/dL — ABNORMAL HIGH (ref 0.44–1.00)
Creatinine, Ser: 15.76 mg/dL — ABNORMAL HIGH (ref 0.44–1.00)
Creatinine, Ser: 15.3 mg/dL — ABNORMAL HIGH (ref 0.44–1.00)
GFR, Estimated: 3 mL/min — ABNORMAL LOW (ref >60)
GFR, Estimated: 3 mL/min — ABNORMAL LOW (ref >60)
GFR, Estimated: 3 mL/min — ABNORMAL LOW (ref >60)
Glucose, Bld: 217 mg/dL — ABNORMAL HIGH (ref 70–99)
Glucose, Bld: 96 mg/dL (ref 70–99)
Glucose, Bld: 246 mg/dL — ABNORMAL HIGH (ref 70–99)
Potassium: 4.7 mmol/L (ref 3.5–5.1)
Potassium: 4.6 mmol/L (ref 3.5–5.1)
Potassium: 6.5 mmol/L (ref 3.5–5.1)
Sodium: 133 mmol/L — ABNORMAL LOW (ref 135–145)
Sodium: 136 mmol/L (ref 135–145)
Sodium: 132 mmol/L — ABNORMAL LOW (ref 135–145)

## 2020-03-20 LAB — COMPREHENSIVE METABOLIC PANEL
ALT: 9 U/L (ref 0–44)
AST: 9 U/L — ABNORMAL LOW (ref 15–41)
Albumin: 3.1 g/dL — ABNORMAL LOW (ref 3.5–5.0)
Alkaline Phosphatase: 123 U/L (ref 38–126)
Anion gap: 16 — ABNORMAL HIGH (ref 5–15)
BUN: 72 mg/dL — ABNORMAL HIGH (ref 6–20)
CO2: 16 mmol/L — ABNORMAL LOW (ref 22–32)
Calcium: 7.4 mg/dL — ABNORMAL LOW (ref 8.9–10.3)
Chloride: 95 mmol/L — ABNORMAL LOW (ref 98–111)
Creatinine, Ser: 16.18 mg/dL — ABNORMAL HIGH (ref 0.44–1.00)
GFR, Estimated: 3 mL/min — ABNORMAL LOW (ref >60)
Glucose, Bld: 623 mg/dL (ref 70–99)
Potassium: 6.4 mmol/L (ref 3.5–5.1)
Sodium: 127 mmol/L — ABNORMAL LOW (ref 135–145)
Total Bilirubin: 0.7 mg/dL (ref 0.3–1.2)
Total Protein: 6.2 g/dL — ABNORMAL LOW (ref 6.5–8.1)

## 2020-03-20 LAB — I-STAT VENOUS BLOOD GAS, ED
Acid-base deficit: 9 mmol/L — ABNORMAL HIGH (ref 0.0–2.0)
Bicarbonate: 18.3 mmol/L — ABNORMAL LOW (ref 20.0–28.0)
Calcium, Ion: 0.98 mmol/L — ABNORMAL LOW (ref 1.15–1.40)
HCT: 42 % (ref 36.0–46.0)
Hemoglobin: 14.3 g/dL (ref 12.0–15.0)
O2 Saturation: 39 %
Potassium: 6.1 mmol/L — ABNORMAL HIGH (ref 3.5–5.1)
Sodium: 131 mmol/L — ABNORMAL LOW (ref 135–145)
TCO2: 20 mmol/L — ABNORMAL LOW (ref 22–32)
pCO2, Ven: 43.1 mmHg — ABNORMAL LOW (ref 44.0–60.0)
pH, Ven: 7.236 — ABNORMAL LOW (ref 7.250–7.430)
pO2, Ven: 27 mmHg — CL (ref 32.0–45.0)

## 2020-03-20 LAB — I-STAT BETA HCG BLOOD, ED (MC, WL, AP ONLY): I-stat hCG, quantitative: <5 m[IU]/mL (ref <5)

## 2020-03-20 LAB — CBG MONITORING, ED
Glucose-Capillary: 568 mg/dL (ref 70–99)
Glucose-Capillary: 461 mg/dL — ABNORMAL HIGH (ref 70–99)
Glucose-Capillary: 509 mg/dL (ref 70–99)
Glucose-Capillary: 387 mg/dL — ABNORMAL HIGH (ref 70–99)
Glucose-Capillary: 449 mg/dL — ABNORMAL HIGH (ref 70–99)
Glucose-Capillary: 310 mg/dL — ABNORMAL HIGH (ref 70–99)
Glucose-Capillary: 209 mg/dL — ABNORMAL HIGH (ref 70–99)

## 2020-03-20 LAB — GLUCOSE, CAPILLARY
Glucose-Capillary: 166 mg/dL — ABNORMAL HIGH (ref 70–99)
Glucose-Capillary: 117 mg/dL — ABNORMAL HIGH (ref 70–99)
Glucose-Capillary: 114 mg/dL — ABNORMAL HIGH (ref 70–99)
Glucose-Capillary: 103 mg/dL — ABNORMAL HIGH (ref 70–99)
Glucose-Capillary: 120 mg/dL — ABNORMAL HIGH (ref 70–99)
Glucose-Capillary: 172 mg/dL — ABNORMAL HIGH (ref 70–99)
Glucose-Capillary: 187 mg/dL — ABNORMAL HIGH (ref 70–99)
Glucose-Capillary: 175 mg/dL — ABNORMAL HIGH (ref 70–99)
Glucose-Capillary: 245 mg/dL — ABNORMAL HIGH (ref 70–99)
Glucose-Capillary: 290 mg/dL — ABNORMAL HIGH (ref 70–99)
Glucose-Capillary: 251 mg/dL — ABNORMAL HIGH (ref 70–99)
Glucose-Capillary: 253 mg/dL — ABNORMAL HIGH (ref 70–99)

## 2020-03-20 LAB — CBC
HCT: 46.9 % — ABNORMAL HIGH (ref 36.0–46.0)
Hemoglobin: 14.7 g/dL (ref 12.0–15.0)
MCH: 27.9 pg (ref 26.0–34.0)
MCHC: 31.3 g/dL (ref 30.0–36.0)
MCV: 89 fL (ref 80.0–100.0)
Platelets: 366 10*3/uL (ref 150–400)
RBC: 5.27 MIL/uL — ABNORMAL HIGH (ref 3.87–5.11)
RDW: 14.7 % (ref 11.5–15.5)
WBC: 7.5 10*3/uL (ref 4.0–10.5)
nRBC: 0 % (ref 0.0–0.2)

## 2020-03-20 LAB — BETA-HYDROXYBUTYRIC ACID
Beta-Hydroxybutyric Acid: 1.33 mmol/L — ABNORMAL HIGH (ref 0.05–0.27)
Beta-Hydroxybutyric Acid: 0.08 mmol/L (ref 0.05–0.27)
Beta-Hydroxybutyric Acid: 0.18 mmol/L (ref 0.05–0.27)

## 2020-03-20 LAB — PHOSPHORUS: Phosphorus: 6 mg/dL — ABNORMAL HIGH (ref 2.5–4.6)

## 2020-03-20 LAB — LIPASE, BLOOD: Lipase: 38 U/L (ref 11–51)

## 2020-03-20 LAB — RESPIRATORY PANEL BY RT PCR (FLU A&B, COVID)
Influenza A by PCR: NEGATIVE
Influenza B by PCR: NEGATIVE
SARS Coronavirus 2 by RT PCR: NEGATIVE

## 2020-03-20 LAB — POTASSIUM
Potassium: 6 mmol/L — ABNORMAL HIGH (ref 3.5–5.1)
Potassium: 4.7 mmol/L (ref 3.5–5.1)
Potassium: 5.2 mmol/L — ABNORMAL HIGH (ref 3.5–5.1)

## 2020-03-20 MED ORDER — HEPARIN 1000 UNIT/ML FOR PERITONEAL DIALYSIS: 500.0000 [IU] | INTRAMUSCULAR | Status: DC | PRN

## 2020-03-20 MED ORDER — GENTAMICIN SULFATE 0.1 % EX CREA
1.0000 "application " | TOPICAL_CREAM | Freq: Every day | CUTANEOUS | Status: DC
  Administered 2020-03-21 – 2020-03-22 (×2): 1 via TOPICAL
  Filled 2020-03-20: qty 15

## 2020-03-20 MED ORDER — HYDROMORPHONE HCL 1 MG/ML IJ SOLN
1.0000 mg | Freq: Once | INTRAMUSCULAR | Status: AC
  Administered 2020-03-20: 1 mg via INTRAVENOUS
  Filled 2020-03-20: qty 1

## 2020-03-20 MED ORDER — DEXTROSE IN LACTATED RINGERS 5 % IV SOLN: INTRAVENOUS | Status: DC

## 2020-03-20 MED ORDER — HYDROMORPHONE HCL 1 MG/ML IJ SOLN: 1.0000 mg | INTRAMUSCULAR | Status: DC | PRN

## 2020-03-20 MED ORDER — ONDANSETRON HCL 4 MG/2ML IJ SOLN
4.0000 mg | Freq: Once | INTRAMUSCULAR | Status: AC
  Administered 2020-03-20: 4 mg via INTRAVENOUS
  Filled 2020-03-20: qty 2

## 2020-03-20 MED ORDER — HYDROMORPHONE HCL 1 MG/ML IJ SOLN
1.0000 mg | INTRAMUSCULAR | Status: DC | PRN
Start: 2020-03-20 — End: 2020-03-20

## 2020-03-20 MED ORDER — DEXTROSE 50 % IV SOLN
0.0000 mL | INTRAVENOUS | Status: DC | PRN
  Administered 2020-03-22: 50 mL via INTRAVENOUS
  Filled 2020-03-20: qty 50

## 2020-03-20 MED ORDER — DELFLEX-LC/2.5% DEXTROSE 394 MOSM/L IP SOLN: INTRAPERITONEAL | Status: DC

## 2020-03-20 MED ORDER — LACTATED RINGERS IV BOLUS: 20.0000 mL/kg | Freq: Once | INTRAVENOUS | Status: DC

## 2020-03-20 MED ORDER — LABETALOL HCL 5 MG/ML IV SOLN
20.0000 mg | Freq: Once | INTRAVENOUS | Status: AC
  Administered 2020-03-20: 20 mg via INTRAVENOUS
  Filled 2020-03-20: qty 4

## 2020-03-20 MED ORDER — PROCHLORPERAZINE EDISYLATE 10 MG/2ML IJ SOLN
10.0000 mg | INTRAMUSCULAR | Status: DC | PRN
  Administered 2020-03-20: 10 mg via INTRAVENOUS
  Filled 2020-03-20 (×2): qty 2

## 2020-03-20 MED ORDER — HEPARIN 1000 UNIT/ML FOR PERITONEAL DIALYSIS
INTRAPERITONEAL | Status: DC | PRN
  Filled 2020-03-20: qty 5000

## 2020-03-20 MED ORDER — HEPARIN SODIUM (PORCINE) 5000 UNIT/ML IJ SOLN
5000.0000 [IU] | Freq: Three times a day (TID) | INTRAMUSCULAR | Status: DC
  Administered 2020-03-20 – 2020-03-22 (×5): 5000 [IU] via SUBCUTANEOUS
  Filled 2020-03-20 (×5): qty 1

## 2020-03-20 MED ORDER — SODIUM ZIRCONIUM CYCLOSILICATE 10 G PO PACK
10.0000 g | PACK | Freq: Once | ORAL | Status: AC
  Administered 2020-03-20: 10 g via ORAL
  Filled 2020-03-20: qty 1

## 2020-03-20 MED ORDER — DEXTROSE-NACL 5-0.45 % IV SOLN: INTRAVENOUS | Status: DC

## 2020-03-20 MED ORDER — INSULIN REGULAR(HUMAN) IN NACL 100-0.9 UT/100ML-% IV SOLN
INTRAVENOUS | Status: DC
  Administered 2020-03-20: 7.5 [IU]/h via INTRAVENOUS
  Administered 2020-03-21: 0.9 [IU]/h via INTRAVENOUS
  Administered 2020-03-21: 2.6 [IU]/h via INTRAVENOUS
  Filled 2020-03-20 (×3): qty 100

## 2020-03-20 MED ORDER — ONDANSETRON HCL 4 MG/2ML IJ SOLN
4.0000 mg | Freq: Four times a day (QID) | INTRAMUSCULAR | Status: DC | PRN
  Administered 2020-03-20 – 2020-03-22 (×4): 4 mg via INTRAVENOUS
  Filled 2020-03-20 (×5): qty 2

## 2020-03-20 MED ORDER — HYDROMORPHONE HCL 1 MG/ML IJ SOLN
0.5000 mg | INTRAMUSCULAR | Status: DC | PRN
  Administered 2020-03-20: 0.5 mg via INTRAVENOUS
  Filled 2020-03-20: qty 1

## 2020-03-20 MED ORDER — HYDROMORPHONE HCL 1 MG/ML IJ SOLN
1.0000 mg | INTRAMUSCULAR | Status: DC | PRN
  Administered 2020-03-20 – 2020-03-21 (×5): 1 mg via INTRAVENOUS
  Filled 2020-03-20 (×6): qty 1

## 2020-03-20 MED ORDER — LACTATED RINGERS IV SOLN: INTRAVENOUS | Status: DC

## 2020-03-20 NOTE — ED Notes (Signed)
Patient transported to CT 

## 2020-03-20 NOTE — Progress Notes (Addendum)
Patient potassium 6.5  RN notified provider on call. New orders received

## 2020-03-20 NOTE — ED Provider Notes (Addendum)
[place] EMERGENCY DEPARTMENT Provider Note   CSN: [id number] Arrival date & time: 03/20/20  0105     History Chief Complaint  Patient presents with  . Abdominal Pain  . Hyperglycemia    Kelly Huff is a 34 y.o. female.  Patient with past medical history notable for type 1 diabetes, ESRD on peritoneal dialysis, HUS, TTP, presents to the emergency department with a chief complaint of nausea, vomiting, and diarrhea. She states that the symptoms started 3 days ago. She denies having had any fever or cough. She denies any known sick contacts. She states that she has pain in the center of her abdomen. She is concerned it may be her catheter. Denies any treatments prior to arrival. She rates her pain is moderate to severe.   Abdominal Pain Hyperglycemia Associated symptoms: abdominal pain        Past Medical History:  Diagnosis Date  . Chronic kidney disease   . Diabetes mellitus without complication ([place])   . History of pre-eclampsia 2016   mild  . Hypertension     Patient Active Problem List   Diagnosis Date Noted  . COVID-19 virus infection 01/09/2020  . Acute appendicitis 12/04/2019  . Hyperkalemia 12/13/2018  . Peritoneal dialysis catheter dysfunction ([place]) 12/03/2018  . PD catheter dysfunction ([place]) 12/03/2018  . Acute encephalopathy 09/03/2018  . Hypertensive urgency 09/03/2018  . Enteritis 07/27/2018  . ESRD on dialysis ([place]) 07/27/2018  . Hypertension associated with diabetes ([place]) 07/27/2018  . 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 Elevated troponin 08/14/2017  . CKD (chronic kidney disease) stage 3, GFR 30-59 ml/min ([facility]) 08/14/2017  . Acquired hemolytic anemia ([place]) 08/04/2017  . Diabetes ([place]) 04/13/2017  . Acute renal failure ([place])   . Family history of BRCA gene positive 07/13/2013  . 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 CESAREAN SECTION    . DILATION AND CURETTAGE OF UTERUS     x 4  . IR FLUORO GUIDE CV LINE RIGHT  12/05/2018  . IR FLUORO GUIDE CV LINE RIGHT  12/13/2018  . IR REMOVAL TUN CV CATH W/O FL  02/22/2019  . IR US GUIDE VASC ACCESS RIGHT  12/05/2018  . IR US GUIDE VASC ACCESS RIGHT  12/13/2018  . LAPAROSCOPIC APPENDECTOMY N/A 12/04/2019   Procedure: APPENDECTOMY LAPAROSCOPIC;  Surgeon: Donnie Mesa, MD;  Location: [facility];  Service: General;  Laterality: N/A;     OB History    Gravida  5   Para  3   Term  2   Preterm  1   AB  2   Living  3     SAB      TAB  0   Ectopic      Multiple      Live Births  3           Family History  Problem Relation Age of Onset  . Hypertension Mother   . Cancer Mother        Uterine vs cervical (pt unsure),   . Schizophrenia Brother   . Breast cancer Neg Hx     Social History   Tobacco Use  . Smoking status: Never Smoker  . Smokeless tobacco: Never Used  Vaping Use  . Vaping Use: Never used  Substance Use Topics  . Alcohol use:  No  . Drug use: No    Home Medications Prior to Admission medications   Medication Sig Start Date End Date Taking? Authorizing Provider  acetaminophen (TYLENOL) 325 MG tablet Take 2 tablets (650 mg total) by mouth every 4 (four) hours as needed for mild pain or fever. 12/05/19   Meuth, Brooke A, PA-C  amLODipine (NORVASC) 10 MG tablet Take 10 mg by mouth every evening.  11/06/18   [provider]  calcium acetate (PHOSLO) 667 MG tablet Take 1,334 mg by mouth 3 (three) times daily with meals. 11/05/18   [provider]  cloNIDine (CATAPRES - DOSED IN MG/24 HR) 0.1 mg/24hr patch Place 0.1 mg onto the skin See admin instructions. Apply patch on every Sunday 10/10/18   [provider]  cyclobenzaprine (FLEXERIL) 10 MG tablet Take 10 mg by mouth at bedtime as needed for muscle spasms.     [provider]  docusate sodium (COLACE) 100 MG capsule Take 100 mg by mouth daily as needed for mild constipation.     [provider]  gentamicin cream (GARAMYCIN) 0.1 % Apply 1 application topically See admin instructions. Apply small amount to catheter exit site once daily 09/07/18   [provider]  insulin aspart (NOVOLOG) 100 UNIT/ML injection Inject 7 Units into the skin 3 (three) times daily before meals.    [provider]  insulin glargine (LANTUS) 100 UNIT/ML injection Inject 15 Units into the skin daily.    [provider]  metoprolol succinate (TOPROL-XL) 100 MG 24 hr tablet Take 100 mg by mouth 2 (two) times daily. 10/18/18   [provider]  oxyCODONE (OXY IR/ROXICODONE) 5 MG immediate release tablet Take 1 tablet (5 mg total) by mouth every 6 (six) hours as needed for severe pain. 12/05/19   Meuth, Brooke A, PA-C  polyethylene glycol (MIRALAX / GLYCOLAX) 17 g packet Take 17 g by mouth daily as needed for mild constipation. Patient not taking: Reported on 01/24/2020 12/05/19   Wellington Hampshire, PA-C    Allergies    Morphine, Sulfamethoxazole-trimethoprim, and Trulicity [dulaglutide]  Review of Systems   Review of Systems  Gastrointestinal: Positive for abdominal pain.  All other systems reviewed and are negative.   Physical Exam Updated Vital Signs BP (!) 158/109 (BP Location: Right Arm)   Pulse 93   Temp (!) 97.5 F (36.4 C) (Oral)   Resp 16   Ht '5\' 7"'  (1.702 m)   Wt 82 kg   SpO2 99%   BMI 28.31 kg/m   Physical Exam Vitals and nursing note reviewed.  Constitutional:      General: She is not in acute distress.    Appearance: She is well-developed.  HENT:     Head: Normocephalic and atraumatic.  Eyes:     Conjunctiva/sclera: Conjunctivae normal.  Cardiovascular:     Rate and Rhythm: Normal rate and regular rhythm.     Heart sounds: No murmur heard.   Pulmonary:     Effort: Pulmonary effort is normal. No respiratory  distress.     Breath sounds: Normal breath sounds.  Abdominal:     Palpations: Abdomen is soft.     Tenderness: There is abdominal tenderness.  Musculoskeletal:        General: Normal range of motion.     Cervical back: Neck supple.  Skin:    General: Skin is warm and dry.  Neurological:     Mental Status: She is alert and oriented to person, place, and  time.  Psychiatric:        Mood and Affect: Mood normal.     ED Results / Procedures / Treatments   Labs (all labs ordered are listed, but only abnormal results are displayed) Labs Reviewed  COMPREHENSIVE METABOLIC PANEL - Abnormal; Notable for the following components:      Result Value   Sodium 127 (*)    Potassium 6.4 (*)    Chloride 95 (*)    CO2 16 (*)    Glucose, Bld 623 (*)    BUN 72 (*)    Creatinine, Ser 16.18 (*)    Calcium 7.4 (*)    Total Protein 6.2 (*)    Albumin 3.1 (*)    AST 9 (*)    GFR, Estimated 3 (*)    Anion gap 16 (*)    All other components within normal limits  CBC - Abnormal; Notable for the following components:   RBC 5.27 (*)    HCT 46.9 (*)    All other components within normal limits  CBG MONITORING, ED - Abnormal; Notable for the following components:   Glucose-Capillary 568 (*)    All other components within normal limits  CBG MONITORING, ED - Abnormal; Notable for the following components:   Glucose-Capillary 509 (*)    All other components within normal limits  CBG MONITORING, ED - Abnormal; Notable for the following components:   Glucose-Capillary 461 (*)    All other components within normal limits  RESPIRATORY PANEL BY RT PCR (FLU A&B, COVID)  LIPASE, BLOOD  URINALYSIS, ROUTINE W REFLEX MICROSCOPIC  BETA-HYDROXYBUTYRIC ACID  BETA-HYDROXYBUTYRIC ACID  BETA-HYDROXYBUTYRIC ACID  I-STAT BETA HCG BLOOD, ED ([facility], [facility], [facility] ONLY)  I-STAT VENOUS BLOOD GAS, ED    EKG None  Radiology CT ABDOMEN PELVIS WO CONTRAST  Result Date: 03/20/2020 CLINICAL DATA:  Abdominal pain with  diarrhea EXAM: CT ABDOMEN AND PELVIS WITHOUT CONTRAST TECHNIQUE: Multidetector CT imaging of the abdomen and pelvis was performed following the standard protocol without IV contrast. COMPARISON:  None. FINDINGS: LOWER CHEST: Normal. HEPATOBILIARY: Small amount of anterior perihepatic free fluid. The gallbladder is normal. PANCREAS: Normal pancreas. No ductal dilatation or peripancreatic fluid collection. SPLEEN: Normal. ADRENALS/URINARY TRACT: The adrenal glands are normal. No hydronephrosis, nephroureterolithiasis or solid renal mass. The urinary bladder is normal for degree of distention STOMACH/BOWEL: There is no hiatal hernia. Normal duodenal course and caliber. No small bowel dilatation or inflammation. No focal colonic abnormality. Status post appendectomy. There is mesenteric edema in the upper abdomen. VASCULAR/LYMPHATIC: Normal course and caliber of the major abdominal vessels. No abdominal or pelvic lymphadenopathy. REPRODUCTIVE: 4.1 cm left adnexal cyst.  Normal uterus. MUSCULOSKELETAL. No bony spinal canal stenosis or focal osseous abnormality. OTHER: Small amount of free fluid in the pelvis. IMPRESSION: 1. Mesenteric edema in the upper abdomen may be due to volume overload in patient on peritoneal dialysis. Enteritis is also a possibility. 2. Small volume perihepatic and pelvic free fluid is not unexpected in the setting of peritoneal dialysis. 3. 4.1 cm left adnexal cyst. No follow-up imaging recommended. Note: This recommendation does not apply to premenarchal patients and to those with increased risk (genetic, family history, elevated tumor markers or other high-risk factors) of ovarian cancer. Reference: JACR 2020 Feb; 17(2):248-254 Electronically Signed   By: Ulyses Jarred M.D.   On: 03/20/2020 02:42    Procedures .Critical Care Performed by: Montine Circle, PA-C Authorized by: Montine Circle, PA-C   Critical care provider statement:    Critical care  time (minutes):  50   Critical  care was necessary to treat or prevent imminent or life-threatening deterioration of the following conditions:  Metabolic crisis   Critical care was time spent personally by me on the following activities:  Discussions with consultants, evaluation of patient's response to treatment, examination of patient, ordering and performing treatments and interventions, ordering and review of laboratory studies, ordering and review of radiographic studies, pulse oximetry, re-evaluation of patient's condition, obtaining history from patient or surrogate and review of old charts   (including critical care time)  Medications Ordered in ED Medications  HYDROmorphone (DILAUDID) injection 1 mg (has no administration in time range)  ondansetron (ZOFRAN) injection 4 mg (has no administration in time range)    ED Course  I have reviewed the triage vital signs and the nursing notes.  Pertinent labs & imaging results that were available during my care of the patient were reviewed by me and considered in my medical decision making (see chart for details).    MDM Rules/Calculators/A&P                          This patient complains of nausea, vomiting, and abdominal pain, this involves an extensive number of treatment options, and is a complaint that carries with it a high risk of complications and morbidity.    Differential Dx DKA, gastroenteritis, pyelonephritis, Covid  Pertinent Labs I ordered, reviewed, and interpreted labs, which included CBC, CMP, CBG, notable for sodium 127, potassium 6.4, chloride 95, CO2 16, anion gap 16, GFR 3, and glucose 623.  Lipase and pregnancy test are negative.   Imaging Interpretation I ordered imaging studies which included CT abdomen/pelvis, which showed mesenteric edema could be consistent with volume overload versus enteritis.   Medications I ordered medication Dilaudid, Zofran for pain and nausea.  Notified by Dr. Leonette Monarch that he placed orders for DKA and  hyperkalemia when he was notified of patient's test results.  Patient turned off her insulin pump.  Sources Previous records obtained and reviewed no recent admissions for DKA.   Critical Interventions  Insulin infusion for DKA.  Reassessments After the interventions stated above, I reevaluated the patient and found feeling improved.  Consultants Dr. Josephine Cables, [facility], who is appreciated for admitting to the hospital. Dr. Carolin Sicks, nephrology, who gives recs including hold further fluid, continue insulin, treat hyper K with the insulin and Lokelma (patient has already received [facility]), no emergent dialysis at this point.  Patient will be seen by the day team.  Plan Admission to stepdown unit.    Final Clinical Impression(s) / ED Diagnoses Final diagnoses:  Diabetic ketoacidosis without coma associated with type 1 diabetes mellitus ([place])  Hyperkalemia    Rx / DC Orders ED Discharge Orders    None       Montine Circle, PA-C 03/20/20 [place], PA-C 03/20/20 0452    Fatima Blank, MD 03/20/20 [phone]

## 2020-03-20 NOTE — Progress Notes (Signed)
1714 PD initiated. Dressing changed with no s/s of infection. Pt denies any complaints.

## 2020-03-20 NOTE — Consult Note (Addendum)
Reason for Consult: To manage dialysis and dialysis related needs Referring Physician: Dr Colleen Can is an 34 y.o. female.   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 Benny Lennert for eval and recs re: management of ESRD and provision of PD.    Pt presented to the hospital with a 4 day history of abd pain/ n/v/d.  She tried to stay home as long as possible but had to some in as her symptoms were too bad.  Here, she was found to have hyperglycemia/ elevated AG/ elevated beta- hydroxybutyric acid consistent with DKA.  Started on insulin gtt. K initially 6.0, got Lokelma and repeat K 4.7.  CT abd/ pelvis noted mesenteric edema in the upper abdomen.   In this setting we are asked to see.  She noted that prior to admission she noticed that her insulin pump came out of her [place] and was not administering the proper dose of insulin.  PD fluid has been clear.  Denies constipation.  No f/c, rashes, exit site problems/ tunnel problems.  She is still recovering from appendicitis she had in August and says she is using 2100 fill volumes instead of 2200 fill volumes.    Dialyzes [facility] on PD 6 exchanges 2.2L fill vol 1 hr 30 min dwell time EDW 78.5   Past Medical History:  Diagnosis Date  . Chronic kidney disease   . Diabetes mellitus without complication ([place])   . History of pre-eclampsia 2016   mild  . Hypertension     Past Surgical History:  Procedure Laterality Date  . CESAREAN SECTION    . DILATION AND CURETTAGE OF UTERUS     x 4  . IR FLUORO GUIDE CV LINE RIGHT  12/05/2018  . IR FLUORO GUIDE CV LINE RIGHT  12/13/2018  . IR REMOVAL TUN CV CATH W/O FL  02/22/2019  . IR US GUIDE VASC ACCESS RIGHT  12/05/2018  . IR US GUIDE VASC ACCESS RIGHT  12/13/2018  . LAPAROSCOPIC APPENDECTOMY N/A 12/04/2019   Procedure: APPENDECTOMY LAPAROSCOPIC;  Surgeon: Donnie Mesa, MD;  Location: [facility] OR;  Service: General;  Laterality: N/A;    Family History  Problem Relation Age of Onset  . Hypertension Mother   . Cancer Mother        Uterine vs cervical (pt unsure),   . Schizophrenia Brother   . Breast cancer Neg Hx     Social History:  reports that she has never smoked. She has never used smokeless tobacco. She reports that she does not drink alcohol and does not use drugs.  Allergies:  Allergies  Allergen Reactions  . Morphine Itching and Other (See Comments)    Hallucination  . Sulfamethoxazole-Trimethoprim Hives  . Trulicity [Dulaglutide] Other (See Comments)    She is DM Type 1. Was prescribed but damaged her kidneys    Medications:  Scheduled: . heparin  5,000 Units Subcutaneous Q8H    Results for orders placed or performed during the hospital encounter of 03/20/20 (from the past 48 hour(s))  Lipase, blood     Status: None   Collection Time: 03/20/20  1:13 AM  Result Value Ref Range   Lipase 38 11 - 51 U/L    Comment: Performed at [facility], [place] [address] [place], [place] [address]  Comprehensive metabolic panel     Status: Abnormal   Collection Time: 03/20/20  1:13 AM  Result Value Ref Range   Sodium 127 (L) 135 - 145 mmol/L   Potassium 6.4 (HH) 3.5 - 5.1 mmol/L    Comment: CRITICAL RESULT CALLED TO, READ BACK BY AND VERIFIED WITH: RN G TATE @0223  03/20/20 BY S GEZAHEGN    Chloride 95 (L) 98 - 111 mmol/L   CO2 16 (L) 22 - 32 mmol/L   Glucose, Bld 623 (HH) 70 - 99 mg/dL    Comment: Glucose reference range applies only to samples taken after fasting for at least 8 hours. CRITICAL RESULT CALLED TO, READ BACK BY AND VERIFIED WITH: RN G TATE @0223  03/20/20 BY S GEZAHEGN    BUN 72 (H) 6 - 20 mg/dL   Creatinine, Ser 16.18 (H) 0.44 - 1.00 mg/dL   Calcium 7.4 (L) 8.9 - 10.3 mg/dL   Total Protein 6.2 (L) 6.5 - 8.1 g/dL   Albumin 3.1 (L) 3.5 - 5.0 g/dL   AST 9 (L) 15 - 41 U/L   ALT 9 0 - 44 U/L   Alkaline Phosphatase 123  38 - 126 U/L   Total Bilirubin 0.7 0.3 - 1.2 mg/dL   GFR, Estimated 3 (L) >60 mL/min    Comment: (NOTE) Calculated using the CKD-EPI Creatinine Equation (2021)    Anion gap 16 (H) 5 - 15    Comment: Performed at [facility], [address], [place], [place] [address]  CBC     Status: Abnormal   Collection Time: 03/20/20  1:13 AM  Result Value Ref Range   WBC 7.5 4.0 - 10.5 K/uL   RBC 5.27 (H) 3.87 - 5.11 MIL/uL   Hemoglobin 14.7 12.0 - 15.0 g/dL   HCT 46.9 (H) 36 - 46 %   MCV 89.0 80.0 - 100.0 fL   MCH 27.9 26.0 - 34.0 pg   MCHC 31.3 30.0 - 36.0 g/dL   RDW 14.7 11.5 - 15.5 %   Platelets 366 150 - 400 K/uL   nRBC 0.0 0.0 - 0.2 %    Comment: Performed at [facility], [place] [address] [place], [place] [address]  I-Stat beta hCG blood, ED     Status: None   Collection Time: 03/20/20  1:20 AM  Result Value Ref Range   I-stat hCG, quantitative <5.0 <5 mIU/mL   Comment 3            Comment:   GEST. AGE      CONC.  (mIU/mL)   <=1 WEEK        5 - 50     2 WEEKS       50 - 500     3 WEEKS       100 - 10,000     4 WEEKS     1,000 - 30,000        FEMALE AND NON-PREGNANT FEMALE:     LESS THAN 5 mIU/mL   CBG monitoring, ED     Status: Abnormal   Collection Time: 03/20/20  3:09 AM  Result Value Ref Range   Glucose-Capillary 568 (HH) 70 - 99 mg/dL    Comment: Glucose reference range applies only to samples taken after fasting for at least 8 hours.  CBG monitoring, ED     Status: Abnormal   Collection Time: 03/20/20  3:44 AM  Result Value Ref Range   Glucose-Capillary 509 (HH) 70 - 99 mg/dL  Comment: Glucose reference range applies only to samples taken after fasting for at least 8 hours.  CBG monitoring, ED     Status: Abnormal   Collection Time: 03/20/20  4:17 AM  Result Value Ref Range   Glucose-Capillary 461 (H) 70 - 99 mg/dL    Comment: Glucose reference range applies only to samples taken after fasting for at least 8 hours.  Beta-hydroxybutyric acid     Status:  Abnormal   Collection Time: 03/20/20  4:25 AM  Result Value Ref Range   Beta-Hydroxybutyric Acid 1.33 (H) 0.05 - 0.27 mmol/L    Comment: Performed at [facility] [address] [place], [place] [address]  Respiratory Panel by RT PCR (Flu A&B, Covid) - Nasopharyngeal Swab     Status: None   Collection Time: 03/20/20  4:26 AM   Specimen: Nasopharyngeal Swab; Nasopharyngeal(NP) swabs in vial transport medium  Result Value Ref Range   SARS Coronavirus 2 by RT PCR NEGATIVE NEGATIVE    Comment: (NOTE) SARS-CoV-2 target nucleic acids are NOT DETECTED.  The SARS-CoV-2 RNA is generally detectable in upper respiratoy specimens during the acute phase of infection. The lowest concentration of SARS-CoV-2 viral copies this assay can detect is 131 copies/mL. A negative result does not preclude SARS-Cov-2 infection and should not be used as the sole basis for treatment or other patient management decisions. A negative result may occur with  improper specimen collection/handling, submission of specimen other than nasopharyngeal swab, presence of viral mutation(s) within the areas targeted by this assay, and inadequate number of viral copies (<131 copies/mL). A negative result must be combined with clinical observations, patient history, and epidemiological information. The expected result is Negative.  Fact Sheet for Patients:  PinkCheek.be  Fact Sheet for Healthcare Providers:  GravelBags.it  This test is no t yet approved or cleared by the [place] FDA and  has been authorized for detection and/or diagnosis of SARS-CoV-2 by FDA under an Emergency Use Authorization (EUA). This EUA will remain  in effect (meaning this test can be used) for the duration of the COVID-19 declaration under Section 564(b)(1) of the Act, 21 U.S.C. section 360bbb-3(b)(1), unless the authorization is terminated or revoked sooner.     Influenza A  by PCR NEGATIVE NEGATIVE   Influenza B by PCR NEGATIVE NEGATIVE    Comment: (NOTE) The Xpert Xpress SARS-CoV-2/FLU/RSV assay is intended as an aid in  the diagnosis of influenza from Nasopharyngeal swab specimens and  should not be used as a sole basis for treatment. Nasal washings and  aspirates are unacceptable for Xpert Xpress SARS-CoV-2/FLU/RSV  testing.  Fact Sheet for Patients: PinkCheek.be  Fact Sheet for Healthcare Providers: GravelBags.it  This test is not yet approved or cleared by the [place] FDA and  has been authorized for detection and/or diagnosis of SARS-CoV-2 by  FDA under an Emergency Use Authorization (EUA). This EUA will remain  in effect (meaning this test can be used) for the duration of the  Covid-19 declaration under Section 564(b)(1) of the Act, 21  U.S.C. section 360bbb-3(b)(1), unless the authorization is  terminated or revoked. Performed at [facility], [place] [address], [place], [place] [address]   I-Stat Venous Blood Gas, ED     Status: Abnormal   Collection Time: 03/20/20  4:35 AM  Result Value Ref Range   pH, Ven 7.236 (L) 7.25 - 7.43   pCO2, Ven 43.1 (L) 44 - 60 mmHg   pO2, Ven 27.0 (LL) 32 -  45 mmHg   Bicarbonate 18.3 (L) 20.0 - 28.0 mmol/L   TCO2 20 (L) 22 - 32 mmol/L   O2 Saturation 39.0 %   Acid-base deficit 9.0 (H) 0.0 - 2.0 mmol/L   Sodium 131 (L) 135 - 145 mmol/L   Potassium 6.1 (H) 3.5 - 5.1 mmol/L   Calcium, Ion 0.98 (L) 1.15 - 1.40 mmol/L   HCT 42.0 36 - 46 %   Hemoglobin 14.3 12.0 - 15.0 g/dL   Sample type VENOUS    Comment NOTIFIED PHYSICIAN   CBG monitoring, ED     Status: Abnormal   Collection Time: 03/20/20  4:45 AM  Result Value Ref Range   Glucose-Capillary 449 (H) 70 - 99 mg/dL    Comment: Glucose reference range applies only to samples taken after fasting for at least 8 hours.  CBG monitoring, ED     Status: Abnormal   Collection Time: 03/20/20  5:15  AM  Result Value Ref Range   Glucose-Capillary 387 (H) 70 - 99 mg/dL    Comment: Glucose reference range applies only to samples taken after fasting for at least 8 hours.  Potassium     Status: Abnormal   Collection Time: 03/20/20  5:20 AM  Result Value Ref Range   Potassium 6.0 (H) 3.5 - 5.1 mmol/L    Comment: Performed at [facility] [address], [place], [place] [address]  CBG monitoring, ED     Status: Abnormal   Collection Time: 03/20/20  6:20 AM  Result Value Ref Range   Glucose-Capillary 310 (H) 70 - 99 mg/dL    Comment: Glucose reference range applies only to samples taken after fasting for at least 8 hours.  CBG monitoring, ED     Status: Abnormal   Collection Time: 03/20/20  7:22 AM  Result Value Ref Range   Glucose-Capillary 209 (H) 70 - 99 mg/dL    Comment: Glucose reference range applies only to samples taken after fasting for at least 8 hours.   Comment 1 Notify RN    Comment 2 Document in Chart   Potassium     Status: None   Collection Time: 03/20/20  7:58 AM  Result Value Ref Range   Potassium 4.7 3.5 - 5.1 mmol/L    Comment: Performed at [facility], [address] [place], [place] [address]  Basic metabolic panel     Status: Abnormal   Collection Time: 03/20/20  7:58 AM  Result Value Ref Range   Sodium 133 (L) 135 - 145 mmol/L   Potassium 4.7 3.5 - 5.1 mmol/L   Chloride 98 98 - 111 mmol/L   CO2 18 (L) 22 - 32 mmol/L   Glucose, Bld 217 (H) 70 - 99 mg/dL    Comment: Glucose reference range applies only to samples taken after fasting for at least 8 hours.   BUN 75 (H) 6 - 20 mg/dL   Creatinine, Ser 15.68 (H) 0.44 - 1.00 mg/dL   Calcium 7.7 (L) 8.9 - 10.3 mg/dL   GFR, Estimated 3 (L) >60 mL/min    Comment: (NOTE) Calculated using the CKD-EPI Creatinine Equation (2021)    Anion gap 17 (H) 5 - 15    Comment: Performed at [facility] [address], [place], [place] [address]  Phosphorus     Status: Abnormal   Collection Time:  03/20/20  7:58 AM  Result Value Ref Range   Phosphorus 6.0 (H) 2.5 - 4.6 mg/dL  Comment: Performed at [facility], [place] [address] [place], [place] [address]  Glucose, capillary     Status: Abnormal   Collection Time: 03/20/20  8:38 AM  Result Value Ref Range   Glucose-Capillary 166 (H) 70 - 99 mg/dL    Comment: Glucose reference range applies only to samples taken after fasting for at least 8 hours.  Glucose, capillary     Status: Abnormal   Collection Time: 03/20/20 10:06 AM  Result Value Ref Range   Glucose-Capillary 117 (H) 70 - 99 mg/dL    Comment: Glucose reference range applies only to samples taken after fasting for at least 8 hours.  Glucose, capillary     Status: Abnormal   Collection Time: 03/20/20 11:18 AM  Result Value Ref Range   Glucose-Capillary 114 (H) 70 - 99 mg/dL    Comment: Glucose reference range applies only to samples taken after fasting for at least 8 hours.    CT ABDOMEN PELVIS WO CONTRAST  Result Date: 03/20/2020 CLINICAL DATA:  Abdominal pain with diarrhea EXAM: CT ABDOMEN AND PELVIS WITHOUT CONTRAST TECHNIQUE: Multidetector CT imaging of the abdomen and pelvis was performed following the standard protocol without IV contrast. COMPARISON:  None. FINDINGS: LOWER CHEST: Normal. HEPATOBILIARY: Small amount of anterior perihepatic free fluid. The gallbladder is normal. PANCREAS: Normal pancreas. No ductal dilatation or peripancreatic fluid collection. SPLEEN: Normal. ADRENALS/URINARY TRACT: The adrenal glands are normal. No hydronephrosis, nephroureterolithiasis or solid renal mass. The urinary bladder is normal for degree of distention STOMACH/BOWEL: There is no hiatal hernia. Normal duodenal course and caliber. No small bowel dilatation or inflammation. No focal colonic abnormality. Status post appendectomy. There is mesenteric edema in the upper abdomen. VASCULAR/LYMPHATIC: Normal course and caliber of the major abdominal vessels. No abdominal or pelvic  lymphadenopathy. REPRODUCTIVE: 4.1 cm left adnexal cyst.  Normal uterus. MUSCULOSKELETAL. No bony spinal canal stenosis or focal osseous abnormality. OTHER: Small amount of free fluid in the pelvis. IMPRESSION: 1. Mesenteric edema in the upper abdomen may be due to volume overload in patient on peritoneal dialysis. Enteritis is also a possibility. 2. Small volume perihepatic and pelvic free fluid is not unexpected in the setting of peritoneal dialysis. 3. 4.1 cm left adnexal cyst. No follow-up imaging recommended. Note: This recommendation does not apply to premenarchal patients and to those with increased risk (genetic, family history, elevated tumor markers or other high-risk factors) of ovarian cancer. Reference: JACR 2020 Feb; 17(2):248-254 Electronically Signed   By: Ulyses Jarred M.D.   On: 03/20/2020 02:42    ROS: all other systems reviewed and are negative except as per HPI Blood pressure (!) 157/104, pulse 82, temperature 98.2 F (36.8 C), temperature source Oral, resp. rate 16, height 5\' 7"  (1.702 m), weight 82 kg, SpO2 100 %, unknown if currently breastfeeding. .  GEN: NAD, lying in bed HEENT EOMI PERRL  NECK + JVD PULM clear bilaterally CV RRR no m/r/g ABD soft, overall nontender, no tunnel tenderness, exit site dressed.  PD cath in the R side EXT trace bilateral LE edema NEURO AAO x 3 SKIN no rashes  Assessment/Plan: 1 n/v/d: ? If DKA or infectious symptom.  C diff and GI pathogen panel ordered and pending. No WBC ct CT abd overall benign.  Per primary 2 ESRD:  On PD, will provide tonight, using all 2.5%.  Will do cell ct and culture to r/o peritonitis 3 Hypertension: on amlodipine and losartan at home, will UF as above 4. Anemia of ESRD:  Monitor,  ESA as needed 5. 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 DKA: on insulin gtt, per primary.   8.  Dispo: admitted.  Madelon Lips 03/20/2020, 11:34 AM

## 2020-03-20 NOTE — Progress Notes (Addendum)
Inpatient Diabetes Program Recommendations  AACE/ADA: New Consensus Statement on Inpatient Glycemic Control (2015)  Target Ranges:  Prepandial:   less than 140 mg/dL      Peak postprandial:   less than 180 mg/dL (1-2 hours)      Critically ill patients:  140 - 180 mg/dL   Results for Kelly Huff, Kelly Huff (MRN [id number]) as of 03/20/2020 05:54  Ref. Range 03/20/2020 01:13  Sodium Latest Ref Range: 135 - 145 mmol/L 127 (L)  Potassium Latest Ref Range: 3.5 - 5.1 mmol/L 6.4 (HH)  Chloride Latest Ref Range: 98 - 111 mmol/L 95 (L)  CO2 Latest Ref Range: 22 - 32 mmol/L 16 (L)  Glucose Latest Ref Range: 70 - 99 mg/dL 623 (HH)  BUN Latest Ref Range: 6 - 20 mg/dL 72 (H)  Creatinine Latest Ref Range: 0.44 - 1.00 mg/dL 16.18 (H)  Calcium Latest Ref Range: 8.9 - 10.3 mg/dL 7.4 (L)  Anion gap Latest Ref Range: 5 - 15  16 (H)   Results for Kelly Huff, Kelly Huff (MRN [id number]) as of 03/20/2020 05:54  Ref. Range 03/20/2020 03:09 03/20/2020 03:44 03/20/2020 04:17 03/20/2020 04:45 03/20/2020 05:15  Glucose-Capillary Latest Ref Range: 70 - 99 mg/dL 568 (HH)  IV Insulin Drip 509 (HH)  IV Insulin Drip 461 (H)  IV Insulin Drip 449 (H)  IV Insulin Drip 387 (H)  IV Insulin Drip   Results for Kelly Huff, Kelly Huff (MRN [id number]) as of 03/20/2020 07:43  Ref. Range 03/20/2020 04:25  Beta-Hydroxybutyric Acid Latest Ref Range: 0.05 - 0.27 mmol/L 1.33 (H)    Admit with: DKA/ Hyperkalemia  History: Type 1 Diabetes, ESRD (PD at home)  Home DM Meds: Insulin Pump       Was taking Lantus 15 units Daily + Novolog 7 units TID with meals back in August prior to starting Insulin Pump  Current Orders: IV Insulin Drip    Met with patient at bedside this afternoon.  Pt continues to have significant abdominal pain.  Discussed with patient diagnosis of DKA (pathophysiology), treatment of DKA, lab results, and transition plan to SQ insulin regimen.  Patient told me she took her insulin pump off last night and stated she  thought the insertions site cannula may have been dislodged (which could have been the cause of her DKA).  Does not have any insulin pump supplies at bedside (hospital does not carry pump supplies) and cannot recall her insulin pump settings at this time.  Sees Dr. Buddy Duty with [facility].  I discussed with pt that if her family can bring more pump supplies to the hospital that we can have her resume her insulin pump when the MD allows transition to SQ Insulin.  Otherwise, without insulin pump supplies, we will have to transition to SQ Lantus and Novolog regimen.  Pt OK with using Lantus and Novolog until she goes home.  I have called pt's ENDO office requesting call back to review most recent insulin pump settings.  Patient will need Lantus dose prior to d/c of IV Insulin drip when ready.  Spoke with Dr. Cindra Eves office (ENDO).  Spoke with Dr. Buddy Duty directly.  Dr. Buddy Duty told me they didn't have the most recent pump settings on file but did have pump settings from 2019.  Per Dr. Buddy Duty, total basal rate was 13 units (0.55 units/hr), Carb Ratio was 1 unit for every 17 grams carbohydrates, and Correction factor was 1 unit for every 65 mg/dl above target CBG (Target CBG 100 mg/dl)   MD- When patient  is ready to transition to SQ Insulin, can do 1 of 2 options:  #1 option--If patient gets her family to bring her extra Insulin pump supplies, we can have her resume her home insulin pump (will need to use all new supplies--hospital does not carry pump supplies). When ready to transition, have pt resume insulin pump and continue IV Insulin drip for 1 hour after pump restarted and then can d/c IV Insulin Drip  2nd option--If patient cannot get fresh insulin pump supplies, would transition to Lantus and Novolog when pt ready to transition to SQ Insulin (see above for info from Dr. Cindra Eves office). Would transition to the following: Start Lantus 15 units Daily (make sure to give 1st dose 1 hour before IV Insulin Drip  stopped) Start Novolog Sensitive Correction Scale/ SSI (0-9 units) TID AC + HS Start Novolog Meal Coverage: Novolog 3 units TID with meals     --Will follow patient during hospitalization--  Wyn Quaker RN, MSN, CDE Diabetes Coordinator Inpatient Glycemic Control Team Team Pager: [phone] (8a-5p)

## 2020-03-20 NOTE — ED Triage Notes (Signed)
Patient arrived with EMS from home reports mid abdominal pain with emesis and diarrhea since Sunday , CBG= 428 , she did not complete her peritoneal dialysis at home this evening .

## 2020-03-20 NOTE — H&P (Signed)
History and Physical  Kelly Huff YNW:[id number] DOB: 1985-11-09 DOA: 03/20/2020  Referring physician: Montine Circle, PA-C PCP: Rubie Maid, MD  Patient coming from: Home  Chief Complaint: Nausea, vomiting, diarrhea and elevated blood glucose level  HPI: Kelly Huff is a 34 y.o. female with medical history significant for type 1 diabetes, ESRD on peritoneal dialysis, HUS, TTP who presents to the emergency department due to 4-day onset of nausea, vomiting and diarrhea.  This was initially associated with abdominal pain but this has resolved.  Vomiting was several episodes daily, it was nonbilious and nonbloody with last episode PTA.  Diarrhea was also of watery nature and several times in a day with last episode while in ED waiting room.  Last dialysis was on Monday due to need to have to interrupt the dialysis to use the restroom due to the recurrent diarrhea.  She denies fever, cough, sick contact and denies treatment prior to coming to the ED.  ED Course:  In the emergency department, she was hemodynamically stable.  Work-up in the ED showed normocytic anemia, hyponatremia, hyperkalemia, BUN/creatinine 72/16.18, hyperglycemia, albumin 3.1.  Respiratory panel for influenza A, B and SARS coronavirus 2 was negative.  CT abdomen and pelvis showed mesenteric edema in the upper abdomen which may be due to volume overload in patient on peritoneal dialysis.  Enteritis was also considered to be a possibility.  She was treated with IV Dilaudid due to abdominal pain, IV Zofran due to vomiting, Lokelma due to hyperkalemia was given.  Patient was initially started on IV hydration and Endo tool, but dehydration was stopped after the ED PA consulted with nephrology regarding care and it was recommended for patient to be admitted to hospitalist service and that there is currently no indication for dialysis.  Review of Systems: Constitutional: Negative for chills and fever.  HENT: Negative for ear  pain and sore throat.   Eyes: Negative for pain and visual disturbance.  Respiratory: Negative for cough, chest tightness and shortness of breath.   Cardiovascular: Negative for chest pain and palpitations.  Gastrointestinal: Positive for nausea, vomiting, diarrhea, abdominal pain.   Endocrine: Negative for polyphagia and polyuria.  Genitourinary: Positive for decreased urine volume,  negative for dysuria, enuresis, hematuria Musculoskeletal: Negative for arthralgias and back pain.  Skin: Negative for color change and rash.  Allergic/Immunologic: Negative for immunocompromised state.  Neurological: Negative for tremors, syncope, speech difficulty, weakness, light-headedness and headaches.  Hematological: Does not bruise/bleed easily.  All other systems reviewed and are negative    Past Medical History:  Diagnosis Date  . Chronic kidney disease   . Diabetes mellitus without complication ([place])   . History of pre-eclampsia 2016   mild  . Hypertension    Past Surgical History:  Procedure Laterality Date  . CESAREAN SECTION    . DILATION AND CURETTAGE OF UTERUS     x 4  . IR FLUORO GUIDE CV LINE RIGHT  12/05/2018  . IR FLUORO GUIDE CV LINE RIGHT  12/13/2018  . IR REMOVAL TUN CV CATH W/O FL  02/22/2019  . IR US GUIDE VASC ACCESS RIGHT  12/05/2018  . IR US GUIDE VASC ACCESS RIGHT  12/13/2018  . LAPAROSCOPIC APPENDECTOMY N/A 12/04/2019   Procedure: APPENDECTOMY LAPAROSCOPIC;  Surgeon: Donnie Mesa, MD;  Location: [facility];  Service: General;  Laterality: N/A;    Social History:  reports that she has never smoked. She has never used smokeless tobacco. She reports that she does not drink alcohol and does  not use drugs.   Allergies  Allergen Reactions  . Morphine Itching and Other (See Comments)    Hallucination  . Sulfamethoxazole-Trimethoprim Hives  . Trulicity [Dulaglutide] Other (See Comments)    She is DM Type 1. Was prescribed but damaged her kidneys    Family History  Problem  Relation Age of Onset  . Hypertension Mother   . Cancer Mother        Uterine vs cervical (pt unsure),   . Schizophrenia Brother   . Breast cancer Neg Hx     Prior to Admission medications   Medication Sig Start Date End Date Taking? Authorizing Provider  acetaminophen (TYLENOL) 325 MG tablet Take 2 tablets (650 mg total) by mouth every 4 (four) hours as needed for mild pain or fever. 12/05/19  Yes Meuth, Brooke A, PA-C  amLODipine (NORVASC) 10 MG tablet Take 10 mg by mouth every evening.  11/06/18  Yes [provider]  calcium acetate (PHOSLO) 667 MG tablet Take 2,001 mg by mouth 3 (three) times daily with meals.  11/05/18  Yes [provider]  cloNIDine (CATAPRES - DOSED IN MG/24 HR) 0.1 mg/24hr patch Place 0.1 mg onto the skin See admin instructions. Apply patch on every Sunday 10/10/18  Yes [provider]  cyclobenzaprine (FLEXERIL) 10 MG tablet Take 10 mg by mouth at bedtime as needed for muscle spasms.    Yes [provider]  gentamicin cream (GARAMYCIN) 0.1 % Apply 1 application topically See admin instructions. Apply small amount to catheter exit site once daily 09/07/18  Yes [provider]  insulin lispro (HUMALOG) 100 UNIT/ML injection Inject into the skin See admin instructions. Insulin pump   Yes [provider]  losartan (COZAAR) 50 MG tablet Take 50 mg by mouth daily. 02/19/20  Yes [provider]  metoprolol succinate (TOPROL-XL) 100 MG 24 hr tablet Take 100 mg by mouth 2 (two) times daily. 10/18/18  Yes [provider]  calcitRIOL (ROCALTROL) 0.25 MCG capsule Take 0.25 mcg by mouth daily. 02/28/20   [provider]  cinacalcet (SENSIPAR) 30 MG tablet Take 30 mg by mouth daily. 03/14/20   [provider]  oxyCODONE (OXY IR/ROXICODONE) 5 MG immediate release tablet Take 1 tablet (5 mg total) by mouth every 6 (six) hours as needed for severe pain. Patient not taking: Reported on 03/20/2020 12/05/19    Wellington Hampshire, PA-C    Physical Exam: BP (!) 174/113   Pulse 92   Temp (!) 97.5 F (36.4 C) (Oral)   Resp 16   Ht 5\' 7"  (1.702 m)   Wt 82 kg   SpO2 100%   BMI 28.31 kg/m   . General: 34 y.o. year-old female well developed well nourished in no acute distress.  Alert and oriented x3. . Cardiovascular: Regular rate and rhythm with no rubs or gallops.  No thyromegaly or JVD noted.  No lower extremity edema. 2/4 pulses in all 4 extremities. Marland Kitchen Respiratory: Clear to auscultation with no wheezes or rales. Good inspiratory effort. . Abdomen: Soft tender to palpation of abdomen.  Nondistended with normal bowel sounds x4 quadrants. . Muskuloskeletal: No cyanosis, clubbing or edema noted bilaterally . Neuro: CN II-XII intact, strength, sensation, reflexes . Skin: No ulcerative lesions noted or rashes . Psychiatry: Judgement and insight appear normal. Mood is appropriate for condition and setting          Labs on Admission:  Basic Metabolic Panel: Recent Labs  Lab 03/20/20 0113 03/20/20 0435  NA  127* 131*  K 6.4* 6.1*  CL 95*  --   CO2 16*  --   GLUCOSE 623*  --   BUN 72*  --   CREATININE 16.18*  --   CALCIUM 7.4*  --    Liver Function Tests: Recent Labs  Lab 03/20/20 0113  AST 9*  ALT 9  ALKPHOS 123  BILITOT 0.7  PROT 6.2*  ALBUMIN 3.1*   Recent Labs  Lab 03/20/20 0113  LIPASE 38   No results for input(s): AMMONIA in the last 168 hours. CBC: Recent Labs  Lab 03/20/20 0113 03/20/20 0435  WBC 7.5  --   HGB 14.7 14.3  HCT 46.9* 42.0  MCV 89.0  --   PLT 366  --    Cardiac Enzymes: No results for input(s): CKTOTAL, CKMB, CKMBINDEX, TROPONINI in the last 168 hours.  BNP (last 3 results) No results for input(s): BNP in the last 8760 hours.  ProBNP (last 3 results) No results for input(s): PROBNP in the last 8760 hours.  CBG: Recent Labs  Lab 03/20/20 0344 03/20/20 0417 03/20/20 0445 03/20/20 0515 03/20/20 0620  GLUCAP 509* 461* 449* 387* 310*     Radiological Exams on Admission: CT ABDOMEN PELVIS WO CONTRAST  Result Date: 03/20/2020 CLINICAL DATA:  Abdominal pain with diarrhea EXAM: CT ABDOMEN AND PELVIS WITHOUT CONTRAST TECHNIQUE: Multidetector CT imaging of the abdomen and pelvis was performed following the standard protocol without IV contrast. COMPARISON:  None. FINDINGS: LOWER CHEST: Normal. HEPATOBILIARY: Small amount of anterior perihepatic free fluid. The gallbladder is normal. PANCREAS: Normal pancreas. No ductal dilatation or peripancreatic fluid collection. SPLEEN: Normal. ADRENALS/URINARY TRACT: The adrenal glands are normal. No hydronephrosis, nephroureterolithiasis or solid renal mass. The urinary bladder is normal for degree of distention STOMACH/BOWEL: There is no hiatal hernia. Normal duodenal course and caliber. No small bowel dilatation or inflammation. No focal colonic abnormality. Status post appendectomy. There is mesenteric edema in the upper abdomen. VASCULAR/LYMPHATIC: Normal course and caliber of the major abdominal vessels. No abdominal or pelvic lymphadenopathy. REPRODUCTIVE: 4.1 cm left adnexal cyst.  Normal uterus. MUSCULOSKELETAL. No bony spinal canal stenosis or focal osseous abnormality. OTHER: Small amount of free fluid in the pelvis. IMPRESSION: 1. Mesenteric edema in the upper abdomen may be due to volume overload in patient on peritoneal dialysis. Enteritis is also a possibility. 2. Small volume perihepatic and pelvic free fluid is not unexpected in the setting of peritoneal dialysis. 3. 4.1 cm left adnexal cyst. No follow-up imaging recommended. Note: This recommendation does not apply to premenarchal patients and to those with increased risk (genetic, family history, elevated tumor markers or other high-risk factors) of ovarian cancer. Reference: JACR 2020 Feb; 17(2):248-254 Electronically Signed   By: Ulyses Jarred M.D.   On: 03/20/2020 02:42    EKG: I independently viewed the EKG done and my findings are  as followed: Sinus rhythm at a rate of 92 bpm with biatrial enlargement  Assessment/Plan Present on Admission: . DKA (diabetic ketoacidosis) ([place]) . 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 ([facility])   ESRD on dialysis ([place])   Hyperkalemia   DKA (diabetic ketoacidosis) ([facility])   Hyponatremia   Hypoalbuminemia   Abdominal pain   Nausea & vomiting   Diarrhea     Anion gap metabolic acidosis secondary to DKA Hyperglycemia secondary to poorly controlled type 1 diabetes mellitus CBG showed pH 7.236,  PCO2 43.1, PO2 27, bicarb 18.3 Beta hydroxybutyrate pending Continue insulin drip per Endo tool Hold off on IV potassium at this time until K+ < 5.3 Continue serial BMP  Continue to monitor for anion gap closure prior to transitioning patient to subcu insulin Continue NPO  Nausea, vomiting, diarrhea and abdominal pain Continue IV Zofran 4 mg every 6 hours as needed  Continue IV morphine 2 mg every 4 hours as needed for moderate/severe pain C. difficile and GI panel will be checked  Pseudohyponatremia Na 127, corrected sodium level based on CBG (623)= 135 Continue to monitor sodium level with morning labs  Hyperkalemia K+ 6.4; no EKG changes, Lokelma given Continue IV insulin drip Continue to monitor sodium level with morning labs  ESRD on PD Last peritoneal dialysis was on Monday (11/22) No indication for hemodialysis at this time per nephrology consulted by ED PA PhosLo and calcitriol will be continued when patient resumes oral intake  Essential hypertension Continue home meds when patient resumes oral intake  Hypoalbuminemia secondary to mild protein calorie malnutrition Albumin 3.1, oral supplement will be provided when patient resumes oral intake (currently n.p.o.)  DVT prophylaxis: Heparin subcu  Code Status: Full code  Family  Communication: None at bedside  Disposition Plan:  Patient is from:                        home Anticipated DC to:                   home Anticipated DC date:                1 day Anticipated DC barriers:           Patient is unstable to be discharged at this time due to nausea, vomiting, diarrhea and abdominal pain and DKA requiring inpatient management.   Consults called: None  Admission status: Observation    Bernadette Hoit MD [facility] Password [facility]  03/20/2020, 6:29 AM

## 2020-03-20 NOTE — ED Provider Notes (Signed)
Attestation: Medical screening examination/treatment/procedure(s) were conducted as a shared visit with non-physician practitioner(s) and myself.  I personally evaluated the patient during the encounter.   Briefly, the patient is a 34 y.o. female with h/o T1DM and ESRD on PD, here for N/VD and abd pain.   Vitals:   03/20/20 0315 03/20/20 0330  BP: (!) 167/110 (!) 192/114  Pulse: 88 93  Resp: 16 14  Temp:    SpO2: 100% 100%    CONSTITUTIONAL:  ill-appearing, NAD NEURO:  Alert and oriented x 3, no focal deficits EYES:  pupils equal and reactive ENT/NECK:  trachea midline, no JVD CARDIO:  reg rate, reg rhythm, well-perfused PULM:  None labored breathing GI/GU:  Abdomen non-distended MSK/SPINE:  No gross deformities, no edema SKIN:  no rash, atraumatic PSYCH:  Appropriate speech and behavior   EKG Interpretation  Date/Time:  Wednesday March 20 2020 04:27:43 EST Ventricular Rate:  92 PR Interval:    QRS Duration: 87 QT Interval:  365 QTC Calculation: 452 R Axis:   73 Text Interpretation: Sinus rhythm Biatrial enlargement No acute changes Confirmed by Addison Lank [phone]) on 03/20/2020 4:52:38 AM       Work up notable for DKA with hyperkalemia. Insulin gtt started. IVF given. lokelma ordered. Admitted for further management.    .Critical Care Performed by: Fatima Blank, MD Authorized by: Fatima Blank, MD    CRITICAL CARE Performed by: Grayce Sessions Jeanet Lupe Total critical care time: 10 minutes Critical care time was exclusive of separately billable procedures and treating other patients. Critical care was necessary to treat or prevent imminent or life-threatening deterioration. Critical care was time spent personally by me on the following activities: development of treatment plan with patient and/or surrogate as well as nursing, discussions with consultants, evaluation of patient's response to treatment, examination of patient, obtaining history  from patient or surrogate, ordering and performing treatments and interventions, ordering and review of laboratory studies, ordering and review of radiographic studies, pulse oximetry and re-evaluation of patient's condition.       Fatima Blank, MD 03/20/20 [phone]

## 2020-03-21 DIAGNOSIS — E875 Hyperkalemia: Secondary | ICD-10-CM | POA: Diagnosis present

## 2020-03-21 DIAGNOSIS — E1010 Type 1 diabetes mellitus with ketoacidosis without coma: Principal | ICD-10-CM

## 2020-03-21 DIAGNOSIS — I120 Hypertensive chronic kidney disease with stage 5 chronic kidney disease or end stage renal disease: Secondary | ICD-10-CM | POA: Diagnosis present

## 2020-03-21 DIAGNOSIS — D631 Anemia in chronic kidney disease: Secondary | ICD-10-CM | POA: Diagnosis present

## 2020-03-21 DIAGNOSIS — Z882 Allergy status to sulfonamides status: Secondary | ICD-10-CM | POA: Diagnosis not present

## 2020-03-21 DIAGNOSIS — E441 Mild protein-calorie malnutrition: Secondary | ICD-10-CM | POA: Diagnosis present

## 2020-03-21 DIAGNOSIS — Z888 Allergy status to other drugs, medicaments and biological substances status: Secondary | ICD-10-CM | POA: Diagnosis not present

## 2020-03-21 DIAGNOSIS — E1022 Type 1 diabetes mellitus with diabetic chronic kidney disease: Secondary | ICD-10-CM | POA: Diagnosis present

## 2020-03-21 DIAGNOSIS — Z885 Allergy status to narcotic agent status: Secondary | ICD-10-CM | POA: Diagnosis not present

## 2020-03-21 DIAGNOSIS — Z20822 Contact with and (suspected) exposure to covid-19: Secondary | ICD-10-CM | POA: Diagnosis present

## 2020-03-21 DIAGNOSIS — N186 End stage renal disease: Secondary | ICD-10-CM | POA: Diagnosis present

## 2020-03-21 DIAGNOSIS — E103299 Type 1 diabetes mellitus with mild nonproliferative diabetic retinopathy without macular edema, unspecified eye: Secondary | ICD-10-CM | POA: Diagnosis present

## 2020-03-21 DIAGNOSIS — Z992 Dependence on renal dialysis: Secondary | ICD-10-CM | POA: Diagnosis not present

## 2020-03-21 DIAGNOSIS — E871 Hypo-osmolality and hyponatremia: Secondary | ICD-10-CM | POA: Diagnosis present

## 2020-03-21 DIAGNOSIS — Z8249 Family history of ischemic heart disease and other diseases of the circulatory system: Secondary | ICD-10-CM | POA: Diagnosis not present

## 2020-03-21 DIAGNOSIS — R197 Diarrhea, unspecified: Secondary | ICD-10-CM | POA: Diagnosis not present

## 2020-03-21 DIAGNOSIS — R1084 Generalized abdominal pain: Secondary | ICD-10-CM | POA: Diagnosis not present

## 2020-03-21 DIAGNOSIS — Z9641 Presence of insulin pump (external) (internal): Secondary | ICD-10-CM | POA: Diagnosis present

## 2020-03-21 LAB — COMPREHENSIVE METABOLIC PANEL
ALT: 7 U/L (ref 0–44)
AST: 10 U/L — ABNORMAL LOW (ref 15–41)
Albumin: 2.4 g/dL — ABNORMAL LOW (ref 3.5–5.0)
Alkaline Phosphatase: 83 U/L (ref 38–126)
Anion gap: 14 (ref 5–15)
BUN: 64 mg/dL — ABNORMAL HIGH (ref 6–20)
CO2: 21 mmol/L — ABNORMAL LOW (ref 22–32)
Calcium: 7.8 mg/dL — ABNORMAL LOW (ref 8.9–10.3)
Chloride: 99 mmol/L (ref 98–111)
Creatinine, Ser: 13.21 mg/dL — ABNORMAL HIGH (ref 0.44–1.00)
GFR, Estimated: 3 mL/min — ABNORMAL LOW (ref >60)
Glucose, Bld: 167 mg/dL — ABNORMAL HIGH (ref 70–99)
Potassium: 4 mmol/L (ref 3.5–5.1)
Sodium: 134 mmol/L — ABNORMAL LOW (ref 135–145)
Total Bilirubin: 0.3 mg/dL (ref 0.3–1.2)
Total Protein: 5.1 g/dL — ABNORMAL LOW (ref 6.5–8.1)

## 2020-03-21 LAB — BASIC METABOLIC PANEL
Anion gap: 13 (ref 5–15)
Anion gap: 16 — ABNORMAL HIGH (ref 5–15)
BUN: 68 mg/dL — ABNORMAL HIGH (ref 6–20)
BUN: 62 mg/dL — ABNORMAL HIGH (ref 6–20)
CO2: 21 mmol/L — ABNORMAL LOW (ref 22–32)
CO2: 19 mmol/L — ABNORMAL LOW (ref 22–32)
Calcium: 7.8 mg/dL — ABNORMAL LOW (ref 8.9–10.3)
Calcium: 7.6 mg/dL — ABNORMAL LOW (ref 8.9–10.3)
Chloride: 100 mmol/L (ref 98–111)
Chloride: 98 mmol/L (ref 98–111)
Creatinine, Ser: 13.53 mg/dL — ABNORMAL HIGH (ref 0.44–1.00)
Creatinine, Ser: 13.26 mg/dL — ABNORMAL HIGH (ref 0.44–1.00)
GFR, Estimated: 3 mL/min — ABNORMAL LOW (ref >60)
GFR, Estimated: 3 mL/min — ABNORMAL LOW (ref >60)
Glucose, Bld: 229 mg/dL — ABNORMAL HIGH (ref 70–99)
Glucose, Bld: 108 mg/dL — ABNORMAL HIGH (ref 70–99)
Potassium: 4.4 mmol/L (ref 3.5–5.1)
Potassium: 5 mmol/L (ref 3.5–5.1)
Sodium: 134 mmol/L — ABNORMAL LOW (ref 135–145)
Sodium: 133 mmol/L — ABNORMAL LOW (ref 135–145)

## 2020-03-21 LAB — GLUCOSE, CAPILLARY
Glucose-Capillary: 115 mg/dL — ABNORMAL HIGH (ref 70–99)
Glucose-Capillary: 123 mg/dL — ABNORMAL HIGH (ref 70–99)
Glucose-Capillary: 138 mg/dL — ABNORMAL HIGH (ref 70–99)
Glucose-Capillary: 141 mg/dL — ABNORMAL HIGH (ref 70–99)
Glucose-Capillary: 149 mg/dL — ABNORMAL HIGH (ref 70–99)
Glucose-Capillary: 160 mg/dL — ABNORMAL HIGH (ref 70–99)
Glucose-Capillary: 161 mg/dL — ABNORMAL HIGH (ref 70–99)
Glucose-Capillary: 168 mg/dL — ABNORMAL HIGH (ref 70–99)
Glucose-Capillary: 179 mg/dL — ABNORMAL HIGH (ref 70–99)
Glucose-Capillary: 196 mg/dL — ABNORMAL HIGH (ref 70–99)
Glucose-Capillary: 215 mg/dL — ABNORMAL HIGH (ref 70–99)
Glucose-Capillary: 217 mg/dL — ABNORMAL HIGH (ref 70–99)
Glucose-Capillary: 294 mg/dL — ABNORMAL HIGH (ref 70–99)
Glucose-Capillary: 84 mg/dL (ref 70–99)
Glucose-Capillary: 95 mg/dL (ref 70–99)
Glucose-Capillary: 96 mg/dL (ref 70–99)
Glucose-Capillary: 98 mg/dL (ref 70–99)

## 2020-03-21 LAB — CBC
HCT: 45.6 % (ref 36.0–46.0)
Hemoglobin: 14.5 g/dL (ref 12.0–15.0)
MCH: 27.2 pg (ref 26.0–34.0)
MCHC: 31.8 g/dL (ref 30.0–36.0)
MCV: 85.4 fL (ref 80.0–100.0)
Platelets: 341 10*3/uL (ref 150–400)
RBC: 5.34 MIL/uL — ABNORMAL HIGH (ref 3.87–5.11)
RDW: 14.8 % (ref 11.5–15.5)
WBC: 8.3 10*3/uL (ref 4.0–10.5)
nRBC: 0 % (ref 0.0–0.2)

## 2020-03-21 LAB — HEMOGLOBIN A1C
Hgb A1c MFr Bld: 6.8 % — ABNORMAL HIGH (ref 4.8–5.6)
Mean Plasma Glucose: 148.46 mg/dL

## 2020-03-21 LAB — PROTIME-INR
INR: 1.1 (ref 0.8–1.2)
Prothrombin Time: 13.3 seconds (ref 11.4–15.2)

## 2020-03-21 LAB — BETA-HYDROXYBUTYRIC ACID: Beta-Hydroxybutyric Acid: 0.16 mmol/L (ref 0.05–0.27)

## 2020-03-21 LAB — APTT: aPTT: 28 seconds (ref 24–36)

## 2020-03-21 LAB — MAGNESIUM: Magnesium: 1.7 mg/dL (ref 1.7–2.4)

## 2020-03-21 MED ORDER — INSULIN GLARGINE 100 UNIT/ML ~~LOC~~ SOLN
25.0000 [IU] | Freq: Every day | SUBCUTANEOUS | Status: DC
  Administered 2020-03-21 – 2020-03-22 (×2): 25 [IU] via SUBCUTANEOUS
  Filled 2020-03-21 (×2): qty 0.25

## 2020-03-21 MED ORDER — HYDRALAZINE HCL 20 MG/ML IJ SOLN
10.0000 mg | INTRAMUSCULAR | Status: DC | PRN
  Administered 2020-03-21 – 2020-03-22 (×4): 10 mg via INTRAVENOUS
  Filled 2020-03-21 (×3): qty 1

## 2020-03-21 MED ORDER — METOPROLOL TARTRATE 5 MG/5ML IV SOLN
5.0000 mg | Freq: Four times a day (QID) | INTRAVENOUS | Status: DC
  Administered 2020-03-21 – 2020-03-22 (×5): 5 mg via INTRAVENOUS
  Filled 2020-03-21 (×5): qty 5

## 2020-03-21 MED ORDER — DIPHENHYDRAMINE HCL 50 MG/ML IJ SOLN
50.0000 mg | Freq: Four times a day (QID) | INTRAMUSCULAR | Status: DC | PRN
  Filled 2020-03-21: qty 1

## 2020-03-21 MED ORDER — LORATADINE 10 MG PO TABS: 10.0000 mg | ORAL_TABLET | Freq: Every day | ORAL | Status: DC

## 2020-03-21 MED ORDER — PROCHLORPERAZINE EDISYLATE 10 MG/2ML IJ SOLN
10.0000 mg | INTRAMUSCULAR | Status: DC | PRN
  Administered 2020-03-21 – 2020-03-22 (×3): 10 mg via INTRAVENOUS
  Filled 2020-03-21 (×3): qty 2

## 2020-03-21 MED ORDER — ZOLPIDEM TARTRATE 5 MG PO TABS
5.0000 mg | ORAL_TABLET | Freq: Every evening | ORAL | Status: DC | PRN
  Filled 2020-03-21: qty 1

## 2020-03-21 MED ORDER — INSULIN ASPART 100 UNIT/ML ~~LOC~~ SOLN
6.0000 [IU] | Freq: Once | SUBCUTANEOUS | Status: AC
  Administered 2020-03-21: 6 [IU] via SUBCUTANEOUS

## 2020-03-21 MED ORDER — METOPROLOL SUCCINATE ER 100 MG PO TB24
100.0000 mg | ORAL_TABLET | Freq: Two times a day (BID) | ORAL | Status: DC
  Filled 2020-03-21: qty 1

## 2020-03-21 MED ORDER — INSULIN ASPART 100 UNIT/ML ~~LOC~~ SOLN
0.0000 [IU] | Freq: Three times a day (TID) | SUBCUTANEOUS | Status: DC
  Administered 2020-03-22: 4 [IU] via SUBCUTANEOUS

## 2020-03-21 MED ORDER — AMLODIPINE BESYLATE 10 MG PO TABS: 10.0000 mg | ORAL_TABLET | Freq: Every day | ORAL | Status: DC

## 2020-03-21 NOTE — Progress Notes (Signed)
[facility] Progress Note   Subjective:   Pt seen in room. BP remains quite elevated, denies SOB, CP, palpitations, dizziness. Reports she had mild abdominal pain at the end of HD and nausea this morning. PD fluid at bedside is clear.   Objective Vitals:   03/20/20 2134 03/20/20 2232 03/21/20 0659 03/21/20 0810  BP: (!) 156/105 (!) 144/124 (!) 170/111 (!) 161/112  Pulse: (!) 105 (!) 105 97 (!) 101  Resp: 18 18 17 16   Temp: 98.5 F (36.9 C) 98.1 F (36.7 C) 98.2 F (36.8 C) 97.8 F (36.6 C)  TempSrc: Oral Oral Oral Oral  SpO2:  98% 98%   Weight:   79.8 kg   Height:       Physical Exam General: Well developed, alert female in NAD Heart: RRR, no murmurs, rubs or gallops Lungs: CTA bilaterally without wheezing, rhonchi or rales Abdomen: Soft, non-distended, mild diffuse TTP.  Extremities: No edema bilateral lower extremities Dialysis Access: PD cath R abdomen with clean, dry dressing, no surrounding erythema  Additional Objective Labs: Basic Metabolic Panel: Recent Labs  Lab 03/20/20 0758 03/20/20 1147 03/20/20 1749 03/20/20 1749 03/20/20 2058 03/21/20 0032 03/21/20 0355  NA 133*   < > 132*  --   --  134* 134*  K 4.7  4.7   < > 6.5*   < > 5.2* 4.4 4.0  CL 98   < > 97*  --   --  100 99  CO2 18*   < > 19*  --   --  21* 21*  GLUCOSE 217*   < > 246*  --   --  229* 167*  BUN 75*   < > 76*  --   --  68* 64*  CREATININE 15.68*   < > 15.30*  --   --  13.53* 13.21*  CALCIUM 7.7*   < > 7.6*  --   --  7.8* 7.8*  PHOS 6.0*  --   --   --   --   --   --    < > = values in this interval not displayed.   Liver Function Tests: Recent Labs  Lab 03/20/20 0113 03/21/20 0355  AST 9* 10*  ALT 9 7  ALKPHOS 123 83  BILITOT 0.7 0.3  PROT 6.2* 5.1*  ALBUMIN 3.1* 2.4*   Recent Labs  Lab 03/20/20 0113  LIPASE 38   CBC: Recent Labs  Lab 03/20/20 0113 03/20/20 0435 03/21/20 0355  WBC 7.5  --  8.3  HGB 14.7 14.3 14.5  HCT 46.9* 42.0 45.6  MCV 89.0  --   85.4  PLT 366  --  341   Blood Culture    Component Value Date/Time   SDES BLOOD RIGHT ARM 03/20/2020 1558   SPECREQUEST  03/20/2020 1558    BOTTLES DRAWN AEROBIC AND ANAEROBIC Blood Culture adequate volume   CULT  03/20/2020 1558    NO GROWTH < 12 HOURS Performed at [facility], [place] [address], [place], [place] [address]    REPTSTATUS PENDING 03/20/2020 1558    CBG: Recent Labs  Lab 03/21/20 0214 03/21/20 0312 03/21/20 0421 03/21/20 0640 03/21/20 0803  GLUCAP 149* 160* 161* 196* 96    Studies/Results: CT ABDOMEN PELVIS WO CONTRAST  Result Date: 03/20/2020 CLINICAL DATA:  Abdominal pain with diarrhea EXAM: CT ABDOMEN AND PELVIS WITHOUT CONTRAST TECHNIQUE: Multidetector CT imaging of the abdomen and pelvis was performed following the standard protocol without IV contrast. COMPARISON:  None.  FINDINGS: LOWER CHEST: Normal. HEPATOBILIARY: Small amount of anterior perihepatic free fluid. The gallbladder is normal. PANCREAS: Normal pancreas. No ductal dilatation or peripancreatic fluid collection. SPLEEN: Normal. ADRENALS/URINARY TRACT: The adrenal glands are normal. No hydronephrosis, nephroureterolithiasis or solid renal mass. The urinary bladder is normal for degree of distention STOMACH/BOWEL: There is no hiatal hernia. Normal duodenal course and caliber. No small bowel dilatation or inflammation. No focal colonic abnormality. Status post appendectomy. There is mesenteric edema in the upper abdomen. VASCULAR/LYMPHATIC: Normal course and caliber of the major abdominal vessels. No abdominal or pelvic lymphadenopathy. REPRODUCTIVE: 4.1 cm left adnexal cyst.  Normal uterus. MUSCULOSKELETAL. No bony spinal canal stenosis or focal osseous abnormality. OTHER: Small amount of free fluid in the pelvis. IMPRESSION: 1. Mesenteric edema in the upper abdomen may be due to volume overload in patient on peritoneal dialysis. Enteritis is also a possibility. 2. Small volume perihepatic and pelvic  free fluid is not unexpected in the setting of peritoneal dialysis. 3. 4.1 cm left adnexal cyst. No follow-up imaging recommended. Note: This recommendation does not apply to premenarchal patients and to those with increased risk (genetic, family history, elevated tumor markers or other high-risk factors) of ovarian cancer. Reference: JACR 2020 Feb; 17(2):248-254 Electronically Signed   By: Ulyses Jarred M.D.   On: 03/20/2020 02:42   Medications: . dextrose 5 % and 0.45% NaCl 125 mL/hr at 03/21/20 0428  . dialysis solution 2.5% low-MG/low-CA    . insulin Stopped (03/21/20 0810)  . lactated ringers     . amLODipine  10 mg Oral Daily  . gentamicin cream  1 application Topical Daily  . heparin  5,000 Units Subcutaneous Q8H  . metoprolol succinate  100 mg Oral BID    Dialysis Orders: [facility] on PD 6 exchanges 2.2L fill vol 1 hr 30 min dwell time EDW 78.5  Assessment/Plan: 1 n/v/d: ? If DKA or infectious symptom.  C diff and GI pathogen panel ordered and pending. No WBC ct CT abd overall benign.  Per primary 2 ESRD:  On PD, will provide tonight, using all 2.5%.  Cell countt and culture to r/o peritonitis are pending however PD fluid looks clear this morning.  3 Hypertension: reports she is taking amlodipine 10mg  QHS and metoprolol xr 100mg  BID. Also uses clonidine PRN for systolic BP >[id number]. BP remains very elevated despite UF. On clear liquid diet. Will restart metoprolol and amlodipine.  4. Anemia of ESRD:  Hgb 14.5. Monitor, ESA as needed 5. Metabolic Bone Disease: Corrected calcium at goal. Resume binders when eating 6.  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 DKA: insulin per primary  8.  Dispo: admitted.   Anice Paganini, PA-C 03/21/2020, 8:22 AM  [facility] Pager: [phone]

## 2020-03-21 NOTE — Progress Notes (Signed)
PROGRESS NOTE  Kelly Huff DXI:[id number] DOB: December 29, 1985 DOA: 03/20/2020 PCP: Rubie Maid, MD  Brief History   Kelly Huff is a 34 y.o. female with medical history significant for type 1 diabetes, ESRD on peritoneal dialysis, HUS, TTP who presents to the emergency department due to 4-day onset of nausea, vomiting and diarrhea.  This was initially associated with abdominal pain but this has resolved.  Vomiting was several episodes daily, it was nonbilious and nonbloody with last episode PTA.  Diarrhea was also of watery nature and several times in a day with last episode while in ED waiting room.  Last dialysis was on Monday due to need to have to interrupt the dialysis to use the restroom due to the recurrent diarrhea.  She denies fever, cough, sick contact and denies treatment prior to coming to the ED.  ED Course:  In the emergency department, she was hemodynamically stable.  Work-up in the ED showed normocytic anemia, hyponatremia, hyperkalemia, BUN/creatinine 72/16.18, hyperglycemia, albumin 3.1.  Respiratory panel for influenza A, B and SARS coronavirus 2 was negative.  CT abdomen and pelvis showed mesenteric edema in the upper abdomen which may be due to volume overload in patient on peritoneal dialysis.  Enteritis was also considered to be a possibility.  She was treated with IV Dilaudid due to abdominal pain, IV Zofran due to vomiting, Lokelma due to hyperkalemia was given.  Patient was initially started on IV hydration and Endo tool, but dehydration was stopped after the ED PA consulted with nephrology regarding care and it was recommended for patient to be admitted to hospitalist service and that there is currently no indication for dialysis.  [facility] were consulted to admit the patient for further evaluation and treatment. She was admitted to a progressive bed and placed on an endotool. Nephrology was consulted and that patient was continued on her PD regimen. She was  given dilaudid for her abdominal pain which is thought to be due to her acidosis. The patient's beta butyric acid has returned to normal today, although she continued to have a metabolic acidosis which is thought to be due to her renal failure. Endotool will be stopped and she will be placed on lantus with FSBS and SSI.  Peritoneal fluid has been cultured and blood cultures x 2 have been obtained. There has been no growth. Per nephrology PD fluid appeared clear.  Consultants  . Nephrology  Procedures  . Peritoneal Dialysis  Antibiotics   Anti-infectives (From admission, onward)   None    .   Subjective  Patient states that she is feeling better, although she is still nauseated.  Objective   Vitals:  Vitals:   03/21/20 1212 03/21/20 1419  BP: (!) 162/106   Pulse:    Resp:  16  Temp:    SpO2:  100%   Exam:  Constitutional:  . The patient is awake, alert, and oriented x 3. No acute distress. Respiratory:  . No increased work of breathing. . No wheezes, rales, or rhonchi . No tactile fremitus Cardiovascular:  . Regular rate and rhythm . No murmurs, ectopy, or gallups. . No lateral PMI. No thrills. Abdomen:  . Abdomen is soft, non-tender, non-distended . No hernias, masses, or organomegaly . Normoactive bowel sounds.  Musculoskeletal:  . No cyanosis, clubbing, or edema Skin:  . No rashes, lesions, ulcers . palpation of skin: no induration or nodules Neurologic:  . CN 2-12 intact . Sensation all 4 extremities intact Psychiatric:  . Mental status  o Mood, affect appropriate o Orientation to person, place, time  . judgment and insight appear intact  I have personally reviewed the following:   Today's Data  . Vitals, CBC, BMP, Beta hydroxybutyric acid  Micro Data  . Peritoneal fluid culture: No growth . Blood cultures: No growth  Imaging  . CT abdomen  Scheduled Meds: . amLODipine  10 mg Oral Daily  . gentamicin cream  1 application Topical Daily  .  heparin  5,000 Units Subcutaneous Q8H  . insulin glargine  25 Units Subcutaneous Daily  . metoprolol tartrate  5 mg Intravenous Q6H   Continuous Infusions: . 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 DKA resolved. Endotool discontinued. Lantus and FSBS and SSI started. Advance diet to full liquid diet.   Nausea, vomiting, diarrhea and abdominal pain: Abdominal pain is improved, and no emesis today, but the patient is still nauseated. Continue IV Zofran 4 mg every 6 hours as needed. Will reduce dose of dilaudid. No further diarrhea since admission. CTA abdomen demonstrated mesenteric edema probably related to her PD or volume overload.  Pseudohyponatremia: Resolved. Monitor.  Hyperkalemia: Resolved with PD.  ESRD on PD: Last peritoneal dialysis was on 03/20/2020. PhosLo and calcitriol will be continued when patient resumes oral intake.  Essential hypertension: Continue home meds when patient resumes oral intake.  Hypoalbuminemia secondary to mild protein calorie malnutrition Albumin 3.1, oral supplement will be provided when patient resumes oral intake (currently n.p.o.)  DVT prophylaxis: Heparin subcu Code Status: Full code Family Communication: None at bedside Disposition Plan:  Patient is from:home Anticipated DC ON:GEXB Anticipated DC date: 1 day Anticipated DC barriers: Patient is unstable to be discharged at this time due to nausea, vomiting, diarrhea and abdominal pain and DKA requiring  inpatient management.    July Linam, DO [facility] Direct contact: see [email or web address]  7PM-7AM contact night coverage as above 03/21/2020, 4:14 PM  LOS: 0 days

## 2020-03-22 DIAGNOSIS — R1084 Generalized abdominal pain: Secondary | ICD-10-CM | POA: Diagnosis not present

## 2020-03-22 DIAGNOSIS — R197 Diarrhea, unspecified: Secondary | ICD-10-CM | POA: Diagnosis not present

## 2020-03-22 DIAGNOSIS — N186 End stage renal disease: Secondary | ICD-10-CM | POA: Diagnosis not present

## 2020-03-22 DIAGNOSIS — E1010 Type 1 diabetes mellitus with ketoacidosis without coma: Secondary | ICD-10-CM | POA: Diagnosis not present

## 2020-03-22 LAB — GLUCOSE, CAPILLARY
Glucose-Capillary: 337 mg/dL — ABNORMAL HIGH (ref 70–99)
Glucose-Capillary: 323 mg/dL — ABNORMAL HIGH (ref 70–99)
Glucose-Capillary: 65 mg/dL — ABNORMAL LOW (ref 70–99)
Glucose-Capillary: 65 mg/dL — ABNORMAL LOW (ref 70–99)
Glucose-Capillary: 197 mg/dL — ABNORMAL HIGH (ref 70–99)

## 2020-03-22 LAB — BASIC METABOLIC PANEL
Anion gap: 21 — ABNORMAL HIGH (ref 5–15)
BUN: 63 mg/dL — ABNORMAL HIGH (ref 6–20)
CO2: 18 mmol/L — ABNORMAL LOW (ref 22–32)
Calcium: 7.7 mg/dL — ABNORMAL LOW (ref 8.9–10.3)
Chloride: 94 mmol/L — ABNORMAL LOW (ref 98–111)
Creatinine, Ser: 13.68 mg/dL — ABNORMAL HIGH (ref 0.44–1.00)
GFR, Estimated: 3 mL/min — ABNORMAL LOW (ref >60)
Glucose, Bld: 340 mg/dL — ABNORMAL HIGH (ref 70–99)
Potassium: 4.5 mmol/L (ref 3.5–5.1)
Sodium: 133 mmol/L — ABNORMAL LOW (ref 135–145)

## 2020-03-22 LAB — BODY FLUID CELL COUNT WITH DIFFERENTIAL
Eos, Fluid: 0 %
Lymphs, Fluid: 7 %
Monocyte-Macrophage-Serous Fluid: 85 % (ref 50–90)
Neutrophil Count, Fluid: 8 % (ref 0–25)
Total Nucleated Cell Count, Fluid: 34 cu mm (ref 0–1000)

## 2020-03-22 LAB — GRAM STAIN

## 2020-03-22 MED ORDER — INSULIN ASPART 100 UNIT/ML ~~LOC~~ SOLN
6.0000 [IU] | Freq: Once | SUBCUTANEOUS | Status: AC
  Administered 2020-03-22: 6 [IU] via SUBCUTANEOUS

## 2020-03-22 MED ORDER — INSULIN LISPRO 100 UNIT/ML ~~LOC~~ SOLN: SUBCUTANEOUS | 11 refills | Status: AC

## 2020-03-22 MED ORDER — SODIUM BICARBONATE 650 MG PO TABS
650.0000 mg | ORAL_TABLET | Freq: Three times a day (TID) | ORAL | 0 refills | Status: DC
Start: 2020-03-22 — End: 2023-01-06

## 2020-03-22 MED ORDER — ALPRAZOLAM 0.25 MG PO TABS
0.2500 mg | ORAL_TABLET | Freq: Two times a day (BID) | ORAL | Status: DC | PRN
  Administered 2020-03-22: 0.25 mg via ORAL
  Filled 2020-03-22: qty 1

## 2020-03-22 MED ORDER — INSULIN GLARGINE 100 UNIT/ML ~~LOC~~ SOLN
10.0000 [IU] | Freq: Once | SUBCUTANEOUS | Status: AC
  Administered 2020-03-22: 10 [IU] via SUBCUTANEOUS
  Filled 2020-03-22: qty 0.1

## 2020-03-22 MED ORDER — INSULIN GLARGINE 100 UNIT/ML ~~LOC~~ SOLN: 35.0000 [IU] | Freq: Every day | SUBCUTANEOUS | Status: DC

## 2020-03-22 MED ORDER — HYDRALAZINE HCL 20 MG/ML IJ SOLN
10.0000 mg | Freq: Once | INTRAMUSCULAR | Status: AC
  Administered 2020-03-22: 10 mg via INTRAVENOUS
  Filled 2020-03-22: qty 1

## 2020-03-22 MED ORDER — SODIUM BICARBONATE 650 MG PO TABS
650.0000 mg | ORAL_TABLET | Freq: Three times a day (TID) | ORAL | Status: DC
  Administered 2020-03-22: 650 mg via ORAL
  Filled 2020-03-22: qty 1

## 2020-03-22 NOTE — Discharge Summary (Signed)
Physician Discharge Summary  Kelly Huff:[id number] DOB: 04/20/1986 DOA: 03/20/2020  PCP: Kelly Maid, MD  Admit date: 03/20/2020 Discharge date: 03/22/2020  Recommendations for Outpatient Follow-up:  1. Discharge to home 2. Restart insulin pump on the AM of 03/23/2020 3. Check glucoses tid before meals and record glucoses. 4. Follow up with PCP in 7-10 days. Have chemistry checked at that visit. 5. Continue peritoneal dialysis as previous as outpatient.  Discharge Diagnoses: Principal diagnosis is #1 1. Anion Gap metabolic acidosis secondary to DKA and ESRD. 2. Nausea vomiting, diarrhea, and abdominal pain.  3. Pseudohyponatremia 4. Hyperkalemia 5. ESRD on PD 6. Essential Hypertension 7. Hypoalbuminemia  Discharge Condition: Fair  Disposition: Home  Diet recommendation: Heart healthy/Modified carbohydrate  Filed Weights   03/21/20 0659 03/21/20 0850 03/22/20 1029  Weight: 79.8 kg 81.8 kg 78.1 kg    History of present illness:  Kelly Huff is a 34 y.o. female with medical history significant for type 1 diabetes, ESRD on peritoneal dialysis, HUS, TTP who presents to the emergency department due to 4-day onset of nausea, vomiting and diarrhea.  This was initially associated with abdominal pain but this has resolved.  Vomiting was several episodes daily, it was nonbilious and nonbloody with last episode PTA.  Diarrhea was also of watery nature and several times in a day with last episode while in ED waiting room.  Last dialysis was on Monday due to need to have to interrupt the dialysis to use the restroom due to the recurrent diarrhea.  She denies fever, cough, sick contact and denies treatment prior to coming to the ED.  ED Course:  In the emergency department, she was hemodynamically stable.  Work-up in the ED showed normocytic anemia, hyponatremia, hyperkalemia, BUN/creatinine 72/16.18, hyperglycemia, albumin 3.1.  Respiratory panel for influenza A, B and SARS  coronavirus 2 was negative.  CT abdomen and pelvis showed mesenteric edema in the upper abdomen which may be due to volume overload in patient on peritoneal dialysis.  Enteritis was also considered to be a possibility.  She was treated with IV Dilaudid due to abdominal pain, IV Zofran due to vomiting, Lokelma due to hyperkalemia was given.  Patient was initially started on IV hydration and Endo tool, but dehydration was stopped after the ED PA consulted with nephrology regarding care and it was recommended for patient to be admitted to hospitalist service and that there is currently no indication for dialysis.  Hospital Course: [facility] were consulted to admit the patient for further evaluation and treatment. She was admitted to a progressive bed and placed on an endotool. Nephrology was consulted and that patient was continued on her PD regimen. She was given dilaudid for her abdominal pain which is thought to be due to her acidosis. The patient's beta butyric acid has returned to normal today, although she continued to have a metabolic acidosis which is thought to be due to her renal failure. Endotool was stopped when beta hydroxybutyric acid was normal. She was placed on lantus with FSBS and SSI for the remainder of her inpatient stay.  Peritoneal fluid has been cultured and blood cultures x 2 have been obtained. There has been no growth. Per nephrology PD fluid appeared clear.  This morning the patient was feeling better, tolerating her diet and her glucoses were under improved control.  She will be discharged to home in fair condition. She was told to restart her insulin pump on the morning of 03/23/2020 as she did receive Lantus this  am.  Today's assessment: S: The patient is resting comfortably. No new complaints. O: Vitals:  Vitals:   03/22/20 1029 03/22/20 1040  BP: (!) 160/99 (!) 160/99  Pulse:    Resp:    Temp:    SpO2:     Exam:  Constitutional:  . The patient is  awake, alert, and oriented x 3. No acute distress. Respiratory:  . No increased work of breathing. . No wheezes, rales, or rhonchi . No tactile fremitus Cardiovascular:  . Regular rate and rhythm . No murmurs, ectopy, or gallups. . No lateral PMI. No thrills. Abdomen:  . Abdomen is soft, non-tender, non-distended . No hernias, masses, or organomegaly . Normoactive bowel sounds.  Musculoskeletal:  . No cyanosis, clubbing, or edema Skin:  . No rashes, lesions, ulcers . palpation of skin: no induration or nodules Neurologic:  . CN 2-12 intact . Sensation all 4 extremities intact Psychiatric:  . Mental status o Mood, affect appropriate o Orientation to person, place, time  . judgment and insight appear intact  Discharge Instructions  Discharge Instructions    Activity as tolerated - No restrictions   Complete by: As directed    Call MD for:   Complete by: As directed    Blood sugars less than 65.   Call MD for:  persistant dizziness or light-headedness   Complete by: As directed    Call MD for:  persistant nausea and vomiting   Complete by: As directed    Call MD for:  severe uncontrolled pain   Complete by: As directed    Call MD for:  temperature >100.4   Complete by: As directed    Diet - low sodium heart healthy   Complete by: As directed    Discharge instructions   Complete by: As directed    Discharge to home. Restart insulin pump on 03/23/2020. Check glucoses three times daily and record values.  Eat a bedtime snack with protein before going to bed at night. Follow up with PCP in 7-10 days. Have chemistry checked at that visit and bring blood glucoses with you.   Discharge wound care:   Complete by: As directed    Exit site care:  Once      Comments: Clean skin near exit site with chloraprep swab sticks.  Starting at catheter, use circular pattern around exit site, moving towards outer edges of area covered by dressing.  Apply gentamicin cream to site once  daily.  Cover with dry dressing.   Increase activity slowly   Complete by: As directed      Allergies as of 03/22/2020      Reactions   Morphine Itching, Other (See Comments)   Hallucination   Sulfamethoxazole-trimethoprim Hives   Trulicity [dulaglutide] Other (See Comments)   She is DM Type 1. Was prescribed but damaged her kidneys      Medication List    STOP taking these medications   oxyCODONE 5 MG immediate release tablet Commonly known as: Oxy IR/ROXICODONE     TAKE these medications   acetaminophen 325 MG tablet Commonly known as: TYLENOL Take 2 tablets (650 mg total) by mouth every 4 (four) hours as needed for mild pain or fever.   amLODipine 10 MG tablet Commonly known as: NORVASC Take 10 mg by mouth every evening.   calcitRIOL 0.25 MCG capsule Commonly known as: ROCALTROL Take 0.25 mcg by mouth daily.   calcium acetate 667 MG tablet Commonly known as: PHOSLO Take 2,001 mg by mouth 3 (  three) times daily with meals.   cinacalcet 30 MG tablet Commonly known as: SENSIPAR Take 30 mg by mouth daily.   cloNIDine 0.1 mg/24hr patch Commonly known as: CATAPRES - Dosed in mg/24 hr Place 0.1 mg onto the skin See admin instructions. Apply patch on every Sunday   cyclobenzaprine 10 MG tablet Commonly known as: FLEXERIL Take 10 mg by mouth at bedtime as needed for muscle spasms.   gentamicin cream 0.1 % Commonly known as: GARAMYCIN Apply 1 application topically See admin instructions. Apply small amount to catheter exit site once daily   insulin lispro 100 UNIT/ML injection Commonly known as: HUMALOG Continue use of insulin pump with setting as previous: Restart in the morning of 03/23/2020 as Lantus was given on the morning of 03/22/2020. What changed:   how to take this  when to take this  additional instructions   losartan 50 MG tablet Commonly known as: COZAAR Take 50 mg by mouth daily.   metoprolol succinate 100 MG 24 hr tablet Commonly known as:  TOPROL-XL Take 100 mg by mouth 2 (two) times daily.   sodium bicarbonate 650 MG tablet Take 1 tablet (650 mg total) by mouth 3 (three) times daily.            Discharge Care Instructions  (From admission, onward)         Start     Ordered   03/22/20 0000  Discharge wound care:       Comments: Exit site care:  Once      Comments: Clean skin near exit site with chloraprep swab sticks.  Starting at catheter, use circular pattern around exit site, moving towards outer edges of area covered by dressing.  Apply gentamicin cream to site once daily.  Cover with dry dressing.   03/22/20 1429         Allergies  Allergen Reactions  . Morphine Itching and Other (See Comments)    Hallucination  . Sulfamethoxazole-Trimethoprim Hives  . Trulicity [Dulaglutide] Other (See Comments)    She is DM Type 1. Was prescribed but damaged her kidneys    The results of significant diagnostics from this hospitalization (including imaging, microbiology, ancillary and laboratory) are listed below for reference.    Significant Diagnostic Studies: CT ABDOMEN PELVIS WO CONTRAST  Result Date: 03/20/2020 CLINICAL DATA:  Abdominal pain with diarrhea EXAM: CT ABDOMEN AND PELVIS WITHOUT CONTRAST TECHNIQUE: Multidetector CT imaging of the abdomen and pelvis was performed following the standard protocol without IV contrast. COMPARISON:  None. FINDINGS: LOWER CHEST: Normal. HEPATOBILIARY: Small amount of anterior perihepatic free fluid. The gallbladder is normal. PANCREAS: Normal pancreas. No ductal dilatation or peripancreatic fluid collection. SPLEEN: Normal. ADRENALS/URINARY TRACT: The adrenal glands are normal. No hydronephrosis, nephroureterolithiasis or solid renal mass. The urinary bladder is normal for degree of distention STOMACH/BOWEL: There is no hiatal hernia. Normal duodenal course and caliber. No small bowel dilatation or inflammation. No focal colonic abnormality. Status post appendectomy. There is  mesenteric edema in the upper abdomen. VASCULAR/LYMPHATIC: Normal course and caliber of the major abdominal vessels. No abdominal or pelvic lymphadenopathy. REPRODUCTIVE: 4.1 cm left adnexal cyst.  Normal uterus. MUSCULOSKELETAL. No bony spinal canal stenosis or focal osseous abnormality. OTHER: Small amount of free fluid in the pelvis. IMPRESSION: 1. Mesenteric edema in the upper abdomen may be due to volume overload in patient on peritoneal dialysis. Enteritis is also a possibility. 2. Small volume perihepatic and pelvic free fluid is not unexpected in the setting of peritoneal dialysis. 3.  4.1 cm left adnexal cyst. No follow-up imaging recommended. Note: This recommendation does not apply to premenarchal patients and to those with increased risk (genetic, family history, elevated tumor markers or other high-risk factors) of ovarian cancer. Reference: JACR 2020 Feb; 17(2):248-254 Electronically Signed   By: Ulyses Jarred M.D.   On: 03/20/2020 02:42    Microbiology: Recent Results (from the past 240 hour(s))  Respiratory Panel by RT PCR (Flu A&B, Covid) - Nasopharyngeal Swab     Status: None   Collection Time: 03/20/20  4:26 AM   Specimen: Nasopharyngeal Swab; Nasopharyngeal(NP) swabs in vial transport medium  Result Value Ref Range Status   SARS Coronavirus 2 by RT PCR NEGATIVE NEGATIVE Final    Comment: (NOTE) SARS-CoV-2 target nucleic acids are NOT DETECTED.  The SARS-CoV-2 RNA is generally detectable in upper respiratoy specimens during the acute phase of infection. The lowest concentration of SARS-CoV-2 viral copies this assay can detect is 131 copies/mL. A negative result does not preclude SARS-Cov-2 infection and should not be used as the sole basis for treatment or other patient management decisions. A negative result may occur with  improper specimen collection/handling, submission of specimen other than nasopharyngeal swab, presence of viral mutation(s) within the areas targeted by  this assay, and inadequate number of viral copies (<131 copies/mL). A negative result must be combined with clinical observations, patient history, and epidemiological information. The expected result is Negative.  Fact Sheet for Patients:  PinkCheek.be  Fact Sheet for Healthcare Providers:  GravelBags.it  This test is no t yet approved or cleared by the [place] FDA and  has been authorized for detection and/or diagnosis of SARS-CoV-2 by FDA under an Emergency Use Authorization (EUA). This EUA will remain  in effect (meaning this test can be used) for the duration of the COVID-19 declaration under Section 564(b)(1) of the Act, 21 U.S.C. section 360bbb-3(b)(1), unless the authorization is terminated or revoked sooner.     Influenza A by PCR NEGATIVE NEGATIVE Final   Influenza B by PCR NEGATIVE NEGATIVE Final    Comment: (NOTE) The Xpert Xpress SARS-CoV-2/FLU/RSV assay is intended as an aid in  the diagnosis of influenza from Nasopharyngeal swab specimens and  should not be used as a sole basis for treatment. Nasal washings and  aspirates are unacceptable for Xpert Xpress SARS-CoV-2/FLU/RSV  testing.  Fact Sheet for Patients: PinkCheek.be  Fact Sheet for Healthcare Providers: GravelBags.it  This test is not yet approved or cleared by the [place] FDA and  has been authorized for detection and/or diagnosis of SARS-CoV-2 by  FDA under an Emergency Use Authorization (EUA). This EUA will remain  in effect (meaning this test can be used) for the duration of the  Covid-19 declaration under Section 564(b)(1) of the Act, 21  U.S.C. section 360bbb-3(b)(1), unless the authorization is  terminated or revoked. Performed at [facility], [place] [address], [place], [place] [address]   Culture, blood (routine x 2)     Status: None (Preliminary result)    Collection Time: 03/20/20  3:57 PM   Specimen: BLOOD RIGHT ARM  Result Value Ref Range Status   Specimen Description BLOOD RIGHT ARM  Final   Special Requests   Final    BOTTLES DRAWN AEROBIC AND ANAEROBIC Blood Culture adequate volume   Culture   Final    NO GROWTH 2 DAYS Performed at [facility], [place] [address], [place], [place] [address]    Report Status PENDING  Incomplete  Culture, blood (routine x 2)     Status: None (Preliminary result)   Collection Time: 03/20/20  3:58 PM   Specimen: BLOOD RIGHT ARM  Result Value Ref Range Status   Specimen Description BLOOD RIGHT ARM  Final   Special Requests   Final    BOTTLES DRAWN AEROBIC AND ANAEROBIC Blood Culture adequate volume   Culture   Final    NO GROWTH 2 DAYS Performed at [facility], [address] [place], [place] [address]    Report Status PENDING  Incomplete  Gram stain     Status: None   Collection Time: 03/22/20 10:44 AM   Specimen: Fluid  Result Value Ref Range Status   Specimen Description FLUID  Final   Special Requests PERITONEAL DIALYSIS  Final   Gram Stain   Final    CYTOSPIN SMEAR WBC PRESENT,BOTH PMN AND MONONUCLEAR NO ORGANISMS SEEN Performed at [facility], [place] [address] [place], [place] [address]    Report Status 03/22/2020 FINAL  Final  Culture, body fluid-bottle     Status: None (Preliminary result)   Collection Time: 03/22/20 10:44 AM   Specimen: Fluid  Result Value Ref Range Status   Specimen Description FLUID  Final   Special Requests PERITONEAL DIALYSIS  Final   Culture   Final    NO GROWTH < 12 HOURS Performed at [facility], [place] [address] [place], [place] [address]    Report Status PENDING  Incomplete     Labs: Basic Metabolic Panel: Recent Labs  Lab 03/20/20 0758 03/20/20 1147 03/20/20 1749 03/20/20 1749 03/20/20 2058 03/21/20 0032 03/21/20 0355 03/21/20 0958 03/22/20 0156  NA 133*   < > 132*  --   --  134* 134* 133* 133*  K 4.7  4.7   < >  6.5*   < > 5.2* 4.4 4.0 5.0 4.5  CL 98   < > 97*  --   --  100 99 98 94*  CO2 18*   < > 19*  --   --  21* 21* 19* 18*  GLUCOSE 217*   < > 246*  --   --  229* 167* 108* 340*  BUN 75*   < > 76*  --   --  68* 64* 62* 63*  CREATININE 15.68*   < > 15.30*  --   --  13.53* 13.21* 13.26* 13.68*  CALCIUM 7.7*   < > 7.6*  --   --  7.8* 7.8* 7.6* 7.7*  MG  --   --   --   --   --   --  1.7  --   --   PHOS 6.0*  --   --   --   --   --   --   --   --    < > = values in this interval not displayed.   Liver Function Tests: Recent Labs  Lab 03/20/20 0113 03/21/20 0355  AST 9* 10*  ALT 9 7  ALKPHOS 123 83  BILITOT 0.7 0.3  PROT 6.2* 5.1*  ALBUMIN 3.1* 2.4*   Recent Labs  Lab 03/20/20 0113  LIPASE 38   No results for input(s): AMMONIA in the last 168 hours. CBC: Recent Labs  Lab 03/20/20 0113 03/20/20 0435 03/21/20 0355  WBC 7.5  --  8.3  HGB 14.7 14.3 14.5  HCT 46.9* 42.0 45.6  MCV 89.0  --  85.4  PLT 366  --  341  Cardiac Enzymes: No results for input(s): CKTOTAL, CKMB, CKMBINDEX, TROPONINI in the last 168 hours. BNP: BNP (last 3 results) No results for input(s): BNP in the last 8760 hours.  ProBNP (last 3 results) No results for input(s): PROBNP in the last 8760 hours.  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

## 2020-03-22 NOTE — Plan of Care (Signed)
Problem: Education: Goal: Knowledge of General Education information will improve Description: Including pain rating scale, medication(s)/side effects and non-pharmacologic comfort measures 03/22/2020 1241 by Camillia Herter, RN Outcome: Adequate for Discharge 03/22/2020 0800 by Camillia Herter, RN Outcome: Progressing   Problem: Health Behavior/Discharge Planning: Goal: Ability to manage health-related needs will improve 03/22/2020 1241 by Camillia Herter, RN Outcome: Adequate for Discharge 03/22/2020 0800 by Camillia Herter, RN Outcome: Progressing   Problem: Clinical Measurements: Goal: Ability to maintain clinical measurements within normal limits will improve 03/22/2020 1241 by Camillia Herter, RN Outcome: Adequate for Discharge 03/22/2020 0800 by Camillia Herter, RN Outcome: Progressing Goal: Will remain free from infection 03/22/2020 1241 by Camillia Herter, RN Outcome: Adequate for Discharge 03/22/2020 0800 by Camillia Herter, RN Outcome: Progressing Goal: Diagnostic test results will improve 03/22/2020 1241 by Camillia Herter, RN Outcome: Adequate for Discharge 03/22/2020 0800 by Camillia Herter, RN Outcome: Progressing Goal: Respiratory complications will improve 03/22/2020 1241 by Camillia Herter, RN Outcome: Adequate for Discharge 03/22/2020 0800 by Camillia Herter, RN Outcome: Progressing Goal: Cardiovascular complication will be avoided 03/22/2020 1241 by Camillia Herter, RN Outcome: Adequate for Discharge 03/22/2020 0800 by Camillia Herter, RN Outcome: Progressing   Problem: Activity: Goal: Risk for activity intolerance will decrease 03/22/2020 1241 by Camillia Herter, RN Outcome: Adequate for Discharge 03/22/2020 0800 by Camillia Herter, RN Outcome: Progressing   Problem: Nutrition: Goal: Adequate nutrition will be maintained 03/22/2020 1241 by Camillia Herter, RN Outcome: Adequate for Discharge 03/22/2020 0800 by Camillia Herter, RN Outcome: Progressing    Problem: Coping: Goal: Level of anxiety will decrease 03/22/2020 1241 by Camillia Herter, RN Outcome: Adequate for Discharge 03/22/2020 0800 by Camillia Herter, RN Outcome: Progressing   Problem: Elimination: Goal: Will not experience complications related to bowel motility 03/22/2020 1241 by Camillia Herter, RN Outcome: Adequate for Discharge 03/22/2020 0800 by Camillia Herter, RN Outcome: Progressing Goal: Will not experience complications related to urinary retention 03/22/2020 1241 by Camillia Herter, RN Outcome: Adequate for Discharge 03/22/2020 0800 by Camillia Herter, RN Outcome: Progressing   Problem: Pain Managment: Goal: General experience of comfort will improve 03/22/2020 1241 by Camillia Herter, RN Outcome: Adequate for Discharge 03/22/2020 0800 by Camillia Herter, RN Outcome: Progressing   Problem: Safety: Goal: Ability to remain free from injury will improve 03/22/2020 1241 by Camillia Herter, RN Outcome: Adequate for Discharge 03/22/2020 0800 by Camillia Herter, RN Outcome: Progressing   Problem: Skin Integrity: Goal: Risk for impaired skin integrity will decrease 03/22/2020 1241 by Camillia Herter, RN Outcome: Adequate for Discharge 03/22/2020 0800 by Camillia Herter, RN Outcome: Progressing   Problem: Education: Goal: Ability to describe self-care measures that may prevent or decrease complications (Diabetes Survival Skills Education) will improve 03/22/2020 1241 by Camillia Herter, RN Outcome: Adequate for Discharge 03/22/2020 0800 by Camillia Herter, RN Outcome: Progressing Goal: Individualized Educational Video(s) 03/22/2020 1241 by Camillia Herter, RN Outcome: Adequate for Discharge 03/22/2020 0800 by Camillia Herter, RN Outcome: Progressing   Problem: Cardiac: Goal: Ability to maintain an adequate cardiac output will improve 03/22/2020 1241 by Camillia Herter, RN Outcome: Adequate for Discharge 03/22/2020 0800 by Camillia Herter, RN Outcome:  Progressing   Problem: Health Behavior/Discharge Planning: Goal: Ability to identify and utilize available resources and services will improve 03/22/2020 1241 by Camillia Herter, RN Outcome: Adequate for Discharge 03/22/2020 0800 by Burt Knack,  Bobette Mo, RN Outcome: Progressing Goal: Ability to manage health-related needs will improve 03/22/2020 1241 by Camillia Herter, RN Outcome: Adequate for Discharge 03/22/2020 0800 by Camillia Herter, RN Outcome: Progressing   Problem: Fluid Volume: Goal: Ability to achieve a balanced intake and output will improve 03/22/2020 1241 by Camillia Herter, RN Outcome: Adequate for Discharge 03/22/2020 0800 by Camillia Herter, RN Outcome: Progressing   Problem: Metabolic: Goal: Ability to maintain appropriate glucose levels will improve 03/22/2020 1241 by Camillia Herter, RN Outcome: Adequate for Discharge 03/22/2020 0800 by Camillia Herter, RN Outcome: Progressing   Problem: Nutritional: Goal: Maintenance of adequate nutrition will improve 03/22/2020 1241 by Camillia Herter, RN Outcome: Adequate for Discharge 03/22/2020 0800 by Camillia Herter, RN Outcome: Progressing Goal: Maintenance of adequate weight for body size and type will improve 03/22/2020 1241 by Camillia Herter, RN Outcome: Adequate for Discharge 03/22/2020 0800 by Camillia Herter, RN Outcome: Progressing   Problem: Respiratory: Goal: Will regain and/or maintain adequate ventilation 03/22/2020 1241 by Camillia Herter, RN Outcome: Adequate for Discharge 03/22/2020 0800 by Camillia Herter, RN Outcome: Progressing   Problem: Urinary Elimination: Goal: Ability to achieve and maintain adequate renal perfusion and functioning will improve 03/22/2020 1241 by Camillia Herter, RN Outcome: Adequate for Discharge 03/22/2020 0800 by Camillia Herter, RN Outcome: Progressing

## 2020-03-22 NOTE — Progress Notes (Signed)
[facility] Progress Note   Subjective:  Seen in room. Still with some nausea, no vomiting this morning. Hasn't eaten anything. Feeling anxious. Denies CP or dyspnea. Says PD went fine overnight, 1.8L net UF.  Objective Vitals:   03/22/20 0249 03/22/20 0250 03/22/20 0528 03/22/20 1029  BP: (!) 150/105 (!) 150/105 (!) 152/99 (!) 160/99  Pulse:      Resp:  17    Temp:  98.7 F (37.1 C)    TempSrc:  Oral  Oral  SpO2:      Weight:    78.1 kg  Height:       Physical Exam General: Well appearing, NAD. Room air. Heart: RRR'; no murmur Lungs: CTAB Abdomen: soft, non-tender Extremities: No LE edema Dialysis Access: PD cath in R abdomen  Additional Objective Labs: Basic Metabolic Panel: Recent Labs  Lab 03/20/20 0758 03/20/20 1147 03/21/20 0355 03/21/20 0958 03/22/20 0156  NA 133*   < > 134* 133* 133*  K 4.7  4.7   < > 4.0 5.0 4.5  CL 98   < > 99 98 94*  CO2 18*   < > 21* 19* 18*  GLUCOSE 217*   < > 167* 108* 340*  BUN 75*   < > 64* 62* 63*  CREATININE 15.68*   < > 13.21* 13.26* 13.68*  CALCIUM 7.7*   < > 7.8* 7.6* 7.7*  PHOS 6.0*  --   --   --   --    < > = values in this interval not displayed.   Liver Function Tests: Recent Labs  Lab 03/20/20 0113 03/21/20 0355  AST 9* 10*  ALT 9 7  ALKPHOS 123 83  BILITOT 0.7 0.3  PROT 6.2* 5.1*  ALBUMIN 3.1* 2.4*   Recent Labs  Lab 03/20/20 0113  LIPASE 38   CBC: Recent Labs  Lab 03/20/20 0113 03/20/20 0435 03/21/20 0355  WBC 7.5  --  8.3  HGB 14.7 14.3 14.5  HCT 46.9* 42.0 45.6  MCV 89.0  --  85.4  PLT 366  --  341   Blood Culture    Component Value Date/Time   SDES BLOOD RIGHT ARM 03/20/2020 1558   SPECREQUEST  03/20/2020 1558    BOTTLES DRAWN AEROBIC AND ANAEROBIC Blood Culture adequate volume   CULT  03/20/2020 1558    NO GROWTH < 12 HOURS Performed at [facility], [place] [address], [place], [place] [address]    REPTSTATUS PENDING 03/20/2020 1558   Medications: . dialysis  solution 2.5% low-MG/low-CA     . amLODipine  10 mg Oral Daily  . gentamicin cream  1 application Topical Daily  . heparin  5,000 Units Subcutaneous Q8H  . insulin aspart  0-6 Units Subcutaneous TID WC  . [START ON 03/23/2020] insulin glargine  35 Units Subcutaneous Daily  . metoprolol tartrate  5 mg Intravenous Q6H  . sodium bicarbonate  650 mg Oral TID    Dialysis Orders: [place] home therapies on PD 6 exchanges 2.2L fill vol 1 hr 30 min dwell time EDW 78.5  Assessment/Plan: 1. Nausea/Vomiting/Diarrhea: Unclear if as a result of the DKA or in response to it - improving either way. C.diff/GI panel ordered, not collected as diarrhea resolved. BCx negative. PD fluid Cx was not collected for some reason - done today, pending. Encouraged to try to see how some food does today. 2. ESRD:Continue nightly CCPD, using all 2.5%. Cell count and culture to r/o peritonitis are pending, PD fluid clear.  3. Hypertension:BP a little better with resumption of home meds, follow. 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 DKA: Insulin, per primary.   Veneta Penton, PA-C 03/22/2020, 11:34 AM  [employer]

## 2020-03-22 NOTE — Plan of Care (Signed)

## 2020-03-22 NOTE — Progress Notes (Addendum)
Inpatient Diabetes Program Recommendations  AACE/ADA: New Consensus Statement on Inpatient Glycemic Control (2015)  Target Ranges:  Prepandial:   less than 140 mg/dL      Peak postprandial:   less than 180 mg/dL (1-2 hours)      Critically ill patients:  140 - 180 mg/dL   Results for Kelly Huff, Kelly Huff (MRN [id number]) as of 03/22/2020 07:40  Ref. Range 03/21/2020 08:45 03/21/2020 09:50 03/21/2020 10:46 03/21/2020 11:49 03/21/2020 12:26 03/21/2020 14:13 03/21/2020 15:25 03/21/2020 17:00 03/21/2020 19:20 03/21/2020 22:46  Glucose-Capillary Latest Ref Range: 70 - 99 mg/dL 84  IV Insulin Drip 98  IV Insulin Drip 95  IV Insulin Drip 123 (H)  IV Insulin Drip 115 (H)  IV Insulin Drip 168 (H)  IV Insulin Drip 179 (H)  IV Insulin Drip 141 (H)  IV Insulin Drip  25 units LANTUS @5 :05pm 138 (H)  IV Insulin Drip Stopped 294 (H)  6 units NOVOLOG @11 :43pm   Results for Kelly Huff, Kelly Huff (MRN [id number]) as of 03/22/2020 12:35  Ref. Range 03/22/2020 04:37 03/22/2020 08:01 03/22/2020 11:51 03/22/2020 12:21  Glucose-Capillary Latest Ref Range: 70 - 99 mg/dL 337 (H)  6 units NOVOLOG @5 :17am 323 (H)  4 units NOVOLOG  25 units Lantus @9 :39am and 10 units Lantus @11 :30am 65 (L) 65 (L)    Admit with: DKA/ Hyperkalemia  History: Type 1 Diabetes, ESRD (PD at home)  Home DM Meds: Insulin Pump                             Was taking Lantus 15 units Daily + Novolog 7 units TID with meals back in August prior to starting Insulin Pump  Current Orders: Lantus 35 units Daily      Novolog 0-6 units TID     MD- Note Lantus increased to 35 units Daily this AM  Hypoglycemic at 11am and 12pm--Could be due to too much Lantus on board (pt received 25 units Lantus yesterday at 5pm and then got another 35 units total Lantus this AM)  1. If AM CBG low tomorrow AM, please consider reduction of Lantus  and  2. While patient continues to have nausea, may consider increasing the frequency of the  Novolog SSi to Q4 hours (currently ordered TID)    Spoke with Dr. Cindra Eves office ([facility]) on 11/24.  Spoke with Dr. Buddy Duty directly.  Dr. Buddy Duty told me they didn't have the most recent pump settings on file but did have pump settings from 2019.  Per Dr. Buddy Duty, total basal rate was 13 units (0.55 units/hr), Carb Ratio was 1 unit for every 17 grams carbohydrates, and Correction factor was 1 unit for every 65 mg/dl above target CBG (Target CBG 100 mg/dl)    --Will follow patient during hospitalization--  Wyn Quaker RN, MSN, CDE Diabetes Coordinator Inpatient Glycemic Control Team Team Pager: [phone] (8a-5p)

## 2020-03-25 LAB — CULTURE, BODY FLUID W GRAM STAIN -BOTTLE

## 2020-03-25 LAB — CULTURE, BLOOD (ROUTINE X 2)
Culture: NO GROWTH
Culture: NO GROWTH
Special Requests: ADEQUATE
Special Requests: ADEQUATE

## 2020-03-26 LAB — PATHOLOGIST SMEAR REVIEW

## 2020-03-27 ENCOUNTER — Encounter: Payer: Self-pay | Admitting: Obstetrics and Gynecology

## 2020-03-27 ENCOUNTER — Other Ambulatory Visit: Payer: Self-pay

## 2020-03-27 ENCOUNTER — Ambulatory Visit (INDEPENDENT_AMBULATORY_CARE_PROVIDER_SITE_OTHER): Payer: Medicare Other | Admitting: Obstetrics and Gynecology

## 2020-03-27 VITALS — BP 134/86 | HR 82 | Ht 67.0 in | Wt 178.9 lb

## 2020-03-27 DIAGNOSIS — Z7689 Persons encountering health services in other specified circumstances: Secondary | ICD-10-CM | POA: Diagnosis not present

## 2020-03-27 DIAGNOSIS — R6882 Decreased libido: Secondary | ICD-10-CM

## 2020-03-27 DIAGNOSIS — N912 Amenorrhea, unspecified: Secondary | ICD-10-CM

## 2020-03-27 LAB — POCT URINE PREGNANCY: Preg Test, Ur: NEGATIVE

## 2020-03-27 NOTE — Progress Notes (Signed)
    GYNECOLOGY PROGRESS NOTE  Subjective:    Patient ID: Kelly Huff, female    DOB: 1986/01/10, 34 y.o.   MRN: [id number]  HPI  Patient is a 34 y.o. [id number] female who presents for amenorrhea x 2-3 months.  Has a history of prior tubal ligation.  She does report recently being admitted to the hospital last week for 4 days due to diabetic ketoacidosis.  She did report an episode of bright red bleeding x 1 day while in the hospital but resolved. Unsure if that was a cycle or not. She has multiple comorbidities, including CKD awaiting kidney transplant, DM, HTN, Also had h/o COVID in September.   Additionally, Kelly Huff is still noting decreased libido (more bothersome for her husband than for her).  This has been ongoing for almost 2 years, since the birth of her last child.    The following portions of the patient's history were reviewed and updated as appropriate: allergies, current medications, past family history, past medical history, past social history, past surgical history and problem list.  Review of Systems Pertinent items noted in HPI and remainder of comprehensive ROS otherwise negative.   Objective:   Blood pressure 134/86, pulse 82, height 5\' 7"  (1.702 m), weight 178 lb 14.4 oz (81.1 kg), unknown if currently breastfeeding. General appearance: alert and no distress Pelvic: external genitalia normal, rectovaginal septum normal.  Vagina without discharge.  Cervix normal appearing, no lesions and no motion tenderness.  Uterus mobile, nontender, normal shape and size.  Adnexae non-palpable, nontender bilaterally.     Labs:  Results for orders placed or performed in visit on 03/27/20  POCT urine pregnancy  Result Value Ref Range   Preg Test, Ur Negative Negative    Assessment:   Amenorrhea Decreased libido  Plan:   1. Amenorrhea - has history of BTL, UPT negative, no concerns for ectopic.  Noted that patient had several reasons for amenorrhea, including life  stressors, medical comorbidities, and recent bout of COVID.  Also will rule out other endocrine causes such as thyroid or reproductive hormone insufficiency.  Labs ordered.  Advised on stress reduction, optimizing health. If labs are normal, to return in 3 months if cycles do not return.  2. Decreased libido. Patient notes it is more bothersome for her husband.  Discussed options such as stress reduction, herbal remedies, and discussion with her therapist about her symptoms.   Patient needs to be set up with a PCP. Will place referral.    A total of 15 minutes were spent face-to-face with the patient during this encounter and over half of that time dealt with counseling and coordination of care.   Rubie Maid, MD [facility]

## 2020-03-27 NOTE — Progress Notes (Signed)
Pt present for missed cycles. Pt stated that her last cycle was about 2-3 months ago unsure of date. Pt stated having a lot of upper abd pain. Pt stated that the pain comes and go.

## 2020-03-27 NOTE — Patient Instructions (Signed)
Secondary Amenorrhea  Secondary amenorrhea occurs when a female who was previously having menstrual periods has not had them for 3-6 months. A menstrual period is the monthly shedding of the lining of the uterus. Menstruation involves the passing of blood, tissue, fluid, and mucus through the vagina. The flow of blood usually occurs during 3-7 consecutive days each month. This condition has many causes. In many cases, treating the underlying cause will return menstrual periods back to a normal cycle. What are the causes? The most common cause of this condition is pregnancy. Other causes include:  Malnutrition.  Cirrhosis of the liver.  Conditions of the blood.  Diabetes.  Epilepsy.  Chronic kidney disease.  Polycystic ovary disease.  Stress or anxiety.  A hormonal imbalance.  Ovarian failure.  Medicines.  Extreme obesity.  Cystic fibrosis.  Low body weight or drastic weight loss.  Early menopause.  Removal of the ovaries or uterus.  Contraceptive pills, patches, or vaginal rings.  Cushing syndrome.  Thyroid problems. What increases the risk? You are more likely to develop this condition if:  You have a family history of this condition.  You have an eating disorder.  You do extreme athletic training.  You have a chronic disease.  You abuse substances such as alcohol or cigarettes. What are the signs or symptoms? The main symptom of this condition is a lack of menstrual periods for 3-6 months. How is this diagnosed? This condition may be diagnosed based on:  Your medical history.  A physical exam.  A pelvic exam to check for problems with your reproductive organs.  A procedure to examine the uterus.  A measurement of your body mass index (BMI).  Tests, such as: ? Blood tests that measure certain hormones in your body and rule out pregnancy. ? Urine tests. ? Imaging tests, such as an ultrasound, CT scan, or MRI. How is this treated? Treatment  for this condition depends on the cause of the amenorrhea. It may involve:  Correcting dietary problems.  Treating underlying conditions.  Medicines.  Lifestyle changes.  Surgery. If the condition cannot be corrected, it is sometimes possible to trigger menstrual periods with medicines. Follow these instructions at home: Lifestyle  Maintain a healthy diet. Ask to meet with a registered dietitian for nutrition counseling and meal planning.  Maintain a healthy weight. Talk to your health care provider before trying any new diet or exercise plan.  Exercise at least 30 minutes 5 or more days each week. Exercising includes brisk walking, yard work, biking, running, swimming, and team sports like basketball and soccer. Ask your health care provider which exercises are safe for you.  Get enough sleep. Plan your sleep time to allow for 7-9 hours of sleep each night.  Learn to manage stress. Explore relaxation techniques such as meditation, journaling, yoga, or tai chi. General instructions  Be aware of changes in your menstrual cycle. Keep a record of when you have your menstrual period. Note the date your period starts, how long it lasts, and any problems you experience.  Take over-the-counter and prescription medicines only as told by your health care provider.  Keep all follow-up visits as told by your health care provider. This is important. Contact a health care provider if:  Your periods do not return to normal after treatment. Summary  Secondary amenorrhea is when a female who was previously having menstrual periods has not gotten her period for 3-6 months.  This condition has many causes. In many cases, treating the underlying   cause will return menstrual periods back to a normal cycle.  Talk to your health care provider if your periods do not return to normal after treatment. This information is not intended to replace advice given to you by your health care provider. Make  sure you discuss any questions you have with your health care provider. Document Revised: 09/27/2018 Document Reviewed: 07/02/2016 Elsevier Patient Education  2020 [employer]  

## 2020-03-28 LAB — TSH: TSH: 2.08 u[IU]/mL (ref 0.450–4.500)

## 2020-03-28 LAB — PROGESTERONE: Progesterone: <0.1 ng/mL

## 2020-03-28 LAB — TESTOSTERONE, FREE, TOTAL, SHBG
Sex Hormone Binding: 65.2 nmol/L (ref 24.6–122.0)
Testosterone, Free: 7.8 pg/mL — ABNORMAL HIGH (ref 0.0–4.2)
Testosterone: 26 ng/dL (ref 8–60)

## 2020-03-28 LAB — FSH/LH
FSH: 4.6 m[IU]/mL
LH: 11.7 m[IU]/mL

## 2020-03-28 LAB — PROLACTIN: Prolactin: 43 ng/mL — ABNORMAL HIGH (ref 4.8–23.3)

## 2020-03-28 LAB — ESTRADIOL: Estradiol: 22.3 pg/mL

## 2020-04-02 ENCOUNTER — Inpatient Hospital Stay (HOSPITAL_BASED_OUTPATIENT_CLINIC_OR_DEPARTMENT_OTHER): Payer: Medicare Other | Admitting: Internal Medicine

## 2020-04-02 ENCOUNTER — Inpatient Hospital Stay: Payer: Medicare Other

## 2020-04-02 ENCOUNTER — Other Ambulatory Visit: Payer: Self-pay

## 2020-04-02 ENCOUNTER — Inpatient Hospital Stay: Payer: Medicare Other | Attending: Internal Medicine

## 2020-04-02 ENCOUNTER — Encounter: Payer: Self-pay | Admitting: Internal Medicine

## 2020-04-02 DIAGNOSIS — Z992 Dependence on renal dialysis: Secondary | ICD-10-CM | POA: Insufficient documentation

## 2020-04-02 DIAGNOSIS — Z79899 Other long term (current) drug therapy: Secondary | ICD-10-CM | POA: Diagnosis not present

## 2020-04-02 DIAGNOSIS — D593 Hemolytic-uremic syndrome: Secondary | ICD-10-CM

## 2020-04-02 DIAGNOSIS — I120 Hypertensive chronic kidney disease with stage 5 chronic kidney disease or end stage renal disease: Secondary | ICD-10-CM | POA: Diagnosis not present

## 2020-04-02 DIAGNOSIS — D5939 Other hemolytic-uremic syndrome: Secondary | ICD-10-CM

## 2020-04-02 DIAGNOSIS — Z7682 Awaiting organ transplant status: Secondary | ICD-10-CM | POA: Diagnosis not present

## 2020-04-02 DIAGNOSIS — Z9641 Presence of insulin pump (external) (internal): Secondary | ICD-10-CM | POA: Diagnosis not present

## 2020-04-02 DIAGNOSIS — E1122 Type 2 diabetes mellitus with diabetic chronic kidney disease: Secondary | ICD-10-CM | POA: Diagnosis not present

## 2020-04-02 DIAGNOSIS — N186 End stage renal disease: Secondary | ICD-10-CM | POA: Diagnosis not present

## 2020-04-02 LAB — CBC WITH DIFFERENTIAL/PLATELET
Abs Immature Granulocytes: 0.04 10*3/uL (ref 0.00–0.07)
Basophils Absolute: 0.1 10*3/uL (ref 0.0–0.1)
Basophils Relative: 1 %
Eosinophils Absolute: 0.2 10*3/uL (ref 0.0–0.5)
Eosinophils Relative: 3 %
HCT: 35 % — ABNORMAL LOW (ref 36.0–46.0)
Hemoglobin: 11.2 g/dL — ABNORMAL LOW (ref 12.0–15.0)
Immature Granulocytes: 1 %
Lymphocytes Relative: 12 %
Lymphs Abs: 0.8 10*3/uL (ref 0.7–4.0)
MCH: 26.9 pg (ref 26.0–34.0)
MCHC: 32 g/dL (ref 30.0–36.0)
MCV: 84.1 fL (ref 80.0–100.0)
Monocytes Absolute: 0.4 10*3/uL (ref 0.1–1.0)
Monocytes Relative: 5 %
Neutro Abs: 5.6 10*3/uL (ref 1.7–7.7)
Neutrophils Relative %: 78 %
Platelets: 549 10*3/uL — ABNORMAL HIGH (ref 150–400)
RBC: 4.16 MIL/uL (ref 3.87–5.11)
RDW: 14.1 % (ref 11.5–15.5)
WBC: 7.1 10*3/uL (ref 4.0–10.5)
nRBC: 0 % (ref 0.0–0.2)

## 2020-04-02 LAB — COMPREHENSIVE METABOLIC PANEL
ALT: 11 U/L (ref 0–44)
AST: 12 U/L — ABNORMAL LOW (ref 15–41)
Albumin: 3.3 g/dL — ABNORMAL LOW (ref 3.5–5.0)
Alkaline Phosphatase: 103 U/L (ref 38–126)
Anion gap: 17 — ABNORMAL HIGH (ref 5–15)
BUN: 62 mg/dL — ABNORMAL HIGH (ref 6–20)
CO2: 22 mmol/L (ref 22–32)
Calcium: 7.6 mg/dL — ABNORMAL LOW (ref 8.9–10.3)
Chloride: 92 mmol/L — ABNORMAL LOW (ref 98–111)
Creatinine, Ser: 13.32 mg/dL — ABNORMAL HIGH (ref 0.44–1.00)
GFR, Estimated: 3 mL/min — ABNORMAL LOW (ref >60)
Glucose, Bld: 442 mg/dL — ABNORMAL HIGH (ref 70–99)
Potassium: 5.1 mmol/L (ref 3.5–5.1)
Sodium: 131 mmol/L — ABNORMAL LOW (ref 135–145)
Total Bilirubin: 0.3 mg/dL (ref 0.3–1.2)
Total Protein: 6.7 g/dL (ref 6.5–8.1)

## 2020-04-02 LAB — LACTATE DEHYDROGENASE: LDH: 214 U/L — ABNORMAL HIGH (ref 98–192)

## 2020-04-02 MED ORDER — SODIUM CHLORIDE 0.9 % IV SOLN
INTRAVENOUS | Status: DC
  Filled 2020-04-02: qty 250

## 2020-04-02 MED ORDER — SODIUM CHLORIDE 0.9 % IV SOLN
3300.0000 mg | Freq: Once | INTRAVENOUS | Status: AC
  Administered 2020-04-02: 3300 mg via INTRAVENOUS
  Filled 2020-04-02: qty 33

## 2020-04-02 NOTE — Progress Notes (Signed)
Stable at discharge 

## 2020-04-02 NOTE — Progress Notes (Signed)
[place] NOTE  Patient Care Team: Wandra Feinstein, MD as PCP - General (Family Medicine) Rubie Maid, MD as Obstetrician (Obstetrics and Gynecology) [name], FNP as Consulting Physician (Family Medicine) Azzie Glatter, MD as Consulting Physician (Internal Medicine) Sherryll Burger, MD as Consulting Physician (Nephrology) Cammie Sickle, MD as Consulting Physician (Internal Medicine)  CHIEF COMPLAINTS/PURPOSE OF CONSULTATION: Atypical HUS  # ATYPICAL HEMOLYTIC UREMIC SYNDROME [April 2019-Dx; [facility]; Dr.Arepally;s/p Plex x1; April 26th- Ecluzimab 1200 mg q 2W; march 25th 2020- Ultomiris q  8 weeks.   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   # WORK UP for HUS [[facility]] Phospholipase A2 Receptor AB/ S Phospholipase A2 Receptor IFA-NEGATIVE   Oncology History   No history exists.   HISTORY OF PRESENTING ILLNESS:  Kelly Huff 34 y.o.  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  Patient missed her infusion approximately 2 weeks ago because of admission to hospital for abdominal pain; CT scan suggestive of enteritis.   Patient otherwise is doing fairly well.  States she has not taken her Cozaar given the concerns of hyperkalemia.    Review of Systems  Constitutional: Positive for malaise/fatigue. Negative for chills, diaphoresis, fever and weight loss.  HENT: Negative for nosebleeds and sore throat.   Eyes: Negative for double vision.  Respiratory: Negative for cough, hemoptysis, sputum production, shortness of breath and wheezing.   Cardiovascular: Negative for chest pain, palpitations, orthopnea and leg swelling.  Gastrointestinal: Negative for abdominal  pain, blood in stool, constipation, diarrhea, heartburn, melena, nausea and vomiting.  Genitourinary: Negative for dysuria, frequency and urgency.  Musculoskeletal: Negative for back pain and joint pain.  Skin: Negative.  Negative for itching and rash.  Neurological: Negative for dizziness, tingling, focal weakness, weakness and headaches.  Endo/Heme/Allergies: Does not bruise/bleed easily.  Psychiatric/Behavioral: Negative for depression. The patient is not nervous/anxious and does not have insomnia.     MEDICAL HISTORY:  Past Medical History:  Diagnosis Date  . Chronic kidney disease   . Diabetes mellitus without complication ([place])   . History of pre-eclampsia 2016   mild  . Hypertension     SURGICAL HISTORY: Past Surgical History:  Procedure Laterality Date  . CESAREAN SECTION    . DILATION AND CURETTAGE OF UTERUS     x 4  . IR FLUORO GUIDE CV LINE RIGHT  12/05/2018  . IR FLUORO GUIDE CV LINE RIGHT  12/13/2018  . IR REMOVAL TUN CV CATH W/O FL  02/22/2019  . IR US GUIDE VASC ACCESS RIGHT  12/05/2018  . IR US GUIDE VASC ACCESS RIGHT  12/13/2018  . LAPAROSCOPIC APPENDECTOMY N/A 12/04/2019   Procedure: APPENDECTOMY LAPAROSCOPIC;  Surgeon: Donnie Mesa, MD;  Location: [facility];  Service: General;  Laterality: N/A;    SOCIAL HISTORY: Social History   Socioeconomic History  . Marital status: Married    Spouse name: Not on file  . Number of children: Not on file  . Years of education: Not on file  . Highest education level: Not on file  Occupational History  . Not on file  Tobacco Use  . Smoking status: Never Smoker  . Smokeless tobacco: Never Used  Vaping Use  . Vaping Use: Never used  Substance and Sexual Activity  . Alcohol use:  No  . Drug use: No  . Sexual activity: Yes    Birth control/protection: None, Surgical    Comment: tubial  Other Topics Concern  . Not on file  Social History Narrative  . Not on file   Social Determinants of Health   Financial Resource  Strain:   . Difficulty of Paying Living Expenses: Not on file  Food Insecurity:   . Worried About Charity fundraiser in the Last Year: Not on file  . Ran Out of Food in the Last Year: Not on file  Transportation Needs:   . Lack of Transportation (Medical): Not on file  . Lack of Transportation (Non-Medical): Not on file  Physical Activity:   . Days of Exercise per Week: Not on file  . Minutes of Exercise per Session: Not on file  Stress:   . Feeling of Stress : Not on file  Social Connections:   . Frequency of Communication with Friends and Family: Not on file  . Frequency of Social Gatherings with Friends and Family: Not on file  . Attends Religious Services: Not on file  . Active Member of Clubs or Organizations: Not on file  . Attends Archivist Meetings: Not on file  . Marital Status: Not on file  Intimate Partner Violence:   . Fear of Current or Ex-Partner: Not on file  . Emotionally Abused: Not on file  . Physically Abused: Not on file  . Sexually Abused: Not on file    FAMILY HISTORY: Family History  Problem Relation Age of Onset  . Hypertension Mother   . Cancer Mother        Uterine vs cervical (pt unsure),   . Schizophrenia Brother   . Breast cancer Neg Hx     ALLERGIES:  is allergic to morphine, sulfamethoxazole-trimethoprim, and trulicity [dulaglutide].  MEDICATIONS:  Current Outpatient Medications  Medication Sig Dispense Refill  . acetaminophen (TYLENOL) 325 MG tablet Take 2 tablets (650 mg total) by mouth every 4 (four) hours as needed for mild pain or fever.    Marland Kitchen amLODipine (NORVASC) 10 MG tablet Take 10 mg by mouth every evening.     . calcitRIOL (ROCALTROL) 0.25 MCG capsule Take 0.25 mcg by mouth daily.    . calcium acetate (PHOSLO) 667 MG tablet Take 2,001 mg by mouth 3 (three) times daily with meals.     . cinacalcet (SENSIPAR) 30 MG tablet Take 30 mg by mouth daily.    . cloNIDine (CATAPRES - DOSED IN MG/24 HR) 0.1 mg/24hr patch Place 0.1  mg onto the skin See admin instructions. Apply patch on every Sunday    . cyclobenzaprine (FLEXERIL) 10 MG tablet Take 10 mg by mouth at bedtime as needed for muscle spasms.     Marland Kitchen gentamicin cream (GARAMYCIN) 0.1 % Apply 1 application topically See admin instructions. Apply small amount to catheter exit site once daily    . insulin glargine (LANTUS SOLOSTAR) 100 UNIT/ML Solostar Pen     . insulin lispro (HUMALOG) 100 UNIT/ML injection Continue use of insulin pump with setting as previous: Restart in the morning of 03/23/2020 as Lantus was given on the morning of 03/22/2020. 10 mL 11  . losartan (COZAAR) 50 MG tablet Take 50 mg by mouth daily.    . metoprolol succinate (TOPROL-XL) 100 MG 24 hr tablet Take 100 mg by mouth 2 (two) times daily.    . sodium bicarbonate 650 MG tablet Take 1 tablet (650 mg total) by mouth 3 (three)  times daily. 120 tablet 0   No current facility-administered medications for this visit.   Facility-Administered Medications Ordered in Other Visits  Medication Dose Route Frequency Provider Last Rate Last Admin  . 0.9 %  sodium chloride infusion   Intravenous Continuous Cammie Sickle, MD   Stopped at 04/02/20 1237  . sodium chloride flush (NS) 0.9 % injection 10 mL  10 mL Intravenous PRN Cammie Sickle, MD       PHYSICAL EXAMINATION: ECOG PERFORMANCE STATUS: 1 - Symptomatic but completely ambulatory  Vitals:   04/02/20 0956  BP: (!) 148/100  Pulse: 89  Resp: 18  Temp: 98 F (36.7 C)  SpO2: 100%   Filed Weights   04/02/20 0956  Weight: 183 lb (83 kg)    Physical Exam Constitutional:      Comments: Patient is alone.  HENT:     Head: Normocephalic and atraumatic.     Mouth/Throat:     Pharynx: No oropharyngeal exudate.  Eyes:     Pupils: Pupils are equal, round, and reactive to light.  Cardiovascular:     Rate and Rhythm: Normal rate and regular rhythm.  Pulmonary:     Effort: No respiratory distress.     Breath sounds: No wheezing.   Abdominal:     General: Bowel sounds are normal. There is no distension.     Palpations: Abdomen is soft. There is no mass.     Tenderness: There is no abdominal tenderness. There is no guarding or rebound.  Musculoskeletal:        General: No tenderness. Normal range of motion.     Cervical back: Normal range of motion and neck supple.  Skin:    General: Skin is warm.  Neurological:     Mental Status: She is alert and oriented to person, place, and time.  Psychiatric:        Mood and Affect: Affect normal.   ;  LABORATORY DATA:  I have reviewed the data as listed Lab Results  Component Value Date   WBC 7.1 04/02/2020   HGB 11.2 (L) 04/02/2020   HCT 35.0 (L) 04/02/2020   MCV 84.1 04/02/2020   PLT 549 (H) 04/02/2020   Recent Labs    01/19/20 0954 01/19/20 0954 01/24/20 0927 01/24/20 0927 03/20/20 0113 03/20/20 0435 03/20/20 1613 03/20/20 1749 03/21/20 0355 03/21/20 0355 03/21/20 0958 03/22/20 0156 04/02/20 0848  NA 131*   < > 133*   < > 127*   < > QUESTIONABLE RESULTS, RECOMMEND RECOLLECT TO VERIFY   < > 134*   < > 133* 133* 131*  K 4.3   < > 3.8   < > 6.4*   < > QUESTIONABLE RESULTS, RECOMMEND RECOLLECT TO VERIFY   < > 4.0   < > 5.0 4.5 5.1  CL 90*   < > 93*   < > 95*   < > QUESTIONABLE RESULTS, RECOMMEND RECOLLECT TO VERIFY   < > 99   < > 98 94* 92*  CO2 23   < > 24   < > 16*   < > QUESTIONABLE RESULTS, RECOMMEND RECOLLECT TO VERIFY   < > 21*   < > 19* 18* 22  GLUCOSE 377*   < > 259*   < > 623*   < > QUESTIONABLE RESULTS, RECOMMEND RECOLLECT TO VERIFY   < > 167*   < > 108* 340* 442*  BUN 77*   < > 63*   < > 72*   < >  QUESTIONABLE RESULTS, RECOMMEND RECOLLECT TO VERIFY   < > 64*   < > 62* 63* 62*  CREATININE 18.91*   < > 17.75*   < > 16.18*   < > QUESTIONABLE RESULTS, RECOMMEND RECOLLECT TO VERIFY   < > 13.21*   < > 13.26* 13.68* 13.32*  CALCIUM 8.3*   < > 7.8*   < > 7.4*   < > QUESTIONABLE RESULTS, RECOMMEND RECOLLECT TO VERIFY   < > 7.8*   < > 7.6* 7.7* 7.6*   GFRNONAA 2*   < > 2*  --  3*   < > QUESTIONABLE RESULTS, RECOMMEND RECOLLECT TO VERIFY   < > 3*   < > 3* 3* 3*  GFRAA 2*  --  3*  --   --   --  QUESTIONABLE RESULTS, RECOMMEND RECOLLECT TO VERIFY  --   --   --   --   --   --   PROT 7.0   < > 6.3*   < > 6.2*  --   --   --  5.1*  --   --   --  6.7  ALBUMIN 3.4*   < > 3.2*   < > 3.1*  --   --   --  2.4*  --   --   --  3.3*  AST 12*   < > 11*   < > 9*  --   --   --  10*  --   --   --  12*  ALT 8   < > 9   < > 9  --   --   --  7  --   --   --  11  ALKPHOS 92   < > 81   < > 123  --   --   --  83  --   --   --  103  BILITOT 0.6   < > 0.6   < > 0.7  --   --   --  0.3  --   --   --  0.3   < > = values in this interval not displayed.    RADIOGRAPHIC STUDIES: I have personally reviewed the radiological images as listed and agreed with the findings in the report. CT ABDOMEN PELVIS WO CONTRAST  Result Date: 03/20/2020 CLINICAL DATA:  Abdominal pain with diarrhea EXAM: CT ABDOMEN AND PELVIS WITHOUT CONTRAST TECHNIQUE: Multidetector CT imaging of the abdomen and pelvis was performed following the standard protocol without IV contrast. COMPARISON:  None. FINDINGS: LOWER CHEST: Normal. HEPATOBILIARY: Small amount of anterior perihepatic free fluid. The gallbladder is normal. PANCREAS: Normal pancreas. No ductal dilatation or peripancreatic fluid collection. SPLEEN: Normal. ADRENALS/URINARY TRACT: The adrenal glands are normal. No hydronephrosis, nephroureterolithiasis or solid renal mass. The urinary bladder is normal for degree of distention STOMACH/BOWEL: There is no hiatal hernia. Normal duodenal course and caliber. No small bowel dilatation or inflammation. No focal colonic abnormality. Status post appendectomy. There is mesenteric edema in the upper abdomen. VASCULAR/LYMPHATIC: Normal course and caliber of the major abdominal vessels. No abdominal or pelvic lymphadenopathy. REPRODUCTIVE: 4.1 cm left adnexal cyst.  Normal uterus. MUSCULOSKELETAL. No bony spinal  canal stenosis or focal osseous abnormality. OTHER: Small amount of free fluid in the pelvis. IMPRESSION: 1. Mesenteric edema in the upper abdomen may be due to volume overload in patient on peritoneal dialysis. Enteritis is also a possibility. 2. Small volume perihepatic and pelvic free fluid is not unexpected  in the setting of peritoneal dialysis. 3. 4.1 cm left adnexal cyst. No follow-up imaging recommended. Note: This recommendation does not apply to premenarchal patients and to those with increased risk (genetic, family history, elevated tumor markers or other high-risk factors) of ovarian cancer. Reference: JACR 2020 Feb; 17(2):248-254 Electronically Signed   By: Ulyses Jarred M.D.   On: 03/20/2020 02:42    ASSESSMENT & PLAN:   HUS (hemolytic uremic syndrome), atypical ([place]) # Atypical hemolytic uremic syndrome -on Ultimoris infusions.  Stable.  Hb -8.8 [see below]  Platelets/LDH are normal.  Continue Ultimoris infusions every 8 weeks; continue indefinitely at this time.  STABLE.   # Anemia- multifactorial- 11.4 . [do not suspect secondary to atypical HUS]; likely secondary to ESRD  on procrit/Iron; patient's Procrit/iron delay secondary to acute issues.  Continue prn per nephrology.   # ESRD- on PD- STABLE  [awaiting transplant list].   # Hypertension-142/100-Overall  Stable;  Recommend compliance with medications. [amlodipine; losartan-did not take; on metoprolol]  # Poorly controlled DM-442-; on insulin pump; not controlled; defer to endocrinology [Dr.Kerr].   # DISPOSITION: # Treatment today # follow up in 8 weeks- MD- labs-cbc.cmp'LDH; haptologin; infusion-Ultomiris-Dr.B  All questions were answered. The patient knows to call the clinic with any problems, questions or concerns.    Cammie Sickle, MD 04/02/2020 1:11 PM

## 2020-04-02 NOTE — Assessment & Plan Note (Signed)
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  Stable.  Hb -8.8 [see below]  Platelets/LDH are normal.  Continue Ultimoris infusions every 8 weeks; continue indefinitely at this time.  STABLE.   # Anemia- multifactorial- 11.4 . [do not suspect secondary to atypical HUS]; likely secondary to ESRD  on procrit/Iron; patient's Procrit/iron delay secondary to acute issues.  Continue prn per nephrology.   # ESRD- on PD- STABLE  [awaiting transplant list].   # Hypertension-142/100-Overall  Stable;  Recommend compliance with medications. [amlodipine; losartan-did not take; on metoprolol]  # Poorly controlled DM-442-; on insulin pump; not controlled; defer to endocrinology [Dr.Kerr].   # DISPOSITION: # Treatment today # follow up in 8 weeks- MD- labs-cbc.cmp'LDH; haptologin; infusion-Ultomiris-Dr.B

## 2020-04-02 NOTE — Progress Notes (Signed)
Pt in for follow up denies any concerns today. Pt BP elevated reports has taken medications today.

## 2020-04-03 NOTE — Addendum Note (Signed)
Addended by: Delice Bison E on: 04/03/2020 08:31 AM   Modules accepted: Orders

## 2020-04-16 ENCOUNTER — Telehealth: Payer: Self-pay

## 2020-04-16 NOTE — Telephone Encounter (Signed)
NOTES ON FILE FROM [facility] [phone], SENT REFERRAL TO SCHEDULING 

## 2020-04-16 NOTE — Telephone Encounter (Signed)
NOTES ON FILE FROM [facility] [phone], SENT REFERRAL TO SCHEDULING 

## 2020-05-21 ENCOUNTER — Other Ambulatory Visit: Payer: Self-pay

## 2020-05-21 ENCOUNTER — Inpatient Hospital Stay: Payer: Medicare Other | Attending: Internal Medicine | Admitting: Internal Medicine

## 2020-05-21 ENCOUNTER — Inpatient Hospital Stay: Payer: Medicare Other

## 2020-05-21 ENCOUNTER — Telehealth: Payer: Self-pay

## 2020-05-21 VITALS — BP 165/98 | HR 81 | Temp 97.5°F | Resp 20 | Ht 67.0 in | Wt 189.0 lb

## 2020-05-21 DIAGNOSIS — D5939 Other hemolytic-uremic syndrome: Secondary | ICD-10-CM

## 2020-05-21 DIAGNOSIS — N186 End stage renal disease: Secondary | ICD-10-CM | POA: Diagnosis not present

## 2020-05-21 DIAGNOSIS — I120 Hypertensive chronic kidney disease with stage 5 chronic kidney disease or end stage renal disease: Secondary | ICD-10-CM | POA: Diagnosis not present

## 2020-05-21 DIAGNOSIS — D509 Iron deficiency anemia, unspecified: Secondary | ICD-10-CM

## 2020-05-21 DIAGNOSIS — O99019 Anemia complicating pregnancy, unspecified trimester: Secondary | ICD-10-CM

## 2020-05-21 DIAGNOSIS — Z79899 Other long term (current) drug therapy: Secondary | ICD-10-CM | POA: Diagnosis not present

## 2020-05-21 DIAGNOSIS — D593 Hemolytic-uremic syndrome: Secondary | ICD-10-CM | POA: Diagnosis not present

## 2020-05-21 DIAGNOSIS — E1122 Type 2 diabetes mellitus with diabetic chronic kidney disease: Secondary | ICD-10-CM | POA: Insufficient documentation

## 2020-05-21 DIAGNOSIS — Z794 Long term (current) use of insulin: Secondary | ICD-10-CM | POA: Diagnosis not present

## 2020-05-21 DIAGNOSIS — Z7682 Awaiting organ transplant status: Secondary | ICD-10-CM | POA: Diagnosis not present

## 2020-05-21 LAB — COMPREHENSIVE METABOLIC PANEL
ALT: 8 U/L (ref 0–44)
AST: 9 U/L — ABNORMAL LOW (ref 15–41)
Albumin: 3.4 g/dL — ABNORMAL LOW (ref 3.5–5.0)
Alkaline Phosphatase: 118 U/L (ref 38–126)
Anion gap: 13 (ref 5–15)
BUN: 74 mg/dL — ABNORMAL HIGH (ref 6–20)
CO2: 23 mmol/L (ref 22–32)
Calcium: 8.4 mg/dL — ABNORMAL LOW (ref 8.9–10.3)
Chloride: 95 mmol/L — ABNORMAL LOW (ref 98–111)
Creatinine, Ser: 14.17 mg/dL — ABNORMAL HIGH (ref 0.44–1.00)
GFR, Estimated: 3 mL/min — ABNORMAL LOW (ref >60)
Glucose, Bld: 199 mg/dL — ABNORMAL HIGH (ref 70–99)
Potassium: 5.4 mmol/L — ABNORMAL HIGH (ref 3.5–5.1)
Sodium: 131 mmol/L — ABNORMAL LOW (ref 135–145)
Total Bilirubin: 0.5 mg/dL (ref 0.3–1.2)
Total Protein: 6.6 g/dL (ref 6.5–8.1)

## 2020-05-21 LAB — CBC WITH DIFFERENTIAL/PLATELET
Abs Immature Granulocytes: 0.02 10*3/uL (ref 0.00–0.07)
Basophils Absolute: 0 10*3/uL (ref 0.0–0.1)
Basophils Relative: 1 %
Eosinophils Absolute: 0.2 10*3/uL (ref 0.0–0.5)
Eosinophils Relative: 3 %
HCT: 29.9 % — ABNORMAL LOW (ref 36.0–46.0)
Hemoglobin: 9.9 g/dL — ABNORMAL LOW (ref 12.0–15.0)
Immature Granulocytes: 0 %
Lymphocytes Relative: 18 %
Lymphs Abs: 0.9 10*3/uL (ref 0.7–4.0)
MCH: 26.7 pg (ref 26.0–34.0)
MCHC: 33.1 g/dL (ref 30.0–36.0)
MCV: 80.6 fL (ref 80.0–100.0)
Monocytes Absolute: 0.4 10*3/uL (ref 0.1–1.0)
Monocytes Relative: 7 %
Neutro Abs: 3.6 10*3/uL (ref 1.7–7.7)
Neutrophils Relative %: 71 %
Platelets: 324 10*3/uL (ref 150–400)
RBC: 3.71 MIL/uL — ABNORMAL LOW (ref 3.87–5.11)
RDW: 13.9 % (ref 11.5–15.5)
WBC: 5.1 10*3/uL (ref 4.0–10.5)
nRBC: 0 % (ref 0.0–0.2)

## 2020-05-21 LAB — IRON AND TIBC
Iron: 107 ug/dL (ref 28–170)
Saturation Ratios: 49 % — ABNORMAL HIGH (ref 10.4–31.8)
TIBC: 218 ug/dL — ABNORMAL LOW (ref 250–450)
UIBC: 111 ug/dL

## 2020-05-21 LAB — FERRITIN: Ferritin: 967 ng/mL — ABNORMAL HIGH (ref 11–307)

## 2020-05-21 LAB — LACTATE DEHYDROGENASE: LDH: 171 U/L (ref 98–192)

## 2020-05-21 NOTE — Assessment & Plan Note (Addendum)
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  Stable.  Hb -8.8 [see below]  Platelets/LDH are normal.  Continue Ultimoris infusions every 8 weeks; continue indefinitely at this time. STABLE' hold infusion today as she is 1 week earlier.  Will reschedule for next week  # Anemia- multifactorial- 9.9. [do not suspect secondary to atypical HUS]; likely secondary to ESRD  on procrit/Iron; patient's Procrit/iron.  Check iron studies today.  Will check haptoglobin at next visit continue prn per nephrology.   # ESRD- on PD- STABLE  [awaiting transplant list].   # Hypertension-168/98-Overall  STABLE Recommend compliance with medications. [amlodipine; losartan-did not take; on metoprolol]  # Poorly controlled DM-199; STABLE; on insulin pump;[Dr.Kerr].   #Prophylactic meningococcal vaccination-patient received vaccination prior to starting therapy; will check regarding the schedule.   # DISPOSITION: # HOLD Treatment today # re-schedule the treatment by 1 week; NO labs needed # follow up in 9 weeks- MD- labs-cbc.cmp'LDH; haptologin; infusion-Ultomiris-Dr.B

## 2020-05-21 NOTE — Telephone Encounter (Signed)
Patient called in stating that she needs orders placed for her to have her mammogram completed. Informed patient I would send a message back to her providers nurse and to a allow 24-72 hours for the nurse to get back in touch with her. Patient verbalized understanding.

## 2020-05-21 NOTE — Progress Notes (Signed)
[facility] NOTE  Patient Care Team: Wandra Feinstein, MD as PCP - General (Family Medicine) Rubie Maid, MD as Obstetrician (Obstetrics and Gynecology) [name], FNP as Consulting Physician (Family Medicine) Azzie Glatter, MD as Consulting Physician (Internal Medicine) Sherryll Burger, MD as Consulting Physician (Nephrology) Cammie Sickle, MD as Consulting Physician (Internal Medicine)  CHIEF COMPLAINTS/PURPOSE OF CONSULTATION: Atypical HUS  # ATYPICAL HEMOLYTIC UREMIC SYNDROME [April 2019-Dx; [facility]; Dr.Arepally;s/p Plex x1; April 26th- Ecluzimab 1200 mg q 2W; march 25th 2020- Ultomiris q  8 weeks.   # AKI/CKD [name] 2018;FEB 2020- hemodialysis [[facility] in [place]; Tuesday Thursday Saturday]; May 2020-peritoneal dialysis  # IDDM [since age of 44- Dr.Kerr,/Endo]; SEP 2021- COVID s/p monoclonal antibody therapy.   # WORK UP for HUS [[facility]] Phospholipase A2 Receptor AB/ S Phospholipase A2 Receptor IFA-NEGATIVE   Oncology History   No history exists.   HISTORY OF PRESENTING ILLNESS:  Kelly Huff 35 y.o.  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  No acute events in the interim.  No hospitalizations.  Patient states her blood pressures run better at home.  Otherwise no leg swelling.  No nausea vomiting.  No headaches.  Review of Systems  Constitutional: Positive for malaise/fatigue. Negative for chills, diaphoresis, fever and weight loss.  HENT: Negative for nosebleeds and sore throat.   Eyes: Negative for double vision.  Respiratory: Negative for cough, hemoptysis, sputum production, shortness of breath and wheezing.   Cardiovascular: Negative for chest pain, palpitations, orthopnea and leg swelling.  Gastrointestinal: Negative for abdominal pain, blood in stool, constipation, diarrhea, heartburn, melena, nausea and vomiting.   Genitourinary: Negative for dysuria, frequency and urgency.  Musculoskeletal: Negative for back pain and joint pain.  Skin: Negative.  Negative for itching and rash.  Neurological: Negative for dizziness, tingling, focal weakness, weakness and headaches.  Endo/Heme/Allergies: Does not bruise/bleed easily.  Psychiatric/Behavioral: Negative for depression. The patient is not nervous/anxious and does not have insomnia.     MEDICAL HISTORY:  Past Medical History:  Diagnosis Date  . Chronic kidney disease   . Diabetes mellitus without complication ([place])   . History of pre-eclampsia 2016   mild  . Hypertension     SURGICAL HISTORY: Past Surgical History:  Procedure Laterality Date  . CESAREAN SECTION    . DILATION AND CURETTAGE OF UTERUS     x 4  . IR FLUORO GUIDE CV LINE RIGHT  12/05/2018  . IR FLUORO GUIDE CV LINE RIGHT  12/13/2018  . IR REMOVAL TUN CV CATH W/O FL  02/22/2019  . IR US GUIDE VASC ACCESS RIGHT  12/05/2018  . IR US GUIDE VASC ACCESS RIGHT  12/13/2018  . LAPAROSCOPIC APPENDECTOMY N/A 12/04/2019   Procedure: APPENDECTOMY LAPAROSCOPIC;  Surgeon: Donnie Mesa, MD;  Location: [facility];  Service: General;  Laterality: N/A;    SOCIAL HISTORY: Social History   Socioeconomic History  . Marital status: Married    Spouse name: Not on file  . Number of children: Not on file  . Years of education: Not on file  . Highest education level: Not on file  Occupational History  . Not on file  Tobacco Use  . Smoking status: Never Smoker  . Smokeless tobacco: Never Used  Vaping Use  . Vaping Use: Never used  Substance and Sexual Activity  . Alcohol use: No  . Drug use: No  . Sexual activity: Yes  Birth control/protection: None, Surgical    Comment: tubial  Other Topics Concern  . Not on file  Social History Narrative  . Not on file   Social Determinants of Health   Financial Resource Strain: Not on file  Food Insecurity: Not on file  Transportation Needs: Not on file   Physical Activity: Not on file  Stress: Not on file  Social Connections: Not on file  Intimate Partner Violence: Not on file    FAMILY HISTORY: Family History  Problem Relation Age of Onset  . Hypertension Mother   . Cancer Mother        Uterine vs cervical (pt unsure),   . Schizophrenia Brother   . Breast cancer Neg Hx     ALLERGIES:  is allergic to morphine, sulfamethoxazole-trimethoprim, covid-19 (mrna) vaccine, other, and trulicity [dulaglutide].  MEDICATIONS:  Current Outpatient Medications  Medication Sig Dispense Refill  . acetaminophen (TYLENOL) 325 MG tablet Take 2 tablets (650 mg total) by mouth every 4 (four) hours as needed for mild pain or fever.    Marland Kitchen amLODipine (NORVASC) 10 MG tablet Take 10 mg by mouth every evening.     . calcitRIOL (ROCALTROL) 0.25 MCG capsule Take 0.25 mcg by mouth daily.    . calcium acetate (PHOSLO) 667 MG tablet Take 2,001 mg by mouth 3 (three) times daily with meals.     . cinacalcet (SENSIPAR) 30 MG tablet Take 30 mg by mouth daily.    . cloNIDine (CATAPRES - DOSED IN MG/24 HR) 0.1 mg/24hr patch Place 0.1 mg onto the skin See admin instructions. Apply patch on every Sunday    . cyclobenzaprine (FLEXERIL) 10 MG tablet Take 10 mg by mouth at bedtime as needed for muscle spasms.     Marland Kitchen gentamicin cream (GARAMYCIN) 0.1 % Apply 1 application topically See admin instructions. Apply small amount to catheter exit site once daily    . insulin glargine (LANTUS SOLOSTAR) 100 UNIT/ML Solostar Pen     . insulin lispro (HUMALOG) 100 UNIT/ML injection Continue use of insulin pump with setting as previous: Restart in the morning of 03/23/2020 as Lantus was given on the morning of 03/22/2020. 10 mL 11  . losartan (COZAAR) 50 MG tablet Take 50 mg by mouth daily.    . metoprolol succinate (TOPROL-XL) 100 MG 24 hr tablet Take 100 mg by mouth 2 (two) times daily.    . sodium bicarbonate 650 MG tablet Take 1 tablet (650 mg total) by mouth 3 (three) times daily. 120  tablet 0   No current facility-administered medications for this visit.   Facility-Administered Medications Ordered in Other Visits  Medication Dose Route Frequency Provider Last Rate Last Admin  . sodium chloride flush (NS) 0.9 % injection 10 mL  10 mL Intravenous PRN Cammie Sickle, MD       PHYSICAL EXAMINATION: ECOG PERFORMANCE STATUS: 1 - Symptomatic but completely ambulatory  Vitals:   05/21/20 0909  BP: (!) 165/98  Pulse: 81  Resp: 20  Temp: (!) 97.5 F (36.4 C)   Filed Weights   05/21/20 0909  Weight: 189 lb (85.7 kg)    Physical Exam Constitutional:      Comments: Patient is alone.  HENT:     Head: Normocephalic and atraumatic.     Mouth/Throat:     Pharynx: No oropharyngeal exudate.  Eyes:     Pupils: Pupils are equal, round, and reactive to light.  Cardiovascular:     Rate and Rhythm: Normal rate and regular rhythm.  Pulmonary:     Effort: No respiratory distress.     Breath sounds: No wheezing.  Abdominal:     General: Bowel sounds are normal. There is no distension.     Palpations: Abdomen is soft. There is no mass.     Tenderness: There is no abdominal tenderness. There is no guarding or rebound.  Musculoskeletal:        General: No tenderness. Normal range of motion.     Cervical back: Normal range of motion and neck supple.  Skin:    General: Skin is warm.  Neurological:     Mental Status: She is alert and oriented to person, place, and time.  Psychiatric:        Mood and Affect: Affect normal.   ;  LABORATORY DATA:  I have reviewed the data as listed Lab Results  Component Value Date   WBC 5.1 05/21/2020   HGB 9.9 (L) 05/21/2020   HCT 29.9 (L) 05/21/2020   MCV 80.6 05/21/2020   PLT 324 05/21/2020   Recent Labs    01/19/20 0954 01/24/20 0927 03/20/20 0113 03/20/20 1613 03/20/20 1749 03/21/20 0355 03/21/20 0958 03/22/20 0156 04/02/20 0848 05/21/20 0858  NA 131* 133*   < > QUESTIONABLE RESULTS, RECOMMEND RECOLLECT TO  VERIFY   < > 134*   < > 133* 131* 131*  K 4.3 3.8   < > QUESTIONABLE RESULTS, RECOMMEND RECOLLECT TO VERIFY   < > 4.0   < > 4.5 5.1 5.4*  CL 90* 93*   < > QUESTIONABLE RESULTS, RECOMMEND RECOLLECT TO VERIFY   < > 99   < > 94* 92* 95*  CO2 23 24   < > QUESTIONABLE RESULTS, RECOMMEND RECOLLECT TO VERIFY   < > 21*   < > 18* 22 23  GLUCOSE 377* 259*   < > QUESTIONABLE RESULTS, RECOMMEND RECOLLECT TO VERIFY   < > 167*   < > 340* 442* 199*  BUN 77* 63*   < > QUESTIONABLE RESULTS, RECOMMEND RECOLLECT TO VERIFY   < > 64*   < > 63* 62* 74*  CREATININE 18.91* 17.75*   < > QUESTIONABLE RESULTS, RECOMMEND RECOLLECT TO VERIFY   < > 13.21*   < > 13.68* 13.32* 14.17*  CALCIUM 8.3* 7.8*   < > QUESTIONABLE RESULTS, RECOMMEND RECOLLECT TO VERIFY   < > 7.8*   < > 7.7* 7.6* 8.4*  GFRNONAA 2* 2*   < > QUESTIONABLE RESULTS, RECOMMEND RECOLLECT TO VERIFY   < > 3*   < > 3* 3* 3*  GFRAA 2* 3*  --  QUESTIONABLE RESULTS, RECOMMEND RECOLLECT TO VERIFY  --   --   --   --   --   --   PROT 7.0 6.3*   < >  --   --  5.1*  --   --  6.7 6.6  ALBUMIN 3.4* 3.2*   < >  --   --  2.4*  --   --  3.3* 3.4*  AST 12* 11*   < >  --   --  10*  --   --  12* 9*  ALT 8 9   < >  --   --  7  --   --  11 8  ALKPHOS 92 81   < >  --   --  83  --   --  103 118  BILITOT 0.6 0.6   < >  --   --  0.3  --   --  0.3 0.5   < > = values in this interval not displayed.    RADIOGRAPHIC STUDIES: I have personally reviewed the radiological images as listed and agreed with the findings in the report. No results found.  ASSESSMENT & PLAN:   HUS (hemolytic uremic syndrome), atypical ([place]) # Atypical hemolytic uremic syndrome -on Ultimoris infusions.  Stable.  Hb -8.8 [see below]  Platelets/LDH are normal.  Continue Ultimoris infusions every 8 weeks; continue indefinitely at this time. STABLE' hold infusion today as she is 1 week earlier.  Will reschedule for next week  # Anemia- multifactorial- 9.9. [do not suspect secondary to atypical HUS]; likely  secondary to ESRD  on procrit/Iron; patient's Procrit/iron.  Check iron studies today.  Will check haptoglobin at next visit continue prn per nephrology.   # ESRD- on PD- STABLE  [awaiting transplant list].   # Hypertension-168/98-Overall  STABLE Recommend compliance with medications. [amlodipine; losartan-did not take; on metoprolol]  # Poorly controlled DM-199; STABLE; on insulin pump;[Dr.Kerr].   #Prophylactic meningococcal vaccination-patient received vaccination prior to starting therapy; will check regarding the schedule.   # DISPOSITION: # HOLD Treatment today # re-schedule the treatment by 1 week; NO labs needed # follow up in 9 weeks- MD- labs-cbc.cmp'LDH; haptologin; infusion-Ultomiris-Dr.B  All questions were answered. The patient knows to call the clinic with any problems, questions or concerns.    Cammie Sickle, MD 05/21/2020 10:07 AM

## 2020-05-22 ENCOUNTER — Other Ambulatory Visit: Payer: Self-pay | Admitting: Obstetrics and Gynecology

## 2020-05-22 DIAGNOSIS — Z1231 Encounter for screening mammogram for malignant neoplasm of breast: Secondary | ICD-10-CM

## 2020-05-22 DIAGNOSIS — Z8481 Family history of carrier of genetic disease: Secondary | ICD-10-CM

## 2020-05-22 NOTE — Telephone Encounter (Signed)
Informed pt that I had spoken to [name] at [facility] and was informed that an order was placed for her mammogram and she Kelly Huff) was not sure why the she (patient) was not able to make an appointment. Pt stated that she did not say that she had any current breast issues and will call back to speak to [place] at [facility] to help her with scheduling.

## 2020-05-22 NOTE — Telephone Encounter (Signed)
Spoke to pt and informed her that she had an order placed for a mammogram pending from her last visit with [facility]. Pt stated that she called to schedule an appointment and was informed that she needed an order placed. Pt is aware that I would contact [facility] to check on the mammogram order.

## 2020-05-22 NOTE — Telephone Encounter (Signed)
Spoke to [employer] Roselyn Reef) she stated that they could see an order placed by [facility] and not sure why the patient was not scheduled. Roselyn Reef did stated that if the pt stated that she was having any current breast issues that was not in the order it would stop them from scheduling her also it could be an insurance issues due to the patient is not 35 years of age.

## 2020-05-28 ENCOUNTER — Other Ambulatory Visit: Payer: Self-pay | Admitting: Internal Medicine

## 2020-05-28 ENCOUNTER — Inpatient Hospital Stay: Payer: Medicare Other | Attending: Internal Medicine

## 2020-05-28 VITALS — BP 146/85 | HR 82 | Temp 97.4°F

## 2020-05-28 DIAGNOSIS — D593 Hemolytic-uremic syndrome: Secondary | ICD-10-CM | POA: Insufficient documentation

## 2020-05-28 DIAGNOSIS — D5939 Other hemolytic-uremic syndrome: Secondary | ICD-10-CM

## 2020-05-28 DIAGNOSIS — Z992 Dependence on renal dialysis: Secondary | ICD-10-CM | POA: Insufficient documentation

## 2020-05-28 DIAGNOSIS — N186 End stage renal disease: Secondary | ICD-10-CM | POA: Diagnosis not present

## 2020-05-28 MED ORDER — SODIUM CHLORIDE 0.9 % IV SOLN
INTRAVENOUS | Status: DC
  Filled 2020-05-28: qty 250

## 2020-05-28 MED ORDER — SODIUM CHLORIDE 0.9 % IV SOLN
3300.0000 mg | Freq: Once | INTRAVENOUS | Status: AC
  Administered 2020-05-28: 3300 mg via INTRAVENOUS
  Filled 2020-05-28: qty 33

## 2020-06-14 ENCOUNTER — Inpatient Hospital Stay
Admission: RE | Admit: 2020-06-14 | Discharge: 2020-06-14 | Disposition: A | Discharge status: Home / Self Care | Payer: Self-pay | Source: Ambulatory Visit | Attending: *Deleted | Admitting: *Deleted

## 2020-06-14 ENCOUNTER — Other Ambulatory Visit: Payer: Self-pay

## 2020-06-14 ENCOUNTER — Ambulatory Visit
Admission: RE | Admit: 2020-06-14 | Discharge: 2020-06-14 | Disposition: A | Discharge status: Home / Self Care | Payer: Medicare Other | Source: Ambulatory Visit | Attending: Obstetrics and Gynecology | Admitting: Obstetrics and Gynecology

## 2020-06-14 ENCOUNTER — Other Ambulatory Visit: Payer: Self-pay | Admitting: *Deleted

## 2020-06-14 DIAGNOSIS — Z1231 Encounter for screening mammogram for malignant neoplasm of breast: Secondary | ICD-10-CM

## 2020-06-14 DIAGNOSIS — Z8481 Family history of carrier of genetic disease: Secondary | ICD-10-CM | POA: Diagnosis not present

## 2020-06-17 ENCOUNTER — Telehealth: Payer: Self-pay | Admitting: *Deleted

## 2020-06-17 ENCOUNTER — Telehealth: Payer: Self-pay | Admitting: Internal Medicine

## 2020-06-17 NOTE — Telephone Encounter (Signed)
Provider aware of message below. Dr. B will contact pt directly shortly.

## 2020-06-17 NOTE — Telephone Encounter (Signed)
Patient called wanting to speak with Dr Rogue Bussing regarding her HGB dropping to 8 even after iron injection.

## 2020-06-17 NOTE — Telephone Encounter (Signed)
On 1/21-I tried to reach the patient; unable to leave message/voicemail full.  H/T-please reach out to the patient in the morning; will discuss further.  GB

## 2020-06-18 ENCOUNTER — Encounter: Payer: Self-pay | Admitting: Internal Medicine

## 2020-06-18 NOTE — Telephone Encounter (Signed)
Unable to leave vm for Home phone line - mail box is full; I called the patient's cell phone and patient answered. Dr. B spoke with patient.

## 2020-06-19 ENCOUNTER — Telehealth: Payer: Self-pay | Admitting: Internal Medicine

## 2020-06-19 DIAGNOSIS — R6889 Other general symptoms and signs: Secondary | ICD-10-CM

## 2020-06-19 DIAGNOSIS — D521 Drug-induced folate deficiency anemia: Secondary | ICD-10-CM

## 2020-06-19 DIAGNOSIS — E611 Iron deficiency: Secondary | ICD-10-CM

## 2020-06-19 DIAGNOSIS — D5939 Other hemolytic-uremic syndrome: Secondary | ICD-10-CM

## 2020-06-19 DIAGNOSIS — R5383 Other fatigue: Secondary | ICD-10-CM

## 2020-06-19 DIAGNOSIS — D593 Hemolytic-uremic syndrome: Secondary | ICD-10-CM

## 2020-06-19 NOTE — Telephone Encounter (Signed)
On 2/22-spoke to patient regarding her concerns for anemia in spite of EPO injection/iron injections.  Most recent hemoglobin 8.8.  Recommend checking CBC/LDH/haptoglobin/review of peripheral smear ETC  [labs ordered  C]- please schedule ASAP.   GB

## 2020-06-20 NOTE — Addendum Note (Signed)
Addended by: Gloris Ham on: 06/20/2020 09:03 AM   Modules accepted: Orders

## 2020-06-20 NOTE — Telephone Encounter (Signed)
done

## 2020-06-21 ENCOUNTER — Inpatient Hospital Stay: Payer: Medicare Other

## 2020-07-16 ENCOUNTER — Other Ambulatory Visit: Payer: Medicare Other

## 2020-07-16 ENCOUNTER — Ambulatory Visit: Payer: Medicare Other | Admitting: Internal Medicine

## 2020-07-16 ENCOUNTER — Ambulatory Visit: Payer: Medicare Other

## 2020-07-23 ENCOUNTER — Inpatient Hospital Stay: Payer: Medicare Other

## 2020-07-23 ENCOUNTER — Inpatient Hospital Stay: Payer: Medicare Other | Admitting: Internal Medicine

## 2020-07-25 ENCOUNTER — Inpatient Hospital Stay: Payer: Medicare Other | Attending: Internal Medicine | Admitting: Internal Medicine

## 2020-07-25 ENCOUNTER — Inpatient Hospital Stay: Payer: Medicare Other

## 2020-07-25 ENCOUNTER — Encounter: Payer: Self-pay | Admitting: Internal Medicine

## 2020-07-25 DIAGNOSIS — E119 Type 2 diabetes mellitus without complications: Secondary | ICD-10-CM | POA: Insufficient documentation

## 2020-07-25 DIAGNOSIS — D5939 Other hemolytic-uremic syndrome: Secondary | ICD-10-CM

## 2020-07-25 DIAGNOSIS — N186 End stage renal disease: Secondary | ICD-10-CM | POA: Diagnosis not present

## 2020-07-25 DIAGNOSIS — I10 Essential (primary) hypertension: Secondary | ICD-10-CM | POA: Diagnosis not present

## 2020-07-25 DIAGNOSIS — D521 Drug-induced folate deficiency anemia: Secondary | ICD-10-CM

## 2020-07-25 DIAGNOSIS — R6889 Other general symptoms and signs: Secondary | ICD-10-CM

## 2020-07-25 DIAGNOSIS — Z7682 Awaiting organ transplant status: Secondary | ICD-10-CM | POA: Insufficient documentation

## 2020-07-25 DIAGNOSIS — Z79899 Other long term (current) drug therapy: Secondary | ICD-10-CM | POA: Diagnosis not present

## 2020-07-25 DIAGNOSIS — E611 Iron deficiency: Secondary | ICD-10-CM

## 2020-07-25 DIAGNOSIS — D593 Hemolytic-uremic syndrome: Secondary | ICD-10-CM

## 2020-07-25 DIAGNOSIS — Z794 Long term (current) use of insulin: Secondary | ICD-10-CM | POA: Insufficient documentation

## 2020-07-25 DIAGNOSIS — Z9641 Presence of insulin pump (external) (internal): Secondary | ICD-10-CM | POA: Insufficient documentation

## 2020-07-25 DIAGNOSIS — Z5112 Encounter for antineoplastic immunotherapy: Secondary | ICD-10-CM | POA: Insufficient documentation

## 2020-07-25 DIAGNOSIS — D509 Iron deficiency anemia, unspecified: Secondary | ICD-10-CM

## 2020-07-25 DIAGNOSIS — Z8049 Family history of malignant neoplasm of other genital organs: Secondary | ICD-10-CM | POA: Diagnosis not present

## 2020-07-25 DIAGNOSIS — Z992 Dependence on renal dialysis: Secondary | ICD-10-CM | POA: Diagnosis not present

## 2020-07-25 DIAGNOSIS — Z8249 Family history of ischemic heart disease and other diseases of the circulatory system: Secondary | ICD-10-CM | POA: Insufficient documentation

## 2020-07-25 DIAGNOSIS — R5383 Other fatigue: Secondary | ICD-10-CM

## 2020-07-25 LAB — IRON AND TIBC
Iron: 116 ug/dL (ref 28–170)
Saturation Ratios: 47 % — ABNORMAL HIGH (ref 10.4–31.8)
TIBC: 246 ug/dL — ABNORMAL LOW (ref 250–450)
UIBC: 130 ug/dL

## 2020-07-25 LAB — RETICULOCYTES
Immature Retic Fract: 6.6 % (ref 2.3–15.9)
RBC.: 3.97 MIL/uL (ref 3.87–5.11)
Retic Count, Absolute: 71.5 10*3/uL (ref 19.0–186.0)
Retic Ct Pct: 1.8 % (ref 0.4–3.1)

## 2020-07-25 LAB — CBC WITH DIFFERENTIAL/PLATELET
Abs Immature Granulocytes: 0.02 10*3/uL (ref 0.00–0.07)
Basophils Absolute: 0.1 10*3/uL (ref 0.0–0.1)
Basophils Relative: 1 %
Eosinophils Absolute: 0.3 10*3/uL (ref 0.0–0.5)
Eosinophils Relative: 5 %
HCT: 33.1 % — ABNORMAL LOW (ref 36.0–46.0)
Hemoglobin: 11.1 g/dL — ABNORMAL LOW (ref 12.0–15.0)
Immature Granulocytes: 0 %
Lymphocytes Relative: 17 %
Lymphs Abs: 0.9 10*3/uL (ref 0.7–4.0)
MCH: 28.2 pg (ref 26.0–34.0)
MCHC: 33.5 g/dL (ref 30.0–36.0)
MCV: 84 fL (ref 80.0–100.0)
Monocytes Absolute: 0.3 10*3/uL (ref 0.1–1.0)
Monocytes Relative: 5 %
Neutro Abs: 4 10*3/uL (ref 1.7–7.7)
Neutrophils Relative %: 72 %
Platelets: 421 10*3/uL — ABNORMAL HIGH (ref 150–400)
RBC: 3.94 MIL/uL (ref 3.87–5.11)
RDW: 14.1 % (ref 11.5–15.5)
WBC: 5.5 10*3/uL (ref 4.0–10.5)
nRBC: 0 % (ref 0.0–0.2)

## 2020-07-25 LAB — FOLATE: Folate: 3.9 ng/mL — ABNORMAL LOW (ref >5.9)

## 2020-07-25 LAB — LACTATE DEHYDROGENASE: LDH: 193 U/L — ABNORMAL HIGH (ref 98–192)

## 2020-07-25 LAB — VITAMIN B12: Vitamin B-12: 388 pg/mL (ref 180–914)

## 2020-07-25 LAB — COMPREHENSIVE METABOLIC PANEL
ALT: 10 U/L (ref 0–44)
AST: 10 U/L — ABNORMAL LOW (ref 15–41)
Albumin: 3.9 g/dL (ref 3.5–5.0)
Alkaline Phosphatase: 131 U/L — ABNORMAL HIGH (ref 38–126)
Anion gap: 16 — ABNORMAL HIGH (ref 5–15)
BUN: 59 mg/dL — ABNORMAL HIGH (ref 6–20)
CO2: 23 mmol/L (ref 22–32)
Calcium: 8.4 mg/dL — ABNORMAL LOW (ref 8.9–10.3)
Chloride: 92 mmol/L — ABNORMAL LOW (ref 98–111)
Creatinine, Ser: 13.63 mg/dL — ABNORMAL HIGH (ref 0.44–1.00)
GFR, Estimated: 3 mL/min — ABNORMAL LOW (ref >60)
Glucose, Bld: 283 mg/dL — ABNORMAL HIGH (ref 70–99)
Potassium: 5.2 mmol/L — ABNORMAL HIGH (ref 3.5–5.1)
Sodium: 131 mmol/L — ABNORMAL LOW (ref 135–145)
Total Bilirubin: 0.5 mg/dL (ref 0.3–1.2)
Total Protein: 7.6 g/dL (ref 6.5–8.1)

## 2020-07-25 LAB — FERRITIN: Ferritin: 823 ng/mL — ABNORMAL HIGH (ref 11–307)

## 2020-07-25 MED ORDER — SODIUM CHLORIDE 0.9 % IV SOLN
3300.0000 mg | Freq: Once | INTRAVENOUS | Status: AC
  Administered 2020-07-25: 3300 mg via INTRAVENOUS
  Filled 2020-07-25: qty 33

## 2020-07-25 NOTE — Progress Notes (Signed)
[facility] NOTE  Patient Care Team: Wandra Feinstein, MD as PCP - General (Family Medicine) Rubie Maid, MD as Obstetrician (Obstetrics and Gynecology) [name], FNP as Consulting Physician (Family Medicine) Azzie Glatter, MD as Consulting Physician (Internal Medicine) Sherryll Burger, MD as Consulting Physician (Nephrology) Cammie Sickle, MD as Consulting Physician (Internal Medicine)  CHIEF COMPLAINTS/PURPOSE OF CONSULTATION: Atypical HUS  # ATYPICAL HEMOLYTIC UREMIC SYNDROME [April 2019-Dx; [facility]; Dr.Arepally;s/p Plex x1; April 26th- Ecluzimab 1200 mg q 2W; march 25th 2020- Ultomiris q  8 weeks.   # AKI/CKD [name] 2018;FEB 2020- hemodialysis [[facility] in [place]; Tuesday Thursday Saturday]; May 2020-peritoneal dialysis  # IDDM [since age of 73- Dr.Kerr,/Endo]; SEP 2021- COVID s/p monoclonal antibody therapy.   # WORK UP for HUS [[facility]] Phospholipase A2 Receptor AB/ S Phospholipase A2 Receptor IFA-NEGATIVE   Oncology History   No history exists.   HISTORY OF PRESENTING ILLNESS:  Kelly Huff 35 y.o.  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  Interim patient has moved to [place] area.  She had a recent dog to [facility] hematology for consideration of infusions to be done at [facility] moving forward.  Patient denies any acute events.  No hospitalizations.  Continues to have peritoneal dialysis.  Review of Systems  Constitutional: Positive for malaise/fatigue. Negative for chills, diaphoresis, fever and weight loss.  HENT: Negative for nosebleeds and sore throat.   Eyes: Negative for double vision.  Respiratory: Negative for cough, hemoptysis, sputum production, shortness of breath and wheezing.   Cardiovascular: Negative for chest pain, palpitations, orthopnea and leg swelling.  Gastrointestinal: Negative for abdominal pain, blood in  stool, constipation, diarrhea, heartburn, melena, nausea and vomiting.  Genitourinary: Negative for dysuria, frequency and urgency.  Musculoskeletal: Negative for back pain and joint pain.  Skin: Negative.  Negative for itching and rash.  Neurological: Negative for dizziness, tingling, focal weakness, weakness and headaches.  Endo/Heme/Allergies: Does not bruise/bleed easily.  Psychiatric/Behavioral: Negative for depression. The patient is not nervous/anxious and does not have insomnia.     MEDICAL HISTORY:  Past Medical History:  Diagnosis Date  . Chronic kidney disease   . Diabetes mellitus without complication ([place])   . History of pre-eclampsia 2016   mild  . Hypertension     SURGICAL HISTORY: Past Surgical History:  Procedure Laterality Date  . CESAREAN SECTION    . DILATION AND CURETTAGE OF UTERUS     x 4  . IR FLUORO GUIDE CV LINE RIGHT  12/05/2018  . IR FLUORO GUIDE CV LINE RIGHT  12/13/2018  . IR REMOVAL TUN CV CATH W/O FL  02/22/2019  . IR US GUIDE VASC ACCESS RIGHT  12/05/2018  . IR US GUIDE VASC ACCESS RIGHT  12/13/2018  . LAPAROSCOPIC APPENDECTOMY N/A 12/04/2019   Procedure: APPENDECTOMY LAPAROSCOPIC;  Surgeon: Donnie Mesa, MD;  Location: [facility];  Service: General;  Laterality: N/A;    SOCIAL HISTORY: Social History   Socioeconomic History  . Marital status: Married    Spouse name: Not on file  . Number of children: Not on file  . Years of education: Not on file  . Highest education level: Not on file  Occupational History  . Not on file  Tobacco Use  . Smoking status: Never Smoker  . Smokeless tobacco: Never Used  Vaping Use  . Vaping Use: Never used  Substance and Sexual Activity  . Alcohol use: No  .  Drug use: No  . Sexual activity: Yes    Birth control/protection: None, Surgical    Comment: tubial  Other Topics Concern  . Not on file  Social History Narrative  . Not on file   Social Determinants of Health   Financial Resource Strain: Not on  file  Food Insecurity: Not on file  Transportation Needs: Not on file  Physical Activity: Not on file  Stress: Not on file  Social Connections: Not on file  Intimate Partner Violence: Not on file    FAMILY HISTORY: Family History  Problem Relation Age of Onset  . Hypertension Mother   . Cancer Mother        Uterine vs cervical (pt unsure),   . Schizophrenia Brother   . Breast cancer Neg Hx     ALLERGIES:  is allergic to morphine, sulfamethoxazole-trimethoprim, covid-19 (mrna) vaccine, other, and trulicity [dulaglutide].  MEDICATIONS:  Current Outpatient Medications  Medication Sig Dispense Refill  . acetaminophen (TYLENOL) 325 MG tablet Take 2 tablets (650 mg total) by mouth every 4 (four) hours as needed for mild pain or fever.    Marland Kitchen amLODipine (NORVASC) 10 MG tablet Take 10 mg by mouth every evening.     . calcitRIOL (ROCALTROL) 0.25 MCG capsule Take 0.25 mcg by mouth daily.    . calcium acetate (PHOSLO) 667 MG tablet Take 2,001 mg by mouth 3 (three) times daily with meals.     . cinacalcet (SENSIPAR) 30 MG tablet Take 30 mg by mouth daily.    . cloNIDine (CATAPRES - DOSED IN MG/24 HR) 0.1 mg/24hr patch Place 0.1 mg onto the skin See admin instructions. Apply patch on every Sunday    . cyclobenzaprine (FLEXERIL) 10 MG tablet Take 10 mg by mouth at bedtime as needed for muscle spasms.     Marland Kitchen gentamicin cream (GARAMYCIN) 0.1 % Apply 1 application topically See admin instructions. Apply small amount to catheter exit site once daily    . insulin glargine (LANTUS SOLOSTAR) 100 UNIT/ML Solostar Pen     . insulin lispro (HUMALOG) 100 UNIT/ML injection Continue use of insulin pump with setting as previous: Restart in the morning of 03/23/2020 as Lantus was given on the morning of 03/22/2020. 10 mL 11  . metoprolol succinate (TOPROL-XL) 100 MG 24 hr tablet Take 100 mg by mouth 2 (two) times daily.    . sodium bicarbonate 650 MG tablet Take 1 tablet (650 mg total) by mouth 3 (three) times  daily. 120 tablet 0  . Insulin Disposable Pump (OMNIPOD DASH 5 PACK PODS) MISC CHANGE POD EVERY 3 DAYS 90 DAYS    . losartan (COZAAR) 50 MG tablet Take 50 mg by mouth daily. (Patient not taking: Reported on 07/25/2020)     No current facility-administered medications for this visit.   Facility-Administered Medications Ordered in Other Visits  Medication Dose Route Frequency Provider Last Rate Last Admin  . ravulizumab-cwvz (ULTOMIRIS) 3,300 mg in sodium chloride 0.9 % 66 mL (50 mg/mL) maintenance dose infusion  3,300 mg Intravenous Once Charlaine Dalton R, MD      . sodium chloride flush (NS) 0.9 % injection 10 mL  10 mL Intravenous PRN Cammie Sickle, MD       PHYSICAL EXAMINATION: ECOG PERFORMANCE STATUS: 1 - Symptomatic but completely ambulatory  Vitals:   07/25/20 1120  BP: (!) 142/90  Pulse: 76  Resp: 18  Temp: (!) 96.6 F (35.9 C)  SpO2: 100%   Filed Weights   07/25/20 1120  Weight:  184 lb (83.5 kg)    Physical Exam Constitutional:      Comments: Patient is alone.  HENT:     Head: Normocephalic and atraumatic.     Mouth/Throat:     Pharynx: No oropharyngeal exudate.  Eyes:     Pupils: Pupils are equal, round, and reactive to light.  Cardiovascular:     Rate and Rhythm: Normal rate and regular rhythm.  Pulmonary:     Effort: No respiratory distress.     Breath sounds: No wheezing.  Abdominal:     General: Bowel sounds are normal. There is no distension.     Palpations: Abdomen is soft. There is no mass.     Tenderness: There is no abdominal tenderness. There is no guarding or rebound.  Musculoskeletal:        General: No tenderness. Normal range of motion.     Cervical back: Normal range of motion and neck supple.  Skin:    General: Skin is warm.  Neurological:     Mental Status: She is alert and oriented to person, place, and time.  Psychiatric:        Mood and Affect: Affect normal.   ;  LABORATORY DATA:  I have reviewed the data as  listed Lab Results  Component Value Date   WBC 5.5 07/25/2020   HGB 11.1 (L) 07/25/2020   HCT 33.1 (L) 07/25/2020   MCV 84.0 07/25/2020   PLT 421 (H) 07/25/2020   Recent Labs    01/19/20 0954 01/24/20 0927 03/20/20 0113 03/20/20 1613 03/20/20 1749 04/02/20 0848 05/21/20 0858 07/25/20 1049  NA 131* 133*   < > QUESTIONABLE RESULTS, RECOMMEND RECOLLECT TO VERIFY   < > 131* 131* 131*  K 4.3 3.8   < > QUESTIONABLE RESULTS, RECOMMEND RECOLLECT TO VERIFY   < > 5.1 5.4* 5.2*  CL 90* 93*   < > QUESTIONABLE RESULTS, RECOMMEND RECOLLECT TO VERIFY   < > 92* 95* 92*  CO2 23 24   < > QUESTIONABLE RESULTS, RECOMMEND RECOLLECT TO VERIFY   < > '22 23 23  '$ GLUCOSE 377* 259*   < > QUESTIONABLE RESULTS, RECOMMEND RECOLLECT TO VERIFY   < > 442* 199* 283*  BUN 77* 63*   < > QUESTIONABLE RESULTS, RECOMMEND RECOLLECT TO VERIFY   < > 62* 74* 59*  CREATININE 18.91* 17.75*   < > QUESTIONABLE RESULTS, RECOMMEND RECOLLECT TO VERIFY   < > 13.32* 14.17* 13.63*  CALCIUM 8.3* 7.8*   < > QUESTIONABLE RESULTS, RECOMMEND RECOLLECT TO VERIFY   < > 7.6* 8.4* 8.4*  GFRNONAA 2* 2*   < > QUESTIONABLE RESULTS, RECOMMEND RECOLLECT TO VERIFY   < > 3* 3* 3*  GFRAA 2* 3*  --  QUESTIONABLE RESULTS, RECOMMEND RECOLLECT TO VERIFY  --   --   --   --   PROT 7.0 6.3*   < >  --    < > 6.7 6.6 7.6  ALBUMIN 3.4* 3.2*   < >  --    < > 3.3* 3.4* 3.9  AST 12* 11*   < >  --    < > 12* 9* 10*  ALT 8 9   < >  --    < > '11 8 10  '$ ALKPHOS 92 81   < >  --    < > 103 118 131*  BILITOT 0.6 0.6   < >  --    < > 0.3 0.5 0.5   < > =  values in this interval not displayed.    RADIOGRAPHIC STUDIES: I have personally reviewed the radiological images as listed and agreed with the findings in the report. No results found.  ASSESSMENT & PLAN:   HUS (hemolytic uremic syndrome), atypical ([place]) # Atypical hemolytic uremic syndrome -on Ultimoris infusions.  Stable [see below]  Platelets/LDH are normal.  Continue Ultimoris infusions every 8 weeks;  continue indefinitely at this time.  # Anemia- multifactorial- 11.1. [do not suspect secondary to atypical HUS]; likely secondary to ESRD  on procrit/Iron; JAN 2022-iron saturation 14%; ferritin 900+.  Today-awaiting iron studies/ferritin; haptoglobin today.  # ESRD- on PD- STABLE [awaiting transplant list].   # Hypertension-142/90- STABLE; [amlodipine; on metoprolol]  # Poorly controlled DM-199; STABLE; on insulin pump;[Dr.Kerr].   #Patient has moved to [place] area; she will follow up with [facility] hematology.  Previously evaluated; patient has been in touch with [facility] hematology office.  Patient will get infusions moving forward acute.  # DISPOSITION: # infusion treatment.   # follow up as needed-Dr.B   All questions were answered. The patient knows to call the clinic with any problems, questions or concerns.    Cammie Sickle, MD 07/25/2020 11:34 AM

## 2020-07-25 NOTE — Progress Notes (Signed)
Pt in for follow up, states recently moved to [place].

## 2020-07-25 NOTE — Progress Notes (Signed)
Patient has tolerated infusion well many times. Well tolerated today. OK to discharge home without 1 hour observation period per Dr. Tish Men.

## 2020-07-25 NOTE — Assessment & Plan Note (Signed)
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  Stable [see below]  Platelets/LDH are normal.  Continue Ultimoris infusions every 8 weeks; continue indefinitely at this time.  # Anemia- multifactorial- 11.1. [do not suspect secondary to atypical HUS]; likely secondary to ESRD  on procrit/Iron; JAN 2022-iron saturation 14%; ferritin 900+.  Today-awaiting iron studies/ferritin; haptoglobin today.  # ESRD- on PD- STABLE [awaiting transplant list].   # Hypertension-142/90- STABLE; [amlodipine; on metoprolol]  # Poorly controlled DM-199; STABLE; on insulin pump;[Dr.Kerr].   #Patient has moved to [place] area; she will follow up with [facility] hematology.  Previously evaluated; patient has been in touch with [place] hematology office.  Patient will get infusions moving forward acute.  # DISPOSITION: # infusion treatment.   # follow up as needed-Dr.B

## 2020-07-26 LAB — PATHOLOGIST SMEAR REVIEW

## 2020-07-26 LAB — HAPTOGLOBIN: Haptoglobin: 174 mg/dL (ref 33–278)

## 2020-09-03 ENCOUNTER — Telehealth: Payer: Self-pay | Admitting: Internal Medicine

## 2020-09-03 NOTE — Telephone Encounter (Signed)
Patient left message requesting to schedule appointment with Dr. Rogue Bussing and for infusions as she has not been able to get in with the other hematologist.  Routing to team for follow up.

## 2020-09-04 ENCOUNTER — Encounter: Payer: Self-pay | Admitting: *Deleted

## 2020-09-04 NOTE — Telephone Encounter (Signed)
Per Dr. Jacinto Reap - she is not due for her treatment until June. Ok to set up in June if patient doesn't have her apts at that time.

## 2020-09-04 NOTE — Telephone Encounter (Signed)
My chart msg sent to patient. 

## 2020-09-04 NOTE — Telephone Encounter (Signed)
Unable to reach patient. Vm box is not set up.

## 2020-09-05 NOTE — Telephone Encounter (Signed)
Kelly Huff - I have attempted to reach patient. Voice mail box not set up. Patient has not returned RN's phone call. She has not viewed the mychart msg that I sent yesterday either.

## 2020-09-06 NOTE — Telephone Encounter (Signed)
Pt was able to get an apt with [facility]

## 2021-01-11 DIAGNOSIS — Z862 Personal history of diseases of the blood and blood-forming organs and certain disorders involving the immune mechanism: Secondary | ICD-10-CM | POA: Insufficient documentation

## 2021-03-11 DIAGNOSIS — R4182 Altered mental status, unspecified: Secondary | ICD-10-CM | POA: Insufficient documentation

## 2021-03-11 DIAGNOSIS — R519 Headache, unspecified: Secondary | ICD-10-CM | POA: Insufficient documentation

## 2021-05-08 DIAGNOSIS — T782XXA Anaphylactic shock, unspecified, initial encounter: Secondary | ICD-10-CM | POA: Insufficient documentation

## 2021-05-08 DIAGNOSIS — T7840XA Allergy, unspecified, initial encounter: Secondary | ICD-10-CM | POA: Insufficient documentation

## 2021-05-08 DIAGNOSIS — E785 Hyperlipidemia, unspecified: Secondary | ICD-10-CM | POA: Insufficient documentation

## 2021-05-23 DIAGNOSIS — E8809 Other disorders of plasma-protein metabolism, not elsewhere classified: Secondary | ICD-10-CM | POA: Insufficient documentation

## 2022-02-09 DIAGNOSIS — E538 Deficiency of other specified B group vitamins: Secondary | ICD-10-CM | POA: Insufficient documentation

## 2022-02-15 DIAGNOSIS — E538 Deficiency of other specified B group vitamins: Secondary | ICD-10-CM | POA: Insufficient documentation

## 2022-02-26 DIAGNOSIS — B009 Herpesviral infection, unspecified: Secondary | ICD-10-CM

## 2022-02-26 HISTORY — DX: Herpesviral infection, unspecified: B00.9

## 2022-03-05 DIAGNOSIS — E1143 Type 2 diabetes mellitus with diabetic autonomic (poly)neuropathy: Secondary | ICD-10-CM | POA: Insufficient documentation

## 2022-03-05 DIAGNOSIS — I152 Hypertension secondary to endocrine disorders: Secondary | ICD-10-CM | POA: Insufficient documentation

## 2022-03-16 DIAGNOSIS — Z8673 Personal history of transient ischemic attack (TIA), and cerebral infarction without residual deficits: Secondary | ICD-10-CM | POA: Insufficient documentation

## 2022-07-23 DIAGNOSIS — K52838 Other microscopic colitis: Secondary | ICD-10-CM | POA: Insufficient documentation

## 2022-07-28 DIAGNOSIS — H35413 Lattice degeneration of retina, bilateral: Secondary | ICD-10-CM | POA: Insufficient documentation

## 2022-09-15 DIAGNOSIS — Z4932 Encounter for adequacy testing for peritoneal dialysis: Secondary | ICD-10-CM | POA: Insufficient documentation

## 2022-10-09 DIAGNOSIS — N912 Amenorrhea, unspecified: Secondary | ICD-10-CM | POA: Insufficient documentation

## 2022-11-01 ENCOUNTER — Other Ambulatory Visit: Payer: Self-pay

## 2022-11-01 ENCOUNTER — Emergency Department (HOSPITAL_COMMUNITY): Payer: 59

## 2022-11-01 ENCOUNTER — Observation Stay (HOSPITAL_COMMUNITY)
Admission: EM | Admit: 2022-11-01 | Discharge: 2022-11-03 | Disposition: A | Discharge status: Still In Hospital | Payer: 59 | Attending: Internal Medicine | Admitting: Internal Medicine

## 2022-11-01 ENCOUNTER — Encounter (HOSPITAL_COMMUNITY): Payer: Self-pay

## 2022-11-01 DIAGNOSIS — Z794 Long term (current) use of insulin: Secondary | ICD-10-CM | POA: Diagnosis not present

## 2022-11-01 DIAGNOSIS — K652 Spontaneous bacterial peritonitis: Secondary | ICD-10-CM | POA: Insufficient documentation

## 2022-11-01 DIAGNOSIS — I120 Hypertensive chronic kidney disease with stage 5 chronic kidney disease or end stage renal disease: Secondary | ICD-10-CM | POA: Diagnosis not present

## 2022-11-01 DIAGNOSIS — R109 Unspecified abdominal pain: Secondary | ICD-10-CM | POA: Diagnosis not present

## 2022-11-01 DIAGNOSIS — E1159 Type 2 diabetes mellitus with other circulatory complications: Secondary | ICD-10-CM | POA: Diagnosis present

## 2022-11-01 DIAGNOSIS — N186 End stage renal disease: Secondary | ICD-10-CM

## 2022-11-01 DIAGNOSIS — Z79899 Other long term (current) drug therapy: Secondary | ICD-10-CM | POA: Diagnosis not present

## 2022-11-01 DIAGNOSIS — I152 Hypertension secondary to endocrine disorders: Secondary | ICD-10-CM | POA: Diagnosis present

## 2022-11-01 DIAGNOSIS — E103219 Type 1 diabetes mellitus with mild nonproliferative diabetic retinopathy with macular edema, unspecified eye: Secondary | ICD-10-CM | POA: Diagnosis not present

## 2022-11-01 DIAGNOSIS — Z992 Dependence on renal dialysis: Secondary | ICD-10-CM | POA: Diagnosis not present

## 2022-11-01 DIAGNOSIS — E1022 Type 1 diabetes mellitus with diabetic chronic kidney disease: Secondary | ICD-10-CM | POA: Insufficient documentation

## 2022-11-01 LAB — CBC WITH DIFFERENTIAL/PLATELET
Abs Immature Granulocytes: 0.03 10*3/uL (ref 0.00–0.07)
Basophils Absolute: 0 10*3/uL (ref 0.0–0.1)
Basophils Relative: 0 %
Eosinophils Absolute: 0.2 10*3/uL (ref 0.0–0.5)
Eosinophils Relative: 2 %
HCT: 35.1 % — ABNORMAL LOW (ref 36.0–46.0)
Hemoglobin: 11.4 g/dL — ABNORMAL LOW (ref 12.0–15.0)
Immature Granulocytes: 0 %
Lymphocytes Relative: 13 %
Lymphs Abs: 1.1 10*3/uL (ref 0.7–4.0)
MCH: 29.5 pg (ref 26.0–34.0)
MCHC: 32.5 g/dL (ref 30.0–36.0)
MCV: 90.9 fL (ref 80.0–100.0)
Monocytes Absolute: 0.5 10*3/uL (ref 0.1–1.0)
Monocytes Relative: 6 %
Neutro Abs: 6.7 10*3/uL (ref 1.7–7.7)
Neutrophils Relative %: 79 %
Platelets: 328 10*3/uL (ref 150–400)
RBC: 3.86 MIL/uL — ABNORMAL LOW (ref 3.87–5.11)
RDW: 14 % (ref 11.5–15.5)
WBC: 8.5 10*3/uL (ref 4.0–10.5)
nRBC: 0 % (ref 0.0–0.2)

## 2022-11-01 LAB — COMPREHENSIVE METABOLIC PANEL
ALT: 16 U/L (ref 0–44)
AST: 16 U/L (ref 15–41)
Albumin: 3.1 g/dL — ABNORMAL LOW (ref 3.5–5.0)
Alkaline Phosphatase: 157 U/L — ABNORMAL HIGH (ref 38–126)
Anion gap: 19 — ABNORMAL HIGH (ref 5–15)
BUN: 42 mg/dL — ABNORMAL HIGH (ref 6–20)
CO2: 26 mmol/L (ref 22–32)
Calcium: 9.1 mg/dL (ref 8.9–10.3)
Chloride: 94 mmol/L — ABNORMAL LOW (ref 98–111)
Creatinine, Ser: 10.96 mg/dL — ABNORMAL HIGH (ref 0.44–1.00)
GFR, Estimated: 4 mL/min — ABNORMAL LOW (ref >60)
Glucose, Bld: 115 mg/dL — ABNORMAL HIGH (ref 70–99)
Potassium: 3.8 mmol/L (ref 3.5–5.1)
Sodium: 139 mmol/L (ref 135–145)
Total Bilirubin: 0.8 mg/dL (ref 0.3–1.2)
Total Protein: 6.7 g/dL (ref 6.5–8.1)

## 2022-11-01 LAB — BODY FLUID CELL COUNT WITH DIFFERENTIAL
Eos, Fluid: 0 %
Lymphs, Fluid: 2 %
Monocyte-Macrophage-Serous Fluid: 10 % — ABNORMAL LOW (ref 50–90)
Neutrophil Count, Fluid: 88 % — ABNORMAL HIGH (ref 0–25)
Total Nucleated Cell Count, Fluid: 5329 cu mm — ABNORMAL HIGH (ref 0–1000)

## 2022-11-01 LAB — LIPASE, BLOOD: Lipase: 31 U/L (ref 11–51)

## 2022-11-01 LAB — HCG, QUANTITATIVE, PREGNANCY: hCG, Beta Chain, Quant, S: 2 m[IU]/mL (ref <5)

## 2022-11-01 LAB — BODY FLUID CULTURE W GRAM STAIN

## 2022-11-01 MED ORDER — ACETAMINOPHEN 650 MG RE SUPP: 650.0000 mg | Freq: Four times a day (QID) | RECTAL | Status: DC | PRN

## 2022-11-01 MED ORDER — DIPHENHYDRAMINE HCL 50 MG/ML IJ SOLN
25.0000 mg | Freq: Once | INTRAMUSCULAR | Status: AC
  Administered 2022-11-01: 25 mg via INTRAVENOUS
  Filled 2022-11-01: qty 1

## 2022-11-01 MED ORDER — ONDANSETRON HCL 4 MG/2ML IJ SOLN
4.0000 mg | Freq: Once | INTRAMUSCULAR | Status: AC
  Administered 2022-11-01: 4 mg via INTRAVENOUS
  Filled 2022-11-01: qty 2

## 2022-11-01 MED ORDER — SODIUM CHLORIDE 0.9% FLUSH
3.0000 mL | Freq: Two times a day (BID) | INTRAVENOUS | Status: DC
  Administered 2022-11-01 – 2022-11-03 (×4): 3 mL via INTRAVENOUS

## 2022-11-01 MED ORDER — POLYETHYLENE GLYCOL 3350 17 G PO PACK: 17.0000 g | PACK | Freq: Every day | ORAL | Status: DC | PRN

## 2022-11-01 MED ORDER — FENTANYL CITRATE PF 50 MCG/ML IJ SOSY
25.0000 ug | PREFILLED_SYRINGE | Freq: Once | INTRAMUSCULAR | Status: AC
  Administered 2022-11-01: 25 ug via INTRAVENOUS
  Filled 2022-11-01: qty 1

## 2022-11-01 MED ORDER — FENTANYL CITRATE PF 50 MCG/ML IJ SOSY
50.0000 ug | PREFILLED_SYRINGE | Freq: Once | INTRAMUSCULAR | Status: AC
  Administered 2022-11-01: 50 ug via INTRAVENOUS
  Filled 2022-11-01: qty 1

## 2022-11-01 MED ORDER — SODIUM CHLORIDE 0.9 % IV SOLN
2.0000 g | INTRAVENOUS | Status: DC
  Administered 2022-11-01 – 2022-11-03 (×3): 2 g via INTRAVENOUS
  Filled 2022-11-01 (×3): qty 20

## 2022-11-01 MED ORDER — HEPARIN SODIUM (PORCINE) 1000 UNIT/ML IJ SOLN: INTRAPERITONEAL | Status: DC | PRN

## 2022-11-01 MED ORDER — TRAZODONE HCL 50 MG PO TABS
50.0000 mg | ORAL_TABLET | Freq: Every evening | ORAL | Status: DC | PRN
  Administered 2022-11-01 – 2022-11-02 (×2): 50 mg via ORAL
  Filled 2022-11-01 (×2): qty 1

## 2022-11-01 MED ORDER — CYCLOBENZAPRINE HCL 10 MG PO TABS
10.0000 mg | ORAL_TABLET | Freq: Every evening | ORAL | Status: DC | PRN
  Administered 2022-11-01 – 2022-11-02 (×2): 10 mg via ORAL
  Filled 2022-11-01 (×3): qty 1

## 2022-11-01 MED ORDER — METOPROLOL SUCCINATE ER 50 MG PO TB24
100.0000 mg | ORAL_TABLET | Freq: Two times a day (BID) | ORAL | Status: DC
  Administered 2022-11-01: 50 mg via ORAL
  Administered 2022-11-02 – 2022-11-03 (×3): 100 mg via ORAL
  Filled 2022-11-01 (×4): qty 2

## 2022-11-01 MED ORDER — HYDROMORPHONE HCL 1 MG/ML IJ SOLN
1.0000 mg | Freq: Once | INTRAMUSCULAR | Status: AC
  Administered 2022-11-01: 1 mg via INTRAVENOUS
  Filled 2022-11-01: qty 1

## 2022-11-01 MED ORDER — DELFLEX-LC/1.5% DEXTROSE 344 MOSM/L IP SOLN
Freq: Every day | INTRAPERITONEAL | Status: DC
Start: 2022-11-01 — End: 2022-11-01

## 2022-11-01 MED ORDER — INSULIN PUMP
Freq: Three times a day (TID) | SUBCUTANEOUS | Status: DC
  Filled 2022-11-01: qty 1

## 2022-11-01 MED ORDER — ACETAMINOPHEN 325 MG PO TABS
650.0000 mg | ORAL_TABLET | Freq: Four times a day (QID) | ORAL | Status: DC | PRN
  Administered 2022-11-01: 650 mg via ORAL
  Filled 2022-11-01: qty 2

## 2022-11-01 MED ORDER — DELFLEX-LC/1.5% DEXTROSE 344 MOSM/L IP SOLN: INTRAPERITONEAL | Status: DC

## 2022-11-01 MED ORDER — HEPARIN SODIUM (PORCINE) 5000 UNIT/ML IJ SOLN
5000.0000 [IU] | Freq: Three times a day (TID) | INTRAMUSCULAR | Status: DC
  Administered 2022-11-01 – 2022-11-03 (×6): 5000 [IU] via SUBCUTANEOUS
  Filled 2022-11-01 (×6): qty 1

## 2022-11-01 MED ORDER — FENTANYL CITRATE PF 50 MCG/ML IJ SOSY
25.0000 ug | PREFILLED_SYRINGE | INTRAMUSCULAR | Status: DC | PRN
  Administered 2022-11-01 – 2022-11-03 (×8): 25 ug via INTRAVENOUS
  Filled 2022-11-01 (×8): qty 1

## 2022-11-01 MED ORDER — HYDROMORPHONE HCL 1 MG/ML IJ SOLN: 1.0000 mg | Freq: Once | INTRAMUSCULAR | Status: DC

## 2022-11-01 MED ORDER — AMLODIPINE BESYLATE 10 MG PO TABS
10.0000 mg | ORAL_TABLET | Freq: Every evening | ORAL | Status: DC
  Administered 2022-11-01 – 2022-11-02 (×2): 10 mg via ORAL
  Filled 2022-11-01: qty 1
  Filled 2022-11-01: qty 2

## 2022-11-01 MED ORDER — GENTAMICIN SULFATE 0.1 % EX CREA
1.0000 | TOPICAL_CREAM | Freq: Every day | CUTANEOUS | Status: DC
  Administered 2022-11-02 – 2022-11-03 (×2): 1 via TOPICAL
  Filled 2022-11-01: qty 15

## 2022-11-01 NOTE — Progress Notes (Signed)
[facility] Brief Note  KATALINA PETTIBONE is a 37 year old woman with ESRD on CCPD since 2021. Also with T1DM, HTN.Presented to the ED with acute abdominal pain started during her PD treatment last night. Reports sudden sharp stabbing pain doing second drain of the night. She tried to bypass and drain again, drained 2L but pain was so severe she came to the ED.   CT Ab negative for acute findings  Dialysis RN was able to collect fluid sample when she arrived-- Fluid cloudy with TNCC 5329, [place] 88 suggesting peritonitis. Pain meds/ IV Rocephin started in the ED and she is admitted observation status.    She just moved back to [place] from [place]. Has established with HT unit on [facility]. She was in her usual state of health until last night. Denies fevers, chills, nausea/vomiting. Some pain relief this afternoon, but abdomen remains very tender.   PD Rx per patient: 4 exchanges + 1 day exchange;  2L fill volume, 2hr dwell time. Last fill 1.2L.  Will plan for 5 exchanges, no pause in the hospital   Plan -Continue antibiotics per primary -Await fluid culture results -Will continue CCPD overnight.    Tomasa Blase PA-C 11/01/2022 3:40 PM

## 2022-11-01 NOTE — ED Notes (Signed)
Patient transported to CT 

## 2022-11-01 NOTE — ED Triage Notes (Signed)
Pt BIBGEMS from home with sudden stabbing and cramping pain onset central abdominal pain that radiates to the back. Pt has a peritoneal dialysis catheter on the right side. Pt called nurse and was advised to drain abdomen, she drained one full container 2,000 mL. Pain does not offer any relief no matter the interventions. Peritoneal dialysis site is WDL.   128/88 106 hr 16 rr 99 % ra 124 CBG

## 2022-11-01 NOTE — ED Notes (Signed)
Dialysis at bedside

## 2022-11-01 NOTE — ED Notes (Signed)
ED TO INPATIENT HANDOFF REPORT  ED Nurse Name and Phone #: [phone]  S Name/Age/Gender Kelly Huff 37 y.o. female Room/Bed: 045C/045C  Code Status   Code Status: Full Code  Home/SNF/Other Home Patient oriented to: self, place, time, and situation Is this baseline? Yes   Triage Complete: Triage complete  Chief Complaint Intractable abdominal pain [R10.9]  Triage Note Pt BIBGEMS from home with sudden stabbing and cramping pain onset central abdominal pain that radiates to the back. Pt has a peritoneal dialysis catheter on the right side. Pt called nurse and was advised to drain abdomen, she drained one full container 2,000 mL. Pain does not offer any relief no matter the interventions. Peritoneal dialysis site is WDL.   128/88 106 hr 16 rr 99 % ra 124 CBG     Allergies Allergies  Allergen Reactions   Morphine Itching and Other (See Comments)    Hallucination   Peanut Oil Swelling    Tongue swelling as a child per Care Everywhere   Sulfamethoxazole-Trimethoprim Hives and Other (See Comments)   Covid-19 (Mrna) Vaccine    Other     peanut oil caused tongue swelling / one childhood episode   Trulicity [Dulaglutide] Other (See Comments)    She is DM Type 1. Was prescribed but damaged her kidneys    Level of Care/Admitting Diagnosis ED Disposition     ED Disposition  Admit   Condition  --   Comment  Hospital [facility] [[id number]]  Level of Care: Telemetry Medical [104]  May place patient in observation at [facility] or [facility] if equivalent level of care is available:: No  Covid Evaluation: Asymptomatic - no recent exposure (last 10 days) testing not required  Diagnosis: Intractable abdominal pain [720109]  Admitting Physician: Synetta Fail [[id number]]  Attending Physician: Synetta Fail [[id number]]          B Medical/Surgery History Past Medical History:  Diagnosis Date   Acute encephalopathy 09/03/2018   Chronic  kidney disease    Diabetes mellitus without complication (HCC)    DKA (diabetic ketoacidosis) (HCC) 03/20/2020   History of pre-eclampsia 2016   mild   Hypertension    Hypertensive urgency 09/03/2018   Past Surgical History:  Procedure Laterality Date   CESAREAN SECTION     DILATION AND CURETTAGE OF UTERUS     x 4   IR FLUORO GUIDE CV LINE RIGHT  12/05/2018   IR FLUORO GUIDE CV LINE RIGHT  12/13/2018   IR REMOVAL TUN CV CATH W/O FL  02/22/2019   IR US GUIDE VASC ACCESS RIGHT  12/05/2018   IR US GUIDE VASC ACCESS RIGHT  12/13/2018   LAPAROSCOPIC APPENDECTOMY N/A 12/04/2019   Procedure: APPENDECTOMY LAPAROSCOPIC;  Surgeon: Manus Rudd, MD;  Location: [facility] OR;  Service: General;  Laterality: N/A;     A IV Location/Drains/Wounds Patient Lines/Drains/Airways Status     Active Line/Drains/Airways     Name Placement date Placement time Site Days   Peripheral IV 11/01/22 18 G Right Antecubital 11/01/22  0827  Antecubital  less than 1   Incision (Closed) 12/04/19 Abdomen Other (Comment) 12/04/19  2213  -- 1063   Incision - 3 Ports Abdomen 1: Umbilicus 2: Medial;Upper 3: Left;Lateral;Lower 12/04/19  2145  -- 1063            Intake/Output Last 24 hours No intake or output data in the 24 hours ending 11/01/22 1634  Labs/Imaging Results for orders placed or performed during  the hospital encounter of 11/01/22 (from the past 48 hour(s))  CBC with Differential     Status: Abnormal   Collection Time: 11/01/22  8:04 AM  Result Value Ref Range   WBC 8.5 4.0 - 10.5 K/uL   RBC 3.86 (L) 3.87 - 5.11 MIL/uL   Hemoglobin 11.4 (L) 12.0 - 15.0 g/dL   HCT 21.3 (L) 08.6 - 57.8 %   MCV 90.9 80.0 - 100.0 fL   MCH 29.5 26.0 - 34.0 pg   MCHC 32.5 30.0 - 36.0 g/dL   RDW 46.9 62.9 - 52.8 %   Platelets 328 150 - 400 K/uL   nRBC 0.0 0.0 - 0.2 %   Neutrophils Relative % 79 %   Neutro Abs 6.7 1.7 - 7.7 K/uL   Lymphocytes Relative 13 %   Lymphs Abs 1.1 0.7 - 4.0 K/uL   Monocytes Relative 6 %    Monocytes Absolute 0.5 0.1 - 1.0 K/uL   Eosinophils Relative 2 %   Eosinophils Absolute 0.2 0.0 - 0.5 K/uL   Basophils Relative 0 %   Basophils Absolute 0.0 0.0 - 0.1 K/uL   Immature Granulocytes 0 %   Abs Immature Granulocytes 0.03 0.00 - 0.07 K/uL    Comment: Performed at [facility], [address] [place], [place] [address]  Comprehensive metabolic panel     Status: Abnormal   Collection Time: 11/01/22  8:04 AM  Result Value Ref Range   Sodium 139 135 - 145 mmol/L   Potassium 3.8 3.5 - 5.1 mmol/L   Chloride 94 (L) 98 - 111 mmol/L   CO2 26 22 - 32 mmol/L   Glucose, Bld 115 (H) 70 - 99 mg/dL    Comment: Glucose reference range applies only to samples taken after fasting for at least 8 hours.   BUN 42 (H) 6 - 20 mg/dL   Creatinine, Ser 40.10 (H) 0.44 - 1.00 mg/dL   Calcium 9.1 8.9 - 27.2 mg/dL   Total Protein 6.7 6.5 - 8.1 g/dL   Albumin 3.1 (L) 3.5 - 5.0 g/dL   AST 16 15 - 41 U/L   ALT 16 0 - 44 U/L   Alkaline Phosphatase 157 (H) 38 - 126 U/L   Total Bilirubin 0.8 0.3 - 1.2 mg/dL   GFR, Estimated 4 (L) >60 mL/min    Comment: (NOTE) Calculated using the CKD-EPI Creatinine Equation (2021)    Anion gap 19 (H) 5 - 15    Comment: Performed at [facility], [address] [place], [place] [address]  Lipase, blood     Status: None   Collection Time: 11/01/22  8:04 AM  Result Value Ref Range   Lipase 31 11 - 51 U/L    Comment: Performed at [facility], [address], [place], [place] [address]  hCG, quantitative, pregnancy     Status: None   Collection Time: 11/01/22  8:04 AM  Result Value Ref Range   hCG, Beta Chain, Quant, S 2 <5 mIU/mL    Comment:          GEST. AGE      CONC.  (mIU/mL)   <=1 WEEK        5 - 50     2 WEEKS       50 - 500     3 WEEKS       100 - 10,000     4 WEEKS     1,000 - 30,000  5 WEEKS     3,500 - 115,000   6-8 WEEKS     12,000 - 270,000    12 WEEKS     15,000 - 220,000        FEMALE AND NON-PREGNANT FEMALE:     LESS THAN  5 mIU/mL Performed at [facility], [address], [place], [place] [address]   Body fluid cell count with differential     Status: Abnormal   Collection Time: 11/01/22 11:50 AM  Result Value Ref Range   Fluid Type-FCT Peritoneal FLUID NO CYTO     Comment: CORRECTED ON 07/07 AT 1337: PREVIOUSLY REPORTED AS Peritoneal   Color, Fluid COLORLESS (A) YELLOW   Appearance, Fluid CLOUDY (A) CLEAR   Total Nucleated Cell Count, Fluid 5,329 (H) 0 - 1,000 cu mm   Neutrophil Count, Fluid 88 (H) 0 - 25 %   Lymphs, Fluid 2 %   Monocyte-Macrophage-Serous Fluid 10 (L) 50 - 90 %   Eos, Fluid 0 %    Comment: Performed at [facility], [address], [place], [place] [address]   CT ABDOMEN PELVIS WO CONTRAST  Result Date: 11/01/2022 CLINICAL DATA:  Midline abdominal pain. History of peritoneal dialysis. EXAM: CT ABDOMEN AND PELVIS WITHOUT CONTRAST TECHNIQUE: Multidetector CT imaging of the abdomen and pelvis was performed following the standard protocol without IV contrast. RADIATION DOSE REDUCTION: This exam was performed according to the departmental dose-optimization program which includes automated exposure control, adjustment of the mA and/or kV according to patient size and/or use of iterative reconstruction technique. COMPARISON:  03/20/2020. FINDINGS: Lower chest: Mild dependent atelectasis at the right lung base. Lung bases otherwise clear. Hepatobiliary: No focal liver abnormality is seen. No gallstones, gallbladder wall thickening, or biliary dilatation. Pancreas: Unremarkable. No pancreatic ductal dilatation or surrounding inflammatory changes. Spleen: Normal in size without focal abnormality. Adrenals/Urinary Tract: Bilateral renal cortical thinning with decreased renal sizes. No renal mass. No collecting system stone. No hydronephrosis. No adrenal masses. Normal ureters. Bladder mostly decompressed, otherwise unremarkable. Stomach/Bowel: Stomach is unremarkable. Small bowel and colon are  normal in caliber. No wall thickening. No inflammation. Vascular/Lymphatic: Aorta normal in caliber with minimal atherosclerotic calcifications. More extensive calcification is noted along the aortic branch vessels, significantly increased when compared to the prior CT. No enlarged lymph nodes. Reproductive: 4.2 cm right adnexal/ovarian cystic mass measuring 4.2 cm. No follow-up recommended unless there are symptoms referable to the right adnexa. Other: Trace amount of ascites. Small amount of free intraperitoneal air. These findings are presumed due to the peritoneal dialysis catheter, which curls in the lower pelvis just above the bladder. Musculoskeletal: Normal. IMPRESSION: 1. No acute findings within the abdomen or pelvis. 2. Findings of chronic renal disease and peritoneal dialysis, including small kidneys with bilateral cortical thinning, peritoneal dialysis catheter in the pelvis, and a trace amount of ascites and a small amount of free intraperitoneal air. 3. Significant aortic branch vessel calcifications that have developed since the previous CT. Electronically Signed   By: Amie Portland M.D.   On: 11/01/2022 11:04    Pending Labs Unresulted Labs (From admission, onward)     Start     Ordered   11/02/22 0500  CBC  Tomorrow morning,   R        11/01/22 1412   11/02/22 0500  Renal function panel  Tomorrow morning,   R        11/01/22 1412   11/01/22 1400  HIV Antibody (routine testing  w rflx)  (HIV Antibody (Routine testing w reflex) panel)  Once,   R        11/01/22 1412   11/01/22 1150  Pathologist smear review  Once,   R        11/01/22 1150   11/01/22 0942  Body fluid culture w Gram Stain  Once,   URGENT       Question Answer Comment  Are there also cytology or pathology orders on this specimen? Yes   Patient immune status Immunocompromised      11/01/22 0942   11/01/22 0805  Pregnancy, urine  Once,   URGENT        11/01/22 0804   11/01/22 0804  Urinalysis, Routine w reflex  microscopic -Urine, Clean Catch  Once,   URGENT       Question:  Specimen Source  Answer:  Urine, Clean Catch   11/01/22 0803            Vitals/Pain Today's Vitals   11/01/22 1130 11/01/22 1247 11/01/22 1353 11/01/22 1354  BP: 126/80     Pulse: (!) 112     Resp: 20     Temp:    98 F (36.7 C)  TempSrc:    Oral  SpO2: 100%     Weight:      Height:      PainSc:  2  5      Isolation Precautions No active isolations  Medications Medications  cefTRIAXone (ROCEPHIN) 2 g in sodium chloride 0.9 % 100 mL IVPB (0 g Intravenous Stopped 11/01/22 1330)  heparin injection 5,000 Units (has no administration in time range)  sodium chloride flush (NS) 0.9 % injection 3 mL (3 mLs Intravenous Not Given 11/01/22 1452)  acetaminophen (TYLENOL) tablet 650 mg (has no administration in time range)    Or  acetaminophen (TYLENOL) suppository 650 mg (has no administration in time range)  polyethylene glycol (MIRALAX / GLYCOLAX) packet 17 g (has no administration in time range)  fentaNYL (SUBLIMAZE) injection 25 mcg (has no administration in time range)  insulin pump (has no administration in time range)  amLODipine (NORVASC) tablet 10 mg (has no administration in time range)  metoprolol succinate (TOPROL-XL) 24 hr tablet 100 mg (has no administration in time range)  gentamicin cream (GARAMYCIN) 0.1 % 1 Application (has no administration in time range)  dialysis solution 1.5% low-MG/low-CA dianeal solution (has no administration in time range)  heparin sodium (porcine) 3,000 Units in dialysis solution 1.5% low-MG/low-CA 6,000 mL dialysis solution (has no administration in time range)  HYDROmorphone (DILAUDID) injection 1 mg (1 mg Intravenous Given 11/01/22 0833)  ondansetron (ZOFRAN) injection 4 mg (4 mg Intravenous Given 11/01/22 0833)  diphenhydrAMINE (BENADRYL) injection 25 mg (25 mg Intravenous Given 11/01/22 0907)  fentaNYL (SUBLIMAZE) injection 50 mcg (50 mcg Intravenous Given 11/01/22 1003)  fentaNYL  (SUBLIMAZE) injection 50 mcg (50 mcg Intravenous Given 11/01/22 1134)  fentaNYL (SUBLIMAZE) injection 25 mcg (25 mcg Intravenous Given 11/01/22 1352)    Mobility walks     Focused Assessments Cardiac Assessment Handoff:  Cardiac Rhythm: Normal sinus rhythm Lab Results  Component Value Date   TROPONINI 0.12 (HH) 08/14/2017   No results found for: "DDIMER" Does the Patient currently have chest pain? No    R Recommendations: See Admitting Provider Note  Report given to:   Additional Notes:

## 2022-11-01 NOTE — H&P (Signed)
History and Physical   Kelly Huff ZOX:[id number] DOB: 1985/12/29 DOA: 11/01/2022  PCP: Alain Marion, MD   Patient coming from: Home  Chief Complaint: Abdominal pain  HPI: Kelly Huff is a 37 y.o. female with medical history significant of DM 1, ESRD on PD, TTP, HHS presenting with abdominal pain.  Patient was performing PD at home and had sudden onset abdominal pain described as sharp/stabbing sensation.  She tried Tylenol at home without relief.  She called her peritoneal dialysis nurse who recommended draining it to gravity and she drained about 2 L but pain persisted.  Due to this she presented to the ED for further evaluation.  She denies fevers, chills, chest pain, shortness of breath, constipation, diarrhea, nausea, vomiting.  ED Course: Vital signs in the ED notable for heart rate in the 90s to 110s, blood pressure in the 120s to 140 systolic, respiratory rate in the 20s.  Lab workup included CMP with chloride 94, BUN 42, creatinine stable at 10.96, glucose 115, gap 19, albumin 3.1, alk phos 157.  CBC showed hemoglobin stable at 11.4.  Lipase normal.  Urinalysis pending.  Body fluid cell count and culture from peritoneal fluid pending.  CT abdomen pelvis showed no acute abnormality and findings consistent with ESRD and PD.  Patient received fentanyl, Dilaudid in the ED with continued pain.  Also started on ceftriaxone for presumed SBP.  Receive Zofran and Benadryl as well.  Review of Systems: As per HPI otherwise all other systems reviewed and are negative.  Past Medical History:  Diagnosis Date   Acute encephalopathy 09/03/2018   Chronic kidney disease    Diabetes mellitus without complication (HCC)    DKA (diabetic ketoacidosis) (HCC) 03/20/2020   History of pre-eclampsia 2016   mild   Hypertension    Hypertensive urgency 09/03/2018    Past Surgical History:  Procedure Laterality Date   CESAREAN SECTION     DILATION AND CURETTAGE OF UTERUS     x 4   IR FLUORO  GUIDE CV LINE RIGHT  12/05/2018   IR FLUORO GUIDE CV LINE RIGHT  12/13/2018   IR REMOVAL TUN CV CATH W/O FL  02/22/2019   IR US GUIDE VASC ACCESS RIGHT  12/05/2018   IR US GUIDE VASC ACCESS RIGHT  12/13/2018   LAPAROSCOPIC APPENDECTOMY N/A 12/04/2019   Procedure: APPENDECTOMY LAPAROSCOPIC;  Surgeon: Manus Rudd, MD;  Location: [facility] OR;  Service: General;  Laterality: N/A;    Social History  reports that she has never smoked. She has never used smokeless tobacco. She reports that she does not drink alcohol and does not use drugs.  Allergies  Allergen Reactions   Morphine Itching and Other (See Comments)    Hallucination   Sulfamethoxazole-Trimethoprim Hives and Other (See Comments)   Covid-19 (Mrna) Vaccine    Other     peanut oil caused tongue swelling / one childhood episode   Trulicity [Dulaglutide] Other (See Comments)    She is DM Type 1. Was prescribed but damaged her kidneys    Family History  Problem Relation Age of Onset   Hypertension Mother    Cancer Mother        Uterine vs cervical (pt unsure),    Schizophrenia Brother    Breast cancer Neg Hx   Reviewed on admission  Prior to Admission medications   Medication Sig Start Date End Date Taking? Authorizing Provider  acetaminophen (TYLENOL) 325 MG tablet Take 2 tablets (650 mg total) by mouth every 4 (four)  hours as needed for mild pain or fever. 12/05/19   Meuth, Brooke A, PA-C  amLODipine (NORVASC) 10 MG tablet Take 10 mg by mouth every evening.  11/06/18   [provider]  calcitRIOL (ROCALTROL) 0.25 MCG capsule Take 0.25 mcg by mouth daily. 02/28/20   [provider]  calcium acetate (PHOSLO) 667 MG tablet Take 2,001 mg by mouth 3 (three) times daily with meals.  11/05/18   [provider]  cinacalcet (SENSIPAR) 30 MG tablet Take 30 mg by mouth daily. 03/14/20   [provider]  cloNIDine (CATAPRES - DOSED IN MG/24 HR) 0.1 mg/24hr patch Place 0.1 mg onto the skin See admin instructions.  Apply patch on every Sunday 10/10/18   [provider]  cyclobenzaprine (FLEXERIL) 10 MG tablet Take 10 mg by mouth at bedtime as needed for muscle spasms.     [provider]  gentamicin cream (GARAMYCIN) 0.1 % Apply 1 application topically See admin instructions. Apply small amount to catheter exit site once daily 09/07/18   [provider]  Insulin Disposable Pump (OMNIPOD DASH 5 PACK PODS) MISC CHANGE POD EVERY 3 DAYS 90 DAYS 06/11/20   [provider]  insulin glargine (LANTUS SOLOSTAR) 100 UNIT/ML Solostar Pen  02/05/20   [provider]  insulin lispro (HUMALOG) 100 UNIT/ML injection Continue use of insulin pump with setting as previous: Restart in the morning of 03/23/2020 as Lantus was given on the morning of 03/22/2020. 03/22/20   Swayze, Ava, DO  losartan (COZAAR) 50 MG tablet Take 50 mg by mouth daily. Patient not taking: Reported on 07/25/2020 02/19/20   [provider]  metoprolol succinate (TOPROL-XL) 100 MG 24 hr tablet Take 100 mg by mouth 2 (two) times daily. 10/18/18   [provider]  sodium bicarbonate 650 MG tablet Take 1 tablet (650 mg total) by mouth 3 (three) times daily. 03/22/20   Swayze, Ava, DO    Physical Exam: Vitals:   11/01/22 0915 11/01/22 0942 11/01/22 1130 11/01/22 1354  BP: 128/83  126/80   Pulse: 98  (!) 112   Resp: (!) 38  20   Temp:  98.5 F (36.9 C)  98 F (36.7 C)  TempSrc:  Oral  Oral  SpO2: 100%  100%   Weight:      Height:        Physical Exam Constitutional:      General: She is not in acute distress.    Appearance: Normal appearance.  HENT:     Head: Normocephalic and atraumatic.     Mouth/Throat:     Mouth: Mucous membranes are moist.     Pharynx: Oropharynx is clear.  Eyes:     Extraocular Movements: Extraocular movements intact.     Pupils: Pupils are equal, round, and reactive to light.  Cardiovascular:     Rate and Rhythm: Normal rate and regular rhythm.     Pulses:  Normal pulses.     Heart sounds: Normal heart sounds.  Pulmonary:     Effort: Pulmonary effort is normal. No respiratory distress.     Breath sounds: Normal breath sounds.  Abdominal:     General: Bowel sounds are normal. There is no distension.     Palpations: Abdomen is soft.     Tenderness: There is abdominal tenderness.  Musculoskeletal:        General: No swelling or deformity.  Skin:    General: Skin is warm and dry.  Neurological:     General: No  focal deficit present.     Mental Status: Mental status is at baseline.    Labs on Admission: I have personally reviewed following labs and imaging studies  CBC: Recent Labs  Lab 11/01/22 0804  WBC 8.5  NEUTROABS 6.7  HGB 11.4*  HCT 35.1*  MCV 90.9  PLT 328    Basic Metabolic Panel: Recent Labs  Lab 11/01/22 0804  NA 139  K 3.8  CL 94*  CO2 26  GLUCOSE 115*  BUN 42*  CREATININE 10.96*  CALCIUM 9.1    GFR: Estimated Creatinine Clearance: 6.8 mL/min (A) (by C-G formula based on SCr of 10.96 mg/dL (H)).  Liver Function Tests: Recent Labs  Lab 11/01/22 0804  AST 16  ALT 16  ALKPHOS 157*  BILITOT 0.8  PROT 6.7  ALBUMIN 3.1*    Urine analysis:    Component Value Date/Time   COLORURINE STRAW (A) 11/25/2018 1224   APPEARANCEUR CLEAR 11/25/2018 1224   APPEARANCEUR Clear 06/25/2017 1042   LABSPEC 1.010 11/25/2018 1224   PHURINE 7.0 11/25/2018 1224   GLUCOSEU 50 (A) 11/25/2018 1224   HGBUR MODERATE (A) 11/25/2018 1224   BILIRUBINUR NEGATIVE 11/25/2018 1224   BILIRUBINUR neg 08/05/2017 1514   BILIRUBINUR Negative 06/25/2017 1042   KETONESUR NEGATIVE 11/25/2018 1224   PROTEINUR >=300 (A) 11/25/2018 1224   UROBILINOGEN 0.2 08/05/2017 1514   NITRITE NEGATIVE 11/25/2018 1224   LEUKOCYTESUR NEGATIVE 11/25/2018 1224    Radiological Exams on Admission: CT ABDOMEN PELVIS WO CONTRAST  Result Date: 11/01/2022 CLINICAL DATA:  Midline abdominal pain. History of peritoneal dialysis. EXAM: CT ABDOMEN AND PELVIS  WITHOUT CONTRAST TECHNIQUE: Multidetector CT imaging of the abdomen and pelvis was performed following the standard protocol without IV contrast. RADIATION DOSE REDUCTION: This exam was performed according to the departmental dose-optimization program which includes automated exposure control, adjustment of the mA and/or kV according to patient size and/or use of iterative reconstruction technique. COMPARISON:  03/20/2020. FINDINGS: Lower chest: Mild dependent atelectasis at the right lung base. Lung bases otherwise clear. Hepatobiliary: No focal liver abnormality is seen. No gallstones, gallbladder wall thickening, or biliary dilatation. Pancreas: Unremarkable. No pancreatic ductal dilatation or surrounding inflammatory changes. Spleen: Normal in size without focal abnormality. Adrenals/Urinary Tract: Bilateral renal cortical thinning with decreased renal sizes. No renal mass. No collecting system stone. No hydronephrosis. No adrenal masses. Normal ureters. Bladder mostly decompressed, otherwise unremarkable. Stomach/Bowel: Stomach is unremarkable. Small bowel and colon are normal in caliber. No wall thickening. No inflammation. Vascular/Lymphatic: Aorta normal in caliber with minimal atherosclerotic calcifications. More extensive calcification is noted along the aortic branch vessels, significantly increased when compared to the prior CT. No enlarged lymph nodes. Reproductive: 4.2 cm right adnexal/ovarian cystic mass measuring 4.2 cm. No follow-up recommended unless there are symptoms referable to the right adnexa. Other: Trace amount of ascites. Small amount of free intraperitoneal air. These findings are presumed due to the peritoneal dialysis catheter, which curls in the lower pelvis just above the bladder. Musculoskeletal: Normal. IMPRESSION: 1. No acute findings within the abdomen or pelvis. 2. Findings of chronic renal disease and peritoneal dialysis, including small kidneys with bilateral cortical thinning,  peritoneal dialysis catheter in the pelvis, and a trace amount of ascites and a small amount of free intraperitoneal air. 3. Significant aortic branch vessel calcifications that have developed since the previous CT. Electronically Signed   By: Amie Portland M.D.   On: 11/01/2022 11:04    EKG: Independently reviewed.  Sinus rhythm at 89 bpm.  Baseline  wander.   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  Sudden onset of pain while doing peritoneal dialysis not relieved with draining of fluid. > Persistent pain in the ED out of proportion.  Not responding to fentanyl or Dilaudid. > Nephrology consulted in the ED and PD nurse has drawn peritoneal fluid dwell for culture. - Observe on telemetry overnight - Continue ceftriaxone - Follow-up peritoneal cell count and culture - Continue as needed Dilaudid for severe pain - Supportive care  ESRD on PD > Ruling out SBP as above. Nephrology consulted in the ED - PD per nephrology - Work with pharmacy to confirm patient's binders  Type 1 diabetes - Continue home insulin pump   HTN - Continue home amlodipine, metoprolol  DVT prophylaxis:  heparin Code Status:   Full Family Communication:  None on admission  Disposition Plan:   Patient is from:  Home  Anticipated DC to:  Home  Anticipated DC date:  To 3 days  Anticipated DC barriers: None  Consults called:  Nephrology Admission status:  Observation, telemetry  Severity of Illness: The appropriate patient status for this patient is OBSERVATION. Observation status is judged to be reasonable and necessary in order to provide the required intensity of service to ensure  the patient's safety. The patient's presenting symptoms, physical exam findings, and initial radiographic and laboratory data in the context of their medical condition is felt to place them at decreased risk for further clinical deterioration. Furthermore, it is anticipated that the patient will be medically stable for discharge from the hospital within 2 midnights of admission.    Synetta Fail MD [facility]  How to contact the [facility] Attending or Consulting provider 7A - 7P or covering provider during after hours 7P -7A, for this patient?   Check the care team in [facility] and look for a) attending/consulting [facility] provider listed and b) the [facility] team listed Log into [email or web address] and use [place] universal password to access. If you do not have the password, please contact the hospital operator. Locate the [facility] provider you are looking for under [facility] and page to a number that you can be directly reached. If you still have difficulty reaching the provider, please page the [facility] (Director on Call) for the Hospitalists listed on amion for assistance.  11/01/2022, 2:13 PM

## 2022-11-01 NOTE — ED Notes (Signed)
Dialysis nurse back at bedside.

## 2022-11-01 NOTE — ED Notes (Signed)
Pt asked again for urine sample. She states she does not think she can stand up. Bedside commode placed in her room

## 2022-11-01 NOTE — ED Notes (Signed)
Dialysis nurse at bedside collecting sample

## 2022-11-01 NOTE — Progress Notes (Signed)
Dwell is drained and sample collected and walked to the lab.

## 2022-11-01 NOTE — ED Notes (Addendum)
Dialysis at bedside

## 2022-11-01 NOTE — ED Provider Notes (Signed)
[place] EMERGENCY DEPARTMENT AT [facility] Provider Note   CSN: [id number] Arrival date & time: 11/01/22  0759     History  Chief Complaint  Patient presents with   Abdominal Pain    Kelly Huff is a 37 y.o. female.  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  Pain is sharp and stabbing.  No fever, nausea, vomiting.  Had a bowel movement this morning which was normal.  She did have an appendectomy in 2021.  She took Tylenol at home with little relief.  She called the peritoneal dialysis nurse and she recommended that she attempt to drain to gravity, the patient drained 1 full container of 2000 mL and pain did not resolve which prompted her coming to the ER.     Home Medications Prior to Admission medications   Medication Sig Start Date End Date Taking? Authorizing Provider  medroxyPROGESTERone (PROVERA) 10 MG tablet Take one tablet by mouth daily for 10 days each month. 09/28/22  Yes [provider]  tretinoin (RETIN-A) 0.05 % cream PLEASE SEE ATTACHED FOR DETAILED DIRECTIONS 09/14/22  Yes [provider]  acetaminophen (TYLENOL) 325 MG tablet Take 2 tablets (650 mg total) by mouth every 4 (four) hours as needed for mild pain or fever. 12/05/19   Meuth, Brooke A, PA-C  amLODipine (NORVASC) 10 MG tablet Take 10 mg by mouth every evening.  11/06/18   [provider]  calcitRIOL (ROCALTROL) 0.25 MCG capsule Take 0.25 mcg by mouth daily. 02/28/20   [provider]  calcium acetate (PHOSLO) 667 MG tablet Take 2,001 mg by mouth 3 (three) times daily with meals.  11/05/18   [provider]  cinacalcet (SENSIPAR) 30 MG tablet Take 30 mg by mouth daily. 03/14/20   [provider]  cloNIDine (CATAPRES - DOSED IN MG/24 HR) 0.1 mg/24hr patch Place 0.1 mg onto  the skin See admin instructions. Apply patch on every Sunday 10/10/18   [provider]  cyclobenzaprine (FLEXERIL) 10 MG tablet Take 10 mg by mouth at bedtime as needed for muscle spasms.     [provider]  gentamicin cream (GARAMYCIN) 0.1 % Apply 1 application topically See admin instructions. Apply small amount to catheter exit site once daily 09/07/18   [provider]  insulin glargine (LANTUS SOLOSTAR) 100 UNIT/ML Solostar Pen  02/05/20   [provider]  insulin lispro (HUMALOG) 100 UNIT/ML injection Continue use of insulin pump with setting as previous: Restart in the morning of 03/23/2020 as Lantus was given on the morning of 03/22/2020. 03/22/20   Swayze, Ava, DO  losartan (COZAAR) 50 MG tablet Take 50 mg by mouth daily. Patient not taking: Reported on 07/25/2020 02/19/20   [provider]  metoprolol succinate (TOPROL-XL) 100 MG 24 hr tablet Take 100 mg by mouth 2 (two) times daily. 10/18/18   [provider]  sodium bicarbonate 650 MG tablet Take 1 tablet (650 mg total) by mouth 3 (three) times daily. 03/22/20   Swayze, Ava, DO  traZODone (DESYREL) 50 MG tablet Take 50 mg by mouth at bedtime.    [provider]      Allergies    Morphine, Peanut oil, Sulfamethoxazole-trimethoprim, Covid-19 (mrna) vaccine, Other, and Trulicity [dulaglutide]    Review of Systems   Review of Systems Ten systems reviewed and  are negative for acute change, except as noted in the HPI.   Physical Exam Updated Vital Signs BP 126/80 (BP Location: Left Arm)   Pulse (!) 112   Temp 98 F (36.7 C) (Oral)   Resp 20   Ht 5\' 7"  (1.702 m)   Wt 72.1 kg   SpO2 100%   BMI 24.90 kg/m  Physical Exam Constitutional:      Appearance: Normal appearance.  HENT:     Head: Normocephalic and atraumatic.  Eyes:     Extraocular Movements: Extraocular movements intact.     Conjunctiva/sclera: Conjunctivae normal.     Pupils: Pupils are equal, round, and  reactive to light.  Cardiovascular:     Rate and Rhythm: Normal rate and regular rhythm.     Pulses: Normal pulses.     Heart sounds: Normal heart sounds.  Pulmonary:     Effort: Pulmonary effort is normal.     Breath sounds: Normal breath sounds.  Abdominal:     General: Abdomen is flat. Bowel sounds are normal. There is no distension.     Palpations: Abdomen is soft. There is no mass.     Tenderness: There is generalized abdominal tenderness and tenderness in the right upper quadrant. There is no right CVA tenderness, left CVA tenderness, guarding or rebound.     Comments: PD site without surrounding erythema, drainage, warmth  Musculoskeletal:        General: Normal range of motion.     Cervical back: Normal range of motion. No tenderness.  Skin:    General: Skin is warm and dry.  Neurological:     General: No focal deficit present.     Mental Status: She is alert and oriented to person, place, and time.  Psychiatric:        Mood and Affect: Mood normal.        Behavior: Behavior normal.     ED Results / Procedures / Treatments   Labs (all labs ordered are listed, but only abnormal results are displayed) Labs Reviewed  CBC WITH DIFFERENTIAL/PLATELET - Abnormal; Notable for the following components:      Result Value   RBC 3.86 (*)    Hemoglobin 11.4 (*)    HCT 35.1 (*)    All other components within normal limits  COMPREHENSIVE METABOLIC PANEL - Abnormal; Notable for the following components:   Chloride 94 (*)    Glucose, Bld 115 (*)    BUN 42 (*)    Creatinine, Ser 10.96 (*)    Albumin 3.1 (*)    Alkaline Phosphatase 157 (*)    GFR, Estimated 4 (*)    Anion gap 19 (*)    All other components within normal limits  BODY FLUID CELL COUNT WITH DIFFERENTIAL - Abnormal; Notable for the following components:   Color, Fluid COLORLESS (*)    Appearance, Fluid CLOUDY (*)    Total Nucleated Cell Count, Fluid 5,329 (*)    Neutrophil Count, Fluid 88 (*)     Monocyte-Macrophage-Serous Fluid 10 (*)    All other components within normal limits  BODY FLUID CULTURE W GRAM STAIN  LIPASE, BLOOD  HCG, QUANTITATIVE, PREGNANCY  URINALYSIS, ROUTINE W REFLEX MICROSCOPIC  PREGNANCY, URINE  HIV ANTIBODY (ROUTINE TESTING W REFLEX)  PATHOLOGIST SMEAR REVIEW    EKG None  Radiology CT ABDOMEN PELVIS WO CONTRAST  Result Date: 11/01/2022 CLINICAL DATA:  Midline abdominal pain. History of peritoneal dialysis. EXAM: CT ABDOMEN AND PELVIS WITHOUT CONTRAST TECHNIQUE: Multidetector CT imaging  of the abdomen and pelvis was performed following the standard protocol without IV contrast. RADIATION DOSE REDUCTION: This exam was performed according to the departmental dose-optimization program which includes automated exposure control, adjustment of the mA and/or kV according to patient size and/or use of iterative reconstruction technique. COMPARISON:  03/20/2020. FINDINGS: Lower chest: Mild dependent atelectasis at the right lung base. Lung bases otherwise clear. Hepatobiliary: No focal liver abnormality is seen. No gallstones, gallbladder wall thickening, or biliary dilatation. Pancreas: Unremarkable. No pancreatic ductal dilatation or surrounding inflammatory changes. Spleen: Normal in size without focal abnormality. Adrenals/Urinary Tract: Bilateral renal cortical thinning with decreased renal sizes. No renal mass. No collecting system stone. No hydronephrosis. No adrenal masses. Normal ureters. Bladder mostly decompressed, otherwise unremarkable. Stomach/Bowel: Stomach is unremarkable. Small bowel and colon are normal in caliber. No wall thickening. No inflammation. Vascular/Lymphatic: Aorta normal in caliber with minimal atherosclerotic calcifications. More extensive calcification is noted along the aortic branch vessels, significantly increased when compared to the prior CT. No enlarged lymph nodes. Reproductive: 4.2 cm right adnexal/ovarian cystic mass measuring 4.2 cm. No  follow-up recommended unless there are symptoms referable to the right adnexa. Other: Trace amount of ascites. Small amount of free intraperitoneal air. These findings are presumed due to the peritoneal dialysis catheter, which curls in the lower pelvis just above the bladder. Musculoskeletal: Normal. IMPRESSION: 1. No acute findings within the abdomen or pelvis. 2. Findings of chronic renal disease and peritoneal dialysis, including small kidneys with bilateral cortical thinning, peritoneal dialysis catheter in the pelvis, and a trace amount of ascites and a small amount of free intraperitoneal air. 3. Significant aortic branch vessel calcifications that have developed since the previous CT. Electronically Signed   By: Amie Portland M.D.   On: 11/01/2022 11:04    Procedures Procedures    Medications Ordered in ED Medications  cefTRIAXone (ROCEPHIN) 2 g in sodium chloride 0.9 % 100 mL IVPB (0 g Intravenous Stopped 11/01/22 1330)  heparin injection 5,000 Units (has no administration in time range)  sodium chloride flush (NS) 0.9 % injection 3 mL (3 mLs Intravenous Not Given 11/01/22 1452)  acetaminophen (TYLENOL) tablet 650 mg (has no administration in time range)    Or  acetaminophen (TYLENOL) suppository 650 mg (has no administration in time range)  polyethylene glycol (MIRALAX / GLYCOLAX) packet 17 g (has no administration in time range)  fentaNYL (SUBLIMAZE) injection 25 mcg (has no administration in time range)  insulin pump (has no administration in time range)  amLODipine (NORVASC) tablet 10 mg (has no administration in time range)  metoprolol succinate (TOPROL-XL) 24 hr tablet 100 mg (has no administration in time range)  gentamicin cream (GARAMYCIN) 0.1 % 1 Application (has no administration in time range)  dialysis solution 1.5% low-MG/low-CA dianeal solution (has no administration in time range)  HYDROmorphone (DILAUDID) injection 1 mg (1 mg Intravenous Given 11/01/22 0833)  ondansetron  (ZOFRAN) injection 4 mg (4 mg Intravenous Given 11/01/22 0833)  diphenhydrAMINE (BENADRYL) injection 25 mg (25 mg Intravenous Given 11/01/22 0907)  fentaNYL (SUBLIMAZE) injection 50 mcg (50 mcg Intravenous Given 11/01/22 1003)  fentaNYL (SUBLIMAZE) injection 50 mcg (50 mcg Intravenous Given 11/01/22 1134)  fentaNYL (SUBLIMAZE) injection 25 mcg (25 mcg Intravenous Given 11/01/22 1352)    ED Course/ Medical Decision Making/ A&P                             Medical Decision Making Amount and/or Complexity of Data  Reviewed Labs: ordered. Radiology: ordered.  Risk Prescription drug management. Decision regarding hospitalization.   37 year old female presenting with acute onset of abdominal pain while performing peritoneal dialysis.  On arrival, vitals are stable, afebrile.  She has diffuse tenderness to the abdomen, nonfocal.  Differential is broad but includes SBP, diverticulitis, constipation, cholecystitis, underlying abscess, colitis  Labs ordered, reviewed, CBC without leukocytosis, hemoglobin of 11.4 which which appears stable.  CMP without any significant electrode abnormalities, creatinine consistent with ESRD, elevated anion gap without acidosis likely secondary to uremia.  Pregnancy is negative.  EKG obtained, reviewed, normal sinus rhythm ischemic changes.  CT of the abdomen pelvis obtained without contrast, reviewed, agree with radiology read, negative for acute findings.  Discussed with Dr. Arrie Aran with nephrology, dialysis RN was sent down to obtain peritoneal culture.  Patient continued to complain of severe pain despite Dilaudid, 2 mg of fentanyl.  She is allergic to morphine.  Per discussion with the patient and Dr. Lynelle Doctor, will empirically treat for SBP and admit to the hospitalist service for pain control awaiting culture results.  Final Clinical Impression(s) / ED Diagnoses Final diagnoses:  Abdominal pain, unspecified abdominal location    Rx / DC Orders ED Discharge Orders      None         Leone Brand 11/01/22 1557    Linwood Dibbles, MD 11/04/22 [phone]

## 2022-11-01 NOTE — Progress Notes (Addendum)
When I arrived to collect sample catheter wouldn't drain. Dr. Arrie Aran ordered Pd solution 1.5% 1000 ml placed to dwell to obtain sample. Fluid is placed and dwelling will return in 30 minutes to  drain and collect sample.

## 2022-11-02 DIAGNOSIS — R109 Unspecified abdominal pain: Principal | ICD-10-CM

## 2022-11-02 LAB — RENAL FUNCTION PANEL
Albumin: 2.2 g/dL — ABNORMAL LOW (ref 3.5–5.0)
Anion gap: 13 (ref 5–15)
BUN: 46 mg/dL — ABNORMAL HIGH (ref 6–20)
CO2: 23 mmol/L (ref 22–32)
Calcium: 8.1 mg/dL — ABNORMAL LOW (ref 8.9–10.3)
Chloride: 98 mmol/L (ref 98–111)
Creatinine, Ser: 9.98 mg/dL — ABNORMAL HIGH (ref 0.44–1.00)
GFR, Estimated: 5 mL/min — ABNORMAL LOW (ref >60)
Glucose, Bld: 64 mg/dL — ABNORMAL LOW (ref 70–99)
Phosphorus: 4 mg/dL (ref 2.5–4.6)
Potassium: 3.7 mmol/L (ref 3.5–5.1)
Sodium: 134 mmol/L — ABNORMAL LOW (ref 135–145)

## 2022-11-02 LAB — PATHOLOGIST SMEAR REVIEW

## 2022-11-02 LAB — CBC
HCT: 31.1 % — ABNORMAL LOW (ref 36.0–46.0)
Hemoglobin: 10 g/dL — ABNORMAL LOW (ref 12.0–15.0)
MCH: 28.3 pg (ref 26.0–34.0)
MCHC: 32.2 g/dL (ref 30.0–36.0)
MCV: 88.1 fL (ref 80.0–100.0)
Platelets: 296 10*3/uL (ref 150–400)
RBC: 3.53 MIL/uL — ABNORMAL LOW (ref 3.87–5.11)
RDW: 14 % (ref 11.5–15.5)
WBC: 8.6 10*3/uL (ref 4.0–10.5)
nRBC: 0 % (ref 0.0–0.2)

## 2022-11-02 LAB — HIV ANTIBODY (ROUTINE TESTING W REFLEX): HIV Screen 4th Generation wRfx: NONREACTIVE

## 2022-11-02 LAB — BODY FLUID CULTURE W GRAM STAIN

## 2022-11-02 MED ORDER — VANCOMYCIN HCL 1.25 G IV SOLR
1250.0000 mg | Freq: Once | INTRAVENOUS | Status: DC
  Filled 2022-11-02: qty 25

## 2022-11-02 MED ORDER — HYDROXYZINE HCL 25 MG PO TABS
25.0000 mg | ORAL_TABLET | Freq: Three times a day (TID) | ORAL | Status: DC | PRN
  Administered 2022-11-02: 25 mg via ORAL
  Filled 2022-11-02: qty 1

## 2022-11-02 MED ORDER — VANCOMYCIN HCL 1.25 G IV SOLR
1250.0000 mg | Freq: Once | INTRAVENOUS | Status: AC
  Administered 2022-11-02: 1250 mg via INTRAVENOUS
  Filled 2022-11-02: qty 25

## 2022-11-02 NOTE — Progress Notes (Signed)
Pharmacy Antibiotic Note  Kelly Huff is a 37 y.o. female admitted on 11/01/2022 with  PD associated peritonitis .  Pharmacy has been consulted for vancomycin dosing. Per MD, patient is discharging today and needs dose prior to discharge.  With a one-time dose of 1250mg  (~16mg /kg), can expect an estimated level of ~18 mg/L.  Plan: Give vancomycin 1250mg  IV x1 as loading dose prior to discharge. F/u in 3-5 days for redosing as indicated.  Height: 5\' 7"  (170.2 cm) Weight: 78 kg (171 lb 15.3 oz) IBW/kg (Calculated) : 61.6  Temp (24hrs), Avg:98.7 F (37.1 C), Min:98 F (36.7 C), Max:99.2 F (37.3 C)  Recent Labs  Lab 11/01/22 0804 11/02/22 0557  WBC 8.5 8.6  CREATININE 10.96* 9.98*    Estimated Creatinine Clearance: 8.2 mL/min (A) (by C-G formula based on SCr of 9.98 mg/dL (H)).    Allergies  Allergen Reactions   Morphine Itching and Other (See Comments)    Hallucinations, also   Peanut Oil Swelling and Other (See Comments)    Tongue swelling as a child   Sulfamethoxazole-Trimethoprim Hives and Other (See Comments)   Covid-19 (Mrna) Vaccine Other (See Comments)    Was told to not take this again   Trulicity [Dulaglutide] Other (See Comments)    She is DM Type 1. Was prescribed but damaged her kidneys    Antimicrobials this admission: Ceftriaxone 7/7 >>  Dose adjustments this admission:  Microbiology results: 7/7 Peritoneal Cx: no organisms seen, reincubated  Thank you for allowing pharmacy to be a part of this patient's care.  Nicole Kindred, PharmD PGY1 Pharmacy Resident 11/02/2022 10:29 AM

## 2022-11-02 NOTE — Progress Notes (Signed)
Pt requested to be premedicated with fentyl IV prior to her first initial drain Only drained 73ml--went to next cycle

## 2022-11-02 NOTE — Care Management Obs Status (Cosign Needed)
MEDICARE OBSERVATION STATUS NOTIFICATION   Patient Details  Name: Kelly Huff MRN: [id number] Date of Birth: Oct 30, 1985   Medicare Observation Status Notification Given:  Yes    Janae Bridgeman, RN 11/02/2022, 12:33 PM

## 2022-11-02 NOTE — Progress Notes (Signed)
   11/02/22 1121  Peritoneal Catheter Right lower abdomen Continuous cycling  No placement date or time found.   Catheter Location: Right lower abdomen  Dialysis Type: Continuous cycling  Site Assessment Clean, Dry, Intact  Drainage Description None  Catheter status Deaccessed  Dressing Gauze/Drain sponge  Dressing Status Clean, Dry, Intact  Completion  Treatment Status Complete  Initial Drain Volume 146 (ml)  Average Dwell Time-Hour(s) 1  Average Dwell Time-Min(s) 20  Average Drain Time 23 (minutes)  Total Therapy Volume 14782 (ml)  Total Therapy Time-Hour(s) 12  Total Therapy Time-Min(s) 51  Weight after Drain 171 lb 11.8 oz (77.9 kg) (bed)  Effluent Appearance Cloudy;Yellow  Fluid Balance - CCPD  Total UF (+ value on cycler, pt loss) 52 mL (ml)  Procedure Comments  Tolerated treatment well? Yes

## 2022-11-02 NOTE — Progress Notes (Signed)
PD tx initation note:  21:00 pm Pre TX VS:   Pre TX weight: 74 kg  PD treatment initiated via aseptic technique. Consent signed and in chart. Patient is alert and oriented. Complaints of abdominal pain. No specimen collected. PD exit site clean, dry and intact.  Bedside RN educated on PD machine and how to contact tech support when PD machine alarms.

## 2022-11-02 NOTE — Progress Notes (Signed)
[facility] Progress Note   Subjective:   Feeling improved this AM but by early afternoon increase in abd pain.  No issues with PD overnight.   Objective Vitals:   11/01/22 1940 11/02/22 0400 11/02/22 0501 11/02/22 0755  BP: 122/71  127/76 117/82  Pulse: 98  83 81  Resp: 18  18 18   Temp: 99 F (37.2 C)  98 F (36.7 C) 99 F (37.2 C)  TempSrc: Oral  Oral   SpO2: 98%  100% 99%  Weight:  78 kg    Height:       Physical Exam General: well appearing  Heart:RRR Lungs: clear Abdomen: cloudy effluent noted, + peritoneal signs with guarding Extremities: no edema  Additional Objective Labs: Basic Metabolic Panel: Recent Labs  Lab 11/01/22 0804 11/02/22 0557  NA 139 134*  K 3.8 3.7  CL 94* 98  CO2 26 23  GLUCOSE 115* 64*  BUN 42* 46*  CREATININE 10.96* 9.98*  CALCIUM 9.1 8.1*  PHOS  --  4.0   Liver Function Tests: Recent Labs  Lab 11/01/22 0804 11/02/22 0557  AST 16  --   ALT 16  --   ALKPHOS 157*  --   BILITOT 0.8  --   PROT 6.7  --   ALBUMIN 3.1* 2.2*   Recent Labs  Lab 11/01/22 0804  LIPASE 31   CBC: Recent Labs  Lab 11/01/22 0804 11/02/22 0557  WBC 8.5 8.6  NEUTROABS 6.7  --   HGB 11.4* 10.0*  HCT 35.1* 31.1*  MCV 90.9 88.1  PLT 328 296   Blood Culture    Component Value Date/Time   SDES PERITONEAL DIALYSATE 11/01/2022 1150   SPECREQUEST Immunocompromised 11/01/2022 1150   CULT  11/01/2022 1150    CULTURE REINCUBATED FOR BETTER GROWTH Performed at [facility], [address] [place], [place] [address]    REPTSTATUS PENDING 11/01/2022 1150    Cardiac Enzymes: No results for input(s): "CKTOTAL", "CKMB", "CKMBINDEX", "TROPONINI" in the last 168 hours. CBG: No results for input(s): "GLUCAP" in the last 168 hours. Iron Studies: No results for input(s): "IRON", "TIBC", "TRANSFERRIN", "FERRITIN" in the last 72 hours. @lablastinr3 @ Studies/Results: CT ABDOMEN PELVIS WO CONTRAST  Result Date: 11/01/2022 CLINICAL  DATA:  Midline abdominal pain. History of peritoneal dialysis. EXAM: CT ABDOMEN AND PELVIS WITHOUT CONTRAST TECHNIQUE: Multidetector CT imaging of the abdomen and pelvis was performed following the standard protocol without IV contrast. RADIATION DOSE REDUCTION: This exam was performed according to the departmental dose-optimization program which includes automated exposure control, adjustment of the mA and/or kV according to patient size and/or use of iterative reconstruction technique. COMPARISON:  03/20/2020. FINDINGS: Lower chest: Mild dependent atelectasis at the right lung base. Lung bases otherwise clear. Hepatobiliary: No focal liver abnormality is seen. No gallstones, gallbladder wall thickening, or biliary dilatation. Pancreas: Unremarkable. No pancreatic ductal dilatation or surrounding inflammatory changes. Spleen: Normal in size without focal abnormality. Adrenals/Urinary Tract: Bilateral renal cortical thinning with decreased renal sizes. No renal mass. No collecting system stone. No hydronephrosis. No adrenal masses. Normal ureters. Bladder mostly decompressed, otherwise unremarkable. Stomach/Bowel: Stomach is unremarkable. Small bowel and colon are normal in caliber. No wall thickening. No inflammation. Vascular/Lymphatic: Aorta normal in caliber with minimal atherosclerotic calcifications. More extensive calcification is noted along the aortic branch vessels, significantly increased when compared to the prior CT. No enlarged lymph nodes. Reproductive: 4.2 cm right adnexal/ovarian cystic mass measuring 4.2 cm. No follow-up recommended unless there are symptoms referable to the  right adnexa. Other: Trace amount of ascites. Small amount of free intraperitoneal air. These findings are presumed due to the peritoneal dialysis catheter, which curls in the lower pelvis just above the bladder. Musculoskeletal: Normal. IMPRESSION: 1. No acute findings within the abdomen or pelvis. 2. Findings of chronic renal  disease and peritoneal dialysis, including small kidneys with bilateral cortical thinning, peritoneal dialysis catheter in the pelvis, and a trace amount of ascites and a small amount of free intraperitoneal air. 3. Significant aortic branch vessel calcifications that have developed since the previous CT. Electronically Signed   By: Amie Portland M.D.   On: 11/01/2022 11:04   Medications:  cefTRIAXone (ROCEPHIN)  IV Stopped (11/01/22 1330)   dialysis solution 1.5% low-MG/low-CA      amLODipine  10 mg Oral QPM   gentamicin cream  1 Application Topical Daily   heparin  5,000 Units Subcutaneous Q8H   insulin pump   Subcutaneous TID WC, HS, 0200   metoprolol succinate  100 mg Oral BID   sodium chloride flush  3 mL Intravenous Q12H    Dialysis Orders:  PD Rx per patient: 4 exchanges + 1 day exchange;  2L fill volume, 2hr dwell time. Last fill 1.2L.  Will plan for 5 exchanges, no pause in the hospital   Assessment/Plan:  Peritonitis:  cell count 5329 TNC 88% PMN c/w peritonitis.  Noted breach in technique as etiology.  Dosed with ceftriaxone.  Culture pending.  Dose vanc today for gram + coverage until culture returns.  D/w outpt PD clinic - she is transitioning from [place] to [place] and still needs a home visit prior to admission.  Scheduled for 7/9 after which she could receive abx at outpt center but this is not currently an option until she is admitted formally.  ESRD on PD:  cont usual tx; warned of possibility of decreased UF around peritonitis episode.   Anemia: Hb 10s, outpt ESA BMM: cont home meds Nutrition: Renal diet.  Type 1 DM: on insulin pump, per primary.   Dispo: remain admitted overnight.  Estill Bakes MD 11/02/2022, 10:13 AM  [facility] Pager: [phone]

## 2022-11-02 NOTE — Inpatient Diabetes Management (Addendum)
Inpatient Diabetes Program Recommendations  AACE/ADA: New Consensus Statement on Inpatient Glycemic Control (2015)  Target Ranges:  Prepandial:   less than 140 mg/dL      Peak postprandial:   less than 180 mg/dL (1-2 hours)      Critically ill patients:  140 - 180 mg/dL   Lab Results  Component Value Date   GLUCAP 197 (H) 03/22/2020   HGBA1C 6.8 (H) 03/21/2020    Review of Glycemic Control  Latest Reference Range & Units 03/21/20 09:50 03/21/20 10:46 03/21/20 11:49 03/21/20 12:26 03/21/20 14:13 03/21/20 15:25 03/21/20 17:00 03/21/20 19:20 03/21/20 22:46 03/22/20 04:37 03/22/20 08:01 03/22/20 11:51 03/22/20 12:21 03/22/20 12:59  Glucose-Capillary 70 - 99 mg/dL 98 95 161 (H) 096 (H) 045 (H) 179 (H) 141 (H) 138 (H) 294 (H) 337 (H) 323 (H) 65 (L) 65 (L) 197 (H)   Diabetes history: DM 1 since age 1 Outpatient Diabetes medications:  Omnipod 5 settings: Time Basal I:C CF Target  12 am 0.6 1:15 1:35 110  8 am 0.6 1:15 1:35 120  10 am 0.6 1:15 1:35 120  12 pm 0.6 1:15 1:35 120  3 pm 0.6 1:15 1:35 120  8 pm 0.6 1:15 1:35 120  10 pm 0.6 1:15 1:35 110   IOB: 4 hours TDD: 23 units per day Basal: 64%, bolus: 36% Carb entries: 2.7 per day ~77 carbs Auto mode: 75%   Endocrinologist Dr. Hurley Cisco with [facility] Last visit on 6/11 pump adjustments made  Current orders for Inpatient glycemic control:  Insulin pump order set  Glucose trends at goal. Follow while here.  Thanks, Christena Deem RN, MSN, BC-ADM Inpatient Diabetes Coordinator Team Pager [phone] (8a-5p)

## 2022-11-02 NOTE — Progress Notes (Signed)
Progress Note   Patient: Kelly Huff ZOX:[id number] DOB: 1985-07-19 DOA: 11/01/2022     0 DOS: the patient was seen and examined on 11/02/2022   Brief hospital course: EIRINI MCCLURKIN is a 37 y.o. female with medical history significant of DM 1, ESRD on PD, TTP, HHS presenting with abdominal pain.   Patient was performing PD at home and had sudden onset abdominal pain described as sharp/stabbing sensation.  She tried Tylenol at home without relief.  She called her peritoneal dialysis nurse who recommended draining it to gravity and she drained about 2 L but pain persisted.  Due to this she presented to the ED for further evaluation.  Peritoneal fluid Alysis came back positive for WBC count of 5329, clearly [place] peritonitis nephrology is following.  Patient is on IV ceftriaxone initiated vancomycin.  Patient still continues to complain of abdominal pain but with mild improvement  Assessment and Plan:  Acute peritonitis leading to abdominal pain: - Peritoneal fluid analysis came back WBC count of 5329  -Nephrology is following, patient is placed on ceftriaxone and vancomycin also initiated - Follow-up Peritoneal  fluid culture -Patient still has abdominal pain continue management with pain medication.    ESRD on PD  Nephrology consulted in the ED - PD per nephrology - Work with pharmacy to confirm patient's binders   Type 1 diabetes - Continue home insulin pump    HTN - Continue home amlodipine, metoprolol   DVT prophylaxis:       heparin     Subjective: Patient was seen and examined at bedside today.  Patient continues to complain of abdominal pain needing pain medication.  Peritoneal fluid analysis positive for peritonitis.  Physical Exam: Vitals:   11/01/22 1940 11/02/22 0400 11/02/22 0501 11/02/22 0755  BP: 122/71  127/76 117/82  Pulse: 98  83 81  Resp: 18  18 18   Temp: 99 F (37.2 C)  98 F (36.7 C) 99 F (37.2 C)  TempSrc: Oral  Oral   SpO2: 98%  100% 99%   Weight:  78 kg    Height:       Constitutional:      General: She is not in acute distress.    Appearance: Normal appearance.  HENT:     Head: Normocephalic and atraumatic.     Mouth/Throat:     Mouth: Mucous membranes are moist.     Pharynx: Oropharynx is clear.  Eyes:     Extraocular Movements: Extraocular movements intact.     Pupils: Pupils are equal, round, and reactive to light.  Cardiovascular:     Rate and Rhythm: Normal rate and regular rhythm.     Pulses: Normal pulses.     Heart sounds: Normal heart sounds.  Pulmonary:     Effort: Pulmonary effort is normal. No respiratory distress.     Breath sounds: Normal breath sounds.  Abdominal:     General: Bowel sounds are normal. There is no distension.     Palpations: Abdomen is soft.     Tenderness: There is abdominal tenderness.  Musculoskeletal:        General: No swelling or deformity.  Skin:    General: Skin is warm and dry.  Neurological:     General: No focal deficit present.     Mental Status: Mental status is at baseline. Data Reviewed:  Peritoneal fluid positive for peritonitis  Family Communication: Patient is alert and oriented  Disposition: Status is: Observation The patient will require care spanning >  2 midnights and should be moved to inpatient because: Acute peritonitis  Planned Discharge Destination: Home    Time spent: 35 minutes  Author: Harold Hedge, MD 11/02/2022 1:53 PM  For on call review [email or web address].

## 2022-11-02 NOTE — Plan of Care (Signed)

## 2022-11-02 NOTE — Progress Notes (Signed)
Pt refuses CBNG fingersticks.  She has an insulin pump and uses Dexcom to check her sugar.  She agreed to let us see on her Dexcom the BG results and how much insulin she gives herself.

## 2022-11-03 DIAGNOSIS — R109 Unspecified abdominal pain: Secondary | ICD-10-CM | POA: Diagnosis not present

## 2022-11-03 MED ORDER — DOCUSATE SODIUM 100 MG PO CAPS: 100.0000 mg | ORAL_CAPSULE | Freq: Two times a day (BID) | ORAL | Status: DC | PRN

## 2022-11-03 NOTE — Discharge Summary (Signed)
Physician Discharge Summary   Patient: Kelly Huff MRN: [id number] DOB: May 07, 1985  Admit date:     11/01/2022  Discharge date: 11/03/22  Discharge Physician: Harold Hedge   PCP: Alain Marion, MD   Recommendations at discharge:   Follow-up with PCP in 1 week Follow-up with outpatient PD clinic and nephrologist.  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 *  Hospital Course: Kelly Huff is a 37 y.o. female with medical history significant of DM 1, ESRD on PD, TTP, HHS presenting with abdominal pain.   Patient was performing PD at home and had sudden onset abdominal pain described as sharp/stabbing sensation.  She tried Tylenol at home without relief.  She called her peritoneal dialysis nurse who recommended draining it to gravity and she drained about 2 L but pain persisted.  Patient's peritoneal fluid was analyzed which showed very high cell count suggestive of peritonitis.  Patient was started on IV ceftriaxone.  Nephrologist consulted also evaluated the patient.  PD fluid was sent for culture which however did not grow anything for 48 hours.  Patient however showed significant improvement clinically.  Nephrologist cleared the patient for discharge and advised that he has arranged for antibiotics vancomycin intraperitoneal to be given at the PD clinic.  Plan was to repeat peritoneal fluid cell count again and repeat culture.  Postdischarge patient will also need to follow-up with PCP in 1 week.    Assessment and Plan: Acute peritonitis leading to abdominal pain: - Peritoneal fluid analysis came back WBC count of 5329  -Nephrology is following, patient is placed on ceftriaxone  and vancomycin also initiated - Follow-up Peritoneal  fluid culture ngtd - Clinical symptoms significantly improved, cleared for discharge by nephrology 1 continue intraperitoneal vancomycin at outpatient PD clinic   ESRD on PD   Nephrology consulted in the ED - PD per nephrology - Work with pharmacy to confirm patient's binders   Type 1 diabetes - Continue home insulin pump    HTN - Continue home amlodipine, metoprolol   DVT prophylaxis:       heparin       Consultants: Nephrology Procedures performed: Peritoneal dialysis Disposition: Home Diet recommendation:  Discharge Diet Orders (From admission, onward)     Start     Ordered   11/03/22 0000  Diet - low sodium heart healthy        11/03/22 1240           Renal diet DISCHARGE MEDICATION: Allergies as of 11/03/2022       Reactions   Morphine Itching, Other (See Comments)   Hallucinations, also   Peanut Oil Swelling, Other (See Comments)   Tongue swelling as a child   Sulfamethoxazole-trimethoprim Hives, Other (See Comments)   Covid-19 (mrna) Vaccine Other (See Comments)   Was told to not take this again   Trulicity [dulaglutide] Other (See Comments)   She is DM Type 1. Was prescribed but damaged her kidneys        Medication List     TAKE these medications    acetaminophen 325 MG tablet Commonly known as: TYLENOL Take 2 tablets (650 mg total) by mouth every 4 (four) hours as needed for mild pain or fever.  amLODipine 10 MG tablet Commonly known as: NORVASC Take 10 mg by mouth in the morning.   aspirin EC 81 MG tablet Take 81 mg by mouth daily. Swallow whole.   Auryxia 1 GM 210 MG(Fe) tablet Generic drug: ferric citrate Take 210-630 mg by mouth See admin instructions. Take 630 mg by mouth three times a day with meals and 210 mg with each snack   clindamycin-benzoyl peroxide gel Commonly known as: BENZACLIN Apply 1 Application topically in the morning.   cyclobenzaprine 10 MG  tablet Commonly known as: FLEXERIL Take 10 mg by mouth at bedtime as needed for muscle spasms.   Dexcom G6 Sensor Misc Inject 1 Device into the skin See admin instructions. Place 1 new device into the skin every 10 days   HumaLOG 100 UNIT/ML injection Generic drug: insulin lispro Inject into the skin See admin instructions. Per pump every 3 days- continuous   insulin lispro 100 UNIT/ML injection Commonly known as: HUMALOG Continue use of insulin pump with setting as previous: Restart in the morning of 03/23/2020 as Lantus was given on the morning of 03/22/2020.   Lantus SoloStar 100 UNIT/ML Solostar Pen Generic drug: insulin glargine Inject into the skin See admin instructions. Use as directed for insulin pump failure   medroxyPROGESTERone 10 MG tablet Commonly known as: PROVERA Take 10 mg by mouth See admin instructions. Take 10 mg by mouth on days 1-10 of each month   metoprolol succinate 50 MG 24 hr tablet Commonly known as: TOPROL-XL Take 100 mg by mouth daily. Take with or immediately following a meal.   NEPHRO-VITE PO Take 1 tablet by mouth 3 (three) times a week.   Omnipod 5 G6 Pods (Gen 5) Misc Inject 1 Device into the skin every 3 (three) days.   sodium bicarbonate 650 MG tablet Take 1 tablet (650 mg total) by mouth 3 (three) times daily.   traZODone 50 MG tablet Commonly known as: DESYREL Take 50 mg by mouth at bedtime as needed for sleep.   tretinoin 0.05 % cream Commonly known as: RETIN-A Apply 1 application  topically See admin instructions. Apply as directed daily- affected areas               Discharge Care Instructions  (From admission, onward)           Start     Ordered   11/03/22 0000  Discharge wound care:       Comments: Clean skin near exit site with chloraprep swab sticks.  Starting at catheter, use circular pattern around exit site, moving towards outer edges of area covered by dressing.  Apply gentamicin cream to site once daily.   Cover with dry dressing.   11/03/22 1240            Discharge Exam: Filed Weights   11/01/22 0811 11/02/22 0400 11/03/22 0722  Weight: 72.1 kg 78 kg 76.4 kg   Patient was seen and examined bedside today, alert and oriented not in any distress CVS: S1-S2 positive Respiratory: Bilateral clear and equal breath sounds Abdomen: Soft mild tenderness to palpation.  Condition at discharge: fair  The results of significant diagnostics from this hospitalization (including imaging, microbiology, ancillary and laboratory) are listed below for reference.   Imaging Studies: CT ABDOMEN PELVIS WO CONTRAST  Result Date: 11/01/2022 CLINICAL DATA:  Midline abdominal pain. History of peritoneal dialysis. EXAM: CT ABDOMEN AND PELVIS WITHOUT CONTRAST TECHNIQUE: Multidetector CT imaging of the abdomen and pelvis was performed following the standard protocol without  IV contrast. RADIATION DOSE REDUCTION: This exam was performed according to the departmental dose-optimization program which includes automated exposure control, adjustment of the mA and/or kV according to patient size and/or use of iterative reconstruction technique. COMPARISON:  03/20/2020. FINDINGS: Lower chest: Mild dependent atelectasis at the right lung base. Lung bases otherwise clear. Hepatobiliary: No focal liver abnormality is seen. No gallstones, gallbladder wall thickening, or biliary dilatation. Pancreas: Unremarkable. No pancreatic ductal dilatation or surrounding inflammatory changes. Spleen: Normal in size without focal abnormality. Adrenals/Urinary Tract: Bilateral renal cortical thinning with decreased renal sizes. No renal mass. No collecting system stone. No hydronephrosis. No adrenal masses. Normal ureters. Bladder mostly decompressed, otherwise unremarkable. Stomach/Bowel: Stomach is unremarkable. Small bowel and colon are normal in caliber. No wall thickening. No inflammation. Vascular/Lymphatic: Aorta normal in caliber with  minimal atherosclerotic calcifications. More extensive calcification is noted along the aortic branch vessels, significantly increased when compared to the prior CT. No enlarged lymph nodes. Reproductive: 4.2 cm right adnexal/ovarian cystic mass measuring 4.2 cm. No follow-up recommended unless there are symptoms referable to the right adnexa. Other: Trace amount of ascites. Small amount of free intraperitoneal air. These findings are presumed due to the peritoneal dialysis catheter, which curls in the lower pelvis just above the bladder. Musculoskeletal: Normal. IMPRESSION: 1. No acute findings within the abdomen or pelvis. 2. Findings of chronic renal disease and peritoneal dialysis, including small kidneys with bilateral cortical thinning, peritoneal dialysis catheter in the pelvis, and a trace amount of ascites and a small amount of free intraperitoneal air. 3. Significant aortic branch vessel calcifications that have developed since the previous CT. Electronically Signed   By: Amie Portland M.D.   On: 11/01/2022 11:04    Microbiology: Results for orders placed or performed during the hospital encounter of 11/01/22  Body fluid culture w Gram Stain     Status: None (Preliminary result)   Collection Time: 11/01/22 11:50 AM   Specimen: Peritoneal Dialysate; Body Fluid  Result Value Ref Range Status   Specimen Description PERITONEAL DIALYSATE  Final   Special Requests Immunocompromised  Final   Gram Stain   Final    FEW WBC PRESENT,BOTH PMN AND MONONUCLEAR NO ORGANISMS SEEN    Culture   Final    RARE STAPHYLOCOCCUS EPIDERMIDIS SUSCEPTIBILITIES TO FOLLOW Performed at [facility], [address], [name], [place] [address]    Report Status PENDING  Incomplete    Labs: CBC: Recent Labs  Lab 11/01/22 0804 11/02/22 0557  WBC 8.5 8.6  NEUTROABS 6.7  --   HGB 11.4* 10.0*  HCT 35.1* 31.1*  MCV 90.9 88.1  PLT 328 296   Basic Metabolic Panel: Recent Labs  Lab 11/01/22 0804  11/02/22 0557  NA 139 134*  K 3.8 3.7  CL 94* 98  CO2 26 23  GLUCOSE 115* 64*  BUN 42* 46*  CREATININE 10.96* 9.98*  CALCIUM 9.1 8.1*  PHOS  --  4.0   Liver Function Tests: Recent Labs  Lab 11/01/22 0804 11/02/22 0557  AST 16  --   ALT 16  --   ALKPHOS 157*  --   BILITOT 0.8  --   PROT 6.7  --   ALBUMIN 3.1* 2.2*   CBG: No results for input(s): "GLUCAP" in the last 168 hours.  Discharge time spent: greater than 30 minutes.  Signed: Harold Hedge, MD [facility] 11/03/2022

## 2022-11-03 NOTE — Progress Notes (Signed)
Transition of Care [facility]) - Inpatient Brief Assessment   Patient Details  Name: Kelly Huff MRN: [id number] Date of Birth: Jan 17, 1986  [facility]) CM/SW Contact:    Janae Bridgeman, RN Phone Number: 11/03/2022, 1:20 PM   Clinical Narrative: Patient is being discharged home today - No TOC needs at this time.   Transition of Care Asessment: Insurance and Status: (P) Insurance coverage has been reviewed Patient has primary care physician: (P) Yes Home environment has been reviewed: (P) Yes Prior level of function:: (P) Independent Prior/Current Home Services: (P) No current home services Social Determinants of Health Reivew: (P) SDOH reviewed no interventions necessary Readmission risk has been reviewed: (P) Yes Transition of care needs: (P) no transition of care needs at this time

## 2022-11-03 NOTE — Progress Notes (Addendum)
[facility] Progress Note   Subjective:   Feeling improved; abd pain significantly improved but still some linger when moves around.  Fluid more clear overnight with PD.  Desires d/c  Objective Vitals:   11/02/22 1704 11/02/22 1943 11/03/22 0015 11/03/22 0430  BP: 123/79 133/75 125/78 129/72  Pulse: 87 85 84 87  Resp:  18 18 18   Temp: 98.9 F (37.2 C) 98.3 F (36.8 C) 98.9 F (37.2 C) 98.7 F (37.1 C)  TempSrc: Oral Oral Oral Oral  SpO2: 100% 100% 99% 98%  Weight:      Height:       Physical Exam General: well appearing  Heart:RRR Lungs: clear Abdomen: PD effluent with slight haze but much clearer Extremities: no edema  Additional Objective Labs: Basic Metabolic Panel: Recent Labs  Lab 11/01/22 0804 11/02/22 0557  NA 139 134*  K 3.8 3.7  CL 94* 98  CO2 26 23  GLUCOSE 115* 64*  BUN 42* 46*  CREATININE 10.96* 9.98*  CALCIUM 9.1 8.1*  PHOS  --  4.0    Liver Function Tests: Recent Labs  Lab 11/01/22 0804 11/02/22 0557  AST 16  --   ALT 16  --   ALKPHOS 157*  --   BILITOT 0.8  --   PROT 6.7  --   ALBUMIN 3.1* 2.2*    Recent Labs  Lab 11/01/22 0804  LIPASE 31    CBC: Recent Labs  Lab 11/01/22 0804 11/02/22 0557  WBC 8.5 8.6  NEUTROABS 6.7  --   HGB 11.4* 10.0*  HCT 35.1* 31.1*  MCV 90.9 88.1  PLT 328 296    Blood Culture    Component Value Date/Time   SDES PERITONEAL DIALYSATE 11/01/2022 1150   SPECREQUEST Immunocompromised 11/01/2022 1150   CULT  11/01/2022 1150    CULTURE REINCUBATED FOR BETTER GROWTH Performed at [facility], [address] [place], [place] [address]    REPTSTATUS PENDING 11/01/2022 1150    Cardiac Enzymes: No results for input(s): "CKTOTAL", "CKMB", "CKMBINDEX", "TROPONINI" in the last 168 hours. CBG: No results for input(s): "GLUCAP" in the last 168 hours. Iron Studies: No results for input(s): "IRON", "TIBC", "TRANSFERRIN", "FERRITIN" in the last 72  hours. @lablastinr3 @ Studies/Results: CT ABDOMEN PELVIS WO CONTRAST  Result Date: 11/01/2022 CLINICAL DATA:  Midline abdominal pain. History of peritoneal dialysis. EXAM: CT ABDOMEN AND PELVIS WITHOUT CONTRAST TECHNIQUE: Multidetector CT imaging of the abdomen and pelvis was performed following the standard protocol without IV contrast. RADIATION DOSE REDUCTION: This exam was performed according to the departmental dose-optimization program which includes automated exposure control, adjustment of the mA and/or kV according to patient size and/or use of iterative reconstruction technique. COMPARISON:  03/20/2020. FINDINGS: Lower chest: Mild dependent atelectasis at the right lung base. Lung bases otherwise clear. Hepatobiliary: No focal liver abnormality is seen. No gallstones, gallbladder wall thickening, or biliary dilatation. Pancreas: Unremarkable. No pancreatic ductal dilatation or surrounding inflammatory changes. Spleen: Normal in size without focal abnormality. Adrenals/Urinary Tract: Bilateral renal cortical thinning with decreased renal sizes. No renal mass. No collecting system stone. No hydronephrosis. No adrenal masses. Normal ureters. Bladder mostly decompressed, otherwise unremarkable. Stomach/Bowel: Stomach is unremarkable. Small bowel and colon are normal in caliber. No wall thickening. No inflammation. Vascular/Lymphatic: Aorta normal in caliber with minimal atherosclerotic calcifications. More extensive calcification is noted along the aortic branch vessels, significantly increased when compared to the prior CT. No enlarged lymph nodes. Reproductive: 4.2 cm right adnexal/ovarian cystic mass measuring  4.2 cm. No follow-up recommended unless there are symptoms referable to the right adnexa. Other: Trace amount of ascites. Small amount of free intraperitoneal air. These findings are presumed due to the peritoneal dialysis catheter, which curls in the lower pelvis just above the bladder.  Musculoskeletal: Normal. IMPRESSION: 1. No acute findings within the abdomen or pelvis. 2. Findings of chronic renal disease and peritoneal dialysis, including small kidneys with bilateral cortical thinning, peritoneal dialysis catheter in the pelvis, and a trace amount of ascites and a small amount of free intraperitoneal air. 3. Significant aortic branch vessel calcifications that have developed since the previous CT. Electronically Signed   By: Amie Portland M.D.   On: 11/01/2022 11:04   Medications:  cefTRIAXone (ROCEPHIN)  IV 2 g (11/02/22 1214)   dialysis solution 1.5% low-MG/low-CA      amLODipine  10 mg Oral QPM   gentamicin cream  1 Application Topical Daily   heparin  5,000 Units Subcutaneous Q8H   insulin pump   Subcutaneous TID WC, HS, 0200   metoprolol succinate  100 mg Oral BID   sodium chloride flush  3 mL Intravenous Q12H    Dialysis Orders:  PD Rx per patient: 4 exchanges + 1 day exchange;  2L fill volume, 2hr dwell time. Last fill 1.2L.  Will plan for 5 exchanges, no pause in the hospital   Assessment/Plan:  Peritonitis:  cell count 5329 TNC 88% PMN c/w peritonitis.  Noted breach in technique as etiology.  Being treated with IV ceftiaxone and vancomycin while culture pending - remains NGTD interestingly despite high cell count.  Cont broad spectrum for now.  If remains culture neg and clinically improved ok to stop the gram neg coverage.  D/w outpt PD clinic - appt 7/10 2:30pm for repeat cell count, abx and skill check off (home visit today went fine).  Verifying with pharmacy dosing of vanc.  2 weeks of therapy date from 7/8 - end date 11/16/22. Would give a dose of ceftaz 1g tomorrow pending return of repeat cell count. Addendum:  culture with staph epi and hominis --> given the nature of the breach this is plausible to be the pathogen so will omit gram neg coverage and just do the 2 week coverage with vancomycin.  Will notify outpt clinic of vanc dosing --> pharmacy has  recommended 1g q3d IP in last dwell.  First dose can be Thurs.  ESRD on PD:  cont usual tx; warned of possibility of decreased UF around peritonitis episode.   Anemia: Hb 10s, outpt ESA BMM: cont home meds Nutrition: Renal diet.  Type 1 DM: on insulin pump, per primary.   Dispo: ok for d/c today - go to [facility] clinic 7/10 2:30pm for abx   Estill Bakes MD 11/03/2022, 7:25 AM  [facility] Pager: [phone]

## 2022-11-04 DIAGNOSIS — K659 Peritonitis, unspecified: Secondary | ICD-10-CM | POA: Insufficient documentation

## 2022-11-04 LAB — BODY FLUID CULTURE W GRAM STAIN

## 2022-12-10 ENCOUNTER — Other Ambulatory Visit: Payer: Self-pay | Admitting: Nephrology

## 2022-12-10 DIAGNOSIS — K659 Peritonitis, unspecified: Secondary | ICD-10-CM

## 2023-01-01 ENCOUNTER — Ambulatory Visit
Admission: RE | Admit: 2023-01-01 | Discharge: 2023-01-01 | Disposition: A | Discharge status: Home / Self Care | Payer: 59 | Source: Ambulatory Visit | Attending: Nephrology | Admitting: Nephrology

## 2023-01-01 DIAGNOSIS — K659 Peritonitis, unspecified: Secondary | ICD-10-CM

## 2023-01-01 MED ORDER — IOPAMIDOL (ISOVUE-300) INJECTION 61%
500.0000 mL | Freq: Once | INTRAVENOUS | Status: AC | PRN
  Administered 2023-01-01: 100 mL via INTRAVENOUS

## 2023-01-06 ENCOUNTER — Encounter (HOSPITAL_COMMUNITY): Payer: Self-pay | Admitting: Emergency Medicine

## 2023-01-06 ENCOUNTER — Inpatient Hospital Stay (HOSPITAL_COMMUNITY)
Admission: EM | Admit: 2023-01-06 | Discharge: 2023-01-09 | DRG: 867 | Disposition: A | Discharge status: Home / Self Care | Payer: 59 | Attending: Internal Medicine | Admitting: Internal Medicine

## 2023-01-06 ENCOUNTER — Emergency Department (HOSPITAL_COMMUNITY): Payer: 59

## 2023-01-06 ENCOUNTER — Observation Stay (HOSPITAL_COMMUNITY): Payer: 59

## 2023-01-06 ENCOUNTER — Other Ambulatory Visit: Payer: Self-pay

## 2023-01-06 DIAGNOSIS — Z992 Dependence on renal dialysis: Secondary | ICD-10-CM

## 2023-01-06 DIAGNOSIS — N83209 Unspecified ovarian cyst, unspecified side: Secondary | ICD-10-CM | POA: Insufficient documentation

## 2023-01-06 DIAGNOSIS — B957 Other staphylococcus as the cause of diseases classified elsewhere: Secondary | ICD-10-CM | POA: Diagnosis present

## 2023-01-06 DIAGNOSIS — Z9641 Presence of insulin pump (external) (internal): Secondary | ICD-10-CM | POA: Diagnosis present

## 2023-01-06 DIAGNOSIS — Z794 Long term (current) use of insulin: Secondary | ICD-10-CM

## 2023-01-06 DIAGNOSIS — M898X9 Other specified disorders of bone, unspecified site: Secondary | ICD-10-CM | POA: Diagnosis present

## 2023-01-06 DIAGNOSIS — T8029XA Infection following other infusion, transfusion and therapeutic injection, initial encounter: Principal | ICD-10-CM | POA: Diagnosis present

## 2023-01-06 DIAGNOSIS — Z9102 Food additives allergy status: Secondary | ICD-10-CM

## 2023-01-06 DIAGNOSIS — K652 Spontaneous bacterial peritonitis: Secondary | ICD-10-CM | POA: Diagnosis not present

## 2023-01-06 DIAGNOSIS — Z881 Allergy status to other antibiotic agents status: Secondary | ICD-10-CM

## 2023-01-06 DIAGNOSIS — E1022 Type 1 diabetes mellitus with diabetic chronic kidney disease: Secondary | ICD-10-CM | POA: Diagnosis present

## 2023-01-06 DIAGNOSIS — Z885 Allergy status to narcotic agent status: Secondary | ICD-10-CM

## 2023-01-06 DIAGNOSIS — Z8049 Family history of malignant neoplasm of other genital organs: Secondary | ICD-10-CM

## 2023-01-06 DIAGNOSIS — J36 Peritonsillar abscess: Secondary | ICD-10-CM | POA: Diagnosis present

## 2023-01-06 DIAGNOSIS — E876 Hypokalemia: Secondary | ICD-10-CM | POA: Diagnosis present

## 2023-01-06 DIAGNOSIS — N2581 Secondary hyperparathyroidism of renal origin: Secondary | ICD-10-CM | POA: Diagnosis present

## 2023-01-06 DIAGNOSIS — Z8249 Family history of ischemic heart disease and other diseases of the circulatory system: Secondary | ICD-10-CM

## 2023-01-06 DIAGNOSIS — I120 Hypertensive chronic kidney disease with stage 5 chronic kidney disease or end stage renal disease: Secondary | ICD-10-CM | POA: Diagnosis present

## 2023-01-06 DIAGNOSIS — Z888 Allergy status to other drugs, medicaments and biological substances status: Secondary | ICD-10-CM

## 2023-01-06 DIAGNOSIS — T8571XA Infection and inflammatory reaction due to peritoneal dialysis catheter, initial encounter: Secondary | ICD-10-CM

## 2023-01-06 DIAGNOSIS — Z818 Family history of other mental and behavioral disorders: Secondary | ICD-10-CM

## 2023-01-06 DIAGNOSIS — N83202 Unspecified ovarian cyst, left side: Secondary | ICD-10-CM | POA: Diagnosis present

## 2023-01-06 DIAGNOSIS — N186 End stage renal disease: Secondary | ICD-10-CM | POA: Diagnosis present

## 2023-01-06 DIAGNOSIS — F419 Anxiety disorder, unspecified: Secondary | ICD-10-CM | POA: Diagnosis present

## 2023-01-06 DIAGNOSIS — Z79899 Other long term (current) drug therapy: Secondary | ICD-10-CM

## 2023-01-06 DIAGNOSIS — Y841 Kidney dialysis as the cause of abnormal reaction of the patient, or of later complication, without mention of misadventure at the time of the procedure: Secondary | ICD-10-CM | POA: Diagnosis present

## 2023-01-06 DIAGNOSIS — Z7982 Long term (current) use of aspirin: Secondary | ICD-10-CM

## 2023-01-06 DIAGNOSIS — D631 Anemia in chronic kidney disease: Secondary | ICD-10-CM | POA: Diagnosis present

## 2023-01-06 LAB — CBC
HCT: 39.7 % (ref 36.0–46.0)
Hemoglobin: 12.6 g/dL (ref 12.0–15.0)
MCH: 28.8 pg (ref 26.0–34.0)
MCHC: 31.7 g/dL (ref 30.0–36.0)
MCV: 90.8 fL (ref 80.0–100.0)
Platelets: 405 10*3/uL — ABNORMAL HIGH (ref 150–400)
RBC: 4.37 MIL/uL (ref 3.87–5.11)
RDW: 13.2 % (ref 11.5–15.5)
WBC: 11.8 10*3/uL — ABNORMAL HIGH (ref 4.0–10.5)
nRBC: 0 % (ref 0.0–0.2)

## 2023-01-06 LAB — COMPREHENSIVE METABOLIC PANEL
ALT: 22 U/L (ref 0–44)
AST: 26 U/L (ref 15–41)
Albumin: 2.5 g/dL — ABNORMAL LOW (ref 3.5–5.0)
Alkaline Phosphatase: 180 U/L — ABNORMAL HIGH (ref 38–126)
Anion gap: 15 (ref 5–15)
BUN: 33 mg/dL — ABNORMAL HIGH (ref 6–20)
CO2: 25 mmol/L (ref 22–32)
Calcium: 8 mg/dL — ABNORMAL LOW (ref 8.9–10.3)
Chloride: 98 mmol/L (ref 98–111)
Creatinine, Ser: 11.75 mg/dL — ABNORMAL HIGH (ref 0.44–1.00)
GFR, Estimated: 4 mL/min — ABNORMAL LOW (ref >60)
Glucose, Bld: 120 mg/dL — ABNORMAL HIGH (ref 70–99)
Potassium: 2.6 mmol/L — CL (ref 3.5–5.1)
Sodium: 138 mmol/L (ref 135–145)
Total Bilirubin: 0.4 mg/dL (ref 0.3–1.2)
Total Protein: 5.8 g/dL — ABNORMAL LOW (ref 6.5–8.1)

## 2023-01-06 LAB — BODY FLUID CELL COUNT WITH DIFFERENTIAL
Eos, Fluid: 0 %
Lymphs, Fluid: 1 %
Monocyte-Macrophage-Serous Fluid: 13 % — ABNORMAL LOW (ref 50–90)
Neutrophil Count, Fluid: 84 % — ABNORMAL HIGH (ref 0–25)
Total Nucleated Cell Count, Fluid: 10100 uL — ABNORMAL HIGH (ref 0–1000)

## 2023-01-06 LAB — GRAM STAIN

## 2023-01-06 LAB — LIPASE, BLOOD: Lipase: 26 U/L (ref 11–51)

## 2023-01-06 LAB — I-STAT CG4 LACTIC ACID, ED
Lactic Acid, Venous: 1.2 mmol/L (ref 0.5–1.9)
Lactic Acid, Venous: 1.3 mmol/L (ref 0.5–1.9)

## 2023-01-06 LAB — HCG, SERUM, QUALITATIVE: Preg, Serum: NEGATIVE

## 2023-01-06 MED ORDER — GENTAMICIN SULFATE 0.1 % EX CREA
1.0000 | TOPICAL_CREAM | Freq: Every day | CUTANEOUS | Status: DC
  Filled 2023-01-06: qty 15

## 2023-01-06 MED ORDER — HEPARIN SODIUM (PORCINE) 5000 UNIT/ML IJ SOLN
5000.0000 [IU] | Freq: Three times a day (TID) | INTRAMUSCULAR | Status: DC
  Administered 2023-01-06 – 2023-01-09 (×8): 5000 [IU] via SUBCUTANEOUS
  Filled 2023-01-06 (×8): qty 1

## 2023-01-06 MED ORDER — OXYCODONE HCL 5 MG PO TABS: 2.5000 mg | ORAL_TABLET | Freq: Four times a day (QID) | ORAL | Status: DC | PRN

## 2023-01-06 MED ORDER — DELFLEX-LC/1.5% DEXTROSE 344 MOSM/L IP SOLN: INTRAPERITONEAL | Status: DC

## 2023-01-06 MED ORDER — OXYCODONE HCL 5 MG PO TABS
5.0000 mg | ORAL_TABLET | Freq: Four times a day (QID) | ORAL | Status: DC | PRN
  Administered 2023-01-06 – 2023-01-07 (×4): 5 mg via ORAL
  Filled 2023-01-06 (×5): qty 1

## 2023-01-06 MED ORDER — INSULIN PUMP
Freq: Three times a day (TID) | SUBCUTANEOUS | Status: DC
  Administered 2023-01-07: 1.3 via SUBCUTANEOUS
  Administered 2023-01-07: 3.6 via SUBCUTANEOUS
  Administered 2023-01-07: 0.9 via SUBCUTANEOUS
  Administered 2023-01-07: 1.2 via SUBCUTANEOUS
  Administered 2023-01-08: 0.3 via SUBCUTANEOUS
  Filled 2023-01-06: qty 1

## 2023-01-06 MED ORDER — VANCOMYCIN HCL 10 G IV SOLR
1750.0000 mg | Freq: Once | INTRAVENOUS | Status: AC
  Administered 2023-01-06: 1750 mg via INTRAVENOUS
  Filled 2023-01-06: qty 1750

## 2023-01-06 MED ORDER — ACETAMINOPHEN 500 MG PO TABS
1000.0000 mg | ORAL_TABLET | Freq: Four times a day (QID) | ORAL | Status: DC | PRN
  Administered 2023-01-07 – 2023-01-09 (×7): 1000 mg via ORAL
  Filled 2023-01-06 (×7): qty 2

## 2023-01-06 MED ORDER — SODIUM CHLORIDE 0.9 % IV SOLN
600.0000 mg | INTRAVENOUS | Status: DC
  Administered 2023-01-06 – 2023-01-08 (×3): 600 mg via INTRAVENOUS
  Filled 2023-01-06 (×4): qty 10

## 2023-01-06 MED ORDER — POTASSIUM CHLORIDE CRYS ER 20 MEQ PO TBCR
40.0000 meq | EXTENDED_RELEASE_TABLET | Freq: Two times a day (BID) | ORAL | Status: DC
  Administered 2023-01-06: 40 meq via ORAL
  Filled 2023-01-06: qty 2

## 2023-01-06 MED ORDER — CEFTAZIDIME 200 MG/ML FOR DIALYSIS: 1000.0000 mg | INJECTION | Freq: Once | INTRAVENOUS_CENTRAL | Status: DC

## 2023-01-06 MED ORDER — SODIUM CHLORIDE 0.9 % IV SOLN
2.0000 g | INTRAVENOUS | Status: DC
  Administered 2023-01-06 – 2023-01-07 (×2): 2 g via INTRAVENOUS
  Filled 2023-01-06 (×2): qty 20

## 2023-01-06 MED ORDER — VANCOMYCIN VARIABLE DOSE PER UNSTABLE RENAL FUNCTION (PHARMACIST DOSING): Status: DC

## 2023-01-06 MED ORDER — FENTANYL CITRATE PF 50 MCG/ML IJ SOSY
100.0000 ug | PREFILLED_SYRINGE | Freq: Once | INTRAMUSCULAR | Status: AC
  Administered 2023-01-06: 100 ug via INTRAVENOUS
  Filled 2023-01-06: qty 2

## 2023-01-06 MED ORDER — ONDANSETRON HCL 4 MG/2ML IJ SOLN
4.0000 mg | Freq: Four times a day (QID) | INTRAMUSCULAR | Status: DC | PRN
  Administered 2023-01-06 – 2023-01-09 (×6): 4 mg via INTRAVENOUS
  Filled 2023-01-06 (×6): qty 2

## 2023-01-06 NOTE — ED Notes (Signed)
ED TO INPATIENT HANDOFF REPORT  ED Nurse Name and Phone #: Janmichael Giraud and amber, rn  S Name/Age/Gender Kelly Huff 37 y.o. female Room/Bed: 045C/045C  Code Status   Code Status: Full Code  Home/SNF/Other Home Patient oriented to: self, place, time, and situation Is this baseline? Yes   Triage Complete: Triage complete  Chief Complaint SBP (spontaneous bacterial peritonitis) (HCC) [K65.2]  Triage Note Pt arrives via EMS from home with generalized abd pain. Peritoneal dialysis for 3 years. Had full treatment and then began having abd pain. EMS gave 100 mcg fentanyl that improved pain but pt did report itching so 50 mg IV benadryl given.    Allergies Allergies  Allergen Reactions   Morphine Itching and Other (See Comments)    Hallucinations, also   Peanut Oil Swelling and Other (See Comments)    Tongue swelling as a child   Sulfamethoxazole-Trimethoprim Hives and Other (See Comments)   Covid-19 (Mrna) Vaccine Other (See Comments)    Was told to not take this again   Trulicity [Dulaglutide] Other (See Comments)    She is DM Type 1. Was prescribed but damaged her kidneys    Level of Care/Admitting Diagnosis ED Disposition     ED Disposition  Admit   Condition  --   Comment  Hospital Area: [facility] [[id number]]  Level of Care: Med-Surg [16]  May place patient in observation at [facility] or [facility] if equivalent level of care is available:: Yes  Covid Evaluation: Asymptomatic - no recent exposure (last 10 days) testing not required  Diagnosis: SBP (spontaneous bacterial peritonitis) [facility]) [[id number]]  Admitting Physician: Dolly Rias [[id number]]  Attending Physician: Dolly Rias [[id number]]          B Medical/Surgery History Past Medical History:  Diagnosis Date   Acute encephalopathy 09/03/2018   Chronic kidney disease    Diabetes mellitus without complication (HCC)    DKA (diabetic ketoacidosis) (HCC) 03/20/2020   History of  pre-eclampsia 2016   mild   Hypertension    Hypertensive urgency 09/03/2018   Past Surgical History:  Procedure Laterality Date   CESAREAN SECTION     DILATION AND CURETTAGE OF UTERUS     x 4   IR FLUORO GUIDE CV LINE RIGHT  12/05/2018   IR FLUORO GUIDE CV LINE RIGHT  12/13/2018   IR REMOVAL TUN CV CATH W/O FL  02/22/2019   IR US GUIDE VASC ACCESS RIGHT  12/05/2018   IR US GUIDE VASC ACCESS RIGHT  12/13/2018   LAPAROSCOPIC APPENDECTOMY N/A 12/04/2019   Procedure: APPENDECTOMY LAPAROSCOPIC;  Surgeon: Manus Rudd, MD;  Location: [facility] OR;  Service: General;  Laterality: N/A;     A IV Location/Drains/Wounds Patient Lines/Drains/Airways Status     Active Line/Drains/Airways     Name Placement date Placement time Site Days   Peripheral IV 01/06/23 20 G Right Antecubital 01/06/23  1600  Antecubital  less than 1   Incision - 3 Ports Abdomen 1: Umbilicus 2: Medial;Upper 3: Left;Lateral;Lower 12/04/19  2145  -- 1129            Intake/Output Last 24 hours  Intake/Output Summary (Last 24 hours) at 01/06/2023 2114 Last data filed at 01/06/2023 1956 Gross per 24 hour  Intake 100 ml  Output --  Net 100 ml    Labs/Imaging Results for orders placed or performed during the hospital encounter of 01/06/23 (from the past 48 hour(s))  Lipase, blood     Status: None   Collection  Time: 01/06/23  2:18 PM  Result Value Ref Range   Lipase 26 11 - 51 U/L    Comment: Performed at [facility], [address], [place], [place] [address]  Comprehensive metabolic panel     Status: Abnormal   Collection Time: 01/06/23  2:18 PM  Result Value Ref Range   Sodium 138 135 - 145 mmol/L   Potassium 2.6 (LL) 3.5 - 5.1 mmol/L    Comment: CRITICAL RESULT CALLED TO, READ BACK BY AND VERIFIED WITH Lenis Noon, RN @ 1644 01/06/23 BY SEKDAHL   Chloride 98 98 - 111 mmol/L   CO2 25 22 - 32 mmol/L   Glucose, Bld 120 (H) 70 - 99 mg/dL    Comment: Glucose reference range applies only to samples taken after  fasting for at least 8 hours.   BUN 33 (H) 6 - 20 mg/dL   Creatinine, Ser 60.45 (H) 0.44 - 1.00 mg/dL   Calcium 8.0 (L) 8.9 - 10.3 mg/dL   Total Protein 5.8 (L) 6.5 - 8.1 g/dL   Albumin 2.5 (L) 3.5 - 5.0 g/dL   AST 26 15 - 41 U/L   ALT 22 0 - 44 U/L   Alkaline Phosphatase 180 (H) 38 - 126 U/L   Total Bilirubin 0.4 0.3 - 1.2 mg/dL   GFR, Estimated 4 (L) >60 mL/min    Comment: (NOTE) Calculated using the CKD-EPI Creatinine Equation (2021)    Anion gap 15 5 - 15    Comment: Performed at [facility], [address] [place], [place] [address]  CBC     Status: Abnormal   Collection Time: 01/06/23  2:18 PM  Result Value Ref Range   WBC 11.8 (H) 4.0 - 10.5 K/uL   RBC 4.37 3.87 - 5.11 MIL/uL   Hemoglobin 12.6 12.0 - 15.0 g/dL   HCT 19.1 47.8 - 29.5 %   MCV 90.8 80.0 - 100.0 fL   MCH 28.8 26.0 - 34.0 pg   MCHC 31.7 30.0 - 36.0 g/dL   RDW 62.1 30.8 - 65.7 %   Platelets 405 (H) 150 - 400 K/uL   nRBC 0.0 0.0 - 0.2 %    Comment: Performed at [facility], [address], [place], [place] [address]  hCG, serum, qualitative     Status: None   Collection Time: 01/06/23  2:18 PM  Result Value Ref Range   Preg, Serum NEGATIVE NEGATIVE    Comment:        THE SENSITIVITY OF THIS METHODOLOGY IS >10 mIU/mL. Performed at [facility], [address], [place], [place] [address]   Body fluid cell count with differential     Status: Abnormal   Collection Time: 01/06/23  2:53 PM  Result Value Ref Range   Fluid Type-FCT PERITONEAL DIALYSIS     Comment: CORRECTED ON 09/11 AT 1730: PREVIOUSLY REPORTED AS Peritoneal   Color, Fluid MILKY (A) YELLOW   Appearance, Fluid CLOUDY (A) CLEAR   Total Nucleated Cell Count, Fluid 10,100 (H) 0 - 1,000 cu mm   Neutrophil Count, Fluid 84 (H) 0 - 25 %   Lymphs, Fluid 1 %   Monocyte-Macrophage-Serous Fluid 13 (L) 50 - 90 %   Eos, Fluid 0 %   Other Cells, Fluid TWO BASOPHILS SEEN %    Comment: Performed at [facility], [address], [place], [place] [address]  Gram stain     Status: None   Collection Time:  01/06/23  2:53 PM   Specimen: Fluid  Result Value Ref Range   Specimen Description FLUID PERITONEAL DIALYSIS    Special Requests NONE    Gram Stain      MODERATE WBC PRESENT, PREDOMINANTLY PMN RARE INTRACELLULAR GRAM POSITIVE COCCI CRITICAL RESULT CALLED TO, READ BACK BY AND VERIFIED WITH: RN NADIA PATTERSON ON 01/06/23 @ 1951 BY DRT Performed at [facility], [address], [place], [place] [address]    Report Status 01/06/2023 FINAL   I-Stat CG4 Lactic Acid     Status: None   Collection Time: 01/06/23  4:22 PM  Result Value Ref Range   Lactic Acid, Venous 1.2 0.5 - 1.9 mmol/L  I-Stat CG4 Lactic Acid     Status: None   Collection Time: 01/06/23  8:47 PM  Result Value Ref Range   Lactic Acid, Venous 1.3 0.5 - 1.9 mmol/L   CT ABDOMEN PELVIS WO CONTRAST  Result Date: 01/06/2023 CLINICAL DATA:  Generalized abdominal pain. Peritoneal dialysis patient. EXAM: CT ABDOMEN AND PELVIS WITHOUT CONTRAST TECHNIQUE: Multidetector CT imaging of the abdomen and pelvis was performed following the standard protocol without IV contrast. RADIATION DOSE REDUCTION: This exam was performed according to the departmental dose-optimization program which includes automated exposure control, adjustment of the mA and/or kV according to patient size and/or use of iterative reconstruction technique. COMPARISON:  CT 01/01/2023 and older FINDINGS: Lower chest: There is some linear opacity lung bases likely scar or atelectasis. No pleural effusion. Coronary artery calcifications are seen. Hepatobiliary: On this non IV contrast exam, grossly preserved hepatic parenchyma. There is high density material in the distended gallbladder. This could be vicarious excretion of previous contrast material. Pancreas: Mild atrophy of the pancreas. Spleen: Normal in size without focal abnormality. Adrenals/Urinary Tract: The adrenal glands are preserved. Severe  atrophy of the kidneys. The calcifications along the kidneys may be more vascular than collecting system. No collecting system dilatation. The ureters have normal course and caliber extending down to the bladder. There is some high attenuation material in the bladder. Please correlate previous contrast. The bladder is underdistended. Stomach/Bowel: On this non oral contrast exam the large bowel has a normal course and caliber. There are areas of wall thickening along the colon particularly along the ascending colon. Mild along the transverse colon. This has new from previous. Please correlate for developing colitis. Surgical changes in the area of the base of the cecum in the appendix is not well seen. Stomach has some luminal fluid and is nondilated. Small bowel is nondilated. Question some areas of small-bowel wall thickening. Vascular/Lymphatic: Diffuse vascular calcifications are identified. Normal caliber aorta and IVC. No discrete abnormal lymph node enlargement identified in the abdomen and pelvis. Reproductive: Uterus is present. There is new cystic lesion in the left adnexa measuring 4.0 x 3.1 cm. This was much smaller 5 days ago measuring 18 mm. Likely a functional cyst with the time course. However please correlate with any particular symptoms and if there is concern of torsion recommend additional workup with ultrasound. Other: Peritoneal dialysis catheter in place. Increasing free air and fluid. Free air would has a differential but could be related to the peritoneal dialysis. Bowel perforation would be difficult to exclude in principle Musculoskeletal: There is diffuse sclerosis and trabecular thickening of the left hemipelvis. Unchanged from previous. Please correlate for any history including Paget's disease. IMPRESSION: Scattered free air and free fluid increased from the study of January 01 2023, however the patient is with a peritoneal  dialysis catheter. Developing 4 cm left adnexal cystic  structure, likely ovarian. With the time course favor a functional cyst. Please correlate with any particular symptoms and if there is concern of torsion a follow up ultrasound may be useful as the next step. Wall thickening along the right side of the colon. Questionable areas of thickening along the small bowel. Please correlate for any clinical evidence of a colitis or enteritis. Atrophic native kidneys.  Diffuse vascular calcifications. Electronically Signed   By: Karen Kays M.D.   On: 01/06/2023 17:47    Pending Labs Unresulted Labs (From admission, onward)     Start     Ordered   01/07/23 0500  Basic metabolic panel  Tomorrow morning,   R        01/06/23 2007   01/07/23 0500  CBC  Tomorrow morning,   R        01/06/23 2007   01/07/23 0500  Magnesium  Tomorrow morning,   R        01/06/23 2007   01/07/23 0500  Phosphorus  Tomorrow morning,   R        01/06/23 2007   01/06/23 1453  Culture, body fluid w Gram Stain-bottle  Once,   R        01/06/23 1453   01/06/23 1453  Pathologist smear review  Once,   R        01/06/23 1453            Vitals/Pain Today's Vitals   01/06/23 1900 01/06/23 2015 01/06/23 2020 01/06/23 2030  BP: 119/77 129/87  (!) 149/87  Pulse: 86 93  83  Resp: 18 (!) 23  19  Temp:   98.5 F (36.9 C)   TempSrc:   Oral   SpO2: 100% 100%  97%  Weight:      Height:      PainSc:        Isolation Precautions No active isolations  Medications Medications  gentamicin cream (GARAMYCIN) 0.1 % 1 Application (0 Applications Topical Hold 01/06/23 2002)  dialysis solution 1.5% low-MG/low-CA dianeal solution (0 mLs Intraperitoneal Hold 01/06/23 2002)  vancomycin (VANCOCIN) 1,750 mg in sodium chloride 0.9 % 500 mL IVPB (1,750 mg Intravenous New Bag/Given 01/06/23 1957)  vancomycin variable dose per unstable renal function (pharmacist dosing) (has no administration in time range)  rifampin (RIFADIN) 600 mg in sodium chloride 0.9 % 100 mL IVPB (0 mg Intravenous Stopped  01/06/23 1956)  potassium chloride SA (KLOR-CON M) CR tablet 40 mEq (has no administration in time range)  heparin injection 5,000 Units (has no administration in time range)  acetaminophen (TYLENOL) tablet 1,000 mg (has no administration in time range)  oxyCODONE (Oxy IR/ROXICODONE) immediate release tablet 2.5 mg ( Oral See Alternative 01/06/23 2041)    Or  oxyCODONE (Oxy IR/ROXICODONE) immediate release tablet 5 mg (5 mg Oral Given 01/06/23 2041)  insulin pump ( Subcutaneous Patient Refused/Not Given 01/06/23 2111)  cefTRIAXone (ROCEPHIN) 2 g in sodium chloride 0.9 % 100 mL IVPB (0 g Intravenous Stopped 01/06/23 2111)  ondansetron (ZOFRAN) injection 4 mg (4 mg Intravenous Given 01/06/23 2042)  fentaNYL (SUBLIMAZE) injection 100 mcg (100 mcg Intravenous Given 01/06/23 1516)    Mobility walks with person assist     Focused Assessments Neuro Assessment Handoff:  Swallow screen pass? Yes          Neuro Assessment:   Neuro Checks:      Has TPA been given? No If patient is a  Neuro Trauma and patient is going to OR before floor call report to 4N Charge nurse: [phone] or [phone]   R Recommendations: See Admitting Provider Note  Report given to:   Additional Notes: pt AAOx4. Pt on room air.

## 2023-01-06 NOTE — H&P (Signed)
History and Physical    Patient: Kelly Huff YSA:[id number] DOB: [birth year] DOA: 01/06/2023 DOS: the patient was seen and examined on 01/06/2023 PCP: Alain Marion, MD  Patient coming from: Home  Chief Complaint:  Chief Complaint  Patient presents with   Abdominal Pain   HPI: Kelly Huff is a 37 y.o. 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 Recently finished course of peritoneal vancomycin weeks ago. Had onset of acute abd pain x 1 day after finishing PD session. Effluent previously reported as clear at home, but noted cloudy effluent here. Pain is generalized, severe. No other nausea, vomiting, diarrhea, urinary changes. Hx of amennorhea x years. Is not sexually active. Discussed finding of ovarian cyst.    Review of Systems:  Complete and neg except as above    Past Medical History:  Diagnosis Date   Acute encephalopathy 09/03/2018   Chronic kidney disease    Diabetes mellitus without complication (HCC)    DKA (diabetic ketoacidosis) (HCC) 03/20/2020   History of pre-eclampsia 2016   mild   Hypertension    Hypertensive urgency 09/03/2018   Past Surgical History:  Procedure Laterality Date   CESAREAN SECTION     DILATION AND CURETTAGE OF UTERUS     x 4   IR FLUORO GUIDE CV LINE RIGHT  12/05/2018   IR FLUORO GUIDE CV LINE RIGHT  12/13/2018   IR REMOVAL TUN CV CATH W/O FL  02/22/2019   IR US GUIDE VASC ACCESS RIGHT  12/05/2018   IR US GUIDE VASC ACCESS RIGHT  12/13/2018   LAPAROSCOPIC APPENDECTOMY N/A 12/04/2019   Procedure: APPENDECTOMY LAPAROSCOPIC;  Surgeon: Manus Rudd, MD;  Location: [facility] OR;  Service: General;  Laterality: N/A;   Social History:  reports that she has never smoked. She has never used smokeless tobacco. She reports that she does not drink alcohol and does not use drugs.  Allergies  Allergen Reactions   Morphine Itching  and Other (See Comments)    Hallucinations, also   Peanut Oil Swelling and Other (See Comments)    Tongue swelling as a child   Sulfamethoxazole-Trimethoprim Hives and Other (See Comments)   Covid-19 (Mrna) Vaccine Other (See Comments)    Was told to not take this again   Trulicity [Dulaglutide] Other (See Comments)    She is DM Type 1. Was prescribed but damaged her kidneys    Family History  Problem Relation Age of Onset   Hypertension Mother    Cancer Mother        Uterine vs cervical (pt unsure),    Schizophrenia Brother    Breast cancer Neg Hx     Prior to Admission medications   Medication Sig Start Date End Date Taking? Authorizing Provider  acetaminophen (TYLENOL) 325 MG tablet Take 2 tablets (650 mg total) by mouth every 4 (four) hours as needed for mild pain or fever. 12/05/19  Yes Meuth, Brooke A, PA-C  amLODipine (NORVASC) 10 MG tablet Take 10 mg by mouth in the morning. 11/06/18  Yes [provider]  aspirin EC 81 MG tablet Take 81 mg by mouth daily. Swallow whole.   Yes [provider]  AURYXIA 1 GM 210 MG(Fe) tablet Take 210-630 mg by mouth See admin instructions. Take 630 mg by mouth three times a day with meals and 210 mg with each snack   Yes [provider]  B Complex-C-Folic Acid (NEPHRO-VITE PO) Take 1 tablet by mouth 3 (three) times a week.   Yes [provider]  clindamycin-benzoyl peroxide (BENZACLIN) gel Apply 1 Application topically in the morning.   Yes [provider]  cyclobenzaprine (FLEXERIL) 10 MG tablet Take 10 mg by mouth at bedtime as needed for muscle spasms.    Yes [provider]  insulin glargine (LANTUS SOLOSTAR) 100 UNIT/ML Solostar Pen Inject into the skin See admin instructions. Use as directed for insulin pump failure 02/05/20  Yes [provider]  insulin lispro (HUMALOG) 100 UNIT/ML injection Continue use of insulin pump with setting as previous: Restart in the morning of  03/23/2020 as Lantus was given on the morning of 03/22/2020. 03/22/20  Yes Swayze, Ava, DO  medroxyPROGESTERone (PROVERA) 10 MG tablet Take 10 mg by mouth See admin instructions. Take 10 mg by mouth on days 1-10 of each month 09/28/22  Yes [provider]  metoprolol succinate (TOPROL-XL) 100 MG 24 hr tablet Take 100 mg by mouth daily. Take with or immediately following a meal.   Yes [provider]  tretinoin (RETIN-A) 0.05 % cream Apply 1 application  topically See admin instructions. Apply as directed daily- affected areas 09/14/22  Yes [provider]  Continuous Glucose Sensor (DEXCOM G6 SENSOR) MISC Inject 1 Device into the skin See admin instructions. Place 1 new device into the skin every 10 days    [provider]  Insulin Disposable Pump (OMNIPOD 5 G6 PODS, GEN 5,) MISC Inject 1 Device into the skin every 3 (three) days.    [provider]  traZODone (DESYREL) 50 MG tablet Take 50 mg by mouth at bedtime as needed for sleep.    [provider]    Physical Exam: Vitals:   01/06/23 1900 01/06/23 2015 01/06/23 2020 01/06/23 2030  BP: 119/77 129/87  (!) 149/87  Pulse: 86 93  83  Resp: 18 (!) 23  19  Temp:   98.5 F (36.9 C)   TempSrc:   Oral   SpO2: 100% 100%  97%  Weight:      Height:        Gen: Awake, alert, NAD  CV: Regular, normal S1, S2, no murmurs  Resp: Normal WOB, CTAB  Abd: PD catheter site without skin changes or discharge, flat, hypoactive, moderate diffuse tenderness, no rebound guarding, or rigidity.  MSK: Symmetric, no edema  Skin: No rashes or lesions to exposed skin  Neuro: Alert and interactive  Psych: euthymic, appropriate   Data Reviewed:   Reviewed labs, notable forL  K 2.6,  Cr c/w ESRD  Alk phos 180 WBC 11   Imaging reviewed  Narrative & Impression  CLINICAL DATA:  Generalized abdominal pain. Peritoneal dialysis patient.   EXAM: CT ABDOMEN AND PELVIS WITHOUT CONTRAST    TECHNIQUE: Multidetector CT imaging of the abdomen and pelvis was performed following the standard protocol without IV contrast.   RADIATION DOSE REDUCTION: This exam was performed according to the departmental dose-optimization program which includes automated exposure control, adjustment of the mA and/or kV according to patient size and/or use of iterative reconstruction technique.   COMPARISON:  CT 01/01/2023 and older   FINDINGS: Lower chest: There is some linear opacity lung bases likely scar or atelectasis. No pleural effusion. Coronary artery calcifications are seen.   Hepatobiliary: On this non IV contrast exam, grossly preserved hepatic parenchyma. There is high density material in the distended gallbladder. This could be vicarious excretion of  previous contrast material.   Pancreas: Mild atrophy of the pancreas.   Spleen: Normal in size without focal abnormality.   Adrenals/Urinary Tract: The adrenal glands are preserved. Severe atrophy of the kidneys. The calcifications along the kidneys may be more vascular than collecting system. No collecting system dilatation. The ureters have normal course and caliber extending down to the bladder. There is some high attenuation material in the bladder. Please correlate previous contrast. The bladder is underdistended.   Stomach/Bowel: On this non oral contrast exam the large bowel has a normal course and caliber. There are areas of wall thickening along the colon particularly along the ascending colon. Mild along the transverse colon. This has new from previous. Please correlate for developing colitis. Surgical changes in the area of the base of the cecum in the appendix is not well seen. Stomach has some luminal fluid and is nondilated. Small bowel is nondilated. Question some areas of small-bowel wall thickening.   Vascular/Lymphatic: Diffuse vascular calcifications are identified. Normal caliber aorta and IVC. No  discrete abnormal lymph node enlargement identified in the abdomen and pelvis.   Reproductive: Uterus is present. There is new cystic lesion in the left adnexa measuring 4.0 x 3.1 cm. This was much smaller 5 days ago measuring 18 mm. Likely a functional cyst with the time course. However please correlate with any particular symptoms and if there is concern of torsion recommend additional workup with ultrasound.   Other: Peritoneal dialysis catheter in place. Increasing free air and fluid. Free air would has a differential but could be related to the peritoneal dialysis. Bowel perforation would be difficult to exclude in principle   Musculoskeletal: There is diffuse sclerosis and trabecular thickening of the left hemipelvis. Unchanged from previous. Please correlate for any history including Paget's disease.   IMPRESSION: Scattered free air and free fluid increased from the study of January 01 2023, however the patient is with a peritoneal dialysis catheter.   Developing 4 cm left adnexal cystic structure, likely ovarian. With the time course favor a functional cyst. Please correlate with any particular symptoms and if there is concern of torsion a follow up ultrasound may be useful as the next step.   Wall thickening along the right side of the colon. Questionable areas of thickening along the small bowel. Please correlate for any clinical evidence of a colitis or enteritis.   Atrophic native kidneys.  Diffuse vascular calcifications.    Assessment and Plan:  37 y.o. 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   PD peritonitis  Hx peritonitis with previous Cx 7/'24 with staph epi, b lactam resistance. Treated with intraperitoneal vancomycin.  - Repeat peritoneal Cx, gram stain showing PMN and  ? Intracellular GPC  - Nephrology was consulted by ED, recommending broad abx coverage for now  - Continue Vancomycin, and Rifampin IV; add Ceftriaxone IV  - Once Cx results and remains stable can transition to peritoneal abx administration   ESRD on PD  Hypokalemia, asymptomatic, treating  - Nephrology to manage PD - Potassium repletion   Ovarian cyst  Incidental finding on CT, enlarging from previous. Serum hcg neg.  - Transvaginal US to further evaluate   Hypertension  - Hold home Metoprolol and Amlodipine as BP borderline low   Type 1 DM  - Patient to utilize their own  insulin pump, order set entered       Advance Care Planning:   Code Status: Full Code ; discussed with patient   Consults: Nephrology   Family Communication: N/a   Severity of Illness: The appropriate patient status for this patient is OBSERVATION. Observation status is judged to be reasonable and necessary in order to provide the required intensity of service to ensure the patient's safety. The patient's presenting symptoms, physical exam findings, and initial radiographic and laboratory data in the context of their medical condition is felt to place them at decreased risk for further clinical deterioration. Furthermore, it is anticipated that the patient will be medically stable for discharge from the hospital within 2 midnights of admission.   Author: Dolly Rias, MD 01/06/2023 9:57 PM  For on call review [email or web address].

## 2023-01-06 NOTE — Progress Notes (Signed)
Pharmacy Antibiotic Note  Kelly Huff is a 37 y.o. female admitted on 01/06/2023 presenting with .  Pharmacy has been consulted for vancomycin and rifampin dosing.  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  Plan: Vancomycin 1750 mg IV x 1, then vancomycin levels to dose Rifampin 600 mg IV once daily Monitor PD therapy, Cx and clinical progression to narrow Vancomycin random level in 48 hours to dictate further dosing  Height: 5\' 7"  (170.2 cm) Weight: 76 kg (167 lb 8.8 oz) IBW/kg (Calculated) : 61.6  Temp (24hrs), Avg:98.3 F (36.8 C), Min:98.3 F (36.8 C), Max:98.3 F (36.8 C)  Recent Labs  Lab 01/06/23 1418 01/06/23 1622  WBC 11.8*  --   LATICACIDVEN  --  1.2    CrCl cannot be calculated (Patient's most recent lab result is older than the maximum 21 days allowed.).    Allergies  Allergen Reactions   Morphine Itching and Other (See Comments)    Hallucinations, also   Peanut Oil Swelling and Other (See Comments)    Tongue swelling as a child   Sulfamethoxazole-Trimethoprim Hives and Other (See Comments)   Covid-19 (Mrna) Vaccine Other (See Comments)    Was told to not take this again   Trulicity [Dulaglutide] Other (See Comments)    She is DM Type 1. Was prescribed but damaged her kidneys    Daylene Posey, PharmD, [facility] Clinical Pharmacist ED Pharmacist Phone # [phone] 01/06/2023 4:44 PM

## 2023-01-06 NOTE — Consult Note (Signed)
[facility] Renal Consultation Note    Indication for Consultation:  Management of ESRD/hemodialysis, anemia, hypertension/volume, and secondary hyperparathyroidism.  HPI: Kelly Huff is a 37 y.o. female with PMH including ESRD on dialysis, T1DM, and HTN who presents with abdominal pain. Patient reports she was hospitalized with peritonitis, cultures grew staph epi and hominis and she reports she was taking ancef at discharge. Reports she completed her IP antibiotics at discharge and was feeling well. She had repeat PD cultures 12/07/22 which showed coagulase negative strep, sensitive to vancomycin. WBC count was 45. She reports she completed another week of IP vancomycin. Reports she did not have any abdominal pain during this time. Reports she has been having some crampy abdominal pain over the past several days which she thought was due to gas. Today, she paused her PD treatment to take her kids to school and noticed abdominal pain when she disconnected. She restarted her treatment when she got home and pain improved, but worsened again after she disconnected at the end of the cycle. She reports she has been very compliant with her antibiotics and sterile technique. She denies fever, chills, nausea, vomiting, constipation, and diarrhea. No redness/drainage from PD cath exit site. Reports her PD effluent has looked clear to her. She thinks she is still getting good UF and denies any SOB, edema, orthopnea, CP, palpitations, dizziness.   Past Medical History:  Diagnosis Date   Acute encephalopathy 09/03/2018   Chronic kidney disease    Diabetes mellitus without complication (HCC)    DKA (diabetic ketoacidosis) (HCC) 03/20/2020   History of pre-eclampsia 2016   mild   Hypertension    Hypertensive urgency 09/03/2018   Past Surgical History:  Procedure Laterality Date   CESAREAN SECTION     DILATION AND CURETTAGE OF UTERUS     x 4   IR FLUORO GUIDE CV LINE RIGHT  12/05/2018   IR  FLUORO GUIDE CV LINE RIGHT  12/13/2018   IR REMOVAL TUN CV CATH W/O FL  02/22/2019   IR US GUIDE VASC ACCESS RIGHT  12/05/2018   IR US GUIDE VASC ACCESS RIGHT  12/13/2018   LAPAROSCOPIC APPENDECTOMY N/A 12/04/2019   Procedure: APPENDECTOMY LAPAROSCOPIC;  Surgeon: Manus Rudd, MD;  Location: [facility] OR;  Service: General;  Laterality: N/A;   Family History  Problem Relation Age of Onset   Hypertension Mother    Cancer Mother        Uterine vs cervical (pt unsure),    Schizophrenia Brother    Breast cancer Neg Hx    Social History:  reports that she has never smoked. She has never used smokeless tobacco. She reports that she does not drink alcohol and does not use drugs.  ROS: As per HPI otherwise negative.   Physical Exam: Vitals:   01/06/23 1412 01/06/23 1415 01/06/23 1416  BP: 131/73    Pulse: (!) 104    Resp: 16    Temp: 98.3 F (36.8 C)    TempSrc: Oral    SpO2: 100% 99%   Weight:   76 kg  Height:   5\' 7"  (1.702 m)     General: Well developed, well nourished, in no acute distress. Head: Normocephalic, atraumatic, sclera non-icteric, mucus membranes are moist. Lungs: Clear bilaterally to auscultation without wheezes, rales, or rhonchi. Breathing is unlabored on RA. Heart: RRR with normal S1, S2. No murmurs, rubs, or gallops appreciated. Abdomen: Soft, non-distended, +BS, diffusely tender to light palpation Musculoskeletal:  Strength and tone appear normal  for age. Lower extremities: No edema or ischemic changes, no open wounds. Neuro: Alert and oriented X 3. Moves all extremities spontaneously. Psych:  Responds to questions appropriately with a normal affect. Dialysis Access: PD cath in lower abdomen, no visible drainage  Allergies  Allergen Reactions   Morphine Itching and Other (See Comments)    Hallucinations, also   Peanut Oil Swelling and Other (See Comments)    Tongue swelling as a child   Sulfamethoxazole-Trimethoprim Hives and Other (See Comments)   Covid-19  (Mrna) Vaccine Other (See Comments)    Was told to not take this again   Trulicity [Dulaglutide] Other (See Comments)    She is DM Type 1. Was prescribed but damaged her kidneys   Prior to Admission medications   Medication Sig Start Date End Date Taking? Authorizing Provider  acetaminophen (TYLENOL) 325 MG tablet Take 2 tablets (650 mg total) by mouth every 4 (four) hours as needed for mild pain or fever. 12/05/19   Meuth, Brooke A, PA-C  amLODipine (NORVASC) 10 MG tablet Take 10 mg by mouth in the morning. 11/06/18   [provider]  aspirin EC 81 MG tablet Take 81 mg by mouth daily. Swallow whole.    [provider]  AURYXIA 1 GM 210 MG(Fe) tablet Take 210-630 mg by mouth See admin instructions. Take 630 mg by mouth three times a day with meals and 210 mg with each snack    [provider]  B Complex-C-Folic Acid (NEPHRO-VITE PO) Take 1 tablet by mouth 3 (three) times a week.    [provider]  clindamycin-benzoyl peroxide (BENZACLIN) gel Apply 1 Application topically in the morning.    [provider]  Continuous Glucose Sensor (DEXCOM G6 SENSOR) MISC Inject 1 Device into the skin See admin instructions. Place 1 new device into the skin every 10 days    [provider]  cyclobenzaprine (FLEXERIL) 10 MG tablet Take 10 mg by mouth at bedtime as needed for muscle spasms.     [provider]  HUMALOG 100 UNIT/ML injection Inject into the skin See admin instructions. Per pump every 3 days- continuous    [provider]  Insulin Disposable Pump (OMNIPOD 5 G6 PODS, GEN 5,) MISC Inject 1 Device into the skin every 3 (three) days.    [provider]  insulin glargine (LANTUS SOLOSTAR) 100 UNIT/ML Solostar Pen Inject into the skin See admin instructions. Use as directed for insulin pump failure 02/05/20   [provider]  insulin lispro (HUMALOG) 100 UNIT/ML injection Continue use of insulin pump with setting as  previous: Restart in the morning of 03/23/2020 as Lantus was given on the morning of 03/22/2020. Patient not taking: Reported on 11/01/2022 03/22/20   Swayze, Ava, DO  medroxyPROGESTERone (PROVERA) 10 MG tablet Take 10 mg by mouth See admin instructions. Take 10 mg by mouth on days 1-10 of each month 09/28/22   [provider]  metoprolol succinate (TOPROL-XL) 50 MG 24 hr tablet Take 100 mg by mouth daily. Take with or immediately following a meal.    [provider]  sodium bicarbonate 650 MG tablet Take 1 tablet (650 mg total) by mouth 3 (three) times daily. Patient not taking: Reported on 11/01/2022 03/22/20   Swayze, Ava, DO  traZODone (DESYREL) 50 MG tablet Take 50 mg by mouth at bedtime as needed for sleep.    [provider]  tretinoin (RETIN-A) 0.05 % cream Apply 1 application  topically See admin instructions.  Apply as directed daily- affected areas 09/14/22   [provider]   No current facility-administered medications for this encounter.   Current Outpatient Medications  Medication Sig Dispense Refill   acetaminophen (TYLENOL) 325 MG tablet Take 2 tablets (650 mg total) by mouth every 4 (four) hours as needed for mild pain or fever.     amLODipine (NORVASC) 10 MG tablet Take 10 mg by mouth in the morning.     aspirin EC 81 MG tablet Take 81 mg by mouth daily. Swallow whole.     AURYXIA 1 GM 210 MG(Fe) tablet Take 210-630 mg by mouth See admin instructions. Take 630 mg by mouth three times a day with meals and 210 mg with each snack     B Complex-C-Folic Acid (NEPHRO-VITE PO) Take 1 tablet by mouth 3 (three) times a week.     clindamycin-benzoyl peroxide (BENZACLIN) gel Apply 1 Application topically in the morning.     Continuous Glucose Sensor (DEXCOM G6 SENSOR) MISC Inject 1 Device into the skin See admin instructions. Place 1 new device into the skin every 10 days     cyclobenzaprine (FLEXERIL) 10 MG tablet Take 10 mg by mouth at bedtime as needed for  muscle spasms.      HUMALOG 100 UNIT/ML injection Inject into the skin See admin instructions. Per pump every 3 days- continuous     Insulin Disposable Pump (OMNIPOD 5 G6 PODS, GEN 5,) MISC Inject 1 Device into the skin every 3 (three) days.     insulin glargine (LANTUS SOLOSTAR) 100 UNIT/ML Solostar Pen Inject into the skin See admin instructions. Use as directed for insulin pump failure     insulin lispro (HUMALOG) 100 UNIT/ML injection Continue use of insulin pump with setting as previous: Restart in the morning of 03/23/2020 as Lantus was given on the morning of 03/22/2020. (Patient not taking: Reported on 11/01/2022) 10 mL 11   medroxyPROGESTERone (PROVERA) 10 MG tablet Take 10 mg by mouth See admin instructions. Take 10 mg by mouth on days 1-10 of each month     metoprolol succinate (TOPROL-XL) 50 MG 24 hr tablet Take 100 mg by mouth daily. Take with or immediately following a meal.     sodium bicarbonate 650 MG tablet Take 1 tablet (650 mg total) by mouth 3 (three) times daily. (Patient not taking: Reported on 11/01/2022) 120 tablet 0   traZODone (DESYREL) 50 MG tablet Take 50 mg by mouth at bedtime as needed for sleep.     tretinoin (RETIN-A) 0.05 % cream Apply 1 application  topically See admin instructions. Apply as directed daily- affected areas     Facility-Administered Medications Ordered in Other Encounters  Medication Dose Route Frequency Provider Last Rate Last Admin   sodium chloride flush (NS) 0.9 % injection 10 mL  10 mL Intravenous PRN Earna Coder, MD       Labs: Basic Metabolic Panel: No results for input(s): "NA", "K", "CL", "CO2", "GLUCOSE", "BUN", "CREATININE", "CALCIUM", "PHOS" in the last 168 hours.  Invalid input(s): "ALB" Liver Function Tests: No results for input(s): "AST", "ALT", "ALKPHOS", "BILITOT", "PROT", "ALBUMIN" in the last 168 hours. No results for input(s): "LIPASE", "AMYLASE" in the last 168 hours. No results for input(s): "AMMONIA" in the last 168  hours. CBC: No results for input(s): "WBC", "NEUTROABS", "HGB", "HCT", "MCV", "PLT" in the last 168 hours. Cardiac Enzymes: No results for input(s): "CKTOTAL", "CKMB", "CKMBINDEX", "TROPONINI" in the last 168 hours. CBG: No results for input(s): "GLUCAP" in the  last 168 hours. Iron Studies: No results for input(s): "IRON", "TIBC", "TRANSFERRIN", "FERRITIN" in the last 72 hours. Studies/Results: No results found.  Dialysis Orders:  7X Week, EDW 72 (kg) Ca 2.5 (mEq/L) Mg 0.5 (mEq/L), Dextrose 1.5; 2.5; 4.25 %, # Exchanges 5, fill vol 2450 mL, last fill vol 1200 mL  Assessment/Plan:  Recurrent peritonitis: RN reports grossly abnormal PD effluent drawn for cell count and culture. Culture on 12/07/22 outpatient grew coagulase negative staph. She reports she completed 1 week of IP vancomycin. Likely needs longer antibiotic course. Will add rifampin (last culture was susceptible). Consulting pharmacy for antibiotic dosing. Likely needs 3 week course. If infection still does not clear, may need PD cath removed.   ESRD:  On PD. Usually does 4 exchanges and a pause, will do 5 exchanges overnight here, no pause.   Hypertension/volume: BP running high outpatient, controlled here, euvolemic on exam. Appears she was using all 1.5% dextrose recently, will continue today, will adjust as needed based on BP/volume status  Anemia: Hgb 12.6. No ESA indicated  Metabolic bone disease: Calcium and phos controlled, continue current medications  Nutrition:  Alb 2.2, continue renal diet and protein supplements  Rogers Blocker, PA-C 01/06/2023, 3:42 PM  [facility] Pager: [phone]

## 2023-01-06 NOTE — ED Notes (Signed)
Critical: rear intracellular gram positive cocci, daniell

## 2023-01-06 NOTE — ED Triage Notes (Signed)
Pt arrives via EMS from home with generalized abd pain. Peritoneal dialysis for 3 years. Had full treatment and then began having abd pain. EMS gave 100 mcg fentanyl that improved pain but pt did report itching so 50 mg IV benadryl given.

## 2023-01-06 NOTE — ED Notes (Signed)
Pt signed insulin pump consent.

## 2023-01-06 NOTE — ED Notes (Signed)
Requested pain medications

## 2023-01-06 NOTE — ED Notes (Signed)
HD nurse at beside for fluid collection

## 2023-01-06 NOTE — ED Provider Notes (Signed)
Received handoff disposition pending labs CT scan and nephro evaluation.  Patient reports pain currently under control.  Asking to eat. Physical Exam  BP 97/73 (BP Location: Right Arm)   Pulse 87   Temp 98 F (36.7 C) (Oral)   Resp 17   Ht 5\' 7"  (1.702 m)   Wt 76 kg   SpO2 100%   BMI 26.24 kg/m   Physical Exam Vitals and nursing note reviewed.  HENT:     Head: Normocephalic.  Cardiovascular:     Rate and Rhythm: Normal rate.  Abdominal:     Palpations: Abdomen is soft.     Tenderness: There is generalized abdominal tenderness. There is no guarding or rebound.  Skin:    General: Skin is warm and dry.     Capillary Refill: Capillary refill takes less than 2 seconds.  Neurological:     Mental Status: She is alert and oriented to person, place, and time.  Psychiatric:        Mood and Affect: Mood normal.        Behavior: Behavior normal.     Procedures  Procedures  ED Course / MDM   Clinical Course as of 01/06/23 1836  Wed Jan 06, 2023  1703 CBC(!) Leukocytosis noted [TY]  1703 Lactic Acid, Venous: 1.2 Overt sepsis unlikely [TY]  1804 CT ABDOMEN PELVIS WO CONTRAST IMPRESSION: Scattered free air and free fluid increased from the study of January 01 2023, however the patient is with a peritoneal dialysis catheter.  Developing 4 cm left adnexal cystic structure, likely ovarian. With the time course favor a functional cyst. Please correlate with any particular symptoms and if there is concern of torsion a follow up ultrasound may be useful as the next step.  Wall thickening along the right side of the colon. Questionable areas of thickening along the small bowel. Please correlate for any clinical evidence of a colitis or enteritis.  Atrophic native kidneys.  Diffuse vascular calcifications.   Electronically Signed   [TY]    Clinical Course User Index [TY] Coral Spikes, DO   Medical Decision Making 37 year old female with peritoneal dialysis with  sudden onset abdominal pain with recent history of SBP.  Mildly tachycardic, although afebrile.  Does have leukocytosis.  Nephrology evaluated recommending broad-spectrum antibiotic and admission.  CT scan reviewed; overall reassuring with no acute surgical pathology.  It does mention slightly enlarged ovary, however patient is having no localizing tenderness to pelvis to suspect acute ovarian pathology.  Will admit patient at this time.  Amount and/or Complexity of Data Reviewed Labs: ordered. Decision-making details documented in ED Course. Radiology: ordered. Decision-making details documented in ED Course.  Risk Prescription drug management. Decision regarding hospitalization.         Coral Spikes, DO 01/06/23 1836

## 2023-01-06 NOTE — ED Provider Notes (Signed)
EMERGENCY DEPARTMENT AT [facility] Provider Note   CSN: [id number] Arrival date & time: 01/06/23  1412     History  Chief Complaint  Patient presents with   Abdominal Pain    Kelly Huff is a 37 y.o. female.   Pt is a 36y/o female with hx of DM 1, ESRD on PD, TTP, HHS with hx of SBP in July who had been on peritoneal vancomycin until 2 weeks ago who is presenting today with c/o of abd pain.  Patient reports that intermittently she would have cramps and shocks of pain in her abdomen but they would never last long and they always seem to improve after having dialysis however today she started having pain diffusely throughout the abdomen and being on the peritoneal dialysis was not helping and actually got worse when she came off of the machine.  She reports this feels very similar to when she had peritonitis in July but this is worse.  She showed EMS her diastole it and they thought it looked a little cloudy but she has not noticed any frank pus or blood in her fluid.  She has been compliant with treatment daily.  Her blood sugars have been maintaining in the low 100s and she has not had any nausea vomiting or fever.  Patient was given fentanyl by EMS due to significant pain which she initially reports helped but now the pain is coming back.  The history is provided by the patient and medical records.  Abdominal Pain      Home Medications Prior to Admission medications   Medication Sig Start Date End Date Taking? Authorizing Provider  acetaminophen (TYLENOL) 325 MG tablet Take 2 tablets (650 mg total) by mouth every 4 (four) hours as needed for mild pain or fever. 12/05/19   Meuth, Brooke A, PA-C  amLODipine (NORVASC) 10 MG tablet Take 10 mg by mouth in the morning. 11/06/18   [provider]  aspirin EC 81 MG tablet Take 81 mg by mouth daily. Swallow whole.    [provider]  AURYXIA 1 GM 210 MG(Fe) tablet Take 210-630 mg by mouth See admin  instructions. Take 630 mg by mouth three times a day with meals and 210 mg with each snack    [provider]  B Complex-C-Folic Acid (NEPHRO-VITE PO) Take 1 tablet by mouth 3 (three) times a week.    [provider]  clindamycin-benzoyl peroxide (BENZACLIN) gel Apply 1 Application topically in the morning.    [provider]  Continuous Glucose Sensor (DEXCOM G6 SENSOR) MISC Inject 1 Device into the skin See admin instructions. Place 1 new device into the skin every 10 days    [provider]  cyclobenzaprine (FLEXERIL) 10 MG tablet Take 10 mg by mouth at bedtime as needed for muscle spasms.     [provider]  HUMALOG 100 UNIT/ML injection Inject into the skin See admin instructions. Per pump every 3 days- continuous    [provider]  Insulin Disposable Pump (OMNIPOD 5 G6 PODS, GEN 5,) MISC Inject 1 Device into the skin every 3 (three) days.    [provider]  insulin glargine (LANTUS SOLOSTAR) 100 UNIT/ML Solostar Pen Inject into the skin See admin instructions. Use as directed for insulin pump failure 02/05/20   [provider]  insulin lispro (HUMALOG) 100 UNIT/ML injection Continue use of insulin pump with setting as previous: Restart in the morning of 03/23/2020 as Lantus was given  on the morning of 03/22/2020. Patient not taking: Reported on 11/01/2022 03/22/20   Swayze, Ava, DO  medroxyPROGESTERone (PROVERA) 10 MG tablet Take 10 mg by mouth See admin instructions. Take 10 mg by mouth on days 1-10 of each month 09/28/22   [provider]  metoprolol succinate (TOPROL-XL) 50 MG 24 hr tablet Take 100 mg by mouth daily. Take with or immediately following a meal.    [provider]  sodium bicarbonate 650 MG tablet Take 1 tablet (650 mg total) by mouth 3 (three) times daily. Patient not taking: Reported on 11/01/2022 03/22/20   Swayze, Ava, DO  traZODone (DESYREL) 50 MG tablet Take 50 mg by mouth at bedtime as  needed for sleep.    [provider]  tretinoin (RETIN-A) 0.05 % cream Apply 1 application  topically See admin instructions. Apply as directed daily- affected areas 09/14/22   [provider]      Allergies    Morphine, Peanut oil, Sulfamethoxazole-trimethoprim, Covid-19 (mrna) vaccine, and Trulicity [dulaglutide]    Review of Systems   Review of Systems  Gastrointestinal:  Positive for abdominal pain.    Physical Exam Updated Vital Signs BP 131/73 (BP Location: Left Arm)   Pulse (!) 104   Temp 98.3 F (36.8 C) (Oral)   Resp 16   Ht 5\' 7"  (1.702 m)   Wt 76 kg   SpO2 99%   BMI 26.24 kg/m  Physical Exam Vitals and nursing note reviewed.  Constitutional:      General: She is in acute distress.     Appearance: She is well-developed.     Comments: Appears uncomfortable.  HENT:     Head: Normocephalic and atraumatic.  Eyes:     Pupils: Pupils are equal, round, and reactive to light.  Cardiovascular:     Rate and Rhythm: Normal rate and regular rhythm.     Heart sounds: Normal heart sounds. No murmur heard.    No friction rub.  Pulmonary:     Effort: Pulmonary effort is normal.     Breath sounds: Normal breath sounds. No wheezing or rales.  Abdominal:     General: Bowel sounds are normal. There is no distension.     Palpations: Abdomen is soft.     Tenderness: There is generalized abdominal tenderness. There is no guarding or rebound.     Hernia: No hernia is present.     Comments: Generalized pain with palpation.  Peritoneal dialysis catheter site is clean without any drainage  Musculoskeletal:        General: No tenderness. Normal range of motion.     Comments: No edema  Skin:    General: Skin is warm and dry.     Findings: No rash.  Neurological:     Mental Status: She is alert and oriented to person, place, and time. Mental status is at baseline.     Cranial Nerves: No cranial nerve deficit.  Psychiatric:        Behavior: Behavior normal.      ED Results / Procedures / Treatments   Labs (all labs ordered are listed, but only abnormal results are displayed) Labs Reviewed  LIPASE, BLOOD  COMPREHENSIVE METABOLIC PANEL  CBC  HCG, SERUM, QUALITATIVE    EKG None  Radiology No results found.  Procedures Procedures    Medications Ordered in ED Medications - No data to display  ED Course/ Medical Decision Making/ A&P  Medical Decision Making Amount and/or Complexity of Data Reviewed Labs: ordered. Radiology: ordered.  Risk Prescription drug management.   Pt with multiple medical problems and comorbidities and presenting today with a complaint that caries a high risk for morbidity and mortality.  Here today with recurrent abdominal pain in the setting of being on peritoneal dialysis and prior history of SBP who recently discontinued intraperitoneal vancomycin 2 weeks ago.  Concern for recurrent SBP versus perforation, ovarian torsion, ectopic pregnancy rupture, obstruction.  Spoke with Dr. Marisue Humble with nephrology to obtain fluid from patient's peritoneal catheter to test cell counts for concern for SBP.  Patient is still having significant pain and was given further pain medication.  She reports she does not usually have reaction to fentanyl but was given Benadryl prior to arrival because of some itching.         Final Clinical Impression(s) / ED Diagnoses Final diagnoses:  None    Rx / DC Orders ED Discharge Orders     None         Gwyneth Sprout, MD 01/06/23 1554

## 2023-01-07 ENCOUNTER — Encounter (HOSPITAL_COMMUNITY): Payer: Self-pay | Admitting: Internal Medicine

## 2023-01-07 DIAGNOSIS — Z9641 Presence of insulin pump (external) (internal): Secondary | ICD-10-CM | POA: Diagnosis present

## 2023-01-07 DIAGNOSIS — E1022 Type 1 diabetes mellitus with diabetic chronic kidney disease: Secondary | ICD-10-CM | POA: Diagnosis present

## 2023-01-07 DIAGNOSIS — I120 Hypertensive chronic kidney disease with stage 5 chronic kidney disease or end stage renal disease: Secondary | ICD-10-CM | POA: Diagnosis present

## 2023-01-07 DIAGNOSIS — D631 Anemia in chronic kidney disease: Secondary | ICD-10-CM | POA: Diagnosis present

## 2023-01-07 DIAGNOSIS — Y841 Kidney dialysis as the cause of abnormal reaction of the patient, or of later complication, without mention of misadventure at the time of the procedure: Secondary | ICD-10-CM | POA: Diagnosis present

## 2023-01-07 DIAGNOSIS — F419 Anxiety disorder, unspecified: Secondary | ICD-10-CM | POA: Diagnosis present

## 2023-01-07 DIAGNOSIS — Z8049 Family history of malignant neoplasm of other genital organs: Secondary | ICD-10-CM | POA: Diagnosis not present

## 2023-01-07 DIAGNOSIS — Z888 Allergy status to other drugs, medicaments and biological substances status: Secondary | ICD-10-CM | POA: Diagnosis not present

## 2023-01-07 DIAGNOSIS — B957 Other staphylococcus as the cause of diseases classified elsewhere: Secondary | ICD-10-CM | POA: Diagnosis present

## 2023-01-07 DIAGNOSIS — N83202 Unspecified ovarian cyst, left side: Secondary | ICD-10-CM | POA: Diagnosis present

## 2023-01-07 DIAGNOSIS — J36 Peritonsillar abscess: Secondary | ICD-10-CM | POA: Diagnosis present

## 2023-01-07 DIAGNOSIS — M898X9 Other specified disorders of bone, unspecified site: Secondary | ICD-10-CM | POA: Diagnosis present

## 2023-01-07 DIAGNOSIS — Z885 Allergy status to narcotic agent status: Secondary | ICD-10-CM | POA: Diagnosis not present

## 2023-01-07 DIAGNOSIS — Z794 Long term (current) use of insulin: Secondary | ICD-10-CM | POA: Diagnosis not present

## 2023-01-07 DIAGNOSIS — N2581 Secondary hyperparathyroidism of renal origin: Secondary | ICD-10-CM | POA: Diagnosis present

## 2023-01-07 DIAGNOSIS — E876 Hypokalemia: Secondary | ICD-10-CM | POA: Diagnosis present

## 2023-01-07 DIAGNOSIS — Z818 Family history of other mental and behavioral disorders: Secondary | ICD-10-CM | POA: Diagnosis not present

## 2023-01-07 DIAGNOSIS — T8571XD Infection and inflammatory reaction due to peritoneal dialysis catheter, subsequent encounter: Secondary | ICD-10-CM | POA: Diagnosis not present

## 2023-01-07 DIAGNOSIS — N186 End stage renal disease: Secondary | ICD-10-CM | POA: Diagnosis present

## 2023-01-07 DIAGNOSIS — Z79899 Other long term (current) drug therapy: Secondary | ICD-10-CM | POA: Diagnosis not present

## 2023-01-07 DIAGNOSIS — Z881 Allergy status to other antibiotic agents status: Secondary | ICD-10-CM | POA: Diagnosis not present

## 2023-01-07 DIAGNOSIS — T8029XA Infection following other infusion, transfusion and therapeutic injection, initial encounter: Secondary | ICD-10-CM | POA: Diagnosis present

## 2023-01-07 DIAGNOSIS — Z9102 Food additives allergy status: Secondary | ICD-10-CM | POA: Diagnosis not present

## 2023-01-07 DIAGNOSIS — Z7982 Long term (current) use of aspirin: Secondary | ICD-10-CM | POA: Diagnosis not present

## 2023-01-07 DIAGNOSIS — Z8249 Family history of ischemic heart disease and other diseases of the circulatory system: Secondary | ICD-10-CM | POA: Diagnosis not present

## 2023-01-07 DIAGNOSIS — K652 Spontaneous bacterial peritonitis: Secondary | ICD-10-CM | POA: Diagnosis present

## 2023-01-07 DIAGNOSIS — Z992 Dependence on renal dialysis: Secondary | ICD-10-CM | POA: Diagnosis not present

## 2023-01-07 LAB — BASIC METABOLIC PANEL
Anion gap: 15 (ref 5–15)
BUN: 42 mg/dL — ABNORMAL HIGH (ref 6–20)
CO2: 24 mmol/L (ref 22–32)
Calcium: 7.7 mg/dL — ABNORMAL LOW (ref 8.9–10.3)
Chloride: 98 mmol/L (ref 98–111)
Creatinine, Ser: 11.82 mg/dL — ABNORMAL HIGH (ref 0.44–1.00)
GFR, Estimated: 4 mL/min — ABNORMAL LOW (ref >60)
Glucose, Bld: 89 mg/dL (ref 70–99)
Potassium: 3.7 mmol/L (ref 3.5–5.1)
Sodium: 137 mmol/L (ref 135–145)

## 2023-01-07 LAB — CBG MONITORING, ED: Glucose-Capillary: 94 mg/dL (ref 70–99)

## 2023-01-07 LAB — CBC
HCT: 34.2 % — ABNORMAL LOW (ref 36.0–46.0)
Hemoglobin: 10.9 g/dL — ABNORMAL LOW (ref 12.0–15.0)
MCH: 28.1 pg (ref 26.0–34.0)
MCHC: 31.9 g/dL (ref 30.0–36.0)
MCV: 88.1 fL (ref 80.0–100.0)
Platelets: 287 10*3/uL (ref 150–400)
RBC: 3.88 MIL/uL (ref 3.87–5.11)
RDW: 13.2 % (ref 11.5–15.5)
WBC: 9.9 10*3/uL (ref 4.0–10.5)
nRBC: 0 % (ref 0.0–0.2)

## 2023-01-07 LAB — MAGNESIUM: Magnesium: 1.8 mg/dL (ref 1.7–2.4)

## 2023-01-07 LAB — PHOSPHORUS: Phosphorus: 4.2 mg/dL (ref 2.5–4.6)

## 2023-01-07 LAB — PATHOLOGIST SMEAR REVIEW

## 2023-01-07 MED ORDER — DIPHENHYDRAMINE HCL 25 MG PO CAPS
25.0000 mg | ORAL_CAPSULE | Freq: Four times a day (QID) | ORAL | Status: DC | PRN
  Administered 2023-01-07: 25 mg via ORAL
  Filled 2023-01-07: qty 1

## 2023-01-07 MED ORDER — HYDROMORPHONE HCL 1 MG/ML IJ SOLN
0.5000 mg | INTRAMUSCULAR | Status: DC | PRN
  Administered 2023-01-07 (×2): 0.5 mg via INTRAVENOUS
  Filled 2023-01-07 (×2): qty 1

## 2023-01-07 MED ORDER — IPRATROPIUM-ALBUTEROL 0.5-2.5 (3) MG/3ML IN SOLN: 3.0000 mL | RESPIRATORY_TRACT | Status: DC | PRN

## 2023-01-07 MED ORDER — METOPROLOL TARTRATE 5 MG/5ML IV SOLN: 5.0000 mg | INTRAVENOUS | Status: DC | PRN

## 2023-01-07 MED ORDER — CYCLOBENZAPRINE HCL 10 MG PO TABS
10.0000 mg | ORAL_TABLET | Freq: Every evening | ORAL | Status: DC | PRN
  Administered 2023-01-07: 10 mg via ORAL
  Filled 2023-01-07: qty 1

## 2023-01-07 MED ORDER — HYDROXYZINE HCL 25 MG PO TABS: 25.0000 mg | ORAL_TABLET | Freq: Three times a day (TID) | ORAL | Status: DC | PRN

## 2023-01-07 MED ORDER — TRAZODONE HCL 50 MG PO TABS
50.0000 mg | ORAL_TABLET | Freq: Every evening | ORAL | Status: DC | PRN
  Administered 2023-01-08: 50 mg via ORAL
  Filled 2023-01-07: qty 1

## 2023-01-07 MED ORDER — ORAL CARE MOUTH RINSE: 15.0000 mL | OROMUCOSAL | Status: DC | PRN

## 2023-01-07 MED ORDER — GENTAMICIN SULFATE 0.1 % EX CREA
1.0000 | TOPICAL_CREAM | Freq: Every day | CUTANEOUS | Status: DC
  Filled 2023-01-07: qty 15

## 2023-01-07 MED ORDER — DELFLEX-LC/1.5% DEXTROSE 344 MOSM/L IP SOLN: INTRAPERITONEAL | Status: DC

## 2023-01-07 MED ORDER — SENNOSIDES-DOCUSATE SODIUM 8.6-50 MG PO TABS: 1.0000 | ORAL_TABLET | Freq: Every evening | ORAL | Status: DC | PRN

## 2023-01-07 MED ORDER — HYDRALAZINE HCL 20 MG/ML IJ SOLN: 10.0000 mg | INTRAMUSCULAR | Status: DC | PRN

## 2023-01-07 MED ORDER — ASPIRIN 81 MG PO TBEC
81.0000 mg | DELAYED_RELEASE_TABLET | Freq: Every day | ORAL | Status: DC
  Administered 2023-01-07 – 2023-01-09 (×3): 81 mg via ORAL
  Filled 2023-01-07 (×3): qty 1

## 2023-01-07 MED ORDER — PROCHLORPERAZINE EDISYLATE 10 MG/2ML IJ SOLN
5.0000 mg | Freq: Once | INTRAMUSCULAR | Status: AC
  Administered 2023-01-07: 5 mg via INTRAVENOUS
  Filled 2023-01-07: qty 2

## 2023-01-07 NOTE — Progress Notes (Signed)
New Admission Note:   Arrival Method: Via Stretcher from ED Mental Orientation:  A & O x 4 Telemetry: N/A Assessment: Completed Skin: Intact IV:  Rt AC NSL Pain: C/O Abdominal Pain 3/10 Tubes:  Peritoneal Dialysis Catheter Safety Measures: Safety Fall Prevention Plan has been given, discussed and signed Admission: Completed 5 MW Orientation: Patient has been orientated to the room, unit and staff.  Family:  From home with children - 39 yo, 23 yo, and 79 yo  Patient currently has an insulin pump and a continuous glucose monitoring system.  She signed the Insulin Pump Agreement and understands the policy.  She is agreeable to BS checks by the hospital.    She has several pairs of earrings and a necklace.  She has 2 cell phoines and a Consulting civil engineer.  She has her wallet/purse and clothing.  She does not wants to put in the hospital safe.    Orders have been reviewed and implemented. Will continue to monitor the patient. Call light has been placed within reach and bed alarm has been activated.   Bernie Covey RN Phone number: [phone]

## 2023-01-07 NOTE — Progress Notes (Signed)
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  Recently completed course of peritoneal vancomycin now presents back again with abdominal pain.  CT scan concerning for possible colitis/enteritis.  EDP consulted nephrology.  Assessment & Plan:  Principal Problem:   Peritoneal dialysis-associated peritonitis (HCC) Active Problems:   SBP (spontaneous bacterial peritonitis) (HCC)   Ovarian cyst    PD peritonitis  History of recurrent infection with staph epidermis.  Possible colonization/infection of the catheter.  Nephrology team is following.  Currently on vancomycin, rifampin, Rocephin.  Will need to determine long-term plans regarding her catheter.  Peritoneal fluid certainly appears to be infected    ESRD on PD  Hypokalemia, asymptomatic, treating  Per nephrology   Ovarian cyst  Left ovarian hemorrhagic cyst noted on transvaginal ultrasound.  Recommend follow-up ultrasound 2 weeks after her menstruation.   Hypertension  Holding home meds.  IV as needed   Type 1 DM  Patient using on insulin pump.  DVT prophylaxis: heparin injection 5,000 Units Start: 01/06/23 2200 Code Status: Full code Family Communication:   Continue hospital stay for evaluation of recurrent peritonitis    Subjective:  Patient seen at bedside, still having abdominal pain.  Denies any nausea or vomiting.  Examination:  General exam: Appears calm and comfortable  Respiratory system: Clear to auscultation. Respiratory effort normal. Cardiovascular system: S1 & S2 heard, RRR. No JVD, murmurs, rubs, gallops or clicks. No pedal edema. Gastrointestinal system: Abdomen is nondistended, soft and nontender. No organomegaly or masses felt. Normal  bowel sounds heard. Central nervous system: Alert and oriented. No focal neurological deficits. Extremities: Symmetric 5 x 5 power. Skin: No rashes, lesions or ulcers Psychiatry: Judgement and insight appear normal. Mood & affect appropriate.      Diet Orders (From admission, onward)     Start     Ordered   01/07/23 0352  Diet regular Room service appropriate? Yes; Fluid consistency: Thin  Diet effective now       Question Answer Comment  Room service appropriate? Yes   Fluid consistency: Thin      01/07/23 0351            Objective: Vitals:   01/06/23 2159 01/06/23 2327 01/07/23 0317 01/07/23 0318  BP:  106/68  124/74  Pulse:  93  87  Resp:  18  17  Temp: 98.6 F (37 C) 98.6 F (37 C)  98.2 F (36.8 C)  TempSrc: Oral Oral  Oral  SpO2:  96%  100%  Weight:   78.1 kg   Height:   5\' 7"  (1.702 m)     Intake/Output Summary (Last 24 hours) at 01/07/2023 0936 Last data filed at 01/07/2023 0600 Gross per 24 hour  Intake 551.49 ml  Output 0 ml  Net 551.49 ml   Filed Weights   01/06/23 1416 01/07/23 0317  Weight: 76 kg 78.1 kg    Scheduled Meds:  aspirin EC  81 mg Oral Daily   gentamicin cream  1 Application Topical Daily   heparin  5,000 Units Subcutaneous Q8H   insulin pump   Subcutaneous TID WC, HS, 0200   vancomycin variable dose per unstable renal function (pharmacist dosing)   Does not apply See admin instructions   Continuous  Infusions:  cefTRIAXone (ROCEPHIN)  IV Stopped (01/06/23 2111)   dialysis solution 1.5% low-MG/low-CA Stopped (01/06/23 2002)   rifampin (RIFADIN) 600 mg in sodium chloride 0.9 % 100 mL IVPB Stopped (01/06/23 1956)    Nutritional status     Body mass index is 26.97 kg/m.  Data Reviewed:   CBC: Recent Labs  Lab 01/06/23 1418 01/07/23 0147  WBC 11.8* 9.9  HGB 12.6 10.9*  HCT 39.7 34.2*  MCV 90.8 88.1  PLT 405* 287   Basic Metabolic Panel: Recent Labs  Lab 01/06/23 1418 01/07/23 0147  NA 138 137  K 2.6* 3.7  CL  98 98  CO2 25 24  GLUCOSE 120* 89  BUN 33* 42*  CREATININE 11.75* 11.82*  CALCIUM 8.0* 7.7*  MG  --  1.8  PHOS  --  4.2   GFR: Estimated Creatinine Clearance: 6.9 mL/min (A) (by C-G formula based on SCr of 11.82 mg/dL (H)). Liver Function Tests: Recent Labs  Lab 01/06/23 1418  AST 26  ALT 22  ALKPHOS 180*  BILITOT 0.4  PROT 5.8*  ALBUMIN 2.5*   Recent Labs  Lab 01/06/23 1418  LIPASE 26   No results for input(s): "AMMONIA" in the last 168 hours. Coagulation Profile: No results for input(s): "INR", "PROTIME" in the last 168 hours. Cardiac Enzymes: No results for input(s): "CKTOTAL", "CKMB", "CKMBINDEX", "TROPONINI" in the last 168 hours. BNP (last 3 results) No results for input(s): "PROBNP" in the last 8760 hours. HbA1C: No results for input(s): "HGBA1C" in the last 72 hours. CBG: Recent Labs  Lab 01/07/23 0322  GLUCAP 94   Lipid Profile: No results for input(s): "CHOL", "HDL", "LDLCALC", "TRIG", "CHOLHDL", "LDLDIRECT" in the last 72 hours. Thyroid Function Tests: No results for input(s): "TSH", "T4TOTAL", "FREET4", "T3FREE", "THYROIDAB" in the last 72 hours. Anemia Panel: No results for input(s): "VITAMINB12", "FOLATE", "FERRITIN", "TIBC", "IRON", "RETICCTPCT" in the last 72 hours. Sepsis Labs: Recent Labs  Lab 01/06/23 1622 01/06/23 2047  LATICACIDVEN 1.2 1.3    Recent Results (from the past 240 hour(s))  Culture, body fluid w Gram Stain-bottle     Status: None (Preliminary result)   Collection Time: 01/06/23  2:53 PM   Specimen: Fluid  Result Value Ref Range Status   Specimen Description FLUID PERITONEAL DIALYSIS  Final   Special Requests BOTTLES DRAWN AEROBIC AND ANAEROBIC  Final   Culture   Final    NO GROWTH < 24 HOURS Performed at [facility], [address] [place], [place] [address]    Report Status PENDING  Incomplete  Gram stain     Status: None   Collection Time: 01/06/23  2:53 PM   Specimen: Fluid  Result Value Ref Range  Status   Specimen Description FLUID PERITONEAL DIALYSIS  Final   Special Requests NONE  Final   Gram Stain   Final    MODERATE WBC PRESENT, PREDOMINANTLY PMN RARE INTRACELLULAR GRAM POSITIVE COCCI CRITICAL RESULT CALLED TO, READ BACK BY AND VERIFIED WITH: RN NADIA PATTERSON ON 01/06/23 @ 1951 BY DRT Performed at [facility], [address], [place], [place] [address]    Report Status 01/06/2023 FINAL  Final         Radiology Studies: US PELVIS TRANSVAGINAL NON-OB (TV ONLY)  Result Date: 01/06/2023 CLINICAL DATA:  Left ovarian complex cyst seen on the earlier CT. EXAM: TRANSVAGINAL ULTRASOUND OF PELVIS DOPPLER ULTRASOUND OF OVARIES TECHNIQUE: Transvaginal ultrasound examination of the pelvis was performed including evaluation of the uterus, ovaries,  adnexal regions, and pelvic cul-de-sac. Color and duplex Doppler ultrasound was utilized to evaluate blood flow to the ovaries. COMPARISON:  CT abdomen pelvis dated 01/06/2023. FINDINGS: Uterus Measurements: 7.6 x 4.3 x 5.0 cm = volume: 85 mL. No fibroids or other mass visualized. Endometrium Thickness: 6 mm.  No focal abnormality visualized. Right ovary Measurements: 2.4 x 1.3 x 1.4 cm = volume: 2.3 mL. A 1.6 cm right ovarian cyst. Left ovary Measurements: 4.1 x 3.2 x 3.7 cm = volume: 25 ML. There is a 4.1 x 3.2 x 3.7 cm complex cyst with lace-like internal architecture and no internal vascularity in the right ovary most consistent with a hemorrhagic cyst. Pulsed Doppler evaluation demonstrates normal low-resistance arterial and venous waveforms in both ovaries. Other findings:  No abnormal free fluid IMPRESSION: Left ovarian hemorrhagic cyst. Follow-up with ultrasound after 2 menstrual cycles recommended. Electronically Signed   By: Elgie Collard M.D.   On: 01/06/2023 22:44   US PELVIC DOPPLER (TORSION R/O OR MASS ARTERIAL FLOW)  Result Date: 01/06/2023 CLINICAL DATA:  Left ovarian complex cyst seen on the earlier CT. EXAM: TRANSVAGINAL  ULTRASOUND OF PELVIS DOPPLER ULTRASOUND OF OVARIES TECHNIQUE: Transvaginal ultrasound examination of the pelvis was performed including evaluation of the uterus, ovaries, adnexal regions, and pelvic cul-de-sac. Color and duplex Doppler ultrasound was utilized to evaluate blood flow to the ovaries. COMPARISON:  CT abdomen pelvis dated 01/06/2023. FINDINGS: Uterus Measurements: 7.6 x 4.3 x 5.0 cm = volume: 85 mL. No fibroids or other mass visualized. Endometrium Thickness: 6 mm.  No focal abnormality visualized. Right ovary Measurements: 2.4 x 1.3 x 1.4 cm = volume: 2.3 mL. A 1.6 cm right ovarian cyst. Left ovary Measurements: 4.1 x 3.2 x 3.7 cm = volume: 25 ML. There is a 4.1 x 3.2 x 3.7 cm complex cyst with lace-like internal architecture and no internal vascularity in the right ovary most consistent with a hemorrhagic cyst. Pulsed Doppler evaluation demonstrates normal low-resistance arterial and venous waveforms in both ovaries. Other findings:  No abnormal free fluid IMPRESSION: Left ovarian hemorrhagic cyst. Follow-up with ultrasound after 2 menstrual cycles recommended. Electronically Signed   By: Elgie Collard M.D.   On: 01/06/2023 22:44   CT ABDOMEN PELVIS WO CONTRAST  Result Date: 01/06/2023 CLINICAL DATA:  Generalized abdominal pain. Peritoneal dialysis patient. EXAM: CT ABDOMEN AND PELVIS WITHOUT CONTRAST TECHNIQUE: Multidetector CT imaging of the abdomen and pelvis was performed following the standard protocol without IV contrast. RADIATION DOSE REDUCTION: This exam was performed according to the departmental dose-optimization program which includes automated exposure control, adjustment of the mA and/or kV according to patient size and/or use of iterative reconstruction technique. COMPARISON:  CT 01/01/2023 and older FINDINGS: Lower chest: There is some linear opacity lung bases likely scar or atelectasis. No pleural effusion. Coronary artery calcifications are seen. Hepatobiliary: On this non IV  contrast exam, grossly preserved hepatic parenchyma. There is high density material in the distended gallbladder. This could be vicarious excretion of previous contrast material. Pancreas: Mild atrophy of the pancreas. Spleen: Normal in size without focal abnormality. Adrenals/Urinary Tract: The adrenal glands are preserved. Severe atrophy of the kidneys. The calcifications along the kidneys may be more vascular than collecting system. No collecting system dilatation. The ureters have normal course and caliber extending down to the bladder. There is some high attenuation material in the bladder. Please correlate previous contrast. The bladder is underdistended. Stomach/Bowel: On this non oral contrast exam the large bowel has a normal course and caliber. There  are areas of wall thickening along the colon particularly along the ascending colon. Mild along the transverse colon. This has new from previous. Please correlate for developing colitis. Surgical changes in the area of the base of the cecum in the appendix is not well seen. Stomach has some luminal fluid and is nondilated. Small bowel is nondilated. Question some areas of small-bowel wall thickening. Vascular/Lymphatic: Diffuse vascular calcifications are identified. Normal caliber aorta and IVC. No discrete abnormal lymph node enlargement identified in the abdomen and pelvis. Reproductive: Uterus is present. There is new cystic lesion in the left adnexa measuring 4.0 x 3.1 cm. This was much smaller 5 days ago measuring 18 mm. Likely a functional cyst with the time course. However please correlate with any particular symptoms and if there is concern of torsion recommend additional workup with ultrasound. Other: Peritoneal dialysis catheter in place. Increasing free air and fluid. Free air would has a differential but could be related to the peritoneal dialysis. Bowel perforation would be difficult to exclude in principle Musculoskeletal: There is diffuse  sclerosis and trabecular thickening of the left hemipelvis. Unchanged from previous. Please correlate for any history including Paget's disease. IMPRESSION: Scattered free air and free fluid increased from the study of January 01 2023, however the patient is with a peritoneal dialysis catheter. Developing 4 cm left adnexal cystic structure, likely ovarian. With the time course favor a functional cyst. Please correlate with any particular symptoms and if there is concern of torsion a follow up ultrasound may be useful as the next step. Wall thickening along the right side of the colon. Questionable areas of thickening along the small bowel. Please correlate for any clinical evidence of a colitis or enteritis. Atrophic native kidneys.  Diffuse vascular calcifications. Electronically Signed   By: Karen Kays M.D.   On: 01/06/2023 17:47           LOS: 0 days   Time spent= 35 mins    Miguel Rota, MD [facility]  If 7PM-7AM, please contact night-coverage  01/07/2023, 9:36 AM

## 2023-01-07 NOTE — TOC CM/SW Note (Signed)
Transition of Care [facility]) - Inpatient Brief Assessment   Patient Details  Name: Kelly Huff MRN: [id number] Date of Birth: 24-Sep-1985  Transition of Care [facility] CM/SW Contact:    Tom-Johnson, Hershal Coria, RN Phone Number: 01/07/2023, 5:02 PM   Clinical Narrative:  Patient presented to the ED with Abdominal pain. Patient on home  Peritoneal Dialysis, home therapy clinic is [facility] on [facility]. Currently on inpatient PD. Admitted with Peritonitis, on IV abx.  From home with two children. Not employed, on disability. Both parents and four siblings supportive. Does not have DME's at home.  PCP is Alain Marion, MD and uses [facility] on [address]  No TOC needs or recommendations noted at this time.  Patient not Medically ready for discharge.  CM will continue to follow as patient progresses with care towards discharge.           Transition of Care Asessment: Insurance and Status: Insurance coverage has been reviewed Patient has primary care physician: Yes Home environment has been reviewed: Yes Prior level of function:: Independent- PD at home Prior/Current Home Services: No current home services Social Determinants of Health Reivew: SDOH reviewed no interventions necessary Readmission risk has been reviewed: Yes Transition of care needs: no transition of care needs at this time

## 2023-01-07 NOTE — Progress Notes (Signed)
[facility] Progress Note   Subjective:     Objective Vitals:   01/06/23 2327 01/07/23 0317 01/07/23 0318 01/07/23 0947  BP: 106/68  124/74 120/80  Pulse: 93  87 80  Resp: 18  17 18   Temp: 98.6 F (37 C)  98.2 F (36.8 C) 98.2 F (36.8 C)  TempSrc: Oral  Oral Oral  SpO2: 96%  100% 100%  Weight:  78.1 kg    Height:  5\' 7"  (1.702 m)     Physical Exam General: Heart: Lungs: Abdomen: Extremities: Dialysis Access:   Additional Objective Labs: Basic Metabolic Panel: Recent Labs  Lab 01/06/23 1418 01/07/23 0147  NA 138 137  K 2.6* 3.7  CL 98 98  CO2 25 24  GLUCOSE 120* 89  BUN 33* 42*  CREATININE 11.75* 11.82*  CALCIUM 8.0* 7.7*  PHOS  --  4.2   Liver Function Tests: Recent Labs  Lab 01/06/23 1418  AST 26  ALT 22  ALKPHOS 180*  BILITOT 0.4  PROT 5.8*  ALBUMIN 2.5*   Recent Labs  Lab 01/06/23 1418  LIPASE 26   CBC: Recent Labs  Lab 01/06/23 1418 01/07/23 0147  WBC 11.8* 9.9  HGB 12.6 10.9*  HCT 39.7 34.2*  MCV 90.8 88.1  PLT 405* 287   Blood Culture    Component Value Date/Time   SDES FLUID PERITONEAL DIALYSIS 01/06/2023 1453   SDES FLUID PERITONEAL DIALYSIS 01/06/2023 1453   SPECREQUEST BOTTLES DRAWN AEROBIC AND ANAEROBIC 01/06/2023 1453   SPECREQUEST NONE 01/06/2023 1453   CULT  01/06/2023 1453    NO GROWTH < 24 HOURS Performed at [facility], [address], [place], [place] [address]    REPTSTATUS PENDING 01/06/2023 1453   REPTSTATUS 01/06/2023 FINAL 01/06/2023 1453    Cardiac Enzymes: No results for input(s): "CKTOTAL", "CKMB", "CKMBINDEX", "TROPONINI" in the last 168 hours. CBG: Recent Labs  Lab 01/07/23 0322  GLUCAP 94   Iron Studies: No results for input(s): "IRON", "TIBC", "TRANSFERRIN", "FERRITIN" in the last 72 hours. @lablastinr3 @ Studies/Results: US PELVIS TRANSVAGINAL NON-OB (TV ONLY)  Result Date: 01/06/2023 CLINICAL DATA:  Left ovarian complex cyst seen on the earlier CT. EXAM:  TRANSVAGINAL ULTRASOUND OF PELVIS DOPPLER ULTRASOUND OF OVARIES TECHNIQUE: Transvaginal ultrasound examination of the pelvis was performed including evaluation of the uterus, ovaries, adnexal regions, and pelvic cul-de-sac. Color and duplex Doppler ultrasound was utilized to evaluate blood flow to the ovaries. COMPARISON:  CT abdomen pelvis dated 01/06/2023. FINDINGS: Uterus Measurements: 7.6 x 4.3 x 5.0 cm = volume: 85 mL. No fibroids or other mass visualized. Endometrium Thickness: 6 mm.  No focal abnormality visualized. Right ovary Measurements: 2.4 x 1.3 x 1.4 cm = volume: 2.3 mL. A 1.6 cm right ovarian cyst. Left ovary Measurements: 4.1 x 3.2 x 3.7 cm = volume: 25 ML. There is a 4.1 x 3.2 x 3.7 cm complex cyst with lace-like internal architecture and no internal vascularity in the right ovary most consistent with a hemorrhagic cyst. Pulsed Doppler evaluation demonstrates normal low-resistance arterial and venous waveforms in both ovaries. Other findings:  No abnormal free fluid IMPRESSION: Left ovarian hemorrhagic cyst. Follow-up with ultrasound after 2 menstrual cycles recommended. Electronically Signed   By: Elgie Collard M.D.   On: 01/06/2023 22:44   US PELVIC DOPPLER (TORSION R/O OR MASS ARTERIAL FLOW)  Result Date: 01/06/2023 CLINICAL DATA:  Left ovarian complex cyst seen on the earlier CT. EXAM: TRANSVAGINAL ULTRASOUND OF PELVIS DOPPLER ULTRASOUND OF OVARIES TECHNIQUE: Transvaginal ultrasound examination  of the pelvis was performed including evaluation of the uterus, ovaries, adnexal regions, and pelvic cul-de-sac. Color and duplex Doppler ultrasound was utilized to evaluate blood flow to the ovaries. COMPARISON:  CT abdomen pelvis dated 01/06/2023. FINDINGS: Uterus Measurements: 7.6 x 4.3 x 5.0 cm = volume: 85 mL. No fibroids or other mass visualized. Endometrium Thickness: 6 mm.  No focal abnormality visualized. Right ovary Measurements: 2.4 x 1.3 x 1.4 cm = volume: 2.3 mL. A 1.6 cm right ovarian  cyst. Left ovary Measurements: 4.1 x 3.2 x 3.7 cm = volume: 25 ML. There is a 4.1 x 3.2 x 3.7 cm complex cyst with lace-like internal architecture and no internal vascularity in the right ovary most consistent with a hemorrhagic cyst. Pulsed Doppler evaluation demonstrates normal low-resistance arterial and venous waveforms in both ovaries. Other findings:  No abnormal free fluid IMPRESSION: Left ovarian hemorrhagic cyst. Follow-up with ultrasound after 2 menstrual cycles recommended. Electronically Signed   By: Elgie Collard M.D.   On: 01/06/2023 22:44   CT ABDOMEN PELVIS WO CONTRAST  Result Date: 01/06/2023 CLINICAL DATA:  Generalized abdominal pain. Peritoneal dialysis patient. EXAM: CT ABDOMEN AND PELVIS WITHOUT CONTRAST TECHNIQUE: Multidetector CT imaging of the abdomen and pelvis was performed following the standard protocol without IV contrast. RADIATION DOSE REDUCTION: This exam was performed according to the departmental dose-optimization program which includes automated exposure control, adjustment of the mA and/or kV according to patient size and/or use of iterative reconstruction technique. COMPARISON:  CT 01/01/2023 and older FINDINGS: Lower chest: There is some linear opacity lung bases likely scar or atelectasis. No pleural effusion. Coronary artery calcifications are seen. Hepatobiliary: On this non IV contrast exam, grossly preserved hepatic parenchyma. There is high density material in the distended gallbladder. This could be vicarious excretion of previous contrast material. Pancreas: Mild atrophy of the pancreas. Spleen: Normal in size without focal abnormality. Adrenals/Urinary Tract: The adrenal glands are preserved. Severe atrophy of the kidneys. The calcifications along the kidneys may be more vascular than collecting system. No collecting system dilatation. The ureters have normal course and caliber extending down to the bladder. There is some high attenuation material in the bladder.  Please correlate previous contrast. The bladder is underdistended. Stomach/Bowel: On this non oral contrast exam the large bowel has a normal course and caliber. There are areas of wall thickening along the colon particularly along the ascending colon. Mild along the transverse colon. This has new from previous. Please correlate for developing colitis. Surgical changes in the area of the base of the cecum in the appendix is not well seen. Stomach has some luminal fluid and is nondilated. Small bowel is nondilated. Question some areas of small-bowel wall thickening. Vascular/Lymphatic: Diffuse vascular calcifications are identified. Normal caliber aorta and IVC. No discrete abnormal lymph node enlargement identified in the abdomen and pelvis. Reproductive: Uterus is present. There is new cystic lesion in the left adnexa measuring 4.0 x 3.1 cm. This was much smaller 5 days ago measuring 18 mm. Likely a functional cyst with the time course. However please correlate with any particular symptoms and if there is concern of torsion recommend additional workup with ultrasound. Other: Peritoneal dialysis catheter in place. Increasing free air and fluid. Free air would has a differential but could be related to the peritoneal dialysis. Bowel perforation would be difficult to exclude in principle Musculoskeletal: There is diffuse sclerosis and trabecular thickening of the left hemipelvis. Unchanged from previous. Please correlate for any history including Paget's disease. IMPRESSION: Scattered  free air and free fluid increased from the study of January 01 2023, however the patient is with a peritoneal dialysis catheter. Developing 4 cm left adnexal cystic structure, likely ovarian. With the time course favor a functional cyst. Please correlate with any particular symptoms and if there is concern of torsion a follow up ultrasound may be useful as the next step. Wall thickening along the right side of the colon. Questionable  areas of thickening along the small bowel. Please correlate for any clinical evidence of a colitis or enteritis. Atrophic native kidneys.  Diffuse vascular calcifications. Electronically Signed   By: Karen Kays M.D.   On: 01/06/2023 17:47   Medications:  cefTRIAXone (ROCEPHIN)  IV Stopped (01/06/23 2111)   dialysis solution 1.5% low-MG/low-CA Stopped (01/06/23 2002)   rifampin (RIFADIN) 600 mg in sodium chloride 0.9 % 100 mL IVPB Stopped (01/06/23 1956)    aspirin EC  81 mg Oral Daily   gentamicin cream  1 Application Topical Daily   heparin  5,000 Units Subcutaneous Q8H   insulin pump   Subcutaneous TID WC, HS, 0200   vancomycin variable dose per unstable renal function (pharmacist dosing)   Does not apply See admin instructions    Outpatient Dialysis Orders: 7X Week, EDW 72 (kg) Ca 2.5 (mEq/L) Mg 0.5 (mEq/L), Dextrose 1.5; 2.5; 4.25 %, # Exchanges 5, fill vol 2450 mL, last fill vol 1200 mL   Assessment/Plan:  Recurrent peritonitis: RN reports grossly abnormal PD effluent drawn for cell count and culture, cell count 10,100 and gram stain positive. Culture on 12/07/22 outpatient grew coagulase negative staph. She reports she completed 1 week of IP vancomycin. Likely needs longer antibiotic course. Will add rifampin (last culture was susceptible). Consulting pharmacy for antibiotic dosing. Likely needs 3 week course. If infection still does not clear, may need PD cath removed. Repeating cell count in AM to make sure she is improving.   ESRD:  On PD. Usually does 4 exchanges and a pause, will do 5 exchanges overnight here, no pause. Started HD around 4AM, will end after 3 cycles today and resume PD tonight,  Hypertension/volume: BP running high outpatient, controlled here, euvolemic on exam. Appears she was using all 1.5% dextrose recently, will continue today, will adjust as needed based on BP/volume status  Anemia: Hgb at goal. No ESA indicated  Metabolic bone disease: CCalcium and phos  controlled, continue current medications  Nutrition:  Alb 2.2, continue renal diet and protein supplements   Rogers Blocker, PA-C 01/07/2023, 10:32 AM  [facility] Pager: [phone]

## 2023-01-07 NOTE — Inpatient Diabetes Management (Addendum)
Inpatient Diabetes Program Recommendations  AACE/ADA: New Consensus Statement on Inpatient Glycemic Control (2015)  Target Ranges:  Prepandial:   less than 140 mg/dL      Peak postprandial:   less than 180 mg/dL (1-2 hours)      Critically ill patients:  140 - 180 mg/dL   Lab Results  Component Value Date   GLUCAP 94 01/07/2023   HGBA1C 6.8 (H) 03/21/2020    Review of Glycemic Control  Diabetes history: Diabetes Type 1 Outpatient Diabetes medications:  Omipod in [employer], basal rates range from 12-16 units in a 24 hour period CHO ration 1:15, sensitivity 1:28 active insulin time 4 hours Target 110-120 Dexcom G7  Current orders for Inpatient glycemic control:  Insulin pump order set  Dr. Baldwin Crown, Endocrinologist [facility]  Pt with extra supplies at bedside. Spoke with pt at bedside ands reviewed her insulin pump settings.  Omnipod insulin pump located on right thigh new site started today Dexcom G7 located on right arm with 6 days left before scheduled to be changed.   Thanks,  Christena Deem RN, MSN, BC-ADM Inpatient Diabetes Coordinator Team Pager [phone] (8a-5p)

## 2023-01-07 NOTE — Plan of Care (Signed)

## 2023-01-07 NOTE — Progress Notes (Signed)
Addendum to incomplete note from this AM:  Subjective: Patients reports she still has abdominal pain but it is improving. She denies SOB, CP, dizziness, nausea. Currently on PD cycler, did not start until around 4AM because it took awhile to get into her room from the ED.   Physical exam: General: WDWN female in NAD Pulm: respirations unlabored on RA Abdomen: soft, mildly TTP bilateral lower quadrants Extremities: no edema b/l lower extremities  Dialysis access: PD cath in lower abdomen, no visible drainage, attached to cycler  Rogers Blocker PA-C [facility]

## 2023-01-08 DIAGNOSIS — T8571XD Infection and inflammatory reaction due to peritoneal dialysis catheter, subsequent encounter: Secondary | ICD-10-CM | POA: Diagnosis not present

## 2023-01-08 LAB — BASIC METABOLIC PANEL
Anion gap: 12 (ref 5–15)
BUN: 37 mg/dL — ABNORMAL HIGH (ref 6–20)
CO2: 26 mmol/L (ref 22–32)
Calcium: 7.6 mg/dL — ABNORMAL LOW (ref 8.9–10.3)
Chloride: 96 mmol/L — ABNORMAL LOW (ref 98–111)
Creatinine, Ser: 10.89 mg/dL — ABNORMAL HIGH (ref 0.44–1.00)
GFR, Estimated: 4 mL/min — ABNORMAL LOW (ref >60)
Glucose, Bld: 182 mg/dL — ABNORMAL HIGH (ref 70–99)
Potassium: 3 mmol/L — ABNORMAL LOW (ref 3.5–5.1)
Sodium: 134 mmol/L — ABNORMAL LOW (ref 135–145)

## 2023-01-08 LAB — BODY FLUID CELL COUNT WITH DIFFERENTIAL
Eos, Fluid: 2 %
Lymphs, Fluid: 9 %
Monocyte-Macrophage-Serous Fluid: 29 % — ABNORMAL LOW (ref 50–90)
Neutrophil Count, Fluid: 60 % — ABNORMAL HIGH (ref 0–25)
Total Nucleated Cell Count, Fluid: 1046 uL — ABNORMAL HIGH (ref 0–1000)

## 2023-01-08 LAB — CBC
HCT: 36.1 % (ref 36.0–46.0)
Hemoglobin: 11.7 g/dL — ABNORMAL LOW (ref 12.0–15.0)
MCH: 29.3 pg (ref 26.0–34.0)
MCHC: 32.4 g/dL (ref 30.0–36.0)
MCV: 90.3 fL (ref 80.0–100.0)
Platelets: 307 10*3/uL (ref 150–400)
RBC: 4 MIL/uL (ref 3.87–5.11)
RDW: 13.1 % (ref 11.5–15.5)
WBC: 5 10*3/uL (ref 4.0–10.5)
nRBC: 0 % (ref 0.0–0.2)

## 2023-01-08 LAB — MAGNESIUM: Magnesium: 1.9 mg/dL (ref 1.7–2.4)

## 2023-01-08 LAB — VANCOMYCIN, RANDOM: Vancomycin Rm: 35 ug/mL

## 2023-01-08 MED ORDER — HEPARIN SODIUM (PORCINE) 1000 UNIT/ML IJ SOLN
INTRAPERITONEAL | Status: DC | PRN
  Filled 2023-01-08 (×3): qty 6000

## 2023-01-08 MED ORDER — CYCLOBENZAPRINE HCL 10 MG PO TABS
10.0000 mg | ORAL_TABLET | Freq: Three times a day (TID) | ORAL | Status: DC | PRN
  Administered 2023-01-08 (×2): 10 mg via ORAL
  Filled 2023-01-08 (×2): qty 1

## 2023-01-08 NOTE — Progress Notes (Signed)
   01/08/23 0711  Peritoneal Catheter Right lower abdomen Continuous cycling  No placement date or time found.   Catheter Location: Right lower abdomen  Dialysis Type: Continuous cycling  Site Assessment Clean, Dry, Intact;Clean;Dry  Drainage Description None  Catheter status Deaccessed  Dressing Gauze/Drain sponge  Dressing Status Clean, Dry, Intact  Completion  Treatment Status Complete  Initial Drain Volume 219  Average Dwell Time-Hour(s) 1  Average Dwell Time-Min(s) 30  Average Drain Time 26  Total Therapy Volume 47829  Total Therapy Time-Hour(s) 10  Total Therapy Time-Min(s) 54  Effluent Appearance Amber;Clear  Cell Count on Daytime Exchange N/A  Fluid Balance - CCPD  Total UF (+ value on cycler, pt loss) 525 mL  Procedure Comments  Tolerated treatment well? Yes  Peritoneal Dialysis Comments Treatment complete  Hand-off documentation  Hand-off Given Given to shift RN/LPN  Report given to (Full Name) Meredith Staggers  Hand-off Received Received from shift RN/LPN  Report received from (Full Name) Larene Pickett ComrieRN   Slow drain detected by cycler, reposition was effective. Will continue to access, may need heparin.

## 2023-01-08 NOTE — Progress Notes (Addendum)
Detected slow drain during treatment. Hand off given to [employer].

## 2023-01-08 NOTE — Progress Notes (Addendum)
[facility] Progress Note   Subjective:  Seen in room - still with abd pain - she is unclear if improved from yesterday - plan is to try to repeat her PD cell count today. No CP/dyspnea. Per notes - PD slowly draining last night -> will add heparin tonight if still here, did UF ~546mL. Perhaps can leave today? Vanc level ordered for today.  Objective Vitals:   01/07/23 0318 01/07/23 0947 01/08/23 0600 01/08/23 0830  BP: 124/74 120/80 (!) 140/82 129/71  Pulse: 87 80 85 84  Resp: 17 18    Temp: 98.2 F (36.8 C) 98.2 F (36.8 C) 98.3 F (36.8 C) 98.6 F (37 C)  TempSrc: Oral Oral    SpO2: 100% 100% 100% 100%  Weight:      Height:       Physical Exam General: Well appearing woman, NAD Heart: RRR; no murmur Lungs: CTAB; no rales Abdomen: diffusely tender with palpation, soft Extremities: no LE edema Dialysis Access: PD cath  Additional Objective Labs: Basic Metabolic Panel: Recent Labs  Lab 01/06/23 1418 01/07/23 0147  NA 138 137  K 2.6* 3.7  CL 98 98  CO2 25 24  GLUCOSE 120* 89  BUN 33* 42*  CREATININE 11.75* 11.82*  CALCIUM 8.0* 7.7*  PHOS  --  4.2   Liver Function Tests: Recent Labs  Lab 01/06/23 1418  AST 26  ALT 22  ALKPHOS 180*  BILITOT 0.4  PROT 5.8*  ALBUMIN 2.5*   Recent Labs  Lab 01/06/23 1418  LIPASE 26   CBC: Recent Labs  Lab 01/06/23 1418 01/07/23 0147  WBC 11.8* 9.9  HGB 12.6 10.9*  HCT 39.7 34.2*  MCV 90.8 88.1  PLT 405* 287   Blood Culture    Component Value Date/Time   SDES FLUID PERITONEAL DIALYSIS 01/06/2023 1453   SDES FLUID PERITONEAL DIALYSIS 01/06/2023 1453   SPECREQUEST BOTTLES DRAWN AEROBIC AND ANAEROBIC 01/06/2023 1453   SPECREQUEST NONE 01/06/2023 1453   CULT GRAM POSITIVE COCCI 01/06/2023 1453   REPTSTATUS PENDING 01/06/2023 1453   REPTSTATUS 01/06/2023 FINAL 01/06/2023 1453   Studies/Results: US PELVIS TRANSVAGINAL NON-OB (TV ONLY)  Result Date: 01/06/2023 CLINICAL DATA:  Left ovarian  complex cyst seen on the earlier CT. EXAM: TRANSVAGINAL ULTRASOUND OF PELVIS DOPPLER ULTRASOUND OF OVARIES TECHNIQUE: Transvaginal ultrasound examination of the pelvis was performed including evaluation of the uterus, ovaries, adnexal regions, and pelvic cul-de-sac. Color and duplex Doppler ultrasound was utilized to evaluate blood flow to the ovaries. COMPARISON:  CT abdomen pelvis dated 01/06/2023. FINDINGS: Uterus Measurements: 7.6 x 4.3 x 5.0 cm = volume: 85 mL. No fibroids or other mass visualized. Endometrium Thickness: 6 mm.  No focal abnormality visualized. Right ovary Measurements: 2.4 x 1.3 x 1.4 cm = volume: 2.3 mL. A 1.6 cm right ovarian cyst. Left ovary Measurements: 4.1 x 3.2 x 3.7 cm = volume: 25 ML. There is a 4.1 x 3.2 x 3.7 cm complex cyst with lace-like internal architecture and no internal vascularity in the right ovary most consistent with a hemorrhagic cyst. Pulsed Doppler evaluation demonstrates normal low-resistance arterial and venous waveforms in both ovaries. Other findings:  No abnormal free fluid IMPRESSION: Left ovarian hemorrhagic cyst. Follow-up with ultrasound after 2 menstrual cycles recommended. Electronically Signed   By: Elgie Collard M.D.   On: 01/06/2023 22:44   US PELVIC DOPPLER (TORSION R/O OR MASS ARTERIAL FLOW)  Result Date: 01/06/2023 CLINICAL DATA:  Left ovarian complex cyst seen on the earlier CT. EXAM: TRANSVAGINAL  ULTRASOUND OF PELVIS DOPPLER ULTRASOUND OF OVARIES TECHNIQUE: Transvaginal ultrasound examination of the pelvis was performed including evaluation of the uterus, ovaries, adnexal regions, and pelvic cul-de-sac. Color and duplex Doppler ultrasound was utilized to evaluate blood flow to the ovaries. COMPARISON:  CT abdomen pelvis dated 01/06/2023. FINDINGS: Uterus Measurements: 7.6 x 4.3 x 5.0 cm = volume: 85 mL. No fibroids or other mass visualized. Endometrium Thickness: 6 mm.  No focal abnormality visualized. Right ovary Measurements: 2.4 x 1.3 x 1.4  cm = volume: 2.3 mL. A 1.6 cm right ovarian cyst. Left ovary Measurements: 4.1 x 3.2 x 3.7 cm = volume: 25 ML. There is a 4.1 x 3.2 x 3.7 cm complex cyst with lace-like internal architecture and no internal vascularity in the right ovary most consistent with a hemorrhagic cyst. Pulsed Doppler evaluation demonstrates normal low-resistance arterial and venous waveforms in both ovaries. Other findings:  No abnormal free fluid IMPRESSION: Left ovarian hemorrhagic cyst. Follow-up with ultrasound after 2 menstrual cycles recommended. Electronically Signed   By: Elgie Collard M.D.   On: 01/06/2023 22:44   CT ABDOMEN PELVIS WO CONTRAST  Result Date: 01/06/2023 CLINICAL DATA:  Generalized abdominal pain. Peritoneal dialysis patient. EXAM: CT ABDOMEN AND PELVIS WITHOUT CONTRAST TECHNIQUE: Multidetector CT imaging of the abdomen and pelvis was performed following the standard protocol without IV contrast. RADIATION DOSE REDUCTION: This exam was performed according to the departmental dose-optimization program which includes automated exposure control, adjustment of the mA and/or kV according to patient size and/or use of iterative reconstruction technique. COMPARISON:  CT 01/01/2023 and older FINDINGS: Lower chest: There is some linear opacity lung bases likely scar or atelectasis. No pleural effusion. Coronary artery calcifications are seen. Hepatobiliary: On this non IV contrast exam, grossly preserved hepatic parenchyma. There is high density material in the distended gallbladder. This could be vicarious excretion of previous contrast material. Pancreas: Mild atrophy of the pancreas. Spleen: Normal in size without focal abnormality. Adrenals/Urinary Tract: The adrenal glands are preserved. Severe atrophy of the kidneys. The calcifications along the kidneys may be more vascular than collecting system. No collecting system dilatation. The ureters have normal course and caliber extending down to the bladder. There is  some high attenuation material in the bladder. Please correlate previous contrast. The bladder is underdistended. Stomach/Bowel: On this non oral contrast exam the large bowel has a normal course and caliber. There are areas of wall thickening along the colon particularly along the ascending colon. Mild along the transverse colon. This has new from previous. Please correlate for developing colitis. Surgical changes in the area of the base of the cecum in the appendix is not well seen. Stomach has some luminal fluid and is nondilated. Small bowel is nondilated. Question some areas of small-bowel wall thickening. Vascular/Lymphatic: Diffuse vascular calcifications are identified. Normal caliber aorta and IVC. No discrete abnormal lymph node enlargement identified in the abdomen and pelvis. Reproductive: Uterus is present. There is new cystic lesion in the left adnexa measuring 4.0 x 3.1 cm. This was much smaller 5 days ago measuring 18 mm. Likely a functional cyst with the time course. However please correlate with any particular symptoms and if there is concern of torsion recommend additional workup with ultrasound. Other: Peritoneal dialysis catheter in place. Increasing free air and fluid. Free air would has a differential but could be related to the peritoneal dialysis. Bowel perforation would be difficult to exclude in principle Musculoskeletal: There is diffuse sclerosis and trabecular thickening of the left hemipelvis. Unchanged from  previous. Please correlate for any history including Paget's disease. IMPRESSION: Scattered free air and free fluid increased from the study of January 01 2023, however the patient is with a peritoneal dialysis catheter. Developing 4 cm left adnexal cystic structure, likely ovarian. With the time course favor a functional cyst. Please correlate with any particular symptoms and if there is concern of torsion a follow up ultrasound may be useful as the next step. Wall thickening  along the right side of the colon. Questionable areas of thickening along the small bowel. Please correlate for any clinical evidence of a colitis or enteritis. Atrophic native kidneys.  Diffuse vascular calcifications. Electronically Signed   By: Karen Kays M.D.   On: 01/06/2023 17:47   Medications:  dialysis solution 1.5% low-MG/low-CA Stopped (01/06/23 2002)   dialysis solution 1.5% low-MG/low-CA     rifampin (RIFADIN) 600 mg in sodium chloride 0.9 % 100 mL IVPB 600 mg (01/07/23 1624)    aspirin EC  81 mg Oral Daily   gentamicin cream  1 Application Topical Daily   heparin  5,000 Units Subcutaneous Q8H   insulin pump   Subcutaneous TID WC, HS, 0200   vancomycin variable dose per unstable renal function (pharmacist dosing)   Does not apply See admin instructions    Dialysis Orders: 7X Week, EDW 72 (kg) Ca 2.5 (mEq/L) Mg 0.5 (mEq/L), Dextrose 1.5; 2.5; 4.25 %, # Exchanges 5, fill vol 2450 mL, last fill vol 1200 mL    Assessment/Plan:  Recurrent peritonitis: Prev peritonitis as outpatient - PD fluid Cx 8/12 grew CoNS - treated with 1 week IP Vanc. Now with recurrent symptoms/abd pain - cell count on admit (9/11) was > 10K with predominant neutrophils. Back on Vanc - will need longer 3 week course. If infection still does not clear, may need PD cath removed. Ongoing pain - ?somewhat better - she feels well enough to leave. Repeating cell count today to assure lessening, and will check Vanc level - possibly can leave later today? L hemorrhage ovarian cyst: Incidental on CT - follow. May be contributing to abd pain.  ESRD: On PD. Usually does 4 exchanges and a pause, will do 5 exchanges overnight here, no pause. Slowly drainage here - will add heparin to bags tonight.  Hypertension/volume: BP fine - will plan for all 2.5% bags tonight - infection historically causing her to be a high transporter.  Anemia: Hgb to goal, no ESA for now.  Metabolic bone disease: Cca/Phos good, continue current  medications  Nutrition:  Alb 2.2, adding protein supplements  Dispo: Ok to discharge from renal standpoint AFTER repeat cell count and vanc level have resulted so we can make sure we have a good plan for her.  Ozzie Hoyle, PA-C 01/08/2023, 10:17 AM  [employer]

## 2023-01-08 NOTE — Progress Notes (Signed)
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  Recently completed course of peritoneal vancomycin now presents back again with abdominal pain.  CT scan concerning for possible colitis/enteritis.  EDP consulted nephrology.  Patient is 20L fluid was suggestive of infection therefore started initially on vancomycin, rifampin and Rocephin.  Eventually cultures grew gram-positive cocci.  Rocephin was discontinued.  If WBC improved today and hierperitoneal fluid, can be discharged   Assessment & Plan:  Principal Problem:   Peritoneal dialysis-associated peritonitis (HCC) Active Problems:   SBP (spontaneous bacterial peritonitis) (HCC)   Ovarian cyst    PD peritonitis  History of recurrent infection with staph epidermis.  Possible colonization/infection of the catheter.  Nephrology team is following.  Cultures are growing gram-positive cocci.  Discontinue Rocephin.  Continue vancomycin and rifampin.  If WBC improving peritoneal fluid, she will be discharged.  Patient anxiety much better today     ESRD on PD  Hypokalemia, asymptomatic, treating  Per nephrology   Ovarian cyst  Left ovarian hemorrhagic cyst noted on transvaginal ultrasound.  Recommend follow-up ultrasound 2 weeks after her menstruation.   Hypertension  Holding home meds.  IV as needed   Type 1 DM  Patient using on insulin pump.   DVT prophylaxis: heparin injection 5,000 Units Start: 01/06/23 2200 Code Status: Full code Family Communication:   Hopefully discharge later today             Subjective: No complaints, sitting up in the chair   Examination:  General exam: Appears calm and comfortable  Respiratory system: Clear to auscultation.  Respiratory effort normal. Cardiovascular system: S1 & S2 heard, RRR. No JVD, murmurs, rubs, gallops or clicks. No pedal edema. Gastrointestinal system: Abdomen is nondistended, soft and nontender. No organomegaly or masses felt. Normal bowel sounds heard. Central nervous system: Alert and oriented. No focal neurological deficits. Extremities: Symmetric 5 x 5 power. Skin: No rashes, lesions or ulcers Psychiatry: Judgement and insight appear normal. Mood & affect appropriate.       Diet Orders (From admission, onward)     Start     Ordered   01/07/23 0352  Diet regular Room service appropriate? Yes; Fluid consistency: Thin  Diet effective now       Question Answer Comment  Room service appropriate? Yes   Fluid consistency: Thin      01/07/23 0351            Objective: Vitals:   01/07/23 0318 01/07/23 0947 01/08/23 0600 01/08/23 0830  BP: 124/74 120/80 (!) 140/82 129/71  Pulse: 87 80 85 84  Resp: 17 18    Temp: 98.2 F (36.8 C) 98.2 F (36.8 C) 98.3 F (36.8 C) 98.6 F (37 C)  TempSrc: Oral Oral    SpO2: 100% 100% 100% 100%  Weight:      Height:        Intake/Output Summary (Last 24 hours) at 01/08/2023 1246 Last data filed at 01/08/2023 0902 Gross per 24 hour  Intake 120 ml  Output 525 ml  Net -405 ml   Filed Weights   01/06/23 1416 01/07/23 0317  Weight: 76 kg 78.1 kg    Scheduled Meds:  aspirin EC  81 mg Oral Daily  gentamicin cream  1 Application Topical Daily   heparin  5,000 Units Subcutaneous Q8H   insulin pump   Subcutaneous TID WC, HS, 0200   vancomycin variable dose per unstable renal function (pharmacist dosing)   Does not apply See admin instructions   Continuous Infusions:  dialysis solution 1.5% low-MG/low-CA Stopped (01/06/23 2002)   dialysis solution 1.5% low-MG/low-CA     rifampin (RIFADIN) 600 mg in sodium chloride 0.9 % 100 mL IVPB 600 mg (01/07/23 1624)    Nutritional status     Body mass index is 26.97 kg/m.  Data Reviewed:    CBC: Recent Labs  Lab 01/06/23 1418 01/07/23 0147  WBC 11.8* 9.9  HGB 12.6 10.9*  HCT 39.7 34.2*  MCV 90.8 88.1  PLT 405* 287   Basic Metabolic Panel: Recent Labs  Lab 01/06/23 1418 01/07/23 0147  NA 138 137  K 2.6* 3.7  CL 98 98  CO2 25 24  GLUCOSE 120* 89  BUN 33* 42*  CREATININE 11.75* 11.82*  CALCIUM 8.0* 7.7*  MG  --  1.8  PHOS  --  4.2   GFR: Estimated Creatinine Clearance: 6.9 mL/min (A) (by C-G formula based on SCr of 11.82 mg/dL (H)). Liver Function Tests: Recent Labs  Lab 01/06/23 1418  AST 26  ALT 22  ALKPHOS 180*  BILITOT 0.4  PROT 5.8*  ALBUMIN 2.5*   Recent Labs  Lab 01/06/23 1418  LIPASE 26   No results for input(s): "AMMONIA" in the last 168 hours. Coagulation Profile: No results for input(s): "INR", "PROTIME" in the last 168 hours. Cardiac Enzymes: No results for input(s): "CKTOTAL", "CKMB", "CKMBINDEX", "TROPONINI" in the last 168 hours. BNP (last 3 results) No results for input(s): "PROBNP" in the last 8760 hours. HbA1C: No results for input(s): "HGBA1C" in the last 72 hours. CBG: Recent Labs  Lab 01/07/23 0322  GLUCAP 94   Lipid Profile: No results for input(s): "CHOL", "HDL", "LDLCALC", "TRIG", "CHOLHDL", "LDLDIRECT" in the last 72 hours. Thyroid Function Tests: No results for input(s): "TSH", "T4TOTAL", "FREET4", "T3FREE", "THYROIDAB" in the last 72 hours. Anemia Panel: No results for input(s): "VITAMINB12", "FOLATE", "FERRITIN", "TIBC", "IRON", "RETICCTPCT" in the last 72 hours. Sepsis Labs: Recent Labs  Lab 01/06/23 1622 01/06/23 2047  LATICACIDVEN 1.2 1.3    Recent Results (from the past 240 hour(s))  Culture, body fluid w Gram Stain-bottle     Status: None (Preliminary result)   Collection Time: 01/06/23  2:53 PM   Specimen: Fluid  Result Value Ref Range Status   Specimen Description FLUID PERITONEAL DIALYSIS  Final   Special Requests BOTTLES DRAWN AEROBIC AND ANAEROBIC  Final   Gram Stain   Final    GRAM  POSITIVE COCCI IN CLUSTERS ANAEROBIC BOTTLE ONLY CRITICAL RESULT CALLED TO, READ BACK BY AND VERIFIED WITH: RN Deirdre Peer  [id number]  [id number] FCP Performed at [facility], [address] [place], [place] [address]    Culture GRAM POSITIVE COCCI  Final   Report Status PENDING  Incomplete  Gram stain     Status: None   Collection Time: 01/06/23  2:53 PM   Specimen: Fluid  Result Value Ref Range Status   Specimen Description FLUID PERITONEAL DIALYSIS  Final   Special Requests NONE  Final   Gram Stain   Final    MODERATE WBC PRESENT, PREDOMINANTLY PMN RARE INTRACELLULAR GRAM POSITIVE COCCI CRITICAL RESULT CALLED TO, READ BACK BY AND VERIFIED WITH: RN NADIA PATTERSON ON 01/06/23 @ 1951 BY DRT  Performed at [facility], [address], [place], [place] [address]    Report Status 01/06/2023 FINAL  Final         Radiology Studies: US PELVIS TRANSVAGINAL NON-OB (TV ONLY)  Result Date: 01/06/2023 CLINICAL DATA:  Left ovarian complex cyst seen on the earlier CT. EXAM: TRANSVAGINAL ULTRASOUND OF PELVIS DOPPLER ULTRASOUND OF OVARIES TECHNIQUE: Transvaginal ultrasound examination of the pelvis was performed including evaluation of the uterus, ovaries, adnexal regions, and pelvic cul-de-sac. Color and duplex Doppler ultrasound was utilized to evaluate blood flow to the ovaries. COMPARISON:  CT abdomen pelvis dated 01/06/2023. FINDINGS: Uterus Measurements: 7.6 x 4.3 x 5.0 cm = volume: 85 mL. No fibroids or other mass visualized. Endometrium Thickness: 6 mm.  No focal abnormality visualized. Right ovary Measurements: 2.4 x 1.3 x 1.4 cm = volume: 2.3 mL. A 1.6 cm right ovarian cyst. Left ovary Measurements: 4.1 x 3.2 x 3.7 cm = volume: 25 ML. There is a 4.1 x 3.2 x 3.7 cm complex cyst with lace-like internal architecture and no internal vascularity in the right ovary most consistent with a hemorrhagic cyst. Pulsed Doppler evaluation demonstrates normal low-resistance arterial and venous waveforms in  both ovaries. Other findings:  No abnormal free fluid IMPRESSION: Left ovarian hemorrhagic cyst. Follow-up with ultrasound after 2 menstrual cycles recommended. Electronically Signed   By: Elgie Collard M.D.   On: 01/06/2023 22:44   US PELVIC DOPPLER (TORSION R/O OR MASS ARTERIAL FLOW)  Result Date: 01/06/2023 CLINICAL DATA:  Left ovarian complex cyst seen on the earlier CT. EXAM: TRANSVAGINAL ULTRASOUND OF PELVIS DOPPLER ULTRASOUND OF OVARIES TECHNIQUE: Transvaginal ultrasound examination of the pelvis was performed including evaluation of the uterus, ovaries, adnexal regions, and pelvic cul-de-sac. Color and duplex Doppler ultrasound was utilized to evaluate blood flow to the ovaries. COMPARISON:  CT abdomen pelvis dated 01/06/2023. FINDINGS: Uterus Measurements: 7.6 x 4.3 x 5.0 cm = volume: 85 mL. No fibroids or other mass visualized. Endometrium Thickness: 6 mm.  No focal abnormality visualized. Right ovary Measurements: 2.4 x 1.3 x 1.4 cm = volume: 2.3 mL. A 1.6 cm right ovarian cyst. Left ovary Measurements: 4.1 x 3.2 x 3.7 cm = volume: 25 ML. There is a 4.1 x 3.2 x 3.7 cm complex cyst with lace-like internal architecture and no internal vascularity in the right ovary most consistent with a hemorrhagic cyst. Pulsed Doppler evaluation demonstrates normal low-resistance arterial and venous waveforms in both ovaries. Other findings:  No abnormal free fluid IMPRESSION: Left ovarian hemorrhagic cyst. Follow-up with ultrasound after 2 menstrual cycles recommended. Electronically Signed   By: Elgie Collard M.D.   On: 01/06/2023 22:44   CT ABDOMEN PELVIS WO CONTRAST  Result Date: 01/06/2023 CLINICAL DATA:  Generalized abdominal pain. Peritoneal dialysis patient. EXAM: CT ABDOMEN AND PELVIS WITHOUT CONTRAST TECHNIQUE: Multidetector CT imaging of the abdomen and pelvis was performed following the standard protocol without IV contrast. RADIATION DOSE REDUCTION: This exam was performed according to the  departmental dose-optimization program which includes automated exposure control, adjustment of the mA and/or kV according to patient size and/or use of iterative reconstruction technique. COMPARISON:  CT 01/01/2023 and older FINDINGS: Lower chest: There is some linear opacity lung bases likely scar or atelectasis. No pleural effusion. Coronary artery calcifications are seen. Hepatobiliary: On this non IV contrast exam, grossly preserved hepatic parenchyma. There is high density material in the distended gallbladder. This could be vicarious excretion of previous contrast material. Pancreas: Mild atrophy of the pancreas. Spleen: Normal in  size without focal abnormality. Adrenals/Urinary Tract: The adrenal glands are preserved. Severe atrophy of the kidneys. The calcifications along the kidneys may be more vascular than collecting system. No collecting system dilatation. The ureters have normal course and caliber extending down to the bladder. There is some high attenuation material in the bladder. Please correlate previous contrast. The bladder is underdistended. Stomach/Bowel: On this non oral contrast exam the large bowel has a normal course and caliber. There are areas of wall thickening along the colon particularly along the ascending colon. Mild along the transverse colon. This has new from previous. Please correlate for developing colitis. Surgical changes in the area of the base of the cecum in the appendix is not well seen. Stomach has some luminal fluid and is nondilated. Small bowel is nondilated. Question some areas of small-bowel wall thickening. Vascular/Lymphatic: Diffuse vascular calcifications are identified. Normal caliber aorta and IVC. No discrete abnormal lymph node enlargement identified in the abdomen and pelvis. Reproductive: Uterus is present. There is new cystic lesion in the left adnexa measuring 4.0 x 3.1 cm. This was much smaller 5 days ago measuring 18 mm. Likely a functional cyst with  the time course. However please correlate with any particular symptoms and if there is concern of torsion recommend additional workup with ultrasound. Other: Peritoneal dialysis catheter in place. Increasing free air and fluid. Free air would has a differential but could be related to the peritoneal dialysis. Bowel perforation would be difficult to exclude in principle Musculoskeletal: There is diffuse sclerosis and trabecular thickening of the left hemipelvis. Unchanged from previous. Please correlate for any history including Paget's disease. IMPRESSION: Scattered free air and free fluid increased from the study of January 01 2023, however the patient is with a peritoneal dialysis catheter. Developing 4 cm left adnexal cystic structure, likely ovarian. With the time course favor a functional cyst. Please correlate with any particular symptoms and if there is concern of torsion a follow up ultrasound may be useful as the next step. Wall thickening along the right side of the colon. Questionable areas of thickening along the small bowel. Please correlate for any clinical evidence of a colitis or enteritis. Atrophic native kidneys.  Diffuse vascular calcifications. Electronically Signed   By: Karen Kays M.D.   On: 01/06/2023 17:47           LOS: 1 day   Time spent= 35 mins    Miguel Rota, MD [facility]  If 7PM-7AM, please contact night-coverage  01/08/2023, 12:46 PM

## 2023-01-08 NOTE — Hospital Course (Addendum)
  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  Recently completed course of peritoneal vancomycin now presents back again with abdominal pain.  CT scan concerning for possible colitis/enteritis.  EDP consulted nephrology.  Patient is 20L fluid was suggestive of infection therefore started initially on vancomycin, rifampin and Rocephin.  Eventually cultures grew gram-positive cocci.  Rocephin was discontinued.  Today patient is doing significantly well.  Will discharge her on p.o. rifampin, PD vancomycin to be arranged by nephrology team.  Pain medication and bowel regimen prescribed.   Assessment & Plan:  Principal Problem:   Peritoneal dialysis-associated peritonitis (HCC) Active Problems:   SBP (spontaneous bacterial peritonitis) (HCC)   Ovarian cyst    PD peritonitis  History of recurrent infection with staph epidermis.  Possible colonization/infection of the catheter.  Nephrology team is following.  Cultures are growing gram-positive cocci.  Discontinue Rocephin.  Continue vancomycin and rifampin.  If WBC improving peritoneal fluid, she will be discharged.  Patient anxiety much better today     ESRD on PD  Hypokalemia, asymptomatic, treating  Per nephrology   Ovarian cyst  Left ovarian hemorrhagic cyst noted on transvaginal ultrasound.  Recommend follow-up ultrasound 2 weeks after her menstruation.   Hypertension  Home regimen   Type 1 DM  Patient using on insulin pump.   DVT prophylaxis: heparin injection 5,000 Units Start: 01/06/23 2200 Code Status: Full code Family Communication:   Discharge today

## 2023-01-08 NOTE — Plan of Care (Signed)

## 2023-01-08 NOTE — Procedures (Signed)
PD tx initation note:   PD treatment initiated via aseptic technique. Consent signed and in chart. Patient is alert and oriented. No complaints of pain. No specimen collected. PD exit site clean, dry and intact. Gentamycin and new dressing applied. Bedside RN educated on PD machine and how to contact tech support when PD machine alarms.

## 2023-01-09 ENCOUNTER — Other Ambulatory Visit (HOSPITAL_COMMUNITY): Payer: Self-pay

## 2023-01-09 DIAGNOSIS — T8571XD Infection and inflammatory reaction due to peritoneal dialysis catheter, subsequent encounter: Secondary | ICD-10-CM | POA: Diagnosis not present

## 2023-01-09 LAB — CBC
HCT: 32 % — ABNORMAL LOW (ref 36.0–46.0)
Hemoglobin: 10.5 g/dL — ABNORMAL LOW (ref 12.0–15.0)
MCH: 29.2 pg (ref 26.0–34.0)
MCHC: 32.8 g/dL (ref 30.0–36.0)
MCV: 88.9 fL (ref 80.0–100.0)
Platelets: 298 10*3/uL (ref 150–400)
RBC: 3.6 MIL/uL — ABNORMAL LOW (ref 3.87–5.11)
RDW: 13.1 % (ref 11.5–15.5)
WBC: 3.8 10*3/uL — ABNORMAL LOW (ref 4.0–10.5)
nRBC: 0 % (ref 0.0–0.2)

## 2023-01-09 LAB — BASIC METABOLIC PANEL
Anion gap: 12 (ref 5–15)
BUN: 31 mg/dL — ABNORMAL HIGH (ref 6–20)
CO2: 25 mmol/L (ref 22–32)
Calcium: 7.8 mg/dL — ABNORMAL LOW (ref 8.9–10.3)
Chloride: 99 mmol/L (ref 98–111)
Creatinine, Ser: 9.78 mg/dL — ABNORMAL HIGH (ref 0.44–1.00)
GFR, Estimated: 5 mL/min — ABNORMAL LOW (ref >60)
Glucose, Bld: 130 mg/dL — ABNORMAL HIGH (ref 70–99)
Potassium: 2.8 mmol/L — ABNORMAL LOW (ref 3.5–5.1)
Sodium: 136 mmol/L (ref 135–145)

## 2023-01-09 LAB — CULTURE, BODY FLUID W GRAM STAIN -BOTTLE

## 2023-01-09 LAB — MAGNESIUM: Magnesium: 1.7 mg/dL (ref 1.7–2.4)

## 2023-01-09 LAB — VANCOMYCIN, RANDOM: Vancomycin Rm: 27 ug/mL

## 2023-01-09 MED ORDER — RIFAMPIN 300 MG PO CAPS
600.0000 mg | ORAL_CAPSULE | Freq: Every day | ORAL | 0 refills | Status: DC
  Filled 2023-01-09: qty 60, 30d supply, fill #0

## 2023-01-09 MED ORDER — POTASSIUM CHLORIDE CRYS ER 20 MEQ PO TBCR
40.0000 meq | EXTENDED_RELEASE_TABLET | Freq: Once | ORAL | Status: AC
  Administered 2023-01-09: 40 meq via ORAL
  Filled 2023-01-09: qty 2

## 2023-01-09 MED ORDER — OXYCODONE HCL 5 MG PO TABS
5.0000 mg | ORAL_TABLET | Freq: Four times a day (QID) | ORAL | 0 refills | Status: DC | PRN
Start: 2023-01-09 — End: 2023-02-22
  Filled 2023-01-09: qty 30, 7d supply, fill #0

## 2023-01-09 MED ORDER — SENNOSIDES-DOCUSATE SODIUM 8.6-50 MG PO TABS
1.0000 | ORAL_TABLET | Freq: Every evening | ORAL | 0 refills | Status: DC | PRN
  Filled 2023-01-09: qty 30, 30d supply, fill #0

## 2023-01-09 MED ORDER — RIFAMPIN 300 MG PO CAPS
600.0000 mg | ORAL_CAPSULE | Freq: Every day | ORAL | 0 refills | Status: AC
  Filled 2023-01-09: qty 42, 21d supply, fill #0

## 2023-01-09 NOTE — Progress Notes (Signed)
[facility] Progress Note   Subjective: Seen in room - feels much better, abd much less tender. No issues with PD overnight. K low this AM - will supplement before she leaves. Plan is to go home today. Abx being arranged. We discussed instructions for her - see plan below.  Objective Vitals:   01/08/23 0830 01/08/23 1607 01/08/23 1627 01/08/23 2019  BP: 129/71 (!) 141/92 (!) 150/84 131/65  Pulse: 84 91  84  Resp:  19  18  Temp: 98.6 F (37 C) 98.7 F (37.1 C)  99 F (37.2 C)  TempSrc:    Oral  SpO2: 100% 100%  100%  Weight:      Height:       Physical Exam General: Well appearing woman, NAD Heart: RRR; no murmur Lungs: CTAB; no rales Abdomen: soft, non-tender Extremities: no LE edema Dialysis Access: PD cath  Additional Objective Labs: Basic Metabolic Panel: Recent Labs  Lab 01/07/23 0147 01/08/23 1826 01/09/23 0451  NA 137 134* 136  K 3.7 3.0* 2.8*  CL 98 96* 99  CO2 24 26 25   GLUCOSE 89 182* 130*  BUN 42* 37* 31*  CREATININE 11.82* 10.89* 9.78*  CALCIUM 7.7* 7.6* 7.8*  PHOS 4.2  --   --    Liver Function Tests: Recent Labs  Lab 01/06/23 1418  AST 26  ALT 22  ALKPHOS 180*  BILITOT 0.4  PROT 5.8*  ALBUMIN 2.5*   Recent Labs  Lab 01/06/23 1418  LIPASE 26   CBC: Recent Labs  Lab 01/06/23 1418 01/07/23 0147 01/08/23 1826 01/09/23 0451  WBC 11.8* 9.9 5.0 3.8*  HGB 12.6 10.9* 11.7* 10.5*  HCT 39.7 34.2* 36.1 32.0*  MCV 90.8 88.1 90.3 88.9  PLT 405* 287 307 298   Blood Culture    Component Value Date/Time   SDES FLUID PERITONEAL DIALYSIS 01/06/2023 1453   SDES FLUID PERITONEAL DIALYSIS 01/06/2023 1453   SPECREQUEST BOTTLES DRAWN AEROBIC AND ANAEROBIC 01/06/2023 1453   SPECREQUEST NONE 01/06/2023 1453   CULT STAPHYLOCOCCUS EPIDERMIDIS (A) 01/06/2023 1453   REPTSTATUS 01/09/2023 FINAL 01/06/2023 1453   REPTSTATUS 01/06/2023 FINAL 01/06/2023 1453   Medications:  dialysis solution 1.5% low-MG/low-CA Stopped (01/06/23 2002)    dialysis solution 1.5% low-MG/low-CA     rifampin (RIFADIN) 600 mg in sodium chloride 0.9 % 100 mL IVPB 600 mg (01/08/23 1642)    aspirin EC  81 mg Oral Daily   gentamicin cream  1 Application Topical Daily   heparin  5,000 Units Subcutaneous Q8H   insulin pump   Subcutaneous TID WC, HS, 0200   potassium chloride  40 mEq Oral Once   vancomycin variable dose per unstable renal function (pharmacist dosing)   Does not apply See admin instructions    Dialysis Orders: 7X Week, EDW 72 (kg) Ca 2.5 (mEq/L) Mg 0.5 (mEq/L), Dextrose 1.5; 2.5; 4.25 %, # Exchanges 5, fill vol 2450 mL, last fill vol 1200 mL    Assessment/Plan:  Recurrent peritonitis: Prev peritonitis as outpatient - PD fluid Cx 8/12 grew Staph Epi - treated with 1 week IP Vanc. Now with recurrent symptoms/abd pain - cell count on admit (9/11) was > 10K with predominant neutrophils. Back on Vanc (and rifampin) - will need longer 3 week course. Repeat cell count 9/13 MUCH better, pain nearly resolved. L hemorrhage ovarian cyst: Incidental on CT - follow. Will need outpatient f/u. ESRD: On PD. Usually does 4 exchanges and a pause, have been doing 5 exchanges overnight here, no pause.  Heparin added 9/13. Hypertension/volume: BP fine - using 2.5% bags tonight - infection historically causing her to be a high transporter. Anemia: Hgb to goal, no ESA for now. Metabolic bone disease: CCa/Phos good, continue current medications Nutrition:  Alb 2.2, adding protein supplements  Hypokalemia: Had been eating poorly due to abd pain - improving. Will supp today. Dispo: Ok to discharge from renal standpoint, reviewed the following with patient today. ABX: Will be on 3 week course of PO Rifampin 600mg  and IV Vanc. Vanc level 9/13 was 27, plan is to redose when under 20. She will go to the PD office on 9/16 for level and likely re-dose at that time HypoK: KCL supp today - rec to ^ K in diet, will repeat K level on 9/16 for her PD Rx: Resume prior PD  orders - all 2.5% tonight with heparin - she understands   Ozzie Hoyle, PA-C 01/09/2023, 9:21 AM  [employer]

## 2023-01-09 NOTE — Progress Notes (Signed)
PD post treatment note  PD treatment completed. Patient tolerated treatment well. PD effluent is clear. No specimen collected.  PD exit site clean, dry and intact. Patient is awake, oriented and in no acute distress.  Report given to bedside nurse.   Post treatment VS: WNL---see flowsheet  Total UF removed:   Post treatment weight: PD post treatment note

## 2023-01-09 NOTE — Progress Notes (Signed)
Pharmacy Antibiotic Note  Kelly Huff is a 37 y.o. female admitted on 01/06/2023 presenting with recurrent peritonitis. Pharmacy has been consulted for vancomycin and rifampin dosing.  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  Vancomycin random level this AM is 27 mcg/ml. Goal is to redose when <20 mcg/ml. Peritoneal fluid culture from 9/11 returned CNS with sensitivities pending.    Plan: Continue Vancomycin. Redose when level <20  Continue Rifampin 600 mg IV once daily Monitor PD therapy, Cx and clinical progression to narrow Vancomycin random level in 48 hours to dictate further dosing  Height: 5\' 7"  (170.2 cm) Weight: 78.1 kg (172 lb 2.9 oz) IBW/kg (Calculated) : 61.6  Temp (24hrs), Avg:98.8 F (37.1 C), Min:98.6 F (37 C), Max:99 F (37.2 C)  Recent Labs  Lab 01/06/23 1418 01/06/23 1622 01/06/23 2047 01/07/23 0147 01/08/23 1826 01/09/23 0451  WBC 11.8*  --   --  9.9 5.0 3.8*  CREATININE 11.75*  --   --  11.82* 10.89* 9.78*  LATICACIDVEN  --  1.2 1.3  --   --   --   VANCORANDOM  --   --   --   --  35 27    Estimated Creatinine Clearance: 8.4 mL/min (A) (by C-G formula based on SCr of 9.78 mg/dL (H)).    Allergies  Allergen Reactions   Morphine Itching and Other (See Comments)    Hallucinations, also   Peanut Oil Swelling and Other (See Comments)    Tongue swelling as a child   Sulfamethoxazole-Trimethoprim Hives and Other (See Comments)   Covid-19 (Mrna) Vaccine Other (See Comments)    Was told to not take this again   Trulicity [Dulaglutide] Other (See Comments)    She is DM Type 1. Was prescribed but damaged her kidneys    Clary Meeker A. Jeanella Craze, PharmD, BCPS, FNKF Clinical Pharmacist [place] Please utilize Amion for appropriate phone number to reach the unit pharmacist [facility])  01/09/2023 7:06 AM

## 2023-01-09 NOTE — Progress Notes (Signed)
DISCHARGE NOTE HOME Kelly Huff to be discharged Home per MD order. Discussed prescriptions and follow up appointments with the patient. Prescriptions given to patient; medication list explained in detail. Patient verbalized understanding.  Skin clean, dry and intact without evidence of skin break down, no evidence of skin tears noted. IV catheter discontinued intact. Site without signs and symptoms of complications. Dressing and pressure applied. Pt denies pain at the site currently. No complaints noted.  Patient free of lines, drains, and wounds. PD catheter C/D/I.  An After Visit Summary (AVS) was printed and given to the patient. Patient escorted via wheelchair, and discharged home via private auto.  Margarita Grizzle, RN

## 2023-01-09 NOTE — Discharge Summary (Signed)
Physician Discharge Summary  [place] SCREEN NGE:[id number] DOB: 09/05/1985 DOA: 01/06/2023  PCP: Alain Marion, MD  Admit date: 01/06/2023 Discharge date: 01/09/2023  Admitted From: Home Disposition: Home  Recommendations for Outpatient Follow-up:  Follow up with PCP in 1-2 weeks Please obtain BMP/CBC in one week your next doctors visit.  Pain medication with bowel regimen. Will prescribe 3 weeks of rifampin.  Nephrology will arrange for 3 weeks of intraperitoneal vancomycin.   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  Recently completed course of peritoneal vancomycin now presents back again with abdominal pain.  CT scan concerning for possible colitis/enteritis.  EDP consulted nephrology.  Patient is 20L fluid was suggestive of infection therefore started initially on vancomycin, rifampin and Rocephin.  Eventually cultures grew gram-positive cocci.  Rocephin was discontinued.  Today patient is doing significantly well.  Will discharge her on p.o. rifampin, PD vancomycin to be arranged by nephrology team.  Pain medication and bowel regimen prescribed.   Assessment & Plan:  Principal Problem:   Peritoneal dialysis-associated peritonitis (HCC) Active Problems:   SBP (spontaneous bacterial peritonitis) (HCC)   Ovarian cyst    PD peritonitis  History of recurrent infection with staph epidermis.  Possible colonization/infection of the catheter.  Nephrology team is following.  Cultures are growing gram-positive cocci.  Discontinue Rocephin.  Continue vancomycin and rifampin.  If WBC improving peritoneal fluid, she will be discharged.  Patient anxiety much better today     ESRD on PD  Hypokalemia, asymptomatic,  treating  Per nephrology   Ovarian cyst  Left ovarian hemorrhagic cyst noted on transvaginal ultrasound.  Recommend follow-up ultrasound 2 weeks after her menstruation.   Hypertension  Home regimen   Type 1 DM  Patient using on insulin pump.   DVT prophylaxis: heparin injection 5,000 Units Start: 01/06/23 2200 Code Status: Full code Family Communication:   Discharge today     Discharge Diagnoses:  Principal Problem:   Peritoneal dialysis-associated peritonitis (HCC) Active Problems:   SBP (spontaneous bacterial peritonitis) (HCC)   Ovarian cyst   Peritonsillitis      Consultations: Nephrology  Subjective: Feels well no complaints at this time.  Her peritoneal cultures have grown Staphylococcus epidermis  Discharge Exam: Vitals:   01/08/23 1627 01/08/23 2019  BP: (!) 150/84 131/65  Pulse:  84  Resp:  18  Temp:  99 F (37.2 C)  SpO2:  100%   Vitals:   01/08/23 1607 01/08/23 1627 01/08/23 2019 01/09/23 1014  BP: (!) 141/92 (!) 150/84 131/65   Pulse: 91  84   Resp: 19  18   Temp: 98.7 F (37.1 C)  99 F (37.2 C)   TempSrc:   Oral   SpO2: 100%  100%   Weight:    78.4 kg  Height:        General: Pt is alert, awake, not in acute distress Cardiovascular: RRR, S1/S2 +, no rubs, no gallops Respiratory: CTA bilaterally, no wheezing, no rhonchi Abdominal: Soft, NT, ND, bowel sounds + Extremities: no edema, no cyanosis  Discharge Instructions   Allergies as of 01/09/2023       Reactions   Morphine Itching, Other (See Comments)   Hallucinations, also   Peanut Oil Swelling, Other (See Comments)   Tongue swelling  as a child   Sulfamethoxazole-trimethoprim Hives, Other (See Comments)   Covid-19 (mrna) Vaccine Other (See Comments)   Was told to not take this again   Trulicity [dulaglutide] Other (See Comments)   She is DM Type 1. Was prescribed but damaged her kidneys        Medication List     TAKE these medications    acetaminophen 325 MG  tablet Commonly known as: TYLENOL Take 2 tablets (650 mg total) by mouth every 4 (four) hours as needed for mild pain or fever.   amLODipine 10 MG tablet Commonly known as: NORVASC Take 10 mg by mouth in the morning.   aspirin EC 81 MG tablet Take 81 mg by mouth daily. Swallow whole.   Auryxia 1 GM 210 MG(Fe) tablet Generic drug: ferric citrate Take 210-630 mg by mouth See admin instructions. Take 630 mg by mouth three times a day with meals and 210 mg with each snack   clindamycin-benzoyl peroxide gel Commonly known as: BENZACLIN Apply 1 Application topically in the morning.   cyclobenzaprine 10 MG tablet Commonly known as: FLEXERIL Take 10 mg by mouth 3 (three) times daily as needed for muscle spasms.   Dexcom G6 Sensor Misc Inject 1 Device into the skin See admin instructions. Place 1 new device into the skin every 10 days   insulin lispro 100 UNIT/ML injection Commonly known as: HUMALOG Continue use of insulin pump with setting as previous: Restart in the morning of 03/23/2020 as Lantus was given on the morning of 03/22/2020.   Lantus SoloStar 100 UNIT/ML Solostar Pen Generic drug: insulin glargine Inject into the skin See admin instructions. Use as directed for insulin pump failure   medroxyPROGESTERone 10 MG tablet Commonly known as: PROVERA Take 10 mg by mouth See admin instructions. Take 10 mg by mouth on days 1-10 of each month   metoprolol succinate 100 MG 24 hr tablet Commonly known as: TOPROL-XL Take 100 mg by mouth daily. Take with or immediately following a meal.   NEPHRO-VITE PO Take 1 tablet by mouth 3 (three) times a week.   Omnipod 5 G6 Pods (Gen 5) Misc Inject 1 Device into the skin every 3 (three) days.   oxyCODONE 5 MG immediate release tablet Commonly known as: Oxy IR/ROXICODONE Take 1 tablet (5 mg total) by mouth every 6 (six) hours as needed for severe pain.   rifampin 300 MG capsule Commonly known as: Rifadin Take 2 capsules (600 mg  total) by mouth daily for 21 days.   Stool Softener/Laxative 50-8.6 MG tablet Generic drug: senna-docusate Take 1 tablet by mouth at bedtime as needed for moderate constipation.   traZODone 50 MG tablet Commonly known as: DESYREL Take 50 mg by mouth at bedtime as needed for sleep.   tretinoin 0.05 % cream Commonly known as: RETIN-A Apply 1 application  topically See admin instructions. Apply as directed daily- affected areas        Follow-up Information     Alain Marion, MD Follow up in 1 week(s).   Specialty: Family Medicine Contact information: [address] [place] [address] [phone]                Allergies  Allergen Reactions   Morphine Itching and Other (See Comments)    Hallucinations, also   Peanut Oil Swelling and Other (See Comments)    Tongue swelling as a child   Sulfamethoxazole-Trimethoprim Hives and Other (See Comments)   Covid-19 (Mrna) Vaccine Other (See Comments)  Was told to not take this again   Trulicity [Dulaglutide] Other (See Comments)    She is DM Type 1. Was prescribed but damaged her kidneys    You were cared for by a hospitalist during your hospital stay. If you have any questions about your discharge medications or the care you received while you were in the hospital after you are discharged, you can call the unit and asked to speak with the hospitalist on call if the hospitalist that took care of you is not available. Once you are discharged, your primary care physician will handle any further medical issues. Please note that no refills for any discharge medications will be authorized once you are discharged, as it is imperative that you return to your primary care physician (or establish a relationship with a primary care physician if you do not have one) for your aftercare needs so that they can reassess your need for medications and monitor your lab values.  You were cared for by a hospitalist during your hospital  stay. If you have any questions about your discharge medications or the care you received while you were in the hospital after you are discharged, you can call the unit and asked to speak with the hospitalist on call if the hospitalist that took care of you is not available. Once you are discharged, your primary care physician will handle any further medical issues. Please note that NO REFILLS for any discharge medications will be authorized once you are discharged, as it is imperative that you return to your primary care physician (or establish a relationship with a primary care physician if you do not have one) for your aftercare needs so that they can reassess your need for medications and monitor your lab values.  Please request your Prim.MD to go over all Hospital Tests and Procedure/Radiological results at the follow up, please get all Hospital records sent to your Prim MD by signing hospital release before you go home.  Get CBC, CMP, 2 view Chest X ray checked  by Primary MD during your next visit or SNF MD in 5-7 days ( we routinely change or add medications that can affect your baseline labs and fluid status, therefore we recommend that you get the mentioned basic workup next visit with your PCP, your PCP may decide not to get them or add new tests based on their clinical decision)  On your next visit with your primary care physician please Get Medicines reviewed and adjusted.  If you experience worsening of your admission symptoms, develop shortness of breath, life threatening emergency, suicidal or homicidal thoughts you must seek medical attention immediately by calling 911 or calling your MD immediately  if symptoms less severe.  You Must read complete instructions/literature along with all the possible adverse reactions/side effects for all the Medicines you take and that have been prescribed to you. Take any new Medicines after you have completely understood and accpet all the possible  adverse reactions/side effects.   Do not drive, operate heavy machinery, perform activities at heights, swimming or participation in water activities or provide baby sitting services if your were admitted for syncope or siezures until you have seen by Primary MD or a Neurologist and advised to do so again.  Do not drive when taking Pain medications.   Procedures/Studies: CT ABDOMEN PELVIS W CONTRAST  Result Date: 01/07/2023 CLINICAL DATA:  Peritonitis EXAM: CT ABDOMEN AND PELVIS WITH CONTRAST TECHNIQUE: Multidetector CT imaging of the abdomen and pelvis was performed  using the standard protocol following bolus administration of intravenous contrast. RADIATION DOSE REDUCTION: This exam was performed according to the departmental dose-optimization program which includes automated exposure control, adjustment of the mA and/or kV according to patient size and/or use of iterative reconstruction technique. CONTRAST:  ISOVUE-300 IOPAMIDOL (ISOVUE-300) INJECTION 61% COMPARISON:  11/01/2022 CT FINDINGS: Lower chest: There is some linear opacity seen along bases likely scar or atelectasis. No pleural effusion. Hepatobiliary: No focal liver abnormality is seen. No gallstones, gallbladder wall thickening, or biliary dilatation. Patent portal vein. Pancreas: Unremarkable. No pancreatic ductal dilatation or surrounding inflammatory changes. Spleen: Normal in size without focal abnormality. Adrenals/Urinary Tract: Adrenal glands are preserved. There is severe atrophy of the kidneys consistent with known history of chronic renal failure. No collecting system dilatation. Ureters have normal course and caliber extending down to the bladder slightly complex cystic lesion along lower pole of the left kidney measuring 9 mm. Hounsfield unit of 22 on today's examination. On the prior Hounsfield units of 11. No clear abnormal enhancement. Bosniak 2 lesion. No specific imaging follow-up bladder is relatively contracted.  Stomach/Bowel: There is some oral contrast in the stomach and small bowel. These structures are nondilated. Surgical changes in the area of the base of the cecum. The appendix is also not seen. There is moderate diffuse colonic stool. Vascular/Lymphatic: Scattered vascular calcifications identified particularly of the medial and small vessels. Normal caliber aorta and IVC. No specific abnormal lymph node enlargement identified in the abdomen and pelvis. Reproductive: Uterus and bilateral adnexa are unremarkable. Other: Peritoneal dialysis catheter in place. The pigtail is seen in the central pelvis above the bladder. Scattered ascites and a few areas of free air identified which in principle nonspecific but could relate to the patient's peritoneal dialysis. Musculoskeletal: Mild degenerative changes. There is diffuse trabecular thickening and sclerosis involving the left hemipelvis. This has seen previously. Again this could be related to etiology such as Paget's disease IMPRESSION: Peritoneal dialysis catheter with some scattered free air or free fluid. No clear areas of rim enhancing fluid collections. No bowel obstruction.  Moderate colonic stool. Electronically Signed   By: Karen Kays M.D.   On: 01/07/2023 10:03   US PELVIS TRANSVAGINAL NON-OB (TV ONLY)  Result Date: 01/06/2023 CLINICAL DATA:  Left ovarian complex cyst seen on the earlier CT. EXAM: TRANSVAGINAL ULTRASOUND OF PELVIS DOPPLER ULTRASOUND OF OVARIES TECHNIQUE: Transvaginal ultrasound examination of the pelvis was performed including evaluation of the uterus, ovaries, adnexal regions, and pelvic cul-de-sac. Color and duplex Doppler ultrasound was utilized to evaluate blood flow to the ovaries. COMPARISON:  CT abdomen pelvis dated 01/06/2023. FINDINGS: Uterus Measurements: 7.6 x 4.3 x 5.0 cm = volume: 85 mL. No fibroids or other mass visualized. Endometrium Thickness: 6 mm.  No focal abnormality visualized. Right ovary Measurements: 2.4 x 1.3 x  1.4 cm = volume: 2.3 mL. A 1.6 cm right ovarian cyst. Left ovary Measurements: 4.1 x 3.2 x 3.7 cm = volume: 25 ML. There is a 4.1 x 3.2 x 3.7 cm complex cyst with lace-like internal architecture and no internal vascularity in the right ovary most consistent with a hemorrhagic cyst. Pulsed Doppler evaluation demonstrates normal low-resistance arterial and venous waveforms in both ovaries. Other findings:  No abnormal free fluid IMPRESSION: Left ovarian hemorrhagic cyst. Follow-up with ultrasound after 2 menstrual cycles recommended. Electronically Signed   By: Elgie Collard M.D.   On: 01/06/2023 22:44   US PELVIC DOPPLER (TORSION R/O OR MASS ARTERIAL FLOW)  Result Date: 01/06/2023  CLINICAL DATA:  Left ovarian complex cyst seen on the earlier CT. EXAM: TRANSVAGINAL ULTRASOUND OF PELVIS DOPPLER ULTRASOUND OF OVARIES TECHNIQUE: Transvaginal ultrasound examination of the pelvis was performed including evaluation of the uterus, ovaries, adnexal regions, and pelvic cul-de-sac. Color and duplex Doppler ultrasound was utilized to evaluate blood flow to the ovaries. COMPARISON:  CT abdomen pelvis dated 01/06/2023. FINDINGS: Uterus Measurements: 7.6 x 4.3 x 5.0 cm = volume: 85 mL. No fibroids or other mass visualized. Endometrium Thickness: 6 mm.  No focal abnormality visualized. Right ovary Measurements: 2.4 x 1.3 x 1.4 cm = volume: 2.3 mL. A 1.6 cm right ovarian cyst. Left ovary Measurements: 4.1 x 3.2 x 3.7 cm = volume: 25 ML. There is a 4.1 x 3.2 x 3.7 cm complex cyst with lace-like internal architecture and no internal vascularity in the right ovary most consistent with a hemorrhagic cyst. Pulsed Doppler evaluation demonstrates normal low-resistance arterial and venous waveforms in both ovaries. Other findings:  No abnormal free fluid IMPRESSION: Left ovarian hemorrhagic cyst. Follow-up with ultrasound after 2 menstrual cycles recommended. Electronically Signed   By: Elgie Collard M.D.   On: 01/06/2023 22:44    CT ABDOMEN PELVIS WO CONTRAST  Result Date: 01/06/2023 CLINICAL DATA:  Generalized abdominal pain. Peritoneal dialysis patient. EXAM: CT ABDOMEN AND PELVIS WITHOUT CONTRAST TECHNIQUE: Multidetector CT imaging of the abdomen and pelvis was performed following the standard protocol without IV contrast. RADIATION DOSE REDUCTION: This exam was performed according to the departmental dose-optimization program which includes automated exposure control, adjustment of the mA and/or kV according to patient size and/or use of iterative reconstruction technique. COMPARISON:  CT 01/01/2023 and older FINDINGS: Lower chest: There is some linear opacity lung bases likely scar or atelectasis. No pleural effusion. Coronary artery calcifications are seen. Hepatobiliary: On this non IV contrast exam, grossly preserved hepatic parenchyma. There is high density material in the distended gallbladder. This could be vicarious excretion of previous contrast material. Pancreas: Mild atrophy of the pancreas. Spleen: Normal in size without focal abnormality. Adrenals/Urinary Tract: The adrenal glands are preserved. Severe atrophy of the kidneys. The calcifications along the kidneys may be more vascular than collecting system. No collecting system dilatation. The ureters have normal course and caliber extending down to the bladder. There is some high attenuation material in the bladder. Please correlate previous contrast. The bladder is underdistended. Stomach/Bowel: On this non oral contrast exam the large bowel has a normal course and caliber. There are areas of wall thickening along the colon particularly along the ascending colon. Mild along the transverse colon. This has new from previous. Please correlate for developing colitis. Surgical changes in the area of the base of the cecum in the appendix is not well seen. Stomach has some luminal fluid and is nondilated. Small bowel is nondilated. Question some areas of small-bowel wall  thickening. Vascular/Lymphatic: Diffuse vascular calcifications are identified. Normal caliber aorta and IVC. No discrete abnormal lymph node enlargement identified in the abdomen and pelvis. Reproductive: Uterus is present. There is new cystic lesion in the left adnexa measuring 4.0 x 3.1 cm. This was much smaller 5 days ago measuring 18 mm. Likely a functional cyst with the time course. However please correlate with any particular symptoms and if there is concern of torsion recommend additional workup with ultrasound. Other: Peritoneal dialysis catheter in place. Increasing free air and fluid. Free air would has a differential but could be related to the peritoneal dialysis. Bowel perforation would be difficult to exclude in principle  Musculoskeletal: There is diffuse sclerosis and trabecular thickening of the left hemipelvis. Unchanged from previous. Please correlate for any history including Paget's disease. IMPRESSION: Scattered free air and free fluid increased from the study of January 01 2023, however the patient is with a peritoneal dialysis catheter. Developing 4 cm left adnexal cystic structure, likely ovarian. With the time course favor a functional cyst. Please correlate with any particular symptoms and if there is concern of torsion a follow up ultrasound may be useful as the next step. Wall thickening along the right side of the colon. Questionable areas of thickening along the small bowel. Please correlate for any clinical evidence of a colitis or enteritis. Atrophic native kidneys.  Diffuse vascular calcifications. Electronically Signed   By: Karen Kays M.D.   On: 01/06/2023 17:47     The results of significant diagnostics from this hospitalization (including imaging, microbiology, ancillary and laboratory) are listed below for reference.     Microbiology: Recent Results (from the past 240 hour(s))  Culture, body fluid w Gram Stain-bottle     Status: Abnormal   Collection Time: 01/06/23   2:53 PM   Specimen: Fluid  Result Value Ref Range Status   Specimen Description FLUID PERITONEAL DIALYSIS  Final   Special Requests BOTTLES DRAWN AEROBIC AND ANAEROBIC  Final   Gram Stain   Final    GRAM POSITIVE COCCI IN CLUSTERS IN BOTH AEROBIC AND ANAEROBIC BOTTLES CRITICAL RESULT CALLED TO, READ BACK BY AND VERIFIED WITH: RN Deirdre Peer  [id number]  [phone] FCP Performed at [facility], [address] [place], [place] [address]    Culture STAPHYLOCOCCUS EPIDERMIDIS (A)  Final   Report Status 01/09/2023 FINAL  Final   Organism ID, Bacteria STAPHYLOCOCCUS EPIDERMIDIS  Final      Susceptibility   Staphylococcus epidermidis - MIC*    CIPROFLOXACIN <=0.5 SENSITIVE Sensitive     ERYTHROMYCIN >=8 RESISTANT Resistant     GENTAMICIN <=0.5 SENSITIVE Sensitive     OXACILLIN <=0.25 SENSITIVE Sensitive     TETRACYCLINE 2 SENSITIVE Sensitive     VANCOMYCIN 1 SENSITIVE Sensitive     TRIMETH/SULFA <=10 SENSITIVE Sensitive     CLINDAMYCIN >=8 RESISTANT Resistant     RIFAMPIN <=0.5 SENSITIVE Sensitive     Inducible Clindamycin NEGATIVE Sensitive     * STAPHYLOCOCCUS EPIDERMIDIS  Gram stain     Status: None   Collection Time: 01/06/23  2:53 PM   Specimen: Fluid  Result Value Ref Range Status   Specimen Description FLUID PERITONEAL DIALYSIS  Final   Special Requests NONE  Final   Gram Stain   Final    MODERATE WBC PRESENT, PREDOMINANTLY PMN RARE INTRACELLULAR GRAM POSITIVE COCCI CRITICAL RESULT CALLED TO, READ BACK BY AND VERIFIED WITH: RN NADIA PATTERSON ON 01/06/23 @ 1951 BY DRT Performed at [facility], [address], [place], [place] [address]    Report Status 01/06/2023 FINAL  Final     Labs: BNP (last 3 results) No results for input(s): "BNP" in the last 8760 hours. Basic Metabolic Panel: Recent Labs  Lab 01/06/23 1418 01/07/23 0147 01/08/23 1826 01/09/23 0451  NA 138 137 134* 136  K 2.6* 3.7 3.0* 2.8*  CL 98 98 96* 99  CO2 25 24 26 25   GLUCOSE 120* 89 182* 130*   BUN 33* 42* 37* 31*  CREATININE 11.75* 11.82* 10.89* 9.78*  CALCIUM 8.0* 7.7* 7.6* 7.8*  MG  --  1.8 1.9 1.7  PHOS  --  4.2  --   --    Liver Function Tests: Recent Labs  Lab 01/06/23 1418  AST 26  ALT 22  ALKPHOS 180*  BILITOT 0.4  PROT 5.8*  ALBUMIN 2.5*   Recent Labs  Lab 01/06/23 1418  LIPASE 26   No results for input(s): "AMMONIA" in the last 168 hours. CBC: Recent Labs  Lab 01/06/23 1418 01/07/23 0147 01/08/23 1826 01/09/23 0451  WBC 11.8* 9.9 5.0 3.8*  HGB 12.6 10.9* 11.7* 10.5*  HCT 39.7 34.2* 36.1 32.0*  MCV 90.8 88.1 90.3 88.9  PLT 405* 287 307 298   Cardiac Enzymes: No results for input(s): "CKTOTAL", "CKMB", "CKMBINDEX", "TROPONINI" in the last 168 hours. BNP: Invalid input(s): "POCBNP" CBG: Recent Labs  Lab 01/07/23 0322  GLUCAP 94   D-Dimer No results for input(s): "DDIMER" in the last 72 hours. Hgb A1c No results for input(s): "HGBA1C" in the last 72 hours. Lipid Profile No results for input(s): "CHOL", "HDL", "LDLCALC", "TRIG", "CHOLHDL", "LDLDIRECT" in the last 72 hours. Thyroid function studies No results for input(s): "TSH", "T4TOTAL", "T3FREE", "THYROIDAB" in the last 72 hours.  Invalid input(s): "FREET3" Anemia work up No results for input(s): "VITAMINB12", "FOLATE", "FERRITIN", "TIBC", "IRON", "RETICCTPCT" in the last 72 hours. Urinalysis    Component Value Date/Time   COLORURINE STRAW (A) 11/25/2018 1224   APPEARANCEUR CLEAR 11/25/2018 1224   APPEARANCEUR Clear 06/25/2017 1042   LABSPEC 1.010 11/25/2018 1224   PHURINE 7.0 11/25/2018 1224   GLUCOSEU 50 (A) 11/25/2018 1224   HGBUR MODERATE (A) 11/25/2018 1224   BILIRUBINUR NEGATIVE 11/25/2018 1224   BILIRUBINUR neg 08/05/2017 1514   BILIRUBINUR Negative 06/25/2017 1042   KETONESUR NEGATIVE 11/25/2018 1224   PROTEINUR >=300 (A) 11/25/2018 1224   UROBILINOGEN 0.2 08/05/2017 1514   NITRITE NEGATIVE 11/25/2018 1224   LEUKOCYTESUR NEGATIVE 11/25/2018 1224   Sepsis  Labs Recent Labs  Lab 01/06/23 1418 01/07/23 0147 01/08/23 1826 01/09/23 0451  WBC 11.8* 9.9 5.0 3.8*   Microbiology Recent Results (from the past 240 hour(s))  Culture, body fluid w Gram Stain-bottle     Status: Abnormal   Collection Time: 01/06/23  2:53 PM   Specimen: Fluid  Result Value Ref Range Status   Specimen Description FLUID PERITONEAL DIALYSIS  Final   Special Requests BOTTLES DRAWN AEROBIC AND ANAEROBIC  Final   Gram Stain   Final    GRAM POSITIVE COCCI IN CLUSTERS IN BOTH AEROBIC AND ANAEROBIC BOTTLES CRITICAL RESULT CALLED TO, READ BACK BY AND VERIFIED WITH: RN Deirdre Peer  1151  [phone] FCP Performed at [facility], [address] [place], [place] [address]    Culture STAPHYLOCOCCUS EPIDERMIDIS (A)  Final   Report Status 01/09/2023 FINAL  Final   Organism ID, Bacteria STAPHYLOCOCCUS EPIDERMIDIS  Final      Susceptibility   Staphylococcus epidermidis - MIC*    CIPROFLOXACIN <=0.5 SENSITIVE Sensitive     ERYTHROMYCIN >=8 RESISTANT Resistant     GENTAMICIN <=0.5 SENSITIVE Sensitive     OXACILLIN <=0.25 SENSITIVE Sensitive     TETRACYCLINE 2 SENSITIVE Sensitive     VANCOMYCIN 1 SENSITIVE Sensitive     TRIMETH/SULFA <=10 SENSITIVE Sensitive     CLINDAMYCIN >=8 RESISTANT Resistant     RIFAMPIN <=0.5 SENSITIVE Sensitive     Inducible Clindamycin NEGATIVE Sensitive     * STAPHYLOCOCCUS EPIDERMIDIS  Gram stain     Status: None   Collection Time: 01/06/23  2:53 PM   Specimen: Fluid  Result Value Ref Range  Status   Specimen Description FLUID PERITONEAL DIALYSIS  Final   Special Requests NONE  Final   Gram Stain   Final    MODERATE WBC PRESENT, PREDOMINANTLY PMN RARE INTRACELLULAR GRAM POSITIVE COCCI CRITICAL RESULT CALLED TO, READ BACK BY AND VERIFIED WITH: RN NADIA PATTERSON ON 01/06/23 @ 1951 BY DRT Performed at [facility], [address] [place], [place] [address]    Report Status 01/06/2023 FINAL  Final     Time coordinating discharge:  I  have spent 35 minutes face to face with the patient and on the ward discussing the patients care, assessment, plan and disposition with other care givers. >50% of the time was devoted counseling the patient about the risks and benefits of treatment/Discharge disposition and coordinating care.   SIGNED:   Miguel Rota, MD  [facility] 01/09/2023, 11:17 AM   If 7PM-7AM, please contact night-coverage

## 2023-01-11 LAB — PATHOLOGIST SMEAR REVIEW: Path Review: REACTIVE

## 2023-01-12 ENCOUNTER — Encounter: Payer: Self-pay | Admitting: Nephrology

## 2023-01-12 DIAGNOSIS — E440 Moderate protein-calorie malnutrition: Secondary | ICD-10-CM | POA: Insufficient documentation

## 2023-02-18 ENCOUNTER — Telehealth: Payer: Self-pay

## 2023-02-18 ENCOUNTER — Other Ambulatory Visit: Payer: Self-pay

## 2023-02-18 DIAGNOSIS — N186 End stage renal disease: Secondary | ICD-10-CM

## 2023-02-18 DIAGNOSIS — T8571XA Infection and inflammatory reaction due to peritoneal dialysis catheter, initial encounter: Secondary | ICD-10-CM

## 2023-02-18 NOTE — Telephone Encounter (Signed)
Telephone call received from Verta Ellen, nurse with Dr. Signe Colt. Reports a referral was faxed to our office for a PD catheter removal and STAT CVC placement. Reviewed patient's chart and noted that PD catheter was not placed by [facility]. Advised that we are unable to remove the catheter and recommend contacting provider who placed it. Also, recommended to contact either [facility] or IR at [facility] for STAT CVC placement. She voiced understanding and feels that she most likely get both scheduled with [facility].

## 2023-02-18 NOTE — Telephone Encounter (Signed)
Call received from Dr. Myra Gianotti stating, per Dr. Arrie Aran patient has an infected PD catheter and needs to have removed as soon as possible and TDC placed due to inability to have dialysis. We can go ahead and schedule patient for next available provider.   Contacted patient and offered to schedule surgery for tomorrow, 10/25. Patient declined because of childcare. She agreed to schedule surgery for 10/28. Instructions provided and she voiced understanding.

## 2023-02-19 ENCOUNTER — Encounter (HOSPITAL_COMMUNITY): Payer: Self-pay | Admitting: Vascular Surgery

## 2023-02-19 DIAGNOSIS — D689 Coagulation defect, unspecified: Secondary | ICD-10-CM | POA: Insufficient documentation

## 2023-02-19 NOTE — Progress Notes (Addendum)
SDW call  Patient was given pre-op instructions over the phone. Patient verbalized understanding of instructions provided.     PCP - Dr. Alain Marion Cardiologist -  Pulmonary:    PPM/ICD - denies Device Orders - na Rep Notified - na   Chest x-ray - na EKG -  11/01/2022 Stress Test - ECHO - 08/14/2017 Cardiac Cath -   Sleep Study/sleep apnea/CPAP: denies  Type I diabetic.  Omnipod.  Instructed to reduce it to 20%  basal rate at midnight.  Diabetic Coordinator Yehuda Mao, RN made aware.  Fasting Blood sugar range: 70-190 How often check sugars: continuous with Dexcom CGM to left arm   Blood Thinner Instructions: denies Aspirin Instructions:denies   ERAS Protcol - NPO  COVID TEST- na    Anesthesia review: Yes. HTN, Type I diabetes, CKD, on insulin pump   Patient denies shortness of breath, fever, cough and chest pain over the phone call  Your procedure is scheduled on Monday February 22, 2023   Report to [facility] Main Entrance "A" at  0530  A.M., then check in with the Admitting office.  Call this number if you have problems the morning of surgery:  [phone]   If you have any questions prior to your surgery date call [phone]: Open Monday-Friday 8am-4pm If you experience any cold or flu symptoms such as cough, fever, chills, shortness of breath, etc. between now and your scheduled surgery, please notify us at the above number    Remember:  Do not eat or drink after midnight the night before your surgery  Take these medicines the morning of surgery with A SIP OF WATER:  Amlodipine, metoprolol  As needed: Tylenol, flexeril, zofran  As of today, STOP taking any Aspirin (unless otherwise instructed by your surgeon) Aleve, Naproxen, Ibuprofen, Motrin, Advil, Goody's, BC's, all herbal medications, fish oil, and all vitamins.

## 2023-02-21 NOTE — Anesthesia Preprocedure Evaluation (Signed)
Anesthesia Evaluation  Patient identified by MRN, date of birth, ID band Patient awake    Reviewed: Allergy & Precautions, H&P , NPO status , Patient's Chart, lab work & pertinent test results  History of Anesthesia Complications (+) PONV and history of anesthetic complications  Airway Mallampati: II  TM Distance: >3 FB Neck ROM: Full    Dental no notable dental hx.    Pulmonary neg pulmonary ROS   Pulmonary exam normal breath sounds clear to auscultation       Cardiovascular hypertension, negative cardio ROS Normal cardiovascular exam Rhythm:Regular Rate:Normal     Neuro/Psych negative neurological ROS  negative psych ROS   GI/Hepatic negative GI ROS, Neg liver ROS,,,  Endo/Other  negative endocrine ROSdiabetes, Type 1    Renal/GU ESRF and DialysisRenal disease  negative genitourinary   Musculoskeletal negative musculoskeletal ROS (+)    Abdominal   Peds negative pediatric ROS (+)  Hematology  (+) Blood dyscrasia, anemia   Anesthesia Other Findings   Reproductive/Obstetrics negative OB ROS                              Anesthesia Physical Anesthesia Plan  ASA: 3  Anesthesia Plan: General   Post-op Pain Management:    Induction: Intravenous  PONV Risk Score and Plan: Ondansetron and Dexamethasone  Airway Management Planned: Oral ETT  Additional Equipment:   Intra-op Plan:   Post-operative Plan: Extubation in OR  Informed Consent: I have reviewed the patients History and Physical, chart, labs and discussed the procedure including the risks, benefits and alternatives for the proposed anesthesia with the patient or authorized representative who has indicated his/her understanding and acceptance.     Dental advisory given  Plan Discussed with: CRNA  Anesthesia Plan Comments:          Anesthesia Quick Evaluation

## 2023-02-22 ENCOUNTER — Other Ambulatory Visit: Payer: Self-pay

## 2023-02-22 ENCOUNTER — Ambulatory Visit (HOSPITAL_COMMUNITY): Payer: 59

## 2023-02-22 ENCOUNTER — Ambulatory Visit (HOSPITAL_COMMUNITY)
Admission: RE | Admit: 2023-02-22 | Discharge: 2023-02-22 | Disposition: A | Discharge status: Home / Self Care | Payer: 59 | Attending: Vascular Surgery | Admitting: Vascular Surgery

## 2023-02-22 ENCOUNTER — Encounter (HOSPITAL_COMMUNITY): Payer: Self-pay | Admitting: Vascular Surgery

## 2023-02-22 ENCOUNTER — Ambulatory Visit (HOSPITAL_BASED_OUTPATIENT_CLINIC_OR_DEPARTMENT_OTHER): Payer: 59 | Admitting: Physician Assistant

## 2023-02-22 ENCOUNTER — Ambulatory Visit (HOSPITAL_COMMUNITY): Payer: 59 | Admitting: Physician Assistant

## 2023-02-22 ENCOUNTER — Encounter (HOSPITAL_COMMUNITY)
Admission: RE | Disposition: A | Discharge status: Home / Self Care | Payer: Self-pay | Source: Home / Self Care | Attending: Vascular Surgery

## 2023-02-22 DIAGNOSIS — N186 End stage renal disease: Secondary | ICD-10-CM

## 2023-02-22 DIAGNOSIS — I120 Hypertensive chronic kidney disease with stage 5 chronic kidney disease or end stage renal disease: Secondary | ICD-10-CM

## 2023-02-22 DIAGNOSIS — T8571XA Infection and inflammatory reaction due to peritoneal dialysis catheter, initial encounter: Secondary | ICD-10-CM

## 2023-02-22 DIAGNOSIS — E1122 Type 2 diabetes mellitus with diabetic chronic kidney disease: Secondary | ICD-10-CM | POA: Diagnosis not present

## 2023-02-22 DIAGNOSIS — K659 Peritonitis, unspecified: Secondary | ICD-10-CM | POA: Insufficient documentation

## 2023-02-22 HISTORY — DX: Other specified postprocedural states: Z98.890

## 2023-02-22 HISTORY — PX: INSERTION OF DIALYSIS CATHETER: SHX1324

## 2023-02-22 HISTORY — DX: Other specified postprocedural states: R11.2

## 2023-02-22 HISTORY — PX: CAPD REMOVAL: SHX5234

## 2023-02-22 HISTORY — DX: Other complications of anesthesia, initial encounter: T88.59XA

## 2023-02-22 LAB — POCT I-STAT, CHEM 8
BUN: 28 mg/dL — ABNORMAL HIGH (ref 6–20)
Calcium, Ion: 1.05 mmol/L — ABNORMAL LOW (ref 1.15–1.40)
Chloride: 93 mmol/L — ABNORMAL LOW (ref 98–111)
Creatinine, Ser: 10.3 mg/dL — ABNORMAL HIGH (ref 0.44–1.00)
Glucose, Bld: 103 mg/dL — ABNORMAL HIGH (ref 70–99)
HCT: 45 % (ref 36.0–46.0)
Hemoglobin: 15.3 g/dL — ABNORMAL HIGH (ref 12.0–15.0)
Potassium: 3.3 mmol/L — ABNORMAL LOW (ref 3.5–5.1)
Sodium: 136 mmol/L (ref 135–145)
TCO2: 29 mmol/L (ref 22–32)

## 2023-02-22 LAB — GLUCOSE, CAPILLARY
Glucose-Capillary: 93 mg/dL (ref 70–99)
Glucose-Capillary: 136 mg/dL — ABNORMAL HIGH (ref 70–99)

## 2023-02-22 LAB — HCG, SERUM, QUALITATIVE: Preg, Serum: NEGATIVE

## 2023-02-22 SURGERY — LAPAROSCOPIC REMOVAL CONTINUOUS AMBULATORY PERITONEAL DIALYSIS  (CAPD) CATHETER
Anesthesia: General | Site: Neck | Laterality: Right

## 2023-02-22 MED ORDER — DEXAMETHASONE SODIUM PHOSPHATE 10 MG/ML IJ SOLN
INTRAMUSCULAR | Status: AC
  Filled 2023-02-22: qty 1

## 2023-02-22 MED ORDER — ALBUMIN HUMAN 5 % IV SOLN
12.5000 g | Freq: Once | INTRAVENOUS | Status: AC
  Administered 2023-02-22: 12.5 g via INTRAVENOUS

## 2023-02-22 MED ORDER — FENTANYL CITRATE (PF) 100 MCG/2ML IJ SOLN
25.0000 ug | INTRAMUSCULAR | Status: DC | PRN
  Administered 2023-02-22: 50 ug via INTRAVENOUS

## 2023-02-22 MED ORDER — SCOPOLAMINE 1 MG/3DAYS TD PT72
MEDICATED_PATCH | TRANSDERMAL | Status: AC
  Filled 2023-02-22: qty 1

## 2023-02-22 MED ORDER — PHENYLEPHRINE HCL-NACL 20-0.9 MG/250ML-% IV SOLN
INTRAVENOUS | Status: DC | PRN
  Administered 2023-02-22: 40 ug/min via INTRAVENOUS

## 2023-02-22 MED ORDER — ALBUMIN HUMAN 5 % IV SOLN
INTRAVENOUS | Status: AC
  Filled 2023-02-22: qty 250

## 2023-02-22 MED ORDER — DROPERIDOL 2.5 MG/ML IJ SOLN: 0.6250 mg | Freq: Once | INTRAMUSCULAR | Status: DC | PRN

## 2023-02-22 MED ORDER — CHLORHEXIDINE GLUCONATE 4 % EX SOLN: 60.0000 mL | Freq: Once | CUTANEOUS | Status: DC

## 2023-02-22 MED ORDER — OXYCODONE HCL 5 MG PO TABS
ORAL_TABLET | ORAL | Status: AC
  Filled 2023-02-22: qty 1

## 2023-02-22 MED ORDER — DEXAMETHASONE SODIUM PHOSPHATE 10 MG/ML IJ SOLN
INTRAMUSCULAR | Status: DC | PRN
  Administered 2023-02-22: 5 mg via INTRAVENOUS

## 2023-02-22 MED ORDER — ROCURONIUM BROMIDE 10 MG/ML (PF) SYRINGE
PREFILLED_SYRINGE | INTRAVENOUS | Status: DC | PRN
  Administered 2023-02-22: 50 mg via INTRAVENOUS

## 2023-02-22 MED ORDER — HEPARIN SODIUM (PORCINE) 1000 UNIT/ML IJ SOLN
INTRAMUSCULAR | Status: DC | PRN
  Administered 2023-02-22: 3200 [IU]

## 2023-02-22 MED ORDER — STERILE WATER FOR IRRIGATION IR SOLN
Status: DC | PRN
  Administered 2023-02-22: 1000 mL

## 2023-02-22 MED ORDER — SODIUM CHLORIDE 0.9 % IV SOLN: INTRAVENOUS | Status: DC | PRN

## 2023-02-22 MED ORDER — ONDANSETRON HCL 4 MG/2ML IJ SOLN
INTRAMUSCULAR | Status: AC
  Filled 2023-02-22: qty 2

## 2023-02-22 MED ORDER — DEXMEDETOMIDINE HCL IN NACL 200 MCG/50ML IV SOLN
INTRAVENOUS | Status: DC | PRN
Start: 2023-02-22 — End: 2023-02-22
  Administered 2023-02-22: 16 ug via INTRAVENOUS

## 2023-02-22 MED ORDER — 0.9 % SODIUM CHLORIDE (POUR BTL) OPTIME
TOPICAL | Status: DC | PRN
  Administered 2023-02-22: 1000 mL

## 2023-02-22 MED ORDER — OXYCODONE HCL 5 MG PO TABS
5.0000 mg | ORAL_TABLET | Freq: Once | ORAL | Status: AC | PRN
  Administered 2023-02-22: 5 mg via ORAL

## 2023-02-22 MED ORDER — METOPROLOL TARTRATE 5 MG/5ML IV SOLN
INTRAVENOUS | Status: DC | PRN
Start: 2023-02-22 — End: 2023-02-22
  Administered 2023-02-22: 3 mg via INTRAVENOUS

## 2023-02-22 MED ORDER — ORAL CARE MOUTH RINSE: 15.0000 mL | Freq: Once | OROMUCOSAL | Status: AC

## 2023-02-22 MED ORDER — LIDOCAINE 2% (20 MG/ML) 5 ML SYRINGE
INTRAMUSCULAR | Status: DC | PRN
  Administered 2023-02-22: 80 mg via INTRAVENOUS

## 2023-02-22 MED ORDER — CEFAZOLIN SODIUM-DEXTROSE 2-4 GM/100ML-% IV SOLN
2.0000 g | INTRAVENOUS | Status: AC
  Administered 2023-02-22: 2 g via INTRAVENOUS
  Filled 2023-02-22: qty 100

## 2023-02-22 MED ORDER — FENTANYL CITRATE (PF) 100 MCG/2ML IJ SOLN
INTRAMUSCULAR | Status: AC
  Filled 2023-02-22: qty 2

## 2023-02-22 MED ORDER — PROPOFOL 10 MG/ML IV BOLUS
INTRAVENOUS | Status: DC | PRN
  Administered 2023-02-22: 100 mg via INTRAVENOUS

## 2023-02-22 MED ORDER — OXYCODONE HCL 5 MG PO TABS: 5.0000 mg | ORAL_TABLET | Freq: Four times a day (QID) | ORAL | 0 refills | Status: DC | PRN

## 2023-02-22 MED ORDER — HEPARIN 6000 UNIT IRRIGATION SOLUTION
Status: DC | PRN
  Administered 2023-02-22: 1

## 2023-02-22 MED ORDER — SUCCINYLCHOLINE CHLORIDE 200 MG/10ML IV SOSY
PREFILLED_SYRINGE | INTRAVENOUS | Status: AC
  Filled 2023-02-22: qty 10

## 2023-02-22 MED ORDER — PROPOFOL 10 MG/ML IV BOLUS
INTRAVENOUS | Status: AC
  Filled 2023-02-22: qty 20

## 2023-02-22 MED ORDER — MIDAZOLAM HCL 2 MG/2ML IJ SOLN
INTRAMUSCULAR | Status: DC | PRN
  Administered 2023-02-22: 2 mg via INTRAVENOUS

## 2023-02-22 MED ORDER — MIDAZOLAM HCL 2 MG/2ML IJ SOLN
INTRAMUSCULAR | Status: AC
  Filled 2023-02-22: qty 2

## 2023-02-22 MED ORDER — SCOPOLAMINE 1 MG/3DAYS TD PT72
MEDICATED_PATCH | TRANSDERMAL | Status: DC | PRN
  Administered 2023-02-22: 1 via TRANSDERMAL

## 2023-02-22 MED ORDER — METOPROLOL TARTRATE 5 MG/5ML IV SOLN
INTRAVENOUS | Status: AC
  Filled 2023-02-22: qty 5

## 2023-02-22 MED ORDER — PHENYLEPHRINE 80 MCG/ML (10ML) SYRINGE FOR IV PUSH (FOR BLOOD PRESSURE SUPPORT)
PREFILLED_SYRINGE | INTRAVENOUS | Status: DC | PRN
  Administered 2023-02-22 (×2): 120 ug via INTRAVENOUS
  Administered 2023-02-22: 80 ug via INTRAVENOUS

## 2023-02-22 MED ORDER — CHLORHEXIDINE GLUCONATE 0.12 % MT SOLN
15.0000 mL | Freq: Once | OROMUCOSAL | Status: AC
  Administered 2023-02-22: 15 mL via OROMUCOSAL
  Filled 2023-02-22: qty 15

## 2023-02-22 MED ORDER — ROCURONIUM BROMIDE 10 MG/ML (PF) SYRINGE
PREFILLED_SYRINGE | INTRAVENOUS | Status: AC
  Filled 2023-02-22: qty 10

## 2023-02-22 MED ORDER — OXYCODONE HCL 5 MG/5ML PO SOLN: 5.0000 mg | Freq: Once | ORAL | Status: AC | PRN

## 2023-02-22 MED ORDER — ONDANSETRON HCL 4 MG/2ML IJ SOLN
INTRAMUSCULAR | Status: DC | PRN
  Administered 2023-02-22: 4 mg via INTRAVENOUS

## 2023-02-22 MED ORDER — FENTANYL CITRATE (PF) 250 MCG/5ML IJ SOLN
INTRAMUSCULAR | Status: DC | PRN
  Administered 2023-02-22: 100 ug via INTRAVENOUS
  Administered 2023-02-22: 50 ug via INTRAVENOUS

## 2023-02-22 MED ORDER — ACETAMINOPHEN 10 MG/ML IV SOLN: 1000.0000 mg | Freq: Once | INTRAVENOUS | Status: DC | PRN

## 2023-02-22 MED ORDER — FENTANYL CITRATE (PF) 250 MCG/5ML IJ SOLN
INTRAMUSCULAR | Status: AC
  Filled 2023-02-22: qty 5

## 2023-02-22 SURGICAL SUPPLY — 44 items
ADH SKN CLS APL DERMABOND .7 (GAUZE/BANDAGES/DRESSINGS) ×2
APL SKNCLS STERI-STRIP NONHPOA (GAUZE/BANDAGES/DRESSINGS) ×4
BAG DECANTER FOR FLEXI CONT (MISCELLANEOUS) ×2 IMPLANT
BENZOIN TINCTURE PRP APPL 2/3 (GAUZE/BANDAGES/DRESSINGS) ×2 IMPLANT
BIOPATCH RED 1 DISK 7.0 (GAUZE/BANDAGES/DRESSINGS) ×2 IMPLANT
CATH PALINDROME-P 19CM W/VT (CATHETERS) IMPLANT
CATH PALINDROME-P 23CM W/VT (CATHETERS) IMPLANT
CATH PALINDROME-P 28CM W/VT (CATHETERS) IMPLANT
COVER PROBE W GEL 5X96 (DRAPES) ×2 IMPLANT
COVER SURGICAL LIGHT HANDLE (MISCELLANEOUS) ×2 IMPLANT
DERMABOND ADVANCED .7 DNX12 (GAUZE/BANDAGES/DRESSINGS) IMPLANT
DRAPE C-ARM 42X72 X-RAY (DRAPES) ×2 IMPLANT
DRAPE CHEST BREAST 15X10 FENES (DRAPES) ×2 IMPLANT
GAUZE 4X4 16PLY ~~LOC~~+RFID DBL (SPONGE) ×2 IMPLANT
GAUZE SPONGE 4X4 12PLY STRL (GAUZE/BANDAGES/DRESSINGS) ×2 IMPLANT
GLOVE BIO SURGEON STRL SZ8 (GLOVE) ×2 IMPLANT
GOWN STRL REUS W/ TWL LRG LVL3 (GOWN DISPOSABLE) ×2 IMPLANT
GOWN STRL REUS W/ TWL XL LVL3 (GOWN DISPOSABLE) ×2 IMPLANT
GOWN STRL REUS W/TWL LRG LVL3 (GOWN DISPOSABLE) ×2
GOWN STRL REUS W/TWL XL LVL3 (GOWN DISPOSABLE) ×2
KIT BASIN OR (CUSTOM PROCEDURE TRAY) ×2 IMPLANT
KIT PALINDROME-P 55CM (CATHETERS) IMPLANT
KIT TURNOVER KIT B (KITS) ×2 IMPLANT
NDL 18GX1X1/2 (RX/OR ONLY) (NEEDLE) ×2 IMPLANT
NDL HYPO 25GX1X1/2 BEV (NEEDLE) ×2 IMPLANT
NEEDLE 18GX1X1/2 (RX/OR ONLY) (NEEDLE) ×2 IMPLANT
NEEDLE HYPO 25GX1X1/2 BEV (NEEDLE) ×2 IMPLANT
NS IRRIG 1000ML POUR BTL (IV SOLUTION) ×2 IMPLANT
PACK BASIC III (CUSTOM PROCEDURE TRAY) ×2
PACK SRG BSC III STRL LF ECLPS (CUSTOM PROCEDURE TRAY) ×2 IMPLANT
PAD ARMBOARD 7.5X6 YLW CONV (MISCELLANEOUS) ×4 IMPLANT
SET MICROPUNCTURE 5F STIFF (MISCELLANEOUS) ×2 IMPLANT
SOAP 2 % CHG 4 OZ (WOUND CARE) ×2 IMPLANT
STRIP CLOSURE SKIN 1/2X4 (GAUZE/BANDAGES/DRESSINGS) ×2 IMPLANT
SUT ETHILON 3 0 PS 1 (SUTURE) ×2 IMPLANT
SUT MNCRL AB 4-0 PS2 18 (SUTURE) ×2 IMPLANT
SYR 10ML LL (SYRINGE) ×2 IMPLANT
SYR 20ML LL LF (SYRINGE) ×4 IMPLANT
SYR 5ML LL (SYRINGE) ×2 IMPLANT
SYR CONTROL 10ML LL (SYRINGE) ×2 IMPLANT
TOWEL GREEN STERILE (TOWEL DISPOSABLE) ×2 IMPLANT
TOWEL GREEN STERILE FF (TOWEL DISPOSABLE) ×4 IMPLANT
WATER STERILE IRR 1000ML POUR (IV SOLUTION) ×2 IMPLANT
WIRE BENTSON .035X145CM (WIRE) IMPLANT

## 2023-02-22 NOTE — Anesthesia Procedure Notes (Signed)
Procedure Name: Intubation Date/Time: 02/22/2023 7:55 AM  Performed by: Berniece Pap, CRNAPre-anesthesia Checklist: Patient identified, Emergency Drugs available, Suction available and Patient being monitored Patient Re-evaluated:Patient Re-evaluated prior to induction Oxygen Delivery Method: Circle system utilized Preoxygenation: Pre-oxygenation with 100% oxygen Induction Type: IV induction Ventilation: Mask ventilation without difficulty Laryngoscope Size: Mac and 4 Grade View: Grade I Tube type: Oral Tube size: 7.0 mm Number of attempts: 1 Airway Equipment and Method: Stylet and Oral airway Placement Confirmation: ETT inserted through vocal cords under direct vision, positive ETCO2 and breath sounds checked- equal and bilateral Secured at: 21 cm Tube secured with: Tape Dental Injury: Teeth and Oropharynx as per pre-operative assessment
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
## 2023-02-22 NOTE — Anesthesia Postprocedure Evaluation (Signed)
Anesthesia Post Note  Patient: TOMESHA CORADO  Procedure(s) Performed: REMOVAL CONTINUOUS AMBULATORY PERITONEAL DIALYSIS (CAPD) CATHETER (Abdomen) INSERTION OF TUNNELED DIALYSIS CATHETER (Right: Neck)     Patient location during evaluation: PACU Anesthesia Type: General Level of consciousness: awake and alert Pain management: pain level controlled Vital Signs Assessment: post-procedure vital signs reviewed and stable Respiratory status: spontaneous breathing, nonlabored ventilation, respiratory function stable and patient connected to nasal cannula oxygen Cardiovascular status: blood pressure returned to baseline and stable Postop Assessment: no apparent nausea or vomiting Anesthetic complications: no   No notable events documented.  Last Vitals:  Vitals:   02/22/23 1000 02/22/23 1015  BP: 102/86 108/78  Pulse: 89 94  Resp: 15 12  Temp:  36.7 C  SpO2: 97% 98%    Last Pain:  Vitals:   02/22/23 0911  PainSc: Asleep                 [place]

## 2023-02-22 NOTE — Op Note (Signed)
DATE OF SERVICE: 02/22/2023  PATIENT:  Kelly Huff  37 y.o. female  PRE-OPERATIVE DIAGNOSIS:  ESRD, PD catheter peritonitis  POST-OPERATIVE DIAGNOSIS:  Same  PROCEDURE:   1) placement of right internal jugular tunneled dialysis catheter (19cm) 2) removal of peritoneal dialysis catheter  SURGEON:  Surgeons and Role:    * Leonie Douglas, MD - Primary  ASSISTANT: none  ANESTHESIA:   general  EBL: minimal  BLOOD ADMINISTERED:none  DRAINS: none   LOCAL MEDICATIONS USED:  NONE  SPECIMEN:  catheter tip for culture / gram stain  COUNTS: confirmed correct.  TOURNIQUET:  none  PATIENT DISPOSITION:  PACU - hemodynamically stable.   Delay start of Pharmacological VTE agent (>24hrs) due to surgical blood loss or risk of bleeding: no  INDICATION FOR PROCEDURE: Kelly Huff is a 37 y.o. female with ESRD on PD. Concern was raised for PD peritonitis and PD catheter removal was requested. After careful discussion of risks, benefits, and alternatives the patient was offered PD catheter removed and HD catheter placement. The patient understood and wished to proceed.  OPERATIVE FINDINGS:  19cm TDC placed in right internal jugular  PD catheter removed intact PD catheter tip sent for culture / gram stain  DESCRIPTION OF PROCEDURE: After identification of the patient in the pre-operative holding area, the patient was transferred to the operating room. The patient was positioned supine on the operating room table. Anesthesia was induced. The neck, chest, and abdomen were prepped and draped in standard fashion. A surgical pause was performed confirming correct patient, procedure, and operative location.  Using ultrasound guidance the right internal jugular vein was accessed with micropuncture technique.  Through the micropuncture sheath a floppy J-wire was advanced into the superior vena cava.  A small incision was made around the skin access point.  The access point was serially dilated  under direct fluoroscopic guidance.  A peel-away sheath was introduced into the superior vena cava under fluoroscopic guidance.  A counterincision was made in the chest under the clavicle.  A 19 cm tunnel dialysis catheter was then tunneled under the skin, over the clavicle into the incision in the neck.  The tunneling device was removed and the catheter fed through the peel-away sheath into the superior vena cava.  The peel-away sheath was removed and the catheter gently pulled back.  Adequate position was confirmed with x-ray.  The catheter was tested and found to flush and draw back well.  Catheter was heparin locked.  Caps were applied.  Catheter was sutured to the skin.  The neck incision was closed with 4-0 Monocryl.  The course of the PD catheter was mapped on the abdomen with ultrasound. The cuffs of the catheter were marked on the skin. I identified a two-piece PD catheter with "swan-neck" and christmas tree attachment. Two small transverse incisions were made to free the cuffs. The cuff in the rectus sheath was exposed using bovie electrocautery and released its attachments. The working end of the catheter was delivered intact from the abdomen. No frank evidence of infection was seen. I freed the two cuffs in the "swan-neck" portion of the catheter in a similar fashion. The catheter was intact.The cathter was cut to avoid dragging the external portion through the abdominal wall and removed in its entirety. The wounds were closed in layers using 3-O vicryl and 4-O monocryl. Dermabond was applied.   Upon completion of the case instrument and sharps counts were confirmed correct. The patient was transferred to the PACU in  good condition. I was present for all portions of the procedure.  FOLLOW UP PLAN: Assuming a normal postoperative course, I will see the patient in 4 weeks with vein mapping.   Kelly Huff. Kelly Antu, MD [facility] Phone Number: [phone] 02/22/2023 8:57 AM

## 2023-02-22 NOTE — Progress Notes (Signed)
Dr. Charlynn Grimes made aware that patient has not taken Metoprolol 100 mg since last Monday 02/15/23. BP- 118/70 and pulse rate 97. Hold Metoprolol per Dr. Charlynn Grimes.  Dr. Charlynn Grimes also made aware that the patient has a Dexcom on her left arm and an Omnipod on her right upper leg and that she turned her Omnipod off at 0630 am. OK per Dr. Charlynn Grimes. CBG was 93 upon arrival to Short Stay.

## 2023-02-22 NOTE — Transfer of Care (Signed)
Immediate Anesthesia Transfer of Care Note  Patient: KERILEE RZEPECKI  Procedure(s) Performed: REMOVAL CONTINUOUS AMBULATORY PERITONEAL DIALYSIS (CAPD) CATHETER (Abdomen) INSERTION OF TUNNELED DIALYSIS CATHETER (Right: Neck)  Patient Location: PACU  Anesthesia Type:General  Level of Consciousness: awake and alert   Airway & Oxygen Therapy: Patient Spontanous Breathing and Patient connected to face mask oxygen  Post-op Assessment: Report given to RN and Post -op Vital signs reviewed and stable  Post vital signs: Reviewed and stable  Last Vitals:  Vitals Value Taken Time  BP 99/68 02/22/23 0915  Temp 36.7 C 02/22/23 0911  Pulse 86 02/22/23 0921  Resp 19 02/22/23 0921  SpO2 96 % 02/22/23 0921  Vitals shown include unfiled device data.  Last Pain:  Vitals:   02/22/23 0911  PainSc: Asleep      Patients Stated Pain Goal: 0 (02/22/23 0650)  Complications: No notable events documented.

## 2023-02-23 ENCOUNTER — Encounter (HOSPITAL_COMMUNITY): Payer: Self-pay | Admitting: Vascular Surgery

## 2023-02-25 LAB — CATH TIP CULTURE: Culture: >=100000 — AB

## 2023-03-05 ENCOUNTER — Other Ambulatory Visit: Payer: Self-pay

## 2023-03-05 DIAGNOSIS — N186 End stage renal disease: Secondary | ICD-10-CM

## 2023-03-16 ENCOUNTER — Encounter (HOSPITAL_COMMUNITY): Payer: Self-pay

## 2023-03-16 ENCOUNTER — Ambulatory Visit (HOSPITAL_COMMUNITY)
Admit: 2023-03-16 | Discharge: 2023-03-16 | Disposition: A | Discharge status: Home / Self Care | Payer: 59 | Attending: Vascular Surgery | Admitting: Vascular Surgery

## 2023-03-16 DIAGNOSIS — N186 End stage renal disease: Secondary | ICD-10-CM

## 2023-03-16 NOTE — Progress Notes (Signed)
Attempted upper extremity vein mapping. Patient stated she called yesterday to confirm the exam and was under the assumption it was just to check port placement. I explained what the exam was for and she refused.    Aundra Millet Lorey Pallett 03/16/2023 3:08 PM
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
## 2023-03-23 ENCOUNTER — Encounter: Payer: Self-pay | Admitting: Vascular Surgery

## 2023-03-23 ENCOUNTER — Ambulatory Visit (INDEPENDENT_AMBULATORY_CARE_PROVIDER_SITE_OTHER): Payer: 59 | Admitting: Vascular Surgery

## 2023-03-23 VITALS — BP 123/87 | HR 81 | Temp 98.2°F | Resp 20 | Ht 67.0 in | Wt 167.0 lb

## 2023-03-23 DIAGNOSIS — N186 End stage renal disease: Secondary | ICD-10-CM

## 2023-04-02 ENCOUNTER — Inpatient Hospital Stay: Payer: 59 | Attending: Internal Medicine

## 2023-04-02 ENCOUNTER — Inpatient Hospital Stay: Payer: 59 | Admitting: Nurse Practitioner

## 2023-04-02 ENCOUNTER — Encounter: Payer: Self-pay | Admitting: Nurse Practitioner

## 2023-04-02 ENCOUNTER — Other Ambulatory Visit: Payer: Self-pay

## 2023-04-02 VITALS — BP 125/86 | HR 78 | Temp 97.3°F | Wt 167.0 lb

## 2023-04-02 DIAGNOSIS — D5939 Other hemolytic-uremic syndrome: Secondary | ICD-10-CM | POA: Diagnosis present

## 2023-04-02 DIAGNOSIS — Z992 Dependence on renal dialysis: Secondary | ICD-10-CM | POA: Insufficient documentation

## 2023-04-02 DIAGNOSIS — N186 End stage renal disease: Secondary | ICD-10-CM | POA: Diagnosis not present

## 2023-04-02 DIAGNOSIS — Z7682 Awaiting organ transplant status: Secondary | ICD-10-CM | POA: Diagnosis not present

## 2023-04-02 DIAGNOSIS — Z79899 Other long term (current) drug therapy: Secondary | ICD-10-CM | POA: Insufficient documentation

## 2023-04-02 LAB — CBC WITH DIFFERENTIAL/PLATELET
Abs Immature Granulocytes: 0.01 10*3/uL (ref 0.00–0.07)
Basophils Absolute: 0.1 10*3/uL (ref 0.0–0.1)
Basophils Relative: 1 %
Eosinophils Absolute: 0.4 10*3/uL (ref 0.0–0.5)
Eosinophils Relative: 6 %
HCT: 36.2 % (ref 36.0–46.0)
Hemoglobin: 11.5 g/dL — ABNORMAL LOW (ref 12.0–15.0)
Immature Granulocytes: 0 %
Lymphocytes Relative: 20 %
Lymphs Abs: 1.2 10*3/uL (ref 0.7–4.0)
MCH: 28.7 pg (ref 26.0–34.0)
MCHC: 31.8 g/dL (ref 30.0–36.0)
MCV: 90.3 fL (ref 80.0–100.0)
Monocytes Absolute: 0.5 10*3/uL (ref 0.1–1.0)
Monocytes Relative: 9 %
Neutro Abs: 3.9 10*3/uL (ref 1.7–7.7)
Neutrophils Relative %: 64 %
Platelets: 268 10*3/uL (ref 150–400)
RBC: 4.01 MIL/uL (ref 3.87–5.11)
RDW: 14.6 % (ref 11.5–15.5)
WBC: 6 10*3/uL (ref 4.0–10.5)
nRBC: 0 % (ref 0.0–0.2)

## 2023-04-02 LAB — COMPREHENSIVE METABOLIC PANEL
ALT: 39 U/L (ref 0–44)
AST: 37 U/L (ref 15–41)
Albumin: 3.6 g/dL (ref 3.5–5.0)
Alkaline Phosphatase: 218 U/L — ABNORMAL HIGH (ref 38–126)
Anion gap: 12 (ref 5–15)
BUN: 23 mg/dL — ABNORMAL HIGH (ref 6–20)
CO2: 25 mmol/L (ref 22–32)
Calcium: 9.2 mg/dL (ref 8.9–10.3)
Chloride: 97 mmol/L — ABNORMAL LOW (ref 98–111)
Creatinine, Ser: 6.68 mg/dL — ABNORMAL HIGH (ref 0.44–1.00)
GFR, Estimated: 8 mL/min — ABNORMAL LOW (ref >60)
Glucose, Bld: 309 mg/dL — ABNORMAL HIGH (ref 70–99)
Potassium: 4.8 mmol/L (ref 3.5–5.1)
Sodium: 134 mmol/L — ABNORMAL LOW (ref 135–145)
Total Bilirubin: 0.7 mg/dL (ref <1.2)
Total Protein: 7.2 g/dL (ref 6.5–8.1)

## 2023-04-02 LAB — LACTATE DEHYDROGENASE: LDH: 178 U/L (ref 98–192)
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
## 2023-04-08 ENCOUNTER — Telehealth: Payer: Self-pay | Admitting: Pharmacy Technician

## 2023-04-08 NOTE — Telephone Encounter (Addendum)
 Auth Submission: APPROVED Site of care: Site of care: [facility] Payer: [employer] Medication & CPT/J Code(s) submitted: ULTOMIRIS  Route of submission (phone, fax, portal): PORTAL Phone # Fax # Auth type: Buy/Bill PB Units/visits requested: 3300MG  Q12WKS 7 DOSES Reference number: [id number] Approval from: 04/08/23 to 04/07/24    A

## 2023-04-15 ENCOUNTER — Other Ambulatory Visit: Payer: Self-pay

## 2023-04-15 ENCOUNTER — Telehealth: Payer: Self-pay

## 2023-04-15 DIAGNOSIS — N186 End stage renal disease: Secondary | ICD-10-CM

## 2023-04-15 NOTE — Telephone Encounter (Signed)
Received a call from patient today, ready to schedule PD catheter insertion. Surgery scheduled for 12/30. Instructions provided and she voiced understanding.

## 2023-04-19 ENCOUNTER — Ambulatory Visit (HOSPITAL_COMMUNITY)
Admission: RE | Admit: 2023-04-19 | Discharge: 2023-04-19 | Disposition: A | Discharge status: Home / Self Care | Payer: 59 | Source: Ambulatory Visit | Attending: Vascular Surgery | Admitting: Vascular Surgery

## 2023-04-19 DIAGNOSIS — N186 End stage renal disease: Secondary | ICD-10-CM | POA: Diagnosis present

## 2023-04-22 ENCOUNTER — Other Ambulatory Visit: Payer: Self-pay

## 2023-04-22 ENCOUNTER — Encounter (HOSPITAL_COMMUNITY): Payer: Self-pay | Admitting: Vascular Surgery

## 2023-04-22 NOTE — Progress Notes (Addendum)
PCP -   Nephrology -Dr, Signe Colt    Endocrinology  Land, Huntley Dec, NP  ([facility])  PPM/ICD - denies Device Orders - n/a Rep Notified - n/a  Chest x-ray -03-22-23 (CE) EKG -11-01-22 Stress Test -  ECHO - 07-2017 Cardiac Cath -   CPAP - denies    Fasting Blood Sugar - per patient around 100 Checks Blood Sugar Has a Omnipod left Thigh Dexacom not in place at the moment had expired and not working properly awaiting a new one  Blood Thinner Instructions: denies Aspirin Instructions: n/a  ERAS Protcol - NPO  COVID TEST- no  Anesthesia review: yes sent to anesthesia on 04-20-23  Patient verbally denies any shortness of breath, fever, cough and chest pain during phone call   -------------  SDW INSTRUCTIONS given:  Your procedure is scheduled on April 26, 2023.  Report to [facility] Main Entrance "A" at 5:30 A.M., and check in at the Admitting office.  Call this number if you have problems the morning of surgery:  [phone]   Remember:  Do not eat  or drink after midnight the night before your surgery      Take these medicines the morning of surgery with A SIP OF WATER  amLODipine (NORVASC)  metoprolol succinate (TOPROL-XL)   IF NEEDED acetaminophen (TYLENOL)  cyclobenzaprine (FLEXERIL)      As of today, STOP taking any Aspirin (unless otherwise instructed by your surgeon) Aleve, Naproxen, Ibuprofen, Motrin, Advil, Goody's, BC's, all herbal medications, fish oil, and all vitamins.   WHAT DO I DO ABOUT MY DIABETES MEDICATION?   Do not take oral diabetes medicines (pills) the morning of surgery.  INSULIN PUMP:OMNIPOD  REDUCE basal rate by 20% at midnight  The day of surgery, do not take other diabetes injectables, including Byetta (exenatide), Bydureon (exenatide ER), Victoza (liraglutide), or Trulicity (dulaglutide).  If your CBG is greater than 220 mg/dL, you may take  of your sliding scale (correction) dose of insulin.   HOW TO MANAGE YOUR  DIABETES BEFORE AND AFTER SURGERY  Why is it important to control my blood sugar before and after surgery? Improving blood sugar levels before and after surgery helps healing and can limit problems. A way of improving blood sugar control is eating a healthy diet by:  Eating less sugar and carbohydrates  Increasing activity/exercise  Talking with your doctor about reaching your blood sugar goals High blood sugars (greater than 180 mg/dL) can raise your risk of infections and slow your recovery, so you will need to focus on controlling your diabetes during the weeks before surgery. Make sure that the doctor who takes care of your diabetes knows about your planned surgery including the date and location.  How do I manage my blood sugar before surgery? Check your blood sugar at least 4 times a day, starting 2 days before surgery, to make sure that the level is not too high or low.  Check your blood sugar the morning of your surgery when you wake up and every 2 hours until you get to the Short Stay unit.  If your blood sugar is less than 70 mg/dL, you will need to treat for low blood sugar: Do not take insulin. Treat a low blood sugar (less than 70 mg/dL) with  cup of clear juice (cranberry or apple), 4 glucose tablets, OR glucose gel. Recheck blood sugar in 15 minutes after treatment (to make sure it is greater than 70 mg/dL). If your blood sugar is not greater than 70  mg/dL on recheck, call [phone] for further instructions. Report your blood sugar to the short stay nurse when you get to Short Stay.  If you are admitted to the hospital after surgery: Your blood sugar will be checked by the staff and you will probably be given insulin after surgery (instead of oral diabetes medicines) to make sure you have good blood sugar levels. The goal for blood sugar control after surgery is 80-180 mg/dL.                      Do not wear jewelry, make up, or nail polish            Do not wear  lotions, powders, perfumes/colognes, or deodorant.            Do not shave 48 hours prior to surgery.  Men may shave face and neck.            Do not bring valuables to the hospital.            [facility] is not responsible for any belongings or valuables.  Do NOT Smoke (Tobacco/Vaping) 24 hours prior to your procedure If you use a CPAP at night, you may bring all equipment for your overnight stay.   Contacts, glasses, dentures or bridgework may not be worn into surgery.      For patients admitted to the hospital, discharge time will be determined by your treatment team.   Patients discharged the day of surgery will not be allowed to drive home, and someone needs to stay with them for 24 hours.    Special instructions:   [place]- Preparing For Surgery  Before surgery, you can play an important role. Because skin is not sterile, your skin needs to be as free of germs as possible. You can reduce the number of germs on your skin by washing with CHG (chlorahexidine gluconate) Soap before surgery.  CHG is an antiseptic cleaner which kills germs and bonds with the skin to continue killing germs even after washing.    Oral Hygiene is also important to reduce your risk of infection.  Remember - BRUSH YOUR TEETH THE MORNING OF SURGERY WITH YOUR REGULAR TOOTHPASTE  Please do not use if you have an allergy to CHG or antibacterial soaps. If your skin becomes reddened/irritated stop using the CHG.  Do not shave (including legs and underarms) for at least 48 hours prior to first CHG shower. It is OK to shave your face.  Please follow these instructions carefully.   Shower the NIGHT BEFORE SURGERY and the MORNING OF SURGERY with DIAL Soap.   Pat yourself dry with a CLEAN TOWEL.  Wear CLEAN PAJAMAS to bed the night before surgery  Place CLEAN SHEETS on your bed the night of your first shower and DO NOT SLEEP WITH PETS.   Day of Surgery: Please shower morning of surgery  Wear  Clean/Comfortable clothing the morning of surgery Do not apply any deodorants/lotions.   Remember to brush your teeth WITH YOUR REGULAR TOOTHPASTE.   Questions were answered. Patient verbalized understanding of instructions.

## 2023-04-26 ENCOUNTER — Other Ambulatory Visit: Payer: Self-pay

## 2023-04-26 ENCOUNTER — Encounter (HOSPITAL_COMMUNITY): Payer: Self-pay | Admitting: Vascular Surgery

## 2023-04-26 ENCOUNTER — Ambulatory Visit (HOSPITAL_COMMUNITY): Payer: 59 | Admitting: Physician Assistant

## 2023-04-26 ENCOUNTER — Encounter (HOSPITAL_COMMUNITY)
Admission: RE | Disposition: A | Discharge status: Home / Self Care | Payer: Self-pay | Source: Home / Self Care | Attending: Vascular Surgery

## 2023-04-26 ENCOUNTER — Ambulatory Visit (HOSPITAL_BASED_OUTPATIENT_CLINIC_OR_DEPARTMENT_OTHER): Payer: 59 | Admitting: Physician Assistant

## 2023-04-26 ENCOUNTER — Ambulatory Visit (HOSPITAL_COMMUNITY)
Admission: RE | Admit: 2023-04-26 | Discharge: 2023-04-26 | Disposition: A | Discharge status: Home / Self Care | Payer: 59 | Attending: Vascular Surgery | Admitting: Vascular Surgery

## 2023-04-26 DIAGNOSIS — N186 End stage renal disease: Secondary | ICD-10-CM

## 2023-04-26 DIAGNOSIS — I120 Hypertensive chronic kidney disease with stage 5 chronic kidney disease or end stage renal disease: Secondary | ICD-10-CM | POA: Insufficient documentation

## 2023-04-26 DIAGNOSIS — N185 Chronic kidney disease, stage 5: Secondary | ICD-10-CM

## 2023-04-26 DIAGNOSIS — E1022 Type 1 diabetes mellitus with diabetic chronic kidney disease: Secondary | ICD-10-CM

## 2023-04-26 DIAGNOSIS — E1122 Type 2 diabetes mellitus with diabetic chronic kidney disease: Secondary | ICD-10-CM | POA: Diagnosis not present

## 2023-04-26 DIAGNOSIS — Z992 Dependence on renal dialysis: Secondary | ICD-10-CM

## 2023-04-26 HISTORY — PX: CAPD INSERTION: SHX5233

## 2023-04-26 HISTORY — DX: Hereditary hemolytic-uremic syndrome: D59.32

## 2023-04-26 HISTORY — DX: Anemia, unspecified: D64.9

## 2023-04-26 LAB — POCT I-STAT, CHEM 8
BUN: 27 mg/dL — ABNORMAL HIGH (ref 6–20)
Calcium, Ion: 1.01 mmol/L — ABNORMAL LOW (ref 1.15–1.40)
Chloride: 100 mmol/L (ref 98–111)
Creatinine, Ser: 9.8 mg/dL — ABNORMAL HIGH (ref 0.44–1.00)
Glucose, Bld: 198 mg/dL — ABNORMAL HIGH (ref 70–99)
HCT: 37 % (ref 36.0–46.0)
Hemoglobin: 12.6 g/dL (ref 12.0–15.0)
Potassium: 4.1 mmol/L (ref 3.5–5.1)
Sodium: 133 mmol/L — ABNORMAL LOW (ref 135–145)
TCO2: 23 mmol/L (ref 22–32)

## 2023-04-26 LAB — GLUCOSE, CAPILLARY
Glucose-Capillary: 195 mg/dL — ABNORMAL HIGH (ref 70–99)
Glucose-Capillary: 116 mg/dL — ABNORMAL HIGH (ref 70–99)

## 2023-04-26 LAB — HCG, SERUM, QUALITATIVE: Preg, Serum: NEGATIVE

## 2023-04-26 SURGERY — LAPAROSCOPIC INSERTION CONTINUOUS AMBULATORY PERITONEAL DIALYSIS  (CAPD) CATHETER
Anesthesia: General | Site: Abdomen

## 2023-04-26 MED ORDER — LIDOCAINE HCL (PF) 1 % IJ SOLN
INTRAMUSCULAR | Status: AC
  Filled 2023-04-26: qty 30

## 2023-04-26 MED ORDER — SUGAMMADEX SODIUM 200 MG/2ML IV SOLN
INTRAVENOUS | Status: DC | PRN
  Administered 2023-04-26: 200 mg via INTRAVENOUS

## 2023-04-26 MED ORDER — CHLORHEXIDINE GLUCONATE 4 % EX SOLN: 60.0000 mL | Freq: Once | CUTANEOUS | Status: DC

## 2023-04-26 MED ORDER — 0.9 % SODIUM CHLORIDE (POUR BTL) OPTIME
TOPICAL | Status: DC | PRN
  Administered 2023-04-26: 1000 mL

## 2023-04-26 MED ORDER — PHENYLEPHRINE HCL-NACL 20-0.9 MG/250ML-% IV SOLN
INTRAVENOUS | Status: DC | PRN
  Administered 2023-04-26: 20 ug/min via INTRAVENOUS

## 2023-04-26 MED ORDER — CHLORHEXIDINE GLUCONATE 0.12 % MT SOLN
15.0000 mL | OROMUCOSAL | Status: AC
  Administered 2023-04-26: 15 mL via OROMUCOSAL
  Filled 2023-04-26: qty 15

## 2023-04-26 MED ORDER — MIDAZOLAM HCL 2 MG/2ML IJ SOLN
INTRAMUSCULAR | Status: DC | PRN
  Administered 2023-04-26: 2 mg via INTRAVENOUS

## 2023-04-26 MED ORDER — OXYCODONE HCL 5 MG/5ML PO SOLN
ORAL | Status: AC
  Filled 2023-04-26: qty 5

## 2023-04-26 MED ORDER — ONDANSETRON HCL 4 MG/2ML IJ SOLN
INTRAMUSCULAR | Status: DC | PRN
  Administered 2023-04-26: 4 mg via INTRAVENOUS

## 2023-04-26 MED ORDER — MIDAZOLAM HCL 2 MG/2ML IJ SOLN
INTRAMUSCULAR | Status: AC
Start: 2023-04-26 — End: ?
  Filled 2023-04-26: qty 2

## 2023-04-26 MED ORDER — FENTANYL CITRATE (PF) 250 MCG/5ML IJ SOLN
INTRAMUSCULAR | Status: AC
  Filled 2023-04-26: qty 5

## 2023-04-26 MED ORDER — CEFAZOLIN SODIUM-DEXTROSE 2-4 GM/100ML-% IV SOLN
2.0000 g | INTRAVENOUS | Status: AC
  Administered 2023-04-26: 2 g via INTRAVENOUS
  Filled 2023-04-26: qty 100

## 2023-04-26 MED ORDER — SODIUM CHLORIDE 0.9 % IR SOLN
Status: DC | PRN
  Administered 2023-04-26: 1000 mL

## 2023-04-26 MED ORDER — PROPOFOL 10 MG/ML IV BOLUS
INTRAVENOUS | Status: DC | PRN
  Administered 2023-04-26: 150 mg via INTRAVENOUS

## 2023-04-26 MED ORDER — LIDOCAINE 2% (20 MG/ML) 5 ML SYRINGE
INTRAMUSCULAR | Status: DC | PRN
  Administered 2023-04-26: 80 mg via INTRAVENOUS

## 2023-04-26 MED ORDER — PROPOFOL 10 MG/ML IV BOLUS
INTRAVENOUS | Status: AC
  Filled 2023-04-26: qty 20

## 2023-04-26 MED ORDER — OXYCODONE-ACETAMINOPHEN 5-325 MG PO TABS: 1.0000 | ORAL_TABLET | Freq: Four times a day (QID) | ORAL | 0 refills | Status: DC | PRN

## 2023-04-26 MED ORDER — SODIUM CHLORIDE 0.9 % IV SOLN: INTRAVENOUS | Status: DC | PRN

## 2023-04-26 MED ORDER — DEXAMETHASONE SODIUM PHOSPHATE 10 MG/ML IJ SOLN
INTRAMUSCULAR | Status: DC | PRN
  Administered 2023-04-26: 4 mg via INTRAVENOUS

## 2023-04-26 MED ORDER — ROCURONIUM BROMIDE 10 MG/ML (PF) SYRINGE
PREFILLED_SYRINGE | INTRAVENOUS | Status: DC | PRN
  Administered 2023-04-26: 10 mg via INTRAVENOUS
  Administered 2023-04-26: 70 mg via INTRAVENOUS

## 2023-04-26 MED ORDER — PHENYLEPHRINE 80 MCG/ML (10ML) SYRINGE FOR IV PUSH (FOR BLOOD PRESSURE SUPPORT)
PREFILLED_SYRINGE | INTRAVENOUS | Status: DC | PRN
  Administered 2023-04-26: 160 ug via INTRAVENOUS

## 2023-04-26 MED ORDER — FENTANYL CITRATE (PF) 250 MCG/5ML IJ SOLN
INTRAMUSCULAR | Status: DC | PRN
  Administered 2023-04-26 (×2): 50 ug via INTRAVENOUS
  Administered 2023-04-26 (×2): 25 ug via INTRAVENOUS
  Administered 2023-04-26: 100 ug via INTRAVENOUS

## 2023-04-26 MED ORDER — SCOPOLAMINE 1 MG/3DAYS TD PT72SCOPOLAMINE 1 MG/3DAYS
1.0000 | MEDICATED_PATCH | TRANSDERMAL | Status: DC
Start: 2023-04-26 — End: 2023-04-26
  Administered 2023-04-26: 1.5 mg via TRANSDERMAL

## 2023-04-26 MED ORDER — OXYCODONE HCL 5 MG/5ML PO SOLN
5.0000 mg | Freq: Once | ORAL | Status: AC
  Administered 2023-04-26: 5 mg via ORAL

## 2023-04-26 MED ORDER — SCOPOLAMINE 1 MG/3DAYS TD PT72
1.0000 | MEDICATED_PATCH | TRANSDERMAL | Status: DC
  Filled 2023-04-26: qty 1

## 2023-04-26 MED ORDER — HEPARIN 6000 UNIT IRRIGATION SOLUTION
Status: AC
  Filled 2023-04-26: qty 500

## 2023-04-26 SURGICAL SUPPLY — 51 items
ADAPTER TITANIUM MEDIONICS (MISCELLANEOUS) ×2 IMPLANT
APPLIER CLIP 5 13 M/L LIGAMAX5 (MISCELLANEOUS) IMPLANT
BAG DECANTER FOR FLEXI CONT (MISCELLANEOUS) ×2 IMPLANT
BIOPATCH RED 1 DISK 7.0 (GAUZE/BANDAGES/DRESSINGS) ×2 IMPLANT
BLADE CLIPPER SURG (BLADE) IMPLANT
BLADE SURG 11 STRL SS (BLADE) ×2 IMPLANT
CATH EXTENDED DIALYSIS (CATHETERS) IMPLANT
CHLORAPREP W/TINT 26 (MISCELLANEOUS) ×2 IMPLANT
CLIP APPLIE 5 13 M/L LIGAMAX5 (MISCELLANEOUS) IMPLANT
COVER SURGICAL LIGHT HANDLE (MISCELLANEOUS) ×2 IMPLANT
DERMABOND ADVANCED .7 DNX12 (GAUZE/BANDAGES/DRESSINGS) ×2 IMPLANT
DEVICE TROCAR PUNCTURE CLOSURE (ENDOMECHANICALS) ×2 IMPLANT
DRSG TEGADERM 4X4.75 (GAUZE/BANDAGES/DRESSINGS) ×6 IMPLANT
ELECT REM PT RETURN 9FT ADLT (ELECTROSURGICAL) ×2 IMPLANT
ELECTRODE REM PT RTRN 9FT ADLT (ELECTROSURGICAL) ×2 IMPLANT
GAUZE SPONGE 4X4 12PLY STRL (GAUZE/BANDAGES/DRESSINGS) ×2 IMPLANT
GLOVE INDICATOR 6.5 STRL GRN (GLOVE) ×2 IMPLANT
GLOVE SURG UNDER LTX SZ7.5 (GLOVE) ×2 IMPLANT
GOWN STRL REUS W/ TWL LRG LVL3 (GOWN DISPOSABLE) ×4 IMPLANT
GOWN STRL REUS W/ TWL XL LVL3 (GOWN DISPOSABLE) ×2 IMPLANT
GRASPER SUT TROCAR 14GX15 (MISCELLANEOUS) ×2 IMPLANT
IRRIG SUCT STRYKERFLOW 2 WTIP (MISCELLANEOUS) IMPLANT
IRRIGATION SUCT STRKRFLW 2 WTP (MISCELLANEOUS) IMPLANT
IV NS 1000ML BAXH (IV SOLUTION) ×2 IMPLANT
KIT BASIN OR (CUSTOM PROCEDURE TRAY) ×2 IMPLANT
KIT TURNOVER KIT B (KITS) ×2 IMPLANT
NDL INSUFFLATION 14GA 120MM (NEEDLE) ×2 IMPLANT
NEEDLE INSUFFLATION 14GA 120MM (NEEDLE) ×2 IMPLANT
NS IRRIG 1000ML POUR BTL (IV SOLUTION) ×2 IMPLANT
PAD ARMBOARD 7.5X6 YLW CONV (MISCELLANEOUS) ×4 IMPLANT
POWDER SURGICEL 3.0 GRAM (HEMOSTASIS) IMPLANT
SCISSORS LAP 5X35 DISP (ENDOMECHANICALS) IMPLANT
SET CYSTO W/LG BORE CLAMP LF (SET/KITS/TRAYS/PACK) ×2 IMPLANT
SET EXT 12IN DIALYSIS STAY-SAF (MISCELLANEOUS) ×2 IMPLANT
SET TUBE SMOKE EVAC HIGH FLOW (TUBING) ×2 IMPLANT
SLEEVE Z-THREAD 5X100MM (TROCAR) ×4 IMPLANT
SPIKE FLUID TRANSFER (MISCELLANEOUS) ×2 IMPLANT
STYLET FALLER (MISCELLANEOUS) ×2 IMPLANT
STYLET FALLER MEDIONICS (MISCELLANEOUS) ×2 IMPLANT
SUT MNCRL AB 4-0 PS2 18 (SUTURE) ×2 IMPLANT
SUT PROLENE 0 SH 30 (SUTURE) ×4 IMPLANT
SUT SILK 0 TIES 10X30 (SUTURE) ×2 IMPLANT
SUT VICRYL 3 0 (SUTURE) IMPLANT
TOWEL GREEN STERILE (TOWEL DISPOSABLE) ×2 IMPLANT
TOWEL GREEN STERILE FF (TOWEL DISPOSABLE) ×2 IMPLANT
TRAY LAPAROSCOPIC MC (CUSTOM PROCEDURE TRAY) ×2 IMPLANT
TROCAR 11X100 Z THREAD (TROCAR) IMPLANT
TROCAR 5MMX150MM (TROCAR) ×2 IMPLANT
TROCAR XCEL NON-BLD 5MMX100MML (ENDOMECHANICALS) ×2 IMPLANT
TROCAR Z-THREAD OPTICAL 5X100M (TROCAR) ×2 IMPLANT
WATER STERILE IRR 1000ML POUR (IV SOLUTION) ×2 IMPLANT

## 2023-04-26 NOTE — Anesthesia Preprocedure Evaluation (Addendum)
Anesthesia Evaluation  Patient identified by MRN, date of birth, ID band Patient awake    Reviewed: Allergy & Precautions, NPO status , Patient's Chart, lab work & pertinent test results, reviewed documented beta blocker date and time   History of Anesthesia Complications (+) PONV and history of anesthetic complications  Airway Mallampati: II  TM Distance: >3 FB Neck ROM: Full    Dental no notable dental hx.    Pulmonary neg COPD   breath sounds clear to auscultation       Cardiovascular hypertension, (-) angina (-) Past MI  Rhythm:Regular Rate:Normal     Neuro/Psych neg Seizures TIA   GI/Hepatic ,neg GERD  ,,(+) neg Cirrhosis        Endo/Other  diabetes, Type 1, Insulin Dependent    Renal/GU ESRFRenal disease     Musculoskeletal   Abdominal   Peds  Hematology  (+) Blood dyscrasia, anemia   Anesthesia Other Findings   Reproductive/Obstetrics                             Anesthesia Physical Anesthesia Plan  ASA: 3  Anesthesia Plan: General   Post-op Pain Management:    Induction: Intravenous  PONV Risk Score and Plan: 3 and Ondansetron, Dexamethasone, Scopolamine patch - Pre-op and Propofol infusion  Airway Management Planned: Oral ETT  Additional Equipment:   Intra-op Plan:   Post-operative Plan: Extubation in OR  Informed Consent: I have reviewed the patients History and Physical, chart, labs and discussed the procedure including the risks, benefits and alternatives for the proposed anesthesia with the patient or authorized representative who has indicated his/her understanding and acceptance.     Dental advisory given  Plan Discussed with: CRNA  Anesthesia Plan Comments:        Anesthesia Quick Evaluation
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
## 2023-04-26 NOTE — Anesthesia Postprocedure Evaluation (Signed)
Anesthesia Post Note  Patient: JISELE LERER  Procedure(s) Performed: LAPAROSCOPIC INSERTION CONTINUOUS AMBULATORY PERITONEAL DIALYSIS  (CAPD) CATHETER LAPAROSCOPIC OMENTOPEXY (Abdomen)     Patient location during evaluation: PACU Anesthesia Type: General Level of consciousness: awake and alert Pain management: pain level controlled Vital Signs Assessment: post-procedure vital signs reviewed and stable Respiratory status: spontaneous breathing, nonlabored ventilation, respiratory function stable and patient connected to nasal cannula oxygen Cardiovascular status: blood pressure returned to baseline and stable Postop Assessment: no apparent nausea or vomiting Anesthetic complications: no   No notable events documented.  Last Vitals:  Vitals:   04/26/23 0915 04/26/23 0930  BP: (!) 142/85 137/83  Pulse: 83 81  Resp: 17 13  Temp:  (!) 36.3 C  SpO2: 98% 100%    Last Pain:  Vitals:   04/26/23 0915  PainSc: 0-No pain                 Mariann Barter

## 2023-04-26 NOTE — Op Note (Signed)
DATE OF SERVICE: 04/26/2023  PATIENT:  Kelly Huff  37 y.o. female  PRE-OPERATIVE DIAGNOSIS:  ESRD  POST-OPERATIVE DIAGNOSIS:  Same  PROCEDURE:   Laparoscopic peritoneal dialysis catheter placement  SURGEON:  Surgeons and Role:    * Leonie Douglas, MD - Primary  ASSISTANT: Nathanial Rancher, PA-C  An experienced assistant was required given the complexity of this procedure and the standard of surgical care. My assistant helped with exposure through counter tension, suctioning, ligation and retraction to better visualize the surgical field.  My assistant expedited sewing during the case by following my sutures. Wherever I use the term "we" in the report, my assistant actively helped me with that portion of the procedure.  ANESTHESIA:   general  EBL: minimal  BLOOD ADMINISTERED:none  DRAINS: none   LOCAL MEDICATIONS USED:  NONE  SPECIMEN:  none  COUNTS: confirmed correct.  TOURNIQUET:  none  PATIENT DISPOSITION:  PACU - hemodynamically stable.   Delay start of Pharmacological VTE agent (>24hrs) due to surgical blood loss or risk of bleeding: no  INDICATION FOR PROCEDURE: Kelly Huff is a 37 y.o. female with ESRD in need of permanent dialysis access. After careful discussion of risks, benefits, and alternatives the patient was offered laparoscopic PD catheter. The patient understood and wished to proceed.  OPERATIVE FINDINGS: mild adhesive disease. This did not prevent placement of catheter. Catheter working well at completion of case.  DESCRIPTION OF PROCEDURE: After identification of the patient in the pre-operative holding area, the patient was transferred to the operating room. The patient was positioned supine on the operating room table. Anesthesia was induced. The abdomen was prepped and draped in standard fashion. A surgical pause was performed confirming correct patient, procedure, and operative location.  A Veress needle was introduced into the abdomen at  Palmer's point, immediately below the left costal margin.  A saline drop test was used to confirm intra-abdominal position.  The Veress needle was connected to insufflation tubing and insufflation initiated.  A low opening pressure and good flow rate were noted.  A 5 mm trocar was introduced into the left upper quadrant using Visi-View technique.  The obturator was removed and the abdomen inspected with a 30 degree angled laparoscope.  No evidence of Veress needle injury was noted.  An additional 5 mm trocar was inserted a handsbreadth away to facilitate the case.  The abdomen was desufflated.  The peritoneal catheter was measured across the abdominal wall surface.  A mark was made by the cuff in the periumbilical abdomen.  The abdomen was reinsufflated.  An advantage 5 mm trocar was then used to create a skiving path through the anterior rectus fascia, rectus muscle, and peritoneum.  The laparoscopic trocar entered in the suprapubic abdomen directing towards the pelvis.  The working end of the catheter was delivered through the trocar.  The cuff was brought into the abdomen and then placed back into the rectus muscle.  The abdomen was desufflated again.  A "swan-neck" extension tubing for the dialysis catheter was brought onto the field.  The catheter was laid across the desufflated abdomen to lay across the upper quadrant and exit the right abdomen.  The catheter was cut.  The 2 pieces of catheter were then [facility] using a Christmas tree adapter.  This was secured in place with 2 interrupted sutures of 0 Prolene.  The connected catheter was then tunneled to a counterincision in the upper quadrant and then out the lateral abdomen in a downward deflection.  Great care was taken to avoid twisting or kinking the catheter.  The abdomen was reinsufflated.  The peritoneum was inspected to ensure the catheter did not enter the cavity.  Satisfied we desufflated the abdomen.  The catheter was connected to cystoscopy  tubing.  The catheter flushed and drained without any difficulty.  The trochars were removed.  All incisions were closed with interrupted 4-0 Monocryl sutures.  Dermabond was applied.  A sterile bandage was applied to the peritoneal dialysis catheter.   Upon completion of the case instrument and sharps counts were confirmed correct. The patient was transferred to the PACU in good condition. I was present for all portions of the procedure.  Rande Brunt. Lenell Antu, MD [facility] Phone Number: [phone] 04/26/2023 9:38 AM

## 2023-04-26 NOTE — Transfer of Care (Signed)
Immediate Anesthesia Transfer of Care Note  Patient: Kelly Huff  Procedure(s) Performed: LAPAROSCOPIC INSERTION CONTINUOUS AMBULATORY PERITONEAL DIALYSIS  (CAPD) CATHETER LAPAROSCOPIC OMENTOPEXY (Abdomen)  Patient Location: PACU  Anesthesia Type:General  Level of Consciousness: awake, alert , and oriented  Airway & Oxygen Therapy: Patient Spontanous Breathing  Post-op Assessment: Report given to RN and Post -op Vital signs reviewed and stable  Post vital signs: Reviewed and stable  Last Vitals:  Vitals Value Taken Time  BP 142/82 04/26/23 0851  Temp    Pulse 76 04/26/23 0853  Resp 10 04/26/23 0853  SpO2 100 % 04/26/23 0853  Vitals shown include unfiled device data.  Last Pain:  Vitals:   04/26/23 0646  PainSc: 0-No pain      Patients Stated Pain Goal: 0 (04/26/23 0646)  Complications: No notable events documented.

## 2023-04-26 NOTE — Anesthesia Procedure Notes (Signed)
Procedure Name: Intubation Date/Time: 04/26/2023 7:41 AM  Performed by: Sandie Ano, CRNAPre-anesthesia Checklist: Patient identified, Emergency Drugs available, Suction available and Patient being monitored Patient Re-evaluated:Patient Re-evaluated prior to induction Oxygen Delivery Method: Circle System Utilized Preoxygenation: Pre-oxygenation with 100% oxygen Induction Type: IV induction Ventilation: Mask ventilation without difficulty Laryngoscope Size: Mac and 3 Grade View: Grade I Tube type: Oral Tube size: 7.0 mm Number of attempts: 1 Airway Equipment and Method: Stylet and Oral airway Placement Confirmation: ETT inserted through vocal cords under direct vision, positive ETCO2 and breath sounds checked- equal and bilateral Secured at: 23 cm Tube secured with: Tape Dental Injury: Teeth and Oropharynx as per pre-operative assessment

## 2023-04-27 ENCOUNTER — Encounter (HOSPITAL_COMMUNITY): Payer: Self-pay | Admitting: Vascular Surgery

## 2023-05-05 ENCOUNTER — Encounter: Payer: Self-pay | Admitting: Internal Medicine

## 2023-05-05 ENCOUNTER — Other Ambulatory Visit: Payer: Self-pay | Admitting: Internal Medicine

## 2023-05-05 ENCOUNTER — Other Ambulatory Visit: Payer: Self-pay | Admitting: *Deleted

## 2023-05-05 DIAGNOSIS — D5939 Other hemolytic-uremic syndrome: Secondary | ICD-10-CM

## 2023-05-05 NOTE — Progress Notes (Signed)
 DISCONTINUE OFF PATHWAY REGIMEN - [Other Dx]   [id number]:$MzfnczAzqnmzIZPI_krIXVuBPioLdMmbabjoACcHHNhjYcesN$$MzfnczAzqnmzIZPI_krIXVuBPioLdMmbabjoACcHHNhjYcesN$  Maintenance every 14 days ([facility]; **2 cycles per order sheet**):   A cycle is every 14 days:     Eculizumab    **Always confirm dose/schedule in your pharmacy ordering system**  PRIOR TREATMENT: Eculizumab  Maintenance every 14 days ([facility]; **2 cycles per order sheet**)  START OFF PATHWAY REGIMEN - Other   [id number]:Ravulizumab  2,400 mg IV/3,000 mg IV q56 Days (IV Loading Dose + IV Maintenance Dose):   Cycle 1: A cycle is 14 days:     Ravulizumab -cwyz    Cycles 2 and beyond: A cycle is every 56 days:     Ravulizumab -cwyz   **Always confirm dose/schedule in your pharmacy ordering system**  Patient Characteristics: Intent of Therapy: Non-Curative / Palliative Intent, Discussed with Patient

## 2023-05-06 ENCOUNTER — Other Ambulatory Visit: Payer: Self-pay | Admitting: Nurse Practitioner

## 2023-05-06 ENCOUNTER — Other Ambulatory Visit (HOSPITAL_COMMUNITY): Payer: Self-pay | Admitting: Pharmacy Technician

## 2023-05-06 ENCOUNTER — Encounter (HOSPITAL_COMMUNITY): Payer: Self-pay | Admitting: Internal Medicine

## 2023-05-06 DIAGNOSIS — D5939 Other hemolytic-uremic syndrome: Secondary | ICD-10-CM

## 2023-05-06 NOTE — Progress Notes (Signed)
 Spoke to [place]. Patient will be eligible for improved pricing if infusion is given at [facility]. I have adjusted her treatment plan to reflect this.

## 2023-05-14 DIAGNOSIS — Z79899 Other long term (current) drug therapy: Secondary | ICD-10-CM | POA: Insufficient documentation

## 2023-05-14 DIAGNOSIS — K769 Liver disease, unspecified: Secondary | ICD-10-CM | POA: Insufficient documentation

## 2023-05-14 DIAGNOSIS — N2589 Other disorders resulting from impaired renal tubular function: Secondary | ICD-10-CM | POA: Insufficient documentation

## 2023-05-14 DIAGNOSIS — E878 Other disorders of electrolyte and fluid balance, not elsewhere classified: Secondary | ICD-10-CM | POA: Insufficient documentation

## 2023-05-14 DIAGNOSIS — Z794 Long term (current) use of insulin: Secondary | ICD-10-CM | POA: Insufficient documentation

## 2023-05-14 DIAGNOSIS — E872 Acidosis, unspecified: Secondary | ICD-10-CM | POA: Insufficient documentation

## 2023-05-14 DIAGNOSIS — D513 Other dietary vitamin B12 deficiency anemia: Secondary | ICD-10-CM | POA: Insufficient documentation

## 2023-05-21 ENCOUNTER — Inpatient Hospital Stay (HOSPITAL_BASED_OUTPATIENT_CLINIC_OR_DEPARTMENT_OTHER): Payer: 59 | Admitting: Internal Medicine

## 2023-05-21 ENCOUNTER — Inpatient Hospital Stay: Payer: 59 | Attending: Internal Medicine

## 2023-05-21 VITALS — BP 129/88 | HR 84 | Temp 97.4°F | Resp 18 | Wt 170.0 lb

## 2023-05-21 DIAGNOSIS — D5939 Other hemolytic-uremic syndrome: Secondary | ICD-10-CM

## 2023-05-21 DIAGNOSIS — I120 Hypertensive chronic kidney disease with stage 5 chronic kidney disease or end stage renal disease: Secondary | ICD-10-CM | POA: Insufficient documentation

## 2023-05-21 DIAGNOSIS — E1122 Type 2 diabetes mellitus with diabetic chronic kidney disease: Secondary | ICD-10-CM

## 2023-05-21 DIAGNOSIS — Z79899 Other long term (current) drug therapy: Secondary | ICD-10-CM | POA: Diagnosis not present

## 2023-05-21 DIAGNOSIS — N186 End stage renal disease: Secondary | ICD-10-CM | POA: Diagnosis not present

## 2023-05-21 LAB — CBC WITH DIFFERENTIAL (CANCER CENTER ONLY)
Abs Immature Granulocytes: 0.01 10*3/uL (ref 0.00–0.07)
Basophils Absolute: 0.1 10*3/uL (ref 0.0–0.1)
Basophils Relative: 1 %
Eosinophils Absolute: 0.3 10*3/uL (ref 0.0–0.5)
Eosinophils Relative: 5 %
HCT: 37.3 % (ref 36.0–46.0)
Hemoglobin: 12 g/dL (ref 12.0–15.0)
Immature Granulocytes: 0 %
Lymphocytes Relative: 18 %
Lymphs Abs: 1 10*3/uL (ref 0.7–4.0)
MCH: 28.9 pg (ref 26.0–34.0)
MCHC: 32.2 g/dL (ref 30.0–36.0)
MCV: 89.9 fL (ref 80.0–100.0)
Monocytes Absolute: 0.5 10*3/uL (ref 0.1–1.0)
Monocytes Relative: 9 %
Neutro Abs: 3.7 10*3/uL (ref 1.7–7.7)
Neutrophils Relative %: 67 %
Platelet Count: 312 10*3/uL (ref 150–400)
RBC: 4.15 MIL/uL (ref 3.87–5.11)
RDW: 14.2 % (ref 11.5–15.5)
WBC Count: 5.5 10*3/uL (ref 4.0–10.5)
nRBC: 0 % (ref 0.0–0.2)

## 2023-05-21 LAB — CMP (CANCER CENTER ONLY)
ALT: 23 U/L (ref 0–44)
AST: 23 U/L (ref 15–41)
Albumin: 3.7 g/dL (ref 3.5–5.0)
Alkaline Phosphatase: 187 U/L — ABNORMAL HIGH (ref 38–126)
Anion gap: 14 (ref 5–15)
BUN: 43 mg/dL — ABNORMAL HIGH (ref 6–20)
CO2: 25 mmol/L (ref 22–32)
Calcium: 9.2 mg/dL (ref 8.9–10.3)
Chloride: 93 mmol/L — ABNORMAL LOW (ref 98–111)
Creatinine: 11.15 mg/dL (ref 0.44–1.00)
GFR, Estimated: 4 mL/min — ABNORMAL LOW (ref >60)
Glucose, Bld: 215 mg/dL — ABNORMAL HIGH (ref 70–99)
Potassium: 4.8 mmol/L (ref 3.5–5.1)
Sodium: 132 mmol/L — ABNORMAL LOW (ref 135–145)
Total Bilirubin: 0.5 mg/dL (ref 0.0–1.2)
Total Protein: 7.5 g/dL (ref 6.5–8.1)

## 2023-05-21 LAB — RETIC PANEL
Immature Retic Fract: 7 % (ref 2.3–15.9)
RBC.: 4.18 MIL/uL (ref 3.87–5.11)
Retic Count, Absolute: 60.2 10*3/uL (ref 19.0–186.0)
Retic Ct Pct: 1.4 % (ref 0.4–3.1)
Reticulocyte Hemoglobin: 31.1 pg (ref >27.9)

## 2023-05-21 LAB — LACTATE DEHYDROGENASE: LDH: 228 U/L — ABNORMAL HIGH (ref 98–192)

## 2023-05-21 NOTE — Assessment & Plan Note (Addendum)
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  Stable [see below]  Platelets/LDH are normal.  Continue Ultimoris infusions every 8 weeks; continue indefinitely at this time.  # Anemia- multifactorial- 11.1. [do not suspect secondary to atypical HUS]; likely secondary to ESRD  on procrit/Iron; JAN 2022-iron saturation 14%; ferritin 900+.  Today-awaiting iron studies/ferritin; haptoglobin today.  # ESRD- on PD-  stable [awaiting transplant list].   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   # vaccine: s/p meningococcal [[facility]]-reminded the patient to speak to [facility] hematology regarding the type of meningococcal vaccination dates for consideration of the booster vaccination.   # DISPOSITION: # infusion treatment on 1/27  in [place] # follow up in 12 weeks- MD: labs- cbc/cmp; [facility]-Dr.B
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
## 2023-05-21 NOTE — Progress Notes (Signed)
Pt has fatigue; Has been on dialysis for 5 years Going to get on [facility] wait list for pancreas /kidney transplant . Has not started infusions yet.Appt on 05/24/23 for ultomiris. Critical lab  called ~ creatinine 11.15. Readback to [facility] from lab; Dr Donneta Romberg notified.

## 2023-05-23 LAB — HAPTOGLOBIN: Haptoglobin: 122 mg/dL (ref 33–278)

## 2023-05-24 ENCOUNTER — Ambulatory Visit (HOSPITAL_COMMUNITY)
Admission: RE | Admit: 2023-05-24 | Discharge: 2023-05-24 | Disposition: A | Discharge status: Home / Self Care | Payer: 59 | Source: Ambulatory Visit | Attending: Internal Medicine | Admitting: Internal Medicine

## 2023-05-24 DIAGNOSIS — D5939 Other hemolytic-uremic syndrome: Secondary | ICD-10-CM | POA: Insufficient documentation

## 2023-05-24 MED ORDER — SODIUM CHLORIDE 0.9 % IV SOLN: 3300.0000 mg | INTRAVENOUS | Status: DC

## 2023-05-24 MED ORDER — SODIUM CHLORIDE 0.9 % IV SOLN
3300.0000 mg | INTRAVENOUS | Status: DC
  Administered 2023-05-24: 3300 mg via INTRAVENOUS
  Filled 2023-05-24: qty 33

## 2023-05-26 ENCOUNTER — Ambulatory Visit: Payer: 59 | Admitting: Dermatology

## 2023-05-26 ENCOUNTER — Encounter: Payer: Self-pay | Admitting: Dermatology

## 2023-05-26 VITALS — BP 110/68

## 2023-05-26 DIAGNOSIS — L280 Lichen simplex chronicus: Secondary | ICD-10-CM

## 2023-05-26 DIAGNOSIS — L810 Postinflammatory hyperpigmentation: Secondary | ICD-10-CM

## 2023-05-26 DIAGNOSIS — L281 Prurigo nodularis: Secondary | ICD-10-CM | POA: Diagnosis not present

## 2023-05-26 HISTORY — DX: Lichen simplex chronicus: L28.0

## 2023-05-26 MED ORDER — SAFETY SEAL MISCELLANEOUS MISC: 1.0000 | Freq: Every evening | 7 refills | Status: DC

## 2023-05-26 MED ORDER — TACROLIMUS 0.1 % EX OINT: TOPICAL_OINTMENT | Freq: Two times a day (BID) | CUTANEOUS | 5 refills | Status: DC

## 2023-05-26 NOTE — Progress Notes (Unsigned)
New Patient Visit   Subjective  Kelly Huff is a 38 y.o. female who presents for the following: PIH  Patient states she has PIH located at the scattered that she would like to have examined. Patient reports the areas have been there for 2 years. She reports the areas are not bothersome.Patient rates irritation 0 out of 10. She states that the areas have not spread. Patient reports she has previously been treated for these areas. She was tretinoin 0.05%, clindamycin cream, Azelaic Acid and she washes with Cerave facial washes and moisturizers.  The patient has spots, moles and lesions to be evaluated, some may be new or changing and the patient may have concern these could be cancer.  The following portions of the chart were reviewed this encounter and updated as appropriate: medications, allergies, medical history  Review of Systems:  No other skin or systemic complaints except as noted in HPI or Assessment and Plan.  Objective  Well appearing patient in no apparent distress; mood and affect are within normal limits.  A full examination was performed including scalp, head, eyes, ears, nose, lips, neck, chest, axillae, abdomen, back, buttocks, bilateral upper extremities, bilateral lower extremities, hands, feet, fingers, toes, fingernails, and toenails. All findings within normal limits unless otherwise noted below.   Relevant exam findings are noted in the Assessment and Plan.    Assessment & Plan   PRURIGO NODULARIS/LICHEN SIMPLEX CHRONICUS Exam: Excoriated papules and nodules scattered throughout the body, 30 Nodules noted on exam today  Flared  Lichen simplex chronicus (LSC) is a persistent itchy area of thickened skin that is induced by chronic rubbing and/or scratching (chronic dermatitis).  These areas may be pink, hyperpigmented and may have excoriations and bumps (prurigo nodules-PN).  PN/LSC is commonly observed in uncontrolled atopic dermatitis and other forms of  eczema, and in other itchy skin conditions (eg, insect bites, scabies).  Sometimes it is not possible to know initial cause of PN/LSC if it has been present for a long time.  It generally responds well to treatment with high potency topical steroids.  It is important to stop rubbing/scratching the area in order to break the itch-scratch-rash-itch cycle, in order for the rash to resolve.   Treatment Plan: - We will plan to prescribe Tacrolimus ointment to apply 2 times daily when you get the urge to pick or scratch - Avoid picking/rubbing/scratching  POST-INFLAMMATORY HYPERPIGMENTATION (PIH) Exam: hyperpigmented macules and/or patches scattered throughout the body   This is a benign condition that comes from having previous inflammation in the skin and will fade with time over months to sometimes years. Recommend daily sun protection including sunscreen SPF 30+ to sun-exposed areas. - Recommend treating any itchy or red areas on the skin quickly to prevent new areas of PIH. Treating with prescription medicines such as hydroquinone may help fade dark spots faster.    Treatment Plan: - We will plan to prescribe Melaxemic Cream to [facility] - We will plan to follow up in 4 months to re-asses  PRURIGO NODULARIS   Related Medications tacrolimus (PROTOPIC) 0.1 % ointment Apply topically 2 (two) times daily. POST-INFLAMMATORY HYPERPIGMENTATION   Related Medications Safety Seal Miscellaneous MISC Apply 1 Application topically at bedtime. Medication Name: Melaxemic Cream (Tranexamic Acid 5%, Kojic Acid 2%, Vit C 2.5%, Hyaluronic Acid 0.1%)  Return in about 4 months (around 09/23/2023) for PIH & Prurigo Nodularis f/u.  Documentation: I have reviewed the above documentation for accuracy and completeness, and I agree with the above.  Stasia Cavalier, am acting as scribe for Langston Reusing, DO.  Langston Reusing, DO

## 2023-05-26 NOTE — Patient Instructions (Addendum)
Hello Miss Kelly Huff,  Thank you for visiting my office today. Below is a summary of the essential instructions we covered during our consultation:  Medications Prescribed:   Tacrolimus Cream: Mix with lotion and apply to the affected areas to alleviate inflammation associated with prurigo nodularis.   Lightening Cream: This cream, containing tranexamic acid, vitamin C, kojic acid, and niacinamide, should be applied as instructed to aid in lightening dark spots. Improvements are anticipated in approximately three months.   Tretinoin Cream: Continue applying a pea-sized amount to your face only, 2-3 nights a week, for anti-aging benefits and to assist in fading dark spots.  Lifestyle Changes:   Engage in stress-relief activities, such as walking and meditating, to manage the urge to pick at your skin.   Include antioxidants and omega fatty acids in your diet.   Consider a magnesium supplement for nerve health.  Sun Protection:   Regular use of sunscreen is advised to prevent further darkening of spots.  Follow-Up:   Please return for a follow-up appointment in four months to evaluate the treatment's effectiveness and to review your progress.  Additional Information:   The lightening cream will be prepared by a compounding pharmacy and mailed to you after your address is confirmed.   The tacrolimus cream will be sent to your regular pharmacy.  Please discontinue using triamcinolone or tretinoin on the body as they may be overly drying. Should you have any questions or concerns before our next meeting, please feel free to contact the office.  Warm regards,  Dr. [facility] Reusing [facility]    Important Information   Due to recent changes in healthcare laws, you may see results of your pathology and/or laboratory studies on MyChart before the doctors have had a chance to review them. We understand that in some cases there may be results that are confusing or concerning to you. Please  understand that not all results are received at the same time and often the doctors may need to interpret multiple results in order to provide you with the best plan of care or course of treatment. Therefore, we ask that you please give [place] 2 business days to thoroughly review all your results before contacting the office for clarification. Should we see a critical lab result, you will be contacted sooner.     If You Need Anything After Your Visit   If you have any questions or concerns for your doctor, please call our main line at [phone]. If no one answers, please leave a voicemail as directed and we will return your call as soon as possible. Messages left after 4 pm will be answered the following business day.    You may also send [place] a message via MyChart. We typically respond to MyChart messages within 1-2 business days.  For prescription refills, please ask your pharmacy to contact our office. Our fax number is [phone].  If you have an urgent issue when the clinic is closed that cannot wait until the next business day, you can page your doctor at the number below.     Please note that while we do our best to be available for urgent issues outside of office hours, we are not available 24/7.    If you have an urgent issue and are unable to reach [place], you may choose to seek medical care at your doctor's office, retail clinic, urgent care center, or emergency room.   If you have a medical emergency, please immediately call 911 or go to  the emergency department. In the event of inclement weather, please call our main line at [phone] for an update on the status of any delays or closures.  Dermatology Medication Tips: Please keep the boxes that topical medications come in in order to help keep track of the instructions about where and how to use these. Pharmacies typically print the medication instructions only on the boxes and not directly on the medication tubes.   If your  medication is too expensive, please contact our office at [phone] or send [place] a message through MyChart.    We are unable to tell what your co-pay for medications will be in advance as this is different depending on your insurance coverage. However, we may be able to find a substitute medication at lower cost or fill out paperwork to get insurance to cover a needed medication.    If a prior authorization is required to get your medication covered by your insurance company, please allow [place] 1-2 business days to complete this process.   Drug prices often vary depending on where the prescription is filled and some pharmacies may offer cheaper prices.   The website www.goodrx.com contains coupons for medications through different pharmacies. The prices here do not account for what the cost may be with help from insurance (it may be cheaper with your insurance), but the website can give you the price if you did not use any insurance.  - You can print the associated coupon and take it with your prescription to the pharmacy.  - You may also stop by our office during regular business hours and pick up a GoodRx coupon card.  - If you need your prescription sent electronically to a different pharmacy, notify our office through [facility] or by phone at [phone]

## 2023-05-28 ENCOUNTER — Telehealth: Payer: Self-pay | Admitting: *Deleted

## 2023-05-28 NOTE — Telephone Encounter (Signed)
Received a phone call from [place] with [facility]; She was inquiring about the progress of Dr B signing into [facility] program. I informed her that  Dr B and myself actually did that this morning. So we should be all  set for the patient to receive her infusions in [facility]- Infusion (at [facility]).

## 2023-06-14 ENCOUNTER — Encounter (HOSPITAL_COMMUNITY): Payer: Self-pay | Admitting: Internal Medicine

## 2023-06-17 ENCOUNTER — Other Ambulatory Visit: Payer: Self-pay | Admitting: Internal Medicine

## 2023-06-17 ENCOUNTER — Encounter (HOSPITAL_COMMUNITY): Payer: Self-pay | Admitting: Internal Medicine

## 2023-06-28 ENCOUNTER — Telehealth: Payer: Self-pay | Admitting: Internal Medicine

## 2023-06-28 NOTE — Telephone Encounter (Signed)
Error
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
## 2023-07-07 NOTE — Patient Instructions (Signed)
 Breast Self-Awareness Breast self-awareness is knowing how your breasts look and feel. You need to: Check your breasts on a regular basis. Tell your doctor about any changes. Become familiar with the look and feel of your breasts. This can help you catch a breast problem while it is still small and can be treated. You should do breast self-exams even if you have breast implants. What you need: A mirror. A well-lit room. A pillow or other soft object. How to do a breast self-exam Follow these steps to do a breast self-exam: Look for changes  Take off all the clothes above your waist. Stand in front of a mirror in a room with good lighting. Put your hands down at your sides. Compare your breasts in the mirror. Look for any difference between them, such as: A difference in shape. A difference in size. Wrinkles, dips, and bumps in one breast and not the other. Look at each breast for changes in the skin, such as: Redness. Scaly areas. Skin that has gotten thicker. Dimpling. Open sores (ulcers). Look for changes in your nipples, such as: Fluid coming out of a nipple. Fluid around a nipple. Bleeding. Dimpling. Redness. A nipple that looks pushed in (retracted), or that has changed position. Feel for changes Lie on your back. Feel each breast. To do this: Pick a breast to feel. Place a pillow under the shoulder closest to that breast. Put the arm closest to that breast behind your head. Feel the nipple area of that breast using the hand of your other arm. Feel the area with the pads of your three middle fingers by making small circles with your fingers. Use light, medium, and firm pressure. Continue the overlapping circles, moving downward over the breast. Keep making circles with your fingers. Stop when you feel your ribs. Start making circles with your fingers again, this time going upward until you reach your collarbone. Then, make circles outward across your breast and into your  armpit area. Squeeze your nipple. Check for discharge and lumps. Repeat these steps to check your other breast. Sit or stand in the tub or shower. With soapy water on your skin, feel each breast the same way you did when you were lying down. Write down what you find Writing down what you find can help you remember what to tell your doctor. Write down: What is normal for each breast. Any changes you find in each breast. These include: The kind of changes you find. A tender or painful breast. Any lump you find. Write down its size and where it is. When you last had your monthly period (menstrual cycle). General tips If you are breastfeeding, the best time to check your breasts is after you feed your baby or after you use a breast pump. If you get monthly bleeding, the best time to check your breasts is 5-7 days after your monthly cycle ends. With time, you will become comfortable with the self-exam. You will also start to know if there are changes in your breasts. Contact a doctor if: You see a change in the shape or size of your breasts or nipples. You see a change in the skin of your breast or nipples, such as red or scaly skin. You have fluid coming from your nipples that is not normal. You find a new lump or thick area. You have breast pain. You have any concerns about your breast health. Summary Breast self-awareness includes looking for changes in your breasts and feeling for changes  within your breasts. You should do breast self-awareness in front of a mirror in a well-lit room. If you get monthly periods (menstrual cycles), the best time to check your breasts is 5-7 days after your period ends. Tell your doctor about any changes you see in your breasts. Changes include changes in size, changes on the skin, painful or tender breasts, or fluid from your nipples that is not normal. This information is not intended to replace advice given to you by your health care provider. Make sure  you discuss any questions you have with your health care provider. Document Revised: 09/18/2021 Document Reviewed: 02/13/2021 Elsevier Patient Education  2024 [employer].

## 2023-07-08 ENCOUNTER — Encounter: Payer: Self-pay | Admitting: Obstetrics and Gynecology

## 2023-07-08 ENCOUNTER — Ambulatory Visit: Payer: 59 | Admitting: Obstetrics and Gynecology

## 2023-07-08 VITALS — BP 143/77 | HR 101 | Resp 16 | Ht 67.0 in | Wt 175.0 lb

## 2023-07-08 DIAGNOSIS — Z01419 Encounter for gynecological examination (general) (routine) without abnormal findings: Secondary | ICD-10-CM | POA: Diagnosis not present

## 2023-07-08 DIAGNOSIS — Z1231 Encounter for screening mammogram for malignant neoplasm of breast: Secondary | ICD-10-CM

## 2023-07-08 DIAGNOSIS — Z8742 Personal history of other diseases of the female genital tract: Secondary | ICD-10-CM

## 2023-07-09 ENCOUNTER — Encounter: Payer: Self-pay | Admitting: Obstetrics and Gynecology

## 2023-07-19 ENCOUNTER — Inpatient Hospital Stay (HOSPITAL_COMMUNITY): Admission: RE | Admit: 2023-07-19 | Payer: 59 | Source: Ambulatory Visit

## 2023-08-09 ENCOUNTER — Telehealth: Payer: Self-pay | Admitting: Internal Medicine

## 2023-08-09 NOTE — Telephone Encounter (Signed)
 Pt needs to r/s lab and MD appt, her kids are out of school on good Friday and that is the date of her appts. The next MD opening is April 29th. Pt stated she has an infusion in [place] on 4/21 and has to have the labs before then. Please advise and call pt back.

## 2023-08-10 ENCOUNTER — Telehealth (HOSPITAL_COMMUNITY): Payer: Self-pay | Admitting: Pharmacy Technician

## 2023-08-10 NOTE — Telephone Encounter (Signed)
 Patient scheduled for Ultomiris infusion on 08/16/23. [employer] dispense authorization generated. RDA - [id number]

## 2023-08-11 ENCOUNTER — Telehealth: Payer: Self-pay

## 2023-08-11 ENCOUNTER — Encounter: Payer: Self-pay | Admitting: Internal Medicine

## 2023-08-11 NOTE — Telephone Encounter (Signed)
 Auth Submission: APPROVED Site of care: Site of care: [facility] Payer: [facility] Medicare Medication & CPT/J Code(s) submitted: Ultomiris  Route of submission (phone, fax, portal): portal Phone # Fax # Auth type: Buy/Bill PB Units/visits requested: 3300mg , q12weeks Reference number: [id number] Approval from: 08/11/23 to 08/10/24    Authorization is scanned in the media tab.

## 2023-08-12 ENCOUNTER — Telehealth: Payer: Self-pay

## 2023-08-12 ENCOUNTER — Inpatient Hospital Stay: Attending: Internal Medicine

## 2023-08-12 DIAGNOSIS — D5939 Other hemolytic-uremic syndrome: Secondary | ICD-10-CM | POA: Insufficient documentation

## 2023-08-12 LAB — CBC WITH DIFFERENTIAL (CANCER CENTER ONLY)
Abs Immature Granulocytes: 0.02 10*3/uL (ref 0.00–0.07)
Basophils Absolute: 0.1 10*3/uL (ref 0.0–0.1)
Basophils Relative: 1 %
Eosinophils Absolute: 0.4 10*3/uL (ref 0.0–0.5)
Eosinophils Relative: 6 %
HCT: 35.9 % — ABNORMAL LOW (ref 36.0–46.0)
Hemoglobin: 11.6 g/dL — ABNORMAL LOW (ref 12.0–15.0)
Immature Granulocytes: 0 %
Lymphocytes Relative: 16 %
Lymphs Abs: 1 10*3/uL (ref 0.7–4.0)
MCH: 28.8 pg (ref 26.0–34.0)
MCHC: 32.3 g/dL (ref 30.0–36.0)
MCV: 89.1 fL (ref 80.0–100.0)
Monocytes Absolute: 0.4 10*3/uL (ref 0.1–1.0)
Monocytes Relative: 7 %
Neutro Abs: 4.1 10*3/uL (ref 1.7–7.7)
Neutrophils Relative %: 70 %
Platelet Count: 349 10*3/uL (ref 150–400)
RBC: 4.03 MIL/uL (ref 3.87–5.11)
RDW: 13.3 % (ref 11.5–15.5)
WBC Count: 5.9 10*3/uL (ref 4.0–10.5)
nRBC: 0 % (ref 0.0–0.2)

## 2023-08-12 LAB — CMP (CANCER CENTER ONLY)
ALT: 20 U/L (ref 0–44)
AST: 23 U/L (ref 15–41)
Albumin: 3.1 g/dL — ABNORMAL LOW (ref 3.5–5.0)
Alkaline Phosphatase: 161 U/L — ABNORMAL HIGH (ref 38–126)
Anion gap: 12 (ref 5–15)
BUN: 30 mg/dL — ABNORMAL HIGH (ref 6–20)
CO2: 27 mmol/L (ref 22–32)
Calcium: 8.7 mg/dL — ABNORMAL LOW (ref 8.9–10.3)
Chloride: 94 mmol/L — ABNORMAL LOW (ref 98–111)
Creatinine: 10.52 mg/dL (ref 0.44–1.00)
GFR, Estimated: 4 mL/min — ABNORMAL LOW (ref >60)
Glucose, Bld: 148 mg/dL — ABNORMAL HIGH (ref 70–99)
Potassium: 4.4 mmol/L (ref 3.5–5.1)
Sodium: 133 mmol/L — ABNORMAL LOW (ref 135–145)
Total Bilirubin: 0.7 mg/dL (ref 0.0–1.2)
Total Protein: 6.7 g/dL (ref 6.5–8.1)

## 2023-08-12 LAB — LACTATE DEHYDROGENASE: LDH: 163 U/L (ref 98–192)

## 2023-08-12 NOTE — Telephone Encounter (Signed)
 Spoke with [place] in the lab.  Critical alert: Creatinine 10.5 (read back completed).  Critical alert value already communicated via secure chat with MD group as well.

## 2023-08-13 ENCOUNTER — Ambulatory Visit: Payer: 59 | Admitting: Internal Medicine

## 2023-08-13 ENCOUNTER — Other Ambulatory Visit: Payer: 59

## 2023-08-16 ENCOUNTER — Ambulatory Visit (HOSPITAL_COMMUNITY)
Admission: RE | Admit: 2023-08-16 | Discharge: 2023-08-16 | Disposition: A | Discharge status: Home / Self Care | Payer: 59 | Source: Ambulatory Visit | Attending: *Deleted | Admitting: *Deleted

## 2023-08-16 DIAGNOSIS — D5939 Other hemolytic-uremic syndrome: Secondary | ICD-10-CM | POA: Diagnosis present

## 2023-08-16 MED ORDER — RAVULIZUMAB-CWVZ 300 MG/3ML IV SOLN
3300.0000 mg | INTRAVENOUS | Status: DC
  Administered 2023-08-16: 3300 mg via INTRAVENOUS
  Filled 2023-08-16: qty 33

## 2023-08-26 ENCOUNTER — Inpatient Hospital Stay: Attending: Internal Medicine | Admitting: Internal Medicine

## 2023-08-26 ENCOUNTER — Encounter: Payer: Self-pay | Admitting: Internal Medicine

## 2023-08-26 VITALS — BP 134/87 | HR 98 | Temp 98.3°F | Resp 16 | Wt 168.9 lb

## 2023-08-26 DIAGNOSIS — Z992 Dependence on renal dialysis: Secondary | ICD-10-CM | POA: Insufficient documentation

## 2023-08-26 DIAGNOSIS — Z794 Long term (current) use of insulin: Secondary | ICD-10-CM | POA: Diagnosis not present

## 2023-08-26 DIAGNOSIS — Z793 Long term (current) use of hormonal contraceptives: Secondary | ICD-10-CM | POA: Insufficient documentation

## 2023-08-26 DIAGNOSIS — E1122 Type 2 diabetes mellitus with diabetic chronic kidney disease: Secondary | ICD-10-CM | POA: Insufficient documentation

## 2023-08-26 DIAGNOSIS — I120 Hypertensive chronic kidney disease with stage 5 chronic kidney disease or end stage renal disease: Secondary | ICD-10-CM | POA: Insufficient documentation

## 2023-08-26 DIAGNOSIS — D5939 Other hemolytic-uremic syndrome: Secondary | ICD-10-CM | POA: Diagnosis not present

## 2023-08-26 DIAGNOSIS — N186 End stage renal disease: Secondary | ICD-10-CM | POA: Diagnosis not present
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
## 2023-08-26 NOTE — Progress Notes (Signed)
 Patient here for follow up. No new concerns at this time

## 2023-08-26 NOTE — Assessment & Plan Note (Addendum)
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  Stable [see below]  Platelets/LDH are normal.  Continue Ultimoris infusions every 12 weeks [[facility]]; continue indefinitely at this time.  I reviewed with the patient the importance of indefinite therapy.  # Anemia- multifactorial- 11.1. [do not suspect secondary to atypical HUS]; likely secondary to ESRD  on procrit /Iron ; JAN 2022-iron  saturation 14%; ferritin 900+.  Today-awaiting iron  studies/ferritin; haptoglobin today.  # ESRD- on PD-  stable [awaiting transplant list- [facility]].   # Hypertension-142/90- stable; [amlodipine ; on metoprolol ]- stable.   # Poorly controlled DM-199; STABLE; on insulin  pump;[Dr.Kerr].   # vaccine: s/p meningococcal [[facility]]-reminded the patient to speak to [facility] hematology regarding the type of meningococcal vaccination dates for consideration of the booster vaccination.   Infusion 4/21 # DISPOSITION: # infusion in [place]- on July 22nd- scheduled # follow up in second week of JULY 2025- MD: labs- cbc/cmp; LDH; haptoglobin-Dr.B

## 2023-09-03 DIAGNOSIS — E1122 Type 2 diabetes mellitus with diabetic chronic kidney disease: Secondary | ICD-10-CM | POA: Insufficient documentation

## 2023-09-23 ENCOUNTER — Ambulatory Visit: Payer: 59 | Admitting: Dermatology

## 2023-10-15 ENCOUNTER — Ambulatory Visit
Admission: RE | Admit: 2023-10-15 | Discharge: 2023-10-15 | Disposition: A | Discharge status: Home / Self Care | Source: Ambulatory Visit | Attending: Nephrology | Admitting: Nephrology

## 2023-10-15 ENCOUNTER — Other Ambulatory Visit: Payer: Self-pay | Admitting: Nephrology

## 2023-10-15 DIAGNOSIS — T85611A Breakdown (mechanical) of intraperitoneal dialysis catheter, initial encounter: Secondary | ICD-10-CM
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
## 2023-10-18 ENCOUNTER — Encounter: Payer: Self-pay | Admitting: Physician Assistant

## 2023-10-18 ENCOUNTER — Ambulatory Visit: Attending: Surgery | Admitting: Physician Assistant

## 2023-10-18 VITALS — BP 177/98 | HR 101 | Temp 98.7°F | Ht 67.0 in | Wt 182.9 lb

## 2023-10-18 DIAGNOSIS — N186 End stage renal disease: Secondary | ICD-10-CM

## 2023-10-19 ENCOUNTER — Encounter (HOSPITAL_COMMUNITY): Payer: Self-pay | Admitting: Internal Medicine

## 2023-10-19 ENCOUNTER — Other Ambulatory Visit: Payer: Self-pay

## 2023-10-19 DIAGNOSIS — N186 End stage renal disease: Secondary | ICD-10-CM

## 2023-10-20 ENCOUNTER — Other Ambulatory Visit: Payer: Self-pay

## 2023-10-20 ENCOUNTER — Encounter (HOSPITAL_COMMUNITY): Payer: Self-pay | Admitting: Vascular Surgery

## 2023-10-20 NOTE — Anesthesia Preprocedure Evaluation (Signed)
 Anesthesia Evaluation  Patient identified by MRN, date of birth, ID band Patient awake    Reviewed: Allergy & Precautions, H&P , NPO status , Patient's Chart, lab work & pertinent test results  History of Anesthesia Complications (+) PONV and history of anesthetic complications  Airway Mallampati: II   Neck ROM: full    Dental   Pulmonary neg pulmonary ROS   breath sounds clear to auscultation       Cardiovascular hypertension,  Rhythm:regular Rate:Normal     Neuro/Psych  Headaches    GI/Hepatic   Endo/Other  diabetes, Type 2    Renal/GU ESRFRenal disease     Musculoskeletal   Abdominal   Peds  Hematology   Anesthesia Other Findings   Reproductive/Obstetrics                             Anesthesia Physical Anesthesia Plan  ASA: 3  Anesthesia Plan: General   Post-op Pain Management:    Induction: Intravenous  PONV Risk Score and Plan: 4 or greater and Ondansetron , Dexamethasone , Midazolam  and Treatment may vary due to age or medical condition  Airway Management Planned: Oral ETT  Additional Equipment:   Intra-op Plan:   Post-operative Plan: Extubation in OR  Informed Consent: I have reviewed the patients History and Physical, chart, labs and discussed the procedure including the risks, benefits and alternatives for the proposed anesthesia with the patient or authorized representative who has indicated his/her understanding and acceptance.     Dental advisory given  Plan Discussed with: CRNA, Anesthesiologist and Surgeon  Anesthesia Plan Comments: (PAT note by Lynwood Hope, PA-C:  38 year old female with pertinent history including PONV, ESRD on peritoneal dialysis, HTN, type 1 diabetes (A1c 6.1 on 06/11/2023), history of atypical hemolytic uremic syndrome maintained on U;timoris infusions indefinitely through [facility].  Patient has had recent cardiac testing as part of  transplant evaluation.  Echo 05/2023 showed normal biventricular function, mild tricuspid regurgitation.  Nuclear stress test 10/2022 showed normal perfusion.  She will need day of surgery labs and evaluation.  EKG 03/22/2023 (Care Everywhere): Sinus rhythm.  Rate 77.  TTE 05/31/2023 (Care Everywhere): SUMMARY  The left ventricular size is normal with mild concentric hypertrophy.  Left ventricular systolic function is normal.  The right ventricle is normal in size and function.  There is mild tricuspid regurgitation.  Catheter tip visualized in right atrium with echodensity attached measuring 2.2cm by 1.6 cm with  differential including thrombus vs less likely vegetation. Correlate clinically  No prior for comparison in our system but if prior imaging is available this would be helpful.  No valvular vegetations seen on a good quality study.  There is no pericardial effusion.   Nuclear stress 11/19/2022 (Care Everywhere): FUNCTIONAL RESULTS (calculated via Gated SPECT)  Post Stress Image LV EF: 73 %  Stress EDV: 64 ml EDVI: 34 ml/m TID:  Stress ESV: 18 ml ESVI: 10 ml/m     Wall Motion  Wall Thickening  Anterior: Normal Normal  Apex: Normal Normal  Inferior: Normal Normal  Lateral: Normal Normal  Septal: Normal Normal   LV Ejection Fraction & Size: Normal left ventricular size and ejection fraction.    LV Function & Wall Thickening: Normal left ventricular wall motion and thickening.    FINAL COMMENTS  Myocardial perfusion imaging is normal.    )        Anesthesia Quick Evaluation

## 2023-10-20 NOTE — Progress Notes (Signed)
 Anesthesia Chart Review:  38 year old female with pertinent history including PONV, ESRD on peritoneal dialysis, HTN, type 1 diabetes (A1c 6.1 on 06/11/2023), history of atypical hemolytic uremic syndrome maintained on U;timoris infusions indefinitely through [facility].  Patient has had recent cardiac testing as part of transplant evaluation.  Echo 05/2023 showed normal biventricular function, mild tricuspid regurgitation.  Nuclear stress test 10/2022 showed normal perfusion.  She will need day of surgery labs and evaluation.  EKG 03/22/2023 (Care Everywhere): Sinus rhythm.  Rate 77.  TTE 05/31/2023 (Care Everywhere): SUMMARY  The left ventricular size is normal with mild concentric hypertrophy.  Left ventricular systolic function is normal.  The right ventricle is normal in size and function.  There is mild tricuspid regurgitation.  Catheter tip visualized in right atrium with echodensity attached measuring 2.2cm by 1.6 cm with  differential including thrombus vs less likely vegetation. Correlate clinically  No prior for comparison in our system but if prior imaging is available this would be helpful.  No valvular vegetations seen on a good quality study.  There is no pericardial effusion.   Nuclear stress 11/19/2022 (Care Everywhere): FUNCTIONAL RESULTS (calculated via Gated SPECT)   Post Stress Image LV EF: 73 %   Stress EDV: 64 ml EDVI: 34 ml/m TID:   Stress ESV: 18 ml ESVI: 10 ml/m         Wall Motion   Wall Thickening   Anterior: Normal Normal   Apex: Normal Normal   Inferior: Normal Normal   Lateral: Normal Normal   Septal: Normal Normal    LV Ejection Fraction  & Size: Normal left ventricular size and ejection fraction.     LV Function & Wall Thickening: Normal left ventricular wall motion and thickening.     FINAL COMMENTS   Myocardial perfusion imaging is normal.     Lynwood Geofm RIGGERS [facility] Short Stay Center/Anesthesiology Phone [phone] 10/20/2023 10:47 AM

## 2023-10-20 NOTE — Progress Notes (Signed)
 PCP - Dr Gearline per pt Cardiologist - none [facility] - Meta Gerlene Mean, NP Nephrology - Dr Gearline per pt  Chest x-ray - 03/22/23 CE EKG - 03/22/23 CE - Requested tracing Stress Test - 11/19/22 CE ECHO - 05/31/23 CE Cardiac Cath - n/a  ICD Pacemaker/Loop - n/a  Sleep Study -  n/a CPAP - none  Diabetes Type 1, Dexcom G6, Diabetes Coordinator Jeannine was informed of procedure /Dexcom G 6.  Fasting Blood Sugar - 100s Omnipod located on right arm Humalog  Insulin  Pump in place.  Reduce basal rate by 20 % at midnight tonight.    Blood Thinner Instructions:  n/a  Aspirin  Instructions: Follow your surgeon's instructions on when to stop aspirin  prior to surgery,  If no instructions were given by your surgeon then you will need to call the office for those instructions.  NPO  Anesthesia review: Yes  STOP now taking any Aspirin  (unless otherwise instructed by your surgeon), Aleve, Naproxen, Ibuprofen, Motrin, Advil, Goody's, BC's, all herbal medications, fish oil, and all vitamins.   Coronavirus Screening Do you have any of the following symptoms:  Cough yes/no: No Fever (>100.85F)  yes/no: No Runny nose yes/no: No Sore throat yes/no: No Difficulty breathing/shortness of breath  yes/no: No  Have you traveled in the last 14 days and where? yes/no: No  Patient verbalized understanding of instructions that were given via phone.

## 2023-10-21 ENCOUNTER — Encounter (HOSPITAL_COMMUNITY): Payer: Self-pay | Admitting: Vascular Surgery

## 2023-10-21 ENCOUNTER — Other Ambulatory Visit (HOSPITAL_COMMUNITY): Payer: Self-pay

## 2023-10-21 ENCOUNTER — Other Ambulatory Visit: Payer: Self-pay

## 2023-10-21 ENCOUNTER — Encounter (HOSPITAL_COMMUNITY)
Admission: RE | Disposition: A | Discharge status: Home / Self Care | Payer: Self-pay | Source: Home / Self Care | Attending: Vascular Surgery

## 2023-10-21 ENCOUNTER — Ambulatory Visit (HOSPITAL_COMMUNITY)
Admission: RE | Admit: 2023-10-21 | Discharge: 2023-10-21 | Disposition: A | Discharge status: Home / Self Care | Attending: Vascular Surgery | Admitting: Vascular Surgery

## 2023-10-21 ENCOUNTER — Ambulatory Visit (HOSPITAL_COMMUNITY): Admitting: Physician Assistant

## 2023-10-21 ENCOUNTER — Ambulatory Visit (HOSPITAL_BASED_OUTPATIENT_CLINIC_OR_DEPARTMENT_OTHER): Admitting: Physician Assistant

## 2023-10-21 DIAGNOSIS — Z7682 Awaiting organ transplant status: Secondary | ICD-10-CM | POA: Insufficient documentation

## 2023-10-21 DIAGNOSIS — T85691A Other mechanical complication of intraperitoneal dialysis catheter, initial encounter: Secondary | ICD-10-CM | POA: Diagnosis not present

## 2023-10-21 DIAGNOSIS — I120 Hypertensive chronic kidney disease with stage 5 chronic kidney disease or end stage renal disease: Secondary | ICD-10-CM | POA: Insufficient documentation

## 2023-10-21 DIAGNOSIS — E1122 Type 2 diabetes mellitus with diabetic chronic kidney disease: Secondary | ICD-10-CM | POA: Diagnosis not present

## 2023-10-21 DIAGNOSIS — T85611A Breakdown (mechanical) of intraperitoneal dialysis catheter, initial encounter: Secondary | ICD-10-CM | POA: Insufficient documentation

## 2023-10-21 DIAGNOSIS — N186 End stage renal disease: Secondary | ICD-10-CM

## 2023-10-21 DIAGNOSIS — K660 Peritoneal adhesions (postprocedural) (postinfection): Secondary | ICD-10-CM

## 2023-10-21 DIAGNOSIS — E1022 Type 1 diabetes mellitus with diabetic chronic kidney disease: Secondary | ICD-10-CM | POA: Diagnosis not present

## 2023-10-21 DIAGNOSIS — Y718 Miscellaneous cardiovascular devices associated with adverse incidents, not elsewhere classified: Secondary | ICD-10-CM | POA: Insufficient documentation

## 2023-10-21 DIAGNOSIS — Z992 Dependence on renal dialysis: Secondary | ICD-10-CM

## 2023-10-21 DIAGNOSIS — Z794 Long term (current) use of insulin: Secondary | ICD-10-CM | POA: Insufficient documentation

## 2023-10-21 HISTORY — PX: LAPAROSCOPIC LYSIS OF ADHESIONS: SHX5905

## 2023-10-21 HISTORY — PX: LAPAROSCOPY: SHX197

## 2023-10-21 HISTORY — DX: Personal history of other medical treatment: Z92.89

## 2023-10-21 LAB — GLUCOSE, CAPILLARY
Glucose-Capillary: 113 mg/dL — ABNORMAL HIGH (ref 70–99)
Glucose-Capillary: 96 mg/dL (ref 70–99)
Glucose-Capillary: 101 mg/dL — ABNORMAL HIGH (ref 70–99)

## 2023-10-21 LAB — POCT I-STAT, CHEM 8
BUN: 43 mg/dL — ABNORMAL HIGH (ref 6–20)
Calcium, Ion: 0.86 mmol/L — CL (ref 1.15–1.40)
Chloride: 97 mmol/L — ABNORMAL LOW (ref 98–111)
Creatinine, Ser: 13.4 mg/dL — ABNORMAL HIGH (ref 0.44–1.00)
Glucose, Bld: 103 mg/dL — ABNORMAL HIGH (ref 70–99)
HCT: 30 % — ABNORMAL LOW (ref 36.0–46.0)
Hemoglobin: 10.2 g/dL — ABNORMAL LOW (ref 12.0–15.0)
Potassium: 4.7 mmol/L (ref 3.5–5.1)
Sodium: 134 mmol/L — ABNORMAL LOW (ref 135–145)
TCO2: 26 mmol/L (ref 22–32)

## 2023-10-21 LAB — POCT PREGNANCY, URINE: Preg Test, Ur: NEGATIVE

## 2023-10-21 SURGERY — LAPAROSCOPY, DIAGNOSTIC
Anesthesia: General | Site: Abdomen

## 2023-10-21 MED ORDER — MIDAZOLAM HCL 2 MG/2ML IJ SOLN
INTRAMUSCULAR | Status: DC | PRN
  Administered 2023-10-21: 2 mg via INTRAVENOUS

## 2023-10-21 MED ORDER — LIDOCAINE 2% (20 MG/ML) 5 ML SYRINGE
INTRAMUSCULAR | Status: AC
  Filled 2023-10-21: qty 5

## 2023-10-21 MED ORDER — SODIUM CHLORIDE 0.9% FLUSH: 3.0000 mL | INTRAVENOUS | Status: DC | PRN

## 2023-10-21 MED ORDER — OXYCODONE HCL 5 MG/5ML PO SOLN: 5.0000 mg | Freq: Once | ORAL | Status: AC | PRN

## 2023-10-21 MED ORDER — SODIUM CHLORIDE 0.9 % IV SOLN: INTRAVENOUS | Status: DC

## 2023-10-21 MED ORDER — SUGAMMADEX SODIUM 200 MG/2ML IV SOLN
INTRAVENOUS | Status: DC | PRN
  Administered 2023-10-21: 200 mg via INTRAVENOUS

## 2023-10-21 MED ORDER — OXYCODONE HCL 5 MG PO TABS
5.0000 mg | ORAL_TABLET | Freq: Once | ORAL | Status: AC | PRN
  Administered 2023-10-21: 5 mg via ORAL

## 2023-10-21 MED ORDER — 0.9 % SODIUM CHLORIDE (POUR BTL) OPTIME
TOPICAL | Status: DC | PRN
Start: 2023-10-21 — End: 2023-10-21
  Administered 2023-10-21: 1000 mL

## 2023-10-21 MED ORDER — OXYCODONE HCL 5 MG PO TABS
ORAL_TABLET | ORAL | Status: AC
  Filled 2023-10-21: qty 1

## 2023-10-21 MED ORDER — CHLORHEXIDINE GLUCONATE 0.12 % MT SOLN
15.0000 mL | Freq: Once | OROMUCOSAL | Status: AC
  Administered 2023-10-21: 15 mL via OROMUCOSAL
  Filled 2023-10-21: qty 15

## 2023-10-21 MED ORDER — ORAL CARE MOUTH RINSE: 15.0000 mL | Freq: Once | OROMUCOSAL | Status: AC

## 2023-10-21 MED ORDER — ONDANSETRON HCL 4 MG/2ML IJ SOLN
INTRAMUSCULAR | Status: DC | PRN
  Administered 2023-10-21: 4 mg via INTRAVENOUS

## 2023-10-21 MED ORDER — ROCURONIUM BROMIDE 10 MG/ML (PF) SYRINGE
PREFILLED_SYRINGE | INTRAVENOUS | Status: AC
  Filled 2023-10-21: qty 10

## 2023-10-21 MED ORDER — PROPOFOL 10 MG/ML IV BOLUS
INTRAVENOUS | Status: AC
  Filled 2023-10-21: qty 20

## 2023-10-21 MED ORDER — PROPOFOL 10 MG/ML IV BOLUS
INTRAVENOUS | Status: DC | PRN
  Administered 2023-10-21: 150 mg via INTRAVENOUS

## 2023-10-21 MED ORDER — FENTANYL CITRATE (PF) 100 MCG/2ML IJ SOLN: 25.0000 ug | INTRAMUSCULAR | Status: DC | PRN

## 2023-10-21 MED ORDER — MIDAZOLAM HCL 2 MG/2ML IJ SOLN
INTRAMUSCULAR | Status: AC
  Filled 2023-10-21: qty 2

## 2023-10-21 MED ORDER — FENTANYL CITRATE (PF) 250 MCG/5ML IJ SOLN
INTRAMUSCULAR | Status: AC
  Filled 2023-10-21: qty 5

## 2023-10-21 MED ORDER — CHLORHEXIDINE GLUCONATE 4 % EX SOLN: 60.0000 mL | Freq: Once | CUTANEOUS | Status: DC

## 2023-10-21 MED ORDER — DEXAMETHASONE SODIUM PHOSPHATE 10 MG/ML IJ SOLN
INTRAMUSCULAR | Status: DC | PRN
  Administered 2023-10-21: 5 mg via INTRAVENOUS

## 2023-10-21 MED ORDER — ROCURONIUM BROMIDE 10 MG/ML (PF) SYRINGE
PREFILLED_SYRINGE | INTRAVENOUS | Status: DC | PRN
  Administered 2023-10-21: 50 mg via INTRAVENOUS
  Administered 2023-10-21: 30 mg via INTRAVENOUS

## 2023-10-21 MED ORDER — CEFAZOLIN SODIUM-DEXTROSE 2-4 GM/100ML-% IV SOLN
2.0000 g | INTRAVENOUS | Status: AC
  Administered 2023-10-21: 2 g via INTRAVENOUS
  Filled 2023-10-21: qty 100

## 2023-10-21 MED ORDER — FENTANYL CITRATE (PF) 250 MCG/5ML IJ SOLN
INTRAMUSCULAR | Status: DC | PRN
  Administered 2023-10-21: 50 ug via INTRAVENOUS
  Administered 2023-10-21: 100 ug via INTRAVENOUS

## 2023-10-21 MED ORDER — SCOPOLAMINE 1 MG/3DAYS TD PT72
MEDICATED_PATCH | TRANSDERMAL | Status: AC
  Filled 2023-10-21: qty 1

## 2023-10-21 MED ORDER — ONDANSETRON HCL 4 MG/2ML IJ SOLN: 4.0000 mg | Freq: Four times a day (QID) | INTRAMUSCULAR | Status: DC | PRN

## 2023-10-21 MED ORDER — OXYCODONE-ACETAMINOPHEN 5-325 MG PO TABS
1.0000 | ORAL_TABLET | Freq: Four times a day (QID) | ORAL | 0 refills | Status: DC | PRN
  Filled 2023-10-21: qty 12, 3d supply, fill #0

## 2023-10-21 MED ORDER — SCOPOLAMINE 1 MG/3DAYS TD PT72
1.0000 | MEDICATED_PATCH | TRANSDERMAL | Status: DC
  Administered 2023-10-21: 1.5 mg via TRANSDERMAL

## 2023-10-21 MED ORDER — LIDOCAINE 2% (20 MG/ML) 5 ML SYRINGE
INTRAMUSCULAR | Status: DC | PRN
  Administered 2023-10-21: 60 mg via INTRAVENOUS

## 2023-10-21 SURGICAL SUPPLY — 21 items
COVER SURGICAL LIGHT HANDLE (MISCELLANEOUS) ×2 IMPLANT
DERMABOND ADVANCED .7 DNX12 (GAUZE/BANDAGES/DRESSINGS) ×1 IMPLANT
GAUZE 4X4 16PLY ~~LOC~~+RFID DBL (SPONGE) ×2 IMPLANT
GAUZE SPONGE 4X4 12PLY STRL (GAUZE/BANDAGES/DRESSINGS) ×2 IMPLANT
GLOVE BIO SURGEON STRL SZ8 (GLOVE) ×2 IMPLANT
GOWN STRL REUS W/ TWL LRG LVL3 (GOWN DISPOSABLE) ×2 IMPLANT
GOWN STRL REUS W/ TWL XL LVL3 (GOWN DISPOSABLE) ×2 IMPLANT
KIT BASIN OR (CUSTOM PROCEDURE TRAY) ×2 IMPLANT
KIT TURNOVER KIT B (KITS) ×2 IMPLANT
NDL INSUFFLATION 14GA 120MM (NEEDLE) IMPLANT
NEEDLE INSUFFLATION 14GA 120MM (NEEDLE) ×2 IMPLANT
NS IRRIG 1000ML POUR BTL (IV SOLUTION) ×2 IMPLANT
PAD ARMBOARD POSITIONER FOAM (MISCELLANEOUS) ×4 IMPLANT
SET CYSTO W/LG BORE CLAMP LF (SET/KITS/TRAYS/PACK) ×1 IMPLANT
SET EXT 12IN DIALYSIS STAY-SAF (MISCELLANEOUS) ×1 IMPLANT
SUT MNCRL AB 4-0 PS2 18 (SUTURE) ×2 IMPLANT
TOWEL GREEN STERILE (TOWEL DISPOSABLE) ×2 IMPLANT
TOWEL GREEN STERILE FF (TOWEL DISPOSABLE) ×4 IMPLANT
TROCAR ADV FIXATION 5X100MM (TROCAR) ×2 IMPLANT
TUBING INSUFFLATION (TUBING) ×1 IMPLANT
WATER STERILE IRR 1000ML POUR (IV SOLUTION) ×2 IMPLANT

## 2023-10-21 NOTE — Progress Notes (Addendum)
 Patient has a DexCom on her right arm and an Omni pod on her right thigh. Dr. Maryclare made aware that the patient's CBG was 113 upon arrival to Short Stay and that the patient has paused her pump. Ok per Dr. Pearlene. Per Dr. Maryclare it is ok to follow the hypoglycemic protocol if needed.

## 2023-10-21 NOTE — Interval H&P Note (Signed)
 History and Physical Interval Note:  10/21/2023 1:16 PM  Kelly Huff  has presented today for surgery, with the diagnosis of ESRD.  The various methods of treatment have been discussed with the patient and family. After consideration of risks, benefits and other options for treatment, the patient has consented to  Procedure(s): LAPAROSCOPY, DIAGNOSTIC (N/A) REMOVAL, DIALYSIS CATHETER (N/A) INSERTION OF DIALYSIS CATHETER (N/A) as a surgical intervention.  The patient's history has been reviewed, patient examined, no change in status, stable for surgery.  I have reviewed the patient's chart and labs.  Questions were answered to the patient's satisfaction.     [name]

## 2023-10-21 NOTE — Anesthesia Procedure Notes (Addendum)
 Procedure Name: Intubation Date/Time: 10/21/2023 1:39 PM  Performed by: Marva Lonni PARAS, CRNAPre-anesthesia Checklist: Patient identified, Emergency Drugs available, Suction available and Patient being monitored Patient Re-evaluated:Patient Re-evaluated prior to induction Oxygen Delivery Method: Circle System Utilized Preoxygenation: Pre-oxygenation with 100% oxygen Induction Type: IV induction Ventilation: Mask ventilation without difficulty Laryngoscope Size: Mac and 3 Grade View: Grade I Tube type: Oral Tube size: 7.0 mm Number of attempts: 1 Airway Equipment and Method: Stylet Placement Confirmation: ETT inserted through vocal cords under direct vision, positive ETCO2 and breath sounds checked- equal and bilateral Secured at: 21 cm Tube secured with: Tape Dental Injury: Teeth and Oropharynx as per pre-operative assessment

## 2023-10-21 NOTE — Op Note (Signed)
 DATE OF SERVICE: 10/21/2023  PATIENT:  Kelly Huff  38 y.o. female  PRE-OPERATIVE DIAGNOSIS:  ESRD, malfunctioning PD catheter  POST-OPERATIVE DIAGNOSIS:  Same, intraperitoneal adhesions  PROCEDURE:   diagnostic laparoscopy with lysis of adhesions and repositioning of PD catheter (CPT [phone])  SURGEON:  Surgeons and Role:    * Magda Debby SAILOR, MD - Primary  ASSISTANT: Lucie Apt, PA-C  An experienced assistant was required given the complexity of this procedure and the standard of surgical care. My assistant helped with exposure through counter tension, suctioning, ligation and retraction to better visualize the surgical field.  My assistant expedited sewing during the case by following my sutures. Wherever I use the term we in the report, my assistant actively helped me with that portion of the procedure.  ANESTHESIA:   general  EBL: minimal  BLOOD ADMINISTERED:none  DRAINS: none   LOCAL MEDICATIONS USED:  NONE  SPECIMEN:  none  COUNTS: confirmed correct.  TOURNIQUET:  none   PATIENT DISPOSITION:  PACU - hemodynamically stable.   Delay start of Pharmacological VTE agent (>24hrs) due to surgical blood loss or risk of bleeding: no  INDICATION FOR PROCEDURE: [name] is a 38 y.o. female with ESRD dialyzing via PD catheter. The catheter is not working well. Preoperative radiograph shows no technical problems with the catheter. I advised the patient that she may no longer be able to do PD, and that revision would not guarantee extended use of PD. After careful discussion of risks, benefits, and alternatives the patient was offered diagnostic laparoscopy. The patient understood and wished to proceed.  OPERATIVE FINDINGS:  Filmy adhesions through the abdomen. The pelvis and small bowel were adhered together. The catheter was adhered to the side wall. Lysis of adhesions was able to free the catheter. The catheter flushed and drained appropriately  DESCRIPTION OF  PROCEDURE: After identification of the patient in the pre-operative holding area, the patient was transferred to the operating room. The patient was positioned supine on the operating room table. Anesthesia was induced. The abdomen was prepped and draped in standard fashion. A surgical pause was performed confirming correct patient, procedure, and operative location.  A Veress needle was introduced into the abdomen at Palmer's point, immediately below the left costal margin.  A saline drop test was used to confirm intra-abdominal position.  The Veress needle was connected to insufflation tubing and insufflation initiated.  A low opening pressure and good flow rate were noted.  A 5 mm trocar was introduced into the left upper quadrant using Visi-View technique.  The obturator was removed and the abdomen inspected with a 30 degree angled laparoscope.  No evidence of Veress needle injury was noted.  An additional 5 mm trocar was inserted a handsbreadth away to facilitate the case.  The abdomen was inspected. Adhesions were seen throughout. The catheter was seen adhered to the bladder / pelvis. The catheter was freed with lysis of adhesions.   The catheter was connected to cystoscopy tubing.  The catheter flushed and drained without any difficulty.  The trochars were removed.  All incisions were closed with interrupted 4-0 Monocryl sutures.  Dermabond was applied.  A sterile bandage was applied to the peritoneal dialysis catheter.   Upon completion of the case instrument and sharps counts were confirmed correct. The patient was transferred to the PACU in good condition. I was present for all portions of the procedure.  FOLLOW UP PLAN: OK to use catheter tomorrow. No further intervention for PD will  be offered, as I suspect it will not work. If catheter is not functional, a tunneled dialysis catheter and permanent dialysis access should be placed.   Debby SAILOR. Magda, MD [facility]  [facility] Phone Number: [phone] 10/21/2023 9:43 PM

## 2023-10-21 NOTE — Transfer of Care (Signed)
 Immediate Anesthesia Transfer of Care Note  Patient: Kelly Huff  Procedure(s) Performed: LAPAROSCOPY, DIAGNOSTIC (Abdomen) LYSIS, ADHESIONS, LAPAROSCOPIC (Abdomen)  Patient Location: PACU  Anesthesia Type:General  Level of Consciousness: drowsy and patient cooperative  Airway & Oxygen Therapy: Patient Spontanous Breathing  Post-op Assessment: Report given to RN and Post -op Vital signs reviewed and stable  Post vital signs: Reviewed and stable  Last Vitals:  Vitals Value Taken Time  BP 141/83 10/21/23 14:45  Temp    Pulse 77 10/21/23 14:47  Resp 10 10/21/23 14:46  SpO2 93 % 10/21/23 14:47  Vitals shown include unfiled device data.  Last Pain:  Vitals:   10/21/23 0931  TempSrc:   PainSc: 7       Patients Stated Pain Goal: 2 (10/21/23 0931)  Complications: No notable events documented.

## 2023-10-22 ENCOUNTER — Encounter (HOSPITAL_COMMUNITY): Payer: Self-pay | Admitting: Vascular Surgery

## 2023-10-22 NOTE — Anesthesia Postprocedure Evaluation (Signed)
 Anesthesia Post Note  Patient: Kelly Huff  Procedure(s) Performed: LAPAROSCOPY, DIAGNOSTIC (Abdomen) LYSIS, ADHESIONS, LAPAROSCOPIC (Abdomen)     Patient location during evaluation: PACU Anesthesia Type: General Level of consciousness: awake and alert Pain management: pain level controlled Vital Signs Assessment: post-procedure vital signs reviewed and stable Respiratory status: spontaneous breathing, nonlabored ventilation, respiratory function stable and patient connected to nasal cannula oxygen Cardiovascular status: blood pressure returned to baseline and stable Postop Assessment: no apparent nausea or vomiting Anesthetic complications: no   No notable events documented.  Last Vitals:  Vitals:   10/21/23 1515 10/21/23 1530  BP: 136/89 (!) 143/88  Pulse: 80 82  Resp: 13 15  Temp:  36.7 C  SpO2: 99% 99%    Last Pain:  Vitals:   10/21/23 1445  TempSrc:   PainSc: 0-No pain                 Lisset Ketchem S

## 2023-10-25 ENCOUNTER — Emergency Department (HOSPITAL_COMMUNITY)

## 2023-10-25 ENCOUNTER — Other Ambulatory Visit: Payer: Self-pay

## 2023-10-25 ENCOUNTER — Emergency Department (HOSPITAL_COMMUNITY): Admission: EM | Admit: 2023-10-25 | Discharge: 2023-10-25 | Disposition: A | Discharge status: Home / Self Care

## 2023-10-25 ENCOUNTER — Encounter (HOSPITAL_COMMUNITY): Payer: Self-pay

## 2023-10-25 DIAGNOSIS — E1022 Type 1 diabetes mellitus with diabetic chronic kidney disease: Secondary | ICD-10-CM | POA: Insufficient documentation

## 2023-10-25 DIAGNOSIS — W228XXA Striking against or struck by other objects, initial encounter: Secondary | ICD-10-CM | POA: Insufficient documentation

## 2023-10-25 DIAGNOSIS — Z992 Dependence on renal dialysis: Secondary | ICD-10-CM | POA: Insufficient documentation

## 2023-10-25 DIAGNOSIS — N186 End stage renal disease: Secondary | ICD-10-CM | POA: Diagnosis not present

## 2023-10-25 DIAGNOSIS — Z7982 Long term (current) use of aspirin: Secondary | ICD-10-CM | POA: Diagnosis not present

## 2023-10-25 DIAGNOSIS — Z79899 Other long term (current) drug therapy: Secondary | ICD-10-CM | POA: Insufficient documentation

## 2023-10-25 DIAGNOSIS — Z794 Long term (current) use of insulin: Secondary | ICD-10-CM | POA: Diagnosis not present

## 2023-10-25 DIAGNOSIS — T310 Burns involving less than 10% of body surface: Secondary | ICD-10-CM | POA: Insufficient documentation

## 2023-10-25 DIAGNOSIS — X19XXXA Contact with other heat and hot substances, initial encounter: Secondary | ICD-10-CM | POA: Diagnosis not present

## 2023-10-25 DIAGNOSIS — Z91010 Allergy to peanuts: Secondary | ICD-10-CM | POA: Diagnosis not present

## 2023-10-25 DIAGNOSIS — E1065 Type 1 diabetes mellitus with hyperglycemia: Secondary | ICD-10-CM | POA: Insufficient documentation

## 2023-10-25 DIAGNOSIS — T2022XA Burn of second degree of lip(s), initial encounter: Secondary | ICD-10-CM | POA: Diagnosis not present

## 2023-10-25 DIAGNOSIS — I120 Hypertensive chronic kidney disease with stage 5 chronic kidney disease or end stage renal disease: Secondary | ICD-10-CM | POA: Diagnosis not present

## 2023-10-25 DIAGNOSIS — I10 Essential (primary) hypertension: Secondary | ICD-10-CM | POA: Insufficient documentation

## 2023-10-25 DIAGNOSIS — E109 Type 1 diabetes mellitus without complications: Secondary | ICD-10-CM | POA: Insufficient documentation

## 2023-10-25 DIAGNOSIS — S9031XA Contusion of right foot, initial encounter: Secondary | ICD-10-CM | POA: Diagnosis not present

## 2023-10-25 DIAGNOSIS — S99921A Unspecified injury of right foot, initial encounter: Secondary | ICD-10-CM | POA: Diagnosis present

## 2023-10-25 DIAGNOSIS — H269 Unspecified cataract: Secondary | ICD-10-CM | POA: Insufficient documentation

## 2023-10-25 DIAGNOSIS — E2740 Unspecified adrenocortical insufficiency: Secondary | ICD-10-CM | POA: Insufficient documentation

## 2023-10-25 DIAGNOSIS — E1036 Type 1 diabetes mellitus with diabetic cataract: Secondary | ICD-10-CM | POA: Insufficient documentation

## 2023-10-25 MED ORDER — MUPIROCIN 2 % EX OINT: 1.0000 | TOPICAL_OINTMENT | Freq: Two times a day (BID) | CUTANEOUS | 0 refills | Status: DC

## 2023-10-25 NOTE — ED Triage Notes (Signed)
 POV/ upper lip swelling x2 days/ unknown source/ right toe swelling(2nd, 3rd, 4th, 5th toes) after hitting on toy/ pt is A&Ox4/ ambulatory/ no distress at this time/ pt denies taking benadryl / denies reports of rash orhives
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
## 2023-10-25 NOTE — ED Provider Notes (Signed)
 [place] EMERGENCY DEPARTMENT AT [facility] Provider Note   CSN: [id number] Arrival date & time: 10/25/23  1436     Patient presents with: Oral Swelling and Toe Pain   Kelly Huff is a 38 y.o. female.  Who presents the emergency department with chief complaint of lip pain and foot pain..  She has a past medical history of hypertension, type 1 diabetes, end-stage renal disease on peritoneal dialysis.  She did not take her blood pressure occasion today.  Patient reports that she woke up with swelling over her upper lip 2 days ago with a large blister that opened up and as she has had serous fluid draining from it.  She does admit that she had hot chocolate the day before this happened.  Patient also tripped over some children's toys on the floor 2 nights ago and has been limping on her right foot.  She has no numbness or tingling.    Toe Pain       Prior to Admission medications   Medication Sig Start Date End Date Taking? Authorizing Provider  amLODipine  (NORVASC ) 10 MG tablet Take 10 mg by mouth in the morning. 11/06/18   [provider]  aspirin  EC 81 MG tablet Take 81 mg by mouth daily. Swallow whole.    [provider]  AURYXIA 1 GM 210 MG(Fe) tablet Take 630 mg by mouth See admin instructions. Take 630 mg by mouth three times a day with meals and 210 mg with each snack    [provider]  calcitRIOL (ROCALTROL) 0.25 MCG capsule Take 0.25 mcg by mouth daily. 09/30/23   [provider]  Collagen-Vitamin C-Biotin (COLLAGEN PO) Take 1 Scoop by mouth daily. In liquid    [provider]  Continuous Glucose Sensor (DEXCOM G6 SENSOR) MISC Inject 1 Device into the skin See admin instructions. Place 1 new device into the skin every 10 days    [provider]  cyclobenzaprine  (FLEXERIL ) 10 MG tablet Take 10 mg by mouth 3 (three) times daily as needed for muscle spasms.    [provider]  Digestive Enzyme CAPS Take 1  capsule by mouth daily.  prebiotics probiotics    [provider]  Glucagon  3 MG/DOSE POWD Place 3 mg into the nose daily as needed (low glucose). 10/07/23   [provider]  Insulin  Disposable Pump (OMNIPOD 5 G6 PODS, GEN 5,) MISC Inject 1 Device into the skin every 3 (three) days.    [provider]  insulin  lispro (HUMALOG ) 100 UNIT/ML injection Continue use of insulin  pump with setting as previous: Restart in the morning of 03/23/2020 as Lantus  was given on the morning of 03/22/2020. 03/22/20   Swayze, Ava, DO  metoprolol  succinate (TOPROL -XL) 100 MG 24 hr tablet Take 100 mg by mouth daily. 08/10/23   [provider]  oxyCODONE -acetaminophen  (PERCOCET) 5-325 MG tablet Take 1 tablet by mouth every 6 (six) hours as needed. 10/21/23   Rhyne, Samantha J, PA-C  traZODone  (DESYREL ) 50 MG tablet Take 50 mg by mouth at bedtime as needed for sleep.    [provider]    Allergies: Morphine, Peanut oil, Sulfamethoxazole-trimethoprim, Covid-19 (mrna) vaccine, and Trulicity [dulaglutide]    Review of Systems  Updated Vital Signs BP (!) 209/119 (BP Location: Left Arm)   Pulse (!) 108   Temp 98.6 F (37 C) (Oral)   Resp 18   Wt 81.2 kg   LMP 10/04/2023 (Approximate)   SpO2 100%  BMI 28.04 kg/m   Physical Exam Vitals and nursing note reviewed.  Constitutional:      General: She is not in acute distress.    Appearance: She is well-developed. She is not diaphoretic.  HENT:     Head: Normocephalic.     Comments: Denuded area of the Upper lip with swelling, No signs of infection. Appears consistent with 2nd degree burn.    Right Ear: External ear normal.     Left Ear: External ear normal.     Nose: Nose normal.     Mouth/Throat:     Mouth: Mucous membranes are moist.   Eyes:     General: No scleral icterus.    Conjunctiva/sclera: Conjunctivae normal.    Cardiovascular:     Rate and Rhythm: Normal rate and regular rhythm.     Heart sounds:  Normal heart sounds. No murmur heard.    No friction rub. No gallop.  Pulmonary:     Effort: Pulmonary effort is normal. No respiratory distress.     Breath sounds: Normal breath sounds.  Abdominal:     General: Bowel sounds are normal. There is no distension.     Palpations: Abdomen is soft. There is no mass.     Tenderness: There is no abdominal tenderness. There is no guarding.   Musculoskeletal:     Cervical back: Normal range of motion.     Comments: Foot exam shows no bruising or swelling wiggles toes, normal capillary refill, DP and PT pulse 2+   Skin:    General: Skin is warm and dry.   Neurological:     Mental Status: She is alert and oriented to person, place, and time.   Psychiatric:        Behavior: Behavior normal.     (all labs ordered are listed, but only abnormal results are displayed) Labs Reviewed - No data to display  EKG: None  Radiology: DG Foot Complete Right Result Date: 10/25/2023 CLINICAL DATA:  Right second, third, fourth, and fifth toe swelling after hitting on toy. Injury. EXAM: RIGHT FOOT COMPLETE - 3+ VIEW COMPARISON:  None Available. FINDINGS: Tiny plantar calcaneal heel spur. Joint spaces are preserved. No acute fracture or dislocation. Moderate to high-grade atherosclerotic calcifications. IMPRESSION: 1. No acute fracture. 2. Tiny plantar calcaneal heel spur. Electronically Signed   By: Tanda Lyons M.D.   On: 10/25/2023 16:03     Procedures   Medications Ordered in the ED - No data to display                                  Medical Decision Making  Patient with second-degree burn of her upper lip.  No signs of infection.  Will give mupirocin ointment for topical use.  Patient also did not take her blood pressure medication she should take this immediately when she gets home.  She has a contusion of the right foot, I visualized interpreted a right foot x-ray which shows no signs of fracture.  Discussed outpatient follow-up and return  precautions.     Final diagnoses:  None    ED Discharge Orders     None          Arloa Chroman, PA-C 10/25/23 1722    Ula Prentice SAUNDERS, MD 10/25/23 [phone]

## 2023-11-01 NOTE — Patient Instructions (Signed)
 Preventing Sexually Transmitted Infections, Adult Sexually transmitted infections (STIs) are spread from person to person (are contagious). They are spread, or transmitted, during sex. The sex may be vaginal, anal, or oral. STIs can be passed during sexual contact with skin, genitals, mouth, or rectum. They may spread through body fluids, such as saliva, semen, blood, vaginal mucus, and urine. STIs are very common. They can happen in people of all ages. Some common STIs are: Herpes. Hepatitis B. Chlamydia. Gonorrhea. Syphilis. Trichomoniasis. Human papillomavirus (HPV). Human immunodeficiency virus (HIV). This can cause acquired immunodeficiency syndrome (AIDS). How can STIs affect me? You may not have symptoms with an STI. Even if you do not have symptoms, you can still spread the infection to others. You also still need treatment. STIs can be treated. Some STIs can be cured. Other STIs cannot be cured and will affect you for the rest of your life. Certain STIs may: Require you to take medicine for the rest of your life. Affect your ability to have children. Increase your risk for getting other STIs. Increase your risk of getting certain conditions. These may include: Cervical cancer. Pelvic inflammatory disease (PID). Organ damage or damage to other parts of your body. This can happen if the infection spreads. Cause problems during pregnancy. STIs may be spread to the baby during pregnancy or birth. Females tend to have more severe problems from STIs than males. What can increase my risk? You may be more at risk for an STI if: You do not use protection during sex. You have more than one sex partner. You have a sex partner who has other sex partners. You have sex with a person who has an STI. You have an STI, or you have had an STI before. You inject drugs or have a sex partner who injects drugs. What actions can I take to prevent STIs? The only way to fully prevent STIs is not to  have sex of any kind. This is called practicing abstinence. If you are sexually active, you can protect yourself and others by taking these actions to lower your risk of getting an STI: Lifestyle Have only one sex partner or limit the number of sex partners you have. Avoid having sex after you have alcohol or drugs. Alcohol and drugs can affect your ability to make good choices. This can lead to risky sexual behaviors. Go to prevention counseling. This can teach you how to avoid getting an STI. Barrier protection  Use methods to stop body fluids from being exchanged between partners during sex (barrier protection). These methods can be used during oral, vaginal, or anal sex. They include: External condom, for males. Internal condom, for females. Dental dam. Use a new barrier method for every sex act from start to finish. Know that a barrier method may not protect you from all STIs. Some STIs, such as herpes, are spread through skin-to-skin contact. Avoid all sexual contact if you or a partner has herpes and there is an active flare with open sores. Birth control pills, injections, implants, and intrauterine devices (IUDs) do not protect against STIs. To prevent both STIs and pregnancy, always use a condom with a second form of birth control. General information Ask your health care provider about taking pre-exposure prophylaxis (PrEP) to prevent HIV. Stay up to date on your vaccines. Some vaccines can lower your risk of getting certain STIs. These include: Hepatitis B vaccine. HPV vaccine. This is recommended for people up to age 66. Get tested for STIs. Have your  partners get tested, too. If you test positive for an STI, follow recommendations from your health care provider about treatment. Make sure your sex partners are tested and treated as well. Where to find more information Learn more about STIs from: Centers for Disease Control and Prevention (CDC): More information about certain  STIs: TonerPromos.no Places to get sexual health counseling and treatment for free or at a low cost: gettested.TonerPromos.no U.[place]. Department of Health and Human Services [facility]): [employer]Less[employer]ca This information is not intended to replace advice given to you by your health care provider. Make sure you discuss any questions you have with your health care provider. Document Revised: 04/23/2022 Document Reviewed: 09/26/2021 Elsevier Patient Education  2024 [employer].

## 2023-11-02 ENCOUNTER — Other Ambulatory Visit (HOSPITAL_COMMUNITY)
Admission: RE | Admit: 2023-11-02 | Discharge: 2023-11-02 | Disposition: A | Discharge status: Home / Self Care | Source: Ambulatory Visit

## 2023-11-02 ENCOUNTER — Ambulatory Visit (INDEPENDENT_AMBULATORY_CARE_PROVIDER_SITE_OTHER)

## 2023-11-02 ENCOUNTER — Telehealth: Payer: Self-pay | Admitting: *Deleted

## 2023-11-02 ENCOUNTER — Encounter: Payer: Self-pay | Admitting: Internal Medicine

## 2023-11-02 ENCOUNTER — Inpatient Hospital Stay (HOSPITAL_BASED_OUTPATIENT_CLINIC_OR_DEPARTMENT_OTHER): Admitting: Internal Medicine

## 2023-11-02 ENCOUNTER — Inpatient Hospital Stay: Attending: Internal Medicine

## 2023-11-02 VITALS — BP 154/85 | HR 93 | Resp 16 | Ht 67.0 in | Wt 174.5 lb

## 2023-11-02 VITALS — BP 135/75 | HR 85 | Temp 97.1°F | Resp 18 | Wt 173.6 lb

## 2023-11-02 DIAGNOSIS — Z7682 Awaiting organ transplant status: Secondary | ICD-10-CM | POA: Diagnosis not present

## 2023-11-02 DIAGNOSIS — E1022 Type 1 diabetes mellitus with diabetic chronic kidney disease: Secondary | ICD-10-CM | POA: Insufficient documentation

## 2023-11-02 DIAGNOSIS — Z113 Encounter for screening for infections with a predominantly sexual mode of transmission: Secondary | ICD-10-CM | POA: Insufficient documentation

## 2023-11-02 DIAGNOSIS — N760 Acute vaginitis: Secondary | ICD-10-CM | POA: Diagnosis not present

## 2023-11-02 DIAGNOSIS — Z992 Dependence on renal dialysis: Secondary | ICD-10-CM | POA: Diagnosis not present

## 2023-11-02 DIAGNOSIS — Z7982 Long term (current) use of aspirin: Secondary | ICD-10-CM | POA: Insufficient documentation

## 2023-11-02 DIAGNOSIS — N186 End stage renal disease: Secondary | ICD-10-CM | POA: Insufficient documentation

## 2023-11-02 DIAGNOSIS — I120 Hypertensive chronic kidney disease with stage 5 chronic kidney disease or end stage renal disease: Secondary | ICD-10-CM | POA: Insufficient documentation

## 2023-11-02 DIAGNOSIS — Z794 Long term (current) use of insulin: Secondary | ICD-10-CM | POA: Diagnosis not present

## 2023-11-02 DIAGNOSIS — D593 Hemolytic-uremic syndrome, unspecified: Secondary | ICD-10-CM

## 2023-11-02 DIAGNOSIS — D5939 Other hemolytic-uremic syndrome: Secondary | ICD-10-CM | POA: Insufficient documentation

## 2023-11-02 DIAGNOSIS — Z79899 Other long term (current) drug therapy: Secondary | ICD-10-CM | POA: Diagnosis not present

## 2023-11-02 LAB — CBC WITH DIFFERENTIAL (CANCER CENTER ONLY)
Abs Immature Granulocytes: 0.01 K/uL (ref 0.00–0.07)
Basophils Absolute: 0 K/uL (ref 0.0–0.1)
Basophils Relative: 1 %
Eosinophils Absolute: 0.2 K/uL (ref 0.0–0.5)
Eosinophils Relative: 4 %
HCT: 30.6 % — ABNORMAL LOW (ref 36.0–46.0)
Hemoglobin: 9.9 g/dL — ABNORMAL LOW (ref 12.0–15.0)
Immature Granulocytes: 0 %
Lymphocytes Relative: 17 %
Lymphs Abs: 0.9 K/uL (ref 0.7–4.0)
MCH: 27.4 pg (ref 26.0–34.0)
MCHC: 32.4 g/dL (ref 30.0–36.0)
MCV: 84.8 fL (ref 80.0–100.0)
Monocytes Absolute: 0.7 K/uL (ref 0.1–1.0)
Monocytes Relative: 12 %
Neutro Abs: 3.6 K/uL (ref 1.7–7.7)
Neutrophils Relative %: 66 %
Platelet Count: 290 K/uL (ref 150–400)
RBC: 3.61 MIL/uL — ABNORMAL LOW (ref 3.87–5.11)
RDW: 13.1 % (ref 11.5–15.5)
WBC Count: 5.4 K/uL (ref 4.0–10.5)
nRBC: 0 % (ref 0.0–0.2)

## 2023-11-02 LAB — CMP (CANCER CENTER ONLY)
ALT: 5 U/L (ref 0–44)
AST: 20 U/L (ref 15–41)
Albumin: 3.2 g/dL — ABNORMAL LOW (ref 3.5–5.0)
Alkaline Phosphatase: 169 U/L — ABNORMAL HIGH (ref 38–126)
Anion gap: 13 (ref 5–15)
BUN: 40 mg/dL — ABNORMAL HIGH (ref 6–20)
CO2: 27 mmol/L (ref 22–32)
Calcium: 7.4 mg/dL — ABNORMAL LOW (ref 8.9–10.3)
Chloride: 97 mmol/L — ABNORMAL LOW (ref 98–111)
Creatinine: 13.86 mg/dL (ref 0.44–1.00)
GFR, Estimated: 3 mL/min — ABNORMAL LOW (ref >60)
Glucose, Bld: 130 mg/dL — ABNORMAL HIGH (ref 70–99)
Potassium: 3.9 mmol/L (ref 3.5–5.1)
Sodium: 137 mmol/L (ref 135–145)
Total Bilirubin: 0.5 mg/dL (ref 0.0–1.2)
Total Protein: 7.4 g/dL (ref 6.5–8.1)

## 2023-11-02 LAB — LACTATE DEHYDROGENASE: LDH: 292 U/L — ABNORMAL HIGH (ref 98–192)

## 2023-11-02 NOTE — Progress Notes (Signed)
 Patients next Ultomeris infusion is 11/16/23.No side effectss. Has a healing 2nd degree burn on lip from hot cocoa. Energy levels are slowly improving since PD catheter surgery. Appetite is down.  Denies nausea. Taking stool softeners.

## 2023-11-02 NOTE — Progress Notes (Signed)
    NURSE VISIT NOTE  Subjective:    Patient ID: Elizabet E Shan, female    DOB: 12/17/1985, 38 y.o.   MRN: [id number]  HPI  Patient is a 38 y.o. [id number] female who presents for vaginal itching x 1 day, she denies vaginal discharge, and vaginal odor. Denies abnormal vaginal bleeding or significant pelvic pain or fever,denies dysuria, urinary frequency, urinary urgency, and pelvic pain. Patient denies history of known exposure to STD.   Objective:    LMP 10/04/2023 (Approximate)    @THIS  VISIT ONLY@  Assessment:   1. Screen for STD (sexually transmitted disease)     bacterial vaginosis, trichomonas, rule out GC or chlamydia, nonspecific vaginitis, and atrophic vaginitis  Plan:   GC and chlamydia DNA  probe sent to lab. Treatment: abstain from coitus during course of treatment ROV prn if symptoms persist or worsen.    Camelia Fetters, CMA [facility]

## 2023-11-02 NOTE — Telephone Encounter (Signed)
 Critical lab called by Luke Horn; Creatinine 13.86. Readback. MD notified, Dr Rennie.
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
## 2023-11-02 NOTE — Assessment & Plan Note (Addendum)
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  Stable [see below]  Platelets/LDH are normal.  Continue Ultimoris infusions every 12 weeks [[facility]]; continue indefinitely at this time.  I reviewed with the patient the importance of indefinite therapy. Stable.   # Anemia- multifactorial- 9-10 [do not suspect secondary to atypical HUS]; likely secondary to ESRD  on procrit /Iron ;  Today-awaiting iron  studies/ferritin; haptoglobin today.  # ESRD- on PD-  stable [awaiting transplant list- [facility]].   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   Infusion 7/22  # DISPOSITION: # infusion in [place]- on July 22nd as- scheduled # please schedule the infusion in [place] 12 weeks from July 22nd, 2025- # follow up in 12  weeks-- MD: labs- cbc/cmp; LDH; haptoglobin; iron  studies;ferritin--Dr.B

## 2023-11-04 LAB — HAPTOGLOBIN: Haptoglobin: 158 mg/dL (ref 33–278)

## 2023-11-04 LAB — CERVICOVAGINAL ANCILLARY ONLY
Bacterial Vaginitis (gardnerella): POSITIVE — AB
Candida Glabrata: POSITIVE — AB
Candida Vaginitis: POSITIVE — AB
Chlamydia: NEGATIVE
Comment: NEGATIVE
Comment: NEGATIVE
Comment: NEGATIVE
Comment: NEGATIVE
Comment: NEGATIVE
Comment: NORMAL
Neisseria Gonorrhea: NEGATIVE
Trichomonas: NEGATIVE

## 2023-11-05 ENCOUNTER — Ambulatory Visit: Payer: Self-pay

## 2023-11-05 ENCOUNTER — Telehealth: Payer: Self-pay

## 2023-11-05 ENCOUNTER — Other Ambulatory Visit: Payer: Self-pay | Admitting: Licensed Practical Nurse

## 2023-11-05 ENCOUNTER — Other Ambulatory Visit: Payer: Self-pay

## 2023-11-05 DIAGNOSIS — B379 Candidiasis, unspecified: Secondary | ICD-10-CM

## 2023-11-05 DIAGNOSIS — B9689 Other specified bacterial agents as the cause of diseases classified elsewhere: Secondary | ICD-10-CM

## 2023-11-05 MED ORDER — METRONIDAZOLE 500 MG PO TABS: 500.0000 mg | ORAL_TABLET | Freq: Two times a day (BID) | ORAL | 0 refills | Status: DC

## 2023-11-05 MED ORDER — FLUCONAZOLE 150 MG PO TABS: 150.0000 mg | ORAL_TABLET | Freq: Every day | ORAL | 0 refills | Status: DC

## 2023-11-05 NOTE — Telephone Encounter (Signed)
 Can you please advise on the treatment for candida glabrata

## 2023-11-11 ENCOUNTER — Telehealth (HOSPITAL_COMMUNITY): Payer: Self-pay | Admitting: Internal Medicine

## 2023-11-11 NOTE — Telephone Encounter (Signed)
 Patient scheduled for Ultomiris  infusion on 11/16/23. [facility] dispense authorization generated. RDA - [id number]     Norton Blush, PharmD Pharmacist [facility]

## 2023-11-16 ENCOUNTER — Ambulatory Visit (HOSPITAL_COMMUNITY)
Admission: RE | Admit: 2023-11-16 | Discharge: 2023-11-16 | Disposition: A | Discharge status: Home / Self Care | Source: Ambulatory Visit | Attending: Internal Medicine | Admitting: Internal Medicine

## 2023-11-16 DIAGNOSIS — D5939 Other hemolytic-uremic syndrome: Secondary | ICD-10-CM | POA: Diagnosis present

## 2023-11-16 MED ORDER — SODIUM CHLORIDE 0.9 % IV SOLN
3300.0000 mg | INTRAVENOUS | Status: DC
  Administered 2023-11-16: 3300 mg via INTRAVENOUS
  Filled 2023-11-16: qty 33

## 2023-11-19 ENCOUNTER — Institutional Professional Consult (permissible substitution): Payer: Self-pay | Admitting: Plastic Surgery

## 2024-01-07 DIAGNOSIS — D5939 Other hemolytic-uremic syndrome: Secondary | ICD-10-CM | POA: Insufficient documentation

## 2024-01-24 ENCOUNTER — Other Ambulatory Visit: Payer: Self-pay | Admitting: *Deleted

## 2024-01-24 DIAGNOSIS — D593 Hemolytic-uremic syndrome, unspecified: Secondary | ICD-10-CM

## 2024-01-25 ENCOUNTER — Inpatient Hospital Stay: Attending: Internal Medicine

## 2024-01-25 ENCOUNTER — Ambulatory Visit: Admitting: Internal Medicine

## 2024-01-25 ENCOUNTER — Encounter: Payer: Self-pay | Admitting: Internal Medicine

## 2024-01-28 ENCOUNTER — Telehealth: Payer: Self-pay | Admitting: Internal Medicine

## 2024-01-28 NOTE — Telephone Encounter (Signed)
 Patient called stating that she unfortunately missed her appointment on 9/30 and would like to r/s. She stated that she needs to see Dr. Rennie and have her lab work prior to her infusion on 10/22. Infusion is not at our facility and provider does not currently have any availability to accommodate.   Routing message to clinical team for guidance/alternatives.

## 2024-02-01 ENCOUNTER — Inpatient Hospital Stay (HOSPITAL_BASED_OUTPATIENT_CLINIC_OR_DEPARTMENT_OTHER): Admitting: Nurse Practitioner

## 2024-02-01 ENCOUNTER — Inpatient Hospital Stay: Attending: Internal Medicine

## 2024-02-01 ENCOUNTER — Telehealth: Payer: Self-pay | Admitting: *Deleted

## 2024-02-01 ENCOUNTER — Encounter: Payer: Self-pay | Admitting: Nurse Practitioner

## 2024-02-01 VITALS — BP 144/85 | HR 84 | Temp 98.9°F | Resp 16 | Wt 183.0 lb

## 2024-02-01 DIAGNOSIS — N186 End stage renal disease: Secondary | ICD-10-CM | POA: Insufficient documentation

## 2024-02-01 DIAGNOSIS — K659 Peritonitis, unspecified: Secondary | ICD-10-CM | POA: Diagnosis not present

## 2024-02-01 DIAGNOSIS — Z794 Long term (current) use of insulin: Secondary | ICD-10-CM | POA: Diagnosis not present

## 2024-02-01 DIAGNOSIS — Z79899 Other long term (current) drug therapy: Secondary | ICD-10-CM | POA: Insufficient documentation

## 2024-02-01 DIAGNOSIS — E1022 Type 1 diabetes mellitus with diabetic chronic kidney disease: Secondary | ICD-10-CM | POA: Diagnosis not present

## 2024-02-01 DIAGNOSIS — D593 Hemolytic-uremic syndrome, unspecified: Secondary | ICD-10-CM

## 2024-02-01 DIAGNOSIS — D5939 Other hemolytic-uremic syndrome: Secondary | ICD-10-CM | POA: Insufficient documentation

## 2024-02-01 DIAGNOSIS — Z7982 Long term (current) use of aspirin: Secondary | ICD-10-CM | POA: Insufficient documentation

## 2024-02-01 DIAGNOSIS — I120 Hypertensive chronic kidney disease with stage 5 chronic kidney disease or end stage renal disease: Secondary | ICD-10-CM | POA: Diagnosis not present

## 2024-02-01 LAB — CBC WITH DIFFERENTIAL (CANCER CENTER ONLY)
Abs Immature Granulocytes: 0.07 K/uL (ref 0.00–0.07)
Basophils Absolute: 0 K/uL (ref 0.0–0.1)
Basophils Relative: 0 %
Eosinophils Absolute: 0 K/uL (ref 0.0–0.5)
Eosinophils Relative: 0 %
HCT: 32.1 % — ABNORMAL LOW (ref 36.0–46.0)
Hemoglobin: 10.3 g/dL — ABNORMAL LOW (ref 12.0–15.0)
Immature Granulocytes: 1 %
Lymphocytes Relative: 7 %
Lymphs Abs: 0.8 K/uL (ref 0.7–4.0)
MCH: 27.3 pg (ref 26.0–34.0)
MCHC: 32.1 g/dL (ref 30.0–36.0)
MCV: 85.1 fL (ref 80.0–100.0)
Monocytes Absolute: 0.2 K/uL (ref 0.1–1.0)
Monocytes Relative: 2 %
Neutro Abs: 11 K/uL — ABNORMAL HIGH (ref 1.7–7.7)
Neutrophils Relative %: 90 %
Platelet Count: 430 K/uL — ABNORMAL HIGH (ref 150–400)
RBC: 3.77 MIL/uL — ABNORMAL LOW (ref 3.87–5.11)
RDW: 15.1 % (ref 11.5–15.5)
WBC Count: 12.2 K/uL — ABNORMAL HIGH (ref 4.0–10.5)
nRBC: 0 % (ref 0.0–0.2)

## 2024-02-01 LAB — CMP (CANCER CENTER ONLY)
ALT: 6 U/L (ref 0–44)
AST: 26 U/L (ref 15–41)
Albumin: 2.8 g/dL — ABNORMAL LOW (ref 3.5–5.0)
Alkaline Phosphatase: 190 U/L — ABNORMAL HIGH (ref 38–126)
Anion gap: 14 (ref 5–15)
BUN: 43 mg/dL — ABNORMAL HIGH (ref 6–20)
CO2: 23 mmol/L (ref 22–32)
Calcium: 6.7 mg/dL — ABNORMAL LOW (ref 8.9–10.3)
Chloride: 94 mmol/L — ABNORMAL LOW (ref 98–111)
Creatinine: 11.54 mg/dL (ref 0.44–1.00)
GFR, Estimated: 4 mL/min — ABNORMAL LOW (ref >60)
Glucose, Bld: 311 mg/dL — ABNORMAL HIGH (ref 70–99)
Potassium: 4.2 mmol/L (ref 3.5–5.1)
Sodium: 131 mmol/L — ABNORMAL LOW (ref 135–145)
Total Bilirubin: 0.5 mg/dL (ref 0.0–1.2)
Total Protein: 6.8 g/dL (ref 6.5–8.1)

## 2024-02-01 LAB — IRON AND TIBC
Iron: 135 ug/dL (ref 28–170)
Saturation Ratios: 85 % — ABNORMAL HIGH (ref 10.4–31.8)
TIBC: 160 ug/dL — ABNORMAL LOW (ref 250–450)
UIBC: 25 ug/dL

## 2024-02-01 LAB — FERRITIN: Ferritin: 1258 ng/mL — ABNORMAL HIGH (ref 11–307)

## 2024-02-01 LAB — LACTATE DEHYDROGENASE: LDH: 245 U/L — ABNORMAL HIGH (ref 98–192)

## 2024-02-01 NOTE — Progress Notes (Signed)
 Pt in for follow up, denies any concerns, taking iron  3 times a day.

## 2024-02-01 NOTE — Telephone Encounter (Signed)
 Critical lab value creatinine level 11.54 from [facility] lab Luke Horn, Tinnie Dawn NP notified.
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
## 2024-02-02 ENCOUNTER — Other Ambulatory Visit: Payer: Self-pay | Admitting: Nurse Practitioner

## 2024-02-02 LAB — HAPTOGLOBIN: Haptoglobin: 200 mg/dL (ref 33–278)

## 2024-02-07 ENCOUNTER — Ambulatory Visit: Admitting: Dermatology

## 2024-02-12 ENCOUNTER — Ambulatory Visit (HOSPITAL_COMMUNITY)
Admission: EM | Admit: 2024-02-12 | Discharge: 2024-02-12 | Disposition: A | Discharge status: Home / Self Care | Attending: Family Medicine | Admitting: Family Medicine

## 2024-02-12 ENCOUNTER — Encounter (HOSPITAL_COMMUNITY): Payer: Self-pay

## 2024-02-12 DIAGNOSIS — M545 Low back pain, unspecified: Secondary | ICD-10-CM | POA: Diagnosis not present

## 2024-02-12 DIAGNOSIS — Z3202 Encounter for pregnancy test, result negative: Secondary | ICD-10-CM

## 2024-02-12 LAB — POCT URINALYSIS DIP (MANUAL ENTRY)
Bilirubin, UA: NEGATIVE
Glucose, UA: NEGATIVE mg/dL
Ketones, POC UA: NEGATIVE mg/dL
Nitrite, UA: NEGATIVE
Protein Ur, POC: >=300 mg/dL — AB
Spec Grav, UA: 1.015 (ref 1.010–1.025)
Urobilinogen, UA: 0.2 U/dL
pH, UA: 8.5 — AB (ref 5.0–8.0)

## 2024-02-12 LAB — POCT URINE PREGNANCY: Preg Test, Ur: NEGATIVE

## 2024-02-12 MED ORDER — PREDNISONE 20 MG PO TABS: 40.0000 mg | ORAL_TABLET | Freq: Every day | ORAL | 0 refills | Status: DC

## 2024-02-12 NOTE — ED Triage Notes (Signed)
 Patient is here with right side flank pain that started x 2 weeks ago. Denies any recent injuries. Has use ice/heat and tylenol .

## 2024-02-14 ENCOUNTER — Telehealth: Payer: Self-pay | Admitting: *Deleted

## 2024-02-14 NOTE — Telephone Encounter (Signed)
 The patient said that when they talk to last time that the counts were not good enough so going to cancel the 10/22 and make it 4 weeks from the date.  Kelly Huff is sending a message to the schedulers so they can take off the 10/22 and push it up for 4 weeks.  When it has been set up they will call her.

## 2024-02-16 ENCOUNTER — Inpatient Hospital Stay (HOSPITAL_COMMUNITY): Admission: RE | Admit: 2024-02-16 | Source: Ambulatory Visit

## 2024-02-16 DIAGNOSIS — D5939 Other hemolytic-uremic syndrome: Secondary | ICD-10-CM

## 2024-02-16 NOTE — ED Provider Notes (Signed)
 [facility]   [id number] 02/12/24 Arrival Time: 1628  ASSESSMENT & PLAN:  1. Acute right-sided low back pain without sciatica   Likely MSK Trial of: Discharge Medication List as of 02/12/2024  5:33 PM     START taking these medications   Details  predniSONE  (DELTASONE ) 20 MG tablet Take 2 tablets (40 mg total) by mouth daily., Starting Sat 02/12/2024, Normal        Orders Placed This Encounter  Procedures   POCT urine pregnancy   POC urinalysis dipstick  UPT negative.  Results for orders placed or performed during the hospital encounter of 02/12/24  POC urinalysis dipstick   Collection Time: 02/12/24  5:20 PM  Result Value Ref Range   Color, UA yellow yellow   Clarity, UA cloudy (A) clear   Glucose, UA negative negative mg/dL   Bilirubin, UA negative negative   Ketones, POC UA negative negative mg/dL   Spec Grav, UA 8.984 8.989 - 1.025   Blood, UA moderate (A) negative   pH, UA 8.5 (A) 5.0 - 8.0   Protein Ur, POC >=300 (A) negative mg/dL   Urobilinogen, UA 0.2 0.2 or 1.0 E.U./dL   Nitrite, UA Negative Negative   Leukocytes, UA Trace (A) Negative  POCT urine pregnancy   Collection Time: 02/12/24  5:21 PM  Result Value Ref Range   Preg Test, Ur Negative Negative   No symptoms of UTI.  Work/school excuse note: not needed. Recommend:  Follow-up Information     [facility].   Why: If worsening or failing to improve as anticipated. Contact information: [name]  [address] [phone]                Reviewed expectations re: course of current medical issues. Questions answered. Outlined signs and symptoms indicating need for more acute intervention. Patient verbalized understanding. After Visit Summary given.  SUBJECTIVE: History from: patient. Kelly Huff is a 38 y.o. female who reports right side flank pain that started x 2 weeks ago. Denies any recent injuries. Has  use ice/heat and tylenol . Denies trauma. Normal bowel/bladder habits.   Past Surgical History:  Procedure Laterality Date   CAPD INSERTION N/A 04/26/2023   Procedure: LAPAROSCOPIC INSERTION CONTINUOUS AMBULATORY PERITONEAL DIALYSIS  (CAPD) CATHETER;  Surgeon: Magda Debby SAILOR, MD;  [facility];  Service: Vascular;  Laterality: N/A;   CAPD REMOVAL N/A 02/22/2023   Procedure: REMOVAL CONTINUOUS AMBULATORY PERITONEAL DIALYSIS (CAPD) CATHETER;  Surgeon: Magda Debby SAILOR, MD;  [facility]: [facility];  Service: Vascular;  Laterality: N/A;   CESAREAN SECTION     x 1   DILATION AND CURETTAGE OF UTERUS     x 4   INSERTION OF DIALYSIS CATHETER Right 02/22/2023   Procedure: INSERTION OF TUNNELED DIALYSIS CATHETER;  Surgeon: Magda Debby SAILOR, MD;  [facility]: [facility];  Service: Vascular;  Laterality: Right;   IR FLUORO GUIDE CV LINE RIGHT  12/05/2018   IR FLUORO GUIDE CV LINE RIGHT  12/13/2018   IR REMOVAL TUN CV CATH W/O FL  02/22/2019   IR US  GUIDE VASC ACCESS RIGHT  12/05/2018   IR US  GUIDE VASC ACCESS RIGHT  12/13/2018   LAPAROSCOPIC APPENDECTOMY N/A 12/04/2019   Procedure: APPENDECTOMY LAPAROSCOPIC;  Surgeon: Belinda Cough, MD;  [facility];  Service: General;  Laterality: N/A;   LAPAROSCOPIC LYSIS OF ADHESIONS N/A 10/21/2023   Procedure: LYSIS, ADHESIONS, LAPAROSCOPIC;  Surgeon: Magda Debby SAILOR, MD;  [facility];  Service: Vascular;  Laterality: N/A;   LAPAROSCOPY N/A 10/21/2023   Procedure: LAPAROSCOPY, DIAGNOSTIC;  Surgeon: Magda Debby SAILOR, MD;  [facility]: [facility];  Service: Vascular;  Laterality: N/A;   TUBAL LIGATION        OBJECTIVE:  Vitals:   02/12/24 1712  BP: (!) 178/102  Pulse: 88  Resp: 20  Temp: 98.2 F (36.8 C)  TempSrc: Oral  SpO2: 99%    General appearance: alert; no distress HEENT: [place]; AT Neck: supple with FROM Resp: unlabored respirations Back: vague soreness around R SI joint into upper buttock; no midline TTP Extremities: FROM bilat CV: brisk extremity  capillary refill of bilateral LE Skin: warm and dry; no visible rashes Neurologic: gait normal; normal sensation and strength of bilateral LE Psychological: alert and cooperative; normal mood and affect  Imaging: No results found.    Allergies  Allergen Reactions   Morphine Itching and Other (See Comments)    Hallucinations, also   Peanut Oil Swelling and Other (See Comments)    Tongue swelling as a child Can eat peanuts    Sulfamethoxazole-Trimethoprim Hives and Other (See Comments)   Covid-19 (Mrna) Vaccine Other (See Comments)    Was told to not take this again   Oxycodone  Hives    Had burning sensation similar to   Trulicity [Dulaglutide] Other (See Comments)    She is DM Type 1. 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   PONV (postoperative nausea and vomiting)    Social History   Socioeconomic History   Marital status: Married    Spouse name: Not on file   Number of children: Not on file   Years of education: Not on file   Highest education level: Not on file  Occupational History   Not on file  Tobacco Use   Smoking status: Never    Passive exposure: Never   Smokeless tobacco: Never  Vaping Use   Vaping status: Never Used  Substance and Sexual Activity   Alcohol use: No   Drug use: No   Sexual activity: Yes    Birth control/protection: Surgical    Comment: tubial ligation  Other Topics Concern    Not on file  Social History Narrative   Not on file   Social Drivers of Health   Financial Resource Strain: Low Risk  (10/06/2022)   Received from [facility]   Overall Financial Resource Strain (CARDIA)    Difficulty of Paying Living Expenses: Not hard at all  Food Insecurity: No Food Insecurity (01/07/2023)   Hunger Vital Sign    Worried About Running Out of Food in the Last Year: Never true    Ran Out of Food in the Last Year: Never true  Transportation Needs: No Transportation Needs (01/07/2023)   PRAPARE - Administrator, Civil Service (Medical): No    Lack of Transportation (Non-Medical):  No  Physical Activity: Not on file  Stress: Not on file  Social Connections: Not on file   Family History  Problem Relation Age of Onset   Hypertension Mother    Cancer Mother        Uterine vs cervical (pt unsure),    Schizophrenia Brother    Breast cancer Neg Hx    Past Surgical History:  Procedure Laterality Date   CAPD INSERTION N/A 04/26/2023   Procedure: LAPAROSCOPIC INSERTION CONTINUOUS AMBULATORY PERITONEAL DIALYSIS  (CAPD) CATHETER;  Surgeon: Magda Debby SAILOR, MD;  [facility];  Service: Vascular;  Laterality: N/A;   CAPD REMOVAL N/A 02/22/2023   Procedure: REMOVAL CONTINUOUS AMBULATORY PERITONEAL DIALYSIS (CAPD) CATHETER;  Surgeon: Magda Debby SAILOR, MD;  [facility]: [facility];  Service: Vascular;  Laterality: N/A;   CESAREAN SECTION     x 1   DILATION AND CURETTAGE OF UTERUS     x 4   INSERTION OF DIALYSIS CATHETER Right 02/22/2023   Procedure: INSERTION OF TUNNELED DIALYSIS CATHETER;  Surgeon: Magda Debby SAILOR, MD;  [facility];  Service: Vascular;  Laterality: Right;   IR FLUORO GUIDE CV LINE RIGHT  12/05/2018   IR FLUORO GUIDE CV LINE RIGHT  12/13/2018   IR REMOVAL TUN CV CATH W/O FL  02/22/2019   IR US  GUIDE VASC ACCESS RIGHT  12/05/2018   IR US  GUIDE VASC ACCESS RIGHT  12/13/2018   LAPAROSCOPIC APPENDECTOMY N/A 12/04/2019   Procedure:  APPENDECTOMY LAPAROSCOPIC;  Surgeon: Belinda Cough, MD;  [facility];  Service: General;  Laterality: N/A;   LAPAROSCOPIC LYSIS OF ADHESIONS N/A 10/21/2023   Procedure: LYSIS, ADHESIONS, LAPAROSCOPIC;  Surgeon: Magda Debby SAILOR, MD;  Location: [facility] OR;  Service: Vascular;  Laterality: N/A;   LAPAROSCOPY N/A 10/21/2023   Procedure: LAPAROSCOPY, DIAGNOSTIC;  Surgeon: Magda Debby SAILOR, MD;  [facility];  Service: Vascular;  Laterality: N/A;   TUBAL LINNA Rolinda Rogue, MD 02/16/24 [phone]

## 2024-02-18 ENCOUNTER — Other Ambulatory Visit: Payer: Self-pay | Admitting: Nephrology

## 2024-02-18 ENCOUNTER — Emergency Department (HOSPITAL_COMMUNITY)
Admission: EM | Admit: 2024-02-18 | Discharge: 2024-02-19 | Discharge status: Against Medical Advice | Attending: Emergency Medicine | Admitting: Emergency Medicine

## 2024-02-18 ENCOUNTER — Ambulatory Visit
Admission: RE | Admit: 2024-02-18 | Discharge: 2024-02-18 | Disposition: A | Discharge status: Home / Self Care | Source: Ambulatory Visit | Attending: Nephrology | Admitting: Nephrology

## 2024-02-18 ENCOUNTER — Other Ambulatory Visit: Payer: Self-pay

## 2024-02-18 ENCOUNTER — Encounter (HOSPITAL_COMMUNITY): Payer: Self-pay | Admitting: Emergency Medicine

## 2024-02-18 DIAGNOSIS — Z5321 Procedure and treatment not carried out due to patient leaving prior to being seen by health care provider: Secondary | ICD-10-CM | POA: Insufficient documentation

## 2024-02-18 DIAGNOSIS — T85691A Other mechanical complication of intraperitoneal dialysis catheter, initial encounter: Secondary | ICD-10-CM | POA: Diagnosis not present

## 2024-02-18 DIAGNOSIS — E8770 Fluid overload, unspecified: Secondary | ICD-10-CM | POA: Insufficient documentation

## 2024-02-18 DIAGNOSIS — I10 Essential (primary) hypertension: Secondary | ICD-10-CM | POA: Insufficient documentation

## 2024-02-18 DIAGNOSIS — T85611A Breakdown (mechanical) of intraperitoneal dialysis catheter, initial encounter: Secondary | ICD-10-CM

## 2024-02-18 DIAGNOSIS — Z992 Dependence on renal dialysis: Secondary | ICD-10-CM | POA: Insufficient documentation

## 2024-02-18 DIAGNOSIS — N186 End stage renal disease: Secondary | ICD-10-CM | POA: Diagnosis not present

## 2024-02-18 LAB — CBC
HCT: 30.3 % — ABNORMAL LOW (ref 36.0–46.0)
Hemoglobin: 9.6 g/dL — ABNORMAL LOW (ref 12.0–15.0)
MCH: 27.9 pg (ref 26.0–34.0)
MCHC: 31.7 g/dL (ref 30.0–36.0)
MCV: 88.1 fL (ref 80.0–100.0)
Platelets: 357 K/uL (ref 150–400)
RBC: 3.44 MIL/uL — ABNORMAL LOW (ref 3.87–5.11)
RDW: 15.5 % (ref 11.5–15.5)
WBC: 12.1 K/uL — ABNORMAL HIGH (ref 4.0–10.5)
nRBC: 0 % (ref 0.0–0.2)

## 2024-02-18 LAB — HCG, SERUM, QUALITATIVE: Preg, Serum: NEGATIVE

## 2024-02-18 NOTE — ED Triage Notes (Signed)
 Pt arrived from home via POV c/o fluid overload. Pt states that she is a peritoneal dialysis pt. Pt states that her port is not draining correctly and that she is fluid over loaded.with abd distension. Puffy face, hands and BLE. Pt noted to be hypertensive in triage.

## 2024-02-18 NOTE — ED Notes (Signed)
Pt called for vitals x3 no answer 

## 2024-02-19 ENCOUNTER — Inpatient Hospital Stay (HOSPITAL_COMMUNITY)
Admission: EM | Admit: 2024-02-19 | Discharge: 2024-02-22 | DRG: 919 | Disposition: A | Discharge status: Home / Self Care | Attending: Internal Medicine | Admitting: Internal Medicine

## 2024-02-19 ENCOUNTER — Encounter (HOSPITAL_COMMUNITY): Payer: Self-pay

## 2024-02-19 ENCOUNTER — Emergency Department (HOSPITAL_COMMUNITY)

## 2024-02-19 ENCOUNTER — Inpatient Hospital Stay (HOSPITAL_COMMUNITY)

## 2024-02-19 ENCOUNTER — Other Ambulatory Visit: Payer: Self-pay

## 2024-02-19 DIAGNOSIS — Z8249 Family history of ischemic heart disease and other diseases of the circulatory system: Secondary | ICD-10-CM

## 2024-02-19 DIAGNOSIS — D638 Anemia in other chronic diseases classified elsewhere: Secondary | ICD-10-CM | POA: Diagnosis present

## 2024-02-19 DIAGNOSIS — D5939 Other hemolytic-uremic syndrome: Secondary | ICD-10-CM | POA: Diagnosis present

## 2024-02-19 DIAGNOSIS — Z881 Allergy status to other antibiotic agents status: Secondary | ICD-10-CM | POA: Diagnosis not present

## 2024-02-19 DIAGNOSIS — E103219 Type 1 diabetes mellitus with mild nonproliferative diabetic retinopathy with macular edema, unspecified eye: Secondary | ICD-10-CM | POA: Diagnosis present

## 2024-02-19 DIAGNOSIS — N2581 Secondary hyperparathyroidism of renal origin: Secondary | ICD-10-CM | POA: Diagnosis present

## 2024-02-19 DIAGNOSIS — D72829 Elevated white blood cell count, unspecified: Secondary | ICD-10-CM | POA: Diagnosis present

## 2024-02-19 DIAGNOSIS — E10649 Type 1 diabetes mellitus with hypoglycemia without coma: Secondary | ICD-10-CM | POA: Diagnosis not present

## 2024-02-19 DIAGNOSIS — Z888 Allergy status to other drugs, medicaments and biological substances status: Secondary | ICD-10-CM

## 2024-02-19 DIAGNOSIS — Y812 Prosthetic and other implants, materials and accessory general- and plastic-surgery devices associated with adverse incidents: Secondary | ICD-10-CM | POA: Diagnosis present

## 2024-02-19 DIAGNOSIS — Z9641 Presence of insulin pump (external) (internal): Secondary | ICD-10-CM | POA: Diagnosis present

## 2024-02-19 DIAGNOSIS — D593 Hemolytic-uremic syndrome, unspecified: Secondary | ICD-10-CM | POA: Diagnosis not present

## 2024-02-19 DIAGNOSIS — T85691D Other mechanical complication of intraperitoneal dialysis catheter, subsequent encounter: Secondary | ICD-10-CM | POA: Diagnosis not present

## 2024-02-19 DIAGNOSIS — E8770 Fluid overload, unspecified: Secondary | ICD-10-CM | POA: Diagnosis present

## 2024-02-19 DIAGNOSIS — Z882 Allergy status to sulfonamides status: Secondary | ICD-10-CM

## 2024-02-19 DIAGNOSIS — I120 Hypertensive chronic kidney disease with stage 5 chronic kidney disease or end stage renal disease: Secondary | ICD-10-CM | POA: Diagnosis present

## 2024-02-19 DIAGNOSIS — E103299 Type 1 diabetes mellitus with mild nonproliferative diabetic retinopathy without macular edema, unspecified eye: Secondary | ICD-10-CM | POA: Diagnosis present

## 2024-02-19 DIAGNOSIS — D631 Anemia in chronic kidney disease: Secondary | ICD-10-CM | POA: Diagnosis present

## 2024-02-19 DIAGNOSIS — Z7982 Long term (current) use of aspirin: Secondary | ICD-10-CM | POA: Diagnosis not present

## 2024-02-19 DIAGNOSIS — T380X5A Adverse effect of glucocorticoids and synthetic analogues, initial encounter: Secondary | ICD-10-CM | POA: Diagnosis present

## 2024-02-19 DIAGNOSIS — Z992 Dependence on renal dialysis: Principal | ICD-10-CM

## 2024-02-19 DIAGNOSIS — Z794 Long term (current) use of insulin: Secondary | ICD-10-CM | POA: Diagnosis not present

## 2024-02-19 DIAGNOSIS — T85691A Other mechanical complication of intraperitoneal dialysis catheter, initial encounter: Principal | ICD-10-CM | POA: Diagnosis present

## 2024-02-19 DIAGNOSIS — Z7952 Long term (current) use of systemic steroids: Secondary | ICD-10-CM | POA: Diagnosis not present

## 2024-02-19 DIAGNOSIS — Z8719 Personal history of other diseases of the digestive system: Secondary | ICD-10-CM

## 2024-02-19 DIAGNOSIS — Z818 Family history of other mental and behavioral disorders: Secondary | ICD-10-CM

## 2024-02-19 DIAGNOSIS — E1022 Type 1 diabetes mellitus with diabetic chronic kidney disease: Secondary | ICD-10-CM | POA: Diagnosis present

## 2024-02-19 DIAGNOSIS — I160 Hypertensive urgency: Secondary | ICD-10-CM | POA: Diagnosis present

## 2024-02-19 DIAGNOSIS — N186 End stage renal disease: Principal | ICD-10-CM | POA: Diagnosis present

## 2024-02-19 DIAGNOSIS — Z79899 Other long term (current) drug therapy: Secondary | ICD-10-CM

## 2024-02-19 DIAGNOSIS — Z885 Allergy status to narcotic agent status: Secondary | ICD-10-CM

## 2024-02-19 DIAGNOSIS — Z887 Allergy status to serum and vaccine status: Secondary | ICD-10-CM

## 2024-02-19 LAB — BASIC METABOLIC PANEL WITH GFR
Anion gap: 16 — ABNORMAL HIGH (ref 5–15)
BUN: 66 mg/dL — ABNORMAL HIGH (ref 6–20)
CO2: 24 mmol/L (ref 22–32)
Calcium: 6.5 mg/dL — ABNORMAL LOW (ref 8.9–10.3)
Chloride: 96 mmol/L — ABNORMAL LOW (ref 98–111)
Creatinine, Ser: 11.66 mg/dL — ABNORMAL HIGH (ref 0.44–1.00)
GFR, Estimated: 4 mL/min — ABNORMAL LOW (ref >60)
Glucose, Bld: 88 mg/dL (ref 70–99)
Potassium: 3.2 mmol/L — ABNORMAL LOW (ref 3.5–5.1)
Sodium: 136 mmol/L (ref 135–145)

## 2024-02-19 LAB — HEPATITIS B SURFACE ANTIGEN: Hepatitis B Surface Ag: NONREACTIVE

## 2024-02-19 LAB — HIV ANTIBODY (ROUTINE TESTING W REFLEX): HIV Screen 4th Generation wRfx: NONREACTIVE

## 2024-02-19 LAB — CBG MONITORING, ED
Glucose-Capillary: 69 mg/dL — ABNORMAL LOW (ref 70–99)
Glucose-Capillary: 149 mg/dL — ABNORMAL HIGH (ref 70–99)

## 2024-02-19 LAB — GLUCOSE, CAPILLARY: Glucose-Capillary: 223 mg/dL — ABNORMAL HIGH (ref 70–99)

## 2024-02-19 LAB — BRAIN NATRIURETIC PEPTIDE: B Natriuretic Peptide: 1538.3 pg/mL — ABNORMAL HIGH (ref 0.0–100.0)

## 2024-02-19 MED ORDER — ONDANSETRON HCL 4 MG PO TABS: 4.0000 mg | ORAL_TABLET | Freq: Four times a day (QID) | ORAL | Status: DC | PRN

## 2024-02-19 MED ORDER — CALCITRIOL 0.25 MCG PO CAPS
0.2500 ug | ORAL_CAPSULE | Freq: Every day | ORAL | Status: DC
  Administered 2024-02-19 – 2024-02-22 (×4): 0.25 ug via ORAL
  Filled 2024-02-19 (×4): qty 1

## 2024-02-19 MED ORDER — CHLORHEXIDINE GLUCONATE CLOTH 2 % EX PADS
6.0000 | MEDICATED_PAD | Freq: Every day | CUTANEOUS | Status: DC
Start: 2024-02-20 — End: 2024-02-19

## 2024-02-19 MED ORDER — ACETAMINOPHEN 650 MG RE SUPP
650.0000 mg | Freq: Four times a day (QID) | RECTAL | Status: DC | PRN
Start: 2024-02-19 — End: 2024-02-22

## 2024-02-19 MED ORDER — HEPARIN SODIUM (PORCINE) 1000 UNIT/ML IJ SOLN
4000.0000 [IU] | Freq: Once | INTRAMUSCULAR | Status: AC
  Administered 2024-02-19: 4000 [IU]

## 2024-02-19 MED ORDER — HYDRALAZINE HCL 20 MG/ML IJ SOLN: 10.0000 mg | INTRAMUSCULAR | Status: DC | PRN

## 2024-02-19 MED ORDER — FUROSEMIDE 10 MG/ML IJ SOLN
160.0000 mg | Freq: Once | INTRAVENOUS | Status: AC
  Administered 2024-02-19: 160 mg via INTRAVENOUS
  Filled 2024-02-19: qty 16

## 2024-02-19 MED ORDER — DEXTROSE 50 % IV SOLN
1.0000 | INTRAVENOUS | Status: DC | PRN
  Administered 2024-02-21: 50 mL via INTRAVENOUS
  Filled 2024-02-19: qty 50

## 2024-02-19 MED ORDER — METOPROLOL SUCCINATE ER 100 MG PO TB24
100.0000 mg | ORAL_TABLET | Freq: Every day | ORAL | Status: DC
  Administered 2024-02-19 – 2024-02-22 (×4): 100 mg via ORAL
  Filled 2024-02-19 (×4): qty 1

## 2024-02-19 MED ORDER — HEPARIN SODIUM (PORCINE) 5000 UNIT/ML IJ SOLN
5000.0000 [IU] | Freq: Three times a day (TID) | INTRAMUSCULAR | Status: DC
  Administered 2024-02-19 – 2024-02-22 (×8): 5000 [IU] via SUBCUTANEOUS
  Filled 2024-02-19 (×7): qty 1

## 2024-02-19 MED ORDER — LORAZEPAM 2 MG/ML IJ SOLN
1.0000 mg | Freq: Once | INTRAMUSCULAR | Status: AC
  Administered 2024-02-19: 1 mg via INTRAVENOUS
  Filled 2024-02-19: qty 1

## 2024-02-19 MED ORDER — HEPARIN SODIUM (PORCINE) 1000 UNIT/ML DIALYSIS: 1000.0000 [IU] | INTRAMUSCULAR | Status: DC | PRN

## 2024-02-19 MED ORDER — DEXTROSE 50 % IV SOLN
1.0000 | Freq: Once | INTRAVENOUS | Status: AC
  Administered 2024-02-19: 50 mL via INTRAVENOUS
  Filled 2024-02-19: qty 50

## 2024-02-19 MED ORDER — SODIUM CHLORIDE 0.9% FLUSH
3.0000 mL | Freq: Two times a day (BID) | INTRAVENOUS | Status: DC
  Administered 2024-02-19 – 2024-02-22 (×6): 3 mL via INTRAVENOUS

## 2024-02-19 MED ORDER — FENTANYL CITRATE (PF) 50 MCG/ML IJ SOSY
25.0000 ug | PREFILLED_SYRINGE | INTRAMUSCULAR | Status: DC | PRN
  Administered 2024-02-20: 25 ug via INTRAVENOUS
  Filled 2024-02-19: qty 1

## 2024-02-19 MED ORDER — ACETAMINOPHEN 325 MG PO TABS
650.0000 mg | ORAL_TABLET | Freq: Four times a day (QID) | ORAL | Status: DC | PRN
Start: 2024-02-19 — End: 2024-02-22
  Administered 2024-02-20 (×2): 650 mg via ORAL
  Filled 2024-02-19 (×2): qty 2

## 2024-02-19 MED ORDER — AMLODIPINE BESYLATE 10 MG PO TABS
10.0000 mg | ORAL_TABLET | Freq: Every day | ORAL | Status: DC
Start: 2024-02-19 — End: 2024-02-22
  Administered 2024-02-19 – 2024-02-22 (×4): 10 mg via ORAL
  Filled 2024-02-19 (×4): qty 1

## 2024-02-19 MED ORDER — INSULIN PUMP
Freq: Three times a day (TID) | SUBCUTANEOUS | Status: DC
  Administered 2024-02-20: 1.8 via SUBCUTANEOUS
  Administered 2024-02-20: 0.2 via SUBCUTANEOUS
  Filled 2024-02-19: qty 1

## 2024-02-19 MED ORDER — CHLORHEXIDINE GLUCONATE CLOTH 2 % EX PADS
6.0000 | MEDICATED_PAD | Freq: Every day | CUTANEOUS | Status: DC
  Administered 2024-02-20 – 2024-02-22 (×3): 6 via TOPICAL

## 2024-02-19 MED ORDER — OXYCODONE HCL 5 MG PO TABS
5.0000 mg | ORAL_TABLET | Freq: Once | ORAL | Status: AC
  Administered 2024-02-19: 5 mg via ORAL
  Filled 2024-02-19: qty 1

## 2024-02-19 MED ORDER — ONDANSETRON HCL 4 MG/2ML IJ SOLN: 4.0000 mg | Freq: Four times a day (QID) | INTRAMUSCULAR | Status: DC | PRN

## 2024-02-19 MED ORDER — FENTANYL CITRATE (PF) 50 MCG/ML IJ SOSY
50.0000 ug | PREFILLED_SYRINGE | Freq: Once | INTRAMUSCULAR | Status: AC
  Administered 2024-02-19: 50 ug via INTRAVENOUS
  Filled 2024-02-19: qty 1

## 2024-02-19 MED ORDER — ALBUTEROL SULFATE (2.5 MG/3ML) 0.083% IN NEBU: 2.5000 mg | INHALATION_SOLUTION | RESPIRATORY_TRACT | Status: DC | PRN

## 2024-02-19 MED ORDER — PREDNISONE 20 MG PO TABS
40.0000 mg | ORAL_TABLET | Freq: Every day | ORAL | Status: DC
  Administered 2024-02-19 – 2024-02-22 (×2): 40 mg via ORAL
  Filled 2024-02-19 (×4): qty 2

## 2024-02-19 NOTE — H&P (Signed)
 History and Physical    Patient: Kelly Huff FMW:[id number] DOB: 1985/05/15 DOA: 02/19/2024 DOS: the patient was seen and examined on 02/19/2024 PCP: Gearline Norris, MD  Patient coming from: Home  Chief Complaint:  Chief Complaint  Patient presents with   Leg Pain   HPI: Kelly Huff is a 38 y.o. female with medical history significant of diabetes mellitus type 1, ESRD on home PD, HTN, recurrent PD associated peritonitis presents with fluid retention and difficulty in fluid removal during dialysis.  She has been experiencing fluid retention, particularly in the legs, over the past week. Despite doing peritoneal dialysis, she has been unable to remove sufficient fluid, leading to persistent swelling and pain in her legs. While some fluid can be removed at night, the swelling returns during the day.  In recent dialysis sessions, she has been unable to remove the full amount of fluid prescribed. For example, she was supposed to remove 1500 mL but only managed to remove 1200 mL. This issue has worsened, with significant discomfort noted due to heavy legs.  She has not taken her blood pressure medication today. Her blood pressure readings at home she reports have been elevated similarly.   Patient has been on vancomycin  for treatment of peritoneal dialysis peritonitis with report of last dose of vancomycin  yesterday evening.  She was supposed to have Ultimoris infusion on 10/22 but this was postponed in 4 weeks due to concerns of infection.  In the emergency department patient was noted to be afebrile with blood pressures elevated up to 210/106.  Labs obtained 02/18/2024 significant for WBC 12.1, hemoglobin 9.6, potassium 3.2, CO2 24, BUN 66, creatinine 11.66, calcium  6.5, and anion gap 16.  Nephrology had been formally consulted.  IR unable to place PD catheter at this time and therefore PCCM consulted and placed temporary dialysis catheter.  Patient had been ordered Lasix 160 mg IV as  well as given fentanyl  50 mcg IV  Review of Systems: As mentioned in the history of present illness. All other systems reviewed and are negative. 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   PONV (postoperative nausea and vomiting)    Past Surgical History:  Procedure Laterality Date   CAPD INSERTION N/A 04/26/2023   Procedure: LAPAROSCOPIC INSERTION CONTINUOUS AMBULATORY PERITONEAL DIALYSIS  (CAPD) CATHETER;  Surgeon: Magda Debby SAILOR, MD;  Location: [facility] OR;  Service: Vascular;  Laterality: N/A;   CAPD REMOVAL N/A 02/22/2023   Procedure: REMOVAL CONTINUOUS AMBULATORY PERITONEAL DIALYSIS (CAPD) CATHETER;  Surgeon: Magda Debby SAILOR, MD;  Location: [facility] OR;  Service: Vascular;  Laterality: N/A;   CESAREAN SECTION     x 1   DILATION AND CURETTAGE OF UTERUS     x 4   INSERTION OF DIALYSIS CATHETER Right 02/22/2023   Procedure: INSERTION OF TUNNELED DIALYSIS CATHETER;  Surgeon: Magda Debby SAILOR, MD;  Location: [facility] OR;  Service: Vascular;  Laterality: Right;   IR FLUORO GUIDE CV  LINE RIGHT  12/05/2018   IR FLUORO GUIDE CV LINE RIGHT  12/13/2018   IR REMOVAL TUN CV CATH W/O FL  02/22/2019   IR US  GUIDE VASC ACCESS RIGHT  12/05/2018   IR US  GUIDE VASC ACCESS RIGHT  12/13/2018   LAPAROSCOPIC APPENDECTOMY N/A 12/04/2019   Procedure: APPENDECTOMY LAPAROSCOPIC;  Surgeon: Belinda Cough, MD;  Location: [facility] OR;  Service: General;  Laterality: N/A;   LAPAROSCOPIC LYSIS OF ADHESIONS N/A  10/21/2023   Procedure: LYSIS, ADHESIONS, LAPAROSCOPIC;  Surgeon: Magda Debby SAILOR, MD;  Location: [facility] OR;  Service: Vascular;  Laterality: N/A;   LAPAROSCOPY N/A 10/21/2023   Procedure: LAPAROSCOPY, DIAGNOSTIC;  Surgeon: Magda Debby SAILOR, MD;  Location: [facility] OR;  Service: Vascular;  Laterality: N/A;   TUBAL LIGATION     Social History:  reports that she has never smoked. She has never been exposed to tobacco smoke. She has never used smokeless tobacco. She reports that she does not drink alcohol and does not use drugs.  Allergies  Allergen Reactions   Morphine Itching and Other (See Comments)    Hallucinations, also   Peanut Oil Swelling and Other (See Comments)    Tongue swelling as a child Can eat peanuts    Sulfamethoxazole-Trimethoprim Hives and Other (See Comments)   Covid-19 (Mrna) Vaccine Other (See Comments)    Was told to not take this again   Oxycodone  Hives    Had burning sensation similar to   Trulicity [Dulaglutide] Other (See Comments)    She is DM Type 1. Was prescribed but damaged her kidneys    Family History  Problem Relation Age of Onset   Hypertension Mother    Cancer Mother        Uterine vs cervical (pt unsure),    Schizophrenia Brother    Breast cancer Neg Hx     Prior to Admission medications   Medication Sig Start Date End Date Taking? Authorizing Provider  amLODipine  (NORVASC ) 10 MG tablet Take 10 mg by mouth.    [provider]  aspirin  EC 81 MG tablet Take 81 mg by mouth daily. Swallow whole.    [provider]  AURYXIA 1 GM 210 MG(Fe) tablet Take 630 mg by mouth See admin instructions. Take 630 mg by mouth three times a day with meals and 210 mg with each snack    [provider]  calcitRIOL (ROCALTROL) 0.25 MCG capsule Take 0.25 mcg by mouth daily. 09/30/23   [provider]  Collagen-Vitamin C-Biotin (COLLAGEN PO) Take 1 Scoop by mouth daily. In liquid    [provider]  Continuous Glucose Sensor (DEXCOM G6  SENSOR) MISC Inject 1 Device into the skin See admin instructions. Place 1 new device into the skin every 10 days    [provider]  cyclobenzaprine  (FLEXERIL ) 10 MG tablet Take 10 mg by mouth 3 (three) times daily as needed for muscle spasms.    [provider]  Digestive Enzyme CAPS Take 1 capsule by mouth daily.  prebiotics probiotics    [provider]  Glucagon  3 MG/DOSE POWD Place 3 mg into the nose daily as needed (low glucose). 10/07/23   [provider]  Insulin  Disposable Pump (OMNIPOD 5 G6 PODS, GEN 5,) MISC Inject 1 Device into the skin every 3 (three) days.    [provider]  insulin  lispro (HUMALOG ) 100 UNIT/ML injection Continue use of insulin  pump with setting as previous: Restart in the morning of 03/23/2020 as Lantus  was given on  the morning of 03/22/2020. 03/22/20   Swayze, Ava, DO  metoprolol  succinate (TOPROL -XL) 100 MG 24 hr tablet Take 100 mg by mouth daily. 08/10/23   [provider]  mupirocin  ointment (BACTROBAN ) 2 % Apply 1 Application topically 2 (two) times daily. 10/25/23   Harris, Abigail, PA-C  oxyCODONE -acetaminophen  (PERCOCET) 5-325 MG tablet Take 1 tablet by mouth every 6 (six) hours as needed. 10/21/23   Rhyne, Samantha J, PA-C  predniSONE  (DELTASONE ) 20 MG tablet Take 2 tablets (40 mg total) by mouth daily. 02/12/24   Rolinda Rogue, MD  traZODone  (DESYREL ) 50 MG tablet Take 50 mg by mouth at bedtime as needed for sleep.    [provider]    Physical Exam: Vitals:   02/19/24 0807 02/19/24 1046 02/19/24 1253 02/19/24 1415  BP:  (!) 210/106 (!) 185/94 (!) 205/106  Pulse:  88 85 87  Resp:  17 18 14   Temp:   97.7 F (36.5 C)   TempSrc:   Oral   SpO2:  100% 100% 100%  Weight: 83 kg     Height: 5' 7 (1.702 m)      Constitutional: Young female currently on no acute distress Eyes: PERRL, lids and conjunctivae normal ENMT: Mucous membranes are moist. Posterior pharynx clear of any exudate or  lesions. Neck: normal, supple,  Respiratory: clear to auscultation bilaterally, no wheezing, no crackles. Normal respiratory effort. No accessory muscle use.  Cardiovascular: Regular rate and rhythm, no murmurs / rubs / gallops. No extremity edema. 2+ pedal pulses. Abdomen: no tenderness, no masses palpated.  PD catheter in place.  Bowel sounds positive.  Musculoskeletal: no clubbing / cyanosis. No joint deformity upper and lower extremities. Good ROM, no contractures.  Skin: no rashes, lesions, ulcers. No induration Neurologic: CN 2-12 grossly intact. Strength 5/5 in all 4.  Psychiatric: Normal judgment and insight. Alert and oriented x 3. Normal mood.   Data Reviewed:  EKG reveals sinus rhythm at 81 bpm.  Reviewed labs, imaging, and pertinent records as documented.  Assessment and Plan:  Mechanical complication of peritoneal dialysis catheter Fluid overload ESRD on PD  Acute.  Patient presents with complaints of her peritoneal dialysis catheter not functioning properly and her retaining fluid.  On physical exam patient with at least 1+ pitting bilateral lower extremity edema.  Labs noted BNP 1538.3, potassium 3.2, BUN 66, creatinine 11.66, and anion gap 16.  Nephrology consulted for need of dialysis.  PCCM consulted for temporary hemodialysis catheter as IR unable to place catheter at this time.  Patient had been given 100 - Admit to a progressive bed - Check renal function panel daily - PCCM consulted for temporary dialysis catheter placement - Appreciate nephrology consultative services, we will follow-up for further recommendations  Hypertensive urgency Blood pressures elevated up to 210/106.  Patient reported not taking blood pressure medications today. - Resume metoprolol  and amlodipine  - Hydralazine  IV as needed  History of recurrent peritoneal dialysis peritonitis Patient had just completed course with IV vancomycin  for peritonitis. - Check peritoneal fluid  culture  Leukocytosis Acute.  WBC elevated at 12.1 which appears similar to prior. - Continue to monitor  Hemolytic uremic syndrome Patient on Ultimoris infusions every 12 weeks and is followed by [facility].  02/16/24 infusion pushed back 4 weeks due to peritonitis infection. - Continue prednisone  - Continue outpatient follow-up  Diabetes mellitus type 1 on insulin  pump Patient followed by Dr. Faythe in the outpatient setting.  Last hemoglobin A1c noted to be 6.1.  Patient request to  keep on insulin  pump at this time despite having a low 69 for which she received Abbott dextrose  - Hypoglycemia protocols - Amp of D50 as needed for low blood sugars - Insulin  pump order set utilized  Anemia of chronic kidney disease Hemoglobin 9.6 which appears around patient's baseline. - Continue to monitor  DVT prophylaxis: Heparin  Advance Care Planning:   Code Status: Full Code   Consults: PCCM, nephrology  Family Communication: Patient's mother updated at bedside Severity of Illness: The appropriate patient status for this patient is INPATIENT. Inpatient status is judged to be reasonable and necessary in order to provide the required intensity of service to ensure the patient's safety. The patient's presenting symptoms, physical exam findings, and initial radiographic and laboratory data in the context of their chronic comorbidities is felt to place them at high risk for further clinical deterioration. Furthermore, it is not anticipated that the patient will be medically stable for discharge from the hospital within 2 midnights of admission.   * I certify that at the point of admission it is my clinical judgment that the patient will require inpatient hospital care spanning beyond 2 midnights from the point of admission due to high intensity of service, high risk for further deterioration and high frequency of surveillance required.*  Author: Maximino DELENA Sharps, MD 02/19/2024 2:43 PM  For on call review  [email or web address].

## 2024-02-19 NOTE — Procedures (Signed)
 Central Venous Catheter Insertion Procedure Note  Kelly Huff  [id number]  December 06, 1985  Date:02/19/24  Time:3:58 PM   Provider Performing:Tareek Sabo D. Harris   Procedure: Insertion of Non-tunneled Central Venous Catheter(36556)with US  guidance (23062)    Indication(s) Hemodialysis  Consent Risks of the procedure as well as the alternatives and risks of each were explained to the patient and/or caregiver.  Consent for the procedure was obtained and is signed in the bedside chart  Anesthesia Topical only with 1% lidocaine    Timeout Verified patient identification, verified procedure, site/side was marked, verified correct patient position, special equipment/implants available, medications/allergies/relevant history reviewed, required imaging and test results available.  Sterile Technique Maximal sterile technique including full sterile barrier drape, hand hygiene, sterile gown, sterile gloves, mask, hair covering, sterile ultrasound probe cover (if used).  Procedure Description Area of catheter insertion was cleaned with chlorhexidine  and draped in sterile fashion.   With real-time ultrasound guidance a HD catheter was placed into the right internal jugular vein.  Nonpulsatile blood flow and easy flushing noted in all ports.  The catheter was sutured in place and sterile dressing applied.  Complications/Tolerance None; patient tolerated the procedure well. Chest X-ray is ordered to verify placement for internal jugular or subclavian cannulation.  Chest x-ray is not ordered for femoral cannulation.  EBL Minimal  Specimen(s) None  Kelly Huff D. Harris, NP-C [facility] Personal contact information can be found on Amion  If no contact or response made please call [phone] 02/19/2024, 3:59 PM

## 2024-02-19 NOTE — Progress Notes (Signed)
   02/19/24 2305  Vitals  BP (!) 170/94  MAP (mmHg) 117  Pulse Rate 76  ECG Heart Rate 76  Oxygen Therapy  SpO2 100 %  During Treatment Monitoring  Blood Flow Rate (mL/min) 330 mL/min  Arterial Pressure (mmHg) -120.2 mmHg  Venous Pressure (mmHg) 176.96 mmHg  TMP (mmHg) 13.33 mmHg  Ultrafiltration Rate (mL/min) 1116 mL/min  Dialysate Flow Rate (mL/min) 299 ml/min  Dialysate Potassium Concentration 3  Dialysate Calcium  Concentration 2.5  Duration of HD Treatment -hour(s) 3.5 hour(s)  Cumulative Fluid Removed (mL) per Treatment  3000.23  HD Safety Checks Performed Yes  Intra-Hemodialysis Comments Tx completed  Post Treatment  Dialyzer Clearance Lightly streaked  Liters Processed 70.9  Fluid Removed (mL) 3000 mL  Tolerated HD Treatment Yes

## 2024-02-19 NOTE — Consult Note (Addendum)
 [facility] Renal Consultation Note    Indication for Consultation:  Management of ESRD/hemodialysis; anemia, hypertension/volume and secondary hyperparathyroidism  ERE:Leunw, Almarie, MD  HPI: Kelly Huff is a 38 y.o. female with ESRD on PD  at [facility]. Her primary nephrologist is Dr. Gearline.   Seen and examined patient at bedside. She reports her PD catheter hasn't been draining properly for the past 2 days. She reported trying to stand up to assess whether the draining will improve but it didn't. She ended up developing b/l leg pain. She is currently being treated for coag negative peritonitis at her outpatient kidney center. She reports her last Vancomycin  dose was supposed to be last night. Today, she presents to the ED with volume overload. She remains on RA and denies SOB, CP, ABD pain, and N/V. CXR is unremarkable. Labs include: Na 136, K+ 3.2, Ca 6.5, BNP 1538, and Hgb 9.6. Discussed with the Primary team and IR. Patient will need a HD catheter placed and transition to IHD for now until her infection clears. I was informed by IR they will not be able to place the line since there is only 1 Radiologist on-call. Discussed with the Primary team. PCCM consulted for temp HD catheter placement for today. I also reached out to [facility] since the PD will eventually need to be removed. Discussed plan with patient and family who were at bedside.  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   PONV (postoperative nausea and vomiting)    Past Surgical History:  Procedure Laterality Date   CAPD INSERTION N/A 04/26/2023   Procedure: LAPAROSCOPIC INSERTION CONTINUOUS AMBULATORY PERITONEAL DIALYSIS  (CAPD) CATHETER;  Surgeon: Magda Debby SAILOR, MD;  Location: [facility] OR;  Service: Vascular;  Laterality: N/A;   CAPD REMOVAL N/A 02/22/2023   Procedure: REMOVAL CONTINUOUS AMBULATORY PERITONEAL DIALYSIS (CAPD) CATHETER;  Surgeon: Magda Debby SAILOR, MD;  Location: [facility] OR;  Service: Vascular;  Laterality: N/A;   CESAREAN SECTION     x 1   DILATION AND CURETTAGE OF UTERUS     x 4   INSERTION OF DIALYSIS CATHETER Right 02/22/2023   Procedure: INSERTION OF TUNNELED DIALYSIS CATHETER;  Surgeon: Magda Debby SAILOR, MD;  Location: [facility] OR;  Service: Vascular;  Laterality: Right;   IR FLUORO GUIDE CV LINE RIGHT  12/05/2018   IR FLUORO GUIDE CV LINE RIGHT  12/13/2018   IR REMOVAL TUN CV CATH W/O FL  02/22/2019   IR US  GUIDE VASC ACCESS RIGHT  12/05/2018   IR US  GUIDE VASC ACCESS RIGHT  12/13/2018   LAPAROSCOPIC APPENDECTOMY N/A 12/04/2019   Procedure: APPENDECTOMY LAPAROSCOPIC;  Surgeon: Belinda Cough, MD;  Location: [facility] OR;  Service: General;  Laterality: N/A;   LAPAROSCOPIC LYSIS OF ADHESIONS N/A 10/21/2023   Procedure: LYSIS, ADHESIONS, LAPAROSCOPIC;  Surgeon: Magda Debby SAILOR, MD;  Location: [facility] OR;  Service: Vascular;  Laterality: N/A;   LAPAROSCOPY N/A 10/21/2023  Procedure: LAPAROSCOPY, DIAGNOSTIC;  Surgeon: Magda Debby SAILOR, MD;  Location: [facility] OR;  Service: Vascular;  Laterality: N/A;   TUBAL LIGATION     Family History  Problem Relation Age of Onset   Hypertension Mother    Cancer Mother        Uterine vs cervical (pt unsure),    Schizophrenia Brother    Breast cancer Neg Hx    Social History:  reports that she has never smoked. She has never been exposed to tobacco smoke. She has never used smokeless tobacco. She reports that she does not drink alcohol  and does not use drugs. Allergies  Allergen Reactions   Morphine Itching and Other (See Comments)    Hallucinations, also   Peanut Oil Swelling and Other (See Comments)    Tongue swelling as a child Can eat peanuts    Sulfamethoxazole-Trimethoprim Hives and Other (See Comments)   Covid-19 (Mrna) Vaccine Other (See Comments)    Was told to not take this again   Oxycodone  Hives    Had burning sensation similar to   Trulicity [Dulaglutide] Other (See Comments)    She is DM Type 1. Was prescribed but damaged her kidneys   Prior to Admission medications   Medication Sig Start Date End Date Taking? Authorizing Provider  amLODipine  (NORVASC ) 10 MG tablet Take 10 mg by mouth.    [provider]  aspirin  EC 81 MG tablet Take 81 mg by mouth daily. Swallow whole.    [provider]  AURYXIA 1 GM 210 MG(Fe) tablet Take 630 mg by mouth See admin instructions. Take 630 mg by mouth three times a day with meals and 210 mg with each snack    [provider]  calcitRIOL (ROCALTROL) 0.25 MCG capsule Take 0.25 mcg by mouth daily. 09/30/23   [provider]  Collagen-Vitamin C-Biotin (COLLAGEN PO) Take 1 Scoop by mouth daily. In liquid    [provider]  Continuous Glucose Sensor (DEXCOM G6 SENSOR) MISC Inject 1 Device into the skin See admin instructions. Place 1 new device into the skin every 10 days    [provider]  cyclobenzaprine  (FLEXERIL ) 10 MG tablet Take 10 mg by mouth 3 (three) times daily as needed for muscle spasms.    [provider]  Digestive Enzyme CAPS Take 1 capsule by mouth daily.  prebiotics probiotics    [provider]  Glucagon  3 MG/DOSE POWD Place 3 mg into the nose daily as needed (low glucose). 10/07/23   [provider]  Insulin  Disposable Pump (OMNIPOD 5 G6 PODS, GEN 5,) MISC Inject 1 Device into the skin every 3 (three) days.    [provider]  insulin  lispro (HUMALOG ) 100 UNIT/ML injection  Continue use of insulin  pump with setting as previous: Restart in the morning of 03/23/2020 as Lantus  was given on the morning of 03/22/2020. 03/22/20   Swayze, Ava, DO  metoprolol  succinate (TOPROL -XL) 100 MG 24 hr tablet Take 100 mg by mouth daily. 08/10/23   [provider]  mupirocin  ointment (BACTROBAN ) 2 % Apply 1 Application topically 2 (two) times daily. 10/25/23   Harris, Abigail, PA-C  oxyCODONE -acetaminophen  (PERCOCET) 5-325 MG tablet Take 1 tablet by mouth every 6 (six) hours as needed. 10/21/23   Rhyne, Samantha J, PA-C  predniSONE  (DELTASONE ) 20 MG tablet Take 2 tablets (40 mg total) by mouth daily. 02/12/24   Rolinda Rogue, MD  traZODone  (DESYREL ) 50 MG tablet Take 50 mg by mouth at bedtime as needed  for sleep.    [provider]   No current facility-administered medications for this encounter.   Current Outpatient Medications  Medication Sig Dispense Refill   amLODipine  (NORVASC ) 10 MG tablet Take 10 mg by mouth.     aspirin  EC 81 MG tablet Take 81 mg by mouth daily. Swallow whole.     AURYXIA 1 GM 210 MG(Fe) tablet Take 630 mg by mouth See admin instructions. Take 630 mg by mouth three times a day with meals and 210 mg with each snack     calcitRIOL (ROCALTROL) 0.25 MCG capsule Take 0.25 mcg by mouth daily.     Collagen-Vitamin C-Biotin (COLLAGEN PO) Take 1 Scoop by mouth daily. In liquid     Continuous Glucose Sensor (DEXCOM G6 SENSOR) MISC Inject 1 Device into the skin See admin instructions. Place 1 new device into the skin every 10 days     cyclobenzaprine  (FLEXERIL ) 10 MG tablet Take 10 mg by mouth 3 (three) times daily as needed for muscle spasms.     Digestive Enzyme CAPS Take 1 capsule by mouth daily.  prebiotics probiotics     Glucagon  3 MG/DOSE POWD Place 3 mg into the nose daily as needed (low glucose).     Insulin  Disposable Pump (OMNIPOD 5 G6 PODS, GEN 5,) MISC Inject 1 Device into the skin every 3 (three) days.     insulin  lispro (HUMALOG ) 100  UNIT/ML injection Continue use of insulin  pump with setting as previous: Restart in the morning of 03/23/2020 as Lantus  was given on the morning of 03/22/2020. 10 mL 11   metoprolol  succinate (TOPROL -XL) 100 MG 24 hr tablet Take 100 mg by mouth daily.     mupirocin  ointment (BACTROBAN ) 2 % Apply 1 Application topically 2 (two) times daily. 60 g 0   oxyCODONE -acetaminophen  (PERCOCET) 5-325 MG tablet Take 1 tablet by mouth every 6 (six) hours as needed. 12 tablet 0   predniSONE  (DELTASONE ) 20 MG tablet Take 2 tablets (40 mg total) by mouth daily. 10 tablet 0   traZODone  (DESYREL ) 50 MG tablet Take 50 mg by mouth at bedtime as needed for sleep.     Facility-Administered Medications Ordered in Other Encounters  Medication Dose Route Frequency Provider Last Rate Last Admin   sodium chloride  flush (NS) 0.9 % injection 10 mL  10 mL Intravenous PRN Brahmanday, Govinda R, MD       Labs: Basic Metabolic Panel: Recent Labs  Lab 02/18/24 2315  NA 136  K 3.2*  CL 96*  CO2 24  GLUCOSE 88  BUN 66*  CREATININE 11.66*  CALCIUM  6.5*   Liver Function Tests: No results for input(s): AST, ALT, ALKPHOS, BILITOT, PROT, ALBUMIN  in the last 168 hours. No results for input(s): LIPASE, AMYLASE in the last 168 hours. No results for input(s): AMMONIA in the last 168 hours. CBC: Recent Labs  Lab 02/18/24 2315  WBC 12.1*  HGB 9.6*  HCT 30.3*  MCV 88.1  PLT 357   Cardiac Enzymes: No results for input(s): CKTOTAL, CKMB, CKMBINDEX, TROPONINI in the last 168 hours. CBG: Recent Labs  Lab 02/19/24 1219  GLUCAP 69*   Iron  Studies: No results for input(s): IRON , TIBC, TRANSFERRIN, FERRITIN in the last 72 hours. Studies/Results: DG Chest 2 View Result Date: 02/19/2024 CLINICAL DATA:  Bilateral lower leg pain. EXAM: CHEST - 2 VIEW COMPARISON:  February 22, 2023 FINDINGS: The heart size and mediastinal contours are within normal limits. Both lungs are clear. The visualized  skeletal structures are unremarkable.  IMPRESSION: No active cardiopulmonary disease. Electronically Signed   By: Suzen Dials M.D.   On: 02/19/2024 09:23    ROS: All others negative except those listed in HPI.   Physical Exam: Vitals:   02/19/24 0803 02/19/24 0807 02/19/24 1046 02/19/24 1253  BP: (!) 185/93  (!) 210/106 (!) 185/94  Pulse: 82  88 85  Resp: 19  17 18   Temp: 99.1 F (37.3 C)   97.7 F (36.5 C)  TempSrc: Oral   Oral  SpO2: 100%  100% 100%  Weight:  83 kg    Height:  5' 7 (1.702 m)       General: Awake, alert, NAD Head: Sclera not icteric  Lungs: CTA bilaterally. No wheeze, rales or rhonchi. Breathing is unlabored. Heart: RRR. No murmur, rubs or gallops.  Abdomen: soft and non-tender; PD catheter RUQ Lower extremities: 1+ LE edema Neuro: AAOx3. Moves all extremities spontaneously. Dialysis Access: PD catheter (malfunction)  Dialysis Orders:  [facility] Primary Nephrologist: Dr. Gearline 5 Exchanges Fill volume: Dwell: 2 hrs EDW 72kg  Assessment/Plan: Coag negative peritonitis - PD catheter now malfunctioned. Supposed to get her last Vancomycin  dose last night. Plan for temp HD catheter placement by PCCM for transition to IHD for now until infection clears. She will need a TDC eventually. Reached out to [facility] since the PD will likely need to come out as well. Ordered cell count and gram stain before PD catheter removal. ESRD - on PD but PD catheter is malfunction. See above. She will need to be clipped to [facility]. She made it clear she does not want to go back to an outpatient HD center. Hypertension/volume- BPs are up 2nd volume overload. Transitioning to IHD (see above). Resume home BP medications Anemia of CKD - Hgb 9.6, monitor trend Secondary Hyperparathyroidism -  Checking phos in AM Nutrition - Renal diet with fluid restriction Dispo - Recommend admission as she will need catheter placement, transition to IHD, then clipped to  [facility].  Charmaine Piety, NP [facility] 02/19/2024, 1:04 PM

## 2024-02-19 NOTE — Plan of Care (Signed)
  Problem: Education: Goal: Knowledge of General Education information will improve Description: Including pain rating scale, medication(s)/side effects and non-pharmacologic comfort measures Outcome: Progressing   Problem: Health Behavior/Discharge Planning: Goal: Ability to manage health-related needs will improve Outcome: Progressing   Problem: Clinical Measurements: Goal: Will remain free from infection Outcome: Progressing Goal: Diagnostic test results will improve Outcome: Progressing Goal: Respiratory complications will improve Outcome: Progressing   Problem: Activity: Goal: Risk for activity intolerance will decrease Outcome: Progressing   Problem: Nutrition: Goal: Adequate nutrition will be maintained Outcome: Progressing   Problem: Coping: Goal: Level of anxiety will decrease Outcome: Progressing   Problem: Elimination: Goal: Will not experience complications related to bowel motility Outcome: Progressing Goal: Will not experience complications related to urinary retention Outcome: Progressing   Problem: Pain Managment: Goal: General experience of comfort will improve and/or be controlled Outcome: Progressing   Problem: Safety: Goal: Ability to remain free from injury will improve Outcome: Progressing   Problem: Skin Integrity: Goal: Risk for impaired skin integrity will decrease Outcome: Progressing

## 2024-02-19 NOTE — ED Triage Notes (Signed)
 Pt to ED via EMS with c/o bilateral lower leg pain that started this morning during at home peritoneal dialysis. Pt states she stood up to get the fluid off and felt squeezing pain. Pt given 50mcg fentanyl  en route with some relief. Pt A&Ox4.

## 2024-02-19 NOTE — ED Provider Notes (Signed)
 [facility] Provider Note   CSN: [id number] Arrival date & time: 02/19/24  9244     Patient presents with: Leg Pain   Kelly Huff is a 38 y.o. female with past medical history of type 1 diabetes on peritoneal dialysis, recent peritonitis, a HUS, who presents emergency department for evaluation of bilateral pedal edema.  Patient reported to the emergency department yesterday for evaluation and left due to long wait time.  She was undergoing peritoneal dialysis at home today, when she noticed bilateral lower leg pain and fluid retention.  Of note, she was recently treated for peritonitis and was supposed to complete her final antibiotic dose yesterday.  She did not take her final dose.  She has no known heart failure.  Patient has not missed any peritoneal dialysis sessions.  She currently denies abdominal pain, nausea, vomiting, chest pain, shortness of breath.    Leg Pain      Prior to Admission medications   Medication Sig Start Date End Date Taking? Authorizing Provider  amLODipine  (NORVASC ) 10 MG tablet Take 10 mg by mouth.    [provider]  aspirin  EC 81 MG tablet Take 81 mg by mouth daily. Swallow whole.    [provider]  AURYXIA 1 GM 210 MG(Fe) tablet Take 630 mg by mouth See admin instructions. Take 630 mg by mouth three times a day with meals and 210 mg with each snack    [provider]  calcitRIOL (ROCALTROL) 0.25 MCG capsule Take 0.25 mcg by mouth daily. 09/30/23   [provider]  Collagen-Vitamin C-Biotin (COLLAGEN PO) Take 1 Scoop by mouth daily. In liquid    [provider]  Continuous Glucose Sensor (DEXCOM G6 SENSOR) MISC Inject 1 Device into the skin See admin instructions. Place 1 new device into the skin every 10 days    [provider]  cyclobenzaprine  (FLEXERIL ) 10 MG tablet Take 10 mg by mouth 3 (three) times daily as needed for muscle spasms.    [provider]  Digestive Enzyme CAPS Take 1 capsule by mouth daily.  prebiotics probiotics    [provider]  Glucagon  3 MG/DOSE POWD Place 3 mg into the nose daily as needed (low glucose). 10/07/23   [provider]  Insulin  Disposable Pump (OMNIPOD 5 G6 PODS, GEN 5,) MISC Inject 1 Device into the skin every 3 (three) days.    [provider]  insulin  lispro (HUMALOG ) 100 UNIT/ML injection Continue use of insulin  pump with setting as previous: Restart in the morning of 03/23/2020 as Lantus  was given on the morning of 03/22/2020. 03/22/20   Swayze, Ava, DO  metoprolol  succinate (TOPROL -XL) 100 MG 24 hr tablet Take 100 mg by mouth daily. 08/10/23   [provider]  mupirocin  ointment (BACTROBAN ) 2 % Apply 1 Application topically 2 (two) times daily. 10/25/23   Harris, Abigail, PA-C  oxyCODONE -acetaminophen  (PERCOCET) 5-325 MG tablet Take 1 tablet by mouth every 6 (six) hours as needed. 10/21/23   Rhyne, Samantha J, PA-C  predniSONE  (DELTASONE ) 20 MG tablet Take 2 tablets (40 mg total) by mouth daily. 02/12/24   Rolinda Rogue, MD  traZODone  (DESYREL ) 50 MG tablet Take 50 mg by mouth at bedtime as needed for sleep.    [provider]    Allergies: Morphine, Peanut oil, Sulfamethoxazole-trimethoprim, Covid-19 (mrna) vaccine, Oxycodone , and Trulicity [dulaglutide]    Review of Systems  Cardiovascular:  Positive for leg swelling.    Updated  Vital Signs BP (!) 205/106   Pulse 87   Temp 97.7 F (36.5 C) (Oral)   Resp 14   Ht 5' 7 (1.702 m)   Wt 83 kg   SpO2 100%   BMI 28.66 kg/m   Physical Exam Vitals and nursing note reviewed.  Constitutional:      Appearance: Normal appearance.  HENT:     Head: Normocephalic and atraumatic.     Mouth/Throat:     Mouth: Mucous membranes are moist.  Eyes:     General: No scleral icterus.       Right eye: No discharge.        Left eye: No discharge.     Conjunctiva/sclera: Conjunctivae normal.   Cardiovascular:     Rate and Rhythm: Normal rate and regular rhythm.     Pulses: Normal pulses.  Pulmonary:     Effort: Pulmonary effort is normal.     Breath sounds: Normal breath sounds.     Comments: Lungs clear to auscultation bilaterally Abdominal:     General: There is no distension.     Tenderness: There is no abdominal tenderness.  Musculoskeletal:        General: No deformity.     Cervical back: Normal range of motion.     Right lower leg: Edema present.     Left lower leg: Edema present.     Comments: 3+ bilateral lower extremity edema  Skin:    General: Skin is warm and dry.     Capillary Refill: Capillary refill takes less than 2 seconds.  Neurological:     Mental Status: She is alert.     Motor: No weakness.  Psychiatric:        Mood and Affect: Mood normal.     (all labs ordered are listed, but only abnormal results are displayed) Labs Reviewed  CBG MONITORING, ED - Abnormal; Notable for the following components:      Result Value   Glucose-Capillary 69 (*)    All other components within normal limits  CBG MONITORING, ED - Abnormal; Notable for the following components:   Glucose-Capillary 149 (*)    All other components within normal limits  BODY FLUID CULTURE W GRAM STAIN  BODY FLUID CELL COUNT WITH DIFFERENTIAL  HEPATITIS B SURFACE ANTIGEN  HEPATITIS B SURFACE ANTIBODY, QUANTITATIVE    EKG: EKG Interpretation Date/Time:  Saturday February 19 2024 09:12:26 EDT Ventricular Rate:  81 PR Interval:  148 QRS Duration:  72 QT Interval:  406 QTC Calculation: 471 R Axis:   60  Text Interpretation: Normal sinus rhythm Cannot rule out Anterior infarct , age undetermined Abnormal ECG Confirmed by Francesca Fallow ([id number]) on 02/19/2024 10:19:15 AM  Radiology: [facility] Chest 2 View Result Date: 02/19/2024 CLINICAL DATA:  Bilateral lower leg pain. EXAM: CHEST - 2 VIEW COMPARISON:  February 22, 2023 FINDINGS: The heart size and mediastinal contours are within  normal limits. Both lungs are clear. The visualized skeletal structures are unremarkable. IMPRESSION: No active cardiopulmonary disease. Electronically Signed   By: Suzen Dials M.D.   On: 02/19/2024 09:23     Procedures   Medications Ordered in the ED  Chlorhexidine  Gluconate Cloth 2 % PADS 6 each (has no administration in time range)  furosemide (LASIX) 160 mg in dextrose  5 % 50 mL IVPB (0 mg Intravenous Stopped 02/19/24 1034)  dextrose  50 % solution 50 mL (50 mLs Intravenous Given 02/19/24 1220)  fentaNYL  (SUBLIMAZE ) injection 50 mcg (50 mcg Intravenous Given 02/19/24 1253)  Clinical Course as of 02/19/24 1506  Sat Feb 19, 2024  0830 Nephrology to evaluate patient at bedside, no other acute recommendations at this time [CP]    Clinical Course User Index [CP] Rosan Sherlean DEL, PA-C                                Medical Decision Making Amount and/or Complexity of Data Reviewed Radiology: ordered.  Risk Prescription drug management.   This patient presents to the ED for concern of bilateral pedal edema, this involves an extensive number of treatment options, and is a complaint that carries with it a high risk of complications and morbidity.   Differential diagnosis includes: New onset heart failure, kidney failure, chronic venous insufficiency, medication induced edema, hypoalbuminemia, lymphedema, DVT, AKI with fluid overload  Co morbidities:  history of type 1 diabetes on peritoneal dialysis    Additional history:  Patient reported to the emergency department yesterday for evaluation of the same complaint, however left due to long wait time.  However, labs were drawn.  BNP was 1538.  Mild leukocytosis, likely in the setting of recent peritonitis.  Lab Tests:  Did not feel the need to repeat labs today.  Imaging Studies:  I ordered imaging studies including chest x-ray I independently visualized and interpreted imaging which showed no acute  cardiopulmonary process I agree with the radiologist interpretation  Cardiac Monitoring/ECG:  The patient was maintained on a cardiac monitor.  I personally viewed and interpreted the cardiac monitored which showed an underlying rhythm of: Sinus  Medicines ordered and prescription drug management:  I ordered medication including  Medications  Chlorhexidine  Gluconate Cloth 2 % PADS 6 each (has no administration in time range)  furosemide (LASIX) 160 mg in dextrose  5 % 50 mL IVPB (0 mg Intravenous Stopped 02/19/24 1034)  dextrose  50 % solution 50 mL (50 mLs Intravenous Given 02/19/24 1220)  fentaNYL  (SUBLIMAZE ) injection 50 mcg (50 mcg Intravenous Given 02/19/24 1253)   for fluid overload Reevaluation of the patient after these medicines showed that the patient improved I have reviewed the patients home medicines and have made adjustments as needed  Test Considered:   none  Critical Interventions:   multiple consultations - Emergent temporary hemodialysis port placed by critical care  Consultations Obtained: - Nephrology - PCCM - Hospitalist  Problem List / ED Course:     ICD-10-CM   1. ESRD on dialysis (HCC)  N18.6    Z99.2       MDM: 38 year old female who presents emergency department for evaluation of bilateral pedal edema.  Patient has type 1 diabetes and is on peritoneal dialysis and has not missed a session.  She reported increased pedal edema and leg pain starting yesterday.  Patient does make small amounts of urine at this time.  She reported to the emergency department last night 10/24 but left due to long wait time.  Labs were drawn at that time and BNP was 1538.  Patient's physical exam reveals 3+ bilateral pedal edema without pain.  Lung sounds are clear, however chest x-ray ordered to evaluate for possible fluid overload in the lungs.  Consult to nephrology placed.  Nephrology recommended giving patient 160 mg of IV Lasix to encourage urine  output.  11:30am Per RN, patient still does not feel the need to urinate.  However, patient tried to urinate and had approximately 40 cc of output.  1:40pm RN informed me that patient  is worried about her blood sugars at this time.  RN spoke with nephrologist, who wishes to keep patient n.p.o. due to possible hemodialysis port placement.  I ordered an amp of D50 for blood sugar maintenance.  Repeat CBG shows 149.  Patient reports she is in pain, so I ordered fentanyl .  I placed a consult to critical care for temporary hemodialysis port per nephrology recommendation.  Nephrology stated that patient will need a permanent port placement, but IR only has 1 radiologist at this time.  I spoke with Benton Lesches, NP on the critical care team who stated her team would come down to the emergency department and place the temporary port.  At this time, 3:06 PM the port has not yet been placed.  Nephrology, Dr. Dennise has been primary contact for this patient's care.  After temporary hemodialysis port placed, Dr. Dennise had recommendation for patient to be admitted until Tuesday or whenever patient can have permanent hemodialysis port placed.  I consulted the hospitalist.  Dr. Claudene was the admitting provider, who will admit this patient to his service.  Dispostion:  After consideration of the diagnostic results and the patients response to treatment, I feel that the patient would benefit from inpatient admission and hemodialysis port placement.   Final diagnoses:  ESRD on dialysis [facility])    ED Discharge Orders     None          Torrence Marry RAMAN, PA-C 02/19/24 1506    Francesca Elsie CROME, MD 02/20/24 [phone]

## 2024-02-20 DIAGNOSIS — T85691A Other mechanical complication of intraperitoneal dialysis catheter, initial encounter: Secondary | ICD-10-CM | POA: Diagnosis not present

## 2024-02-20 LAB — RENAL FUNCTION PANEL
Albumin: 2.2 g/dL — ABNORMAL LOW (ref 3.5–5.0)
Anion gap: 11 (ref 5–15)
BUN: 28 mg/dL — ABNORMAL HIGH (ref 6–20)
CO2: 28 mmol/L (ref 22–32)
Calcium: 6.9 mg/dL — ABNORMAL LOW (ref 8.9–10.3)
Chloride: 94 mmol/L — ABNORMAL LOW (ref 98–111)
Creatinine, Ser: 6.61 mg/dL — ABNORMAL HIGH (ref 0.44–1.00)
GFR, Estimated: 8 mL/min — ABNORMAL LOW (ref >60)
Glucose, Bld: 211 mg/dL — ABNORMAL HIGH (ref 70–99)
Phosphorus: 4.1 mg/dL (ref 2.5–4.6)
Potassium: 4.3 mmol/L (ref 3.5–5.1)
Sodium: 133 mmol/L — ABNORMAL LOW (ref 135–145)

## 2024-02-20 LAB — CBC WITH DIFFERENTIAL/PLATELET
Abs Immature Granulocytes: 0.07 K/uL (ref 0.00–0.07)
Basophils Absolute: 0 K/uL (ref 0.0–0.1)
Basophils Relative: 0 %
Eosinophils Absolute: 0 K/uL (ref 0.0–0.5)
Eosinophils Relative: 0 %
HCT: 28.1 % — ABNORMAL LOW (ref 36.0–46.0)
Hemoglobin: 9.4 g/dL — ABNORMAL LOW (ref 12.0–15.0)
Immature Granulocytes: 1 %
Lymphocytes Relative: 3 %
Lymphs Abs: 0.4 K/uL — ABNORMAL LOW (ref 0.7–4.0)
MCH: 28.4 pg (ref 26.0–34.0)
MCHC: 33.5 g/dL (ref 30.0–36.0)
MCV: 84.9 fL (ref 80.0–100.0)
Monocytes Absolute: 0.1 K/uL (ref 0.1–1.0)
Monocytes Relative: 1 %
Neutro Abs: 12.3 K/uL — ABNORMAL HIGH (ref 1.7–7.7)
Neutrophils Relative %: 95 %
Platelets: 283 K/uL (ref 150–400)
RBC: 3.31 MIL/uL — ABNORMAL LOW (ref 3.87–5.11)
RDW: 15.3 % (ref 11.5–15.5)
WBC: 12.8 K/uL — ABNORMAL HIGH (ref 4.0–10.5)
nRBC: 0 % (ref 0.0–0.2)

## 2024-02-20 LAB — GLUCOSE, CAPILLARY: Glucose-Capillary: 127 mg/dL — ABNORMAL HIGH (ref 70–99)

## 2024-02-20 MED ORDER — FENTANYL CITRATE (PF) 50 MCG/ML IJ SOSY
25.0000 ug | PREFILLED_SYRINGE | INTRAMUSCULAR | Status: DC | PRN
Start: 2024-02-20 — End: 2024-02-20

## 2024-02-20 MED ORDER — FENTANYL CITRATE (PF) 50 MCG/ML IJ SOLN
25.0000 ug | INTRAMUSCULAR | Status: DC | PRN
Start: 2024-02-20 — End: 2024-02-20

## 2024-02-20 MED ORDER — CYCLOBENZAPRINE HCL 10 MG PO TABS
10.0000 mg | ORAL_TABLET | Freq: Three times a day (TID) | ORAL | Status: DC | PRN
  Administered 2024-02-20 – 2024-02-22 (×5): 10 mg via ORAL
  Filled 2024-02-20 (×5): qty 1

## 2024-02-20 MED ORDER — CEFAZOLIN SODIUM-DEXTROSE 2-4 GM/100ML-% IV SOLN: 2.0000 g | INTRAVENOUS | Status: AC

## 2024-02-20 MED ORDER — MELATONIN 3 MG PO TABS
3.0000 mg | ORAL_TABLET | Freq: Every evening | ORAL | Status: DC | PRN
  Administered 2024-02-20 – 2024-02-21 (×2): 3 mg via ORAL
  Filled 2024-02-20 (×2): qty 1

## 2024-02-20 NOTE — Progress Notes (Signed)
 [facility] Progress Note    Assessment/ Plan:   Coag negative peritonitis - PD catheter now malfunctioned. Supposed to get her last Vancomycin  dose night prior to admission. She reports that her catheter has been relatively working well, and she would like to keep this in for now since she is asymptomatic. Can hold off on PD catheter removal for now and revisit as an outpatient. Cell count/gram stain pending. AXR from 10/24 report pending. Will need to hold off on PD consideration for about 6-8 weeks in the interim. ESRD - on PD but PD catheter has malfunction although reports of functioning well just retaining fluid which leads me to believe there is PD failure in the context of peritonitis. Transitioned to HD via temp line placement (emergent) 10/25--tolerated well, will consult IR for [facility] placement (NPO after midnight). She REALLY does not want to do in-center therefore recommend clipping to TCU which she is agreeable with. HD planned for tomorrow Hypertension/volume- BPs were up 2nd volume overload. Improved, UF as tolerated Anemia of CKD - Hgb 9.4, monitor trend Secondary Hyperparathyroidism -  po4 wnl Nutrition - Renal diet with fluid restriction Dispo - TDC hopefully tomorrow, clip to tcu if feasible, will need PD cath flushes @ [facility] until decision made in regards to removal  Dialysis Orders:  [facility] Primary Nephrologist: Dr. Gearline 5 Exchanges Fill volume: Dwell: 2 hrs EDW 72kg    Subjective:   Patient seen and examined bedside. She reports feeling SO much better. SOB and edema resolved, no abdominal pain/distension   Objective:   BP (!) 176/97 (BP Location: Right Arm)   Pulse 83   Temp 98 F (36.7 C) (Oral)   Resp 19   Ht 5' 7 (1.702 m)   Wt 83 kg   SpO2 100%   BMI 28.66 kg/m   Intake/Output Summary (Last 24 hours) at 02/20/2024 0957 Last data filed at 02/19/2024 2305 Gross per 24 hour  Intake 66.08 ml  Output 3030 ml   Net -2963.92 ml   Weight change:   Physical Exam: Gen: NAD CVS: RRR Resp: normal wob, unlabored Abd: soft, NT/ND, PD cath c/d/i Ext: no edema Dialysis access: RIJ TEMP HD CATH  Imaging: DG CHEST PORT 1 VIEW Result Date: 02/19/2024 CLINICAL DATA:  CENTRAL LINE PLACEMENT. EXAM: PORTABLE CHEST 1 VIEW COMPARISON:  02/19/2024. FINDINGS: The heart is enlarged and the mediastinal contour is stable. Atherosclerotic calcification the aorta is noted. A right internal jugular central venous catheter terminates over the right atrium. Lung volumes are low. No consolidation, effusion, or pneumothorax is seen. No acute osseous abnormality. IMPRESSION: Right internal jugular central venous catheter terminates over the right atrium. No pneumothorax is seen. Electronically Signed   By: Leita Birmingham M.D.   On: 02/19/2024 16:31   DG Chest 2 View Result Date: 02/19/2024 CLINICAL DATA:  Bilateral lower leg pain. EXAM: CHEST - 2 VIEW COMPARISON:  February 22, 2023 FINDINGS: The heart size and mediastinal contours are within normal limits. Both lungs are clear. The visualized skeletal structures are unremarkable. IMPRESSION: No active cardiopulmonary disease. Electronically Signed   By: Suzen Dials M.D.   On: 02/19/2024 09:23    Labs: BMET Recent Labs  Lab 02/18/24 2315 02/20/24 0214  NA 136 133*  K 3.2* 4.3  CL 96* 94*  CO2 24 28  GLUCOSE 88 211*  BUN 66* 28*  CREATININE 11.66* 6.61*  CALCIUM  6.5* 6.9*  PHOS  --  4.1   CBC Recent Labs  Lab 02/18/24 2315 02/20/24 0214  WBC 12.1* 12.8*  NEUTROABS  --  12.3*  HGB 9.6* 9.4*  HCT 30.3* 28.1*  MCV 88.1 84.9  PLT 357 283    Medications:     amLODipine   10 mg Oral Daily   calcitRIOL  0.25 mcg Oral Daily   Chlorhexidine  Gluconate Cloth  6 each Topical Q0600   heparin   5,000 Units Subcutaneous Q8H   insulin  pump   Subcutaneous TID WC, HS, 0200   metoprolol  succinate  100 mg Oral Daily   predniSONE   40 mg Oral Daily   sodium  chloride flush  3 mL Intravenous Q12H      Ephriam Stank, MD [facility] 02/20/2024, 9:57 AM

## 2024-02-20 NOTE — Consult Note (Signed)
 Patient Status: [name] - In-pt  Assessment and Plan: Peritoneal catheter malfunction Patient in need of tunneled HD catheter.  Patient agreeable to proceed with placement.  Currently has a temp cath in place.   NPO p MN for possible placement tomorrow as schedule allows. 2g Ancef  ordered--- to be given in IR.   Risks and benefits discussed with the patient including, but not limited to bleeding, infection, vascular injury, pneumothorax which may require chest tube placement, air embolism or even death  All of the patient's questions were answered, patient is agreeable to proceed. Consent signed and in IR.    ______________________________________________________________________   History of Present Illness: Kelly Huff is a 38 y.o. female with DM1 and renal failure on HD via peritoneal catheter with home exchanges.  Unfortunately she has developed malfunction of her PD port and presented to ED for urgent HD with volume overload.  Temp HD cath placed by CCM APP at bedside.  Now in need of tunneled HD catheter for discharge.   Allergies and medications reviewed.   Review of Systems: A 12 point ROS discussed and pertinent positives are indicated in the HPI above.  All other systems are negative.  Review of Systems  Constitutional:  Negative for fatigue and fever.  Respiratory:  Negative for cough and shortness of breath.   Cardiovascular:  Negative for chest pain.  Gastrointestinal:  Negative for nausea and vomiting.  Musculoskeletal:  Negative for back pain.  Psychiatric/Behavioral:  Negative for behavioral problems and confusion.     Vital Signs: BP (!) 176/97 (BP Location: Right Arm)   Pulse 83   Temp 98 F (36.7 C) (Oral)   Resp 19   Ht 5' 7 (1.702 m)   Wt 182 lb 15.7 oz (83 kg)   SpO2 100%   BMI 28.66 kg/m   Physical Exam Vitals and nursing note reviewed.  Constitutional:      General: She is not in acute distress.    Appearance: Normal appearance. She is  not ill-appearing.  HENT:     Mouth/Throat:     Mouth: Mucous membranes are moist.     Pharynx: Oropharynx is clear.  Neck:     Comments: R internal jugular temp cath in place Cardiovascular:     Rate and Rhythm: Normal rate and regular rhythm.  Pulmonary:     Effort: Pulmonary effort is normal. No respiratory distress.     Breath sounds: Normal breath sounds.  Musculoskeletal:        General: Normal range of motion.  Skin:    General: Skin is warm and dry.  Neurological:     General: No focal deficit present.     Mental Status: She is alert and oriented to person, place, and time. Mental status is at baseline.      Imaging reviewed.   Labs:  COAGS: No results for input(s): INR, APTT in the last 8760 hours.  BMP: Recent Labs    11/02/23 1328 02/01/24 1258 02/18/24 2315 02/20/24 0214  NA 137 131* 136 133*  K 3.9 4.2 3.2* 4.3  CL 97* 94* 96* 94*  CO2 27 23 24 28   GLUCOSE 130* 311* 88 211*  BUN 40* 43* 66* 28*  CALCIUM  7.4* 6.7* 6.5* 6.9*  CREATININE 13.86* 11.54* 11.66* 6.61*  GFRNONAA 3* 4* 4* 8*       Electronically Signed: Solmon Selmer Ku, PA 02/20/2024, 10:37 AM   I spent a total of 15 minutes in face to face in clinical  consultation, greater than 50% of which was counseling/coordinating care for venous access.

## 2024-02-20 NOTE — Plan of Care (Signed)

## 2024-02-20 NOTE — Progress Notes (Signed)
 Progress Note   Patient: Kelly Huff FMW:[id number] DOB: [birth year] DOA: 02/19/2024     1 DOS: the patient was seen and examined on 02/20/2024   Brief hospital course: The patient is a 38 yr old woman who presented to [facility] ED on 02/19/2024 with a complaint of fluid retention particularly in her legs for the past week. She has been doing her peritoneal dialysis, but it has not been able to remove sufficient fluid. Although her volume is down in the morning, it will accumulate again during the day.   The patient has a history of recurrent PD associated peritonitis, ESRD on home PD, hypertension, and DM Type 1.   The patient states that she has been unable to remove the full amount of fluid prescribed. She feels that this has led to the feeling of heaviness in her legs.   In the ED the patient was found to have a BP of 210/106. WBC was 12.1, hgb was 9.6, potassium was 3.2, Creatinine was 11.66. Calcium  was 6.5, and anion gap was 16.   The patient was admitted to a telemetry bed. Potassium was supplemented. Nephrology was consulted. IR was consulted to place another PD catheter, but was unable to do so. PCCM was consulted to place a temporary (emergent) dialysis catheter. This will occur on 02/20/2024. The patient underwent HD. Plan is for placement of tunneled catheter on 02/21/2024.  Assessment and Plan: Mechanical complication of peritoneal dialysis catheter Fluid overload ESRD on PD  Nephrology was consulted. IR was consulted to place another PD catheter, but was unable to do so. PCCM was consulted to place a temporary (emergent) dialysis catheter. This will occur on 02/20/2024. The patient underwent HD. Plan is for placement of tunneled catheter on 02/21/2024.    Hypertensive urgency Blood pressures elevated up to 210/106.  Patient reported not taking blood pressure medications today. - Resume metoprolol  and amlodipine  - Hydralazine  IV as needed Blood pressure improved after emergent  HD.   History of recurrent peritoneal dialysis peritonitis Patient had just completed course with IV vancomycin  for peritonitis. - Check peritoneal fluid culture - Peritonitis with coag negative staph.Nephrology recommends holding off on removal of PD catheter and revisit the issue as outpatient so long as the patient remains asymptomatic.   Leukocytosis Acute.  WBC elevated at 12.8 which appears similar to prior. 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  02/16/24 infusion pushed back 4 weeks due to peritonitis infection. - Continue prednisone  - Continue outpatient follow-up   Diabetes mellitus type 1 on insulin  pump Patient followed by Dr. Faythe in the outpatient setting.  Last hemoglobin A1c noted to be 6.1.  Patient request to keep on insulin  pump at this time despite having a low 69 for which she received Abbott dextrose  - Hypoglycemia protocols - Amp of D50 as needed for low blood sugars - Insulin  pump order set utilized - Glucoses have been running primarily in the mid 110's to 195 with once low glucose late on 10/25.   Anemia of chronic kidney disease Hemoglobin 9.6 which appears around patient's baseline. - Continue to monitor   DVT prophylaxis: Heparin  Advance Care Planning:   Code Status: Full Code    Consults: PCCM, nephrology      Subjective: The patient is resting comfortably. No new complaints.  Physical Exam: Vitals:   02/20/24 0700 02/20/24 1343 02/20/24 1555 02/20/24 1603  BP: ROLLEN)  176/97 (!) 169/94 (!) 187/90   Pulse: 83 86 91   Resp: 19 20 18    Temp: 98 F (36.7 C) 98.3 F (36.8 C) 97.8 F (36.6 C)   TempSrc: Oral Oral Oral   SpO2: 100% 100% 100%   Weight:    87.1 kg  Height:    5' 7 (1.702 m)   Exam:  Constitutional:  The patient is awake, alert, and oriented x 3. No acute distress. Respiratory:  No increased work of  breathing. No wheezes, rales, or rhonchi No tactile fremitus Cardiovascular:  Regular rate and rhythm No murmurs, ectopy, or gallups. No lateral PMI. No thrills. Abdomen:  Abdomen is soft, non-tender, non-distended No hernias, masses, or organomegaly Normoactive bowel sounds.  Musculoskeletal:  No cyanosis, clubbing, or edema Skin:  No rashes, lesions, ulcers palpation of skin: no induration or nodules Neurologic:  CN 2-12 intact Sensation all 4 extremities intact Psychiatric:  Mental status Mood, affect appropriate Orientation to person, place, time  judgment and insight appear intact  Data Reviewed:  CBC,BMP  Family Communication: None available.  Disposition: Status is: Inpatient Remains inpatient appropriate because: Need for tunneled catheter placement with the initiation of HD.  Planned Discharge Destination: Home    Time spent: 38 minutes  Author: Kavin Weckwerth, DO 02/20/2024 7:07 PM  For on call review [email or web address].

## 2024-02-21 ENCOUNTER — Inpatient Hospital Stay (HOSPITAL_COMMUNITY)

## 2024-02-21 DIAGNOSIS — T85691D Other mechanical complication of intraperitoneal dialysis catheter, subsequent encounter: Secondary | ICD-10-CM

## 2024-02-21 HISTORY — PX: IR TUNNELED CENTRAL VENOUS CATH PLC W IMG: IMG1939

## 2024-02-21 LAB — GLUCOSE, CAPILLARY
Glucose-Capillary: 62 mg/dL — ABNORMAL LOW (ref 70–99)
Glucose-Capillary: 124 mg/dL — ABNORMAL HIGH (ref 70–99)
Glucose-Capillary: 60 mg/dL — ABNORMAL LOW (ref 70–99)

## 2024-02-21 LAB — CALCIUM, IONIZED: Calcium, Ionized, Serum: 4 mg/dL — ABNORMAL LOW (ref 4.5–5.6)

## 2024-02-21 LAB — BASIC METABOLIC PANEL WITH GFR
Anion gap: 14 (ref 5–15)
BUN: 54 mg/dL — ABNORMAL HIGH (ref 6–20)
CO2: 25 mmol/L (ref 22–32)
Calcium: 6.3 mg/dL — CL (ref 8.9–10.3)
Chloride: 97 mmol/L — ABNORMAL LOW (ref 98–111)
Creatinine, Ser: 8.77 mg/dL — ABNORMAL HIGH (ref 0.44–1.00)
GFR, Estimated: 5 mL/min — ABNORMAL LOW (ref >60)
Glucose, Bld: 283 mg/dL — ABNORMAL HIGH (ref 70–99)
Potassium: 4.1 mmol/L (ref 3.5–5.1)
Sodium: 136 mmol/L (ref 135–145)

## 2024-02-21 MED ORDER — FENTANYL CITRATE (PF) 100 MCG/2ML IJ SOLN
INTRAMUSCULAR | Status: AC | PRN
  Administered 2024-02-21 (×2): 25 ug via INTRAVENOUS
  Administered 2024-02-21: 50 ug via INTRAVENOUS

## 2024-02-21 MED ORDER — HEPARIN SODIUM (PORCINE) 1000 UNIT/ML IJ SOLN
INTRAMUSCULAR | Status: AC
  Filled 2024-02-21: qty 10

## 2024-02-21 MED ORDER — CEFAZOLIN SODIUM-DEXTROSE 2-4 GM/100ML-% IV SOLN
INTRAVENOUS | Status: AC | PRN
  Administered 2024-02-21: 2 g via INTRAVENOUS

## 2024-02-21 MED ORDER — FENTANYL CITRATE (PF) 100 MCG/2ML IJ SOLN
INTRAMUSCULAR | Status: AC
  Filled 2024-02-21: qty 2

## 2024-02-21 MED ORDER — OXYCODONE HCL 5 MG PO TABS
5.0000 mg | ORAL_TABLET | Freq: Four times a day (QID) | ORAL | Status: DC | PRN
  Administered 2024-02-21 (×2): 5 mg via ORAL
  Filled 2024-02-21: qty 1

## 2024-02-21 MED ORDER — OXYCODONE HCL 5 MG PO TABS
ORAL_TABLET | ORAL | Status: AC
  Filled 2024-02-21: qty 1

## 2024-02-21 MED ORDER — CALCIUM GLUCONATE-NACL 1-0.675 GM/50ML-% IV SOLN
1.0000 g | Freq: Once | INTRAVENOUS | Status: AC
  Administered 2024-02-21: 1000 mg via INTRAVENOUS
  Filled 2024-02-21: qty 50

## 2024-02-21 MED ORDER — HEPARIN SODIUM (PORCINE) 1000 UNIT/ML IJ SOLN
INTRAMUSCULAR | Status: AC
  Filled 2024-02-21: qty 6

## 2024-02-21 MED ORDER — MIDAZOLAM HCL 2 MG/2ML IJ SOLN
INTRAMUSCULAR | Status: AC
  Filled 2024-02-21: qty 2

## 2024-02-21 MED ORDER — LIDOCAINE-EPINEPHRINE 1 %-1:100000 IJ SOLN
INTRAMUSCULAR | Status: AC
  Filled 2024-02-21: qty 1

## 2024-02-21 MED ORDER — CEFAZOLIN SODIUM-DEXTROSE 2-4 GM/100ML-% IV SOLN
INTRAVENOUS | Status: AC
  Filled 2024-02-21: qty 100

## 2024-02-21 MED ORDER — LORAZEPAM 2 MG/ML IJ SOLN
0.5000 mg | Freq: Once | INTRAMUSCULAR | Status: AC | PRN
  Administered 2024-02-21: 0.5 mg via INTRAVENOUS
  Filled 2024-02-21: qty 1

## 2024-02-21 MED ORDER — MIDAZOLAM HCL (PF) 2 MG/2ML IJ SOLN
INTRAMUSCULAR | Status: AC | PRN
  Administered 2024-02-21 (×2): 1 mg via INTRAVENOUS

## 2024-02-21 MED ORDER — LIDOCAINE-EPINEPHRINE 1 %-1:100000 IJ SOLN
20.0000 mL | Freq: Once | INTRAMUSCULAR | Status: AC
  Administered 2024-02-21: 18 mL via INTRADERMAL

## 2024-02-21 MED ORDER — IOHEXOL 300 MG/ML  SOLN
50.0000 mL | Freq: Once | INTRAMUSCULAR | Status: AC | PRN
  Administered 2024-02-21: 10 mL via INTRAVENOUS

## 2024-02-21 NOTE — Progress Notes (Signed)
 Pt cbg 60-See Chart review. Pt stated she was going to drink Dr. Nunzio to help bring up bg. Pt refusing to be rechecked by our equipment, when asked what her personal glucose reader said she stated it was over 70 when RN was in room soon after to administer meds due. Pt was provided with apple juice and crackers as well. Dr. Soledad notified of issue. Will continue to monitor as pt allows. Loray Akard S Talyia Allende

## 2024-02-21 NOTE — Plan of Care (Signed)
  Problem: Health Behavior/Discharge Planning: Goal: Ability to manage health-related needs will improve Outcome: Progressing   Problem: Clinical Measurements: Goal: Will remain free from infection Outcome: Progressing   Problem: Nutrition: Goal: Adequate nutrition will be maintained Outcome: Progressing   Problem: Elimination: Goal: Will not experience complications related to bowel motility Outcome: Progressing   Problem: Pain Managment: Goal: General experience of comfort will improve and/or be controlled Outcome: Progressing   Problem: Safety: Goal: Ability to remain free from injury will improve Outcome: Progressing   Problem: Skin Integrity: Goal: Risk for impaired skin integrity will decrease Outcome: Progressing   Problem: Clinical Measurements: Goal: Respiratory complications will improve Outcome: Not Progressing Goal: Cardiovascular complication will be avoided Outcome: Not Progressing   Problem: Activity: Goal: Risk for activity intolerance will decrease Outcome: Not Progressing   Problem: Coping: Goal: Level of anxiety will decrease Outcome: Not Progressing

## 2024-02-21 NOTE — Procedures (Signed)
  Procedure:  TUnneled R internal jugular HD catheter placement  Palindrome 19 Preprocedure diagnosis: The encounter diagnosis was ESRD on dialysis [facility]). Postprocedure diagnosis: same EBL:    minimal Complications:   none immediate  See full dictation in [employer].  CHARM Toribio Faes MD Main # [phone] Pager  [phone] Mobile [phone]

## 2024-02-21 NOTE — Progress Notes (Signed)
 Pt receiving PD care through [facility] home therapy dept prior to admission. Chart reviewed and noted possible need for TCU at d/c. Contacted TCU at [facility] RN to discuss pt's needs at d/c. Navigator advised that pt will begin home HD training at [facility] on Thursday at 8:30 am. [facility] home therapy staff have spoken to pt to make pt aware of these arrangements. Pt will also receive flushes to PD cath at [facility] at d/c. Update provided to nephrologist and renal PA. Will assist as needed.   Randine Mungo Dialysis Navigator [phone]

## 2024-02-21 NOTE — Progress Notes (Signed)
 Cbg 62-see MAR and chart review. Will continue to monitor. Grae Leathers S Glendell Fouse

## 2024-02-21 NOTE — Progress Notes (Signed)
 3 liters ultrafiltration, condition stable, pain medication administered for incisional pain, and report was given to the Primary RN.

## 2024-02-21 NOTE — Progress Notes (Signed)
 Progress Note   Patient: Kelly Huff FMW:[id number] DOB: [birth year] DOA: 02/19/2024     2 DOS: the patient was seen and examined on 02/21/2024   Brief hospital course: The patient is a 38 yr old woman who presented to [facility] ED on 02/19/2024 with a complaint of fluid retention particularly in her legs for the past week. She has been doing her peritoneal dialysis, but it has not been able to remove sufficient fluid. Although her volume is down in the morning, it will accumulate again during the day.   The patient has a history of recurrent PD associated peritonitis, ESRD on home PD, hypertension, and DM Type 1.   The patient states that she has been unable to remove the full amount of fluid prescribed. She feels that this has led to the feeling of heaviness in her legs.   In the ED the patient was found to have a BP of 210/106. WBC was 12.1, hgb was 9.6, potassium was 3.2, Creatinine was 11.66. Calcium  was 6.5, and anion gap was 16.   The patient was admitted to a telemetry bed. Potassium was supplemented. Nephrology was consulted. IR was consulted to place another PD catheter, but was unable to do so. PCCM was consulted to place a temporary (emergent) dialysis catheter. This will occur on 02/20/2024. The patient underwent HD. Plan is for placement of tunneled catheter on 02/21/2024.  Assessment and Plan: Mechanical complication of peritoneal dialysis catheter Fluid overload ESRD on PD  Nephrology was consulted. IR was consulted to place another PD catheter, but was unable to do so. PCCM was consulted to place a temporary (emergent) dialysis catheter. This will occur on 02/20/2024. The patient underwent HD. Plan is for placement of tunneled catheter on 02/21/2024.    Hypertensive urgency Blood pressures elevated up to 210/106.  Patient reported not taking blood pressure medications today. - Resume metoprolol  and amlodipine  - Hydralazine  IV as needed Blood pressure improved after emergent  HD.   History of recurrent peritoneal dialysis peritonitis Patient had just completed course with IV vancomycin  for peritonitis. - Check peritoneal fluid culture - Peritonitis with coag negative staph.Nephrology recommends holding off on removal of PD catheter and revisit the issue as outpatient so long as the patient remains asymptomatic.   Leukocytosis Acute.  WBC elevated at 12.8 which appears similar to prior. 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  02/16/24 infusion pushed back 4 weeks due to peritonitis infection. - Continue prednisone  - Continue outpatient follow-up   Diabetes mellitus type 1 on insulin  pump Patient followed by Dr. Faythe in the outpatient setting.  Last hemoglobin A1c noted to be 6.1.  Patient request to keep on insulin  pump at this time despite having a low 69 for which she received Abbott dextrose  - Hypoglycemia protocols - Amp of D50 as needed for low blood sugars - Insulin  pump order set utilized - Glucoses have been running primarily in the mid 110's to 195 with once low glucose late on 10/25.   Anemia of chronic kidney disease Hemoglobin 9.6 which appears around patient's baseline. - Continue to monitor   DVT prophylaxis: Heparin  Advance Care Planning:   Code Status: Full Code    Consults: PCCM, nephrology    {Tip this will not be part of the note when signed Body mass index is 30.07 kg/m. , ,  (Optional):26781}  Subjective: The patient is resting  comfortably. No new complaints.  Physical Exam: Vitals:   02/21/24 1700 02/21/24 1727 02/21/24 1728 02/21/24 1824  BP: (!) 166/87 (!) 174/82 (!) 160/84 (!) 170/80  Pulse: 90 94 94 99  Resp: (!) 23 15 12 18   Temp:   98.7 F (37.1 C) 98.7 F (37.1 C)  TempSrc:      SpO2: 98% 100% 100% 99%  Weight:      Height:       Exam:  Constitutional:  The patient is awake, alert, and  oriented x 3. No acute distress. Respiratory:  No increased work of breathing. No wheezes, rales, or rhonchi No tactile fremitus Cardiovascular:  Regular rate and rhythm No murmurs, ectopy, or gallups. No lateral PMI. No thrills. Abdomen:  Abdomen is soft, non-tender, non-distended No hernias, masses, or organomegaly Normoactive bowel sounds.  Musculoskeletal:  No cyanosis, clubbing, or edema Skin:  No rashes, lesions, ulcers palpation of skin: no induration or nodules Neurologic:  CN 2-12 intact Sensation all 4 extremities intact Psychiatric:  Mental status Mood, affect appropriate Orientation to person, place, time  judgment and insight appear intact  Data Reviewed:  CBC,BMP  Family Communication: None available.  Disposition: Status is: Inpatient Remains inpatient appropriate because: Need for tunneled catheter placement with the initiation of HD.  Planned Discharge Destination: Home {Tip this will not be part of the note when signed  DVT Prophylaxis  ., Heparin  injection 5,000 units  (Optional):26781}   Time spent: 38 minutes  Author: Candi Profit, DO 02/21/2024 7:03 PM  For on call review [email or web address].

## 2024-02-21 NOTE — Progress Notes (Signed)
 [facility] Progress Note   Subjective:   Seen in room s/p TDC insertion. Reports she is sleepy. Denies SOB, CP, dizziness.   Objective Vitals:   02/21/24 0925 02/21/24 0930 02/21/24 0935 02/21/24 0950  BP: (!) 180/92 (!) 170/87 (!) 182/91 (!) 182/79  Pulse: 88 92 100 97  Resp: 19 19 16 17   Temp:      TempSrc:      SpO2: 97% 98% 96% 97%  Weight:      Height:       Physical Exam General: Heart: Lungs: Abdomen: Extremities: Dialysis Access:   Additional Objective Labs: Basic Metabolic Panel: Recent Labs  Lab 02/18/24 2315 02/20/24 0214 02/21/24 0423  NA 136 133* 136  K 3.2* 4.3 4.1  CL 96* 94* 97*  CO2 24 28 25   GLUCOSE 88 211* 283*  BUN 66* 28* 54*  CREATININE 11.66* 6.61* 8.77*  CALCIUM  6.5* 6.9* 6.3*  PHOS  --  4.1  --    Liver Function Tests: Recent Labs  Lab 02/20/24 0214  ALBUMIN  2.2*   No results for input(s): LIPASE, AMYLASE in the last 168 hours. CBC: Recent Labs  Lab 02/18/24 2315 02/20/24 0214  WBC 12.1* 12.8*  NEUTROABS  --  12.3*  HGB 9.6* 9.4*  HCT 30.3* 28.1*  MCV 88.1 84.9  PLT 357 283   Blood Culture    Component Value Date/Time   SDES CATH TIP 02/22/2023 0844   SPECREQUEST  02/22/2023 0844    NONE Performed at [facility], [address], [place], [place] [address]    CULT >=100,000 COLONIES/mL STAPHYLOCOCCUS EPIDERMIDIS (A) 02/22/2023 0844   REPTSTATUS 02/25/2023 FINAL 02/22/2023 0844    Cardiac Enzymes: No results for input(s): CKTOTAL, CKMB, CKMBINDEX, TROPONINI in the last 168 hours. CBG: Recent Labs  Lab 02/19/24 1338 02/19/24 1840 02/20/24 1349 02/21/24 0820 02/21/24 0858  GLUCAP 149* 223* 127* 62* 124*   Iron  Studies: No results for input(s): IRON , TIBC, TRANSFERRIN, FERRITIN in the last 72 hours. @lablastinr3 @ Studies/Results: DG CHEST PORT 1 VIEW Result Date: 02/21/2024 CLINICAL DATA:  Shortness of breath EXAM: PORTABLE CHEST 1 VIEW COMPARISON:  Chest x-ray  performed February 19, 2024 FINDINGS: A right-sided approach central catheter is present with tip likely terminating within the right atrium. Low lung volumes. Interstitial airspace opacities. No pneumothorax. Trace pleural effusions suspected. IMPRESSION: 1. Central venous catheter terminates in the right atrium. 2. Low lung volumes with central congestion/mild interstitial edema. 3. Suspected trace pleural effusions. Electronically Signed   By: Maude Naegeli M.D.   On: 02/21/2024 07:45   DG CHEST PORT 1 VIEW Result Date: 02/19/2024 CLINICAL DATA:  CENTRAL LINE PLACEMENT. EXAM: PORTABLE CHEST 1 VIEW COMPARISON:  02/19/2024. FINDINGS: The heart is enlarged and the mediastinal contour is stable. Atherosclerotic calcification the aorta is noted. A right internal jugular central venous catheter terminates over the right atrium. Lung volumes are low. No consolidation, effusion, or pneumothorax is seen. No acute osseous abnormality. IMPRESSION: Right internal jugular central venous catheter terminates over the right atrium. No pneumothorax is seen. Electronically Signed   By: Leita Birmingham M.D.   On: 02/19/2024 16:31   Medications:   ceFAZolin  (ANCEF ) IV      amLODipine   10 mg Oral Daily   calcitRIOL  0.25 mcg Oral Daily   Chlorhexidine  Gluconate Cloth  6 each Topical Q0600   heparin   5,000 Units Subcutaneous Q8H   insulin  pump   Subcutaneous TID WC, HS, 0200   metoprolol  succinate  100 mg Oral Daily   predniSONE   40 mg Oral Daily   sodium chloride  flush  3 mL Intravenous Q12H    Dialysis Orders: [facility] Primary Nephrologist: Dr. Gearline 5 Exchanges Fill volume: Dwell: 2 hrs EDW 72kg    Assessment/Plan: Coag negative peritonitis - PD catheter now malfunctioned. Supposed to get her last Vancomycin  dose night prior to admission. She reports that her catheter has been relatively working well, and she would like to keep this in for now since she is asymptomatic. Can hold  off on PD catheter removal for now and revisit as an outpatient. Cell count/gram stain pending. Will need to hold off on PD consideration for about 6-8 weeks in the interim. ESRD - on PD but PD catheter has malfunction although reports of functioning well just retaining fluid which leads me to believe there is PD failure in the context of peritonitis. Transitioned to HD via temp line placement (emergent) 10/25--tolerated well, now with [facility] and next HD today. She REALLY does not want to do in-center therefore recommend clipping to TCU which she is agreeable with. HD planned for tomorrow Hypertension/volume- BPs were up 2nd volume overload. Improved, UF as tolerated Anemia of CKD - Hgb 9.4, monitor trend Secondary Hyperparathyroidism -  po4 wnl, corrected calcium  running slightly low, calcitriol resumed here Nutrition - Renal diet with fluid restriction Dispo -  clip to tcu if feasible, will need PD cath flushes @ [facility] until decision made in regards to removal    Lucie Collet, PA-C 02/21/2024, 10:30 AM  [facility] Pager: [phone]

## 2024-02-21 NOTE — Inpatient Diabetes Management (Signed)
 Inpatient Diabetes Program Recommendations  AACE/ADA: New Consensus Statement on Inpatient Glycemic Control (2015)  Target Ranges:  Prepandial:   less than 140 mg/dL      Peak postprandial:   less than 180 mg/dL (1-2 hours)      Critically ill patients:  140 - 180 mg/dL   Lab Results  Component Value Date   GLUCAP 60 (L) 02/21/2024   HGBA1C 6.8 (H) 03/21/2020    Review of Glycemic Control  Diabetes history: DM1 Outpatient Diabetes medications: OmniPod 5 with Humalog  and Dexcom G7 Current orders for Inpatient glycemic control: Insulin  pump  HgbA1C - 6.8%  Insulin  pump settings:  Time Basal I:C CF Target  12 am 0.8 1:15 -- 20 1:40 -- 50 110 -- 120  8 am 0.7 1:15 -- 20 1:40 -- 50 110 -- 120  8 pm 0.7 1:15 -- 20 1:35 -- 50 110 -- 120   CBGs - 283, 62, 124, 60  Inpatient Diabetes Program Recommendations:    Hypoglycemia with insulin  pump.   Will need further downward titration per [facility] Endocrinology.  If hypos continue, recommend removing insulin  pump and starting SQ insulin   Off pump plan per Endocrinology:  Lantus  10 units daily Novolog  0-6 TID Novolog  0-5 units TID for meal coverage if eating > 50%. Allow pt to count carbohydrates and dose per pt 1:15 Insulin  to CHO ratio.  Pt in HD at time of rounding. Will f/u in am.   Thank you. Shona Brandy, RD, LDN, CDCES Inpatient Diabetes Coordinator [phone]

## 2024-02-21 NOTE — Progress Notes (Signed)
   02/21/24 0447  Provider Notification  Provider Name/Title Dr. Alfornia  Date Provider Notified 02/21/24  Time Provider Notified [phone]  Method of Notification Page  Notification Reason Requested by patient/family  Provider response See new orders  Date of Provider Response 02/21/24   Patient called this nurse to her room.  She is c/o feeling very anxious like an anxiety attack.  She is requesting Ativan  IV.  Also, she is c/o right arm swelling that started over the last couple of hours.  The arm is larger than the left.  The skin feels tight.  The pulses are +2.  Skin is warm, dry, and equal in both upper extremities.  No numbness or tingling.  VS stable.  I elevated the right estremity on 2 pillows and wrapped a warm blanket around it for comfort.  Dr. Alfornia made aware.  Order received for IV Ativan  0.5 and was given.  Dr. Alfornia asked that I pass off to the next shift the right arm swelling.  I then received a critical calcium  of 6.3.  Dr. Alfornia made aware.  Received order for Calcium  Gluconate but now her left PIV is sluggish.  IV Team paged for new IV start.  Patient then c/o of shortness of breath.  BS are decreased and she is tachypneic 25 to 30 BPM.  O2 is 95 - 99 on Room Air.  She is also falling asleep while I am taking the VS.  Dr. Alfornia made aware.  Suzen Ice RN

## 2024-02-22 DIAGNOSIS — T85691D Other mechanical complication of intraperitoneal dialysis catheter, subsequent encounter: Secondary | ICD-10-CM | POA: Diagnosis not present

## 2024-02-22 LAB — CBC
HCT: 27.8 % — ABNORMAL LOW (ref 36.0–46.0)
Hemoglobin: 9 g/dL — ABNORMAL LOW (ref 12.0–15.0)
MCH: 27.9 pg (ref 26.0–34.0)
MCHC: 32.4 g/dL (ref 30.0–36.0)
MCV: 86.1 fL (ref 80.0–100.0)
Platelets: 237 K/uL (ref 150–400)
RBC: 3.23 MIL/uL — ABNORMAL LOW (ref 3.87–5.11)
RDW: 15.5 % (ref 11.5–15.5)
WBC: 8.2 K/uL (ref 4.0–10.5)
nRBC: 0 % (ref 0.0–0.2)

## 2024-02-22 LAB — BASIC METABOLIC PANEL WITH GFR
Anion gap: 10 (ref 5–15)
BUN: 30 mg/dL — ABNORMAL HIGH (ref 6–20)
CO2: 27 mmol/L (ref 22–32)
Calcium: 7 mg/dL — ABNORMAL LOW (ref 8.9–10.3)
Chloride: 96 mmol/L — ABNORMAL LOW (ref 98–111)
Creatinine, Ser: 5.99 mg/dL — ABNORMAL HIGH (ref 0.44–1.00)
GFR, Estimated: 9 mL/min — ABNORMAL LOW (ref >60)
Glucose, Bld: 123 mg/dL — ABNORMAL HIGH (ref 70–99)
Potassium: 4.1 mmol/L (ref 3.5–5.1)
Sodium: 133 mmol/L — ABNORMAL LOW (ref 135–145)

## 2024-02-22 LAB — HEPATITIS B SURFACE ANTIBODY, QUANTITATIVE
Hep B S AB Quant (Post): 84301 m[IU]/mL
Hep B S AB Quant (Post): 84257 m[IU]/mL

## 2024-02-22 LAB — BODY FLUID CELL COUNT WITH DIFFERENTIAL
Eos, Fluid: 3 %
Lymphs, Fluid: 34 %
Monocyte-Macrophage-Serous Fluid: 53 % (ref 50–90)
Neutrophil Count, Fluid: 9 % (ref 0–25)
Other Cells, Fluid: 1 %
Total Nucleated Cell Count, Fluid: 180 uL (ref 0–1000)

## 2024-02-22 NOTE — Progress Notes (Signed)
 [facility] Progress Note   Subjective:   Had HD yesterday with 3L UF. Asking if she can go home today. Denies SOB, CP, dizziness, nausea.   Objective Vitals:   02/21/24 1824 02/21/24 1958 02/22/24 0509 02/22/24 0822  BP: (!) 170/80 (!) 155/78 (!) 154/75 (!) 162/94  Pulse: 99 96 89 89  Resp: 18 18 19 18   Temp: 98.7 F (37.1 C) 98.7 F (37.1 C) 98.6 F (37 C) 98.2 F (36.8 C)  TempSrc:      SpO2: 99% 97% 100% 100%  Weight:      Height:       Physical Exam General: Alert female in NAD Heart: RRR, no murmurs Lungs: CTA bilaterally, respirations unlabored  Abdomen: Soft, non-distended, PD cath in abdomen Extremities: No edema b/l lower extremities Dialysis Access: [facility]  Additional Objective Labs: Basic Metabolic Panel: Recent Labs  Lab 02/20/24 0214 02/21/24 0423 02/22/24 0417  NA 133* 136 133*  K 4.3 4.1 4.1  CL 94* 97* 96*  CO2 28 25 27   GLUCOSE 211* 283* 123*  BUN 28* 54* 30*  CREATININE 6.61* 8.77* 5.99*  CALCIUM  6.9* 6.3* 7.0*  PHOS 4.1  --   --    Liver Function Tests: Recent Labs  Lab 02/20/24 0214  ALBUMIN  2.2*   No results for input(s): LIPASE, AMYLASE in the last 168 hours. CBC: Recent Labs  Lab 02/18/24 2315 02/20/24 0214  WBC 12.1* 12.8*  NEUTROABS  --  12.3*  HGB 9.6* 9.4*  HCT 30.3* 28.1*  MCV 88.1 84.9  PLT 357 283   Blood Culture    Component Value Date/Time   SDES CATH TIP 02/22/2023 0844   SPECREQUEST  02/22/2023 0844    NONE Performed at [facility], [address], [place], [place] [address]    CULT >=100,000 COLONIES/mL STAPHYLOCOCCUS EPIDERMIDIS (A) 02/22/2023 0844   REPTSTATUS 02/25/2023 FINAL 02/22/2023 0844    Cardiac Enzymes: No results for input(s): CKTOTAL, CKMB, CKMBINDEX, TROPONINI in the last 168 hours. CBG: Recent Labs  Lab 02/19/24 1840 02/20/24 1349 02/21/24 0820 02/21/24 0858 02/21/24 1044  GLUCAP 223* 127* 62* 124* 60*   Iron  Studies: No results for input(s):  IRON , TIBC, TRANSFERRIN, FERRITIN in the last 72 hours. @lablastinr3 @ Studies/Results: IR TUNNELED CENTRAL VENOUS CATH [facility] W IMG Result Date: 02/21/2024 EXAM: ULTRASOUND GUIDED VASCULAR ACCESS. FLUOROSCOPY GUIDED PLACEMENT OF A TUNNELED CATHETER. MODERATE CONSCIOUS SEDATION 02/21/2024 09:36:00 AM SEDATION: 2 mg versed , 100 mcg fentanyl , 16 minutes FLUOROSCOPY DOSE AND TYPE: Radiation Exposure Index: Reference Air Kerma (in mGy) = 35.2 TECHNIQUE: Informed consent was obtained after a detailed explanation of the procedure including risks, benefits, and alternatives. All aspects of maximum sterile barrier technique were used including washing hands with conventional soap and water  or with alcohol-based hand rubs (ABHR), skin preparation, cap, mask, sterile gown, sterile gloves, and sterile full body drape. Local anesthesia was achieved with lidocaine . A micropuncture needle was used to access the right internal jugular vein using ultrasound guidance. An ultrasound image demonstrating patency of the vein with needle tip located within it was obtained and stored in PACS. A 0.035 guidewire was used to place a peel-away sheath. A subcutaneous tunnel was created to the infraclavicular region and a tunneled Palindrome 19 cm hemodialysis catheter was pulled through the subcutaneous tunnel to the venotomy site and advanced through the peel-away sheath under fluoroscopic guidance to the right atrium. The catheter flushed easily and there was a good blood return. The catheter was sutured to the  skin. The catheter was locked with heparinized saline. The patient tolerated the procedure well and there were no immediate complications. COMPARISON: None available. CLINICAL HISTORY: renal failure, needs long-term access for hemodialysis. FINDINGS: Fluoroscopic image demonstrates the tip of the Palindrome 19 cm hemodialysis catheter in the proximal right atrium. IMPRESSION: 1. Successful ultrasound- and fluoroscopy-guided  placement of a tunneled 19 cm Palindrome hemodialysis catheter with the tip in the proximal right atrium. Electronically signed by: Dayne Hassell MD 02/21/2024 11:31 AM EDT RP Workstation: [id number]   DG CHEST PORT 1 VIEW Result Date: 02/21/2024 CLINICAL DATA:  Shortness of breath EXAM: PORTABLE CHEST 1 VIEW COMPARISON:  Chest x-ray performed February 19, 2024 FINDINGS: A right-sided approach central catheter is present with tip likely terminating within the right atrium. Low lung volumes. Interstitial airspace opacities. No pneumothorax. Trace pleural effusions suspected. IMPRESSION: 1. Central venous catheter terminates in the right atrium. 2. Low lung volumes with central congestion/mild interstitial edema. 3. Suspected trace pleural effusions. Electronically Signed   By: Maude Naegeli M.D.   On: 02/21/2024 07:45   Medications:   amLODipine   10 mg Oral Daily   calcitRIOL  0.25 mcg Oral Daily   Chlorhexidine  Gluconate Cloth  6 each Topical Q0600   heparin   5,000 Units Subcutaneous Q8H   insulin  pump   Subcutaneous TID WC, HS, 0200   metoprolol  succinate  100 mg Oral Daily   predniSONE   40 mg Oral Daily   sodium chloride  flush  3 mL Intravenous Q12H    Dialysis Orders: [facility] Primary Nephrologist: Dr. Gearline 5 Exchanges Fill volume: Dwell: 2 hrs EDW 72kg  Assessment/Plan: Coag negative peritonitis - PD catheter now malfunctioned. Supposed to get her last Vancomycin  dose night prior to admission. She reports that her catheter has been relatively working well, and she would like to keep this in for now since she is asymptomatic. Can hold off on PD catheter removal for now and revisit as an outpatient. Cell count/gram stain pending- asked RN to collect today. Will need to hold off on PD consideration for about 6-8 weeks in the interim. ESRD - on PD but PD catheter has malfunction although reports of functioning well just retaining fluid which leads me to believe  there is PD failure in the context of peritonitis. Transitioned to HD via temp line placement (emergent) 10/25--tolerated well, now with [facility], will be starting at [facility] on Thursday.  Hypertension/volume- BPs were up 2nd volume overload. Improved, UF as tolerated Anemia of CKD - Hgb 9.4, monitor trend Secondary Hyperparathyroidism -  po4 wnl, corrected calcium  running slightly low, calcitriol resumed here Nutrition - Renal diet with fluid restriction Dispo -  clip to tcu, will need PD cath flushes @ [facility] until decision made in regards to removal      Lucie Collet, PA-C 02/22/2024, 9:30 AM  [facility] Pager: [phone]

## 2024-02-22 NOTE — Progress Notes (Signed)
 cbg call for low CBG. Pt.. Pt refusing to be rechecked by our equipment, when asked what her personal glucose reader said she stated it was over 70.She asked for apple juices . Pt was provided with apple juice and crackers as well.. This nurse check pt. If reached done CBG was 95 as per pt.equipment.

## 2024-02-22 NOTE — Progress Notes (Signed)
 DISCHARGE NOTE HOME FELMA PFEFFERLE to be discharged Home per MD order. Discussed prescriptions and follow up appointments with the patient. Prescriptions given to patient; medication list explained in detail. Patient verbalized understanding.  Skin clean, dry and intact without evidence of skin break down, no evidence of skin tears noted. IV catheter discontinued intact. Site without signs and symptoms of complications. Dressing and pressure applied. Pt denies pain at the site currently. No complaints noted.  Patient free of lines, drains, and wounds.   An After Visit Summary (AVS) was printed and given to the patient. Patient escorted via wheelchair, and discharged home via private auto.  Doyal Sias, RN

## 2024-02-22 NOTE — Care Management Important Message (Signed)
 Important Message  Patient Details  Name: Kelly Huff MRN: [id number] Date of Birth: [birth year]   Important Message Given:  Yes - Medicare IM     Claretta Deed 02/22/2024, 1:32 PM

## 2024-02-22 NOTE — Progress Notes (Signed)
 D/C order noted. Contacted pt's home therapy RN at [facility] to be advised pt will d/c today and should start home HD training on Thursday as planned. Thursday's appt added to pt's AVS yesterday and home therapy staff at clinic spoke to pt yesterday regarding arrangements. Home therapy staff to assist with flushing pt's PD cath if pt uncomfortable doing it herself.   Randine Mungo Dialysis Navigator [phone]

## 2024-02-22 NOTE — TOC Transition Note (Signed)
 Transition of Care [facility]) - Discharge Note   Patient Details  Name: Kelly Huff MRN: [id number] Date of Birth: March 09, 1986  Transition of Care [facility]) CM/SW Contact:  Tom-Johnson, Harvest Muskrat, RN Phone Number: 02/22/2024, 3:09 PM   Clinical Narrative:     Patient is scheduled for discharge today.  Readmission Risk Assessment done. Outpatient f/u, hospital f/u and discharge instructions on AVS. No ICM needs or recommendations noted. Husband, Lonni at bedside will transport at discharge.  No further ICM needs noted.     Final next level of care: Home/Self Care Barriers to Discharge: Barriers Resolved   Patient Goals and CMS Choice Patient states their goals for this hospitalization and ongoing recovery are:: To return home CMS Medicare.gov Compare Post Acute Care list provided to:: Patient Choice offered to / list presented to : NA      Discharge Placement                Patient to be transferred to facility by: Husband Name of family member notified: [facility]    Discharge Plan and Services Additional resources added to the After Visit Summary for                  DME Arranged: N/A DME Agency: NA       HH Arranged: NA HH Agency: NA        Social Drivers of Health (SDOH) Interventions SDOH Screenings   Food Insecurity: No Food Insecurity (02/19/2024)  Housing: Low Risk  (02/19/2024)  Transportation Needs: No Transportation Needs (02/19/2024)  Utilities: Not At Risk (02/19/2024)  Depression (PHQ2-9): Low Risk  (02/01/2024)  Financial Resource Strain: Low Risk  (10/06/2022)   Received from [facility]  Tobacco Use: Low Risk  (02/19/2024)     Readmission Risk Interventions    02/22/2024    1:53 PM 01/07/2023    5:01 PM  Readmission Risk Prevention Plan  Transportation Screening Complete Complete  PCP or Specialist Appt within 5-7 Days  Complete  PCP or Specialist Appt within 3-5 Days Complete   Home Care  Screening  Complete  Medication Review (RN CM)  Referral to Pharmacy  HRI or Home Care Consult Complete   Social Work Consult for Recovery Care Planning/Counseling Complete   Palliative Care Screening Not Applicable   Medication Review [facility]) Referral to Pharmacy

## 2024-02-22 NOTE — TOC CM/SW Note (Signed)
 Transition of Care [facility]) - Inpatient Brief Assessment   Patient Details  Name: Kelly Huff MRN: [id number] Date of Birth: Sep 17, 1985  Transition of Care [facility]) CM/SW Contact:    Kelly Huff, Kelly Muskrat, RN Phone Number: 02/22/2024, 1:53 PM   Clinical Narrative:  Patient presented to the ED with Abdominal distension, swelling to bilateral Legs and hands. Patient has hx of  HTN, T1DM, ESRD on Peritoneal Dialysis at home. Admitted with PD Cath Mechanical Complication/Peritonitis. Patient transitioned to Hemodialysis, now with [facility] and started IHD. Nephrology following. Completed IV abx.  Patient declined outpatient HD, plan to do at home HD. Patient to start training for home HD on Thursday 02/24/24 at [facility]. Renal Navigator following.    CM spoke with patient at bedside about needs for post hospital transition. Patient states she lives with her husband, Kelly Huff and two children. Both parents and five siblings are supportive. Independent with care, on disability. Does not have DME's at home.  PCP is Kelly Norris, MD and uses [facility] [address]  No ICM needs or recommendations noted at this time.  Patient not Medically ready for discharge.  CM will continue to follow as patient progresses with care towards discharge              Transition of Care Asessment: Insurance and Status: Insurance coverage has been reviewed Patient has primary care physician: Yes Home environment has been reviewed: Yes Prior level of function:: Independent Prior/Current Home Services: No current home services Social Drivers of Health Review: SDOH reviewed no interventions necessary Readmission risk has been reviewed: Yes

## 2024-02-23 ENCOUNTER — Telehealth: Payer: Self-pay | Admitting: Physician Assistant

## 2024-02-23 LAB — PATHOLOGIST SMEAR REVIEW

## 2024-02-23 NOTE — Discharge Summary (Signed)
 Physician Discharge Summary   Patient: Kelly Huff MRN: [id number] DOB: [birth year]  Admit date:     02/19/2024  Discharge date: 02/22/2024  Discharge Physician: Brigida Bureau   PCP: Gearline Norris, MD   Recommendations at discharge:    Discharge to home Follow up with outpatient HD for training for home HD. Follow up with nephrology as directed.  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 [facility] Course: The patient is a 38 yr old woman who presented to [facility] ED on 02/19/2024 with a complaint of fluid retention particularly in her legs for the past week. She has been doing her peritoneal dialysis, but it has not been able to remove sufficient fluid. Although her volume is down in the morning, it will accumulate again during the day.    The patient has a history of recurrent PD associated peritonitis, ESRD on home PD, hypertension, and DM Type 1.    The patient states that she has been unable to remove the full amount of fluid prescribed. She feels that this has led to the feeling of heaviness in her legs.    In the ED the patient was found to have a BP of 210/106. WBC was 12.1, hgb was 9.6, potassium was 3.2, Creatinine was 11.66. Calcium  was 6.5, and anion gap was 16.    The patient was admitted to a telemetry bed. Potassium was supplemented. Nephrology was consulted. IR was consulted to place another PD catheter, but was unable to do so. PCCM was consulted to place a temporary (emergent) dialysis catheter. This will occur on 02/20/2024. The patient underwent HD.    The patient had a tunneled  catheter placed on 02/21/2024. She is planning on transitioning from PD to HD at home and will be educated in outpatient HD center.    She underwent HD on 02/21/2024 with removal of 3L. She had an episode of hypoglycemia following her catheter placement due to NPO status for the procedure. Diet was restarted.   On 10/28 the patient was cleared for discharge once arrangements had been made for her home dialysis training to begin next Thursday at [facility]. Due to coagulase negative peritonitis consideration for continuation of PD cannot take place for another 6-8 weeks.  Assessment and Plan: Mechanical complication of peritoneal dialysis catheter Fluid overload ESRD on PD  Nephrology was consulted. IR was consulted to place another PD catheter, but was unable to do so. PCCM was consulted to place a temporary (emergent) dialysis catheter. This will occur on 02/20/2024. The patient underwent HD. Tunneled catheter was placed on 02/21/2024.The patient had 3L in volume removed in HD following that procedure, PD will have to be suspended for 6-8 weeks. ESRD at home in the meantime.    Hypertensive urgency Blood pressures elevated up to 210/106 initially. Resolved over the past 24 hours.    - She is receiving metoprolol  and amlodipine  as well as as needed hydralazine . - Blood pressure improved after HD and removal of volume   History of recurrent peritoneal dialysis peritonitis Patient had just completed course with IV vancomycin  for peritonitis. - Check peritoneal fluid culture -  Peritonitis with coag negative staph.Nephrology recommends holding off on removal of PD catheter and revisit the issue as outpatient so long as the patient remains asymptomatic. - PD suspended for 6-8 weeks.   Leukocytosis Acute.  WBC elevated at 12.8 which appears similar to prior. -To some degree due to prednisone  - Continue to monitor   Hemolytic uremic syndrome Patient on Ultimoris infusions every 12 weeks and is  followed by [facility].  02/16/24 infusion pushed back 4 weeks due to peritonitis infection. - Continue prednisone  - Continue outpatient follow-up   Diabetes mellitus type 1 on insulin  pump Patient followed by Dr. Faythe in the outpatient setting.  Last hemoglobin A1c noted to be 6.1.  Patient request to keep on insulin  pump at this time despite having a low 69 for which she received Abbott dextrose  - Hypoglycemia protocols - Amp of D50 as needed for low blood sugars - Insulin  pump order set utilized - Hypoglycemia on 02/21/2024 due to her NPO status prior to catheter placement.   Anemia of chronic kidney disease    DVT prophylaxis: Heparin  Advance Care Planning:   Code Status: Full Code    Consults: PCCM, nephrology  Procedures performed: Placement of temporary dialysis catheter.  Placement of TDC HD  Disposition: Home Diet recommendation:  Discharge Diet Orders (From admission, onward)     Start     Ordered   02/22/24 0000  Diet - low sodium heart healthy        02/22/24 1457           Renal diet DISCHARGE MEDICATION: Allergies as of 02/22/2024       Reactions   Morphine Itching, Other (See Comments)   Hallucinations, also Has tolerated percocet per patient and based on fill hx    Peanut Oil Swelling, Other (See Comments)   Tongue swelling as a child Can eat peanuts    Sulfamethoxazole-trimethoprim Hives, Other (See Comments)   Covid-19 (mrna) Vaccine Other (See Comments)   Was told to not take this again   Trulicity [dulaglutide] Other (See Comments)   She is DM Type 1. Was prescribed but damaged her kidneys        Medication List     TAKE these medications    amLODipine  10 MG tablet Commonly known as: NORVASC  Take 10 mg by mouth.   Auryxia 1 GM 210 MG(Fe) tablet Generic drug: ferric citrate Take 630 mg by mouth See admin instructions. Take 630 mg by mouth three times a day with meals and 210 mg with each snack   calcitRIOL 0.25 MCG capsule Commonly  known as: ROCALTROL Take 0.25 mcg by mouth daily.   COLLAGEN PO Take 1 Scoop by mouth daily. In liquid   cyclobenzaprine  10 MG tablet Commonly known as: FLEXERIL  Take 10 mg by mouth 3 (three) times daily as needed for muscle spasms.   Dexcom G6 Sensor Misc Inject 1 Device into the skin See admin instructions. Place 1 new device into the skin every 10 days   Digestive Enzyme Caps Take 1 capsule by mouth daily.  prebiotics probiotics   Glucagon  3 MG/DOSE Powd Place 3 mg into the nose daily as needed (low glucose).   insulin  lispro 100 UNIT/ML injection Commonly known as: HUMALOG  Continue use of insulin  pump with setting as previous: Restart in the morning of 03/23/2020 as Lantus  was given on the morning of 03/22/2020.   metoprolol  succinate 100 MG 24 hr tablet Commonly known as: TOPROL -XL Take 100 mg by mouth daily.   Omnipod  5 DexG7G6 Pods Gen 5 Misc Inject 1 Device into the skin every 3 (three) days.   traZODone  50 MG tablet Commonly known as: DESYREL  Take 50 mg by mouth at bedtime as needed for sleep.        Follow-up Information     Center, [facility]. Go on 02/24/2024.   Why: Home therapy department- arrive at 8:30 am. Contact information: [address] [place] [address] [phone]                Discharge Exam: [name]   02/19/24 9192 02/20/24 1603 02/21/24 1437  Weight: 83 kg 87.1 kg 87.1 kg   Exam:  Constitutional:  The patient is awake, alert, and oriented x 3. No acute distress. Eyes:  pupils and irises appear normal Normal lids and conjunctivae ENMT:  grossly normal hearing  Lips appear normal external ears, nose appear normal Oropharynx: mucosa, tongue,posterior pharynx appear normal Neck:  neck appears normal, no masses, normal ROM, supple no thyromegaly Respiratory:  No increased work of breathing. No wheezes, rales, or rhonchi No tactile fremitus Cardiovascular:  Regular rate and rhythm No murmurs, ectopy,  or gallups. No lateral PMI. No thrills. Abdomen:  Abdomen is soft, non-tender, non-distended No hernias, masses, or organomegaly Normoactive bowel sounds.  Musculoskeletal:  No cyanosis, clubbing, or edema Skin:  No rashes, lesions, ulcers palpation of skin: no induration or nodules Neurologic:  CN 2-12 intact Sensation all 4 extremities intact Psychiatric:  Mental status Mood, affect appropriate Orientation to person, place, time  judgment and insight appear intact   Condition at discharge: fair  The results of significant diagnostics from this hospitalization (including imaging, microbiology, ancillary and laboratory) are listed below for reference.   Imaging Studies: IR TUNNELED CENTRAL VENOUS CATH [facility] W IMG Result Date: 02/21/2024 EXAM: ULTRASOUND GUIDED VASCULAR ACCESS. FLUOROSCOPY GUIDED PLACEMENT OF A TUNNELED CATHETER. MODERATE CONSCIOUS SEDATION 02/21/2024 09:36:00 AM SEDATION: 2 mg versed , 100 mcg fentanyl , 16 minutes FLUOROSCOPY DOSE AND TYPE: Radiation Exposure Index: Reference Air Kerma (in mGy) = 35.2 TECHNIQUE: Informed consent was obtained after a detailed explanation of the procedure including risks, benefits, and alternatives. All aspects of maximum sterile barrier technique were used including washing hands with conventional soap and water  or with alcohol-based hand rubs (ABHR), skin preparation, cap, mask, sterile gown, sterile gloves, and sterile full body drape. Local anesthesia was achieved with lidocaine . A micropuncture needle was used to access the right internal jugular vein using ultrasound guidance. An ultrasound image demonstrating patency of the vein with needle tip located within it was obtained and stored in PACS. A 0.035 guidewire was used to place a peel-away sheath. A subcutaneous tunnel was created to the infraclavicular region and a tunneled Palindrome 19 cm hemodialysis catheter was pulled through the subcutaneous tunnel to the venotomy site and  advanced through the peel-away sheath under fluoroscopic guidance to the right atrium. The catheter flushed easily and there was a good blood return. The catheter was sutured to the skin. The catheter was locked with heparinized saline. The patient tolerated the procedure well and there were no immediate complications. COMPARISON: None available. CLINICAL HISTORY: renal failure, needs long-term access for hemodialysis. FINDINGS: Fluoroscopic image demonstrates the tip of the Palindrome 19 cm hemodialysis catheter in the proximal right atrium. IMPRESSION: 1. Successful ultrasound- and fluoroscopy-guided placement of a tunneled 19 cm Palindrome hemodialysis catheter with the tip in the proximal right atrium. Electronically signed by: Dayne Hassell MD 02/21/2024 11:31 AM EDT RP Workstation: [id number]   DG  CHEST PORT 1 VIEW Result Date: 02/21/2024 CLINICAL DATA:  Shortness of breath EXAM: PORTABLE CHEST 1 VIEW COMPARISON:  Chest x-ray performed February 19, 2024 FINDINGS: A right-sided approach central catheter is present with tip likely terminating within the right atrium. Low lung volumes. Interstitial airspace opacities. No pneumothorax. Trace pleural effusions suspected. IMPRESSION: 1. Central venous catheter terminates in the right atrium. 2. Low lung volumes with central congestion/mild interstitial edema. 3. Suspected trace pleural effusions. Electronically Signed   By: Maude Naegeli M.D.   On: 02/21/2024 07:45   DG Abd 2 Views Result Date: 02/20/2024 EXAM: 2 VIEW XRAY OF THE ABDOMEN 02/18/2024 04:13:29 PM COMPARISON: 10/15/2023 CLINICAL HISTORY: FINDINGS: LINES, TUBES AND DEVICES: Right lower quadrant Peritoneal dialysis catheter terminates over the left pelvis, without appreciable kink or discontinuity. BOWEL: Moderate colonic stool burden. SOFT TISSUES: Iliofemoral vascular calcifications. No opaque urinary calculi. 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 No acute  osseous abnormality. IMPRESSION: 1. Right lower quadrant peritoneal dialysis catheter terminates over the left pelvis without kink or discontinuity. 2. 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 Electronically signed by: Selinda Blue MD 02/20/2024 05:05 PM EDT RP Workstation: [id number]   DG CHEST PORT 1 VIEW Result Date: 02/19/2024 CLINICAL DATA:  CENTRAL LINE PLACEMENT. EXAM: PORTABLE CHEST 1 VIEW COMPARISON:  02/19/2024. FINDINGS: The heart is enlarged and the mediastinal contour is stable. Atherosclerotic calcification the aorta is noted. A right internal jugular central venous catheter terminates over the right atrium. Lung volumes are low. No consolidation, effusion, or pneumothorax is seen. No acute osseous abnormality. IMPRESSION: Right internal jugular central venous catheter terminates over the right atrium. No pneumothorax is seen. Electronically Signed   By: Leita Birmingham M.D.   On: 02/19/2024 16:31   DG Chest 2 View Result Date: 02/19/2024 CLINICAL DATA:  Bilateral lower leg pain. EXAM: CHEST - 2 VIEW COMPARISON:  February 22, 2023 FINDINGS: The heart size and mediastinal contours are within normal limits. Both lungs are clear. The visualized skeletal structures are unremarkable. IMPRESSION: No active cardiopulmonary disease. Electronically Signed   By: Suzen Dials M.D.   On: 02/19/2024 09:23    Microbiology: Results for orders placed or performed during the hospital encounter of 02/22/23  Cath Tip Culture     Status: Abnormal   Collection Time: 02/22/23  8:44 AM   Specimen: Catheter Tip; Other  Result Value Ref Range Status   Specimen Description CATH TIP  Final   Special Requests   Final    NONE Performed at [facility], [address], [place], [place] [address]    Culture >=100,000 COLONIES/mL STAPHYLOCOCCUS EPIDERMIDIS (A)  Final   Report Status 02/25/2023 FINAL  Final   Organism  ID, Bacteria STAPHYLOCOCCUS EPIDERMIDIS (A)  Final      Susceptibility   Staphylococcus epidermidis - MIC*    CIPROFLOXACIN <=0.5 SENSITIVE Sensitive     ERYTHROMYCIN >=8 RESISTANT Resistant     GENTAMICIN  <=0.5 SENSITIVE Sensitive     OXACILLIN RESISTANT Resistant     TETRACYCLINE 2 SENSITIVE Sensitive     VANCOMYCIN  1 SENSITIVE Sensitive     TRIMETH/SULFA <=10 SENSITIVE Sensitive     CLINDAMYCIN >=8 RESISTANT Resistant     RIFAMPIN  <=0.5 SENSITIVE Sensitive     Inducible Clindamycin NEGATIVE Sensitive     * >=100,000 COLONIES/mL STAPHYLOCOCCUS EPIDERMIDIS    Labs: CBC: Recent Labs  Lab 02/18/24 2315 02/20/24 0214 02/22/24 0948  WBC 12.1* 12.8*  8.2  NEUTROABS  --  12.3*  --   HGB 9.6* 9.4* 9.0*  HCT 30.3* 28.1* 27.8*  MCV 88.1 84.9 86.1  PLT 357 283 237   Basic Metabolic Panel: Recent Labs  Lab 02/18/24 2315 02/20/24 0214 02/21/24 0423 02/22/24 0417  NA 136 133* 136 133*  K 3.2* 4.3 4.1 4.1  CL 96* 94* 97* 96*  CO2 24 28 25 27   GLUCOSE 88 211* 283* 123*  BUN 66* 28* 54* 30*  CREATININE 11.66* 6.61* 8.77* 5.99*  CALCIUM  6.5* 6.9* 6.3* 7.0*  PHOS  --  4.1  --   --    Liver Function Tests: Recent Labs  Lab 02/20/24 0214  ALBUMIN  2.2*   CBG: Recent Labs  Lab 02/19/24 1840 02/20/24 1349 02/21/24 0820 02/21/24 0858 02/21/24 1044  GLUCAP 223* 127* 62* 124* 60*    Discharge time spent: greater than 30 minutes.  Signed: Leron Stoffers, DO [facility] 02/23/2024

## 2024-02-23 NOTE — Telephone Encounter (Signed)
 Transition of care contact from inpatient facility  Date of Discharge: 02/22/24 Date of Contact: 02/23/24 Method of contact: Phone  Attempted to contact patient to discuss transition of care from inpatient admission. Patient did not answer the phone. Message was left on the patient's voicemail with call back instructions. PD cell count from yesterday looks good, total cell count 180. awaiting culture.  Lucie Collet, PA-C 02/23/2024, 12:53 PM  [facility] Pager: [phone]

## 2024-02-24 ENCOUNTER — Telehealth (HOSPITAL_COMMUNITY): Payer: Self-pay | Admitting: *Deleted

## 2024-02-24 ENCOUNTER — Telehealth: Payer: Self-pay

## 2024-02-24 NOTE — Telephone Encounter (Addendum)
 TDC placed by [facility] IR 10/27.  Call placed to [facility] IR for removal of TDC.  **Error - Patient has PD Cath / not TDC.  Will call patient to schedule removal of PD Cath with Dr. Magda.

## 2024-02-24 NOTE — Telephone Encounter (Signed)
 Received fax from Dr Bernardino Gasman requesting removal of PD catheter. Per staff message from [facility], ok to go to surgery scheduling without office appt.  Will notify Alan and scan fax into the media tab.

## 2024-02-26 NOTE — Discharge Summary (Incomplete)
 Physician Discharge Summary   Patient: Kelly Huff MRN: [id number] DOB: 03/12/1986  Admit date:     02/19/2024  Discharge date: 02/22/2024  Discharge Physician: Brigida Bureau   PCP: Gearline Norris, MD   Recommendations at discharge:  {Tip this will not be part of the note when signed- Example include specific recommendations for outpatient follow-up, pending tests to follow-up on. 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 [facility] Course: No notes on file  Assessment and Plan: No notes have been filed under this hospital service. Service: Hospitalist     {Tip this will not be part of the note when signed Body mass index is 30.07 kg/m. , ,  (Optional):26781}  {(NOTE) Pain control PDMP Statment (Optional):26782} Consultants: *** Procedures performed: ***  Disposition: {Plan; Disposition:26390} Diet recommendation:  Discharge Diet Orders (From admission, onward)     Start     Ordered   02/22/24 0000  Diet - low sodium heart healthy        02/22/24 1457           {Diet_Plan:26776} DISCHARGE MEDICATION: Allergies as of 02/22/2024       Reactions   Morphine Itching, Other (See Comments)   Hallucinations, also Has tolerated percocet per patient and based on fill hx    Peanut Oil Swelling, Other (See Comments)   Tongue swelling as a child Can eat peanuts    Sulfamethoxazole-trimethoprim Hives, Other (See Comments)   Covid-19 (mrna) Vaccine Other (See Comments)   Was told to not take this again   Trulicity [dulaglutide] Other (See Comments)   She  is DM Type 1. Was prescribed but damaged her kidneys        Medication List     TAKE these medications    amLODipine  10 MG tablet Commonly known as: NORVASC  Take 10 mg by mouth.   Auryxia 1 GM 210 MG(Fe) tablet Generic drug: ferric citrate Take 630 mg by mouth See admin instructions. Take 630 mg by mouth three times a day with meals and 210 mg with each snack   calcitRIOL 0.25 MCG capsule Commonly known as: ROCALTROL Take 0.25 mcg by mouth daily.   COLLAGEN PO Take 1 Scoop by mouth daily. In liquid   cyclobenzaprine  10 MG tablet Commonly known as: FLEXERIL  Take 10 mg by mouth 3 (three) times daily as needed for muscle spasms.   Dexcom G6 Sensor Misc Inject 1 Device into the skin See admin instructions. Place 1 new device into the skin every 10 days   Digestive Enzyme Caps Take 1 capsule by mouth daily.  prebiotics probiotics   Glucagon  3 MG/DOSE Powd Place 3 mg into the nose daily as needed (low glucose).   insulin  lispro 100 UNIT/ML injection Commonly known as: HUMALOG  Continue use of insulin  pump with setting as previous: Restart in the morning of 03/23/2020 as Lantus  was given on the morning of 03/22/2020.   metoprolol  succinate 100 MG 24 hr tablet Commonly known as: TOPROL -XL Take 100 mg by mouth daily.   Omnipod 5 DexG7G6 Pods Gen 5 Misc Inject 1 Device into the skin every  3 (three) days.   traZODone  50 MG tablet Commonly known as: DESYREL  Take 50 mg by mouth at bedtime as needed for sleep.        Follow-up Information     Center, [facility]. Go on 02/24/2024.   Why: Home therapy department- arrive at 8:30 am. Contact information: [address] [place] [address] [phone]                Discharge Exam: [name]   02/19/24 0807 02/20/24 1603 02/21/24 1437  Weight: 83 kg 87.1 kg 87.1 kg   ***  Condition at discharge: {DC Condition:26389}  The results of significant diagnostics from this hospitalization (including  imaging, microbiology, ancillary and laboratory) are listed below for reference.   Imaging Studies: IR TUNNELED CENTRAL VENOUS CATH [facility] W IMG Result Date: 02/21/2024 EXAM: ULTRASOUND GUIDED VASCULAR ACCESS. FLUOROSCOPY GUIDED PLACEMENT OF A TUNNELED CATHETER. MODERATE CONSCIOUS SEDATION 02/21/2024 09:36:00 AM SEDATION: 2 mg versed , 100 mcg fentanyl , 16 minutes FLUOROSCOPY DOSE AND TYPE: Radiation Exposure Index: Reference Air Kerma (in mGy) = 35.2 TECHNIQUE: Informed consent was obtained after a detailed explanation of the procedure including risks, benefits, and alternatives. All aspects of maximum sterile barrier technique were used including washing hands with conventional soap and water  or with alcohol-based hand rubs (ABHR), skin preparation, cap, mask, sterile gown, sterile gloves, and sterile full body drape. Local anesthesia was achieved with lidocaine . A micropuncture needle was used to access the right internal jugular vein using ultrasound guidance. An ultrasound image demonstrating patency of the vein with needle tip located within it was obtained and stored in PACS. A 0.035 guidewire was used to place a peel-away sheath. A subcutaneous tunnel was created to the infraclavicular region and a tunneled Palindrome 19 cm hemodialysis catheter was pulled through the subcutaneous tunnel to the venotomy site and advanced through the peel-away sheath under fluoroscopic guidance to the right atrium. The catheter flushed easily and there was a good blood return. The catheter was sutured to the skin. The catheter was locked with heparinized saline. The patient tolerated the procedure well and there were no immediate complications. COMPARISON: None available. CLINICAL HISTORY: renal failure, needs long-term access for hemodialysis. FINDINGS: Fluoroscopic image demonstrates the tip of the Palindrome 19 cm hemodialysis catheter in the proximal right atrium. IMPRESSION: 1. Successful ultrasound- and  fluoroscopy-guided placement of a tunneled 19 cm Palindrome hemodialysis catheter with the tip in the proximal right atrium. Electronically signed by: Dayne Hassell MD 02/21/2024 11:31 AM EDT RP Workstation: [id number]   DG CHEST PORT 1 VIEW Result Date: 02/21/2024 CLINICAL DATA:  Shortness of breath EXAM: PORTABLE CHEST 1 VIEW COMPARISON:  Chest x-ray performed February 19, 2024 FINDINGS: A right-sided approach central catheter is present with tip likely terminating within the right atrium. Low lung volumes. Interstitial airspace opacities. No pneumothorax. Trace pleural effusions suspected. IMPRESSION: 1. Central venous catheter terminates in the right atrium. 2. Low lung volumes with central congestion/mild interstitial edema. 3. Suspected trace pleural effusions. Electronically Signed   By: Maude Naegeli M.D.   On: 02/21/2024 07:45   DG Abd 2 Views Result Date: 02/20/2024 EXAM: 2 VIEW XRAY OF THE ABDOMEN 02/18/2024 04:13:29 PM COMPARISON: 10/15/2023 CLINICAL HISTORY: FINDINGS: LINES, TUBES AND DEVICES: Right lower quadrant Peritoneal dialysis catheter terminates over the left pelvis, without appreciable kink or discontinuity. BOWEL: Moderate colonic stool burden. SOFT TISSUES: Iliofemoral vascular calcifications. No opaque urinary calculi. 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 No acute osseous abnormality. IMPRESSION: 1.  Right lower quadrant peritoneal dialysis catheter terminates over the left pelvis without kink or discontinuity. 2. 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 Electronically signed by: Selinda Blue MD 02/20/2024 05:05 PM EDT RP Workstation: [id number]   DG CHEST PORT 1 VIEW Result Date: 02/19/2024 CLINICAL DATA:  CENTRAL LINE PLACEMENT. EXAM: PORTABLE CHEST 1 VIEW COMPARISON:  02/19/2024. FINDINGS: The heart is enlarged and the mediastinal contour is stable.  Atherosclerotic calcification the aorta is noted. A right internal jugular central venous catheter terminates over the right atrium. Lung volumes are low. No consolidation, effusion, or pneumothorax is seen. No acute osseous abnormality. IMPRESSION: Right internal jugular central venous catheter terminates over the right atrium. No pneumothorax is seen. Electronically Signed   By: Leita Birmingham M.D.   On: 02/19/2024 16:31   DG Chest 2 View Result Date: 02/19/2024 CLINICAL DATA:  Bilateral lower leg pain. EXAM: CHEST - 2 VIEW COMPARISON:  February 22, 2023 FINDINGS: The heart size and mediastinal contours are within normal limits. Both lungs are clear. The visualized skeletal structures are unremarkable. IMPRESSION: No active cardiopulmonary disease. Electronically Signed   By: Suzen Dials M.D.   On: 02/19/2024 09:23    Microbiology: Results for orders placed or performed during the hospital encounter of 02/22/23  Cath Tip Culture     Status: Abnormal   Collection Time: 02/22/23  8:44 AM   Specimen: Catheter Tip; Other  Result Value Ref Range Status   Specimen Description CATH TIP  Final   Special Requests   Final    NONE Performed at [facility], [address] [place], [place] [address]    Culture >=100,000 COLONIES/mL STAPHYLOCOCCUS EPIDERMIDIS (A)  Final   Report Status 02/25/2023 FINAL  Final   Organism ID, Bacteria STAPHYLOCOCCUS EPIDERMIDIS (A)  Final      Susceptibility   Staphylococcus epidermidis - MIC*    CIPROFLOXACIN <=0.5 SENSITIVE Sensitive     ERYTHROMYCIN >=8 RESISTANT Resistant     GENTAMICIN  <=0.5 SENSITIVE Sensitive     OXACILLIN RESISTANT Resistant     TETRACYCLINE 2 SENSITIVE Sensitive     VANCOMYCIN  1 SENSITIVE Sensitive     TRIMETH/SULFA <=10 SENSITIVE Sensitive     CLINDAMYCIN >=8 RESISTANT Resistant     RIFAMPIN  <=0.5 SENSITIVE Sensitive     Inducible Clindamycin NEGATIVE Sensitive     * >=100,000 COLONIES/mL STAPHYLOCOCCUS EPIDERMIDIS     Labs: CBC: Recent Labs  Lab 02/20/24 0214 02/22/24 0948  WBC 12.8* 8.2  NEUTROABS 12.3*  --   HGB 9.4* 9.0*  HCT 28.1* 27.8*  MCV 84.9 86.1  PLT 283 237   Basic Metabolic Panel: Recent Labs  Lab 02/20/24 0214 02/21/24 0423 02/22/24 0417  NA 133* 136 133*  K 4.3 4.1 4.1  CL 94* 97* 96*  CO2 28 25 27   GLUCOSE 211* 283* 123*  BUN 28* 54* 30*  CREATININE 6.61* 8.77* 5.99*  CALCIUM  6.9* 6.3* 7.0*  PHOS 4.1  --   --    Liver Function Tests: Recent Labs  Lab 02/20/24 0214  ALBUMIN  2.2*   CBG: Recent Labs  Lab 02/19/24 1840 02/20/24 1349 02/21/24 0820 02/21/24 0858 02/21/24 1044  GLUCAP 223* 127* 62* 124* 60*    Discharge time spent: {LESS THAN/GREATER THAN:26388} 30 minutes.  Signed: Lennart Gladish, DO [facility] 02/26/2024

## 2024-03-01 ENCOUNTER — Encounter (HOSPITAL_COMMUNITY): Payer: Self-pay | Admitting: Internal Medicine

## 2024-03-03 ENCOUNTER — Encounter (HOSPITAL_COMMUNITY)

## 2024-03-16 ENCOUNTER — Telehealth: Payer: Self-pay

## 2024-03-16 ENCOUNTER — Other Ambulatory Visit: Payer: Self-pay

## 2024-03-16 DIAGNOSIS — N186 End stage renal disease: Secondary | ICD-10-CM

## 2024-03-16 NOTE — Telephone Encounter (Signed)
 Attempted to call for surgery scheduling. LVM

## 2024-03-20 ENCOUNTER — Inpatient Hospital Stay (HOSPITAL_COMMUNITY): Admission: RE | Admit: 2024-03-20 | Source: Ambulatory Visit

## 2024-03-20 ENCOUNTER — Ambulatory Visit

## 2024-03-22 ENCOUNTER — Encounter (HOSPITAL_COMMUNITY): Payer: Self-pay | Admitting: Vascular Surgery

## 2024-03-22 ENCOUNTER — Other Ambulatory Visit: Payer: Self-pay

## 2024-03-22 NOTE — Pre-Procedure Instructions (Signed)
 -------------  SDW INSTRUCTIONS given:  Your procedure is scheduled on 12/3.  Report to [facility] Main Entrance A at 09:50 A.M., and check in at the Admitting office.  Any questions or running late day of surgery: call [phone]    Remember:  Do not eat or drink after midnight the night before your surgery     Take these medicines the morning of surgery with A SIP OF WATER   amLODipine  (NORVASC )     May take these medicines IF NEEDED: cyclobenzaprine  (FLEXERIL )    As of today, STOP taking any Aspirin  (unless otherwise instructed by your surgeon) Aleve, Naproxen, Ibuprofen, Motrin, Advil, Goody's, BC's, all herbal medications, fish oil, and all vitamins.  WHAT DO I DO ABOUT MY DIABETES MEDICATION?   REDUCE THE BASAL RATE ON YOUR INSULIN  PUMP BY 20% AT MIDNIGHT THE NIGHT BEFORE SURGERY    HOW TO MANAGE YOUR DIABETES BEFORE AND AFTER SURGERY  Why is it important to control my blood sugar before and after surgery? Improving blood sugar levels before and after surgery helps healing and can limit problems. A way of improving blood sugar control is eating a healthy diet by:  Eating less sugar and carbohydrates  Increasing activity/exercise  Talking with your doctor about reaching your blood sugar goals High blood sugars (greater than 180 mg/dL) can raise your risk of infections and slow your recovery, so you will need to focus on controlling your diabetes during the weeks before surgery. Make sure that the doctor who takes care of your diabetes knows about your planned surgery including the date and location.  How do I manage my blood sugar before surgery? Check your blood sugar at least 4 times a day, starting 2 days before surgery, to make sure that the level is not too high or low.  Check your blood sugar the morning of your surgery when you wake up and every 2 hours until you get to the Short Stay unit.  If your blood sugar is less than 70 mg/dL, you will need to treat  for low blood sugar: Do not take insulin . Treat a low blood sugar (less than 70 mg/dL) with  cup of clear juice (cranberry or apple), 4 glucose tablets, OR glucose gel. Recheck blood sugar in 15 minutes after treatment (to make sure it is greater than 70 mg/dL). If your blood sugar is not greater than 70 mg/dL on recheck, call [phone] for further instructions. Report your blood sugar to the short stay nurse when you get to Short Stay.  If you are admitted to the hospital after surgery: Your blood sugar will be checked by the staff and you will probably be given insulin  after surgery (instead of oral diabetes medicines) to make sure you have good blood sugar levels. The goal for blood sugar control after surgery is 80-180 mg/dL.   Do NOT Smoke (Tobacco/Vaping) 24 hours prior to your procedure  If you use a CPAP at night, you may bring all equipment for your overnight stay.     You will be asked to remove any contacts, glasses, piercing's, hearing aid's, dentures/partials prior to surgery. Please bring cases for these items if needed.     Patients discharged the day of surgery will not be allowed to drive home, and someone needs to stay with them for 24 hours.  SURGICAL WAITING ROOM VISITATION Patients may have no more than 2 support people in the waiting area - these visitors may rotate.   Pre-op  nurse will coordinate an  appropriate time for 1 ADULT support person, who may not rotate, to accompany patient in pre-op .  Children under the age of 37 must have an adult with them who is not the patient and must remain in the main waiting area with an adult.  If the patient needs to stay at the hospital during part of their recovery, the visitor guidelines for inpatient rooms apply.  Please refer to the [facility] website for the visitor guidelines for any additional information.   Special instructions:   - Preparing For Surgery   Please follow these instructions  carefully.   Shower the NIGHT BEFORE SURGERY and the MORNING OF SURGERY with DIAL Soap.   Pat yourself dry with a CLEAN TOWEL.  Wear CLEAN PAJAMAS to bed the night before surgery  Place CLEAN SHEETS on your bed the night of your first shower and DO NOT SLEEP WITH PETS.   Additional instructions for the day of surgery: DO NOT APPLY any lotions, deodorants, cologne, or perfumes.   Do not wear jewelry or makeup Do not wear nail polish, gel polish, artificial nails, or any other type of covering on natural nails (fingers and toes) Do not bring valuables to the hospital. [facility] is not responsible for valuables/personal belongings. Put on clean/comfortable clothes.  Please brush your teeth.  Ask your nurse before applying any prescription medications to the skin.

## 2024-03-22 NOTE — Progress Notes (Signed)
 PCP - Dr. Almarie Bonine (nephrologist) Cardiologist - denies  PPM/ICD - denies   Chest x-ray - 02/21/24 EKG - 02/19/24 Stress Test - 11/19/22 ECHO - 05/31/23 Cardiac Cath - denies  CPAP - denies  Fasting Blood Sugar - 80-130 Checks Blood Sugar continuously (sensor on right arm) DM Coordinator notified   ASA/Blood Thinner Instructions: n/a   ERAS Protcol - no, NPO  COVID TEST- n/a  Anesthesia review: yes, insulin  pump  Patient verbally denies any shortness of breath, fever, cough and chest pain during phone call      Questions were answered. Patient verbalized understanding of instructions.

## 2024-03-27 ENCOUNTER — Telehealth: Payer: Self-pay | Admitting: Internal Medicine

## 2024-03-27 NOTE — Telephone Encounter (Signed)
 Patient called and requested to change her infusion appointment from 11/24 to  12/9. Please update the order for her.  Thank you

## 2024-03-28 ENCOUNTER — Inpatient Hospital Stay (HOSPITAL_COMMUNITY): Admission: RE | Admit: 2024-03-28 | Source: Ambulatory Visit

## 2024-03-28 ENCOUNTER — Other Ambulatory Visit: Payer: Self-pay | Admitting: Internal Medicine

## 2024-03-29 ENCOUNTER — Other Ambulatory Visit: Payer: Self-pay

## 2024-03-29 ENCOUNTER — Ambulatory Visit (HOSPITAL_COMMUNITY)
Admission: RE | Admit: 2024-03-29 | Discharge: 2024-03-29 | Disposition: A | Discharge status: Home / Self Care | Attending: Vascular Surgery | Admitting: Vascular Surgery

## 2024-03-29 ENCOUNTER — Encounter (HOSPITAL_COMMUNITY)
Admission: RE | Disposition: A | Discharge status: Home / Self Care | Payer: Self-pay | Source: Home / Self Care | Attending: Vascular Surgery

## 2024-03-29 ENCOUNTER — Ambulatory Visit (HOSPITAL_COMMUNITY)

## 2024-03-29 ENCOUNTER — Encounter (HOSPITAL_COMMUNITY): Payer: Self-pay | Admitting: Vascular Surgery

## 2024-03-29 DIAGNOSIS — E1122 Type 2 diabetes mellitus with diabetic chronic kidney disease: Secondary | ICD-10-CM

## 2024-03-29 DIAGNOSIS — Z992 Dependence on renal dialysis: Secondary | ICD-10-CM | POA: Diagnosis not present

## 2024-03-29 DIAGNOSIS — N186 End stage renal disease: Secondary | ICD-10-CM | POA: Diagnosis not present

## 2024-03-29 DIAGNOSIS — I120 Hypertensive chronic kidney disease with stage 5 chronic kidney disease or end stage renal disease: Secondary | ICD-10-CM | POA: Diagnosis not present

## 2024-03-29 HISTORY — PX: REMOVAL OF A DIALYSIS CATHETER: SHX6053

## 2024-03-29 LAB — POCT I-STAT, CHEM 8
BUN: 28 mg/dL — ABNORMAL HIGH (ref 6–20)
Calcium, Ion: 0.96 mmol/L — ABNORMAL LOW (ref 1.15–1.40)
Chloride: 96 mmol/L — ABNORMAL LOW (ref 98–111)
Creatinine, Ser: 7.5 mg/dL — ABNORMAL HIGH (ref 0.44–1.00)
Glucose, Bld: 202 mg/dL — ABNORMAL HIGH (ref 70–99)
HCT: 30 % — ABNORMAL LOW (ref 36.0–46.0)
Hemoglobin: 10.2 g/dL — ABNORMAL LOW (ref 12.0–15.0)
Potassium: 3.1 mmol/L — ABNORMAL LOW (ref 3.5–5.1)
Sodium: 135 mmol/L (ref 135–145)
TCO2: 27 mmol/L (ref 22–32)

## 2024-03-29 LAB — GLUCOSE, CAPILLARY
Glucose-Capillary: 214 mg/dL — ABNORMAL HIGH (ref 70–99)
Glucose-Capillary: 196 mg/dL — ABNORMAL HIGH (ref 70–99)
Glucose-Capillary: 102 mg/dL — ABNORMAL HIGH (ref 70–99)

## 2024-03-29 LAB — POCT PREGNANCY, URINE: Preg Test, Ur: NEGATIVE

## 2024-03-29 SURGERY — REMOVAL, DIALYSIS CATHETER
Anesthesia: General

## 2024-03-29 MED ORDER — SCOPOLAMINE 1 MG/3DAYS TD PT72
MEDICATED_PATCH | TRANSDERMAL | Status: DC | PRN
  Administered 2024-03-29: 1 via TRANSDERMAL

## 2024-03-29 MED ORDER — POTASSIUM CHLORIDE 10 MEQ/100ML IV SOLN
INTRAVENOUS | Status: DC | PRN
  Administered 2024-03-29: 10 meq via INTRAVENOUS

## 2024-03-29 MED ORDER — CHLORHEXIDINE GLUCONATE 0.12 % MT SOLN
OROMUCOSAL | Status: AC
  Administered 2024-03-29: 15 mL via OROMUCOSAL
  Filled 2024-03-29: qty 15

## 2024-03-29 MED ORDER — FENTANYL CITRATE (PF) 100 MCG/2ML IJ SOLN
INTRAMUSCULAR | Status: AC
  Filled 2024-03-29: qty 2

## 2024-03-29 MED ORDER — CEFAZOLIN SODIUM-DEXTROSE 2-4 GM/100ML-% IV SOLN
2.0000 g | INTRAVENOUS | Status: AC
  Administered 2024-03-29: 2 g via INTRAVENOUS
  Filled 2024-03-29: qty 100

## 2024-03-29 MED ORDER — DEXMEDETOMIDINE HCL IN NACL 80 MCG/20ML IV SOLN
INTRAVENOUS | Status: DC | PRN
  Administered 2024-03-29: 4 ug via INTRAVENOUS
  Administered 2024-03-29: 8 ug via INTRAVENOUS

## 2024-03-29 MED ORDER — LIDOCAINE 2% (20 MG/ML) 5 ML SYRINGE
INTRAMUSCULAR | Status: AC
  Filled 2024-03-29: qty 5

## 2024-03-29 MED ORDER — OXYCODONE HCL 5 MG PO TABS: 5.0000 mg | ORAL_TABLET | Freq: Once | ORAL | Status: DC | PRN

## 2024-03-29 MED ORDER — ACETAMINOPHEN 10 MG/ML IV SOLN: 1000.0000 mg | Freq: Once | INTRAVENOUS | Status: DC | PRN

## 2024-03-29 MED ORDER — ONDANSETRON HCL 4 MG/2ML IJ SOLN
INTRAMUSCULAR | Status: DC | PRN
  Administered 2024-03-29: 4 mg via INTRAVENOUS

## 2024-03-29 MED ORDER — CHLORHEXIDINE GLUCONATE 0.12 % MT SOLN: 15.0000 mL | Freq: Once | OROMUCOSAL | Status: AC

## 2024-03-29 MED ORDER — ROCURONIUM BROMIDE 10 MG/ML (PF) SYRINGE
PREFILLED_SYRINGE | INTRAVENOUS | Status: AC
  Filled 2024-03-29: qty 10

## 2024-03-29 MED ORDER — PROPOFOL 10 MG/ML IV BOLUS
INTRAVENOUS | Status: AC
  Filled 2024-03-29: qty 20

## 2024-03-29 MED ORDER — SUGAMMADEX SODIUM 200 MG/2ML IV SOLN
INTRAVENOUS | Status: DC | PRN
  Administered 2024-03-29: 200 mg via INTRAVENOUS

## 2024-03-29 MED ORDER — FENTANYL CITRATE (PF) 100 MCG/2ML IJ SOLN
25.0000 ug | INTRAMUSCULAR | Status: DC | PRN
  Administered 2024-03-29: 50 ug via INTRAVENOUS

## 2024-03-29 MED ORDER — ROCURONIUM BROMIDE 10 MG/ML (PF) SYRINGE
PREFILLED_SYRINGE | INTRAVENOUS | Status: DC | PRN
  Administered 2024-03-29: 50 mg via INTRAVENOUS

## 2024-03-29 MED ORDER — 0.9 % SODIUM CHLORIDE (POUR BTL) OPTIME
TOPICAL | Status: DC | PRN
  Administered 2024-03-29: 1000 mL

## 2024-03-29 MED ORDER — MIDAZOLAM HCL 2 MG/2ML IJ SOLN
INTRAMUSCULAR | Status: AC
  Filled 2024-03-29: qty 2

## 2024-03-29 MED ORDER — ORAL CARE MOUTH RINSE: 15.0000 mL | Freq: Once | OROMUCOSAL | Status: AC

## 2024-03-29 MED ORDER — PROPOFOL 500 MG/50ML IV EMUL
INTRAVENOUS | Status: DC | PRN
  Administered 2024-03-29: 150 ug/kg/min via INTRAVENOUS

## 2024-03-29 MED ORDER — CHLORHEXIDINE GLUCONATE 4 % EX SOLN: 60.0000 mL | Freq: Once | CUTANEOUS | Status: DC

## 2024-03-29 MED ORDER — OXYCODONE HCL 5 MG/5ML PO SOLN: 5.0000 mg | Freq: Once | ORAL | Status: DC | PRN

## 2024-03-29 MED ORDER — OXYCODONE-ACETAMINOPHEN 5-325 MG PO TABS: 1.0000 | ORAL_TABLET | Freq: Four times a day (QID) | ORAL | 0 refills | Status: DC | PRN

## 2024-03-29 MED ORDER — MIDAZOLAM HCL (PF) 2 MG/2ML IJ SOLN
INTRAMUSCULAR | Status: DC | PRN
  Administered 2024-03-29 (×2): 1 mg via INTRAVENOUS

## 2024-03-29 MED ORDER — PROPOFOL 10 MG/ML IV BOLUS
INTRAVENOUS | Status: DC | PRN
  Administered 2024-03-29: 70 mg via INTRAVENOUS
  Administered 2024-03-29: 40 mg via INTRAVENOUS
  Administered 2024-03-29: 130 mg via INTRAVENOUS
  Administered 2024-03-29: 40 mg via INTRAVENOUS
  Administered 2024-03-29: 30 mg via INTRAVENOUS

## 2024-03-29 MED ORDER — LIDOCAINE 2% (20 MG/ML) 5 ML SYRINGE
INTRAMUSCULAR | Status: DC | PRN
  Administered 2024-03-29: 60 mg via INTRAVENOUS

## 2024-03-29 MED ORDER — SODIUM CHLORIDE 0.9 % IV SOLN: INTRAVENOUS | Status: DC

## 2024-03-29 MED ORDER — FENTANYL CITRATE (PF) 250 MCG/5ML IJ SOLN
INTRAMUSCULAR | Status: DC | PRN
  Administered 2024-03-29: 100 ug via INTRAVENOUS

## 2024-03-29 MED ORDER — ONDANSETRON HCL 4 MG/2ML IJ SOLN
INTRAMUSCULAR | Status: AC
  Filled 2024-03-29: qty 2

## 2024-03-29 SURGICAL SUPPLY — 36 items
BAG DECANTER FOR FLEXI CONT (MISCELLANEOUS) ×1 IMPLANT
BENZOIN TINCTURE PRP APPL 2/3 (GAUZE/BANDAGES/DRESSINGS) ×1 IMPLANT
BIOPATCH RED 1 DISK 7.0 (GAUZE/BANDAGES/DRESSINGS) ×1 IMPLANT
CATH PALINDROME-P 19CM W/VT (CATHETERS) IMPLANT
CATH PALINDROME-P 28CM W/VT (CATHETERS) IMPLANT
CLSR STERI-STRIP ANTIMIC 1/2X4 (GAUZE/BANDAGES/DRESSINGS) IMPLANT
COVER PROBE W GEL 5X96 (DRAPES) ×1 IMPLANT
COVER SURGICAL LIGHT HANDLE (MISCELLANEOUS) ×1 IMPLANT
DRAPE C-ARM 42X72 X-RAY (DRAPES) ×1 IMPLANT
DRAPE CHEST BREAST 15X10 FENES (DRAPES) ×1 IMPLANT
DRSG TEGADERM 4X10 (GAUZE/BANDAGES/DRESSINGS) IMPLANT
GAUZE 4X4 16PLY ~~LOC~~+RFID DBL (SPONGE) ×1 IMPLANT
GAUZE SPONGE 4X4 12PLY STRL (GAUZE/BANDAGES/DRESSINGS) ×1 IMPLANT
GLOVE BIO SURGEON STRL SZ8 (GLOVE) ×1 IMPLANT
GOWN STRL REUS W/ TWL LRG LVL3 (GOWN DISPOSABLE) ×1 IMPLANT
GOWN STRL REUS W/ TWL XL LVL3 (GOWN DISPOSABLE) ×1 IMPLANT
KIT BASIN OR (CUSTOM PROCEDURE TRAY) ×1 IMPLANT
KIT PALINDROME-P 55CM (CATHETERS) IMPLANT
KIT TURNOVER KIT B (KITS) ×1 IMPLANT
NDL 18GX1X1/2 (RX/OR ONLY) (NEEDLE) ×1 IMPLANT
NDL HYPO 25GX1X1/2 BEV (NEEDLE) ×1 IMPLANT
PACK SRG BSC III STRL LF ECLPS (CUSTOM PROCEDURE TRAY) ×1 IMPLANT
PAD ARMBOARD POSITIONER FOAM (MISCELLANEOUS) ×2 IMPLANT
SOAP 2 % CHG 4 OZ (WOUND CARE) ×1 IMPLANT
SOLN 0.9% NACL POUR BTL 1000ML (IV SOLUTION) ×1 IMPLANT
SOLN STERILE WATER BTL 1000 ML (IV SOLUTION) ×1 IMPLANT
STRIP CLOSURE SKIN 1/2X4 (GAUZE/BANDAGES/DRESSINGS) ×1 IMPLANT
SUT ETHILON 3 0 PS 1 (SUTURE) ×1 IMPLANT
SUT MNCRL AB 4-0 PS2 18 (SUTURE) ×1 IMPLANT
SUT PROLENE 5 0 C 1 24 (SUTURE) IMPLANT
SYR 10ML LL (SYRINGE) ×1 IMPLANT
SYR 20ML LL LF (SYRINGE) ×2 IMPLANT
SYR 5ML LL (SYRINGE) ×1 IMPLANT
SYR CONTROL 10ML LL (SYRINGE) ×1 IMPLANT
TOWEL GREEN STERILE (TOWEL DISPOSABLE) ×1 IMPLANT
TOWEL GREEN STERILE FF (TOWEL DISPOSABLE) ×2 IMPLANT

## 2024-03-29 NOTE — Progress Notes (Signed)
 50 of fentanyl  wasted in appropriate container with Jinnie RN PACU as witness.

## 2024-03-29 NOTE — H&P (Signed)
 [facility] [place]  ASSESSMENT / PLAN: 38 y.o. female with nonfunctional peritoneal dialysis catheter.  Plan removal today in the operating room.  CHIEF COMPLAINT: Nonfunctional peritoneal dialysis catheter  HISTORY OF PRESENT ILLNESS: Kelly Huff is a 38 y.o. female with end-stage renal disease on hemodialysis.  I know her well having revised peritoneal dialysis catheters for her.  Her peritoneal dialysis catheter is no longer functional.  She desires to transition to hemodialysis.  We reviewed the operative conduct of peritoneal dialysis catheter removal.  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   PONV (postoperative nausea and vomiting)     Past Surgical History:  Procedure Laterality Date   CAPD INSERTION N/A 04/26/2023   Procedure: LAPAROSCOPIC INSERTION CONTINUOUS AMBULATORY PERITONEAL DIALYSIS  (CAPD) CATHETER;  Surgeon: Magda Debby SAILOR, MD;  Location: [facility] OR;  Service: Vascular;  Laterality: N/A;   CAPD REMOVAL N/A 02/22/2023   Procedure: REMOVAL CONTINUOUS AMBULATORY PERITONEAL DIALYSIS (CAPD) CATHETER;  Surgeon: Magda Debby SAILOR, MD;  Location: [facility] OR;  Service: Vascular;  Laterality: N/A;   CESAREAN SECTION     x 1   DILATION AND CURETTAGE OF UTERUS     x 4   INSERTION OF DIALYSIS CATHETER Right 02/22/2023   Procedure: INSERTION OF TUNNELED DIALYSIS CATHETER;  Surgeon: Magda Debby SAILOR, MD;  Location: [facility] OR;  Service: Vascular;   Laterality: Right;   IR FLUORO GUIDE CV LINE RIGHT  12/05/2018   IR FLUORO GUIDE CV LINE RIGHT  12/13/2018   IR REMOVAL TUN CV CATH W/O FL  02/22/2019   IR TUNNELED CENTRAL VENOUS CATH PLC W IMG  02/21/2024   IR US  GUIDE VASC ACCESS RIGHT  12/05/2018   IR US  GUIDE VASC ACCESS RIGHT  12/13/2018   LAPAROSCOPIC APPENDECTOMY N/A 12/04/2019   Procedure: APPENDECTOMY LAPAROSCOPIC;  Surgeon: Belinda Cough, MD;  Location: [facility] OR;  Service: General;  Laterality: N/A;   LAPAROSCOPIC LYSIS OF ADHESIONS N/A 10/21/2023   Procedure: LYSIS, ADHESIONS, LAPAROSCOPIC;  Surgeon: Magda Debby SAILOR, MD;  Location: [facility] OR;  Service: Vascular;  Laterality: N/A;   LAPAROSCOPY N/A 10/21/2023   Procedure: LAPAROSCOPY, DIAGNOSTIC;  Surgeon: Magda Debby SAILOR, MD;  Location: [facility] OR;  Service: Vascular;  Laterality: N/A;   TUBAL LIGATION      Family History  Problem Relation Age of Onset   Hypertension Mother    Cancer Mother        Uterine vs cervical (pt unsure),    Schizophrenia Brother    Breast cancer Neg Hx     Social History   Socioeconomic History   Marital status: Married    Spouse name: Not on file   Number of children: Not on file   Years of education: Not on file   Highest education level: Not on file  Occupational History   Not on file  Tobacco Use   Smoking status: Never    Passive exposure: Never  Smokeless tobacco: Never  Vaping Use   Vaping status: Never Used  Substance and Sexual Activity   Alcohol use: No   Drug use: No   Sexual activity: Yes    Birth control/protection: Surgical    Comment: tubial ligation  Other Topics Concern   Not on file  Social History Narrative   Not on file   Social Drivers of Health   Financial Resource Strain: Low Risk  (10/06/2022)   Received from [facility]   Overall Financial Resource Strain (CARDIA)    Difficulty of Paying Living Expenses: Not hard at all  Food Insecurity: No Food Insecurity (02/19/2024)   Hunger Vital Sign     Worried About Running Out of Food in the Last Year: Never true    Ran Out of Food in the Last Year: Never true  Transportation Needs: No Transportation Needs (02/19/2024)   PRAPARE - Administrator, Civil Service (Medical): No    Lack of Transportation (Non-Medical): No  Physical Activity: Not on file  Stress: Not on file  Social Connections: Not on file  Intimate Partner Violence: Not At Risk (02/19/2024)   Humiliation, Afraid, Rape, and Kick questionnaire    Fear of Current or Ex-Partner: No    Emotionally Abused: No    Physically Abused: No    Sexually Abused: No    Allergies  Allergen Reactions   Morphine Itching and Other (See Comments)    Hallucinations, also  Has tolerated percocet per patient and based on fill hx    Peanut Oil Swelling and Other (See Comments)    Tongue swelling as a child Can eat peanuts    Sulfamethoxazole-Trimethoprim Hives and Other (See Comments)   Covid-19 (Mrna) Vaccine Other (See Comments)    Was told to not take this again   Trulicity [Dulaglutide] Other (See Comments)    She is DM Type 1. Was prescribed but damaged her kidneys    Current Facility-Administered Medications  Medication Dose Route Frequency Provider Last Rate Last Admin   0.9 %  sodium chloride  infusion   Intravenous Continuous Magda Debby SAILOR, MD       0.9 %  sodium chloride  infusion   Intravenous Continuous Leopoldo Bruckner, MD 10 mL/hr at 03/29/24 1033 New Bag at 03/29/24 1033   ceFAZolin  (ANCEF ) IVPB 2g/100 mL premix  2 g Intravenous 30 min Pre-Op  Magda Debby SAILOR, MD       chlorhexidine  (HIBICLENS ) 4 % liquid 4 Application  60 mL Topical Once Arcadio Cope N, MD       Facility-Administered Medications Ordered in Other Encounters  Medication Dose Route Frequency Provider Last Rate Last Admin   sodium chloride  flush (NS) 0.9 % injection 10 mL  10 mL Intravenous PRN Brahmanday, Govinda R, MD        PHYSICAL EXAM Vitals:   03/22/24 1243 03/29/24 1004   BP:  (!) 143/81  Pulse:  92  Resp:  18  Temp:  98.4 F (36.9 C)  TempSrc:  Oral  SpO2:  100%  Weight: 78.3 kg 78.9 kg  Height: 5' 7 (1.702 m) 5' 7 (1.702 m)   No acute distress Regular rate and rhythm Unlabored breathing PD catheter placed   PERTINENT LABORATORY AND RADIOLOGIC DATA  Most recent CBC    Latest Ref Rng & Units 03/29/2024   10:38 AM 02/22/2024    9:48 AM 02/20/2024    2:14 AM  CBC  WBC 4.0 - 10.5 K/uL  8.2  12.8  Hemoglobin 12.0 - 15.0 g/dL 89.7  9.0  9.4   Hematocrit 36.0 - 46.0 % 30.0  27.8  28.1   Platelets 150 - 400 K/uL  237  283      Most recent CMP    Latest Ref Rng & Units 03/29/2024   10:38 AM 02/22/2024    4:17 AM 02/21/2024    4:23 AM  CMP  Glucose 70 - 99 mg/dL 797  876  716   BUN 6 - 20 mg/dL 28  30  54   Creatinine 0.44 - 1.00 mg/dL 2.49  4.00  1.22   Sodium 135 - 145 mmol/L 135  133  136   Potassium 3.5 - 5.1 mmol/L 3.1  4.1  4.1   Chloride 98 - 111 mmol/L 96  96  97   CO2 22 - 32 mmol/L  27  25   Calcium  8.9 - 10.3 mg/dL  7.0  6.3     Renal function Estimated Creatinine Clearance: 10.8 mL/min (A) (by C-G formula based on SCr of 7.5 mg/dL (H)).  Hemoglobin A1C (%)  Date Value  12/25/2013 8.0 (H)   Hgb A1c MFr Bld (%)  Date Value  03/21/2020 6.8 (H)    LDL Chol Calc (NIH)  Date Value Ref Range Status  11/16/2019 124 (H) 0 - 99 mg/dL Final    Debby SAILOR. Magda, MD FACS [facility] Phone Number: [phone] 03/29/2024 11:24 AM   Total time spent on preparing this encounter including chart review, data review, collecting history, examining the patient, and coordinating care: 30 minutes.   Portions of this report may have been transcribed using voice recognition software.  Every effort has been made to ensure accuracy; however, inadvertent computerized transcription errors may still be present.

## 2024-03-29 NOTE — Op Note (Signed)
 DATE OF SERVICE: 03/29/2024  PATIENT:  Kelly Huff  38 y.o. female  PRE-OPERATIVE DIAGNOSIS: Nonfunctional peritoneal dialysis catheter  POST-OPERATIVE DIAGNOSIS:  Same  PROCEDURE:   Removal of peritoneal dialysis catheter  SURGEON:  Surgeons and Role:    * Magda Debby SAILOR, MD - Primary  ASSISTANT: Norman, PA-S  An experienced assistant was required given the complexity of this procedure and the standard of surgical care. My assistant helped with exposure through counter tension, suctioning, ligation and retraction to better visualize the surgical field.  My assistant expedited sewing during the case by following my sutures. Wherever I use the term we in the report, my assistant actively helped me with that portion of the procedure.  ANESTHESIA:   general  EBL: Minimal  BLOOD ADMINISTERED:none  DRAINS: none   LOCAL MEDICATIONS USED:  NONE  SPECIMEN:  none  COUNTS: confirmed correct.  TOURNIQUET:  none  PATIENT DISPOSITION:  PACU - hemodynamically stable.   Delay start of Pharmacological VTE agent (>24hrs) due to surgical blood loss or risk of bleeding: no  INDICATION FOR PROCEDURE: [name] is a 38 y.o. female with nonfunctioning peritoneal dialysis catheter. After careful discussion of risks, benefits, and alternatives the patient was offered PD catheter removal. The patient understood and wished to proceed.  OPERATIVE FINDINGS: Successful removal peritoneal dialysis catheter.  The distal end of the catheter did appear to be clogged with fibrinous material.  Catheter tip sent for culture.  DESCRIPTION OF PROCEDURE: After identification of the patient in the pre-operative holding area, the patient was transferred to the operating room. The patient was positioned supine on the operating room table. Anesthesia was induced. The abdomen was prepped and draped in standard fashion. A surgical pause was performed confirming correct patient, procedure, and operative  location.  Course of the peritoneal dialysis catheter was mapped on the skin using intraoperative ultrasound.  The cuffs were identified and marks were made in the skin overlying the cuffs.  2 transverse incisions were used to expose the cuffs.  The rectus sheath cuff was exposed first and freed.  This delivered the intra-abdominal portion of the catheter onto the abdominal wall.  I then made a separate transverse incision to expose the 2 swan-neck cuffs.  These were dissected free with Bovie electrocautery.  Once the catheter was freed.  The catheter was cut and removed from the abdominal wall.  I confirmed all pieces of the catheter were removed.  The wounds were closed in layers using 3-0 Vicryl and 4-0 Monocryl.  Upon completion of the case instrument and sharps counts were confirmed correct. The patient was transferred to the PACU in good condition. I was present for all portions of the procedure.  FOLLOW UP PLAN: Follow-up in 1 month with vein mapping for permanent dialysis access creation.  Debby SAILOR. Magda, MD [facility] Phone Number: [phone] 03/29/2024 12:53 PM

## 2024-03-29 NOTE — Anesthesia Preprocedure Evaluation (Addendum)
 Anesthesia Evaluation  Patient identified by MRN, date of birth, ID band Patient awake    Reviewed: Allergy & Precautions, NPO status , Patient's Chart, lab work & pertinent test results  History of Anesthesia Complications (+) PONV and history of anesthetic complications  Airway Mallampati: II  TM Distance: >3 FB Neck ROM: Full    Dental  (+) Teeth Intact, Dental Advisory Given   Pulmonary neg shortness of breath, neg sleep apnea, neg COPD, neg recent URI   breath sounds clear to auscultation       Cardiovascular hypertension, Pt. on medications  Rhythm:Regular     Neuro/Psych  Headaches, neg Seizures    GI/Hepatic negative GI ROS, Neg liver ROS,,,  Endo/Other  diabetes    Renal/GU ESRF and DialysisRenal diseaseLast hd yesterday  Lab Results      Component                Value               Date                      NA                       135                 03/29/2024                K                        3.1 (L)             03/29/2024                CO2                      27                  02/22/2024                GLUCOSE                  202 (H)             03/29/2024                BUN                      28 (H)              03/29/2024                CREATININE               7.50 (H)            03/29/2024                CALCIUM                   7.0 (L)             02/22/2024                GFRNONAA                 9 (L)               02/22/2024  Musculoskeletal negative musculoskeletal ROS (+)    Abdominal   Peds  Hematology  (+) Blood dyscrasia, anemia Lab Results      Component                Value               Date                      WBC                      8.2                 02/22/2024                HGB                      10.2 (L)            03/29/2024                HCT                      30.0 (L)            03/29/2024                MCV                      86.1                 02/22/2024                PLT                      237                 02/22/2024              Anesthesia Other Findings   Reproductive/Obstetrics Lab Results      Component                Value               Date                      PREGTESTUR               NEGATIVE            03/29/2024                PREGSERUM                NEGATIVE            02/18/2024                HCG                      <5.0                03/20/2020                                         Anesthesia Physical Anesthesia Plan  ASA: 3  Anesthesia Plan: General   Post-op Pain Management: Ofirmev  IV (intra-op)*   Induction: Intravenous  PONV Risk Score and Plan: 4 or greater and Ondansetron , Dexamethasone  and Scopolamine  patch - Pre-op   Airway Management Planned: Oral ETT  Additional Equipment: None  Intra-op Plan:   Post-operative Plan: Extubation in OR  Informed Consent: I have reviewed the patients History and Physical, chart, labs and discussed the procedure including the risks, benefits and alternatives for the proposed anesthesia with the patient or authorized representative who has indicated his/her understanding and acceptance.     Dental advisory given  Plan Discussed with: CRNA  Anesthesia Plan Comments:          Anesthesia Quick Evaluation

## 2024-03-29 NOTE — Transfer of Care (Signed)
 Immediate Anesthesia Transfer of Care Note  Patient: Kelly Huff  Procedure(s) Performed: REMOVAL, DIALYSIS CATHETER  Patient Location: PACU  Anesthesia Type:General  Level of Consciousness: awake and patient cooperative  Airway & Oxygen Therapy: Patient Spontanous Breathing and Patient connected to face mask oxygen  Post-op Assessment: Report given to RN and Post -op Vital signs reviewed and stable  Post vital signs: Reviewed and stable  Last Vitals:  Vitals Value Taken Time  BP 114/80 03/29/24 13:09  Temp    Pulse 86 03/29/24 13:13  Resp 16 03/29/24 13:13  SpO2 100 % 03/29/24 13:13  Vitals shown include unfiled device data.  Last Pain:  Vitals:   03/29/24 1022  TempSrc:   PainSc: 0-No pain         Complications: No notable events documented.

## 2024-03-29 NOTE — Anesthesia Procedure Notes (Signed)
 Procedure Name: Intubation Date/Time: 03/29/2024 12:12 PM  Performed by: Erlene Powell POUR, CRNAPre-anesthesia Checklist: Patient identified, Emergency Drugs available, Suction available and Patient being monitored Patient Re-evaluated:Patient Re-evaluated prior to induction Oxygen Delivery Method: Circle system utilized Preoxygenation: Pre-oxygenation with 100% oxygen Induction Type: IV induction Ventilation: Mask ventilation without difficulty Laryngoscope Size: Miller and 2 Grade View: Grade I Tube type: Oral Tube size: 7.0 mm Number of attempts: 1 Airway Equipment and Method: Stylet Placement Confirmation: ETT inserted through vocal cords under direct vision, positive ETCO2 and breath sounds checked- equal and bilateral Secured at: 21 cm Tube secured with: Tape Dental Injury: Teeth and Oropharynx as per pre-operative assessment

## 2024-03-29 NOTE — Progress Notes (Signed)
 Per Dr. Leopoldo, no pre-op  correction for glucose needed at this time since pt has an insulin  pump. Ordered to continue to monitor CBG until surgery.  Joesph LOISE Stake, RN

## 2024-03-30 ENCOUNTER — Other Ambulatory Visit: Payer: Self-pay

## 2024-03-30 ENCOUNTER — Other Ambulatory Visit: Payer: Self-pay | Admitting: *Deleted

## 2024-03-30 ENCOUNTER — Other Ambulatory Visit: Payer: Self-pay | Admitting: Vascular Surgery

## 2024-03-30 DIAGNOSIS — N186 End stage renal disease: Secondary | ICD-10-CM

## 2024-03-30 NOTE — Anesthesia Postprocedure Evaluation (Signed)
 Anesthesia Post Note  Patient: Kelly Huff  Procedure(s) Performed: REMOVAL, DIALYSIS CATHETER     Patient location during evaluation: PACU Anesthesia Type: General Level of consciousness: awake and alert Pain management: pain level controlled Vital Signs Assessment: post-procedure vital signs reviewed and stable Respiratory status: spontaneous breathing, nonlabored ventilation and respiratory function stable Cardiovascular status: blood pressure returned to baseline and stable Postop Assessment: no apparent nausea or vomiting Anesthetic complications: no   No notable events documented.                  Caedon Bond

## 2024-03-31 ENCOUNTER — Telehealth: Payer: Self-pay | Admitting: Vascular Surgery

## 2024-03-31 ENCOUNTER — Encounter (HOSPITAL_COMMUNITY): Payer: Self-pay | Admitting: Vascular Surgery

## 2024-03-31 NOTE — Telephone Encounter (Signed)
 Patient did not answer telephone. Left voicemail with patient to discuss catheter tip culture findings. Message sent to Dr. Gearline.  Debby SAILOR. Magda, MD [facility] Phone Number: [phone] 03/31/2024 2:34 PM

## 2024-04-01 LAB — CATH TIP CULTURE: Culture: >=100000 — AB

## 2024-04-05 ENCOUNTER — Telehealth (HOSPITAL_COMMUNITY): Payer: Self-pay | Admitting: Internal Medicine

## 2024-04-05 ENCOUNTER — Telehealth (HOSPITAL_COMMUNITY): Payer: Self-pay | Admitting: Pharmacy Technician

## 2024-04-05 NOTE — Telephone Encounter (Signed)
 Patient referred to infusion pharmacy team for ambulatory infusion of Ultomiris .  Insurance - [employer] Site of care - Site of care: [facility] Dx code - D59.39 Therapy - Last infusion was November 17, 2023. Spoke with provider, pt will resume therapy with loading dose followed by maintenance infusion every 8 weeks.   Infusion appointments - Scheduling team will schedule patient as soon as possible.    Thank you,  Norton Blush, PharmD, [facility] Pharmacist [facility]

## 2024-04-05 NOTE — Telephone Encounter (Signed)
 Auth Submission: APPROVED Site of care: [facility] Payer: [employer] MEDICARE Medication & CPT/J Code(s) submitted: G8696 - RAVULIZUMAB -CWVZ 10 MG (ULTIMORIS) Diagnosis Code: D59.39 Route of submission (phone, fax, portal): portal Phone # Fax # Auth type: Buy/Bill HB Units/visits requested: 2700mg  x 1, then 3300mg  every 8 weeks Reference number: [id number] Approval from: 04/05/24 to 04/05/25   [facility] NOTE: Last infusion was November 17, 2023. Spoke with provider, pt will resume therapy with loading dose followed by maintenance infusion every 8 weeks.   Noma Quijas, CPhT [facility] Phone: [phone] 04/05/2024

## 2024-04-11 ENCOUNTER — Telehealth (HOSPITAL_COMMUNITY): Payer: Self-pay | Admitting: Internal Medicine

## 2024-04-11 NOTE — Telephone Encounter (Signed)
 Patient referred to infusion pharmacy team for ambulatory infusion of Ultomiris .  Insurance - [employer], Manufacturing of care - Site of care: [facility] Dx code - D59.39 Therapy - Ultomiris  2,700 mg x 1 dose, followed by Ultomiris  3,300 mg every 8 weeks.  Infusion appointments - Scheduling team will schedule patient as soon as possible.         Thank you,  Norton Blush, PharmD, [facility] Pharmacist [facility]

## 2024-04-12 ENCOUNTER — Inpatient Hospital Stay (HOSPITAL_COMMUNITY)
Admission: RE | Admit: 2024-04-12 | Discharge: 2024-04-12 | Disposition: A | Source: Ambulatory Visit | Attending: Internal Medicine

## 2024-04-12 ENCOUNTER — Other Ambulatory Visit (HOSPITAL_COMMUNITY): Payer: Self-pay | Admitting: Internal Medicine

## 2024-04-12 VITALS — BP 173/97 | HR 98 | Temp 98.4°F | Resp 15

## 2024-04-12 DIAGNOSIS — D5939 Other hemolytic-uremic syndrome: Secondary | ICD-10-CM

## 2024-04-12 MED ORDER — SODIUM CHLORIDE 0.9 % IV SOLN: 2700.0000 mg | Freq: Once | INTRAVENOUS | Status: DC

## 2024-04-12 MED ORDER — RAVULIZUMAB-CWVZ 1100 MG/11ML IV SOLN
INTRAVENOUS | Status: DC
  Filled 2024-04-12: qty 270

## 2024-04-17 ENCOUNTER — Ambulatory Visit (HOSPITAL_COMMUNITY)
Admission: RE | Admit: 2024-04-17 | Discharge: 2024-04-17 | Disposition: A | Discharge status: Home / Self Care | Source: Ambulatory Visit | Attending: Internal Medicine | Admitting: Internal Medicine

## 2024-04-17 VITALS — BP 160/87 | HR 78 | Temp 98.3°F | Resp 16

## 2024-04-17 DIAGNOSIS — D5939 Other hemolytic-uremic syndrome: Secondary | ICD-10-CM | POA: Diagnosis present

## 2024-04-17 MED ORDER — SODIUM CHLORIDE 0.9 % IV SOLN: 2700.0000 mg | Freq: Once | INTRAVENOUS | Status: DC

## 2024-04-17 MED ORDER — SODIUM CHLORIDE 0.9 % IV SOLN
2700.0000 mg | Freq: Once | INTRAVENOUS | Status: AC
  Administered 2024-04-17: 2700 mg via INTRAVENOUS
  Filled 2024-04-17: qty 27

## 2024-04-17 NOTE — Addendum Note (Signed)
 Addended by: TANDA MILROY C on: 04/17/2024 01:44 PM   Modules accepted: Orders

## 2024-04-18 NOTE — Telephone Encounter (Signed)
 Provider changed maintenance dosing to every 12 weeks. Submitted new auth for dosing change.   Auth Submission: APPROVED Site of care: [facility] Payer: [employer] MEDICARE Medication & CPT/J Code(s) submitted: G8696 - ULTIMORIS (Ravulizumab -cwvz 10mg ) Diagnosis Code: D59.39 Route of submission (phone, fax, portal): portal Phone # Fax # Auth type: Buy/Bill HB Units/visits requested: maint dose: 3300 mg every 12 weeks (5 doses approved) Reference number: [id number] Approval from: 04/18/24 to 04/18/25    Kelly [facility], CPhT [facility] Phone: [phone] 04/18/2024

## 2024-04-25 ENCOUNTER — Ambulatory Visit: Admitting: Internal Medicine

## 2024-04-25 ENCOUNTER — Other Ambulatory Visit

## 2024-04-29 ENCOUNTER — Ambulatory Visit
Admission: RE | Admit: 2024-04-29 | Discharge: 2024-04-29 | Disposition: A | Discharge status: Home / Self Care | Source: Ambulatory Visit

## 2024-04-29 VITALS — BP 157/88 | HR 85 | Temp 98.2°F | Resp 17

## 2024-04-29 DIAGNOSIS — R591 Generalized enlarged lymph nodes: Secondary | ICD-10-CM

## 2024-04-29 HISTORY — DX: Dependence on renal dialysis: Z99.2

## 2024-04-29 MED ORDER — CEPHALEXIN 250 MG PO CAPS: 250.0000 mg | ORAL_CAPSULE | Freq: Every day | ORAL | 0 refills | Status: AC

## 2024-04-29 NOTE — ED Provider Notes (Signed)
 [facility]    CSN: [id number] Arrival date & time: 04/29/24  9178      History   Chief Complaint Chief Complaint  Patient presents with   Mass    HPI Kelly Huff is a 39 y.o. female.   Patient presents with concerns for lymphadenopathy to left side of neck.  Patient states that she noticed this about 2 or 3 days ago.  Patient denies any tenderness to this area.  Patient reports that she did apply warm compresses which seem to decrease the swelling some.  Patient reports that she did have flulike symptoms approximately 2 weeks ago but the symptoms have since resolved.  Patient reports that she does at home dialysis 4 times a week.  Patient states that she had some concerns with her dialysis treatment yesterday would like her hemoglobin checked just to be sure nothing went wrong with this.  Patient states that yesterday after dialysis she did feel a little weak and lightheaded.  Patient states that she recovered from this after a few hours and has not had any weakness, dizziness, lightheadedness, or shortness of breath today.  Patient denies any symptoms today other than concerns for lymphadenopathy.  The history is provided by the patient and medical records.    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   PONV (postoperative nausea and vomiting)     Patient Active Problem List   Diagnosis Date Noted   Peritoneal dialysis  catheter mechanical complication 02/19/2024   History of peritonitis 02/19/2024   Leukocytosis 02/19/2024   Anemia of chronic disease 02/19/2024   HUS (hemolytic uremic syndrome), atypical (HCC) 01/07/2024   Adrenal insufficiency 10/25/2023   Cataract 10/25/2023   Cataract associated with type 1 diabetes mellitus (HCC) 10/25/2023   Hyperglycemia due to type 1 diabetes mellitus (HCC) 10/25/2023   Type 1 diabetes mellitus (HCC) 10/25/2023   Hypertension 10/25/2023   Type 2 diabetes mellitus with diabetic chronic kidney disease (HCC) 09/03/2023   Acidosis, unspecified 05/14/2023   Liver disease, unspecified 05/14/2023   Other dietary vitamin B12 deficiency anemia 05/14/2023   Other disorders of electrolyte and fluid balance, not elsewhere classified 05/14/2023   Other disorders resulting from impaired renal tubular function 05/14/2023   Long term current use of insulin  (HCC) 05/14/2023   Coagulation defect, unspecified 02/19/2023   Moderate protein-calorie malnutrition 01/12/2023   Peritonsillitis 01/07/2023   Ovarian cyst 01/06/2023   Peritoneal dialysis-associated peritonitis 01/06/2023   Peritonitis, unspecified (HCC) 11/04/2022   Intractable abdominal pain 11/01/2022   SBP (spontaneous bacterial peritonitis) (HCC) 11/01/2022   Amenorrhea, unspecified 10/09/2022   Encounter for adequacy testing for peritoneal dialysis 09/15/2022   Lattice degeneration of peripheral retina, 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   History of CVA (cerebrovascular accident) 03/16/2022   Gastroparesis due to DM (HCC) 03/05/2022   Hypertension due to endocrine disorder 03/05/2022   Folic acid   deficiency 02/15/2022   Low serum vitamin B12 02/09/2022   Other disorders of plasma-protein metabolism, not elsewhere classified 05/23/2021   Anaphylactic shock, unspecified, initial encounter 05/08/2021   Hyperlipidemia, unspecified 05/08/2021   Allergy, unspecified, initial encounter  05/08/2021   Other disorders of phosphorus metabolism 05/08/2021   Altered mental status 03/11/2021   Headache 03/11/2021   History of hemolytic uremic syndrome 01/11/2021   Hyponatremia 03/20/2020   Hypoalbuminemia 03/20/2020   Abdominal pain 03/20/2020   Nausea & vomiting 03/20/2020   Diarrhea 03/20/2020   ESRD on peritoneal dialysis (HCC) 02/20/2020   COVID-19 virus infection 01/09/2020   Acute appendicitis 12/04/2019   Hyperkalemia 12/13/2018   Peritoneal dialysis catheter dysfunction 12/03/2018   PD catheter dysfunction 12/03/2018   Hypertensive urgency 09/03/2018   Enteritis 07/27/2018   ESRD on dialysis (HCC) 07/27/2018   Hypertension associated with diabetes (HCC) 07/27/2018   Uremic syndrome 07/18/2018   Pre-op  evaluation 06/30/2018   Hemolytic uremic syndrome (HCC) 01/03/2018   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) 05/26/2013    Past Surgical History:  Procedure Laterality Date   CAPD INSERTION N/A 04/26/2023   Procedure: LAPAROSCOPIC INSERTION CONTINUOUS AMBULATORY PERITONEAL DIALYSIS  (CAPD) CATHETER;  Surgeon: Magda Debby SAILOR, MD;  Location: [facility] OR;  Service: Vascular;  Laterality: N/A;   CAPD REMOVAL N/A 02/22/2023   Procedure: REMOVAL CONTINUOUS AMBULATORY PERITONEAL DIALYSIS (CAPD) CATHETER;  Surgeon: Magda Debby SAILOR, MD;  Location: [facility] OR;  Service: Vascular;  Laterality: N/A;   CESAREAN SECTION     x 1   DILATION AND CURETTAGE OF UTERUS     x 4   INSERTION OF DIALYSIS CATHETER Right 02/22/2023   Procedure: INSERTION OF TUNNELED DIALYSIS CATHETER;  Surgeon: Magda Debby SAILOR, MD;  Location: [facility] OR;  Service: Vascular;  Laterality: Right;   IR FLUORO GUIDE CV LINE RIGHT  12/05/2018    IR FLUORO GUIDE CV LINE RIGHT  12/13/2018   IR REMOVAL TUN CV CATH W/O FL  02/22/2019   IR TUNNELED CENTRAL VENOUS CATH PLC W IMG  02/21/2024   IR US  GUIDE VASC ACCESS RIGHT  12/05/2018   IR US  GUIDE VASC ACCESS RIGHT  12/13/2018   LAPAROSCOPIC APPENDECTOMY N/A 12/04/2019   Procedure: APPENDECTOMY LAPAROSCOPIC;  Surgeon: Belinda Cough, MD;  Location: [facility] OR;  Service: General;  Laterality: N/A;   LAPAROSCOPIC LYSIS OF ADHESIONS N/A 10/21/2023   Procedure: LYSIS, ADHESIONS, LAPAROSCOPIC;  Surgeon: Magda Debby SAILOR, MD;  Location: [facility];  Service: Vascular;  Laterality: N/A;   LAPAROSCOPY N/A 10/21/2023   Procedure: LAPAROSCOPY, DIAGNOSTIC;  Surgeon: Magda Debby SAILOR, MD;  Location: [facility];  Service: Vascular;  Laterality: N/A;   REMOVAL OF A DIALYSIS CATHETER N/A 03/29/2024   Procedure: REMOVAL, DIALYSIS CATHETER;  Surgeon: Magda Debby SAILOR, MD;  Location: [facility];  Service: Vascular;  Laterality: N/A;   TUBAL LIGATION      OB History     Gravida  5   Para  3   Term  2   Preterm  1   AB  2   Living  3      SAB      IAB  0   Ectopic      Multiple  Live Births  3            Home Medications    Prior to Admission medications  Medication Sig Start Date End Date Taking? Authorizing Provider  amLODipine  (NORVASC ) 10 MG tablet Take 10 mg by mouth.   Yes [provider]  AURYXIA 1 GM 210 MG(Fe) tablet Take 630 mg by mouth See admin instructions. Take 630 mg by mouth three times a day with meals and 210 mg with each snack   Yes [provider]  calcitRIOL  (ROCALTROL ) 0.25 MCG capsule Take 0.25 mcg by mouth daily. 09/30/23  Yes [provider]  cephALEXin  (KEFLEX ) 250 MG capsule Take 1 capsule (250 mg total) by mouth daily for 7 days. 04/29/24 05/06/24 Yes Johnie Flaming A, NP  CHLOROPHYLL PO Take by mouth.   Yes [provider]  Continuous Glucose Sensor (DEXCOM G6 SENSOR) MISC Inject 1 Device into the skin See admin instructions. Place  1 new device into the skin every 10 days   Yes [provider]  cyclobenzaprine  (FLEXERIL ) 10 MG tablet Take 10 mg by mouth 3 (three) times daily as needed for muscle spasms.   Yes [provider]  Insulin  Disposable Pump (OMNIPOD 5 G6 PODS, GEN 5,) MISC Inject 1 Device into the skin every 3 (three) days.   Yes [provider]  insulin  lispro (HUMALOG ) 100 UNIT/ML injection Continue use of insulin  pump with setting as previous: Restart in the morning of 03/23/2020 as Lantus  was given on the morning of 03/22/2020. 03/22/20  Yes Swayze, Ava, DO  metoprolol  succinate (TOPROL -XL) 100 MG 24 hr tablet Take 100 mg by mouth daily. 08/10/23  Yes [provider]  traZODone  (DESYREL ) 50 MG tablet Take 50 mg by mouth at bedtime as needed for sleep.   Yes [provider]    Family History Family History  Problem Relation Age of Onset   Hypertension Mother    Cancer Mother        Uterine vs cervical (pt unsure),    Schizophrenia Brother    Breast cancer Neg Hx     Social History Social History[1]   Allergies   Morphine, Peanut oil, Sulfamethoxazole-trimethoprim, Covid-19 (mrna) vaccine, and Trulicity [dulaglutide]   Review of Systems Review of Systems  Per HPI  Physical Exam Triage Vital Signs ED Triage Vitals  Encounter Vitals Group     BP 04/29/24 0830 (!) 157/88     Girls Systolic BP Percentile --      Girls Diastolic BP Percentile --      Boys Systolic BP Percentile --      Boys Diastolic BP Percentile --      Pulse Rate 04/29/24 0830 85     Resp 04/29/24 0830 17     Temp 04/29/24 0830 98.2 F (36.8 C)     Temp Source 04/29/24 0830 Oral     SpO2 04/29/24 0830 99 %     Weight --      Height --      Head Circumference --      Peak Flow --      Pain Score 04/29/24 0837 1     Pain Loc --      Pain Education --      Exclude from Growth Chart --    No data found.  Updated Vital Signs BP (!) 157/88 (BP Location: Left Arm)   Pulse  85   Temp 98.2 F (36.8 C) (Oral)   Resp 17  LMP 04/03/2024 (Approximate)   SpO2 99%   Visual Acuity Right Eye Distance:   Left Eye Distance:   Bilateral Distance:    Right Eye Near:   Left Eye Near:    Bilateral Near:     Physical Exam Vitals and nursing note reviewed.  Constitutional:      General: She is awake. She is not in acute distress.    Appearance: Normal appearance. She is well-developed and well-groomed. She is not ill-appearing.  HENT:     Head: Normocephalic.     Right Ear: Tympanic membrane, ear canal and external ear normal.     Left Ear: Tympanic membrane, ear canal and external ear normal.     Nose: Nose normal.  Neck:      Comments: Left-sided superficial cervical adenopathy noted.  Nontender upon palpation. Cardiovascular:     Rate and Rhythm: Normal rate and regular rhythm.  Pulmonary:     Effort: Pulmonary effort is normal.     Breath sounds: Normal breath sounds.  Lymphadenopathy:     Cervical: Cervical adenopathy present.     Left cervical: Superficial cervical adenopathy present.  Skin:    General: Skin is warm and dry.  Neurological:     General: No focal deficit present.     Mental Status: She is alert and oriented to person, place, and time. Mental status is at baseline.  Psychiatric:        Behavior: Behavior is cooperative.      UC Treatments / Results  Labs (all labs ordered are listed, but only abnormal results are displayed) Labs Reviewed  CBC WITH DIFFERENTIAL/PLATELET  COMPREHENSIVE METABOLIC PANEL WITH GFR    EKG   Radiology No results found.  Procedures Procedures (including critical care time)  Medications Ordered in UC Medications - No data to display  Initial Impression / Assessment and Plan / UC Course  I have reviewed the triage vital signs and the nursing notes.  Pertinent labs & imaging results that were available during my care of the patient were reviewed by me and considered in my medical decision  making (see chart for details).     Patient is overall well-appearing.  Vitals are overall stable.  Left-sided superficial cervical adenopathy noted.  No other significant findings on exam.  Ordered CBC and CMP to rule out underlying infection, evaluate hemoglobin, and evaluate electrolytes.  Empirically treating for underlying infection due to presence of lymphadenopathy with cephalexin .  Calculated creatinine clearance of 15 and therefore prescribed cephalexin  is 250 mg once daily for 7 days to account for this.  Discussed follow-up, return, and strict ER precautions. Final Clinical Impressions(s) / UC Diagnoses   Final diagnoses:  Lymphadenopathy     Discharge Instructions      Start taking 1 capsule of cephalexin  once daily for 7 days to cover for infection related to presence of inflamed lymph node. We have drawn some labs today and someone will call once these have resulted if there are any concerning results. If you notice more lymph node swelling, the area becomes painful, you develop a fever, or severe weakness please go to the emergency department. Otherwise please call your primary care provider first thing Monday morning to discuss this visit and concerns with them.   ED Prescriptions     Medication Sig Dispense Auth. Provider   cephALEXin  (KEFLEX ) 250 MG capsule Take 1 capsule (250 mg total) by mouth daily for 7 days. 7 capsule Johnie Rumaldo LABOR, NP  PDMP not reviewed this encounter.    [1]  Social History Tobacco Use   Smoking status: Never    Passive exposure: Never   Smokeless tobacco: Never  Vaping Use   Vaping status: Never Used  Substance Use Topics   Alcohol use: No   Drug use: No     Johnie Rumaldo LABOR, NP 04/29/24 0912  "

## 2024-04-29 NOTE — Discharge Instructions (Signed)
 Start taking 1 capsule of cephalexin  once daily for 7 days to cover for infection related to presence of inflamed lymph node. We have drawn some labs today and someone will call once these have resulted if there are any concerning results. If you notice more lymph node swelling, the area becomes painful, you develop a fever, or severe weakness please go to the emergency department. Otherwise please call your primary care provider first thing Monday morning to discuss this visit and concerns with them.

## 2024-04-29 NOTE — ED Triage Notes (Signed)
 States started 2-3 days ago with enlarged lump to left lateral neck. Has been applying warm compresses. States had flu-like sxs approx 2 wks ago  with resolution of sxs.  Pt also requesting Hgb level to be drawn -- states she is not due to have labs drawn for another couple weeks. States had home dialysis yesterday.

## 2024-04-30 LAB — COMPREHENSIVE METABOLIC PANEL WITH GFR
ALT: 12 IU/L (ref 0–32)
AST: 14 IU/L (ref 0–40)
Albumin: 4.2 g/dL (ref 3.9–4.9)
Alkaline Phosphatase: 262 IU/L — ABNORMAL HIGH (ref 41–116)
BUN/Creatinine Ratio: 3 — ABNORMAL LOW (ref 9–23)
BUN: 14 mg/dL (ref 6–24)
Bilirubin Total: 0.3 mg/dL (ref 0.0–1.2)
CO2: 29 mmol/L (ref 20–29)
Calcium: 9.2 mg/dL (ref 8.7–10.2)
Chloride: 91 mmol/L — ABNORMAL LOW (ref 96–106)
Creatinine, Ser: 4.73 mg/dL — ABNORMAL HIGH (ref 0.57–1.00)
Globulin, Total: 3.1 g/dL (ref 1.5–4.5)
Glucose: 198 mg/dL — ABNORMAL HIGH (ref 70–99)
Potassium: 3.4 mmol/L — ABNORMAL LOW (ref 3.5–5.2)
Sodium: 132 mmol/L — ABNORMAL LOW (ref 134–144)
Total Protein: 7.3 g/dL (ref 6.0–8.5)
eGFR: 11 mL/min/1.73 — ABNORMAL LOW

## 2024-04-30 LAB — CBC WITH DIFFERENTIAL/PLATELET
Basophils Absolute: 0.1 x10E3/uL (ref 0.0–0.2)
Basos: 1 %
EOS (ABSOLUTE): 0.4 x10E3/uL (ref 0.0–0.4)
Eos: 7 %
Hematocrit: 32.5 % — ABNORMAL LOW (ref 34.0–46.6)
Hemoglobin: 10.5 g/dL — ABNORMAL LOW (ref 11.1–15.9)
Immature Grans (Abs): 0 x10E3/uL (ref 0.0–0.1)
Immature Granulocytes: 0 %
Lymphocytes Absolute: 1.1 x10E3/uL (ref 0.7–3.1)
Lymphs: 19 %
MCH: 28.5 pg (ref 26.6–33.0)
MCHC: 32.3 g/dL (ref 31.5–35.7)
MCV: 88 fL (ref 79–97)
Monocytes Absolute: 0.6 x10E3/uL (ref 0.1–0.9)
Monocytes: 10 %
Neutrophils Absolute: 3.5 x10E3/uL (ref 1.4–7.0)
Neutrophils: 63 %
Platelets: 310 x10E3/uL (ref 150–450)
RBC: 3.69 x10E6/uL — ABNORMAL LOW (ref 3.77–5.28)
RDW: 14.5 % (ref 11.7–15.4)
WBC: 5.5 x10E3/uL (ref 3.4–10.8)

## 2024-05-01 ENCOUNTER — Ambulatory Visit (HOSPITAL_COMMUNITY): Payer: Self-pay

## 2024-05-02 ENCOUNTER — Encounter: Admitting: Vascular Surgery

## 2024-05-02 ENCOUNTER — Ambulatory Visit (HOSPITAL_COMMUNITY)

## 2024-05-03 ENCOUNTER — Inpatient Hospital Stay (HOSPITAL_COMMUNITY): Admission: RE | Admit: 2024-05-03 | Source: Ambulatory Visit

## 2024-05-08 ENCOUNTER — Ambulatory Visit (HOSPITAL_COMMUNITY)
Admission: RE | Admit: 2024-05-08 | Discharge: 2024-05-08 | Disposition: A | Discharge status: Home / Self Care | Source: Ambulatory Visit | Attending: Internal Medicine | Admitting: Internal Medicine

## 2024-05-08 VITALS — BP 171/84 | HR 77 | Temp 98.2°F | Resp 16 | Wt 182.5 lb

## 2024-05-08 DIAGNOSIS — D5939 Other hemolytic-uremic syndrome: Secondary | ICD-10-CM | POA: Insufficient documentation

## 2024-05-08 MED ORDER — SODIUM CHLORIDE 0.9 % IV SOLN
3300.0000 mg | Freq: Once | INTRAVENOUS | Status: AC
  Administered 2024-05-08: 3300 mg via INTRAVENOUS
  Filled 2024-05-08: qty 33

## 2024-05-18 ENCOUNTER — Other Ambulatory Visit: Payer: Self-pay | Admitting: *Deleted

## 2024-05-18 ENCOUNTER — Encounter: Payer: Self-pay | Admitting: *Deleted

## 2024-05-18 DIAGNOSIS — D593 Hemolytic-uremic syndrome, unspecified: Secondary | ICD-10-CM

## 2024-05-19 ENCOUNTER — Inpatient Hospital Stay (HOSPITAL_COMMUNITY): Admission: RE | Admit: 2024-05-19 | Source: Ambulatory Visit

## 2024-05-23 ENCOUNTER — Inpatient Hospital Stay

## 2024-05-23 ENCOUNTER — Inpatient Hospital Stay: Admitting: Internal Medicine

## 2024-05-31 ENCOUNTER — Inpatient Hospital Stay: Admitting: Internal Medicine

## 2024-05-31 ENCOUNTER — Inpatient Hospital Stay

## 2024-05-31 NOTE — Assessment & Plan Note (Signed)
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  Stable [see below]  Platelets/LDH are normal.  Continue Ultimoris infusions every 12 weeks [[facility]]; continue indefinitely at this time.  I reviewed with the patient the importance of indefinite therapy. Stable.   # Anemia- multifactorial- 9-10 [do not suspect secondary to atypical HUS]; likely secondary to ESRD  on procrit /Iron ;  Today-awaiting iron  studies/ferritin; haptoglobin today.  # ESRD- on PD-  stable [awaiting transplant list- [facility]].   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   Infusion 7/22  # DISPOSITION: # infusion in [place]- on July 22nd as- scheduled # please schedule the infusion in [place] 12 weeks from July 22nd, 2025- # follow up in 12  weeks-- MD: labs- cbc/cmp; LDH; haptoglobin; iron  studies;ferritin--Dr.B

## 2024-06-05 ENCOUNTER — Ambulatory Visit: Admitting: Dermatology

## 2024-06-05 ENCOUNTER — Inpatient Hospital Stay: Admitting: Internal Medicine

## 2024-06-05 ENCOUNTER — Inpatient Hospital Stay

## 2024-06-07 ENCOUNTER — Encounter (HOSPITAL_COMMUNITY)

## 2024-06-09 ENCOUNTER — Encounter (HOSPITAL_COMMUNITY)

## 2024-06-28 ENCOUNTER — Encounter (HOSPITAL_COMMUNITY)

## 2024-07-26 ENCOUNTER — Encounter (HOSPITAL_COMMUNITY)
